# Patient Record
Sex: Male | Born: 1960 | Race: Black or African American | Hispanic: No | Marital: Married | Smoking: Current every day smoker
Health system: Southern US, Community
[De-identification: ages and names within clinical notes are randomized; demographics above are authoritative.]

## PROBLEM LIST (undated history)

## (undated) DIAGNOSIS — F32A Depression, unspecified: Secondary | ICD-10-CM

## (undated) DIAGNOSIS — H269 Unspecified cataract: Secondary | ICD-10-CM

## (undated) DIAGNOSIS — E11319 Type 2 diabetes mellitus with unspecified diabetic retinopathy without macular edema: Secondary | ICD-10-CM

## (undated) DIAGNOSIS — E119 Type 2 diabetes mellitus without complications: Secondary | ICD-10-CM

## (undated) DIAGNOSIS — F4310 Post-traumatic stress disorder, unspecified: Secondary | ICD-10-CM

## (undated) DIAGNOSIS — F419 Anxiety disorder, unspecified: Secondary | ICD-10-CM

## (undated) DIAGNOSIS — I509 Heart failure, unspecified: Secondary | ICD-10-CM

## (undated) DIAGNOSIS — M549 Dorsalgia, unspecified: Secondary | ICD-10-CM

## (undated) DIAGNOSIS — K859 Acute pancreatitis without necrosis or infection, unspecified: Secondary | ICD-10-CM

## (undated) DIAGNOSIS — B1920 Unspecified viral hepatitis C without hepatic coma: Secondary | ICD-10-CM

## (undated) DIAGNOSIS — I10 Essential (primary) hypertension: Secondary | ICD-10-CM

## (undated) DIAGNOSIS — I739 Peripheral vascular disease, unspecified: Secondary | ICD-10-CM

## (undated) DIAGNOSIS — E785 Hyperlipidemia, unspecified: Secondary | ICD-10-CM

## (undated) DIAGNOSIS — Z89511 Acquired absence of right leg below knee: Secondary | ICD-10-CM

## (undated) HISTORY — PX: CORONARY ANGIOPLASTY WITH STENT PLACEMENT: SHX49

---

## 2009-05-26 ENCOUNTER — Emergency Department (HOSPITAL_COMMUNITY): Admission: EM | Admit: 2009-05-26 | Discharge: 2009-05-26 | Payer: Self-pay | Admitting: Emergency Medicine

## 2013-08-04 ENCOUNTER — Emergency Department (HOSPITAL_BASED_OUTPATIENT_CLINIC_OR_DEPARTMENT_OTHER): Payer: Non-veteran care

## 2013-08-04 ENCOUNTER — Encounter (HOSPITAL_BASED_OUTPATIENT_CLINIC_OR_DEPARTMENT_OTHER): Payer: Self-pay | Admitting: Emergency Medicine

## 2013-08-04 ENCOUNTER — Emergency Department (HOSPITAL_BASED_OUTPATIENT_CLINIC_OR_DEPARTMENT_OTHER)
Admission: EM | Admit: 2013-08-04 | Discharge: 2013-08-04 | Disposition: A | Discharge status: Home / Self Care | Payer: Non-veteran care | Attending: Emergency Medicine | Admitting: Emergency Medicine

## 2013-08-04 DIAGNOSIS — R05 Cough: Secondary | ICD-10-CM | POA: Insufficient documentation

## 2013-08-04 DIAGNOSIS — Z794 Long term (current) use of insulin: Secondary | ICD-10-CM | POA: Insufficient documentation

## 2013-08-04 DIAGNOSIS — Z9861 Coronary angioplasty status: Secondary | ICD-10-CM | POA: Insufficient documentation

## 2013-08-04 DIAGNOSIS — R079 Chest pain, unspecified: Secondary | ICD-10-CM

## 2013-08-04 DIAGNOSIS — Z87891 Personal history of nicotine dependence: Secondary | ICD-10-CM | POA: Insufficient documentation

## 2013-08-04 DIAGNOSIS — Z8709 Personal history of other diseases of the respiratory system: Secondary | ICD-10-CM | POA: Insufficient documentation

## 2013-08-04 DIAGNOSIS — E663 Overweight: Secondary | ICD-10-CM | POA: Insufficient documentation

## 2013-08-04 DIAGNOSIS — R059 Cough, unspecified: Secondary | ICD-10-CM | POA: Insufficient documentation

## 2013-08-04 DIAGNOSIS — Z79899 Other long term (current) drug therapy: Secondary | ICD-10-CM | POA: Insufficient documentation

## 2013-08-04 DIAGNOSIS — Z7982 Long term (current) use of aspirin: Secondary | ICD-10-CM | POA: Insufficient documentation

## 2013-08-04 DIAGNOSIS — R071 Chest pain on breathing: Secondary | ICD-10-CM | POA: Insufficient documentation

## 2013-08-04 DIAGNOSIS — I2510 Atherosclerotic heart disease of native coronary artery without angina pectoris: Secondary | ICD-10-CM | POA: Insufficient documentation

## 2013-08-04 DIAGNOSIS — E119 Type 2 diabetes mellitus without complications: Secondary | ICD-10-CM | POA: Insufficient documentation

## 2013-08-04 HISTORY — DX: Type 2 diabetes mellitus without complications: E11.9

## 2013-08-04 LAB — BASIC METABOLIC PANEL
BUN: 10 mg/dL (ref 6–23)
CALCIUM: 9.6 mg/dL (ref 8.4–10.5)
CO2: 22 mEq/L (ref 19–32)
CREATININE: 0.7 mg/dL (ref 0.50–1.35)
Chloride: 105 mEq/L (ref 96–112)
GFR calc Af Amer: >90 mL/min (ref >90)
Glucose, Bld: 156 mg/dL — ABNORMAL HIGH (ref 70–99)
POTASSIUM: 4.1 meq/L (ref 3.7–5.3)
SODIUM: 142 meq/L (ref 137–147)

## 2013-08-04 LAB — CBC WITH DIFFERENTIAL/PLATELET
BASOS ABS: 0 10*3/uL (ref 0.0–0.1)
BASOS PCT: 1 % (ref 0–1)
EOS ABS: 0.2 10*3/uL (ref 0.0–0.7)
EOS PCT: 4 % (ref 0–5)
HCT: 47.7 % (ref 39.0–52.0)
Hemoglobin: 16.9 g/dL (ref 13.0–17.0)
Lymphocytes Relative: 47 % — ABNORMAL HIGH (ref 12–46)
Lymphs Abs: 3.1 10*3/uL (ref 0.7–4.0)
MCH: 33.5 pg (ref 26.0–34.0)
MCHC: 35.4 g/dL (ref 30.0–36.0)
MCV: 94.5 fL (ref 78.0–100.0)
MONO ABS: 0.5 10*3/uL (ref 0.1–1.0)
MONOS PCT: 8 % (ref 3–12)
Neutro Abs: 2.7 10*3/uL (ref 1.7–7.7)
Neutrophils Relative %: 42 % — ABNORMAL LOW (ref 43–77)
Platelets: 158 10*3/uL (ref 150–400)
RBC: 5.05 MIL/uL (ref 4.22–5.81)
RDW: 13.2 % (ref 11.5–15.5)
WBC: 6.6 10*3/uL (ref 4.0–10.5)

## 2013-08-04 LAB — TROPONIN I
Troponin I: <0.3 ng/mL (ref <0.30)
Troponin I: <0.3 ng/mL (ref <0.30)

## 2013-08-04 MED ORDER — IBUPROFEN 600 MG PO TABS: 600.0000 mg | ORAL_TABLET | Freq: Four times a day (QID) | ORAL | Status: DC | PRN

## 2013-08-04 MED ORDER — HYDROCODONE-ACETAMINOPHEN 5-325 MG PO TABS: 1.0000 | ORAL_TABLET | Freq: Four times a day (QID) | ORAL | Status: DC | PRN

## 2013-08-04 MED ORDER — ASPIRIN 81 MG PO CHEW
324.0000 mg | CHEWABLE_TABLET | Freq: Once | ORAL | Status: AC
  Administered 2013-08-04: 324 mg via ORAL
  Filled 2013-08-04: qty 4

## 2013-08-04 MED ORDER — KETOROLAC TROMETHAMINE 30 MG/ML IJ SOLN
30.0000 mg | Freq: Once | INTRAMUSCULAR | Status: AC
  Administered 2013-08-04: 30 mg via INTRAVENOUS
  Filled 2013-08-04: qty 1

## 2013-08-04 NOTE — ED Provider Notes (Signed)
CSN: [id number]     Arrival date & time 08/04/13  1540 History   First MD Initiated Contact with Patient 08/04/13 1609     Chief Complaint  Patient presents with  . Chest Pain     (Consider location/radiation/quality/duration/timing/severity/associated sxs/prior Treatment) HPI  This a [age] are old male with history of coronary artery disease status post stent placements x2, and diabetes who presents with chest pain. Patient reports 2-3 week history of constant anterior chest pain. Patient states that he was diagnosed with bronchitis and treated at the [facility]. He states that the chest pain is worse with movement, coughing, and laughing. There is no exertional component. He denies any shortness of breath.  Patient has not taken anything for the pain. He describes the pain as achy and nonradiating. Currently his pain is 4/10. He denies any history of blood clots or leg swelling.  Past Medical History  Diagnosis Date  . Diabetes mellitus without complication    Past Surgical History  Procedure Laterality Date  . Coronary angioplasty with stent placement     No family history on file. History  Substance Use Topics  . Smoking status: Former Games developermoker  . Smokeless tobacco: Not on file  . Alcohol Use: Yes    Review of Systems  Constitutional: Negative.  Negative for fever.  Respiratory: Positive for cough and chest tightness. Negative for shortness of breath.   Cardiovascular: Positive for chest pain. Negative for leg swelling.  Gastrointestinal: Negative.  Negative for nausea, vomiting and abdominal pain.  Genitourinary: Negative.  Negative for dysuria.  Musculoskeletal: Negative for back pain.  Neurological: Negative for headaches.  All other systems reviewed and are negative.     Allergies  Simvastatin  Home Medications   Prior to Admission medications   Medication Sig Start Date End Date Taking? Authorizing Provider  ALBUTEROL IN Inhale into the lungs.   Yes Historical Provider,  MD  aspirin 81 MG tablet Take 81 mg by mouth daily.   Yes Historical Provider, MD  BUPROPION HBR ER PO Take by mouth.   Yes Historical Provider, MD  CARVEDILOL PO Take by mouth.   Yes Historical Provider, MD  CHOLECALCIFEROL PO Take by mouth.   Yes Historical Provider, MD  HYDROcodone-acetaminophen (NORCO/VICODIN) 5-325 MG per tablet Take 1 tablet by mouth every 6 (six) hours as needed for moderate pain. 08/04/13   Shon Batonourtney F Horton, MD  ibuprofen (ADVIL,MOTRIN) 600 MG tablet Take 1 tablet (600 mg total) by mouth every 6 (six) hours as needed. 08/04/13   Shon Batonourtney F Horton, MD  insulin aspart (NOVOLOG) 100 UNIT/ML injection Inject into the skin 3 (three) times daily before meals.   Yes Historical Provider, MD  insulin glargine (LANTUS) 100 UNIT/ML injection Inject into the skin at bedtime.   Yes Historical Provider, MD  ketoconazole (NIZORAL) 2 % cream Apply 1 application topically daily.   Yes Historical Provider, MD  Ledipasvir-Sofosbuvir 90-400 MG TABS Take by mouth.   Yes Historical Provider, MD  lidocaine (XYLOCAINE) 5 % ointment Apply 1 application topically as needed.   Yes Historical Provider, MD  LOSARTAN POTASSIUM PO Take by mouth.   Yes Historical Provider, MD  MENTHOL-METHYL SALICYLATE EX Apply topically.   Yes Historical Provider, MD  METHOCARBAMOL PO Take by mouth.   Yes Historical Provider, MD  triamcinolone lotion (KENALOG) 0.1 % Apply 1 application topically 3 (three) times daily.   Yes Historical Provider, MD  ZOLPIDEM TARTRATE PO Take by mouth.   Yes Historical Provider, MD  BP 121/63  Pulse 85  Temp(Src) 98.4 F (36.9 C)  Resp 18  SpO2 96% Physical Exam  Nursing note and vitals reviewed. Constitutional: He is oriented to person, place, and time. He appears well-developed and well-nourished. No distress.  Overweight  HENT:  Head: Normocephalic and atraumatic.  Mouth/Throat: Oropharynx is clear and moist.  Eyes: Pupils are equal, round, and reactive to light.  Neck:  Neck supple. No JVD present.  Cardiovascular: Normal rate, regular rhythm and normal heart sounds.   No murmur heard. Pulmonary/Chest: Effort normal and breath sounds normal. No respiratory distress. He has no wheezes. He exhibits tenderness.  Tenderness to palpation over the anterior chest wall  Abdominal: Soft. Bowel sounds are normal. There is no tenderness. There is no rebound.  Musculoskeletal: He exhibits no edema.  Lymphadenopathy:    He has no cervical adenopathy.  Neurological: He is alert and oriented to person, place, and time.  Skin: Skin is warm and dry.  Psychiatric: He has a normal mood and affect.    ED Course  Procedures (including critical care time) Labs Review Labs Reviewed  CBC WITH DIFFERENTIAL - Abnormal; Notable for the following:    Neutrophils Relative % 42 (*)    Lymphocytes Relative 47 (*)    All other components within normal limits  BASIC METABOLIC PANEL - Abnormal; Notable for the following:    Glucose, Bld 156 (*)    All other components within normal limits  TROPONIN I  TROPONIN I    Imaging Review Dg Chest 2 View  08/04/2013   CLINICAL DATA:  Left chest pain. Upper extremity numbness and tingling.  EXAM: CHEST  2 VIEW  COMPARISON:  Multiple exams, including 06/29/2013  FINDINGS: Mildly enlarged cardiopericardial silhouette. Mild airway thickening. No airspace opacity observed. No pleural effusion.  IMPRESSION: 1. Airway thickening is present, suggesting bronchitis or reactive airways disease. 2. Mild cardiomegaly.   Electronically Signed   By: Herbie BaltimoreWalt  Liebkemann M.D.   On: 08/04/2013 17:04     EKG Interpretation   Date/Time:  Tuesday August 04 2013 15:48:28 EDT Ventricular Rate:  90 PR Interval:  130 QRS Duration: 89 QT Interval:  374 QTC Calculation: 458 R Axis:   77 Text Interpretation:  Sinus rhythm Minimal ST depression, inferior leads  no prior for comparison Confirmed by HORTON  MD, COURTNEY ([id number]) on  08/04/2013 4:27:55 PM       MDM   Final diagnoses:  Chest pain   Patient presents with chest pain. Duration 3 weeks and described as constant. Patient does have a history of coronary artery disease; however, story does not appear consistent with ACS. EKG shows no evidence of acute ischemia. Delta troponin is negative. Chest x-ray shows no evidence of pneumonia or pneumothorax. It does show airway thickening suggestive of bronchitis. Patient has no wheezing on exam. He reports recent recovery from bronchitis which could be consistent with this x-ray. Patient was given Toradol for pain. He has no lower extremity swelling and I have low suspicion for PE at this time given reproducible nature pain.  Vital signs remained reassuring. Discussed with patient use of anti-inflammatories for chest wall pain. Patient will be discharged with ibuprofen and Norco. Patient stated understanding. He is to followup closely with his primary care physician.  After history, exam, and medical workup I feel the patient has been appropriately medically screened and is safe for discharge home. Pertinent diagnoses were discussed with the patient. Patient was given return precautions.     Toni Amendourtney  Rexanne Mano, MD 08/04/13 [phone]

## 2013-08-04 NOTE — ED Notes (Signed)
C/o Cp x 2-3 weeks-[facility]-no tests and no meds

## 2013-08-04 NOTE — Discharge Instructions (Signed)
Chest Pain (Nonspecific) °It is often hard to give a specific diagnosis for the cause of chest pain. There is always a chance that your pain could be related to something serious, such as a heart attack or a blood clot in the lungs. You need to follow up with your caregiver for further evaluation. °CAUSES  °· Heartburn. °· Pneumonia or bronchitis. °· Anxiety or stress. °· Inflammation around your heart (pericarditis) or lung (pleuritis or pleurisy). °· A blood clot in the lung. °· A collapsed lung (pneumothorax). It can develop suddenly on its own (spontaneous pneumothorax) or from injury (trauma) to the chest. °· Shingles infection (herpes zoster virus). °The chest wall is composed of bones, muscles, and cartilage. Any of these can be the source of the pain. °· The bones can be bruised by injury. °· The muscles or cartilage can be strained by coughing or overwork. °· The cartilage can be affected by inflammation and become sore (costochondritis). °DIAGNOSIS  °Lab tests or other studies, such as X-rays, electrocardiography, stress testing, or cardiac imaging, may be needed to find the cause of your pain.  °TREATMENT  °· Treatment depends on what may be causing your chest pain. Treatment may include: °· Acid blockers for heartburn. °· Anti-inflammatory medicine. °· Pain medicine for inflammatory conditions. °· Antibiotics if an infection is present. °· You may be advised to change lifestyle habits. This includes stopping smoking and avoiding alcohol, caffeine, and chocolate. °· You may be advised to keep your head raised (elevated) when sleeping. This reduces the chance of acid going backward from your stomach into your esophagus. °· Most of the time, nonspecific chest pain will improve within 2 to 3 days with rest and mild pain medicine. °HOME CARE INSTRUCTIONS  °· If antibiotics were prescribed, take your antibiotics as directed. Finish them even if you start to feel better. °· For the next few days, avoid physical  activities that bring on chest pain. Continue physical activities as directed. °· Do not smoke. °· Avoid drinking alcohol. °· Only take over-the-counter or prescription medicine for pain, discomfort, or fever as directed by your caregiver. °· Follow your caregiver's suggestions for further testing if your chest pain does not go away. °· Keep any follow-up appointments you made. If you do not go to an appointment, you could develop lasting (chronic) problems with pain. If there is any problem keeping an appointment, you must call to reschedule. °SEEK MEDICAL CARE IF:  °· You think you are having problems from the medicine you are taking. Read your medicine instructions carefully. °· Your chest pain does not go away, even after treatment. °· You develop a rash with blisters on your chest. °SEEK IMMEDIATE MEDICAL CARE IF:  °· You have increased chest pain or pain that spreads to your arm, neck, jaw, back, or abdomen. °· You develop shortness of breath, an increasing cough, or you are coughing up blood. °· You have severe back or abdominal pain, feel nauseous, or vomit. °· You develop severe weakness, fainting, or chills. °· You have a fever. °THIS IS AN EMERGENCY. Do not wait to see if the pain will go away. Get medical help at once. Call your local emergency services (911 in [place] Do not drive yourself to the hospital. °MAKE SURE YOU:  °· Understand these instructions. °· Will watch your condition. °· Will get help right away if you are not doing well or get worse. °Document Released: 11/15/2004 Document Revised: 04/30/2011 Document Reviewed: 09/11/2007 °Exit[employer]® Patient Information ©2014 [employer]   [employer]. ° °

## 2019-04-20 ENCOUNTER — Ambulatory Visit: Payer: Non-veteran care | Attending: Internal Medicine

## 2019-04-20 DIAGNOSIS — Z23 Encounter for immunization: Secondary | ICD-10-CM | POA: Insufficient documentation

## 2019-04-20 NOTE — Progress Notes (Signed)
   Covid-19 Vaccination Clinic  Name:  Ricardo Howard    MRN: [id number] DOB: 03/23/1960  04/20/2019  [name] was observed post Covid-19 immunization for 15 minutes without incidence. He was provided with Vaccine Information Sheet and instruction to access the V-Safe system.   [name] was instructed to call 911 with any severe reactions post vaccine: Marland Kitchen Difficulty breathing  . Swelling of your face and throat  . A fast heartbeat  . A bad rash all over your body  . Dizziness and weakness    Immunizations Administered    Name Date Dose VIS Date Route   Pfizer COVID-19 Vaccine 04/20/2019  2:56 PM 0.3 mL 01/30/2019 Intramuscular   Manufacturer: [employer], [employer]   Lot: [id number]   NDC: [address]-4

## 2019-05-13 ENCOUNTER — Ambulatory Visit: Payer: Non-veteran care | Attending: Internal Medicine

## 2019-05-13 DIAGNOSIS — Z23 Encounter for immunization: Secondary | ICD-10-CM

## 2019-05-13 NOTE — Progress Notes (Signed)
   Covid-19 Vaccination Clinic  Name:  Ricardo Howard    MRN: [id number] DOB: Jan 23, 1961  05/13/2019  Ricardo Howard was observed post Covid-19 immunization for 15 minutes without incident. He was provided with Vaccine Information Sheet and instruction to access the V-Safe system.   Ricardo Howard was instructed to call 911 with any severe reactions post vaccine: Marland Kitchen Difficulty breathing  . Swelling of face and throat  . A fast heartbeat  . A bad rash all over body  . Dizziness and weakness   Immunizations Administered    Name Date Dose VIS Date Route   Pfizer COVID-19 Vaccine 05/13/2019  2:49 PM 0.3 mL 01/30/2019 Intramuscular   Manufacturer: [employer], [employer]   Lot: [id number]   NDC: [address]-8

## 2019-06-14 ENCOUNTER — Emergency Department (HOSPITAL_COMMUNITY): Payer: No Typology Code available for payment source

## 2019-06-14 ENCOUNTER — Encounter (HOSPITAL_COMMUNITY): Payer: Self-pay | Admitting: Emergency Medicine

## 2019-06-14 ENCOUNTER — Inpatient Hospital Stay (HOSPITAL_COMMUNITY)
Admission: EM | Admit: 2019-06-14 | Discharge: 2019-06-17 | DRG: 438 | Disposition: A | Discharge status: Home / Self Care | Payer: No Typology Code available for payment source | Attending: Internal Medicine | Admitting: Internal Medicine

## 2019-06-14 ENCOUNTER — Other Ambulatory Visit: Payer: Self-pay

## 2019-06-14 DIAGNOSIS — I10 Essential (primary) hypertension: Secondary | ICD-10-CM | POA: Diagnosis present

## 2019-06-14 DIAGNOSIS — Z79899 Other long term (current) drug therapy: Secondary | ICD-10-CM | POA: Diagnosis not present

## 2019-06-14 DIAGNOSIS — K8020 Calculus of gallbladder without cholecystitis without obstruction: Secondary | ICD-10-CM | POA: Diagnosis present

## 2019-06-14 DIAGNOSIS — T383X6A Underdosing of insulin and oral hypoglycemic [antidiabetic] drugs, initial encounter: Secondary | ICD-10-CM | POA: Diagnosis present

## 2019-06-14 DIAGNOSIS — E785 Hyperlipidemia, unspecified: Secondary | ICD-10-CM | POA: Diagnosis present

## 2019-06-14 DIAGNOSIS — E1142 Type 2 diabetes mellitus with diabetic polyneuropathy: Secondary | ICD-10-CM | POA: Diagnosis present

## 2019-06-14 DIAGNOSIS — Z7982 Long term (current) use of aspirin: Secondary | ICD-10-CM | POA: Diagnosis not present

## 2019-06-14 DIAGNOSIS — F1010 Alcohol abuse, uncomplicated: Secondary | ICD-10-CM

## 2019-06-14 DIAGNOSIS — Z955 Presence of coronary angioplasty implant and graft: Secondary | ICD-10-CM | POA: Diagnosis not present

## 2019-06-14 DIAGNOSIS — K852 Alcohol induced acute pancreatitis without necrosis or infection: Secondary | ICD-10-CM | POA: Diagnosis present

## 2019-06-14 DIAGNOSIS — I160 Hypertensive urgency: Secondary | ICD-10-CM | POA: Diagnosis present

## 2019-06-14 DIAGNOSIS — R112 Nausea with vomiting, unspecified: Secondary | ICD-10-CM | POA: Diagnosis present

## 2019-06-14 DIAGNOSIS — Z794 Long term (current) use of insulin: Secondary | ICD-10-CM

## 2019-06-14 DIAGNOSIS — R7989 Other specified abnormal findings of blood chemistry: Secondary | ICD-10-CM | POA: Diagnosis present

## 2019-06-14 DIAGNOSIS — Z20822 Contact with and (suspected) exposure to covid-19: Secondary | ICD-10-CM | POA: Diagnosis present

## 2019-06-14 DIAGNOSIS — E871 Hypo-osmolality and hyponatremia: Secondary | ICD-10-CM | POA: Diagnosis present

## 2019-06-14 DIAGNOSIS — D6959 Other secondary thrombocytopenia: Secondary | ICD-10-CM | POA: Diagnosis present

## 2019-06-14 DIAGNOSIS — K859 Acute pancreatitis without necrosis or infection, unspecified: Secondary | ICD-10-CM | POA: Diagnosis not present

## 2019-06-14 DIAGNOSIS — E876 Hypokalemia: Secondary | ICD-10-CM | POA: Diagnosis present

## 2019-06-14 DIAGNOSIS — K760 Fatty (change of) liver, not elsewhere classified: Secondary | ICD-10-CM | POA: Diagnosis present

## 2019-06-14 DIAGNOSIS — K7010 Alcoholic hepatitis without ascites: Secondary | ICD-10-CM | POA: Diagnosis present

## 2019-06-14 DIAGNOSIS — Z87891 Personal history of nicotine dependence: Secondary | ICD-10-CM | POA: Diagnosis not present

## 2019-06-14 DIAGNOSIS — E1110 Type 2 diabetes mellitus with ketoacidosis without coma: Secondary | ICD-10-CM | POA: Diagnosis present

## 2019-06-14 DIAGNOSIS — K8689 Other specified diseases of pancreas: Secondary | ICD-10-CM | POA: Diagnosis present

## 2019-06-14 HISTORY — DX: Alcohol abuse, uncomplicated: F10.10

## 2019-06-14 HISTORY — DX: Essential (primary) hypertension: I10

## 2019-06-14 LAB — CBC
HCT: 51.1 % (ref 39.0–52.0)
Hemoglobin: 17.2 g/dL — ABNORMAL HIGH (ref 13.0–17.0)
MCH: 32.8 pg (ref 26.0–34.0)
MCHC: 33.7 g/dL (ref 30.0–36.0)
MCV: 97.5 fL (ref 80.0–100.0)
Platelets: 159 10*3/uL (ref 150–400)
RBC: 5.24 MIL/uL (ref 4.22–5.81)
RDW: 13.6 % (ref 11.5–15.5)
WBC: 11.1 10*3/uL — ABNORMAL HIGH (ref 4.0–10.5)
nRBC: 0 % (ref 0.0–0.2)

## 2019-06-14 LAB — BASIC METABOLIC PANEL
Anion gap: 11 (ref 5–15)
Anion gap: 9 (ref 5–15)
BUN: 10 mg/dL (ref 6–20)
BUN: 8 mg/dL (ref 6–20)
CO2: 20 mmol/L — ABNORMAL LOW (ref 22–32)
CO2: 21 mmol/L — ABNORMAL LOW (ref 22–32)
Calcium: 8.3 mg/dL — ABNORMAL LOW (ref 8.9–10.3)
Calcium: 8.2 mg/dL — ABNORMAL LOW (ref 8.9–10.3)
Chloride: 108 mmol/L (ref 98–111)
Chloride: 105 mmol/L (ref 98–111)
Creatinine, Ser: 0.65 mg/dL (ref 0.61–1.24)
Creatinine, Ser: 0.63 mg/dL (ref 0.61–1.24)
GFR calc Af Amer: >60 mL/min (ref >60)
GFR calc Af Amer: >60 mL/min (ref >60)
GFR calc non Af Amer: >60 mL/min (ref >60)
GFR calc non Af Amer: >60 mL/min (ref >60)
Glucose, Bld: 98 mg/dL (ref 70–99)
Glucose, Bld: 147 mg/dL — ABNORMAL HIGH (ref 70–99)
Potassium: 4.1 mmol/L (ref 3.5–5.1)
Potassium: 3.5 mmol/L (ref 3.5–5.1)
Sodium: 139 mmol/L (ref 135–145)
Sodium: 135 mmol/L (ref 135–145)

## 2019-06-14 LAB — COMPREHENSIVE METABOLIC PANEL
ALT: 56 U/L — ABNORMAL HIGH (ref 0–44)
AST: 75 U/L — ABNORMAL HIGH (ref 15–41)
Albumin: 4 g/dL (ref 3.5–5.0)
Alkaline Phosphatase: 78 U/L (ref 38–126)
Anion gap: 20 — ABNORMAL HIGH (ref 5–15)
BUN: 13 mg/dL (ref 6–20)
CO2: 17 mmol/L — ABNORMAL LOW (ref 22–32)
Calcium: 9 mg/dL (ref 8.9–10.3)
Chloride: 98 mmol/L (ref 98–111)
Creatinine, Ser: 0.95 mg/dL (ref 0.61–1.24)
GFR calc Af Amer: >60 mL/min (ref >60)
GFR calc non Af Amer: >60 mL/min (ref >60)
Glucose, Bld: 355 mg/dL — ABNORMAL HIGH (ref 70–99)
Potassium: 4.5 mmol/L (ref 3.5–5.1)
Sodium: 135 mmol/L (ref 135–145)
Total Bilirubin: 2.3 mg/dL — ABNORMAL HIGH (ref 0.3–1.2)
Total Protein: 7.9 g/dL (ref 6.5–8.1)

## 2019-06-14 LAB — CBG MONITORING, ED
Glucose-Capillary: 112 mg/dL — ABNORMAL HIGH (ref 70–99)
Glucose-Capillary: 135 mg/dL — ABNORMAL HIGH (ref 70–99)
Glucose-Capillary: 151 mg/dL — ABNORMAL HIGH (ref 70–99)
Glucose-Capillary: 170 mg/dL — ABNORMAL HIGH (ref 70–99)
Glucose-Capillary: 175 mg/dL — ABNORMAL HIGH (ref 70–99)
Glucose-Capillary: 176 mg/dL — ABNORMAL HIGH (ref 70–99)
Glucose-Capillary: 180 mg/dL — ABNORMAL HIGH (ref 70–99)
Glucose-Capillary: 195 mg/dL — ABNORMAL HIGH (ref 70–99)
Glucose-Capillary: 276 mg/dL — ABNORMAL HIGH (ref 70–99)
Glucose-Capillary: 367 mg/dL — ABNORMAL HIGH (ref 70–99)
Glucose-Capillary: 90 mg/dL (ref 70–99)

## 2019-06-14 LAB — RESPIRATORY PANEL BY RT PCR (FLU A&B, COVID)
Influenza A by PCR: NEGATIVE
Influenza B by PCR: NEGATIVE
SARS Coronavirus 2 by RT PCR: NEGATIVE

## 2019-06-14 LAB — BETA-HYDROXYBUTYRIC ACID
Beta-Hydroxybutyric Acid: 1.55 mmol/L — ABNORMAL HIGH (ref 0.05–0.27)
Beta-Hydroxybutyric Acid: 2.6 mmol/L — ABNORMAL HIGH (ref 0.05–0.27)

## 2019-06-14 LAB — BLOOD GAS, VENOUS
Acid-base deficit: 7.6 mmol/L — ABNORMAL HIGH (ref 0.0–2.0)
Bicarbonate: 17.7 mmol/L — ABNORMAL LOW (ref 20.0–28.0)
O2 Saturation: 66.5 %
Patient temperature: 98.6
pCO2, Ven: 36.8 mmHg — ABNORMAL LOW (ref 44.0–60.0)
pH, Ven: 7.303 (ref 7.250–7.430)
pO2, Ven: 40.8 mmHg (ref 32.0–45.0)

## 2019-06-14 LAB — URINALYSIS, ROUTINE W REFLEX MICROSCOPIC
Bacteria, UA: NONE SEEN
Bilirubin Urine: NEGATIVE
Glucose, UA: >=500 mg/dL — AB
Ketones, ur: 80 mg/dL — AB
Leukocytes,Ua: NEGATIVE
Nitrite: NEGATIVE
Protein, ur: NEGATIVE mg/dL
Specific Gravity, Urine: >1.046 — ABNORMAL HIGH (ref 1.005–1.030)
pH: 5 (ref 5.0–8.0)

## 2019-06-14 LAB — LIPASE, BLOOD: Lipase: 453 U/L — ABNORMAL HIGH (ref 11–51)

## 2019-06-14 LAB — POC OCCULT BLOOD, ED: Fecal Occult Bld: NEGATIVE

## 2019-06-14 MED ORDER — INSULIN ASPART 100 UNIT/ML ~~LOC~~ SOLN
8.0000 [IU] | Freq: Once | SUBCUTANEOUS | Status: AC
  Administered 2019-06-14: 8 [IU] via SUBCUTANEOUS
  Filled 2019-06-14: qty 0.08

## 2019-06-14 MED ORDER — DEXTROSE-NACL 5-0.45 % IV SOLN: INTRAVENOUS | Status: DC

## 2019-06-14 MED ORDER — SODIUM CHLORIDE 0.9 % IV SOLN: INTRAVENOUS | Status: DC

## 2019-06-14 MED ORDER — ONDANSETRON HCL 4 MG/2ML IJ SOLN: 4.0000 mg | Freq: Four times a day (QID) | INTRAMUSCULAR | Status: DC | PRN

## 2019-06-14 MED ORDER — PANTOPRAZOLE SODIUM 40 MG IV SOLR
40.0000 mg | INTRAVENOUS | Status: DC
  Administered 2019-06-14 – 2019-06-17 (×3): 40 mg via INTRAVENOUS
  Filled 2019-06-14 (×3): qty 40

## 2019-06-14 MED ORDER — ONDANSETRON HCL 4 MG/2ML IJ SOLN
4.0000 mg | Freq: Once | INTRAMUSCULAR | Status: AC
  Administered 2019-06-14: 4 mg via INTRAVENOUS
  Filled 2019-06-14: qty 2

## 2019-06-14 MED ORDER — ONDANSETRON HCL 4 MG PO TABS: 4.0000 mg | ORAL_TABLET | Freq: Four times a day (QID) | ORAL | Status: DC | PRN

## 2019-06-14 MED ORDER — IOHEXOL 300 MG/ML  SOLN
100.0000 mL | Freq: Once | INTRAMUSCULAR | Status: AC | PRN
  Administered 2019-06-14: 100 mL via INTRAVENOUS

## 2019-06-14 MED ORDER — DIPHENHYDRAMINE HCL 50 MG/ML IJ SOLN
50.0000 mg | Freq: Once | INTRAMUSCULAR | Status: AC
  Administered 2019-06-14: 50 mg via INTRAVENOUS
  Filled 2019-06-14: qty 1

## 2019-06-14 MED ORDER — METOPROLOL TARTRATE 5 MG/5ML IV SOLN
5.0000 mg | INTRAVENOUS | Status: DC | PRN
  Administered 2019-06-14: 5 mg via INTRAVENOUS
  Filled 2019-06-14: qty 5

## 2019-06-14 MED ORDER — SODIUM CHLORIDE 0.9 % IV BOLUS
1000.0000 mL | Freq: Once | INTRAVENOUS | Status: AC
  Administered 2019-06-14: 1000 mL via INTRAVENOUS

## 2019-06-14 MED ORDER — INSULIN REGULAR(HUMAN) IN NACL 100-0.9 UT/100ML-% IV SOLN
INTRAVENOUS | Status: DC
  Administered 2019-06-14: 15 [IU]/h via INTRAVENOUS
  Filled 2019-06-14: qty 100

## 2019-06-14 MED ORDER — SODIUM CHLORIDE 0.9% FLUSH
3.0000 mL | Freq: Once | INTRAVENOUS | Status: AC
  Administered 2019-06-14: 3 mL via INTRAVENOUS

## 2019-06-14 MED ORDER — DEXTROSE 50 % IV SOLN: 0.0000 mL | INTRAVENOUS | Status: DC | PRN

## 2019-06-14 MED ORDER — MORPHINE SULFATE (PF) 4 MG/ML IV SOLN
4.0000 mg | Freq: Once | INTRAVENOUS | Status: AC
  Administered 2019-06-14: 4 mg via INTRAVENOUS
  Filled 2019-06-14: qty 1

## 2019-06-14 MED ORDER — HYDRALAZINE HCL 20 MG/ML IJ SOLN
5.0000 mg | INTRAMUSCULAR | Status: DC | PRN
  Administered 2019-06-14 – 2019-06-16 (×3): 5 mg via INTRAVENOUS
  Filled 2019-06-14 (×3): qty 1

## 2019-06-14 MED ORDER — MORPHINE SULFATE (PF) 2 MG/ML IV SOLN
2.0000 mg | INTRAVENOUS | Status: DC | PRN
  Administered 2019-06-14 – 2019-06-16 (×7): 2 mg via INTRAVENOUS
  Filled 2019-06-14 (×7): qty 1

## 2019-06-14 MED ORDER — INSULIN REGULAR(HUMAN) IN NACL 100-0.9 UT/100ML-% IV SOLN
INTRAVENOUS | Status: DC
  Administered 2019-06-14: 0.8 [IU]/h via INTRAVENOUS

## 2019-06-14 MED ORDER — SODIUM CHLORIDE (PF) 0.9 % IJ SOLN
INTRAMUSCULAR | Status: AC
  Filled 2019-06-14: qty 50

## 2019-06-14 MED ORDER — POTASSIUM CHLORIDE 10 MEQ/100ML IV SOLN: 10.0000 meq | INTRAVENOUS | Status: AC

## 2019-06-14 MED ORDER — POTASSIUM CHLORIDE 10 MEQ/100ML IV SOLN
10.0000 meq | INTRAVENOUS | Status: AC
  Administered 2019-06-14 (×2): 10 meq via INTRAVENOUS
  Filled 2019-06-14 (×2): qty 100

## 2019-06-14 MED ORDER — ENOXAPARIN SODIUM 40 MG/0.4ML ~~LOC~~ SOLN
40.0000 mg | SUBCUTANEOUS | Status: DC
Start: 2019-06-14 — End: 2019-06-14

## 2019-06-14 NOTE — ED Notes (Signed)
Endo tool recommends to continue the 3 units/hr rate.

## 2019-06-14 NOTE — ED Notes (Signed)
Patient was reminded that urine sample is needed

## 2019-06-14 NOTE — ED Notes (Signed)
Pt placed in hospital bed for additional comfort.

## 2019-06-14 NOTE — ED Notes (Signed)
Patient provided with ice water per request.  

## 2019-06-14 NOTE — H&P (Signed)
History and Physical    Ricardo Howard OQH:[id number] DOB: Jul 14, 1960 DOA: 06/14/2019  PCP: System, Pcp Not In   Patient coming from: Home  I have personally briefly reviewed patient's old medical records in [facility]  Chief Complaint: Vomiting and abdominal pain  HPI: Karlis Cregg is a 59 y.o. male with medical history significant of diabetes mellitus type II, hypertension, hyperlipidemia, alcohol abuse presented with abdominal pain for the last 3 to 4 days.  Patient states that he has been having gradual onset, progressively worsening, constant, diffuse upper abdominal pain for the last 3 to 4 days, 6-8 out of 10 in intensity, associated with nausea and intermittent vomiting with watery diarrhea.  He had some dark/black-colored stools earlier in the week however he had taken Pepto-Bismol.  No coffee-ground emesis and he is no longer having dark-colored stools.  He has not eaten anything for the last few days and has not taken any insulin.  No fevers, chills, chest pain, shortness of breath, dysuria, increased frequency of urination, loss of consciousness or seizures.  He states that he drinks 2 cans of 40 ounce beers every day but has not drank for the last 3 to 4 days.  ED Course: He was found to have hyperglycemia with bicarb of 17 and anion gap of 20.  He was started on IV fluids and insulin drip.  Lipase was 453.  CT of the abdomen and pelvis showed findings consistent with pancreatitis possible stone in pancreatic duct with pancreatic ductal dilatation however it was seen on previous imaging as well.  Hospitalist service was called to evaluate the patient  Review of Systems: As per HPI otherwise all other systems were reviewed and are negative.   Past Medical History:  Diagnosis Date  . Diabetes mellitus without complication [facility])     Past Surgical History:  Procedure Laterality Date  . CORONARY ANGIOPLASTY WITH STENT PLACEMENT     Social history  reports that he has quit  smoking. He does not have any smokeless tobacco history on file. He reports current alcohol use. He reports that he does not use drugs.  Allergies  Allergen Reactions  . Iohexol Hives    Patient broke out in hives after injection of Omni 300, will need 13 hour pre-med in future  . Lisinopril Cough  . Simvastatin Itching and Nausea Only    No history of cancer or TB  Prior to Admission medications   Medication Sig Start Date End Date Taking? Authorizing Provider  aspirin 81 MG tablet Take 81 mg by mouth daily.   Yes [provider]  atorvastatin (LIPITOR) 80 MG tablet Take 80 mg by mouth every evening.   Yes [provider]  buPROPion (WELLBUTRIN SR) 150 MG 12 hr tablet Take 150 mg by mouth in the morning and at bedtime.   Yes [provider]  carvedilol (COREG CR) 20 MG 24 hr capsule Take 20 mg by mouth daily.   Yes [provider]  gabapentin (NEURONTIN) 300 MG capsule Take 300 mg by mouth in the morning and at bedtime.   Yes [provider]  insulin aspart (NOVOLOG) 100 UNIT/ML injection Inject 64 Units into the skin in the morning.   Yes [provider]  losartan (COZAAR) 50 MG tablet Take 25 mg by mouth daily.   Yes [provider]  Multiple Vitamin (QUINTABS) TABS Take 1 tablet by mouth daily. 11/22/18  Yes [provider]  thiamine 100 MG tablet Take 100 mg by  mouth daily. 11/22/18  Yes [provider]  zolpidem (AMBIEN) 10 MG tablet Take 10 mg by mouth at bedtime as needed for sleep.   Yes [provider]  HYDROcodone-acetaminophen (NORCO/VICODIN) 5-325 MG per tablet Take 1 tablet by mouth every 6 (six) hours as needed for moderate pain. Patient not taking: Reported on 06/14/2019 08/04/13   Horton, Barbette Hair, MD  ibuprofen (ADVIL,MOTRIN) 600 MG tablet Take 1 tablet (600 mg total) by mouth every 6 (six) hours as needed. Patient not taking: Reported on 06/14/2019 08/04/13   Merryl Hacker, MD     Physical Exam: Vitals:   06/14/19 1345 06/14/19 1425 06/14/19 1430 06/14/19 1545  BP: (!) 184/154  (!) 167/105 (!) 161/104  Pulse: (!) 102  (!) 112 (!) 112  Resp: (!) 25  (!) 21 17  Temp:      TempSrc:      SpO2: 98%  97% 97%  Weight:  81.2 kg    Height:  5\' 8"  (1.727 m)      Constitutional: NAD, calm, comfortable Vitals:   06/14/19 1345 06/14/19 1425 06/14/19 1430 06/14/19 1545  BP: (!) 184/154  (!) 167/105 (!) 161/104  Pulse: (!) 102  (!) 112 (!) 112  Resp: (!) 25  (!) 21 17  Temp:      TempSrc:      SpO2: 98%  97% 97%  Weight:  81.2 kg    Height:  5\' 8"  (1.727 m)     Eyes: PERRL, lids and conjunctivae normal ENMT: Mucous membranes are dry. Posterior pharynx clear of any exudate or lesions. Neck: normal, supple, no masses, no thyromegaly Respiratory: bilateral decreased breath sounds at bases, no wheezing, no crackles.  Intermittently tachypneic. No accessory muscle use.  Cardiovascular: S1 S2 positive, tachycardic.  No extremity edema. 2+ pedal pulses.  Abdomen: Diffuse epigastric and periumbilical tenderness, no rebound tenderness; no masses palpated. No hepatosplenomegaly. Bowel sounds positive.  Musculoskeletal: no clubbing / cyanosis. No joint deformity upper and lower extremities.  Skin: no rashes, lesions, ulcers. No induration Neurologic: CN 2-12 grossly intact. Moving extremities. No focal neurologic deficits.  Psychiatric: Normal judgment and insight. Alert and oriented x 3. Normal mood.    Labs on Admission: I have personally reviewed following labs and imaging studies  CBC: Recent Labs  Lab 06/14/19 1252  WBC 11.1*  HGB 17.2*  HCT 51.1  MCV 97.5  PLT 301   Basic Metabolic Panel: Recent Labs  Lab 06/14/19 1252  NA 135  K 4.5  CL 98  CO2 17*  GLUCOSE 355*  BUN 13  CREATININE 0.95  CALCIUM 9.0   GFR: Estimated Creatinine Clearance: 80 mL/min (by C-G formula based on SCr of 0.95 mg/dL). Liver Function Tests: Recent Labs  Lab  06/14/19 1252  AST 75*  ALT 56*  ALKPHOS 78  BILITOT 2.3*  PROT 7.9  ALBUMIN 4.0   Recent Labs  Lab 06/14/19 1252  LIPASE 453*   No results for input(s): AMMONIA in the last 168 hours. Coagulation Profile: No results for input(s): INR, PROTIME in the last 168 hours. Cardiac Enzymes: No results for input(s): CKTOTAL, CKMB, CKMBINDEX, TROPONINI in the last 168 hours. BNP (last 3 results) No results for input(s): PROBNP in the last 8760 hours. HbA1C: No results for input(s): HGBA1C in the last 72 hours. CBG: Recent Labs  Lab 06/14/19 1240 06/14/19 1425 06/14/19 1524  GLUCAP 367* 276* 170*   Lipid Profile: No results for input(s): CHOL, HDL, LDLCALC, TRIG, CHOLHDL, LDLDIRECT in  the last 72 hours. Thyroid Function Tests: No results for input(s): TSH, T4TOTAL, FREET4, T3FREE, THYROIDAB in the last 72 hours. Anemia Panel: No results for input(s): VITAMINB12, FOLATE, FERRITIN, TIBC, IRON, RETICCTPCT in the last 72 hours. Urine analysis:    Component Value Date/Time   COLORURINE YELLOW 06/14/2019 0910   APPEARANCEUR CLEAR 06/14/2019 0910   LABSPEC >1.046 (H) 06/14/2019 0910   PHURINE 5.0 06/14/2019 0910   GLUCOSEU >=500 (A) 06/14/2019 0910   HGBUR SMALL (A) 06/14/2019 0910   BILIRUBINUR NEGATIVE 06/14/2019 0910   KETONESUR 80 (A) 06/14/2019 0910   PROTEINUR NEGATIVE 06/14/2019 0910   NITRITE NEGATIVE 06/14/2019 0910   LEUKOCYTESUR NEGATIVE 06/14/2019 0910    Radiological Exams on Admission: CT Abdomen Pelvis W Contrast  Result Date: 06/14/2019 CLINICAL DATA:  Nausea vomiting.  Pain. EXAM: CT ABDOMEN AND PELVIS WITH CONTRAST TECHNIQUE: Multidetector CT imaging of the abdomen and pelvis was performed using the standard protocol following bolus administration of intravenous contrast. CONTRAST:  OMNIPAQUE IOHEXOL 300 MG/ML  SOLN COMPARISON:  November 14, 2018 FINDINGS: Lower chest: No acute abnormality. Hepatobiliary: A low-attenuation lesion in the right hepatic  lobe on series 2, image 31 is stable and too small to characterize but probably a small cyst or hemangioma. No other liver lesions identified. The gallbladder is normal. The portal vein is patent. Pancreas: There is diffuse peripancreatic stranding. There is a calcification in the head of the pancreas which appears to be within the pancreatic duct. The pancreatic duct, just proximal to the calcification, is dilated but stable. No pancreatic abscess or necrosis is identified. No pancreatic fluid collections. Spleen: Normal in size without focal abnormality. Adrenals/Urinary Tract: Thickening of the adrenal glands is stable consistent with hyperplasia. Kidneys are normal in appearance. The ureters and bladder are normal. Stomach/Bowel: Stomach is within normal limits. Appendix appears normal. No evidence of bowel wall thickening, distention, or inflammatory changes. Vascular/Lymphatic: Significant atherosclerosis in the nonaneurysmal aorta. No adenopathy. Atherosclerosis extends into the iliac and femoral vessels. Reproductive: Prostate is unremarkable. Other: No abdominal wall hernia or abnormality. No abdominopelvic ascites. 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 IMPRESSION: 1. Pancreatitis. The pancreatitis may be secondary to the apparent stone within the pancreatic duct with adjacent pancreatic ductal dilatation. This stone and dilatation was present previously however. 2. Hepatic steatosis. 3. Adrenal hyperplasia. 4. Significant atherosclerosis in the nonaneurysmal aorta. 5. 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 Electronically Signed   By: Gerome Sam III M.D   On: 06/14/2019 14:59    EKG: Independently reviewed.  Sinus tachycardia with no ST elevations or depressions  Assessment/Plan  DKA in a patient with diabetes mellitus type 2 -Patient has not been taking his insulin for the last few days because of abdominal pain and vomiting -Presented with hyperglycemia and  anion gap of 20.  Currently on IV fluids and insulin drip. -Continue insulin drip.  Monitor BMP frequently.  Once blood sugars are below 250, change IV fluids to D5 half-normal saline. -Continue normal saline at 150 cc an hour currently.  Antiemetics as needed. -Diabetes coordinator consult.  Hemoglobin A1c in a.m.  Acute pancreatitis -CT of the abdomen and pelvis shows acute pancreatitis with possible pancreatic duct stone and pancreatic ductal dilatation.  However, the stone and dilatation was present previously -NPO.  IV fluids.  Pain management.  Lipid profile in a.m. -Spoke to Dr. Marjorie Smolder who will see the patient in consultation and recommended to continue current conservative management.  Acute transaminitis -Mostly  from alcohol abuse.  Monitor  Leukocytosis -Probably reactive from above.  Monitor  Alcohol abuse -Patient drinks 2 40 ounce cans of beer every day but has not drunk for the last 3 to 4 days.  Counseled regarding abstinence from alcohol.  Hypertension -Monitor blood pressure.  Use IV antihypertensives as needed.  Resume oral once taking orally   DVT prophylaxis: Lovenox Code Status: Full Family Communication: Spoke to patient at bedside Disposition Plan: Home in 2 to 3 days once clinically improved Consults called: GI/Dr. Levora Angel Admission status: Inpatient/stepdown  Severity of Illness: The appropriate patient status for this patient is INPATIENT. Inpatient status is judged to be reasonable and necessary in order to provide the required intensity of service to ensure the patient's safety. The patient's presenting symptoms, physical exam findings, and initial radiographic and laboratory data in the context of their chronic comorbidities is felt to place them at high risk for further clinical deterioration. Furthermore, it is not anticipated that the patient will be medically stable for discharge from the hospital within 2 midnights of admission. The following  factors support the patient status of inpatient.   " The patient's presenting symptoms include abdominal pain/vomiting. " The worrisome physical exam findings include abdominal tenderness. " The initial radiographic and laboratory data are worrisome because of DKA/acute pancreatitis on CT. " The chronic co-morbidities include alcohol abuse/hypertension.   * I certify that at the point of admission it is my clinical judgment that the patient will require inpatient hospital care spanning beyond 2 midnights from the point of admission due to high intensity of service, high risk for further deterioration and high frequency of surveillance required.Glade Lloyd MD [facility]  06/14/2019, 4:23 PM

## 2019-06-14 NOTE — ED Notes (Signed)
Lab called about missing BMP. Lab states the tube of blood is missing. Will recollect.

## 2019-06-14 NOTE — ED Triage Notes (Signed)
Pt c/o abd pains with n/v/d since Thursday.

## 2019-06-14 NOTE — ED Notes (Signed)
Pt given a urinal and made aware of need for urine sample.

## 2019-06-14 NOTE — ED Provider Notes (Signed)
[facility]-EMERGENCY DEPT Provider Note   CSN: [id number] Arrival date & time: 06/14/19  5615     History Chief Complaint  Patient presents with  . Abdominal Pain  . Emesis  . Diarrhea    Ricardo Howard is a 59 y.o. male with PMHx diabetes who presents to the ED with complaint of gradual onset, constant, worsening, diffuse upper abdominal pain x 3 days. Pt also complains of nausea, NBNB emesis, and watery diarrhea. He does mention to nursing staff that he had black colored stools earlier in the week however had taken peptobismal. No coffee ground emesis and he is no longer having dark colored stools. He does mention that he has not been taking his insulin or checking his sugars for the past 3 days with feeling bad. Pt denies any recent sick contacts or abx use. Denies fevers, chills, chest pain, SOB, urinary sx, or any other associated symptoms.   The history is provided by the patient and medical records.       Past Medical History:  Diagnosis Date  . Diabetes mellitus without complication (HCC)     There are no problems to display for this patient.   Past Surgical History:  Procedure Laterality Date  . CORONARY ANGIOPLASTY WITH STENT PLACEMENT         No family history on file.  Social History   Tobacco Use  . Smoking status: Former Smoker  Substance Use Topics  . Alcohol use: Yes  . Drug use: No    Home Medications Prior to Admission medications   Medication Sig Start Date End Date Taking? Authorizing Provider  aspirin 81 MG tablet Take 81 mg by mouth daily.   Yes [provider]  atorvastatin (LIPITOR) 80 MG tablet Take 80 mg by mouth every evening.   Yes [provider]  buPROPion (WELLBUTRIN SR) 150 MG 12 hr tablet Take 150 mg by mouth in the morning and at bedtime.   Yes [provider]  carvedilol (COREG CR) 20 MG 24 hr capsule Take 20 mg by mouth daily.   Yes [provider]  gabapentin (NEURONTIN)  300 MG capsule Take 300 mg by mouth in the morning and at bedtime.   Yes [provider]  insulin aspart (NOVOLOG) 100 UNIT/ML injection Inject 64 Units into the skin in the morning.   Yes [provider]  losartan (COZAAR) 50 MG tablet Take 25 mg by mouth daily.   Yes [provider]  Multiple Vitamin (QUINTABS) TABS Take 1 tablet by mouth daily. 11/22/18  Yes [provider]  thiamine 100 MG tablet Take 100 mg by mouth daily. 11/22/18  Yes [provider]  zolpidem (AMBIEN) 10 MG tablet Take 10 mg by mouth at bedtime as needed for sleep.   Yes [provider]  HYDROcodone-acetaminophen (NORCO/VICODIN) 5-325 MG per tablet Take 1 tablet by mouth every 6 (six) hours as needed for moderate pain. Patient not taking: Reported on 06/14/2019 08/04/13   Horton, Mayer Masker, MD  ibuprofen (ADVIL,MOTRIN) 600 MG tablet Take 1 tablet (600 mg total) by mouth every 6 (six) hours as needed. Patient not taking: Reported on 06/14/2019 08/04/13   Horton, Mayer Masker, MD    Allergies    Iohexol, Lisinopril, and Simvastatin  Review of Systems   Review of Systems  Constitutional: Negative for chills and fever.  Respiratory: Negative for shortness of breath.   Cardiovascular: Negative for chest pain.  Gastrointestinal: Positive for abdominal pain, diarrhea, nausea and  vomiting. Negative for constipation.  All other systems reviewed and are negative.   Physical Exam Updated Vital Signs BP (!) 166/115 (BP Location: Left Arm)   Pulse (!) 105   Temp 97.9 F (36.6 C) (Oral)   Resp (!) 22   SpO2 99%   Physical Exam Vitals and nursing note reviewed.  Constitutional:      Appearance: He is not ill-appearing or diaphoretic.  HENT:     Head: Normocephalic and atraumatic.  Eyes:     Conjunctiva/sclera: Conjunctivae normal.  Cardiovascular:     Rate and Rhythm: Regular rhythm. Tachycardia present.     Heart sounds: Normal heart sounds.  Pulmonary:     Breath  sounds: No wheezing, rhonchi or rales.     Comments: Mildly tachypneic Abdominal:     Palpations: Abdomen is soft.     Tenderness: There is abdominal tenderness.     Comments: Soft, diffuse abdominal TTP, +BS throughout, no r/g/r, neg murphy's, neg mcburney's, no CVA TTP  Genitourinary:    Rectum: Guaiac result negative.     Comments: Chaperone present for GU exam. No external hemorrhoids appreciated. Light brown/greenish stool on gloved finger. Guaiac negative.  Musculoskeletal:     Cervical back: Neck supple.  Skin:    General: Skin is warm and dry.  Neurological:     Mental Status: He is alert.     ED Results / Procedures / Treatments   Labs (all labs ordered are listed, but only abnormal results are displayed) Labs Reviewed  LIPASE, BLOOD - Abnormal; Notable for the following components:      Result Value   Lipase 453 (*)    All other components within normal limits  COMPREHENSIVE METABOLIC PANEL - Abnormal; Notable for the following components:   CO2 17 (*)    Glucose, Bld 355 (*)    AST 75 (*)    ALT 56 (*)    Total Bilirubin 2.3 (*)    Anion gap 20 (*)    All other components within normal limits  CBC - Abnormal; Notable for the following components:   WBC 11.1 (*)    Hemoglobin 17.2 (*)    All other components within normal limits  BLOOD GAS, VENOUS - Abnormal; Notable for the following components:   pCO2, Ven 36.8 (*)    Bicarbonate 17.7 (*)    Acid-base deficit 7.6 (*)    All other components within normal limits  CBG MONITORING, ED - Abnormal; Notable for the following components:   Glucose-Capillary 367 (*)    All other components within normal limits  CBG MONITORING, ED - Abnormal; Notable for the following components:   Glucose-Capillary 276 (*)    All other components within normal limits  RESPIRATORY PANEL BY RT PCR (FLU A&B, COVID)  URINALYSIS, ROUTINE W REFLEX MICROSCOPIC  BETA-HYDROXYBUTYRIC ACID  POC OCCULT BLOOD, ED     EKG None  Radiology CT Abdomen Pelvis W Contrast  Result Date: 06/14/2019 CLINICAL DATA:  Nausea vomiting.  Pain. EXAM: CT ABDOMEN AND PELVIS WITH CONTRAST TECHNIQUE: Multidetector CT imaging of the abdomen and pelvis was performed using the standard protocol following bolus administration of intravenous contrast. CONTRAST:  OMNIPAQUE IOHEXOL 300 MG/ML  SOLN COMPARISON:  November 14, 2018 FINDINGS: Lower chest: No acute abnormality. Hepatobiliary: A low-attenuation lesion in the right hepatic lobe on series 2, image 31 is stable and too small to characterize but probably a small cyst or hemangioma. No other liver lesions identified. The gallbladder is normal. The  portal vein is patent. Pancreas: There is diffuse peripancreatic stranding. There is a calcification in the head of the pancreas which appears to be within the pancreatic duct. The pancreatic duct, just proximal to the calcification, is dilated but stable. No pancreatic abscess or necrosis is identified. No pancreatic fluid collections. Spleen: Normal in size without focal abnormality. Adrenals/Urinary Tract: Thickening of the adrenal glands is stable consistent with hyperplasia. Kidneys are normal in appearance. The ureters and bladder are normal. Stomach/Bowel: Stomach is within normal limits. Appendix appears normal. No evidence of bowel wall thickening, distention, or inflammatory changes. Vascular/Lymphatic: Significant atherosclerosis in the nonaneurysmal aorta. No adenopathy. Atherosclerosis extends into the iliac and femoral vessels. Reproductive: Prostate is unremarkable. Other: No abdominal wall hernia or abnormality. No abdominopelvic ascites. 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 IMPRESSION: 1. Pancreatitis. The pancreatitis may be secondary to the apparent stone within the pancreatic duct with adjacent pancreatic ductal dilatation. This stone and dilatation was present previously however. 2. Hepatic  steatosis. 3. Adrenal hyperplasia. 4. Significant atherosclerosis in the nonaneurysmal aorta. 5. 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 Electronically Signed   By: Dorise Bullion III M.D   On: 06/14/2019 14:59    Procedures .Critical Care Performed by: Eustaquio Maize, PA-C Authorized by: Eustaquio Maize, PA-C   Critical care provider statement:    Critical care time (minutes):  45   Critical care was necessary to treat or prevent imminent or life-threatening deterioration of the following conditions:  Endocrine crisis   Critical care was time spent personally by me on the following activities:  Discussions with consultants, evaluation of patient's response to treatment, examination of patient, ordering and performing treatments and interventions, ordering and review of laboratory studies, ordering and review of radiographic studies, pulse oximetry, re-evaluation of patient's condition, obtaining history from patient or surrogate and review of old charts   (including critical care time)  Medications Ordered in ED Medications  sodium chloride (PF) 0.9 % injection (has no administration in time range)  insulin regular, human (MYXREDLIN) 100 units/ 100 mL infusion (15 Units/hr Intravenous New Bag/Given 06/14/19 1439)  0.9 %  sodium chloride infusion ( Intravenous New Bag/Given 06/14/19 1432)  dextrose 5 %-0.45 % sodium chloride infusion (has no administration in time range)  dextrose 50 % solution 0-50 mL (has no administration in time range)  potassium chloride 10 mEq in 100 mL IVPB (10 mEq Intravenous New Bag/Given 06/14/19 1434)  sodium chloride flush (NS) 0.9 % injection 3 mL (3 mLs Intravenous Given 06/14/19 1250)  sodium chloride 0.9 % bolus 1,000 mL (1,000 mLs Intravenous Bolus from Bag 06/14/19 1313)  ondansetron (ZOFRAN) injection 4 mg (4 mg Intravenous Given 06/14/19 1313)  insulin aspart (novoLOG) injection 8 Units (8 Units Subcutaneous Given 06/14/19 1313)  iohexol (OMNIPAQUE)  300 MG/ML solution 100 mL (100 mLs Intravenous Contrast Given 06/14/19 1350)  morphine 4 MG/ML injection 4 mg (4 mg Intravenous Given 06/14/19 1420)  diphenhydrAMINE (BENADRYL) injection 50 mg (50 mg Intravenous Given 06/14/19 1420)    ED Course  I have reviewed the triage vital signs and the nursing notes.  Pertinent labs & imaging results that were available during my care of the patient were reviewed by me and considered in my medical decision making (see chart for details).  Clinical Course as of Jun 14 1510  Sun Jun 14, 2019  1301 Glucose-Capillary(!): 367 [MV]  1307 WBC(!): 11.1 [MV]  1314 Fecal Occult Blood, POC: NEGATIVE [MV]  1356 Lipase(!): 453 [MV]  Clinical Course User Index [MV] Tanda Rockers, PA-C   MDM Rules/Calculators/A&P                      59 year old male presenting to the ED with upper abdominal pain, nausea, vomiting, diarrhea x 3 days. Has also not been taking his insulin at home; unsure what his sugars have been running. On arrival to the ED pt is afebrile and nontachypneic. He is noted to be tachycardic however. He has diffuse abdominal TTP however worse in the upper abdominal region. Suspect DKA given pt has not been taking his insulin however he reports the abdominal pain started first causing him not to take his insulin. Will work up for abd pain at this time with screening labs. Given diffuse abd TTP will obtain CT scan.   CBC with mild leukocytosis 11.1. Hgb stable however appears elevated likely s/2 dehydration.  CMP with glucose 355. Bicarb 17. Gap of 20.  VBG with normal pH 7.303, bicarb 17. Acid base deficit 7.6 consistent with DKA. Pt was given 1 L NS bolus and 8 units insulin prior to labs presenting. Will start on endotool. Will admit.   Lipase returned elevated at 453. Pt does endorse he typically drinks either a 6 pack of regular sized beer or #2 forty ounce beers per day.   When pt returned from CT scan he began having urticaria to his back; no  shortness of breath or angioedema. Posterior oropharynx without edema. Uvula midline. Benadryl given with relief.   CT scan with findings consistent with pancreatitis. Does show stone in pancreatic duct however it has been seen on previous imaging.   Discussed case with Dr. Hanley Ben who agrees to accept patient for admission.   This note was prepared using Dragon voice recognition software and may include unintentional dictation errors due to the inherent limitations of voice recognition software.  Final Clinical Impression(s) / ED Diagnoses Final diagnoses:  Alcohol-induced acute pancreatitis, unspecified complication status  Diabetic ketoacidosis without coma associated with type 2 diabetes mellitus [facility])    Rx / DC Orders ED Discharge Orders    None       Tanda Rockers, PA-C 06/14/19 1514    Benjiman Core, MD 06/15/19 1459

## 2019-06-14 NOTE — ED Notes (Signed)
Floor coverage messaged about pts status, and asked if glucose drip can be d/c.

## 2019-06-15 ENCOUNTER — Encounter (HOSPITAL_COMMUNITY): Payer: Self-pay | Admitting: Internal Medicine

## 2019-06-15 ENCOUNTER — Inpatient Hospital Stay (HOSPITAL_COMMUNITY): Payer: No Typology Code available for payment source

## 2019-06-15 DIAGNOSIS — E785 Hyperlipidemia, unspecified: Secondary | ICD-10-CM

## 2019-06-15 LAB — BASIC METABOLIC PANEL
Anion gap: 9 (ref 5–15)
Anion gap: 9 (ref 5–15)
BUN: 8 mg/dL (ref 6–20)
BUN: 8 mg/dL (ref 6–20)
CO2: 21 mmol/L — ABNORMAL LOW (ref 22–32)
CO2: 21 mmol/L — ABNORMAL LOW (ref 22–32)
Calcium: 8.3 mg/dL — ABNORMAL LOW (ref 8.9–10.3)
Calcium: 8.3 mg/dL — ABNORMAL LOW (ref 8.9–10.3)
Chloride: 105 mmol/L (ref 98–111)
Chloride: 103 mmol/L (ref 98–111)
Creatinine, Ser: 0.57 mg/dL — ABNORMAL LOW (ref 0.61–1.24)
Creatinine, Ser: 0.57 mg/dL — ABNORMAL LOW (ref 0.61–1.24)
GFR calc Af Amer: >60 mL/min (ref >60)
GFR calc Af Amer: >60 mL/min (ref >60)
GFR calc non Af Amer: >60 mL/min (ref >60)
GFR calc non Af Amer: >60 mL/min (ref >60)
Glucose, Bld: 155 mg/dL — ABNORMAL HIGH (ref 70–99)
Glucose, Bld: 170 mg/dL — ABNORMAL HIGH (ref 70–99)
Potassium: 3.5 mmol/L (ref 3.5–5.1)
Potassium: 3.3 mmol/L — ABNORMAL LOW (ref 3.5–5.1)
Sodium: 135 mmol/L (ref 135–145)
Sodium: 133 mmol/L — ABNORMAL LOW (ref 135–145)

## 2019-06-15 LAB — COMPREHENSIVE METABOLIC PANEL
ALT: 36 U/L (ref 0–44)
AST: 30 U/L (ref 15–41)
Albumin: 3.2 g/dL — ABNORMAL LOW (ref 3.5–5.0)
Alkaline Phosphatase: 56 U/L (ref 38–126)
Anion gap: 11 (ref 5–15)
BUN: 7 mg/dL (ref 6–20)
CO2: 18 mmol/L — ABNORMAL LOW (ref 22–32)
Calcium: 8.2 mg/dL — ABNORMAL LOW (ref 8.9–10.3)
Chloride: 104 mmol/L (ref 98–111)
Creatinine, Ser: 0.47 mg/dL — ABNORMAL LOW (ref 0.61–1.24)
GFR calc Af Amer: >60 mL/min (ref >60)
GFR calc non Af Amer: >60 mL/min (ref >60)
Glucose, Bld: 166 mg/dL — ABNORMAL HIGH (ref 70–99)
Potassium: 3.3 mmol/L — ABNORMAL LOW (ref 3.5–5.1)
Sodium: 133 mmol/L — ABNORMAL LOW (ref 135–145)
Total Bilirubin: 1.4 mg/dL — ABNORMAL HIGH (ref 0.3–1.2)
Total Protein: 6.5 g/dL (ref 6.5–8.1)

## 2019-06-15 LAB — LIPID PANEL
Cholesterol: 258 mg/dL — ABNORMAL HIGH (ref 0–200)
HDL: 44 mg/dL (ref >40)
LDL Cholesterol: 189 mg/dL — ABNORMAL HIGH (ref 0–99)
Total CHOL/HDL Ratio: 5.9 RATIO
Triglycerides: 127 mg/dL (ref <150)
VLDL: 25 mg/dL (ref 0–40)

## 2019-06-15 LAB — CBC
HCT: 44.3 % (ref 39.0–52.0)
Hemoglobin: 14.9 g/dL (ref 13.0–17.0)
MCH: 32.3 pg (ref 26.0–34.0)
MCHC: 33.6 g/dL (ref 30.0–36.0)
MCV: 96.1 fL (ref 80.0–100.0)
Platelets: 137 10*3/uL — ABNORMAL LOW (ref 150–400)
RBC: 4.61 MIL/uL (ref 4.22–5.81)
RDW: 13.5 % (ref 11.5–15.5)
WBC: 9.4 10*3/uL (ref 4.0–10.5)
nRBC: 0 % (ref 0.0–0.2)

## 2019-06-15 LAB — CBG MONITORING, ED
Glucose-Capillary: 124 mg/dL — ABNORMAL HIGH (ref 70–99)
Glucose-Capillary: 127 mg/dL — ABNORMAL HIGH (ref 70–99)
Glucose-Capillary: 134 mg/dL — ABNORMAL HIGH (ref 70–99)
Glucose-Capillary: 142 mg/dL — ABNORMAL HIGH (ref 70–99)
Glucose-Capillary: 144 mg/dL — ABNORMAL HIGH (ref 70–99)
Glucose-Capillary: 144 mg/dL — ABNORMAL HIGH (ref 70–99)
Glucose-Capillary: 156 mg/dL — ABNORMAL HIGH (ref 70–99)
Glucose-Capillary: 156 mg/dL — ABNORMAL HIGH (ref 70–99)
Glucose-Capillary: 161 mg/dL — ABNORMAL HIGH (ref 70–99)
Glucose-Capillary: 162 mg/dL — ABNORMAL HIGH (ref 70–99)
Glucose-Capillary: 168 mg/dL — ABNORMAL HIGH (ref 70–99)
Glucose-Capillary: 168 mg/dL — ABNORMAL HIGH (ref 70–99)
Glucose-Capillary: 172 mg/dL — ABNORMAL HIGH (ref 70–99)

## 2019-06-15 LAB — BETA-HYDROXYBUTYRIC ACID
Beta-Hydroxybutyric Acid: 1.32 mmol/L — ABNORMAL HIGH (ref 0.05–0.27)
Beta-Hydroxybutyric Acid: 0.81 mmol/L — ABNORMAL HIGH (ref 0.05–0.27)

## 2019-06-15 LAB — MAGNESIUM: Magnesium: 1.8 mg/dL (ref 1.7–2.4)

## 2019-06-15 LAB — GLUCOSE, CAPILLARY: Glucose-Capillary: 122 mg/dL — ABNORMAL HIGH (ref 70–99)

## 2019-06-15 LAB — HIV ANTIBODY (ROUTINE TESTING W REFLEX): HIV Screen 4th Generation wRfx: NONREACTIVE

## 2019-06-15 LAB — HEMOGLOBIN A1C
Hgb A1c MFr Bld: 11.1 % — ABNORMAL HIGH (ref 4.8–5.6)
Mean Plasma Glucose: 271.87 mg/dL

## 2019-06-15 MED ORDER — POTASSIUM CHLORIDE 10 MEQ/100ML IV SOLN
10.0000 meq | INTRAVENOUS | Status: AC
  Administered 2019-06-15 (×2): 10 meq via INTRAVENOUS
  Filled 2019-06-15 (×2): qty 100

## 2019-06-15 MED ORDER — OXYCODONE-ACETAMINOPHEN 5-325 MG PO TABS
1.0000 | ORAL_TABLET | Freq: Three times a day (TID) | ORAL | Status: DC | PRN
  Administered 2019-06-16 – 2019-06-17 (×3): 1 via ORAL
  Filled 2019-06-15 (×3): qty 1

## 2019-06-15 MED ORDER — INSULIN ASPART 100 UNIT/ML ~~LOC~~ SOLN
0.0000 [IU] | SUBCUTANEOUS | Status: DC
  Administered 2019-06-15: 1 [IU] via SUBCUTANEOUS
  Administered 2019-06-16: 2 [IU] via SUBCUTANEOUS
  Administered 2019-06-16: 1 [IU] via SUBCUTANEOUS
  Administered 2019-06-16: 3 [IU] via SUBCUTANEOUS
  Administered 2019-06-16: 2 [IU] via SUBCUTANEOUS
  Administered 2019-06-17: 1 [IU] via SUBCUTANEOUS
  Filled 2019-06-15: qty 0.09

## 2019-06-15 MED ORDER — GADOBUTROL 1 MMOL/ML IV SOLN
8.0000 mL | Freq: Once | INTRAVENOUS | Status: AC | PRN
  Administered 2019-06-15: 8 mL via INTRAVENOUS

## 2019-06-15 MED ORDER — INSULIN GLARGINE 100 UNIT/ML ~~LOC~~ SOLN
30.0000 [IU] | Freq: Two times a day (BID) | SUBCUTANEOUS | Status: DC
  Administered 2019-06-15 – 2019-06-16 (×4): 30 [IU] via SUBCUTANEOUS
  Filled 2019-06-15 (×5): qty 0.3

## 2019-06-15 MED ORDER — LABETALOL HCL 5 MG/ML IV SOLN
5.0000 mg | INTRAVENOUS | Status: DC | PRN
  Filled 2019-06-15: qty 4

## 2019-06-15 MED ORDER — ENOXAPARIN SODIUM 40 MG/0.4ML ~~LOC~~ SOLN
40.0000 mg | SUBCUTANEOUS | Status: DC
  Administered 2019-06-15 – 2019-06-16 (×2): 40 mg via SUBCUTANEOUS
  Filled 2019-06-15 (×2): qty 0.4

## 2019-06-15 MED ORDER — SODIUM CHLORIDE 0.9 % IV SOLN
2.0000 g | Freq: Three times a day (TID) | INTRAVENOUS | Status: DC
  Administered 2019-06-15 – 2019-06-16 (×3): 2 g via INTRAVENOUS
  Filled 2019-06-15 (×4): qty 2

## 2019-06-15 NOTE — ED Notes (Signed)
Just spoke with Dr Blake Divine who mentioned that she would discontinue the insulin drip at this time.

## 2019-06-15 NOTE — Consult Note (Addendum)
Referring Provider: Dr. Glade Lloyd Primary Care Physician:  System, Pcp Not In Primary Gastroenterologist:  Gentry Fitz  Reason for Consultation:  pancreatitis  HPI: Ricardo Howard is a 59 y.o. male with history of pancreatitis, DM type II, HTN, and daily alcohol use presenting with pancreatitis.  Patient notes epigastric abdominal pain starting Thursday 4/22.  The pain progressed over the last several days and he presented to the ED yesterday.  He notes some nausea/vomiting on Friday but this has resolved.  He notes a similar episode of pancreatitis approximately 1 year ago.  Patient notes poor appetite and states he typically drinks 2 large beers (24 oz) per day.  He endorses black, loose stools last week.  He was having up to 4 stools per day.  He took Pepto-Bismol but cannot recall if the black stools preceded the Pepto-Bismol use.  He has not had a stool since Saturday 4/25.  Patient denies hematemesis, dysphagia, heartburn, hematochezia.  He denies unexplained weight loss.  He denies family history of pancreatitis or gastrointestinal malignancies.  He had a colonoscopy many years ago.  He has never had an EGD.  He denies blood thinners or heavy NSAID use.  He takes Aleve as needed but not daily or weekly.  CT 06/14/19 showed pancreatitis and apparent stone within the pancreatic duct with adjacent pancreatic ductal dilatation (it was noted that stone and dilatation also present previously).  Hepatic steatosis also noted.  Past Medical History:  Diagnosis Date  . Diabetes mellitus without complication [facility])     Past Surgical History:  Procedure Laterality Date  . CORONARY ANGIOPLASTY WITH STENT PLACEMENT      Prior to Admission medications   Medication Sig Start Date End Date Taking? Authorizing Provider  aspirin 81 MG tablet Take 81 mg by mouth daily.   Yes [provider]  atorvastatin (LIPITOR) 80 MG tablet Take 80 mg by mouth every evening.   Yes [provider]   buPROPion (WELLBUTRIN SR) 150 MG 12 hr tablet Take 150 mg by mouth in the morning and at bedtime.   Yes [provider]  carvedilol (COREG CR) 20 MG 24 hr capsule Take 20 mg by mouth daily.   Yes [provider]  gabapentin (NEURONTIN) 300 MG capsule Take 300 mg by mouth in the morning and at bedtime.   Yes [provider]  insulin aspart (NOVOLOG) 100 UNIT/ML injection Inject 64 Units into the skin in the morning.   Yes [provider]  losartan (COZAAR) 50 MG tablet Take 25 mg by mouth daily.   Yes [provider]  Multiple Vitamin (QUINTABS) TABS Take 1 tablet by mouth daily. 11/22/18  Yes [provider]  thiamine 100 MG tablet Take 100 mg by mouth daily. 11/22/18  Yes [provider]  zolpidem (AMBIEN) 10 MG tablet Take 10 mg by mouth at bedtime as needed for sleep.   Yes [provider]  HYDROcodone-acetaminophen (NORCO/VICODIN) 5-325 MG per tablet Take 1 tablet by mouth every 6 (six) hours as needed for moderate pain. Patient not taking: Reported on 06/14/2019 08/04/13   Horton, Mayer Masker, MD  ibuprofen (ADVIL,MOTRIN) 600 MG tablet Take 1 tablet (600 mg total) by mouth every 6 (six) hours as needed. Patient not taking: Reported on 06/14/2019 08/04/13   Horton, Mayer Masker, MD    Scheduled Meds: . pantoprazole (PROTONIX) IV  40 mg Intravenous Q24H   Continuous Infusions: . sodium chloride 75 mL/hr at 06/14/19 1432  . sodium chloride    .  dextrose 5 % and 0.45% NaCl Stopped (06/14/19 1728)  . dextrose 5 % and 0.45% NaCl 75 mL/hr at 06/15/19 0411  . insulin 2 Units/hr (06/15/19 0723)   PRN Meds:.dextrose, hydrALAZINE, metoprolol tartrate, morphine injection, ondansetron **OR** ondansetron (ZOFRAN) IV  Allergies as of 06/14/2019 - Review Complete 06/14/2019  Allergen Reaction Noted  . Iohexol Hives 06/14/2019  . Lisinopril Cough 05/16/2011  . Simvastatin Itching and Nausea Only 08/04/2013    No family history on  file.  Social History   Socioeconomic History  . Marital status: Married    Spouse name: Not on file  . Number of children: Not on file  . Years of education: Not on file  . Highest education level: Not on file  Occupational History  . Not on file  Tobacco Use  . Smoking status: Former Smoker  Substance and Sexual Activity  . Alcohol use: Yes  . Drug use: No  . Sexual activity: Not on file  Other Topics Concern  . Not on file  Social History Narrative  . Not on file   Social Determinants of Health   Financial Resource Strain:   . Difficulty of Paying Living Expenses:   Food Insecurity:   . Worried About Programme researcher, broadcasting/film/video in the Last Year:   . Barista in the Last Year:   Transportation Needs:   . Freight forwarder (Medical):   Marland Kitchen Lack of Transportation (Non-Medical):   Physical Activity:   . Days of Exercise per Week:   . Minutes of Exercise per Session:   Stress:   . Feeling of Stress :   Social Connections:   . Frequency of Communication with Friends and Family:   . Frequency of Social Gatherings with Friends and Family:   . Attends Religious Services:   . Active Member of Clubs or Organizations:   . Attends Banker Meetings:   Marland Kitchen Marital Status:   Intimate Partner Violence:   . Fear of Current or Ex-Partner:   . Emotionally Abused:   Marland Kitchen Physically Abused:   . Sexually Abused:     Review of Systems: Review of Systems  Constitutional: Negative for chills, fever and weight loss.  HENT: Negative for hearing loss and tinnitus.   Eyes: Negative for blurred vision and pain.  Respiratory: Negative for cough and shortness of breath.   Cardiovascular: Negative for chest pain and palpitations.  Gastrointestinal: Positive for abdominal pain (epigastric), diarrhea and melena. Negative for blood in stool, constipation, heartburn, nausea and vomiting.  Genitourinary: Negative for dysuria and hematuria.  Musculoskeletal: Negative for back pain  and myalgias.  Neurological: Negative for seizures and loss of consciousness.  Endo/Heme/Allergies: Negative for polydipsia. Does not bruise/bleed easily.  Psychiatric/Behavioral: Positive for substance abuse. The patient is not nervous/anxious.     Physical Exam: Vital signs: Vitals:   06/15/19 0724 06/15/19 0800  BP: (!) 151/104 112/78  Pulse: 93 (!) 111  Resp: 17 (!) 21  Temp:    SpO2: 98% 95%     Physical Exam  Constitutional: He is oriented to person, place, and time. He appears well-developed and well-nourished. No distress.  HENT:  Head: Normocephalic and atraumatic.  Eyes: Conjunctivae and EOM are normal. No scleral icterus.  Cardiovascular: Normal rate, regular rhythm and normal heart sounds.  Pulmonary/Chest: Effort normal and breath sounds normal. No respiratory distress.  Abdominal: Soft. Bowel sounds are normal. He exhibits no mass. There is abdominal tenderness (moderate, epigastric). There is guarding. There is  no rebound.  Musculoskeletal:        General: No deformity or edema. Normal range of motion.     Cervical back: Normal range of motion and neck supple.  Neurological: He is alert and oriented to person, place, and time.  Skin: Skin is warm and dry.  Psychiatric: He has a normal mood and affect. His behavior is normal.    GI:  Lab Results: Recent Labs    06/14/19 1252 06/15/19 0412  WBC 11.1* 9.4  HGB 17.2* 14.9  HCT 51.1 44.3  PLT 159 137*   BMET Recent Labs    06/15/19 0020 06/15/19 0412 06/15/19 0820  NA 135 133* 133*  K 3.5 3.3* 3.3*  CL 105 104 103  CO2 21* 18* 21*  GLUCOSE 155* 166* 170*  BUN 8 7 8   CREATININE 0.57* 0.47* 0.57*  CALCIUM 8.3* 8.2* 8.3*   LFT Recent Labs    06/15/19 0412  PROT 6.5  ALBUMIN 3.2*  AST 30  ALT 36  ALKPHOS 56  BILITOT 1.4*   PT/INR No results for input(s): LABPROT, INR in the last 72 hours.   Studies/Results: CT Abdomen Pelvis W Contrast  Result Date: 06/14/2019 CLINICAL DATA:  Nausea  vomiting.  Pain. EXAM: CT ABDOMEN AND PELVIS WITH CONTRAST TECHNIQUE: Multidetector CT imaging of the abdomen and pelvis was performed using the standard protocol following bolus administration of intravenous contrast. CONTRAST:  140mL OMNIPAQUE IOHEXOL 300 MG/ML  SOLN COMPARISON:  November 14, 2018 FINDINGS: Lower chest: No acute abnormality. Hepatobiliary: A low-attenuation lesion in the right hepatic lobe on series 2, image 31 is stable and too small to characterize but probably a small cyst or hemangioma. No other liver lesions identified. The gallbladder is normal. The portal vein is patent. Pancreas: There is diffuse peripancreatic stranding. There is a calcification in the head of the pancreas which appears to be within the pancreatic duct. The pancreatic duct, just proximal to the calcification, is dilated but stable. No pancreatic abscess or necrosis is identified. No pancreatic fluid collections. Spleen: Normal in size without focal abnormality. Adrenals/Urinary Tract: Thickening of the adrenal glands is stable consistent with hyperplasia. Kidneys are normal in appearance. The ureters and bladder are normal. Stomach/Bowel: Stomach is within normal limits. Appendix appears normal. No evidence of bowel wall thickening, distention, or inflammatory changes. Vascular/Lymphatic: Significant atherosclerosis in the nonaneurysmal aorta. No adenopathy. Atherosclerosis extends into the iliac and femoral vessels. Reproductive: Prostate is unremarkable. Other: No abdominal wall hernia or abnormality. No abdominopelvic ascites. 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 IMPRESSION: 1. Pancreatitis. The pancreatitis may be secondary to the apparent stone within the pancreatic duct with adjacent pancreatic ductal dilatation. This stone and dilatation was present previously however. 2. Hepatic steatosis. 3. Adrenal hyperplasia. 4. Significant atherosclerosis in the nonaneurysmal aorta. 5. AVN in the  left femoral head without collapse. Electronically Signed   By: Dorise Bullion III M.D   On: 06/14/2019 14:59    Impression/Plan: Acute pancreatitis: pancreatitis on CT, epigastric abdominal pain, and lipase elevated to 453. -pancreatic duct stone with dilation on CT (CT at [facility] 10/2018 was unremarkable with no pancreatic ductal dilation) -daily ETOH use -Normal triglycerides -Pancreatic stone and ETOH use could be contributing to pancreatitis. -BUN 7/ Cr 0.47  Melena: unclear if this is related to pepto bismol use.  Patient has not had a bowel movement since Saturday.  Hgb stable at 14.9.  DKA: patient stopped taking insulin due to abdominal pain, N/V  Alcohol abuse: 2 large beers daily with decreased dietary intake  Plan: -MRCP to further evaluate pancreatic duct stone.   -Continue aggressive IVF -Pain control -Continue to monitor renal function and LFTs  Patient due for outpatient screening colonoscopy (last colonoscopy ~age 58)  [facility] will follow.   LOS: 1 day   Edrick Kins  PA-C 06/15/2019, 9:04 AM  Contact #  [phone]

## 2019-06-15 NOTE — Progress Notes (Signed)
PROGRESS NOTE    Ricardo Howard  WUJ:[id number] DOB: 03/16/1960 DOA: 06/14/2019 PCP: System, Pcp Not In   Chief Complaint  Patient presents with  . Abdominal Pain  . Emesis  . Diarrhea    Brief Narrative:  59 year old gentleman prior history of type 2 diabetes, hypertension, hyperlipidemia, alcohol abuse presents with abdominal pain associated with some nausea vomiting and watery diarrhea for 3 to 4 days.  On arrival to ED he was found to be in acute pancreatitis with elevated lipase levels of 453 and an acute DKA with elevated anion gap.  CT of the abdomen and pelvis shows findings consistent with acute pancreatitis, with a stone in pancreatic duct, pancreatic duct dilatation.  Patient referred to hospitalist service for admission and GI consulted for further evaluation.  Patient underwent MRCP showing Mild diffuse pancreatic inflammation consistent with known acute . Mild main pancreatic ductal dilatation in the midbody region and head down to a 7 mm calculus in the pancreatic head. Mild inflammation involving the descending colon and the third portion of the duodenum. Fatty infiltration of the liver but no focal hepatic lesions or intrahepatic biliary dilatation.   Assessment & Plan:   Principal Problem:   DKA (diabetic ketoacidoses) (HCC) Active Problems:   Acute pancreatitis   Alcohol abuse   Essential hypertension   Hyperlipidemia    DKA on arrival to ED patient's anion gap was 20.   Resolved now  Anion gap is closed, bicarb has improved to 21.  The   last  3 CBGs have been less than 200. Patient started on Lantus 30 units twice daily Hemoglobin A1c is 11.1. Continue with sliding scale insulin every 4 hours. Replace potassium, continue with gentle hydration with normal saline.    Acute pancreatitis probably secondary to combination of pancreatic stone/gallstone and alcohol abuse Improving but not resolved.  Check lipase levels. Liver enzymes have normalized.  Bilirubin  has decreased from 2.3-1.4.  MRCP shows acute pancreatitis and mild main pancreatic duct dilatation in the mid body and head down to a 7 mm calculus in the pancreatic head. GI consulted and will probably need ERCP. Currently patient is n.p.o.   Hypertensive urgency Blood pressure parameters not well controlled probably secondary to abdominal pain Labetalol as needed will be ordered.    Hyperlipidemia Lipid panel reviewed    Hypokalemia Replaced.   Mild hyponatremia probably secondary to hyperglycemia.    Mild thrombocytopenia Can monitor.  DVT prophylaxis: lovenox.  Code Status: Full code.  Family Communication: None at bedside.  Disposition:   Status is: Inpatient  Remains inpatient appropriate because:IV treatments appropriate due to intensity of illness or inability to take PO   Dispo: The patient is from: Home              Anticipated d/c is to: pending.               Anticipated d/c date is: 2 days              Patient currently is not medically stable to d/c.        Consultants:   Gastroenterology     Procedures: MRCP   Antimicrobials: IVE CEFEPIME.    Subjective: abd pain not well controlled.   Objective: Vitals:   06/15/19 1100 06/15/19 1229 06/15/19 1300 06/15/19 1400  BP: (!) 148/108 (!) 150/111 (!) 164/106 (!) 159/104  Pulse: 100 98 95 88  Resp: (!) 23 18 16 16   Temp:      TempSrc:  SpO2: 98% 100% 98% 99%  Weight:      Height:        Intake/Output Summary (Last 24 hours) at 06/15/2019 1441 Last data filed at 06/15/2019 6503 Gross per 24 hour  Intake 1350.95 ml  Output 600 ml  Net 750.95 ml   Filed Weights   06/14/19 1425  Weight: 81.2 kg    Examination:  General exam: Appears calm and comfortable  Respiratory system: Clear to auscultation. Respiratory effort normal. Cardiovascular system: S1 & S2 heard, RRR.  No pedal edema. Gastrointestinal system: Abdomen is nondistended, soft with tenderness in the  epigastric area and the umbilical area. .  Normal bowel sounds heard. Central nervous system: Alert and oriented. No focal neurological deficits. Extremities: Symmetric 5 x 5 power. Skin: No rashes, lesions or ulcers Psychiatry: . Mood & affect appropriate.     Data Reviewed: I have personally reviewed following labs and imaging studies  CBC: Recent Labs  Lab 06/14/19 1252 06/15/19 0412  WBC 11.1* 9.4  HGB 17.2* 14.9  HCT 51.1 44.3  MCV 97.5 96.1  PLT 159 137*    Basic Metabolic Panel: Recent Labs  Lab 06/14/19 1617 06/14/19 2020 06/15/19 0020 06/15/19 0412 06/15/19 0820  NA 139 135 135 133* 133*  K 4.1 3.5 3.5 3.3* 3.3*  CL 108 105 105 104 103  CO2 20* 21* 21* 18* 21*  GLUCOSE 98 147* 155* 166* 170*  BUN 10 8 8 7 8   CREATININE 0.65 0.63 0.57* 0.47* 0.57*  CALCIUM 8.3* 8.2* 8.3* 8.2* 8.3*  MG  --   --   --  1.8  --     GFR: Estimated Creatinine Clearance: 95 mL/min (A) (by C-G formula based on SCr of 0.57 mg/dL (L)).  Liver Function Tests: Recent Labs  Lab 06/14/19 1252 06/15/19 0412  AST 75* 30  ALT 56* 36  ALKPHOS 78 56  BILITOT 2.3* 1.4*  PROT 7.9 6.5  ALBUMIN 4.0 3.2*    CBG: Recent Labs  Lab 06/15/19 0503 06/15/19 0721 06/15/19 1001 06/15/19 1232 06/15/19 1344  GLUCAP 144* 161* 124* 172* 162*     Recent Results (from the past 240 hour(s))  Respiratory Panel by RT PCR (Flu A&B, Covid) - Nasopharyngeal Swab     Status: None   Collection Time: 06/14/19  1:43 PM   Specimen: Nasopharyngeal Swab  Result Value Ref Range Status   SARS Coronavirus 2 by RT PCR NEGATIVE NEGATIVE Final    Comment: (NOTE) SARS-CoV-2 target nucleic acids are NOT DETECTED. The SARS-CoV-2 RNA is generally detectable in upper respiratoy specimens during the acute phase of infection. The lowest concentration of SARS-CoV-2 viral copies this assay can detect is 131 copies/mL. A negative result does not preclude SARS-Cov-2 infection and should not be used as the sole  basis for treatment or other patient management decisions. A negative result may occur with  improper specimen collection/handling, submission of specimen other than nasopharyngeal swab, presence of viral mutation(s) within the areas targeted by this assay, and inadequate number of viral copies (<131 copies/mL). A negative result must be combined with clinical observations, patient history, and epidemiological information. The expected result is Negative. Fact Sheet for Patients:  06/16/19 Fact Sheet for Healthcare Providers:  https://www.moore.com/ This test is not yet ap proved or cleared by the https://www.young.biz/ FDA and  has been authorized for detection and/or diagnosis of SARS-CoV-2 by FDA under an Emergency Use Authorization (EUA). This EUA will remain  in effect (meaning this test can be  used) for the duration of the COVID-19 declaration under Section 564(b)(1) of the Act, 21 U.S.C. section 360bbb-3(b)(1), unless the authorization is terminated or revoked sooner.    Influenza A by PCR NEGATIVE NEGATIVE Final   Influenza B by PCR NEGATIVE NEGATIVE Final    Comment: (NOTE) The Xpert Xpress SARS-CoV-2/FLU/RSV assay is intended as an aid in  the diagnosis of influenza from Nasopharyngeal swab specimens and  should not be used as a sole basis for treatment. Nasal washings and  aspirates are unacceptable for Xpert Xpress SARS-CoV-2/FLU/RSV  testing. Fact Sheet for Patients: PinkCheek.be Fact Sheet for Healthcare Providers: GravelBags.it This test is not yet approved or cleared by the [place] FDA and  has been authorized for detection and/or diagnosis of SARS-CoV-2 by  FDA under an Emergency Use Authorization (EUA). This EUA will remain  in effect (meaning this test can be used) for the duration of the  Covid-19 declaration under Section 564(b)(1) of the Act, 21  U.S.C.  section 360bbb-3(b)(1), unless the authorization is  terminated or revoked. Performed at [facility], [place] [address], [place], [place] [address]          Radiology Studies: CT Abdomen Pelvis W Contrast  Result Date: 06/14/2019 CLINICAL DATA:  Nausea vomiting.  Pain. EXAM: CT ABDOMEN AND PELVIS WITH CONTRAST TECHNIQUE: Multidetector CT imaging of the abdomen and pelvis was performed using the standard protocol following bolus administration of intravenous contrast. CONTRAST:  122mL OMNIPAQUE IOHEXOL 300 MG/ML  SOLN COMPARISON:  November 14, 2018 FINDINGS: Lower chest: No acute abnormality. Hepatobiliary: A low-attenuation lesion in the right hepatic lobe on series 2, image 31 is stable and too small to characterize but probably a small cyst or hemangioma. No other liver lesions identified. The gallbladder is normal. The portal vein is patent. Pancreas: There is diffuse peripancreatic stranding. There is a calcification in the head of the pancreas which appears to be within the pancreatic duct. The pancreatic duct, just proximal to the calcification, is dilated but stable. No pancreatic abscess or necrosis is identified. No pancreatic fluid collections. Spleen: Normal in size without focal abnormality. Adrenals/Urinary Tract: Thickening of the adrenal glands is stable consistent with hyperplasia. Kidneys are normal in appearance. The ureters and bladder are normal. Stomach/Bowel: Stomach is within normal limits. Appendix appears normal. No evidence of bowel wall thickening, distention, or inflammatory changes. Vascular/Lymphatic: Significant atherosclerosis in the nonaneurysmal aorta. No adenopathy. Atherosclerosis extends into the iliac and femoral vessels. Reproductive: Prostate is unremarkable. Other: No abdominal wall hernia or abnormality. No abdominopelvic ascites. 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 IMPRESSION: 1. Pancreatitis. The pancreatitis may  be secondary to the apparent stone within the pancreatic duct with adjacent pancreatic ductal dilatation. This stone and dilatation was present previously however. 2. Hepatic steatosis. 3. Adrenal hyperplasia. 4. Significant atherosclerosis in the nonaneurysmal aorta. 5. 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 Electronically Signed   By: Dorise Bullion III M.D   On: 06/14/2019 14:59   MR 3D Recon At Scanner  Result Date: 06/15/2019 CLINICAL DATA:  Pancreatitis. EXAM: MRI ABDOMEN WITHOUT AND WITH CONTRAST (INCLUDING MRCP) TECHNIQUE: Multiplanar multisequence MR imaging of the abdomen was performed both before and after the administration of intravenous contrast. Heavily T2-weighted images of the biliary and pancreatic ducts were obtained, and three-dimensional MRCP images were rendered by post processing. CONTRAST:  30mL GADAVIST GADOBUTROL 1 MMOL/ML IV SOLN COMPARISON:  CT scan 06/14/2019 FINDINGS: Lower chest: The lung bases are clear of an  acute process. No pleural or pericardial effusion. Hepatobiliary: Geographic fatty infiltration of the liver but no focal hepatic lesions or intrahepatic biliary dilatation. The gallbladder is mildly contracted. No definite gallstones or findings for acute cholecystitis. Normal caliber and course of the common bile duct. Low insertion of the cystic duct. Pancreas: Mild diffuse pancreatic inflammation most notably in the body and tail region consistent with known acute pancreatitis. No complicating features such as pancreatic necrosis or pseudocyst. There is some fluid in the anterior pararenal space on the left side along with some fluid in the left pericolic gutter. Mild main pancreatic ductal dilatation in the midbody region and head down to a sizable calculus likely in the duct in the pancreatic head. This was also noticed on the recent CT scan and it measured approximately 7 mm. Maximum duct diameter is 7.5 mm. This calculus was also present on a prior CT scan  from 2020 but at that time measured only 3.5 mm and ductal dilatation was not as obvious. Spleen: Normal size. No focal lesions. Small amount of perisplenic fluid. Adrenals/Urinary Tract: The adrenal glands and kidneys are unremarkable. Stomach/Bowel: The stomach, duodenum, visualized small bowel and colon are grossly normal. There is some inflammatory change involving the descending colon near the splenic flexure related to the pancreatitis. Mild inflammation of the third portion of the duodenum is also noted. Vascular/Lymphatic: The aorta and branch vessels are patent. The major venous structures are patent. The splenic vein is patent. Other:  None Musculoskeletal: No significant bony findings. IMPRESSION: 1. Mild diffuse pancreatic inflammation consistent with known acute pancreatitis. No complicating features such as pancreatic necrosis or pseudocyst. 2. No gallstones or common bile duct stones. Normal caliber and course of the common bile duct. 3. Mild main pancreatic ductal dilatation in the midbody region and head down to a 7 mm calculus in the pancreatic head. This was also present on a prior CT scan from 2020 but at that time it measured 3.5 mm. 4. Mild inflammation involving the descending colon and the third portion of the duodenum. 5. Fatty infiltration of the liver but no focal hepatic lesions or intrahepatic biliary dilatation. Electronically Signed   By: Rudie Meyer M.D.   On: 06/15/2019 13:06   MR ABDOMEN MRCP W WO CONTAST  Result Date: 06/15/2019 CLINICAL DATA:  Pancreatitis. EXAM: MRI ABDOMEN WITHOUT AND WITH CONTRAST (INCLUDING MRCP) TECHNIQUE: Multiplanar multisequence MR imaging of the abdomen was performed both before and after the administration of intravenous contrast. Heavily T2-weighted images of the biliary and pancreatic ducts were obtained, and three-dimensional MRCP images were rendered by post processing. CONTRAST:  71mL GADAVIST GADOBUTROL 1 MMOL/ML IV SOLN COMPARISON:  CT scan  06/14/2019 FINDINGS: Lower chest: The lung bases are clear of an acute process. No pleural or pericardial effusion. Hepatobiliary: Geographic fatty infiltration of the liver but no focal hepatic lesions or intrahepatic biliary dilatation. The gallbladder is mildly contracted. No definite gallstones or findings for acute cholecystitis. Normal caliber and course of the common bile duct. Low insertion of the cystic duct. Pancreas: Mild diffuse pancreatic inflammation most notably in the body and tail region consistent with known acute pancreatitis. No complicating features such as pancreatic necrosis or pseudocyst. There is some fluid in the anterior pararenal space on the left side along with some fluid in the left pericolic gutter. Mild main pancreatic ductal dilatation in the midbody region and head down to a sizable calculus likely in the duct in the pancreatic head. This was also noticed on  the recent CT scan and it measured approximately 7 mm. Maximum duct diameter is 7.5 mm. This calculus was also present on a prior CT scan from 2020 but at that time measured only 3.5 mm and ductal dilatation was not as obvious. Spleen: Normal size. No focal lesions. Small amount of perisplenic fluid. Adrenals/Urinary Tract: The adrenal glands and kidneys are unremarkable. Stomach/Bowel: The stomach, duodenum, visualized small bowel and colon are grossly normal. There is some inflammatory change involving the descending colon near the splenic flexure related to the pancreatitis. Mild inflammation of the third portion of the duodenum is also noted. Vascular/Lymphatic: The aorta and branch vessels are patent. The major venous structures are patent. The splenic vein is patent. Other:  None Musculoskeletal: No significant bony findings. IMPRESSION: 1. Mild diffuse pancreatic inflammation consistent with known acute pancreatitis. No complicating features such as pancreatic necrosis or pseudocyst. 2. No gallstones or common bile duct  stones. Normal caliber and course of the common bile duct. 3. Mild main pancreatic ductal dilatation in the midbody region and head down to a 7 mm calculus in the pancreatic head. This was also present on a prior CT scan from 2020 but at that time it measured 3.5 mm. 4. Mild inflammation involving the descending colon and the third portion of the duodenum. 5. Fatty infiltration of the liver but no focal hepatic lesions or intrahepatic biliary dilatation. Electronically Signed   By: Rudie Meyer M.D.   On: 06/15/2019 13:06        Scheduled Meds: . insulin glargine  30 Units Subcutaneous BID  . pantoprazole (PROTONIX) IV  40 mg Intravenous Q24H   Continuous Infusions: . sodium chloride 75 mL/hr at 06/14/19 1432  . sodium chloride    . dextrose 5 % and 0.45% NaCl Stopped (06/14/19 1728)  . dextrose 5 % and 0.45% NaCl 75 mL/hr at 06/15/19 0411  . potassium chloride 10 mEq (06/15/19 1416)     LOS: 1 day        Kathlen Mody, MD [facility]   To contact the attending provider between 7A-7P or the covering provider during after hours 7P-7A, please log into the web site [email or web address] and access using universal [place] password for that web site. If you do not have the password, please call the hospital operator.  06/15/2019, 2:41 PM

## 2019-06-15 NOTE — ED Notes (Signed)
Endo tool recommends continuing  current insulin rate.

## 2019-06-15 NOTE — ED Notes (Signed)
Attempted to call report to [facility], nurse/floor unable to take patient at this time as patient remains on insulin drip. Charge nurse made aware.

## 2019-06-15 NOTE — ED Notes (Signed)
Patient transported to MRI 

## 2019-06-15 NOTE — ED Notes (Signed)
Floor coverage contacted about pts current status, and if potassium should be given. Recommend to continue current plan, but hold off on potassium until next BMP results.

## 2019-06-15 NOTE — ED Notes (Signed)
Report called to receiving nurse lkatherine for pt transfer to the floor

## 2019-06-15 NOTE — ED Notes (Signed)
Again asked floor coverage if IV potassium should be administered. Have yet to receive answer.

## 2019-06-15 NOTE — Progress Notes (Signed)
Pharmacy Antibiotic Note  Ricardo Howard is a 59 y.o. male admitted on 06/14/2019 with pancreatitis and apparent stone within the pancreatic duct with adjacent pancreatic ductal dilatation. Pharmacy has been consulted for Cefepime dosing.  Plan: Cefepime 2g IV q8h.  Monitor renal function, cultures, clinical course.   Height: 5\' 8"  (172.7 cm) Weight: 81.2 kg (179 lb) IBW/kg (Calculated) : 68.4  No data recorded.  Recent Labs  Lab 06/14/19 1252 06/14/19 1252 06/14/19 1617 06/14/19 2020 06/15/19 0020 06/15/19 0412 06/15/19 0820  WBC 11.1*  --   --   --   --  9.4  --   CREATININE 0.95   < > 0.65 0.63 0.57* 0.47* 0.57*   < > = values in this interval not displayed.    Estimated Creatinine Clearance: 95 mL/min (A) (by C-G formula based on SCr of 0.57 mg/dL (L)).    Allergies  Allergen Reactions  . Iohexol Hives    Patient broke out in hives after injection of Omni 300, will need 13 hour pre-med in future  . Lisinopril Cough  . Simvastatin Itching and Nausea Only    Antimicrobials this admission: 4/26 Cefepime >>  Dose adjustments this admission: --  Microbiology results: 4/25 COVID: negative 4/25 Influenza A/B: negative   Thank you for allowing pharmacy to be a part of this patient's care.  5/25 06/15/2019 2:58 PM

## 2019-06-16 LAB — CBC WITH DIFFERENTIAL/PLATELET
Abs Immature Granulocytes: 0.05 10*3/uL (ref 0.00–0.07)
Basophils Absolute: 0 10*3/uL (ref 0.0–0.1)
Basophils Relative: 0 %
Eosinophils Absolute: 0.2 10*3/uL (ref 0.0–0.5)
Eosinophils Relative: 2 %
HCT: 41.7 % (ref 39.0–52.0)
Hemoglobin: 14.4 g/dL (ref 13.0–17.0)
Immature Granulocytes: 1 %
Lymphocytes Relative: 17 %
Lymphs Abs: 1.4 10*3/uL (ref 0.7–4.0)
MCH: 32.6 pg (ref 26.0–34.0)
MCHC: 34.5 g/dL (ref 30.0–36.0)
MCV: 94.3 fL (ref 80.0–100.0)
Monocytes Absolute: 0.9 10*3/uL (ref 0.1–1.0)
Monocytes Relative: 12 %
Neutro Abs: 5.4 10*3/uL (ref 1.7–7.7)
Neutrophils Relative %: 68 %
Platelets: 126 10*3/uL — ABNORMAL LOW (ref 150–400)
RBC: 4.42 MIL/uL (ref 4.22–5.81)
RDW: 13.2 % (ref 11.5–15.5)
WBC: 8 10*3/uL (ref 4.0–10.5)
nRBC: 0 % (ref 0.0–0.2)

## 2019-06-16 LAB — LIPASE, BLOOD: Lipase: 72 U/L — ABNORMAL HIGH (ref 11–51)

## 2019-06-16 LAB — HEPATIC FUNCTION PANEL
ALT: 30 U/L (ref 0–44)
AST: 32 U/L (ref 15–41)
Albumin: 3 g/dL — ABNORMAL LOW (ref 3.5–5.0)
Alkaline Phosphatase: 54 U/L (ref 38–126)
Bilirubin, Direct: 0.2 mg/dL (ref 0.0–0.2)
Indirect Bilirubin: 0.8 mg/dL (ref 0.3–0.9)
Total Bilirubin: 1 mg/dL (ref 0.3–1.2)
Total Protein: 6.6 g/dL (ref 6.5–8.1)

## 2019-06-16 LAB — GLUCOSE, CAPILLARY
Glucose-Capillary: 102 mg/dL — ABNORMAL HIGH (ref 70–99)
Glucose-Capillary: 109 mg/dL — ABNORMAL HIGH (ref 70–99)
Glucose-Capillary: 137 mg/dL — ABNORMAL HIGH (ref 70–99)
Glucose-Capillary: 186 mg/dL — ABNORMAL HIGH (ref 70–99)
Glucose-Capillary: 189 mg/dL — ABNORMAL HIGH (ref 70–99)
Glucose-Capillary: 229 mg/dL — ABNORMAL HIGH (ref 70–99)

## 2019-06-16 LAB — BASIC METABOLIC PANEL
Anion gap: 9 (ref 5–15)
BUN: 5 mg/dL — ABNORMAL LOW (ref 6–20)
CO2: 21 mmol/L — ABNORMAL LOW (ref 22–32)
Calcium: 8.4 mg/dL — ABNORMAL LOW (ref 8.9–10.3)
Chloride: 105 mmol/L (ref 98–111)
Creatinine, Ser: 0.48 mg/dL — ABNORMAL LOW (ref 0.61–1.24)
GFR calc Af Amer: >60 mL/min (ref >60)
GFR calc non Af Amer: >60 mL/min (ref >60)
Glucose, Bld: 107 mg/dL — ABNORMAL HIGH (ref 70–99)
Potassium: 3.1 mmol/L — ABNORMAL LOW (ref 3.5–5.1)
Sodium: 135 mmol/L (ref 135–145)

## 2019-06-16 MED ORDER — LOSARTAN POTASSIUM 50 MG PO TABS
25.0000 mg | ORAL_TABLET | Freq: Every day | ORAL | Status: DC
  Administered 2019-06-16 – 2019-06-17 (×2): 25 mg via ORAL
  Filled 2019-06-16 (×2): qty 1

## 2019-06-16 MED ORDER — ASPIRIN 81 MG PO CHEW
81.0000 mg | CHEWABLE_TABLET | Freq: Every day | ORAL | Status: DC
  Administered 2019-06-16 – 2019-06-17 (×2): 81 mg via ORAL
  Filled 2019-06-16 (×2): qty 1

## 2019-06-16 MED ORDER — POTASSIUM CHLORIDE CRYS ER 20 MEQ PO TBCR
40.0000 meq | EXTENDED_RELEASE_TABLET | Freq: Once | ORAL | Status: DC
  Filled 2019-06-16: qty 2

## 2019-06-16 MED ORDER — POTASSIUM CHLORIDE CRYS ER 20 MEQ PO TBCR
40.0000 meq | EXTENDED_RELEASE_TABLET | Freq: Once | ORAL | Status: AC
  Administered 2019-06-16: 40 meq via ORAL
  Filled 2019-06-16: qty 2

## 2019-06-16 MED ORDER — GABAPENTIN 300 MG PO CAPS
300.0000 mg | ORAL_CAPSULE | Freq: Two times a day (BID) | ORAL | Status: DC
  Administered 2019-06-16 – 2019-06-17 (×2): 300 mg via ORAL
  Filled 2019-06-16 (×2): qty 1

## 2019-06-16 MED ORDER — ZOLPIDEM TARTRATE 10 MG PO TABS: 10.0000 mg | ORAL_TABLET | Freq: Every evening | ORAL | Status: DC | PRN

## 2019-06-16 MED ORDER — BUPROPION HCL ER (SR) 150 MG PO TB12
150.0000 mg | ORAL_TABLET | Freq: Every day | ORAL | Status: DC
  Administered 2019-06-16 – 2019-06-17 (×2): 150 mg via ORAL
  Filled 2019-06-16 (×2): qty 1

## 2019-06-16 MED ORDER — PANCRELIPASE (LIP-PROT-AMYL) 12000-38000 UNITS PO CPEP
36000.0000 [IU] | ORAL_CAPSULE | Freq: Three times a day (TID) | ORAL | Status: DC
  Administered 2019-06-16 – 2019-06-17 (×4): 36000 [IU] via ORAL
  Filled 2019-06-16 (×4): qty 3

## 2019-06-16 MED ORDER — CARVEDILOL PHOSPHATE ER 20 MG PO CP24
20.0000 mg | ORAL_CAPSULE | Freq: Every day | ORAL | Status: DC
  Administered 2019-06-16 – 2019-06-17 (×2): 20 mg via ORAL
  Filled 2019-06-16 (×2): qty 1

## 2019-06-16 MED ORDER — THIAMINE HCL 100 MG PO TABS
100.0000 mg | ORAL_TABLET | Freq: Every day | ORAL | Status: DC
  Administered 2019-06-16 – 2019-06-17 (×2): 100 mg via ORAL
  Filled 2019-06-16 (×2): qty 1

## 2019-06-16 MED ORDER — ATORVASTATIN CALCIUM 40 MG PO TABS
80.0000 mg | ORAL_TABLET | Freq: Every evening | ORAL | Status: DC
  Administered 2019-06-16: 80 mg via ORAL
  Filled 2019-06-16: qty 2

## 2019-06-16 NOTE — Progress Notes (Signed)
[facility] Progress Note  Ricardo Howard 59 y.o. [birth year]  CC:  Acute pancreatitis   Subjective: Patient reports continued epigastric abdominal pain.  He denies nausea or vomiting.  He has been tolerating a liquid diet.  ROS : Review of Systems  Constitutional: Negative for chills and fever.  Respiratory: Negative for shortness of breath.   Cardiovascular: Negative for chest pain.  Gastrointestinal: Positive for abdominal pain. Negative for blood in stool, constipation, diarrhea, heartburn, melena, nausea and vomiting.   Objective: Vital signs in last 24 hours: Vitals:   06/16/19 0848 06/16/19 1012  BP: (!) 141/102 (!) 148/97  Pulse: (!) 50 88  Resp: 17 16  Temp: 97.7 F (36.5 C) (!) 97.5 F (36.4 C)  SpO2: 97% 100%    Physical Exam:  General:  Alert, cooperative, no distress, appears stated age  Head:  Normocephalic, without obvious abnormality, atraumatic  Eyes:  Anicteric sclera, EOMs intact,   Lungs:   Clear to auscultation bilaterally, respirations unlabored  Heart:  Regular rate and rhythm, S1, S2 normal  Abdomen:   Soft, moderate epigastric tenderness with guarding, mildly sluggish bowel sounds   Extremities: Extremities normal, atraumatic, no  edema  Pulses: 2+ and symmetric    Lab Results: Recent Labs    06/15/19 0412 06/15/19 0412 06/15/19 0820 06/16/19 0532  NA 133*   < > 133* 135  K 3.3*   < > 3.3* 3.1*  CL 104   < > 103 105  CO2 18*   < > 21* 21*  GLUCOSE 166*   < > 170* 107*  BUN 7   < > 8 5*  CREATININE 0.47*   < > 0.57* 0.48*  CALCIUM 8.2*   < > 8.3* 8.4*  MG 1.8  --   --   --    < > = values in this interval not displayed.   Recent Labs    06/15/19 0412 06/16/19 0532  AST 30 32  ALT 36 30  ALKPHOS 56 54  BILITOT 1.4* 1.0  PROT 6.5 6.6  ALBUMIN 3.2* 3.0*   Recent Labs    06/15/19 0412 06/16/19 0532  WBC 9.4 8.0  NEUTROABS  --  5.4  HGB 14.9 14.4  HCT 44.3 41.7  MCV 96.1 94.3  PLT 137* 126*   No results for  input(s): LABPROT, INR in the last 72 hours.   Impression/Plan: Acute pancreatitis: pancreatitis on CT, epigastric abdominal pain, and lipase elevated to 453 on admission. -pancreatic duct stone with dilation per MRCP 4/27.  MRCP: 66mm calculus in pancreatic head (increased from 3.26mm in 2020).  Pancreatitis without necrosis or pseudocyst. -daily ETOH use -Normal triglycerides -Pancreatic stone and ETOH use could be contributing to pancreatitis. -LFTs remain within normal limits -Renal function remains normal: BUN 5/ Cr 0.48  Melena: unclear if this is related to pepto bismol use.  Patient has not had melenic bowel movements during admission.  Hgb stable at 14.4.  Alcohol abuse: 2 large beers daily with decreased dietary intake  Plan: -Continue aggressive IVF -Pain control -Discussed MRCP findings.  We will defer ERCP with pancreatic stone removal at this time.  If patient remains abstinent from alcohol and has another episode of pancreatitis, consider ERCP at that time. -Continued education on alcohol cessation was discussed with patient.  Patient due for outpatient screening colonoscopy (last colonoscopy ~age 53)  [facility] will follow.   Edrick Kins PA-C 06/16/2019, 11:18 AM  Contact #  [phone]

## 2019-06-16 NOTE — Progress Notes (Signed)
PROGRESS NOTE    Ricardo Howard  GEZ:[id number] DOB: [birth year] DOA: 06/14/2019 PCP: System, Pcp Not In   Chief Complaint  Patient presents with  . Abdominal Pain  . Emesis  . Diarrhea    Brief Narrative:  59 year old gentleman prior history of type 2 diabetes, hypertension, hyperlipidemia, alcohol abuse presents with abdominal pain associated with some nausea vomiting and watery diarrhea for 3 to 4 days.  On arrival to ED he was found to be in acute pancreatitis with elevated lipase levels of 453 and an acute DKA with elevated anion gap.  CT of the abdomen and pelvis shows findings consistent with acute pancreatitis, with a stone in pancreatic duct, pancreatic duct dilatation.  Patient referred to hospitalist service for admission and GI consulted for further evaluation.  Patient underwent MRCP showing Mild diffuse pancreatic inflammation consistent with known acute . Mild main pancreatic ductal dilatation in the midbody region and head down to a 7 mm calculus in the pancreatic head. Mild inflammation involving the descending colon and the third portion of the duodenum. Fatty infiltration of the liver but no focal hepatic lesions or intrahepatic biliary dilatation.  GI recommends supportive management with low-fat meals, pain control, alcohol rehabilitation and pancreatic enzymes. Patient seen and examined at bedside reports abdominal pain has improved but not resolved yet.   Assessment & Plan:   Principal Problem:   DKA (diabetic ketoacidoses) (HCC) Active Problems:   Acute pancreatitis   Alcohol abuse   Essential hypertension   Hyperlipidemia    DKA  on arrival to ED patient's anion gap was 20.   Resolved now.  Anion gap is closed, bicarb has improved to 21.  He was transitioned off the insulin gtt. and was started on Lantus 30 units twice daily. Hemoglobin A1c is 11.1. Continue with sliding scale insulin every 4 hours. CBG (last 3)  Recent Labs    06/16/19 0402 06/16/19 0730  06/16/19 1216  GLUCAP 102* 109* 189*       Acute pancreatitis probably secondary to combination of pancreatic stone/gallstone and alcohol abuse I abdominal pain has improved but not resolved yet.  Lipase is 72 improved from 453.  Liver enzymes are within normal limits.  Bilirubin has normalized. MRCP shows acute pancreatitis and mild main pancreatic duct dilatation in the mid body and head down to a 7 mm calculus in the pancreatic head. GI consulted , deferred ERCP with pancreatic stone removal at this time.  They suggest that patient should remain abstinent from alcohol. Patient was started on clear liquid diet and advanced as tolerated. Symptomatic management with IV antiemetics, IV pain control and IV fluids.   Hypertensive urgency Improving blood pressure parameters.   Restart home medications.     Hyperlipidemia Lipid panel shows cholesterol of 258, LDL of 189, HDL of 42.  Resume lipitor.    Peripheral neuropathy:  Resume gabapentin.    Hypokalemia Replaced.   Mild hyponatremia  Resolved with fluids.     Mild thrombocytopenia Platelets of 126000 probably secondary to alcohol abuse.  Continue to monitor   Alcohol abuse; No signs of withdrawal at this time.  SW consulted for resources for alcohol abstinence.   DVT prophylaxis: lovenox.  Code Status: Full code.  Family Communication: None at bedside.  Disposition:   Status is: Inpatient  Remains inpatient appropriate because:IV treatments appropriate due to intensity of illness or inability to take PO   Dispo: The patient is from: Home  Anticipated d/c is to: pending.               Anticipated d/c date is: 1 day              Patient currently is not medically stable to d/c.        Consultants:   Gastroenterology     Procedures: MRCP   Antimicrobials:  Anti-infectives (From admission, onward)   Start     Dose/Rate Route Frequency Ordered Stop   06/15/19 1500  ceFEPIme  (MAXIPIME) 2 g in sodium chloride 0.9 % 100 mL IVPB     2 g 200 mL/hr over 30 Minutes Intravenous Every 8 hours 06/15/19 1457            Subjective:  Nausea is better, no vomiting, abd pain has improved but not resolved.    Objective: Vitals:   06/16/19 0404 06/16/19 0848 06/16/19 1012 06/16/19 1430  BP: (!) 145/95 (!) 141/102 (!) 148/97 (!) 143/98  Pulse: 87 (!) 50 88 84  Resp: 16 17 16 17   Temp: 98.2 F (36.8 C) 97.7 F (36.5 C) (!) 97.5 F (36.4 C) 98 F (36.7 C)  TempSrc: Oral Oral Oral Oral  SpO2: 97% 97% 100% 100%  Weight:      Height:        Intake/Output Summary (Last 24 hours) at 06/16/2019 1531 Last data filed at 06/16/2019 1100 Gross per 24 hour  Intake 245.84 ml  Output 875 ml  Net -629.16 ml   Filed Weights   06/14/19 1425  Weight: 81.2 kg    Examination:  General exam: Alert and comfortable, not in any kind of distress. Respiratory system: Clear to auscultation bilaterally no wheezing or rhonchi Cardiovascular system: S1-S2 heard, regular rate rhythm, no JVD, no pedal edema  gastrointestinal system: Abdomen is soft, generalized tenderness present, no rebound tenderness, no distention, bowel sounds normal Central nervous system: Alert and oriented, grossly nonfocal. Extremities: No cyanosis or clubbing Skin: No rashes seen Psychiatry: .  Mood is appropriate    Data Reviewed: I have personally reviewed following labs and imaging studies  CBC: Recent Labs  Lab 06/14/19 1252 06/15/19 0412 06/16/19 0532  WBC 11.1* 9.4 8.0  NEUTROABS  --   --  5.4  HGB 17.2* 14.9 14.4  HCT 51.1 44.3 41.7  MCV 97.5 96.1 94.3  PLT 159 137* 126*    Basic Metabolic Panel: Recent Labs  Lab 06/14/19 2020 06/15/19 0020 06/15/19 0412 06/15/19 0820 06/16/19 0532  NA 135 135 133* 133* 135  K 3.5 3.5 3.3* 3.3* 3.1*  CL 105 105 104 103 105  CO2 21* 21* 18* 21* 21*  GLUCOSE 147* 155* 166* 170* 107*  BUN 8 8 7 8  5*  CREATININE 0.63 0.57* 0.47* 0.57* 0.48*   CALCIUM 8.2* 8.3* 8.2* 8.3* 8.4*  MG  --   --  1.8  --   --     GFR: Estimated Creatinine Clearance: 95 mL/min (A) (by C-G formula based on SCr of 0.48 mg/dL (L)).  Liver Function Tests: Recent Labs  Lab 06/14/19 1252 06/15/19 0412 06/16/19 0532  AST 75* 30 32  ALT 56* 36 30  ALKPHOS 78 56 54  BILITOT 2.3* 1.4* 1.0  PROT 7.9 6.5 6.6  ALBUMIN 4.0 3.2* 3.0*    CBG: Recent Labs  Lab 06/15/19 2043 06/16/19 0009 06/16/19 0402 06/16/19 0730 06/16/19 1216  GLUCAP 122* 137* 102* 109* 189*     Recent Results (from the past 240 hour(s))  Respiratory Panel by RT PCR (Flu A&B, Covid) - Nasopharyngeal Swab     Status: None   Collection Time: 06/14/19  1:43 PM   Specimen: Nasopharyngeal Swab  Result Value Ref Range Status   SARS Coronavirus 2 by RT PCR NEGATIVE NEGATIVE Final    Comment: (NOTE) SARS-CoV-2 target nucleic acids are NOT DETECTED. The SARS-CoV-2 RNA is generally detectable in upper respiratoy specimens during the acute phase of infection. The lowest concentration of SARS-CoV-2 viral copies this assay can detect is 131 copies/mL. A negative result does not preclude SARS-Cov-2 infection and should not be used as the sole basis for treatment or other patient management decisions. A negative result may occur with  improper specimen collection/handling, submission of specimen other than nasopharyngeal swab, presence of viral mutation(s) within the areas targeted by this assay, and inadequate number of viral copies (<131 copies/mL). A negative result must be combined with clinical observations, patient history, and epidemiological information. The expected result is Negative. Fact Sheet for Patients:  https://www.moore.com/ Fact Sheet for Healthcare Providers:  https://www.young.biz/ This test is not yet ap proved or cleared by the [place] FDA and  has been authorized for detection and/or diagnosis of SARS-CoV-2 by FDA  under an Emergency Use Authorization (EUA). This EUA will remain  in effect (meaning this test can be used) for the duration of the COVID-19 declaration under Section 564(b)(1) of the Act, 21 U.S.C. section 360bbb-3(b)(1), unless the authorization is terminated or revoked sooner.    Influenza A by PCR NEGATIVE NEGATIVE Final   Influenza B by PCR NEGATIVE NEGATIVE Final    Comment: (NOTE) The Xpert Xpress SARS-CoV-2/FLU/RSV assay is intended as an aid in  the diagnosis of influenza from Nasopharyngeal swab specimens and  should not be used as a sole basis for treatment. Nasal washings and  aspirates are unacceptable for Xpert Xpress SARS-CoV-2/FLU/RSV  testing. Fact Sheet for Patients: https://www.moore.com/ Fact Sheet for Healthcare Providers: https://www.young.biz/ This test is not yet approved or cleared by the [place] FDA and  has been authorized for detection and/or diagnosis of SARS-CoV-2 by  FDA under an Emergency Use Authorization (EUA). This EUA will remain  in effect (meaning this test can be used) for the duration of the  Covid-19 declaration under Section 564(b)(1) of the Act, 21  U.S.C. section 360bbb-3(b)(1), unless the authorization is  terminated or revoked. Performed at [facility], [address], [place], [place] [address]          Radiology Studies: MR 3D Recon At Scanner  Result Date: 06/15/2019 CLINICAL DATA:  Pancreatitis. EXAM: MRI ABDOMEN WITHOUT AND WITH CONTRAST (INCLUDING MRCP) TECHNIQUE: Multiplanar multisequence MR imaging of the abdomen was performed both before and after the administration of intravenous contrast. Heavily T2-weighted images of the biliary and pancreatic ducts were obtained, and three-dimensional MRCP images were rendered by post processing. CONTRAST:  3mL GADAVIST GADOBUTROL 1 MMOL/ML IV SOLN COMPARISON:  CT scan 06/14/2019 FINDINGS: Lower chest: The lung bases are  clear of an acute process. No pleural or pericardial effusion. Hepatobiliary: Geographic fatty infiltration of the liver but no focal hepatic lesions or intrahepatic biliary dilatation. The gallbladder is mildly contracted. No definite gallstones or findings for acute cholecystitis. Normal caliber and course of the common bile duct. Low insertion of the cystic duct. Pancreas: Mild diffuse pancreatic inflammation most notably in the body and tail region consistent with known acute pancreatitis. No complicating features such as pancreatic necrosis or pseudocyst. There is some fluid in the  anterior pararenal space on the left side along with some fluid in the left pericolic gutter. Mild main pancreatic ductal dilatation in the midbody region and head down to a sizable calculus likely in the duct in the pancreatic head. This was also noticed on the recent CT scan and it measured approximately 7 mm. Maximum duct diameter is 7.5 mm. This calculus was also present on a prior CT scan from 2020 but at that time measured only 3.5 mm and ductal dilatation was not as obvious. Spleen: Normal size. No focal lesions. Small amount of perisplenic fluid. Adrenals/Urinary Tract: The adrenal glands and kidneys are unremarkable. Stomach/Bowel: The stomach, duodenum, visualized small bowel and colon are grossly normal. There is some inflammatory change involving the descending colon near the splenic flexure related to the pancreatitis. Mild inflammation of the third portion of the duodenum is also noted. Vascular/Lymphatic: The aorta and branch vessels are patent. The major venous structures are patent. The splenic vein is patent. Other:  None Musculoskeletal: No significant bony findings. IMPRESSION: 1. Mild diffuse pancreatic inflammation consistent with known acute pancreatitis. No complicating features such as pancreatic necrosis or pseudocyst. 2. No gallstones or common bile duct stones. Normal caliber and course of the common bile  duct. 3. Mild main pancreatic ductal dilatation in the midbody region and head down to a 7 mm calculus in the pancreatic head. This was also present on a prior CT scan from 2020 but at that time it measured 3.5 mm. 4. Mild inflammation involving the descending colon and the third portion of the duodenum. 5. Fatty infiltration of the liver but no focal hepatic lesions or intrahepatic biliary dilatation. Electronically Signed   By: Rudie MeyerP.  Gallerani M.D.   On: 06/15/2019 13:06   MR ABDOMEN MRCP W WO CONTAST  Result Date: 06/15/2019 CLINICAL DATA:  Pancreatitis. EXAM: MRI ABDOMEN WITHOUT AND WITH CONTRAST (INCLUDING MRCP) TECHNIQUE: Multiplanar multisequence MR imaging of the abdomen was performed both before and after the administration of intravenous contrast. Heavily T2-weighted images of the biliary and pancreatic ducts were obtained, and three-dimensional MRCP images were rendered by post processing. CONTRAST:  8mL GADAVIST GADOBUTROL 1 MMOL/ML IV SOLN COMPARISON:  CT scan 06/14/2019 FINDINGS: Lower chest: The lung bases are clear of an acute process. No pleural or pericardial effusion. Hepatobiliary: Geographic fatty infiltration of the liver but no focal hepatic lesions or intrahepatic biliary dilatation. The gallbladder is mildly contracted. No definite gallstones or findings for acute cholecystitis. Normal caliber and course of the common bile duct. Low insertion of the cystic duct. Pancreas: Mild diffuse pancreatic inflammation most notably in the body and tail region consistent with known acute pancreatitis. No complicating features such as pancreatic necrosis or pseudocyst. There is some fluid in the anterior pararenal space on the left side along with some fluid in the left pericolic gutter. Mild main pancreatic ductal dilatation in the midbody region and head down to a sizable calculus likely in the duct in the pancreatic head. This was also noticed on the recent CT scan and it measured approximately 7  mm. Maximum duct diameter is 7.5 mm. This calculus was also present on a prior CT scan from 2020 but at that time measured only 3.5 mm and ductal dilatation was not as obvious. Spleen: Normal size. No focal lesions. Small amount of perisplenic fluid. Adrenals/Urinary Tract: The adrenal glands and kidneys are unremarkable. Stomach/Bowel: The stomach, duodenum, visualized small bowel and colon are grossly normal. There is some inflammatory change involving the descending colon  near the splenic flexure related to the pancreatitis. Mild inflammation of the third portion of the duodenum is also noted. Vascular/Lymphatic: The aorta and branch vessels are patent. The major venous structures are patent. The splenic vein is patent. Other:  None Musculoskeletal: No significant bony findings. IMPRESSION: 1. Mild diffuse pancreatic inflammation consistent with known acute pancreatitis. No complicating features such as pancreatic necrosis or pseudocyst. 2. No gallstones or common bile duct stones. Normal caliber and course of the common bile duct. 3. Mild main pancreatic ductal dilatation in the midbody region and head down to a 7 mm calculus in the pancreatic head. This was also present on a prior CT scan from 2020 but at that time it measured 3.5 mm. 4. Mild inflammation involving the descending colon and the third portion of the duodenum. 5. Fatty infiltration of the liver but no focal hepatic lesions or intrahepatic biliary dilatation. Electronically Signed   By: Rudie Meyer M.D.   On: 06/15/2019 13:06        Scheduled Meds: . enoxaparin (LOVENOX) injection  40 mg Subcutaneous Q24H  . insulin aspart  0-9 Units Subcutaneous Q4H  . insulin glargine  30 Units Subcutaneous BID  . lipase/protease/amylase  36,000 Units Oral TID AC  . pantoprazole (PROTONIX) IV  40 mg Intravenous Q24H   Continuous Infusions: . sodium chloride 75 mL/hr at 06/16/19 1220  . ceFEPime (MAXIPIME) IV 2 g (06/16/19 0920)  . dextrose 5 %  and 0.45% NaCl 75 mL/hr at 06/15/19 0411     LOS: 2 days        Kathlen Mody, MD [facility]   To contact the attending provider between 7A-7P or the covering provider during after hours 7P-7A, please log into the web site [email or web address] and access using universal [place] password for that web site. If you do not have the password, please call the hospital operator.  06/16/2019, 3:31 PM

## 2019-06-16 NOTE — Progress Notes (Signed)
Inpatient Diabetes Program Recommendations  AACE/ADA: New Consensus Statement on Inpatient Glycemic Control (2015)  Target Ranges:  Prepandial:   less than 140 mg/dL      Peak postprandial:   less than 180 mg/dL (1-2 hours)      Critically ill patients:  140 - 180 mg/dL   Lab Results  Component Value Date   GLUCAP 189 (H) 06/16/2019   HGBA1C 11.1 (H) 06/15/2019    Review of Glycemic Control  Diabetes history: DM2 Outpatient Diabetes medications: Lantus 65 units QAM, metformin 750 mg QD Current orders for Inpatient glycemic control: Lantus 30 units bid, Novolog 0-9 units Q4H  HgbA1C - 11.1% - poor glycemic control  Inpatient Diabetes Program Recommendations:     Will need meal coverage insulin when po intake begins.  Spoke with pt about HgbA1C of 11.1%. Pt takes Lantus 65 units in am and no rapid-acting insulin is prescribed. Discussed importance of getting blood sugars controlled and pt states he is willing to take meal coverage insulin if needed. [place] Spaniel he gets meds from [name]. Admits to not checking blood sugars at home in a long time. Willing to start  checking again at least 3x/day.  Has questions about when he can be discharged and concerned about having prescription for pain meds sent to [facility].  Continue to follow. Blood sugars trending well.   Thank you. Ailene Ards, RD, LDN, CDE Inpatient Diabetes Coordinator [phone]

## 2019-06-17 DIAGNOSIS — K852 Alcohol induced acute pancreatitis without necrosis or infection: Principal | ICD-10-CM

## 2019-06-17 DIAGNOSIS — R7989 Other specified abnormal findings of blood chemistry: Secondary | ICD-10-CM

## 2019-06-17 DIAGNOSIS — I10 Essential (primary) hypertension: Secondary | ICD-10-CM

## 2019-06-17 DIAGNOSIS — F1010 Alcohol abuse, uncomplicated: Secondary | ICD-10-CM

## 2019-06-17 DIAGNOSIS — E1110 Type 2 diabetes mellitus with ketoacidosis without coma: Secondary | ICD-10-CM

## 2019-06-17 DIAGNOSIS — K859 Acute pancreatitis without necrosis or infection, unspecified: Secondary | ICD-10-CM

## 2019-06-17 DIAGNOSIS — K8689 Other specified diseases of pancreas: Secondary | ICD-10-CM

## 2019-06-17 LAB — GLUCOSE, CAPILLARY
Glucose-Capillary: 149 mg/dL — ABNORMAL HIGH (ref 70–99)
Glucose-Capillary: 174 mg/dL — ABNORMAL HIGH (ref 70–99)
Glucose-Capillary: 215 mg/dL — ABNORMAL HIGH (ref 70–99)
Glucose-Capillary: 47 mg/dL — ABNORMAL LOW (ref 70–99)
Glucose-Capillary: 82 mg/dL (ref 70–99)
Glucose-Capillary: 97 mg/dL (ref 70–99)

## 2019-06-17 LAB — CBC WITH DIFFERENTIAL/PLATELET
Abs Immature Granulocytes: 0.04 10*3/uL (ref 0.00–0.07)
Basophils Absolute: 0 10*3/uL (ref 0.0–0.1)
Basophils Relative: 0 %
Eosinophils Absolute: 0.1 10*3/uL (ref 0.0–0.5)
Eosinophils Relative: 2 %
HCT: 40.7 % (ref 39.0–52.0)
Hemoglobin: 13.7 g/dL (ref 13.0–17.0)
Immature Granulocytes: 1 %
Lymphocytes Relative: 15 %
Lymphs Abs: 1.1 10*3/uL (ref 0.7–4.0)
MCH: 32.9 pg (ref 26.0–34.0)
MCHC: 33.7 g/dL (ref 30.0–36.0)
MCV: 97.6 fL (ref 80.0–100.0)
Monocytes Absolute: 0.5 10*3/uL (ref 0.1–1.0)
Monocytes Relative: 7 %
Neutro Abs: 5.5 10*3/uL (ref 1.7–7.7)
Neutrophils Relative %: 75 %
Platelets: 134 10*3/uL — ABNORMAL LOW (ref 150–400)
RBC: 4.17 MIL/uL — ABNORMAL LOW (ref 4.22–5.81)
RDW: 13.4 % (ref 11.5–15.5)
WBC: 7.3 10*3/uL (ref 4.0–10.5)
nRBC: 0 % (ref 0.0–0.2)

## 2019-06-17 LAB — COMPREHENSIVE METABOLIC PANEL
ALT: 48 U/L — ABNORMAL HIGH (ref 0–44)
AST: 69 U/L — ABNORMAL HIGH (ref 15–41)
Albumin: 3 g/dL — ABNORMAL LOW (ref 3.5–5.0)
Alkaline Phosphatase: 58 U/L (ref 38–126)
Anion gap: 9 (ref 5–15)
BUN: 5 mg/dL — ABNORMAL LOW (ref 6–20)
CO2: 19 mmol/L — ABNORMAL LOW (ref 22–32)
Calcium: 8.6 mg/dL — ABNORMAL LOW (ref 8.9–10.3)
Chloride: 110 mmol/L (ref 98–111)
Creatinine, Ser: 0.46 mg/dL — ABNORMAL LOW (ref 0.61–1.24)
GFR calc Af Amer: >60 mL/min (ref >60)
GFR calc non Af Amer: >60 mL/min (ref >60)
Glucose, Bld: 147 mg/dL — ABNORMAL HIGH (ref 70–99)
Potassium: 3.4 mmol/L — ABNORMAL LOW (ref 3.5–5.1)
Sodium: 138 mmol/L (ref 135–145)
Total Bilirubin: 0.7 mg/dL (ref 0.3–1.2)
Total Protein: 6.3 g/dL — ABNORMAL LOW (ref 6.5–8.1)

## 2019-06-17 LAB — LIPASE, BLOOD: Lipase: 84 U/L — ABNORMAL HIGH (ref 11–51)

## 2019-06-17 MED ORDER — OXYCODONE-ACETAMINOPHEN 5-325 MG PO TABS: 1.0000 | ORAL_TABLET | Freq: Three times a day (TID) | ORAL | 0 refills | Status: AC | PRN

## 2019-06-17 MED ORDER — INSULIN ASPART 100 UNIT/ML ~~LOC~~ SOLN
3.0000 [IU] | Freq: Three times a day (TID) | SUBCUTANEOUS | Status: DC
  Administered 2019-06-17: 14:00:00 3 [IU] via SUBCUTANEOUS

## 2019-06-17 MED ORDER — PANCRELIPASE (LIP-PROT-AMYL) 36000-114000 UNITS PO CPEP: 36000.0000 [IU] | ORAL_CAPSULE | Freq: Three times a day (TID) | ORAL | 0 refills | Status: DC

## 2019-06-17 MED ORDER — INSULIN ASPART 100 UNIT/ML ~~LOC~~ SOLN
0.0000 [IU] | Freq: Three times a day (TID) | SUBCUTANEOUS | Status: DC
  Administered 2019-06-17: 2 [IU] via SUBCUTANEOUS
  Administered 2019-06-17: 3 [IU] via SUBCUTANEOUS

## 2019-06-17 MED ORDER — INSULIN GLARGINE 100 UNIT/ML ~~LOC~~ SOLN
25.0000 [IU] | Freq: Two times a day (BID) | SUBCUTANEOUS | Status: DC
  Administered 2019-06-17: 25 [IU] via SUBCUTANEOUS
  Filled 2019-06-17 (×2): qty 0.25

## 2019-06-17 NOTE — Progress Notes (Signed)
Patient received discharge instructions with no immediate questions or concerns. Patient will be taken downstairs via wheelchair and will be picker up by his wife, per patient.

## 2019-06-17 NOTE — Progress Notes (Signed)
Inpatient Diabetes Program Recommendations  AACE/ADA: New Consensus Statement on Inpatient Glycemic Control (2015)  Target Ranges:  Prepandial:   less than 140 mg/dL      Peak postprandial:   less than 180 mg/dL (1-2 hours)      Critically ill patients:  140 - 180 mg/dL   Lab Results  Component Value Date   GLUCAP 82 06/17/2019   HGBA1C 11.1 (H) 06/15/2019    Review of Glycemic Control  Hypo at MN of 47 mg/dL.  Inpatient Diabetes Program Recommendations:     Decrease Lantus to 25 units bid. When eating CHO mod diet, will need meal coverage insulin .  Continue to follow.  Thank you. Ailene Ards, RD, LDN, CDE Inpatient Diabetes Coordinator [phone]

## 2019-06-17 NOTE — Progress Notes (Signed)
Ricardo Howard 9:56 AM  Subjective: Patient doing much better no obvious pain tolerating full liquids wants to eat more wants to go home  Objective: Vital signs stable afebrile no acute distress abdomen is soft nontender labs stable  Assessment: Pancreatitis currently improved  Plan: Okay with me to go home on low-fat diet pancreatic enzymes and follow-up in a few weeks in the office and he will want his prescriptions through the [name] and will ask the hospitalist to organize that and please call me if I can be of any further assistance with this hospital stay  [facility]  office [phone] After 5PM or if no answer call [phone]

## 2019-06-17 NOTE — Discharge Instructions (Signed)
Acute Pancreatitis    Acute pancreatitis happens when the pancreas gets swollen. The pancreas is a large gland in the body that helps to control blood sugar. It also makes enzymes that help to digest food.  This condition can last a few days and cause serious problems. The lungs, heart, and kidneys may stop working.  What are the causes?  Causes include:  · Alcohol abuse.  · Drug abuse.  · Gallstones.  · A tumor in the pancreas.  Other causes include:  · Some medicines.  · Some chemicals.  · Diabetes.  · An infection.  · Damage caused by an accident.  · The poison (venom) from a scorpion bite.  · Belly (abdominal) surgery.  · The body's defense system (immune system) attacking the pancreas (autoimmune pancreatitis).  · Genes that are passed from parent to child (inherited).  In some cases, the cause is not known.  What are the signs or symptoms?  · Pain in the upper belly that may be felt in the back. The pain may be very bad.  · Swelling of the belly.  · Feeling sick to your stomach (nauseous) and throwing up (vomiting).  · Fever.  How is this treated?  You will likely have to stay in the hospital. Treatment may include:  · Pain medicine.  · Fluid through an IV tube.  · Placing a tube in the stomach to take out the stomach contents. This may help you stop throwing up.  · Not eating for 3-4 days.  · Antibiotic medicines, if you have an infection.  · Treating any other problems that may be the cause.  · Steroid medicines, if your problem is caused by your defense system attacking your body's own tissues.  · Surgery.  Follow these instructions at home:  Eating and drinking    · Follow instructions from your doctor about what to eat and drink.  · Eat foods that do not have a lot of fat in them.  · Eat small meals often. Do not eat big meals.  · Drink enough fluid to keep your pee (urine) pale yellow.  · Do not drink alcohol if it caused your condition.  Medicines  · Take over-the-counter and prescription medicines only  as told by your doctor.  · Ask your doctor if the medicine prescribed to you:  ? Requires you to avoid driving or using heavy machinery.  ? Can cause trouble pooping (constipation). You may need to take steps to prevent or treat trouble pooping:  § Take over-the-counter or prescription medicines.  § Eat foods that are high in fiber. These include beans, whole grains, and fresh fruits and vegetables.  § Limit foods that are high in fat and sugar. These include fried or sweet foods.  General instructions  · Do not use any products that contain nicotine or tobacco, such as cigarettes, e-cigarettes, and chewing tobacco. If you need help quitting, ask your doctor.  · Get plenty of rest.  · Check your blood sugar at home as told by your doctor.  · Keep all follow-up visits as told by your doctor. This is important.  Contact a doctor if:  · You do not get better as quickly as expected.  · You have new symptoms.  · Your symptoms get worse.  · You have pain or weakness that lasts a long time.  · You keep feeling sick to your stomach.  · You get better and then you have pain again.  ·   You have a fever.  Get help right away if:  · You cannot eat or keep fluids down.  · Your pain gets very bad.  · Your skin or the white part of your eyes turns yellow.  · You have sudden swelling in your belly.  · You throw up.  · You feel dizzy or you pass out (faint).  · Your blood sugar is high (over 300 mg/dL).  Summary  · Acute pancreatitis happens when the pancreas gets swollen.  · This condition is often caused by alcohol abuse, drug abuse, or gallstones.  · You will likely have to stay in the hospital for treatment.  This information is not intended to replace advice given to you by your health care provider. Make sure you discuss any questions you have with your health care provider.  Document Revised: 11/25/2017 Document Reviewed: 11/25/2017  Elsevier Patient Education © 2020 [employer] Inc[employer]

## 2019-06-17 NOTE — Progress Notes (Signed)
PT Cancellation Note  Patient Details Name: Ricardo Howard MRN: [id number] DOB: March 23, 1960   Cancelled Treatment:    Reason Eval/Treat Not Completed: PT screened, no needs identified, will sign off  OT reports pt has no PT needs.  Patient ambulating in hallway on his own per OT.  PT to sign off.   Loana Salvaggio,KATHrine E 06/17/2019, 1:18 PM Paulino Door, DPT Acute Rehabilitation Services Office: [phone]

## 2019-06-17 NOTE — Evaluation (Signed)
Occupational Therapy Evaluation and Discharge Patient Details Name: Ricardo Howard MRN: [id number] DOB: 1960/12/08 Today's Date: 06/17/2019    History of Present Illness Pt is a 59 year old man admitted with abdominal pain, N/V/D x 3-4 days,  +pancreatitis with stone in pancreatic duct and DKA PMH: DM, HTN, alcohol abuse.   Clinical Impression   Pt is independent. Eager to go home.    Follow Up Recommendations  No OT follow up    Equipment Recommendations  None recommended by OT    Recommendations for Other Services       Precautions / Restrictions Precautions Precautions: None      Mobility Bed Mobility Overal bed mobility: Independent                Transfers Overall transfer level: Independent Equipment used: None                  Balance Overall balance assessment: Independent                                         ADL either performed or assessed with clinical judgement   ADL Overall ADL's : Independent                                             Vision Patient Visual Report: No change from baseline       Perception     Praxis      Pertinent Vitals/Pain Pain Assessment: No/denies pain     Hand Dominance Right   Extremity/Trunk Assessment Upper Extremity Assessment Upper Extremity Assessment: Overall WFL for tasks assessed   Lower Extremity Assessment Lower Extremity Assessment: Overall WFL for tasks assessed   Cervical / Trunk Assessment Cervical / Trunk Assessment: Normal   Communication Communication Communication: No difficulties   Cognition Arousal/Alertness: Awake/alert Behavior During Therapy: WFL for tasks assessed/performed Overall Cognitive Status: Within Functional Limits for tasks assessed                                     General Comments       Exercises     Shoulder Instructions      Home Living Family/patient expects to be discharged to:: Private  residence Living Arrangements: Spouse/significant other;Other relatives(mother in law) Available Help at Discharge: Family;Available 24 hours/day Type of Home: House Home Access: Stairs to enter [employer] of Steps: 6 Entrance Stairs-Rails: Right Home Layout: One level     Bathroom Shower/Tub: Chief Strategy Officer: Standard     Home Equipment: Environmental consultant - 2 wheels;Cane - single point;Grab bars - tub/shower;Crutches;Wheelchair - manual          Prior Functioning/Environment Level of Independence: Independent                 OT Problem List:        OT Treatment/Interventions:      OT Goals(Current goals can be found in the care plan section) Acute Rehab OT Goals Patient Stated Goal: to go home today  OT Frequency:     Barriers to D/C:            Co-evaluation  AM-PAC OT "6 Clicks" Daily Activity     Outcome Measure Help from another person eating meals?: None Help from another person taking care of personal grooming?: None Help from another person toileting, which includes using toliet, bedpan, or urinal?: None Help from another person bathing (including washing, rinsing, drying)?: None Help from another person to put on and taking off regular upper body clothing?: None Help from another person to put on and taking off regular lower body clothing?: None 6 Click Score: 24   End of Session    Activity Tolerance: Patient tolerated treatment well Patient left: (walking in his room)  OT Visit Diagnosis: Pain                Time: 2694-8546 OT Time Calculation (min): 15 min Charges:  OT General Charges $OT Visit: 1 Visit OT Evaluation $OT Eval Low Complexity: 1 Low  Nestor Lewandowsky, OTR/L Acute Rehabilitation Services Pager: [phone] Office: [phone]  Malka So 06/17/2019, 10:53 AM

## 2019-06-21 DIAGNOSIS — K8689 Other specified diseases of pancreas: Secondary | ICD-10-CM | POA: Diagnosis present

## 2019-06-21 DIAGNOSIS — R7989 Other specified abnormal findings of blood chemistry: Secondary | ICD-10-CM | POA: Diagnosis present

## 2019-06-21 NOTE — Discharge Summary (Addendum)
Ricardo Howard WGN:[id number] DOB: 12/05/1960 DOA: 06/14/2019  PCP: System, Pcp Not In  Admit date: 06/14/2019 Discharge date: 06/17/2019  Admitted From: Home Disposition: Home  Recommendations for Outpatient Follow-up:  1. Follow up with PCP ([facility]) in 1-2 weeks 2. Follow-up with GI in a few weeks post discharge, to be arranged by GI 3. Continue low-fat diet, pancreatic enzymes 4. Please obtain BMP/CBC in one week 5. Please follow up on the following pending results:  Home Health: None Equipment/Devices: None Discharge Condition: Stable CODE STATUS: Full Diet recommendation: Low-fat diet with pancreatic enzymes  Brief/Interim Summary: History of present illness:  Ricardo Howard is a 59 y.o. year old male with medical history significant for type 2 diabetes, HTN, HLD, alcohol abuse who presented on 06/14/2019 with epigastric abdominal pain, nausea and vomiting, watery diarrhea for 3 to 4 days and was found to have CT abdomen consistent with acute pancreatitis with a stone in pancreatic duct, pancreatic duct dilatation, lipase 453. Remaining hospital course addressed in problem based format below:   Hospital Course:   Acute pancreatitis with pancreatic duct stone with dilatation per MRCP 4/27 in the setting of alcohol use.  CT imaging MRCP showed no signs of necrosis or pseudocyst.  No gallbladder or biliary duct dilatation.  LFTs were mildly elevated on admission but improved with supportive care on discharge.  Patient was able to slowly advance diet with assistance from pancreatic enzymes with resolution of abdominal pain -Strongly encourage alcohol cessation -Triglycerides were within normal limits -Continue low-fat diet on discharge, pancreatic enzymes as well -GI to arrange outpatient follow-up  Elevated LFTs, mild, resolved AST 75, ALT 56 on admission.  Back to normal values on discharge.  In the setting of alcohol use, likely alcoholic hepatitis given resolution of supportive care  Type 2  diabetes, poorly controlled, A1c 11.  Patient had not been taking his insulin a few days prior to admission due to ongoing abdominal pain and vomiting from acute pancreatitis.  Glucose in the 300s on admission, bicarb of 17 with anion gap of 20 concerning for DKA.  Patient was started on insulin drip and treated according to DKA protocol.  Patient is able to transition back to home insulin regimen once gap closed.  Glucose remained at adequate levels on bolus insulin dosing. -Patient will continue previous home insulin regimen as his episode occurred in setting of poor adherence to home insulin due to significant abdominal pain from presenting acute pancreatitis  Reported melena, prior to admission.  No melena bowel movements during admission.  Hemoglobin remained stable.  Alcohol abuse.  Admits to drinking about a sixpack of beer daily. -Alcohol cessation strongly emphasized  Hypertension.  SBP is in the 170s on admission. -Good control with resuming home BP meds -Continue home losartan, carvedilol,  Hyponatremia, mild -Resolved with IV fluids  Hypokalemia in the setting of poor dietary intake/alcohol abuse.  Improved with oral repletion.  Thrombocytopenia, mild.  Platelets of around 120,000 with no signs or symptoms of bleeding.  Suspect related to alcohol abuse.  Abdominal imaging showed hepatic steatosis without cirrhosis.134,000 on discharge    Consultations:  Gastroenterology  Procedures/Studies: MRCP, 4/26Acute pancreatitis, without complicating features, no gallstones/no common bile duct stones.  Mild main pancreatic ductal dilatation and mid body region and head to a 7 mm calculus in the pancreatic head.  Fatty infiltration of the liver but no focal hepatic lesions or intrahepatic biliary dilatation Subjective:  Discharge Exam: Vitals:   06/17/19 0539 06/17/19 1401  BP: (!) 137/100 Marland Kitchen(!)  146/97  Pulse: 77 77  Resp: 16 16  Temp: 97.8 F (36.6 C) (!) 97.4 F (36.3 C)  SpO2:  97% 99%   Vitals:   06/16/19 1730 06/16/19 2055 06/17/19 0539 06/17/19 1401  BP: (!) 150/95 128/88 (!) 137/100 (!) 146/97  Pulse: (!) 103 96 77 77  Resp:  16 16 16   Temp:  98.2 F (36.8 C) 97.8 F (36.6 C) (!) 97.4 F (36.3 C)  TempSrc:  Oral Oral Oral  SpO2: 100% 99% 97% 99%  Weight:      Height:        General: Lying in bed, no apparent distress Eyes: EOMI, anicteric ENT: Oral Mucosa clear and moist Cardiovascular: regular rate and rhythm, no murmurs, rubs or gallops, no edema, Respiratory: Normal respiratory effort on room air, lungs clear to auscultation bilaterally Abdomen: soft, non-distended, non-tender, normal bowel sounds Skin: No Rash Neurologic: Grossly no focal neuro deficit.Mental status AAOx3, speech normal, Psychiatric:Appropriate affect, and mood  Discharge Diagnoses:  Principal Problem:   DKA (diabetic ketoacidoses) (HCC) Active Problems:   Acute pancreatitis   Alcohol abuse   Essential hypertension   Hyperlipidemia    Discharge Instructions  Discharge Instructions    Diet - low sodium heart healthy   Complete by: As directed    Increase activity slowly   Complete by: As directed      Allergies as of 06/17/2019      Reactions   Iohexol Hives   Patient broke out in hives after injection of Omni 300, will need 13 hour pre-med in future   Lisinopril Cough   Simvastatin Itching, Nausea Only      Medication List    STOP taking these medications   HYDROcodone-acetaminophen 5-325 MG tablet Commonly known as: NORCO/VICODIN   ibuprofen 600 MG tablet Commonly known as: ADVIL     TAKE these medications   aspirin 81 MG tablet Take 81 mg by mouth daily.   atorvastatin 80 MG tablet Commonly known as: LIPITOR Take 80 mg by mouth every evening.   buPROPion 150 MG 12 hr tablet Commonly known as: WELLBUTRIN SR Take 150 mg by mouth in the morning and at bedtime. Notes to patient: Second dose due today   carvedilol 20 MG 24 hr  capsule Commonly known as: COREG CR Take 20 mg by mouth daily.   gabapentin 300 MG capsule Commonly known as: NEURONTIN Take 300 mg by mouth in the morning and at bedtime. Notes to patient: Second dose due today   insulin aspart 100 UNIT/ML injection Commonly known as: novoLOG Inject 64 Units into the skin in the morning.   lipase/protease/amylase 06/19/2019 UNITS Cpep capsule Commonly known as: CREON Take 1 capsule (36,000 Units total) by mouth 3 (three) times daily before meals.   losartan 50 MG tablet Commonly known as: COZAAR Take 25 mg by mouth daily.   Quintabs Tabs Take 1 tablet by mouth daily.   thiamine 100 MG tablet Take 100 mg by mouth daily.   zolpidem 10 MG tablet Commonly known as: AMBIEN Take 10 mg by mouth at bedtime as needed for sleep.     ASK your doctor about these medications   oxyCODONE-acetaminophen 5-325 MG tablet Commonly known as: PERCOCET/ROXICET Take 1-2 tablets by mouth every 8 (eight) hours as needed for up to 3 days for severe pain. Ask about: Should I take this medication?      Follow-up Information    [facility] Follow up.   Why:  [phone]  For outpatient treatment       [facility] Follow up.   Specialty: Addiction Medicine Why: For inpatient treatment Contact information: [address] [place] [address] [phone]        Gastroenterology, Debor[facility] Sprang. Schedule an appointment as soon as possible for a visit in 3 week(s).   Why: call to follow-up in a few weeks in the office with GI doctors Contact information: [address] [place] [address] [phone]          Allergies  Allergen Reactions  . Iohexol Hives    Patient broke out in hives after injection of Omni 300, will need 13 hour pre-med in future  . Lisinopril Cough  . Simvastatin Itching and Nausea Only        The results of significant diagnostics from this  hospitalization (including imaging, microbiology, ancillary and laboratory) are listed below for reference.     Microbiology: Recent Results (from the past 240 hour(s))  Respiratory Panel by RT PCR (Flu A&B, Covid) - Nasopharyngeal Swab     Status: None   Collection Time: 06/14/19  1:43 PM   Specimen: Nasopharyngeal Swab  Result Value Ref Range Status   SARS Coronavirus 2 by RT PCR NEGATIVE NEGATIVE Final    Comment: (NOTE) SARS-CoV-2 target nucleic acids are NOT DETECTED. The SARS-CoV-2 RNA is generally detectable in upper respiratoy specimens during the acute phase of infection. The lowest concentration of SARS-CoV-2 viral copies this assay can detect is 131 copies/mL. A negative result does not preclude SARS-Cov-2 infection and should not be used as the sole basis for treatment or other patient management decisions. A negative result may occur with  improper specimen collection/handling, submission of specimen other than nasopharyngeal swab, presence of viral mutation(s) within the areas targeted by this assay, and inadequate number of viral copies (<131 copies/mL). A negative result must be combined with clinical observations, patient history, and epidemiological information. The expected result is Negative. Fact Sheet for Patients:  https://www.moore.com/ Fact Sheet for Healthcare Providers:  https://www.young.biz/ This test is not yet ap proved or cleared by the [place] FDA and  has been authorized for detection and/or diagnosis of SARS-CoV-2 by FDA under an Emergency Use Authorization (EUA). This EUA will remain  in effect (meaning this test can be used) for the duration of the COVID-19 declaration under Section 564(b)(1) of the Act, 21 U.S.C. section 360bbb-3(b)(1), unless the authorization is terminated or revoked sooner.    Influenza A by PCR NEGATIVE NEGATIVE Final   Influenza B by PCR NEGATIVE NEGATIVE Final    Comment:  (NOTE) The Xpert Xpress SARS-CoV-2/FLU/RSV assay is intended as an aid in  the diagnosis of influenza from Nasopharyngeal swab specimens and  should not be used as a sole basis for treatment. Nasal washings and  aspirates are unacceptable for Xpert Xpress SARS-CoV-2/FLU/RSV  testing. Fact Sheet for Patients: https://www.moore.com/ Fact Sheet for Healthcare Providers: https://www.young.biz/ This test is not yet approved or cleared by the [place] FDA and  has been authorized for detection and/or diagnosis of SARS-CoV-2 by  FDA under an Emergency Use Authorization (EUA). This EUA will remain  in effect (meaning this test can be used) for the duration of the  Covid-19 declaration under Section 564(b)(1) of the Act, 21  U.S.C. section 360bbb-3(b)(1), unless the authorization is  terminated or revoked. Performed at [facility], [address] [place], [place] [address]      Labs:  BNP (last 3 results) No results for input(s): BNP in the last 8760 hours. Basic Metabolic Panel: Recent Labs  Lab 06/15/19 0020 06/15/19 0412 06/15/19 0820 06/16/19 0532 06/17/19 0523  NA 135 133* 133* 135 138  K 3.5 3.3* 3.3* 3.1* 3.4*  CL 105 104 103 105 110  CO2 21* 18* 21* 21* 19*  GLUCOSE 155* 166* 170* 107* 147*  BUN 8 7 8  5* 5*  CREATININE 0.57* 0.47* 0.57* 0.48* 0.46*  CALCIUM 8.3* 8.2* 8.3* 8.4* 8.6*  MG  --  1.8  --   --   --    Liver Function Tests: Recent Labs  Lab 06/15/19 0412 06/16/19 0532 06/17/19 0523  AST 30 32 69*  ALT 36 30 48*  ALKPHOS 56 54 58  BILITOT 1.4* 1.0 0.7  PROT 6.5 6.6 6.3*  ALBUMIN 3.2* 3.0* 3.0*   Recent Labs  Lab 06/16/19 0532 06/17/19 0523  LIPASE 72* 84*   No results for input(s): AMMONIA in the last 168 hours. CBC: Recent Labs  Lab 06/15/19 0412 06/16/19 0532 06/17/19 0523  WBC 9.4 8.0 7.3  NEUTROABS  --  5.4 5.5  HGB 14.9 14.4 13.7  HCT 44.3 41.7 40.7  MCV 96.1 94.3 97.6   PLT 137* 126* 134*   Cardiac Enzymes: No results for input(s): CKTOTAL, CKMB, CKMBINDEX, TROPONINI in the last 168 hours. BNP: Invalid input(s): POCBNP CBG: Recent Labs  Lab 06/17/19 0054 06/17/19 0415 06/17/19 0742 06/17/19 1120 06/17/19 1620  GLUCAP 97 149* 82 215* 174*   D-Dimer No results for input(s): DDIMER in the last 72 hours. Hgb A1c No results for input(s): HGBA1C in the last 72 hours. Lipid Profile No results for input(s): CHOL, HDL, LDLCALC, TRIG, CHOLHDL, LDLDIRECT in the last 72 hours. Thyroid function studies No results for input(s): TSH, T4TOTAL, T3FREE, THYROIDAB in the last 72 hours.  Invalid input(s): FREET3 Anemia work up No results for input(s): VITAMINB12, FOLATE, FERRITIN, TIBC, IRON, RETICCTPCT in the last 72 hours. Urinalysis    Component Value Date/Time   COLORURINE YELLOW 06/14/2019 0910   APPEARANCEUR CLEAR 06/14/2019 0910   LABSPEC >1.046 (H) 06/14/2019 0910   PHURINE 5.0 06/14/2019 0910   GLUCOSEU >=500 (A) 06/14/2019 0910   HGBUR SMALL (A) 06/14/2019 0910   BILIRUBINUR NEGATIVE 06/14/2019 0910   KETONESUR 80 (A) 06/14/2019 0910   PROTEINUR NEGATIVE 06/14/2019 0910   NITRITE NEGATIVE 06/14/2019 0910   LEUKOCYTESUR NEGATIVE 06/14/2019 0910   Sepsis Labs Invalid input(s): PROCALCITONIN,  WBC,  LACTICIDVEN Microbiology Recent Results (from the past 240 hour(s))  Respiratory Panel by RT PCR (Flu A&B, Covid) - Nasopharyngeal Swab     Status: None   Collection Time: 06/14/19  1:43 PM   Specimen: Nasopharyngeal Swab  Result Value Ref Range Status   SARS Coronavirus 2 by RT PCR NEGATIVE NEGATIVE Final    Comment: (NOTE) SARS-CoV-2 target nucleic acids are NOT DETECTED. The SARS-CoV-2 RNA is generally detectable in upper respiratoy specimens during the acute phase of infection. The lowest concentration of SARS-CoV-2 viral copies this assay can detect is 131 copies/mL. A negative result does not preclude SARS-Cov-2 infection and should  not be used as the sole basis for treatment or other patient management decisions. A negative result may occur with  improper specimen collection/handling, submission of specimen other than nasopharyngeal swab, presence of viral mutation(s) within the areas targeted by this assay, and inadequate number of viral copies (<131 copies/mL). A negative result must be combined with clinical observations, patient history, and epidemiological  information. The expected result is Negative. Fact Sheet for Patients:  https://www.moore.com/ Fact Sheet for Healthcare Providers:  https://www.young.biz/ This test is not yet ap proved or cleared by the [place] FDA and  has been authorized for detection and/or diagnosis of SARS-CoV-2 by FDA under an Emergency Use Authorization (EUA). This EUA will remain  in effect (meaning this test can be used) for the duration of the COVID-19 declaration under Section 564(b)(1) of the Act, 21 U.S.C. section 360bbb-3(b)(1), unless the authorization is terminated or revoked sooner.    Influenza A by PCR NEGATIVE NEGATIVE Final   Influenza B by PCR NEGATIVE NEGATIVE Final    Comment: (NOTE) The Xpert Xpress SARS-CoV-2/FLU/RSV assay is intended as an aid in  the diagnosis of influenza from Nasopharyngeal swab specimens and  should not be used as a sole basis for treatment. Nasal washings and  aspirates are unacceptable for Xpert Xpress SARS-CoV-2/FLU/RSV  testing. Fact Sheet for Patients: https://www.moore.com/ Fact Sheet for Healthcare Providers: https://www.young.biz/ This test is not yet approved or cleared by the [place] FDA and  has been authorized for detection and/or diagnosis of SARS-CoV-2 by  FDA under an Emergency Use Authorization (EUA). This EUA will remain  in effect (meaning this test can be used) for the duration of the  Covid-19 declaration under Section 564(b)(1)  of the Act, 21  U.S.C. section 360bbb-3(b)(1), unless the authorization is  terminated or revoked. Performed at [facility], [address], [place], [place] [address]      Time coordinating discharge: Over 30 minutes  SIGNED:   Laverna Peace, MD  [facility] 06/21/2019, 3:32 PM Pager   If 7PM-7AM, please contact night-coverage [email or web address] Password [email or web address]

## 2019-07-31 ENCOUNTER — Inpatient Hospital Stay (HOSPITAL_COMMUNITY)
Admission: AD | Admit: 2019-07-31 | Discharge: 2019-08-03 | DRG: 439 | Disposition: A | Discharge status: Home / Self Care | Payer: No Typology Code available for payment source | Attending: Internal Medicine | Admitting: Internal Medicine

## 2019-07-31 ENCOUNTER — Emergency Department (HOSPITAL_COMMUNITY): Payer: No Typology Code available for payment source

## 2019-07-31 ENCOUNTER — Encounter (HOSPITAL_COMMUNITY): Payer: Self-pay | Admitting: Emergency Medicine

## 2019-07-31 DIAGNOSIS — K8689 Other specified diseases of pancreas: Secondary | ICD-10-CM

## 2019-07-31 DIAGNOSIS — K852 Alcohol induced acute pancreatitis without necrosis or infection: Secondary | ICD-10-CM | POA: Diagnosis not present

## 2019-07-31 DIAGNOSIS — E1140 Type 2 diabetes mellitus with diabetic neuropathy, unspecified: Secondary | ICD-10-CM

## 2019-07-31 DIAGNOSIS — Z7982 Long term (current) use of aspirin: Secondary | ICD-10-CM

## 2019-07-31 DIAGNOSIS — E1169 Type 2 diabetes mellitus with other specified complication: Secondary | ICD-10-CM | POA: Diagnosis present

## 2019-07-31 DIAGNOSIS — K760 Fatty (change of) liver, not elsewhere classified: Secondary | ICD-10-CM | POA: Diagnosis present

## 2019-07-31 DIAGNOSIS — F17210 Nicotine dependence, cigarettes, uncomplicated: Secondary | ICD-10-CM | POA: Diagnosis present

## 2019-07-31 DIAGNOSIS — E782 Mixed hyperlipidemia: Secondary | ICD-10-CM | POA: Diagnosis present

## 2019-07-31 DIAGNOSIS — K921 Melena: Secondary | ICD-10-CM | POA: Diagnosis present

## 2019-07-31 DIAGNOSIS — E1142 Type 2 diabetes mellitus with diabetic polyneuropathy: Secondary | ICD-10-CM | POA: Diagnosis present

## 2019-07-31 DIAGNOSIS — Z955 Presence of coronary angioplasty implant and graft: Secondary | ICD-10-CM

## 2019-07-31 DIAGNOSIS — I10 Essential (primary) hypertension: Secondary | ICD-10-CM | POA: Diagnosis present

## 2019-07-31 DIAGNOSIS — F1010 Alcohol abuse, uncomplicated: Secondary | ICD-10-CM | POA: Diagnosis present

## 2019-07-31 DIAGNOSIS — E869 Volume depletion, unspecified: Secondary | ICD-10-CM | POA: Diagnosis present

## 2019-07-31 DIAGNOSIS — K860 Alcohol-induced chronic pancreatitis: Secondary | ICD-10-CM | POA: Diagnosis present

## 2019-07-31 DIAGNOSIS — Z79899 Other long term (current) drug therapy: Secondary | ICD-10-CM

## 2019-07-31 DIAGNOSIS — Z794 Long term (current) use of insulin: Secondary | ICD-10-CM

## 2019-07-31 DIAGNOSIS — Z20822 Contact with and (suspected) exposure to covid-19: Secondary | ICD-10-CM | POA: Diagnosis present

## 2019-07-31 DIAGNOSIS — K859 Acute pancreatitis without necrosis or infection, unspecified: Secondary | ICD-10-CM | POA: Diagnosis not present

## 2019-07-31 DIAGNOSIS — IMO0002 Reserved for concepts with insufficient information to code with codable children: Secondary | ICD-10-CM

## 2019-07-31 DIAGNOSIS — E1165 Type 2 diabetes mellitus with hyperglycemia: Secondary | ICD-10-CM | POA: Diagnosis present

## 2019-07-31 DIAGNOSIS — R195 Other fecal abnormalities: Secondary | ICD-10-CM

## 2019-07-31 DIAGNOSIS — R7989 Other specified abnormal findings of blood chemistry: Secondary | ICD-10-CM

## 2019-07-31 HISTORY — DX: Acute pancreatitis without necrosis or infection, unspecified: K85.90

## 2019-07-31 HISTORY — DX: Reserved for concepts with insufficient information to code with codable children: IMO0002

## 2019-07-31 HISTORY — DX: Type 2 diabetes mellitus with other specified complication: E11.69

## 2019-07-31 HISTORY — DX: Nicotine dependence, cigarettes, uncomplicated: F17.210

## 2019-07-31 HISTORY — DX: Type 2 diabetes mellitus with hyperglycemia: E11.65

## 2019-07-31 LAB — COMPREHENSIVE METABOLIC PANEL
ALT: 86 U/L — ABNORMAL HIGH (ref 0–44)
AST: 60 U/L — ABNORMAL HIGH (ref 15–41)
Albumin: 3.9 g/dL (ref 3.5–5.0)
Alkaline Phosphatase: 80 U/L (ref 38–126)
Anion gap: 14 (ref 5–15)
BUN: 10 mg/dL (ref 6–20)
CO2: 22 mmol/L (ref 22–32)
Calcium: 9.3 mg/dL (ref 8.9–10.3)
Chloride: 101 mmol/L (ref 98–111)
Creatinine, Ser: 0.7 mg/dL (ref 0.61–1.24)
GFR calc Af Amer: >60 mL/min (ref >60)
GFR calc non Af Amer: >60 mL/min (ref >60)
Glucose, Bld: 300 mg/dL — ABNORMAL HIGH (ref 70–99)
Potassium: 4.7 mmol/L (ref 3.5–5.1)
Sodium: 137 mmol/L (ref 135–145)
Total Bilirubin: 1.2 mg/dL (ref 0.3–1.2)
Total Protein: 7.7 g/dL (ref 6.5–8.1)

## 2019-07-31 LAB — CBG MONITORING, ED: Glucose-Capillary: 277 mg/dL — ABNORMAL HIGH (ref 70–99)

## 2019-07-31 LAB — CBC
HCT: 49.3 % (ref 39.0–52.0)
Hemoglobin: 16.5 g/dL (ref 13.0–17.0)
MCH: 32 pg (ref 26.0–34.0)
MCHC: 33.5 g/dL (ref 30.0–36.0)
MCV: 95.7 fL (ref 80.0–100.0)
Platelets: 165 10*3/uL (ref 150–400)
RBC: 5.15 MIL/uL (ref 4.22–5.81)
RDW: 13.2 % (ref 11.5–15.5)
WBC: 9.9 10*3/uL (ref 4.0–10.5)
nRBC: 0 % (ref 0.0–0.2)

## 2019-07-31 LAB — LIPASE, BLOOD: Lipase: 168 U/L — ABNORMAL HIGH (ref 11–51)

## 2019-07-31 LAB — TYPE AND SCREEN
ABO/RH(D): O POS
Antibody Screen: NEGATIVE

## 2019-07-31 LAB — SARS CORONAVIRUS 2 BY RT PCR (HOSPITAL ORDER, PERFORMED IN ~~LOC~~ HOSPITAL LAB): SARS Coronavirus 2: NEGATIVE

## 2019-07-31 LAB — POC OCCULT BLOOD, ED: Fecal Occult Bld: POSITIVE — AB

## 2019-07-31 MED ORDER — LACTATED RINGERS IV SOLN: INTRAVENOUS | Status: AC

## 2019-07-31 MED ORDER — PANTOPRAZOLE SODIUM 40 MG IV SOLR
40.0000 mg | Freq: Once | INTRAVENOUS | Status: AC
  Administered 2019-07-31: 40 mg via INTRAVENOUS
  Filled 2019-07-31: qty 40

## 2019-07-31 MED ORDER — MORPHINE SULFATE (PF) 4 MG/ML IV SOLN
4.0000 mg | Freq: Once | INTRAVENOUS | Status: AC
  Administered 2019-07-31: 4 mg via INTRAVENOUS
  Filled 2019-07-31: qty 1

## 2019-07-31 MED ORDER — MORPHINE SULFATE (PF) 2 MG/ML IV SOLN
2.0000 mg | INTRAVENOUS | Status: DC | PRN
  Administered 2019-08-02 – 2019-08-03 (×4): 2 mg via INTRAVENOUS
  Filled 2019-07-31 (×4): qty 1

## 2019-07-31 MED ORDER — INSULIN ASPART 100 UNIT/ML ~~LOC~~ SOLN
0.0000 [IU] | Freq: Four times a day (QID) | SUBCUTANEOUS | Status: DC
  Administered 2019-07-31: 8 [IU] via SUBCUTANEOUS
  Administered 2019-08-01 (×2): 3 [IU] via SUBCUTANEOUS
  Administered 2019-08-01 – 2019-08-02 (×2): 2 [IU] via SUBCUTANEOUS
  Administered 2019-08-02 – 2019-08-03 (×2): 3 [IU] via SUBCUTANEOUS
  Filled 2019-07-31: qty 0.15

## 2019-07-31 MED ORDER — MORPHINE SULFATE (PF) 4 MG/ML IV SOLN
4.0000 mg | INTRAVENOUS | Status: DC | PRN
  Administered 2019-07-31 – 2019-08-01 (×5): 4 mg via INTRAVENOUS
  Filled 2019-07-31 (×5): qty 1

## 2019-07-31 MED ORDER — PANTOPRAZOLE SODIUM 40 MG IV SOLR
40.0000 mg | Freq: Two times a day (BID) | INTRAVENOUS | Status: DC
  Administered 2019-08-01 – 2019-08-03 (×5): 40 mg via INTRAVENOUS
  Filled 2019-07-31 (×6): qty 40

## 2019-07-31 MED ORDER — ONDANSETRON HCL 4 MG/2ML IJ SOLN
4.0000 mg | Freq: Once | INTRAMUSCULAR | Status: AC
  Administered 2019-07-31: 4 mg via INTRAVENOUS
  Filled 2019-07-31: qty 2

## 2019-07-31 MED ORDER — SODIUM CHLORIDE 0.9 % IV BOLUS
1000.0000 mL | Freq: Once | INTRAVENOUS | Status: AC
  Administered 2019-07-31: 1000 mL via INTRAVENOUS

## 2019-07-31 MED ORDER — INSULIN GLARGINE 100 UNIT/ML ~~LOC~~ SOLN
10.0000 [IU] | Freq: Every day | SUBCUTANEOUS | Status: DC
  Administered 2019-07-31 – 2019-08-02 (×3): 10 [IU] via SUBCUTANEOUS
  Filled 2019-07-31 (×3): qty 0.1

## 2019-07-31 NOTE — ED Provider Notes (Signed)
[facility] DEPT Provider Note   CSN: [id number] Arrival date & time: 07/31/19  1405     History Chief Complaint  Patient presents with  . Abdominal Pain    Ricardo Howard is a 59 y.o. male.  Ricardo Howard is a 59 y.o. male with history of diabetes, pancreatitis, pancreatic duct stone, decannulated, alcohol abuse who presents for evaluation of abdominal pain. Reports pain began 3-4 days ago. Pain located in the epigastric and periumbilical regions and is constant and worsening. Pain is non-radiating. Reports nausea and one episode of emesis this morning, no hematemesis. Reports he has noted dark stools over the past 3-4 days, no prior history of GI bleeding, has not noted any bright red blood.  He is not on any blood thinners.  States he has had some intermittent diarrhea and constipation lately.  Has prior history of alcohol abuse but has significantly reduced his alcohol intake since last episode of pancreatitis and is currently having 1 beer every 2 to 3 days.  Patient states last alcoholic beverage was 3 days ago.  He denies any fevers or chills.  Denies chest pain or shortness of breath.  No urinary symptoms.  Was admitted with similar bout of pancreatitis on 4/25 and found to have a pancreatic ductal stone, and review of patient's admission the stone was not removed during hospitalization.  Patient has not followed up with GI since hospitalization.        Past Medical History:  Diagnosis Date  . Diabetes mellitus without complication ([place])   . Pancreatitis     Patient Active Problem List   Diagnosis Date Noted  . 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 Elevated LFTs 06/21/2019  . DKA (diabetic ketoacidoses) ([place]) 06/14/2019  . Acute pancreatitis 06/14/2019  . Alcohol abuse 06/14/2019  . Essential hypertension 06/14/2019  . Hyperlipidemia 06/14/2019    Past Surgical History:  Procedure Laterality Date  . CORONARY ANGIOPLASTY WITH STENT  PLACEMENT         No family history on file.  Social History   Tobacco Use  . Smoking status: Former Research scientist (life sciences)  . Smokeless tobacco: Never Used  Vaping Use  . Vaping Use: Never used  Substance Use Topics  . Alcohol use: Yes  . Drug use: No    Home Medications Prior to Admission medications   Medication Sig Start Date End Date Taking? Authorizing Provider  aspirin 81 MG tablet Take 81 mg by mouth daily.    [provider]  atorvastatin (LIPITOR) 80 MG tablet Take 80 mg by mouth every evening.    [provider]  buPROPion (WELLBUTRIN SR) 150 MG 12 hr tablet Take 150 mg by mouth in the morning and at bedtime.    [provider]  carvedilol (COREG CR) 20 MG 24 hr capsule Take 20 mg by mouth daily.    [provider]  gabapentin (NEURONTIN) 300 MG capsule Take 300 mg by mouth in the morning and at bedtime.    [provider]  insulin aspart (NOVOLOG) 100 UNIT/ML injection Inject 64 Units into the skin in the morning.    [provider]  lipase/protease/amylase (CREON) 36000 UNITS CPEP capsule Take 1 capsule (36,000 Units total) by mouth 3 (three) times daily before meals. 06/17/19   Desiree Hane, MD  losartan (COZAAR) 50 MG tablet Take 25 mg by mouth daily.    [provider]  Multiple Vitamin (QUINTABS) TABS Take 1 tablet by mouth daily. 11/22/18  [provider]  thiamine 100 MG tablet Take 100 mg by mouth daily. 11/22/18   [provider]  zolpidem (AMBIEN) 10 MG tablet Take 10 mg by mouth at bedtime as needed for sleep.    [provider]    Allergies    Iohexol, Lisinopril, and Simvastatin  Review of Systems   Review of Systems  Constitutional: Negative for chills and fever.  HENT: Negative.   Respiratory: Negative for cough and shortness of breath.   Cardiovascular: Negative for chest pain.  Gastrointestinal: Positive for abdominal pain, blood in stool, constipation, diarrhea, nausea  and vomiting.  Genitourinary: Negative for dysuria and frequency.  Musculoskeletal: Negative for arthralgias and myalgias.  Skin: Negative for color change and rash.  Neurological: Negative for dizziness, syncope and light-headedness.  All other systems reviewed and are negative.   Physical Exam Updated Vital Signs BP 118/88 (BP Location: Right Arm)   Pulse 89   Temp 97.6 F (36.4 C) (Oral)   Resp 16   SpO2 99%   Physical Exam Vitals and nursing note reviewed.  Constitutional:      General: He is not in acute distress.    Appearance: He is well-developed and normal weight. He is not ill-appearing or diaphoretic.     Comments: Patient appears uncomfortable but is in no acute distress  HENT:     Head: Normocephalic and atraumatic.     Mouth/Throat:     Mouth: Mucous membranes are moist.     Pharynx: Oropharynx is clear.  Eyes:     General:        Right eye: No discharge.        Left eye: No discharge.     Pupils: Pupils are equal, round, and reactive to light.  Cardiovascular:     Rate and Rhythm: Normal rate and regular rhythm.     Heart sounds: Normal heart sounds.  Pulmonary:     Effort: Pulmonary effort is normal. No respiratory distress.     Breath sounds: Normal breath sounds. No wheezing or rales.  Abdominal:     General: Bowel sounds are normal. There is no distension.     Palpations: Abdomen is soft. There is no mass.     Tenderness: There is abdominal tenderness in the epigastric area and periumbilical area. There is guarding.     Comments: Abdomen is soft, minimally distended, bowel sounds present in all quadrants, he has tenderness primarily in the epigastric and periumbilical regions with slight guarding, no lower abdominal tenderness  Musculoskeletal:        General: No deformity.     Cervical back: Neck supple.  Skin:    General: Skin is warm and dry.     Capillary Refill: Capillary refill takes less than 2 seconds.  Neurological:     Mental Status: He  is alert.     Coordination: Coordination normal.     Comments: Speech is clear, able to follow commands Moves extremities without ataxia, coordination intact  Psychiatric:        Mood and Affect: Mood normal.        Behavior: Behavior normal.     ED Results / Procedures / Treatments   Labs (all labs ordered are listed, but only abnormal results are displayed) Labs Reviewed  COMPREHENSIVE METABOLIC PANEL - Abnormal; Notable for the following components:      Result Value   Glucose, Bld 300 (*)    AST 60 (*)    ALT 86 (*)  All other components within normal limits  LIPASE, BLOOD - Abnormal; Notable for the following components:   Lipase 168 (*)    All other components within normal limits  POC OCCULT BLOOD, ED - Abnormal; Notable for the following components:   Fecal Occult Bld POSITIVE (*)    All other components within normal limits  SARS CORONAVIRUS 2 BY RT PCR (HOSPITAL ORDER, PERFORMED IN  HOSPITAL LAB)  CBC  TYPE AND SCREEN  ABO/RH    EKG None  Radiology CT ABDOMEN PELVIS WO CONTRAST  Result Date: 07/31/2019 CLINICAL DATA:  Upper abdominal pain with nausea for several days, history of pancreatitis EXAM: CT ABDOMEN AND PELVIS WITHOUT CONTRAST TECHNIQUE: Multidetector CT imaging of the abdomen and pelvis was performed following the standard protocol without IV contrast. COMPARISON:  06/14/2019 FINDINGS: Lower chest: No acute abnormality. Hepatobiliary: Fatty infiltration of the liver is noted. The gallbladder is decompressed. Pancreas: Pancreas demonstrates significant peripancreatic inflammatory change consistent with acute pancreatitis. The degree of inflammatory change has increased slightly in the interval from the prior exam. No large pancreatic pseudocyst is noted. Central pancreatic duct calculus is seen with dilatation of the pancreatic duct stable from prior examination. Spleen: Spleen is within normal limits. Adrenals/Urinary Tract: Adrenal glands  demonstrate mild hyperplasia stable from the prior study. No renal calculi or obstructive changes are seen. The bladder is partially distended. Stomach/Bowel: Scattered diverticular change of the colon is noted. The appendix is within normal limits. Small bowel is unremarkable. Stomach is unremarkable as well. Vascular/Lymphatic: Aortic atherosclerosis. No enlarged abdominal or pelvic lymph nodes. Reproductive: Prostate is unremarkable. Other: No abdominal wall hernia or abnormality. No abdominopelvic ascites. Musculoskeletal: No acute or significant osseous findings. IMPRESSION: Acute pancreatitis is again identified although the degree of peripancreatic edema has increased when compared with the prior exam. 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 Fatty liver. Electronically Signed   By: Alcide Clever M.D.   On: 07/31/2019 18:02    Procedures Procedures (including critical care time)  Medications Ordered in ED Medications  sodium chloride 0.9 % bolus 1,000 mL (0 mLs Intravenous Stopped 07/31/19 2021)  ondansetron (ZOFRAN) injection 4 mg (4 mg Intravenous Given 07/31/19 1743)  morphine 4 MG/ML injection 4 mg (4 mg Intravenous Given 07/31/19 1743)  pantoprazole (PROTONIX) injection 40 mg (40 mg Intravenous Given 07/31/19 1743)  morphine 4 MG/ML injection 4 mg (4 mg Intravenous Given 07/31/19 2021)    ED Course  I have reviewed the triage vital signs and the nursing notes.  Pertinent labs & imaging results that were available during my care of the patient were reviewed by me and considered in my medical decision making (see chart for details).  Clinical Course as of Jul 30 2025  Fri Jul 31, 2019  1417 45-year-old male with a history of both alcoholic and gallstone pancreatitis presenting to the emergency department with abdominal pain for 3 to 4 days.  He was in a hospital a few months ago with similar presentation and was noted to have a pancreatic  ductal stone, seen on MRCP, which not appear to have been removed.  Here today continues to have epigastric tenderness on exam.  CT scan shows again peripancreatic inflammation as well as pancreatic retained duct stone.  His lipase is 168.  He was otherwise clinically well-appearing on exam.  We spoke to the GI team and they recommended medical admission, they will determine best course of action once in the hospital.  Patient made aware  of this plan and in agreement to hospitalization   [MT]    Clinical Course User Index [MT] Trifan, Kermit Balo, MD   MDM Rules/Calculators/A&P                         59 year old male with history of acute pancreatitis related to pancreatic ductal stone 2 months ago presents to the ED for evaluation of return of his epigastric and periumbilical abdominal pain for the past 3 to 4 days that feels very similar to his previous episode of pancreatitis.  He is also noted that his stools have been dark he has had nausea and one episode of vomiting.  On arrival he appears uncomfortable but in no acute distress.  Patient is hemodynamically stable.  Will get abdominal labs, as well as type and screen and Hemoccult given reported dark stools.  No prior history of GI bleeding, patient is not on anticoagulation.  Does have history of heavy alcohol use but has significantly reduced this since most recent episode of pancreatitis.  Will also get CT abdomen pelvis.  I have independently ordered, reviewed and interpreted all labs and imaging: CBC: No leukocytosis, hemoglobin of 16.5 CMP: Glucose of 300, no other significant electrolyte derangements, normal renal function, AST 60, ALT 86, all other LFTs normal. Lipase: 168 Hemoccult: Positive  CT abdomen pelvis: Acute pancreatitis is again identified, although degree of peripancreatic edema is worsened persistent pancreatic duct stone with ductal dilatation noted.  Case discussed with Dr. Russella Dar with [facility] who agrees with plan for  hospitalist admission, patient seems hemodynamically stable with GI bleeding at this point, he will plan to see the patient in the morning in consult, but is available overnight if patient begins to have signs of increased blood loss or becomes unstable.  After seeing the patient tomorrow they will determine plan for pancreatic ductal stone.  Case discussed with Dr. Leafy Half with [facility] hospitalist who will see and admit the patient.  Final Clinical Impression(s) / ED Diagnoses Final diagnoses:  Acute recurrent pancreatitis  Pancreatic duct calculus  Occult blood in stools    Rx / DC Orders ED Discharge Orders    None       Legrand Rams 07/31/19 2047    Terald Sleeper, MD 08/01/19 1026

## 2019-07-31 NOTE — H&P (Signed)
History and Physical    Elgie Maziarz VPX:[id number] DOB: 10/28/1960 DOA: 07/31/2019  PCP: System, Pcp Not In  Patient coming from: Home   Chief Complaint:  Chief Complaint  Patient presents with  . Abdominal Pain     HPI:    59 year old male with past medical history of diabetes mellitus type 2, alcohol abuse, hypertension, hyperlipidemia, nicotine dependence who presents to [facility] emergency department with complaints of abdominal pain.  Patient explains approximately 5 days ago he began to experience epigastric pain.  This epigastric pain was sharp in quality, severe in intensity and radiating around the abdomen in a bandlike distribution.  Pain is associated with nausea and occasional nonbilious nonbloody vomiting.  Pain is also been associated with inability to tolerate oral intake.  Over the neck several days this patient's symptoms persisted he began to develop progressively worsening generalized weakness.  Approximately 3 days prior to his presentation, the patient also complains of episodes of black stools, occurring at least 3 times daily.  Upon further questioning however the patient also reports taking Pepto-Bismol in the past several days due to his epigastric pains.  Patient denies any hematemesis or frank blood in his stool.  Patient also admits to continued alcohol use although he states that he has cut back significantly to only 1 beer every other day.  Patient symptoms continue to worsen until he eventually presented to [facility] emergency department for evaluation.  Upon evaluation in the emergency department CT imaging of the abdomen revealed persisting stone in the pancreatic duct as well as significant peripancreatic edema and inflammation consistent with pancreatitis that may be worse when compared to previous hospitalization in April.  Patient was administered 40 mg of IV Protonix due to reports of possible melena.  Stool Hemoccult was  positive in the emergency department.  Case was discussed with Dr. Fuller Plan with [facility] who recommended usual care of intravenous fluids, n.p.o. and agreed with IV PPI and stated that he will see patient in the morning.  Hospitalist group was then called to assess the patient for admission to the hospital.   Review of Systems: A 10-system review of systems has been performed and all systems are negative with the exception of what is listed in the HPI.    Past Medical History:  Diagnosis Date  . Alcohol abuse 06/14/2019  . Diabetes mellitus without complication ([place])   . Essential hypertension 06/14/2019  . Mixed hyperlipidemia due to type 2 diabetes mellitus ([place]) 07/31/2019  . Nicotine dependence, cigarettes, uncomplicated 4/62/7035  . Pancreatitis   . Uncontrolled type 2 diabetes mellitus with diabetic polyneuropathy, with long-term current use of insulin ([place]) 07/31/2019    Past Surgical History:  Procedure Laterality Date  . CORONARY ANGIOPLASTY WITH STENT PLACEMENT       reports that he has been smoking cigarettes. He has been smoking about 0.25 packs per day. He has never used smokeless tobacco. He reports current alcohol use. He reports that he does not use drugs.  Allergies  Allergen Reactions  . Iohexol Hives    Patient broke out in hives after injection of Omni 300, will need 13 hour pre-med in future  . Lisinopril Cough  . Simvastatin Itching and Nausea Only    Family History  Problem Relation Age of Onset  . Other Neg Hx      Prior to Admission medications   Medication Sig Start Date End Date Taking? Authorizing Provider  aspirin 81 MG tablet Take 81  mg by mouth daily.   Yes [provider]  atorvastatin (LIPITOR) 80 MG tablet Take 80 mg by mouth every evening.   Yes [provider]  buPROPion (WELLBUTRIN SR) 150 MG 12 hr tablet Take 150 mg by mouth in the morning and at bedtime.   Yes [provider]  carvedilol (COREG CR) 20 MG 24 hr  capsule Take 20 mg by mouth daily.   Yes [provider]  gabapentin (NEURONTIN) 300 MG capsule Take 300 mg by mouth in the morning and at bedtime.   Yes [provider]  insulin aspart (NOVOLOG) 100 UNIT/ML injection Inject 64 Units into the skin in the morning.   Yes [provider]  lipase/protease/amylase (CREON) 36000 UNITS CPEP capsule Take 1 capsule (36,000 Units total) by mouth 3 (three) times daily before meals. 06/17/19  Yes Roberto Scales D, MD  losartan (COZAAR) 50 MG tablet Take 25 mg by mouth daily.   Yes [provider]  Multiple Vitamin (QUINTABS) TABS Take 1 tablet by mouth daily. 11/22/18  Yes [provider]  thiamine 100 MG tablet Take 100 mg by mouth daily. 11/22/18  Yes [provider]  zolpidem (AMBIEN) 10 MG tablet Take 10 mg by mouth at bedtime as needed for sleep.   Yes [provider]    Physical Exam: Vitals:   07/31/19 1745 07/31/19 1821 07/31/19 1900 07/31/19 2000  BP: 129/75 (!) 159/102 (!) 154/102 (!) 165/106  Pulse: 80 77 84 86  Resp: 18 18 (!) 24 (!) 24  Temp:      TempSrc:      SpO2: 99% 99% 98% 98%    Constitutional: Acute alert and oriented x3, patient is in mild distress due to abdominal pain Skin: no rashes, no lesions, somewhat poor skin turgor noted. Eyes: Pupils are equally reactive to light.  No evidence of scleral icterus or conjunctival pallor.  ENMT: Dry mucous membranes noted.  Posterior pharynx clear of any exudate or lesions.   Neck: normal, supple, no masses, no thyromegaly.  No evidence of jugular venous distension.   Respiratory: clear to auscultation bilaterally, no wheezing, no crackles. Normal respiratory effort. No accessory muscle use.  Cardiovascular: Regular rate and rhythm, no murmurs / rubs / gallops. No extremity edema. 2+ pedal pulses. No carotid bruits.  Chest:   Nontender without crepitus or deformity.   Back:   Nontender without crepitus or deformity. Abdomen:  Diffuse abdominal tenderness noted, worst in the epigastric region.  Abdomen is soft however.  No evidence of intra-abdominal masses.  Positive bowel sounds noted in all quadrants.   Musculoskeletal: No joint deformity upper and lower extremities. Good ROM, no contractures. Normal muscle tone.  Neurologic: CN 2-12 grossly intact. Sensation intact, strength noted to be 5 out of 5 in all 4 extremities.  Patient is following all commands.  Patient is responsive to verbal stimuli.   Psychiatric: Patient presents as a normal mood with appropriate affect.  Patient seems to possess insight as to theircurrent situation.     Labs on Admission: I have personally reviewed following labs and imaging studies -   CBC: Recent Labs  Lab 07/31/19 1651  WBC 9.9  HGB 16.5  HCT 49.3  MCV 95.7  PLT 165   Basic Metabolic Panel: Recent Labs  Lab 07/31/19 1651  NA 137  K 4.7  CL 101  CO2 22  GLUCOSE 300*  BUN 10  CREATININE 0.70  CALCIUM 9.3   GFR: CrCl cannot be calculated (  Unknown ideal weight.). Liver Function Tests: Recent Labs  Lab 07/31/19 1651  AST 60*  ALT 86*  ALKPHOS 80  BILITOT 1.2  PROT 7.7  ALBUMIN 3.9   Recent Labs  Lab 07/31/19 1651  LIPASE 168*   No results for input(s): AMMONIA in the last 168 hours. Coagulation Profile: No results for input(s): INR, PROTIME in the last 168 hours. Cardiac Enzymes: No results for input(s): CKTOTAL, CKMB, CKMBINDEX, TROPONINI in the last 168 hours. BNP (last 3 results) No results for input(s): PROBNP in the last 8760 hours. HbA1C: No results for input(s): HGBA1C in the last 72 hours. CBG: No results for input(s): GLUCAP in the last 168 hours. Lipid Profile: No results for input(s): CHOL, HDL, LDLCALC, TRIG, CHOLHDL, LDLDIRECT in the last 72 hours. Thyroid Function Tests: No results for input(s): TSH, T4TOTAL, FREET4, T3FREE, THYROIDAB in the last 72 hours. Anemia Panel: No results for input(s): VITAMINB12, FOLATE, FERRITIN,  TIBC, IRON, RETICCTPCT in the last 72 hours. Urine analysis:    Component Value Date/Time   COLORURINE YELLOW 06/14/2019 0910   APPEARANCEUR CLEAR 06/14/2019 0910   LABSPEC >1.046 (H) 06/14/2019 0910   PHURINE 5.0 06/14/2019 0910   GLUCOSEU >=500 (A) 06/14/2019 0910   HGBUR SMALL (A) 06/14/2019 0910   BILIRUBINUR NEGATIVE 06/14/2019 0910   KETONESUR 80 (A) 06/14/2019 0910   PROTEINUR NEGATIVE 06/14/2019 0910   NITRITE NEGATIVE 06/14/2019 0910   LEUKOCYTESUR NEGATIVE 06/14/2019 0910    Radiological Exams on Admission - Personally Reviewed: CT ABDOMEN PELVIS WO CONTRAST  Result Date: 07/31/2019 CLINICAL DATA:  Upper abdominal pain with nausea for several days, history of pancreatitis EXAM: CT ABDOMEN AND PELVIS WITHOUT CONTRAST TECHNIQUE: Multidetector CT imaging of the abdomen and pelvis was performed following the standard protocol without IV contrast. COMPARISON:  06/14/2019 FINDINGS: Lower chest: No acute abnormality. Hepatobiliary: Fatty infiltration of the liver is noted. The gallbladder is decompressed. Pancreas: Pancreas demonstrates significant peripancreatic inflammatory change consistent with acute pancreatitis. The degree of inflammatory change has increased slightly in the interval from the prior exam. No large pancreatic pseudocyst is noted. Central pancreatic duct calculus is seen with dilatation of the pancreatic duct stable from prior examination. Spleen: Spleen is within normal limits. Adrenals/Urinary Tract: Adrenal glands demonstrate mild hyperplasia stable from the prior study. No renal calculi or obstructive changes are seen. The bladder is partially distended. Stomach/Bowel: Scattered diverticular change of the colon is noted. The appendix is within normal limits. Small bowel is unremarkable. Stomach is unremarkable as well. Vascular/Lymphatic: Aortic atherosclerosis. No enlarged abdominal or pelvic lymph nodes. Reproductive: Prostate is unremarkable. Other: No abdominal wall  hernia or abnormality. No abdominopelvic ascites. Musculoskeletal: No acute or significant osseous findings. IMPRESSION: Acute pancreatitis is again identified although the degree of peripancreatic edema has increased when compared with the prior exam. 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 Fatty liver. Electronically Signed   By: Alcide Clever M.D.   On: 07/31/2019 18:02    Telemetry: Personally reviewed.  Rhythm is normal sinus rhythm with heart rate of 85.  No dynamic ST segment changes appreciated.  Assessment/Plan Principal Problem:   Acute pancreatitis   Patient is presenting with CT evidence of recurrent pancreatitis with persisting pancreatic stone  Lipase additionally slightly elevated  Etiology is likely due to continued alcohol use, possibly with some contribution of the patient's retained pancreatic duct stone.  I have explained to the patient that despite him cutting back he must completely cease his use of alcohol.  Patient has been made n.p.o. with exception of ice chips and medications.  Hydrating patient with intravenous lactated Ringer solution  As needed opiate-based analgesics for associated substantial pain  Case is already been discussed with Dr. Russella Dar with [facility] by the ER Staff, GI will formally see the patient in the morning.  Active Problems:   Pancreatic duct stones   Pancreatic stone identified during recent hospitalization for pancreatitis in April -at the time GI recommended conservative management.  Repeat CT imaging of the abdomen during this hospitalization reveals persisting stone  Appreciate gastroenterology's recommendations concerning further management of this.    Complaint of melena   While patient complains of black stool over the past several days he also endorses use of Pepto-Bismol  Despite his positive Hemoccult here in the emergency department, hemoglobin is normal, although  it is understood that patient is hemoconcentrated due to volume depletion.  Monitor hemoglobin and hematocrit with serial CBCs  Protonix 40 mg IV every 12 for now  If patient does not exhibit evidence of active bleeding during this hospitalization I anticipate this can be worked up further via endoscopy in the outpatient setting.,  Appreciate GIs recommendations on this.    Alcohol abuse   While patient reports cutting back on alcohol use this is still the most likely etiology of the patient's recurrent pancreatitis exacerbated by the patient's retained stone.  Counseling patient on cessation  CIWA protocol initiated to monitor for any evidence of withdrawal  Thiamine, folate, MVI ordered    Essential hypertension   Continue home regimen of antihypertensive therapy    Uncontrolled type 2 diabetes mellitus with diabetic polyneuropathy, with long-term current use of insulin (HCC)   Most recent hemoglobin A1c of 11% suggestive of extremely poor control.  Odd regimen of monotherapy with short acting insulin in the outpatient setting  While patient is n.p.o. we will place patient on Lantus 10 units nightly with Accu-Cheks every 6 hours with sliding scale insulin  Once patient is able to tolerate oral intake patient would most likely benefit from transitioning to a true basal bolus insulin therapy such as a combination of Lantus and Humalog    Mixed hyperlipidemia due to type 2 diabetes mellitus (HCC)   Continue home regimen of statin therapy    Nicotine dependence, cigarettes, uncomplicated   Counseling on smoking cessation  Nicotine replacement therapy initiated    Code Status:  Full code Family Communication: Deferred  Status is: Observation  The patient remains OBS appropriate and will d/c before 2 midnights.  Dispo: The patient is from: Home              Anticipated d/c is to: Home              Anticipated d/c date is: 2 days              Patient currently is  not medically stable to d/c.        Marinda Elk MD [facility] Pager [phone]  If 7PM-7AM, please contact night-coverage [email or web address] Use universal [place] password for that web site. If you do not have the password, please call the hospital operator.  07/31/2019, 9:03 PM

## 2019-07-31 NOTE — ED Triage Notes (Addendum)
Per EMS, history of pancreatis, complaining of abdominal pain, nausea, and dark stools for 4 days-out of pain meds for 1 week-CBG 300-4 mg of Zofran and 500 cc bolus of NS given in route

## 2019-07-31 NOTE — ED Notes (Signed)
Pt to CT

## 2019-08-01 ENCOUNTER — Other Ambulatory Visit: Payer: Self-pay

## 2019-08-01 DIAGNOSIS — Z79899 Other long term (current) drug therapy: Secondary | ICD-10-CM | POA: Diagnosis not present

## 2019-08-01 DIAGNOSIS — Z20822 Contact with and (suspected) exposure to covid-19: Secondary | ICD-10-CM | POA: Diagnosis present

## 2019-08-01 DIAGNOSIS — K852 Alcohol induced acute pancreatitis without necrosis or infection: Secondary | ICD-10-CM | POA: Diagnosis present

## 2019-08-01 DIAGNOSIS — K921 Melena: Secondary | ICD-10-CM | POA: Diagnosis present

## 2019-08-01 DIAGNOSIS — Z7982 Long term (current) use of aspirin: Secondary | ICD-10-CM | POA: Diagnosis not present

## 2019-08-01 DIAGNOSIS — K860 Alcohol-induced chronic pancreatitis: Secondary | ICD-10-CM

## 2019-08-01 DIAGNOSIS — E869 Volume depletion, unspecified: Secondary | ICD-10-CM | POA: Diagnosis present

## 2019-08-01 DIAGNOSIS — R195 Other fecal abnormalities: Secondary | ICD-10-CM | POA: Diagnosis not present

## 2019-08-01 DIAGNOSIS — E1169 Type 2 diabetes mellitus with other specified complication: Secondary | ICD-10-CM

## 2019-08-01 DIAGNOSIS — E1165 Type 2 diabetes mellitus with hyperglycemia: Secondary | ICD-10-CM | POA: Diagnosis present

## 2019-08-01 DIAGNOSIS — K8689 Other specified diseases of pancreas: Secondary | ICD-10-CM | POA: Diagnosis present

## 2019-08-01 DIAGNOSIS — E1142 Type 2 diabetes mellitus with diabetic polyneuropathy: Secondary | ICD-10-CM

## 2019-08-01 DIAGNOSIS — Z955 Presence of coronary angioplasty implant and graft: Secondary | ICD-10-CM | POA: Diagnosis not present

## 2019-08-01 DIAGNOSIS — I10 Essential (primary) hypertension: Secondary | ICD-10-CM | POA: Diagnosis present

## 2019-08-01 DIAGNOSIS — K859 Acute pancreatitis without necrosis or infection, unspecified: Secondary | ICD-10-CM | POA: Diagnosis not present

## 2019-08-01 DIAGNOSIS — K760 Fatty (change of) liver, not elsewhere classified: Secondary | ICD-10-CM | POA: Diagnosis present

## 2019-08-01 DIAGNOSIS — Z794 Long term (current) use of insulin: Secondary | ICD-10-CM | POA: Diagnosis not present

## 2019-08-01 DIAGNOSIS — E782 Mixed hyperlipidemia: Secondary | ICD-10-CM

## 2019-08-01 DIAGNOSIS — F17210 Nicotine dependence, cigarettes, uncomplicated: Secondary | ICD-10-CM | POA: Diagnosis present

## 2019-08-01 DIAGNOSIS — F1010 Alcohol abuse, uncomplicated: Secondary | ICD-10-CM | POA: Diagnosis present

## 2019-08-01 LAB — COMPREHENSIVE METABOLIC PANEL
ALT: 64 U/L — ABNORMAL HIGH (ref 0–44)
AST: 41 U/L (ref 15–41)
Albumin: 3.6 g/dL (ref 3.5–5.0)
Alkaline Phosphatase: 68 U/L (ref 38–126)
Anion gap: 13 (ref 5–15)
BUN: 9 mg/dL (ref 6–20)
CO2: 21 mmol/L — ABNORMAL LOW (ref 22–32)
Calcium: 9 mg/dL (ref 8.9–10.3)
Chloride: 103 mmol/L (ref 98–111)
Creatinine, Ser: 0.58 mg/dL — ABNORMAL LOW (ref 0.61–1.24)
GFR calc Af Amer: >60 mL/min (ref >60)
GFR calc non Af Amer: >60 mL/min (ref >60)
Glucose, Bld: 171 mg/dL — ABNORMAL HIGH (ref 70–99)
Potassium: 4.3 mmol/L (ref 3.5–5.1)
Sodium: 137 mmol/L (ref 135–145)
Total Bilirubin: 1 mg/dL (ref 0.3–1.2)
Total Protein: 6.8 g/dL (ref 6.5–8.1)

## 2019-08-01 LAB — CBC WITH DIFFERENTIAL/PLATELET
Abs Immature Granulocytes: 0.05 10*3/uL (ref 0.00–0.07)
Basophils Absolute: 0 10*3/uL (ref 0.0–0.1)
Basophils Relative: 0 %
Eosinophils Absolute: 0.1 10*3/uL (ref 0.0–0.5)
Eosinophils Relative: 1 %
HCT: 44.9 % (ref 39.0–52.0)
Hemoglobin: 14.9 g/dL (ref 13.0–17.0)
Immature Granulocytes: 1 %
Lymphocytes Relative: 17 %
Lymphs Abs: 1.5 10*3/uL (ref 0.7–4.0)
MCH: 31.6 pg (ref 26.0–34.0)
MCHC: 33.2 g/dL (ref 30.0–36.0)
MCV: 95.1 fL (ref 80.0–100.0)
Monocytes Absolute: 0.8 10*3/uL (ref 0.1–1.0)
Monocytes Relative: 9 %
Neutro Abs: 6.4 10*3/uL (ref 1.7–7.7)
Neutrophils Relative %: 72 %
Platelets: 157 10*3/uL (ref 150–400)
RBC: 4.72 MIL/uL (ref 4.22–5.81)
RDW: 13.3 % (ref 11.5–15.5)
WBC: 8.8 10*3/uL (ref 4.0–10.5)
nRBC: 0 % (ref 0.0–0.2)

## 2019-08-01 LAB — LIPID PANEL
Cholesterol: 190 mg/dL (ref 0–200)
HDL: 43 mg/dL (ref >40)
LDL Cholesterol: 124 mg/dL — ABNORMAL HIGH (ref 0–99)
Total CHOL/HDL Ratio: 4.4 RATIO
Triglycerides: 113 mg/dL (ref <150)
VLDL: 23 mg/dL (ref 0–40)

## 2019-08-01 LAB — CBG MONITORING, ED
Glucose-Capillary: 201 mg/dL — ABNORMAL HIGH (ref 70–99)
Glucose-Capillary: 179 mg/dL — ABNORMAL HIGH (ref 70–99)
Glucose-Capillary: 183 mg/dL — ABNORMAL HIGH (ref 70–99)
Glucose-Capillary: 152 mg/dL — ABNORMAL HIGH (ref 70–99)

## 2019-08-01 LAB — GLUCOSE, CAPILLARY
Glucose-Capillary: 130 mg/dL — ABNORMAL HIGH (ref 70–99)
Glucose-Capillary: 96 mg/dL (ref 70–99)

## 2019-08-01 LAB — MAGNESIUM: Magnesium: 1.9 mg/dL (ref 1.7–2.4)

## 2019-08-01 LAB — LIPASE, BLOOD: Lipase: 683 U/L — ABNORMAL HIGH (ref 11–51)

## 2019-08-01 LAB — ABO/RH: ABO/RH(D): O POS

## 2019-08-01 MED ORDER — LORAZEPAM 1 MG PO TABS: 1.0000 mg | ORAL_TABLET | ORAL | Status: DC | PRN

## 2019-08-01 MED ORDER — ONDANSETRON HCL 4 MG/2ML IJ SOLN: 4.0000 mg | Freq: Four times a day (QID) | INTRAMUSCULAR | Status: DC | PRN

## 2019-08-01 MED ORDER — LOSARTAN POTASSIUM 25 MG PO TABS
25.0000 mg | ORAL_TABLET | Freq: Every day | ORAL | Status: DC
  Administered 2019-08-01 – 2019-08-02 (×2): 25 mg via ORAL
  Filled 2019-08-01 (×2): qty 1

## 2019-08-01 MED ORDER — ONDANSETRON HCL 4 MG PO TABS: 4.0000 mg | ORAL_TABLET | Freq: Four times a day (QID) | ORAL | Status: DC | PRN

## 2019-08-01 MED ORDER — ACETAMINOPHEN 325 MG PO TABS: 650.0000 mg | ORAL_TABLET | Freq: Four times a day (QID) | ORAL | Status: DC | PRN

## 2019-08-01 MED ORDER — CARVEDILOL PHOSPHATE ER 20 MG PO CP24
20.0000 mg | ORAL_CAPSULE | Freq: Every day | ORAL | Status: DC
  Administered 2019-08-01 – 2019-08-03 (×3): 20 mg via ORAL
  Filled 2019-08-01 (×3): qty 1

## 2019-08-01 MED ORDER — ZOLPIDEM TARTRATE 10 MG PO TABS: 10.0000 mg | ORAL_TABLET | Freq: Every evening | ORAL | Status: DC | PRN

## 2019-08-01 MED ORDER — LORAZEPAM 2 MG/ML IJ SOLN: 1.0000 mg | INTRAMUSCULAR | Status: DC | PRN

## 2019-08-01 MED ORDER — LACTATED RINGERS IV SOLN: INTRAVENOUS | Status: AC

## 2019-08-01 MED ORDER — ACETAMINOPHEN 650 MG RE SUPP: 650.0000 mg | Freq: Four times a day (QID) | RECTAL | Status: DC | PRN

## 2019-08-01 MED ORDER — GABAPENTIN 300 MG PO CAPS
300.0000 mg | ORAL_CAPSULE | Freq: Two times a day (BID) | ORAL | Status: DC
  Administered 2019-08-01 – 2019-08-03 (×5): 300 mg via ORAL
  Filled 2019-08-01 (×5): qty 1

## 2019-08-01 MED ORDER — ATORVASTATIN CALCIUM 40 MG PO TABS
80.0000 mg | ORAL_TABLET | Freq: Every evening | ORAL | Status: DC
  Administered 2019-08-02: 80 mg via ORAL
  Filled 2019-08-01: qty 2

## 2019-08-01 MED ORDER — ASPIRIN EC 81 MG PO TBEC
81.0000 mg | DELAYED_RELEASE_TABLET | Freq: Every day | ORAL | Status: DC
  Administered 2019-08-01 – 2019-08-03 (×3): 81 mg via ORAL
  Filled 2019-08-01 (×3): qty 1

## 2019-08-01 MED ORDER — ADULT MULTIVITAMIN W/MINERALS CH
1.0000 | ORAL_TABLET | Freq: Every day | ORAL | Status: DC
  Administered 2019-08-01 – 2019-08-03 (×3): 1 via ORAL
  Filled 2019-08-01 (×3): qty 1

## 2019-08-01 MED ORDER — BUPROPION HCL ER (SR) 150 MG PO TB12
150.0000 mg | ORAL_TABLET | Freq: Two times a day (BID) | ORAL | Status: DC
  Administered 2019-08-01 – 2019-08-03 (×5): 150 mg via ORAL
  Filled 2019-08-01 (×5): qty 1

## 2019-08-01 MED ORDER — NICOTINE 7 MG/24HR TD PT24
7.0000 mg | MEDICATED_PATCH | Freq: Every day | TRANSDERMAL | Status: DC
  Administered 2019-08-01 – 2019-08-03 (×3): 7 mg via TRANSDERMAL
  Filled 2019-08-01 (×3): qty 1

## 2019-08-01 MED ORDER — LABETALOL HCL 5 MG/ML IV SOLN
10.0000 mg | INTRAVENOUS | Status: DC | PRN
  Administered 2019-08-02: 10 mg via INTRAVENOUS
  Filled 2019-08-01: qty 4

## 2019-08-01 MED ORDER — PANCRELIPASE (LIP-PROT-AMYL) 12000-38000 UNITS PO CPEP
36000.0000 [IU] | ORAL_CAPSULE | Freq: Three times a day (TID) | ORAL | Status: DC
  Administered 2019-08-01 – 2019-08-03 (×8): 36000 [IU] via ORAL
  Filled 2019-08-01 (×2): qty 3
  Filled 2019-08-01: qty 1
  Filled 2019-08-01 (×4): qty 3
  Filled 2019-08-01 (×2): qty 1

## 2019-08-01 MED ORDER — FOLIC ACID 1 MG PO TABS
1.0000 mg | ORAL_TABLET | Freq: Every day | ORAL | Status: DC
  Administered 2019-08-01 – 2019-08-03 (×3): 1 mg via ORAL
  Filled 2019-08-01 (×3): qty 1

## 2019-08-01 MED ORDER — THIAMINE HCL 100 MG/ML IJ SOLN: 100.0000 mg | Freq: Every day | INTRAMUSCULAR | Status: DC

## 2019-08-01 MED ORDER — POLYETHYLENE GLYCOL 3350 17 G PO PACK: 17.0000 g | PACK | Freq: Every day | ORAL | Status: DC | PRN

## 2019-08-01 MED ORDER — THIAMINE HCL 100 MG PO TABS
100.0000 mg | ORAL_TABLET | Freq: Every day | ORAL | Status: DC
  Administered 2019-08-01 – 2019-08-03 (×3): 100 mg via ORAL
  Filled 2019-08-01 (×3): qty 1

## 2019-08-01 NOTE — ED Notes (Signed)
Pt called out for pain medicine. 

## 2019-08-01 NOTE — ED Notes (Signed)
ED TO INPATIENT HANDOFF REPORT  Name/Age/Gender Ricardo Howard 59 y.o. male  Code Status    Code Status Orders  (From admission, onward)         Start     Ordered   08/01/19 0447  Full code  Continuous        08/01/19 0447        Code Status History    Date Active Date Inactive Code Status Order ID Comments User Context   06/14/2019 1621 06/17/2019 2205 Full Code [id number]  Aline August, MD ED   Advance Care Planning Activity    Advance Directive Documentation     Most Recent Value  Type of Advance Directive Healthcare Power of [place], Living will  Pre-existing out of facility DNR order (yellow form or pink MOST form) --  "MOST" Form in Place? --      Home/SNF/Other Home  Chief Complaint Acute pancreatitis [K85.90]  Level of Care/Admitting Diagnosis ED Disposition    ED Disposition Condition [place]: [place] [[id number]]  Level of Care: Telemetry [5]  Admit to tele based on following criteria: Monitor QTC interval  Covid Evaluation: Confirmed COVID Negative  Admission Type: Urgent [2]  Diagnosis: Acute pancreatitis [577.0.ICD-9-CM]  Admitting Physician: Jonnie Finner [[id number]]  Attending Physician: Jonnie Finner [[id number]]  Estimated length of stay: past midnight tomorrow  Certification:: I certify this patient will need inpatient services for at least 2 midnights       Medical History Past Medical History:  Diagnosis Date  . Alcohol abuse 06/14/2019  . Diabetes mellitus without complication ([place])   . Essential hypertension 06/14/2019  . Mixed hyperlipidemia due to type 2 diabetes mellitus ([place]) 07/31/2019  . Nicotine dependence, cigarettes, uncomplicated 6/73/4193  . Pancreatitis   . Uncontrolled type 2 diabetes mellitus with diabetic polyneuropathy, with long-term current use of insulin ([place]) 07/31/2019    Allergies Allergies  Allergen Reactions  . Iohexol Hives    Patient broke out in hives after  injection of Omni 300, will need 13 hour pre-med in future  . Lisinopril Cough  . Simvastatin Itching and Nausea Only    IV Location/Drains/Wounds Patient Lines/Drains/Airways Status    Active Line/Drains/Airways    Name Placement date Placement time Site Days   Peripheral IV 07/31/19 Right Antecubital 07/31/19  --  Antecubital  1          Labs/Imaging Results for orders placed or performed during the hospital encounter of 07/31/19 (from the past 48 hour(s))  Type and screen [place]     Status: None   Collection Time: 07/31/19  4:45 PM  Result Value Ref Range   ABO/RH(D) O POS    Antibody Screen NEG    Sample Expiration      08/03/2019,2359 Performed at [facility], [place] [address], [place], [place] [address]   Comprehensive metabolic panel     Status: Abnormal   Collection Time: 07/31/19  4:51 PM  Result Value Ref Range   Sodium 137 135 - 145 mmol/L   Potassium 4.7 3.5 - 5.1 mmol/L   Chloride 101 98 - 111 mmol/L   CO2 22 22 - 32 mmol/L   Glucose, Bld 300 (H) 70 - 99 mg/dL    Comment: Glucose reference range applies only to samples taken after fasting for at least 8 hours.   BUN 10 6 - 20 mg/dL   Creatinine, Ser 0.70 0.61 - 1.24 mg/dL  Calcium 9.3 8.9 - 10.3 mg/dL   Total Protein 7.7 6.5 - 8.1 g/dL   Albumin 3.9 3.5 - 5.0 g/dL   AST 60 (H) 15 - 41 U/L   ALT 86 (H) 0 - 44 U/L   Alkaline Phosphatase 80 38 - 126 U/L   Total Bilirubin 1.2 0.3 - 1.2 mg/dL   GFR calc non Af Amer >60 >60 mL/min   GFR calc Af Amer >60 >60 mL/min   Anion gap 14 5 - 15    Comment: Performed at [facility], [address], [place], [place] [address]  CBC     Status: None   Collection Time: 07/31/19  4:51 PM  Result Value Ref Range   WBC 9.9 4.0 - 10.5 K/uL   RBC 5.15 4.22 - 5.81 MIL/uL   Hemoglobin 16.5 13.0 - 17.0 g/dL   HCT 03.0 39 - 52 %   MCV 95.7 80.0 - 100.0 fL   MCH 32.0 26.0 - 34.0 pg   MCHC 33.5 30.0 - 36.0 g/dL    RDW 09.2 33.0 - 07.6 %   Platelets 165 150 - 400 K/uL   nRBC 0.0 0.0 - 0.2 %    Comment: Performed at [facility], [address] [place], [place] [address]  Lipase, blood     Status: Abnormal   Collection Time: 07/31/19  4:51 PM  Result Value Ref Range   Lipase 168 (H) 11 - 51 U/L    Comment: RESULTS CONFIRMED BY MANUAL DILUTION Performed at [facility], [address] [place], [place] [address]   ABO/Rh     Status: None   Collection Time: 07/31/19  4:51 PM  Result Value Ref Range   ABO/RH(D)      O POS Performed at [facility], [address], [place], [place] [address]   POC occult blood, ED     Status: Abnormal   Collection Time: 07/31/19  5:27 PM  Result Value Ref Range   Fecal Occult Bld POSITIVE (A) NEGATIVE  SARS Coronavirus 2 by RT PCR (hospital order, performed in [facility] hospital lab) Nasopharyngeal Nasopharyngeal Swab     Status: None   Collection Time: 07/31/19  6:34 PM   Specimen: Nasopharyngeal Swab  Result Value Ref Range   SARS Coronavirus 2 NEGATIVE NEGATIVE    Comment: (NOTE) SARS-CoV-2 target nucleic acids are NOT DETECTED.  The SARS-CoV-2 RNA is generally detectable in upper and lower respiratory specimens during the acute phase of infection. The lowest concentration of SARS-CoV-2 viral copies this assay can detect is 250 copies / mL. A negative result does not preclude SARS-CoV-2 infection and should not be used as the sole basis for treatment or other patient management decisions.  A negative result may occur with improper specimen collection / handling, submission of specimen other than nasopharyngeal swab, presence of viral mutation(s) within the areas targeted by this assay, and inadequate number of viral copies (<250 copies / mL). A negative result must be combined with clinical observations, patient history, and epidemiological information.  Fact Sheet for Patients:    BoilerBrush.com.cy  Fact Sheet for Healthcare Providers: https://pope.com/  This test is not yet approved or  cleared by the [place] FDA and has been authorized for detection and/or diagnosis of SARS-CoV-2 by FDA under an Emergency Use Authorization (EUA).  This EUA will remain in effect (meaning this test can be used) for the duration of the COVID-19 declaration under Section 564(b)(1) of  the Act, 21 U.S.C. section 360bbb-3(b)(1), unless the authorization is terminated or revoked sooner.  Performed at [facility], [address] [place], [place] [address]   CBG monitoring, ED     Status: Abnormal   Collection Time: 07/31/19  9:11 PM  Result Value Ref Range   Glucose-Capillary 277 (H) 70 - 99 mg/dL    Comment: Glucose reference range applies only to samples taken after fasting for at least 8 hours.  CBG monitoring, ED     Status: Abnormal   Collection Time: 08/01/19  1:51 AM  Result Value Ref Range   Glucose-Capillary 201 (H) 70 - 99 mg/dL    Comment: Glucose reference range applies only to samples taken after fasting for at least 8 hours.  CBG monitoring, ED     Status: Abnormal   Collection Time: 08/01/19  4:19 AM  Result Value Ref Range   Glucose-Capillary 179 (H) 70 - 99 mg/dL    Comment: Glucose reference range applies only to samples taken after fasting for at least 8 hours.  Lipid panel     Status: Abnormal   Collection Time: 08/01/19  4:51 AM  Result Value Ref Range   Cholesterol 190 0 - 200 mg/dL   Triglycerides 110 <211 mg/dL   HDL 43 >17 mg/dL   Total CHOL/HDL Ratio 4.4 RATIO   VLDL 23 0 - 40 mg/dL   LDL Cholesterol 356 (H) 0 - 99 mg/dL    Comment:        Total Cholesterol/HDL:CHD Risk Coronary Heart Disease Risk Table                     Men   Women  1/2 Average Risk   3.4   3.3  Average Risk       5.0   4.4  2 X Average Risk   9.6   7.1  3 X Average Risk  23.4   11.0        Use  the calculated Patient Ratio above and the CHD Risk Table to determine the patient's CHD Risk.        ATP III CLASSIFICATION (LDL):  <100     mg/dL   Optimal  701-410  mg/dL   Near or Above                    Optimal  130-159  mg/dL   Borderline  301-314  mg/dL   High  >388     mg/dL   Very High Performed at [facility], [address] [place], [place] [address]   Magnesium     Status: None   Collection Time: 08/01/19  4:51 AM  Result Value Ref Range   Magnesium 1.9 1.7 - 2.4 mg/dL    Comment: Performed at [facility], [address], [place], [place] [address]  CBC WITH DIFFERENTIAL     Status: None   Collection Time: 08/01/19  4:51 AM  Result Value Ref Range   WBC 8.8 4.0 - 10.5 K/uL   RBC 4.72 4.22 - 5.81 MIL/uL   Hemoglobin 14.9 13.0 - 17.0 g/dL   HCT 60.1 39 - 52 %   MCV 95.1 80.0 - 100.0 fL   MCH 31.6 26.0 - 34.0 pg   MCHC 33.2 30.0 - 36.0 g/dL   RDW 56.1 53.7 - 94.3 %   Platelets 157 150 - 400 K/uL   nRBC 0.0 0.0 - 0.2 %  Neutrophils Relative % 72 %   Neutro Abs 6.4 1.7 - 7.7 K/uL   Lymphocytes Relative 17 %   Lymphs Abs 1.5 0.7 - 4.0 K/uL   Monocytes Relative 9 %   Monocytes Absolute 0.8 0 - 1 K/uL   Eosinophils Relative 1 %   Eosinophils Absolute 0.1 0 - 0 K/uL   Basophils Relative 0 %   Basophils Absolute 0.0 0 - 0 K/uL   Immature Granulocytes 1 %   Abs Immature Granulocytes 0.05 0.00 - 0.07 K/uL    Comment: Performed at [facility], [address] [place], [place] [address]  Comprehensive metabolic panel     Status: Abnormal   Collection Time: 08/01/19  4:51 AM  Result Value Ref Range   Sodium 137 135 - 145 mmol/L   Potassium 4.3 3.5 - 5.1 mmol/L   Chloride 103 98 - 111 mmol/L   CO2 21 (L) 22 - 32 mmol/L   Glucose, Bld 171 (H) 70 - 99 mg/dL    Comment: Glucose reference range applies only to samples taken after fasting for at least 8 hours.   BUN 9 6 - 20 mg/dL   Creatinine, Ser 1.91 (L)  0.61 - 1.24 mg/dL   Calcium 9.0 8.9 - 47.8 mg/dL   Total Protein 6.8 6.5 - 8.1 g/dL   Albumin 3.6 3.5 - 5.0 g/dL   AST 41 15 - 41 U/L   ALT 64 (H) 0 - 44 U/L   Alkaline Phosphatase 68 38 - 126 U/L   Total Bilirubin 1.0 0.3 - 1.2 mg/dL   GFR calc non Af Amer >60 >60 mL/min   GFR calc Af Amer >60 >60 mL/min   Anion gap 13 5 - 15    Comment: Performed at [facility], [address] [place], [place] [address]  Lipase, blood     Status: Abnormal   Collection Time: 08/01/19  4:51 AM  Result Value Ref Range   Lipase 683 (H) 11 - 51 U/L    Comment: RESULTS CONFIRMED BY MANUAL DILUTION Performed at [facility], [address] [place], [place] [address]   CBG monitoring, ED     Status: Abnormal   Collection Time: 08/01/19  6:27 AM  Result Value Ref Range   Glucose-Capillary 183 (H) 70 - 99 mg/dL    Comment: Glucose reference range applies only to samples taken after fasting for at least 8 hours.  CBG monitoring, ED     Status: Abnormal   Collection Time: 08/01/19  9:01 AM  Result Value Ref Range   Glucose-Capillary 152 (H) 70 - 99 mg/dL    Comment: Glucose reference range applies only to samples taken after fasting for at least 8 hours.   CT ABDOMEN PELVIS WO CONTRAST  Result Date: 07/31/2019 CLINICAL DATA:  Upper abdominal pain with nausea for several days, history of pancreatitis EXAM: CT ABDOMEN AND PELVIS WITHOUT CONTRAST TECHNIQUE: Multidetector CT imaging of the abdomen and pelvis was performed following the standard protocol without IV contrast. COMPARISON:  06/14/2019 FINDINGS: Lower chest: No acute abnormality. Hepatobiliary: Fatty infiltration of the liver is noted. The gallbladder is decompressed. Pancreas: Pancreas demonstrates significant peripancreatic inflammatory change consistent with acute pancreatitis. The degree of inflammatory change has increased slightly in the interval from the prior exam. No large pancreatic pseudocyst is noted.  Central pancreatic duct calculus is seen with dilatation of the pancreatic duct stable from prior examination. Spleen: Spleen is within normal limits. Adrenals/Urinary Tract: Adrenal  glands demonstrate mild hyperplasia stable from the prior study. No renal calculi or obstructive changes are seen. The bladder is partially distended. Stomach/Bowel: Scattered diverticular change of the colon is noted. The appendix is within normal limits. Small bowel is unremarkable. Stomach is unremarkable as well. Vascular/Lymphatic: Aortic atherosclerosis. No enlarged abdominal or pelvic lymph nodes. Reproductive: Prostate is unremarkable. Other: No abdominal wall hernia or abnormality. No abdominopelvic ascites. Musculoskeletal: No acute or significant osseous findings. IMPRESSION: Acute pancreatitis is again identified although the degree of peripancreatic edema has increased when compared with the prior exam. 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 Fatty liver. Electronically Signed   By: Alcide CleverMark  Lukens M.D.   On: 07/31/2019 18:02    Pending Labs Unresulted Labs (From admission, onward) Comment         None      Vitals/Pain Today's Vitals   08/01/19 1330 08/01/19 1400 08/01/19 1430 08/01/19 1500  BP:  (!) 156/94  (!) 155/103  Pulse: 72 74 72 75  Resp: 15 (!) 9 11 20   Temp:      TempSrc:      SpO2: 96% 94% 97% 94%  PainSc:        Isolation Precautions No active isolations  Medications Medications  insulin glargine (LANTUS) injection 10 Units (10 Units Subcutaneous Given 07/31/19 2259)  insulin aspart (novoLOG) injection 0-15 Units (3 Units Subcutaneous Given 08/01/19 0941)  pantoprazole (PROTONIX) injection 40 mg (40 mg Intravenous Given 08/01/19 0651)  aspirin EC tablet 81 mg (81 mg Oral Given 08/01/19 0939)  atorvastatin (LIPITOR) tablet 80 mg (has no administration in time range)  carvedilol (COREG CR) 24 hr capsule 20 mg (20 mg Oral Given 08/01/19  0939)  losartan (COZAAR) tablet 25 mg (25 mg Oral Given 08/01/19 0939)  buPROPion (WELLBUTRIN SR) 12 hr tablet 150 mg (150 mg Oral Given 08/01/19 0939)  zolpidem (AMBIEN) tablet 10 mg (has no administration in time range)  lipase/protease/amylase (CREON) capsule 36,000 Units (36,000 Units Oral Given 08/01/19 1314)  gabapentin (NEURONTIN) capsule 300 mg (300 mg Oral Given 08/01/19 0939)  LORazepam (ATIVAN) tablet 1-4 mg (has no administration in time range)    Or  LORazepam (ATIVAN) injection 1-4 mg (has no administration in time range)  thiamine tablet 100 mg (100 mg Oral Given 08/01/19 0939)    Or  thiamine (B-1) injection 100 mg ( Intravenous See Alternative 08/01/19 0939)  folic acid (FOLVITE) tablet 1 mg (1 mg Oral Given 08/01/19 0939)  multivitamin with minerals tablet 1 tablet (1 tablet Oral Given 08/01/19 0939)  polyethylene glycol (MIRALAX / GLYCOLAX) packet 17 g (has no administration in time range)  ondansetron (ZOFRAN) tablet 4 mg (has no administration in time range)    Or  ondansetron (ZOFRAN) injection 4 mg (has no administration in time range)  acetaminophen (TYLENOL) tablet 650 mg (has no administration in time range)    Or  acetaminophen (TYLENOL) suppository 650 mg (has no administration in time range)  nicotine (NICODERM CQ - dosed in mg/24 hr) patch 7 mg (7 mg Transdermal Patch Applied 08/01/19 0941)  lactated ringers infusion ( Intravenous New Bag/Given 07/31/19 2143)  morphine 2 MG/ML injection 2 mg ( Intravenous See Alternative 08/01/19 0857)    Or  morphine 4 MG/ML injection 4 mg (4 mg Intravenous Given 08/01/19 0857)  sodium chloride 0.9 % bolus 1,000 mL (0 mLs Intravenous Stopped 07/31/19 2021)  ondansetron (ZOFRAN) injection 4 mg (4 mg Intravenous Given 07/31/19 1743)  morphine 4 MG/ML injection  4 mg (4 mg Intravenous Given 07/31/19 1743)  pantoprazole (PROTONIX) injection 40 mg (40 mg Intravenous Given 07/31/19 1743)  morphine 4 MG/ML injection 4 mg (4 mg Intravenous Given  07/31/19 2021)    Mobility walks

## 2019-08-01 NOTE — Consult Note (Addendum)
Referring Provider:  EDP Primary Care Physician:  [place] PCP Primary Gastroenterologist:  Althia Forts  Reason for Consultation:  Pancreatitis  HPI: Ricardo Howard is a 59 y.o. 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  CT of the abdomen and pelvis without contrast showed the following:  IMPRESSION: Acute pancreatitis is again identified although the degree of peripancreatic edema has increased when compared with the prior exam.  Persistent pancreatic duct stone with ductal dilatation  Diverticulosis without diverticulitis.  Fatty liver.  Was admitted for pancreatitis in April of this year and seen by [facility] at which time  CT scan of the abdomen and pelvis with contrast 06/14/2019 showed the following:  IMPRESSION: 1. Pancreatitis. The pancreatitis may be secondary to the apparent stone within the pancreatic duct with adjacent pancreatic ductal dilatation. This stone and dilatation was present previously however. 2. Hepatic steatosis. 3. Adrenal hyperplasia. 4. Significant atherosclerosis in the nonaneurysmal aorta. 5. 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  MRI abdomen/MRCP on 06/15/2019 showed the following:  IMPRESSION: 1. Mild diffuse pancreatic inflammation consistent with known acute pancreatitis. No complicating features such as pancreatic necrosis or pseudocyst. 2. No gallstones or common bile duct stones. Normal caliber and course of the common bile duct. 3. Mild main pancreatic ductal dilatation in the midbody region and head down to a 7 mm calculus in the pancreatic head. This was also present on a prior CT scan from 2020 but at that time it measured 3.5 mm. 4. Mild inflammation involving the descending colon and the third portion of the duodenum. 5. Fatty infiltration of the liver but no focal hepatic lesions or intrahepatic biliary dilatation.  He was treated with supportive  care and improved quickly.  He says that since his hospital discharge he has continued to drink ETOH, but much less than what he was drinking previously.  Began having same abdominal pain as previous that began about 5 or 6 days ago and progressively worsened.  One episode of vomiting in the ambulance on the way here.  Found to have a lipase of 683 early this morning.  Lipid panel was normal.  LFTs are normal except for a slightly elevated ALT at 64.  He also reports black stools.  Was Hemoccult positive.  Does use Pepto-Bismol intermittently.  He had the same report when he was here in April, but was Hemoccult negative so was thought to be due to the Pepto-Bismol.  Hemoglobin continues to remain normal at 14.9 g.   Past Medical History:  Diagnosis Date  . Alcohol abuse 06/14/2019  . Diabetes mellitus without complication ([place])   . Essential hypertension 06/14/2019  . Mixed hyperlipidemia due to type 2 diabetes mellitus ([place]) 07/31/2019  . Nicotine dependence, cigarettes, uncomplicated 4/48/1856  . Pancreatitis   . Uncontrolled type 2 diabetes mellitus with diabetic polyneuropathy, with long-term current use of insulin ([place]) 07/31/2019    Past Surgical History:  Procedure Laterality Date  . CORONARY ANGIOPLASTY WITH STENT PLACEMENT      Prior to Admission medications   Medication Sig Start Date End Date Taking? Authorizing Provider  aspirin 81 MG tablet Take 81 mg by mouth daily.   Yes [provider]  atorvastatin (LIPITOR) 80 MG tablet Take 80 mg by mouth every evening.   Yes [provider]  buPROPion (WELLBUTRIN SR) 150 MG 12 hr tablet Take 150 mg by mouth in the morning and at bedtime.  Yes [provider]  carvedilol (COREG CR) 20 MG 24 hr capsule Take 20 mg by mouth daily.   Yes [provider]  gabapentin (NEURONTIN) 300 MG capsule Take 300 mg by mouth in the morning and at bedtime.   Yes [provider]  insulin aspart (NOVOLOG) 100  UNIT/ML injection Inject 64 Units into the skin in the morning.   Yes [provider]  lipase/protease/amylase (CREON) 36000 UNITS CPEP capsule Take 1 capsule (36,000 Units total) by mouth 3 (three) times daily before meals. 06/17/19  Yes Roberto Scales D, MD  losartan (COZAAR) 50 MG tablet Take 25 mg by mouth daily.   Yes [provider]  Multiple Vitamin (QUINTABS) TABS Take 1 tablet by mouth daily. 11/22/18  Yes [provider]  thiamine 100 MG tablet Take 100 mg by mouth daily. 11/22/18  Yes [provider]  zolpidem (AMBIEN) 10 MG tablet Take 10 mg by mouth at bedtime as needed for sleep.   Yes [provider]    Current Facility-Administered Medications  Medication Dose Route Frequency Provider Last Rate Last Admin  . acetaminophen (TYLENOL) tablet 650 mg  650 mg Oral Q6H PRN Shalhoub, Deno Lunger, MD       Or  . acetaminophen (TYLENOL) suppository 650 mg  650 mg Rectal Q6H PRN Shalhoub, Deno Lunger, MD      . aspirin EC tablet 81 mg  81 mg Oral Daily Shalhoub, Deno Lunger, MD      . atorvastatin (LIPITOR) tablet 80 mg  80 mg Oral QPM Shalhoub, Deno Lunger, MD      . buPROPion [facility] SR) 12 hr tablet 150 mg  150 mg Oral BID Marinda Elk, MD      . carvedilol (COREG CR) 24 hr capsule 20 mg  20 mg Oral Daily Shalhoub, Deno Lunger, MD      . folic acid (FOLVITE) tablet 1 mg  1 mg Oral Daily Shalhoub, Deno Lunger, MD      . gabapentin (NEURONTIN) capsule 300 mg  300 mg Oral BID Shalhoub, Deno Lunger, MD      . insulin aspart (novoLOG) injection 0-15 Units  0-15 Units Subcutaneous Q6H Shalhoub, Deno Lunger, MD   3 Units at 08/01/19 0437  . insulin glargine (LANTUS) injection 10 Units  10 Units Subcutaneous QHS Marinda Elk, MD   10 Units at 07/31/19 2259  . lactated ringers infusion   Intravenous Continuous Marinda Elk, MD 125 mL/hr at 07/31/19 2143 New Bag at 07/31/19 2143  . lipase/protease/amylase (CREON) capsule 36,000 Units  36,000 Units Oral TID  AC Shalhoub, Deno Lunger, MD   36,000 Units at 08/01/19 0859  . LORazepam (ATIVAN) tablet 1-4 mg  1-4 mg Oral Q1H PRN Shalhoub, Deno Lunger, MD       Or  . LORazepam (ATIVAN) injection 1-4 mg  1-4 mg Intravenous Q1H PRN Shalhoub, Deno Lunger, MD      . losartan (COZAAR) tablet 25 mg  25 mg Oral Daily Shalhoub, Deno Lunger, MD      . morphine 2 MG/ML injection 2 mg  2 mg Intravenous Q4H PRN Shalhoub, Deno Lunger, MD       Or  . morphine 4 MG/ML injection 4 mg  4 mg Intravenous Q4H PRN Shalhoub, Deno Lunger, MD   4 mg at 08/01/19 0857  . multivitamin with minerals tablet 1 tablet  1 tablet Oral Daily Shalhoub, Deno Lunger, MD      . nicotine (NICODERM CQ -  dosed in mg/24 hr) patch 7 mg  7 mg Transdermal Daily Shalhoub, Deno LungerGeorge J, MD      . ondansetron [facility](ZOFRAN) tablet 4 mg  4 mg Oral Q6H PRN Shalhoub, Deno LungerGeorge J, MD       Or  . ondansetron [facility](ZOFRAN) injection 4 mg  4 mg Intravenous Q6H PRN Shalhoub, Deno LungerGeorge J, MD      . pantoprazole (PROTONIX) injection 40 mg  40 mg Intravenous Q12H Shalhoub, Deno LungerGeorge J, MD   40 mg at 08/01/19 0651  . polyethylene glycol (MIRALAX / GLYCOLAX) packet 17 g  17 g Oral Daily PRN Shalhoub, Deno LungerGeorge J, MD      . thiamine tablet 100 mg  100 mg Oral Daily Shalhoub, Deno LungerGeorge J, MD       Or  . thiamine (B-1) injection 100 mg  100 mg Intravenous Daily Shalhoub, Deno LungerGeorge J, MD      . zolpidem (AMBIEN) tablet 10 mg  10 mg Oral QHS PRN Shalhoub, Deno LungerGeorge J, MD       Current Outpatient Medications  Medication Sig Dispense Refill  . aspirin 81 MG tablet Take 81 mg by mouth daily.    Marland Kitchen. atorvastatin (LIPITOR) 80 MG tablet Take 80 mg by mouth every evening.    Marland Kitchen. buPROPion (WELLBUTRIN SR) 150 MG 12 hr tablet Take 150 mg by mouth in the morning and at bedtime.    . carvedilol (COREG CR) 20 MG 24 hr capsule Take 20 mg by mouth daily.    Marland Kitchen. gabapentin (NEURONTIN) 300 MG capsule Take 300 mg by mouth in the morning and at bedtime.    . insulin aspart (NOVOLOG) 100 UNIT/ML injection Inject 64 Units into the skin in the  morning.    . lipase/protease/amylase (CREON) 36000 UNITS CPEP capsule Take 1 capsule (36,000 Units total) by mouth 3 (three) times daily before meals. 270 capsule 0  . losartan (COZAAR) 50 MG tablet Take 25 mg by mouth daily.    . Multiple Vitamin (QUINTABS) TABS Take 1 tablet by mouth daily.    Marland Kitchen. thiamine 100 MG tablet Take 100 mg by mouth daily.    Marland Kitchen. zolpidem (AMBIEN) 10 MG tablet Take 10 mg by mouth at bedtime as needed for sleep.      Allergies as of 07/31/2019 - Review Complete 07/31/2019  Allergen Reaction Noted  . Iohexol Hives 06/14/2019  . Lisinopril Cough 05/16/2011  . Simvastatin Itching and Nausea Only 08/04/2013    Family History  Problem Relation Age of Onset  . Other Neg Hx     Social History   Socioeconomic History  . Marital status: Married    Spouse name: Not on file  . Number of children: Not on file  . Years of education: Not on file  . Highest education level: Not on file  Occupational History  . Not on file  Tobacco Use  . Smoking status: Current Every Day Smoker    Packs/day: 0.25    Types: Cigarettes  . Smokeless tobacco: Never Used  Vaping Use  . Vaping Use: Never used  Substance and Sexual Activity  . Alcohol use: Yes  . Drug use: No  . Sexual activity: Not on file  Other Topics Concern  . Not on file  Social History Narrative  . Not on file   Social Determinants of Health   Financial Resource Strain:   . Difficulty of Paying Living Expenses:   Food Insecurity:   . Worried About Programme researcher, broadcasting/film/videounning Out of Food in the Last Year:   .  Ran Out of Food in the Last Year:   Transportation Needs:   . Freight forwarder (Medical):   Marland Kitchen Lack of Transportation (Non-Medical):   Physical Activity:   . Days of Exercise per Week:   . Minutes of Exercise per Session:   Stress:   . Feeling of Stress :   Social Connections:   . Frequency of Communication with Friends and Family:   . Frequency of Social Gatherings with Friends and Family:   . Attends  Religious Services:   . Active Member of Clubs or Organizations:   . Attends Banker Meetings:   Marland Kitchen Marital Status:   Intimate Partner Violence:   . Fear of Current or Ex-Partner:   . Emotionally Abused:   Marland Kitchen Physically Abused:   . Sexually Abused:     Review of Systems: ROS is O/W negative except as mentioned in HPI.  Physical Exam: Vital signs in last 24 hours: Temp:  [97.6 F (36.4 C)] 97.6 F (36.4 C) (06/11 1412) Pulse Rate:  [75-92] 79 (06/12 0842) Resp:  [14-24] 18 (06/12 0842) BP: (118-168)/(75-113) 154/102 (06/12 0842) SpO2:  [94 %-99 %] 98 % (06/12 0842)   General:  Alert, Well-developed, well-nourished, pleasant and cooperative in NAD Head:  Normocephalic and atraumatic. Eyes:  Sclera clear, no icterus.  Conjunctiva pink. Ears:  Normal auditory acuity. Mouth:  No deformity or lesions.   Lungs:  Clear throughout to auscultation.  No wheezes, crackles, or rhonchi.  Heart:  Regular rate and rhythm; no murmurs, clicks, rubs, or gallops. Abdomen:  Soft, non-distended.  BS present.  Upper abdominal TTP.   Rectal:  Deferred.  Hemoccult positive by EDP.  Msk:  Symmetrical without gross deformities. Pulses:  Normal pulses noted. Extremities:  Without clubbing or edema. Neurologic:  Alert and  oriented x4;  grossly normal neurologically. Skin:  Intact without significant lesions or rashes. Psych:  Alert and cooperative. Normal mood and affect.  Lab Results: Recent Labs    07/31/19 1651 08/01/19 0451  WBC 9.9 8.8  HGB 16.5 14.9  HCT 49.3 44.9  PLT 165 157   BMET Recent Labs    07/31/19 1651 08/01/19 0451  NA 137 137  K 4.7 4.3  CL 101 103  CO2 22 21*  GLUCOSE 300* 171*  BUN 10 9  CREATININE 0.70 0.58*  CALCIUM 9.3 9.0   LFT Recent Labs    08/01/19 0451  PROT 6.8  ALBUMIN 3.6  AST 41  ALT 64*  ALKPHOS 68  BILITOT 1.0   Studies/Results: CT ABDOMEN PELVIS WO CONTRAST  Result Date: 07/31/2019 CLINICAL DATA:  Upper abdominal pain  with nausea for several days, history of pancreatitis EXAM: CT ABDOMEN AND PELVIS WITHOUT CONTRAST TECHNIQUE: Multidetector CT imaging of the abdomen and pelvis was performed following the standard protocol without IV contrast. COMPARISON:  06/14/2019 FINDINGS: Lower chest: No acute abnormality. Hepatobiliary: Fatty infiltration of the liver is noted. The gallbladder is decompressed. Pancreas: Pancreas demonstrates significant peripancreatic inflammatory change consistent with acute pancreatitis. The degree of inflammatory change has increased slightly in the interval from the prior exam. No large pancreatic pseudocyst is noted. Central pancreatic duct calculus is seen with dilatation of the pancreatic duct stable from prior examination. Spleen: Spleen is within normal limits. Adrenals/Urinary Tract: Adrenal glands demonstrate mild hyperplasia stable from the prior study. No renal calculi or obstructive changes are seen. The bladder is partially distended. Stomach/Bowel: Scattered diverticular change of the colon is noted. The appendix is within normal limits.  Small bowel is unremarkable. Stomach is unremarkable as well. Vascular/Lymphatic: Aortic atherosclerosis. No enlarged abdominal or pelvic lymph nodes. Reproductive: Prostate is unremarkable. Other: No abdominal wall hernia or abnormality. No abdominopelvic ascites. Musculoskeletal: No acute or significant osseous findings. IMPRESSION: Acute pancreatitis is again identified although the degree of peripancreatic edema has increased when compared with the prior exam. 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 Fatty liver. Electronically Signed   By: Alcide Clever M.D.   On: 07/31/2019 18:02    IMPRESSION:  *Acute pancreatitis:pancreatitis on CT, epigastric abdominal pain, and lipase elevated.  Likely due to ongoing ETOH use. -Pancreatic duct stone with dilationper MRCP 4/27.  MRCP: 40mm calculus in  pancreatic head (increased from 3.76mm in 2020).  Pancreatitis without necrosis or pseudocyst. -daily ETOH use -Normal triglycerides -? Pancreatic stone and ETOH use could be contributing to pancreatitis. -LFTs remain within normal limits except a mildly elevated ALT. -Renal function remains normal *ETOH abuse *Hemoccult positive and reports of black stools:  CBC normal.  Reported black stool at last hospital admission, but was using Pepto-Bismol and was hemoccult negative.  Had colonoscopy years ago.  RECOMMENDATIONS: -Supportive care with IVFs, antiemetics, pain control.  Would consider increasing IV fluid rate again as he is currently only receiving 75 cc an hour.  N.p.o. for now, but if improves then can try clear liquids. -Further consideration of PD stone removal can be reviewed with our advanced endoscopists in the near future as this has been present since 2020 although PD dilation worsening. -We will likely need EGD and colonoscopy for evaluation of reported black stools/Hemoccult positive stools, timing to be determined.  Princella Pellegrini. Zehr  08/01/2019, 9:29 AM     Attending Physician Note   I have taken a history, examined the patient and reviewed the chart. I agree with the Advanced Practitioner's note, impression and recommendations.   Chronic pancreatitis, alcohol related, with acute flare, PD dilation and a PD stone  Alcohol abuse Fatty liver, DM2 and alcohol Mildly elevated transaminases likely due to fatty liver Heme + stool, black stool, recent PeptoBismol use. R/O ulcer, gastritis, colorectal neoplasm DM2 poorly controlled  Bowel rest, appropriate IV hydration, pain/nausea control, supportive care IV PPI  Resume pancreatic enzymes when resumes food Complete alcohol avoidance long term Consider PD stone removal after resolution of acute flare Discontinue use of PeptoBismol Colonoscopy and EGD, timing to be determined based on clinical course  Claudette Head, MD  [facility]

## 2019-08-01 NOTE — Progress Notes (Signed)
PROGRESS NOTE    Ricardo Howard  CWC:[id number] DOB: 04-08-1960 DOA: 07/31/2019 PCP: System, Pcp Not In   Brief Narrative:   59 year old male with past medical history of diabetes mellitus type 2, alcohol abuse, hypertension, hyperlipidemia, nicotine dependence who presents to [facility] emergency department with complaints of abdominal pain.  6/12: Lipase worsening this AM. Ab pain worse on left. GI has eval'd. Continue supportive care and bowel rest. Possible EGD/colonoscopy as he improves. Continue CIWAA.  Assessment & Plan:   Principal Problem:   Acute pancreatitis Active Problems:   Alcohol abuse   Essential hypertension   Pancreatic duct stones   Mixed hyperlipidemia due to type 2 diabetes mellitus (HCC)   Uncontrolled type 2 diabetes mellitus with diabetic polyneuropathy, with long-term current use of insulin (HCC)   Nicotine dependence, cigarettes, uncomplicated   Complaint of melena  Acute pancreatitis Pancreatic duct stones Complaint of melena     - Patient is presenting with CT evidence of recurrent pancreatitis with persisting pancreatic stone     - Etiology is likely due to continued alcohol use, possibly with some contribution of the patient's retained pancreatic duct stone.  I have explained to the patient that despite him cutting back he must completely cease his use of alcohol.     - Patient has been made n.p.o. with exception of ice chips and medications.     - Hydrating patient with intravenous lactated Ringer solution     - As needed opiate-based analgesics for associated substantial pain     - 6/12: GI has evaled. Continue supportive care, protonix, and bowel rest; possible EGD/C-scope as he improves   Alcohol abuse     - While patient reports cutting back on alcohol use this is still the most likely etiology of the patient's recurrent pancreatitis exacerbated by the patient's retained stone.     - 6/12: continue CIWA, thiamine, folate, MVI      - reports 3 12-oz bottle beer daily, denies any history of withdrawal problems/DTs; counseled against further use of EtOH  Essential hypertension     - continue cozaar, coreg     - 6/12 increase cozaar to 50mg   Uncontrolled type 2 diabetes mellitus with diabetic polyneuropathy, with long-term current use of insulin (HCC)     - Most recent hemoglobin A1c of 11% suggestive of extremely poor control.     - Odd regimen of monotherapy with short acting insulin in the outpatient setting     - While patient is n.p.o. we will place patient on Lantus 10 units nightly with Accu-Cheks every 6 hours with sliding scale insulin     - Once patient is able to tolerate oral intake patient would most likely benefit from transitioning to a true basal bolus insulin therapy such as a combination of Lantus and Humalog     - 6/12: glucose is acceptable, follow  Mixed hyperlipidemia due to type 2 diabetes mellitus (HCC)     - continue atrovastatin  Tobacco abuse     - continue nicotine patch     - counseled against further tobacco usage  DVT prophylaxis: SCD Code Status: FULL Family Communication: None at bedside   Status is: Inpatient  Remains inpatient appropriate because:IV treatments appropriate due to intensity of illness or inability to take PO   Dispo: The patient is from: Home              Anticipated d/c is to: Home  Anticipated d/c date is: > 3 days              Patient currently is not medically stable to d/c.  Consultants:   GI  Antimicrobials:  . None   ROS:  Reports ab pain. Denies CP, dyspnea, N, V . Remainder 10-pt ROS is negative for all not previously mentioned.  Subjective: "I've been doing that all my life."  Objective: Vitals:   08/01/19 1400 08/01/19 1430 08/01/19 1500 08/01/19 1608  BP: (!) 156/94  (!) 155/103 (!) 150/102  Pulse: 74 72 75 77  Resp: (!) 9 11 20 18   Temp:    97.8 F (36.6 C)  TempSrc:    Oral  SpO2: 94% 97% 94% 98%   No intake or  output data in the 24 hours ending 08/01/19 1615 There were no vitals filed for this visit.  Examination:  General: 59 y.o. male resting in bed in NAD Cardiovascular: RRR, +S1, S2, no m/g/r, equal pulses throughout Respiratory: CTABL, no w/r/r, normal WOB GI: BS+, ND, TTP RUQ/LUQ, soft MSK: No e/c/c Neuro: A&O x 3, no focal deficits Psyc: Appropriate interaction and affect, calm/cooperative   Data Reviewed: I have personally reviewed following labs and imaging studies.  CBC: Recent Labs  Lab 07/31/19 1651 08/01/19 0451  WBC 9.9 8.8  NEUTROABS  --  6.4  HGB 16.5 14.9  HCT 49.3 44.9  MCV 95.7 95.1  PLT 165 025   Basic Metabolic Panel: Recent Labs  Lab 07/31/19 1651 08/01/19 0451  NA 137 137  K 4.7 4.3  CL 101 103  CO2 22 21*  GLUCOSE 300* 171*  BUN 10 9  CREATININE 0.70 0.58*  CALCIUM 9.3 9.0  MG  --  1.9   GFR: CrCl cannot be calculated (Unknown ideal weight.). Liver Function Tests: Recent Labs  Lab 07/31/19 1651 08/01/19 0451  AST 60* 41  ALT 86* 64*  ALKPHOS 80 68  BILITOT 1.2 1.0  PROT 7.7 6.8  ALBUMIN 3.9 3.6   Recent Labs  Lab 07/31/19 1651 08/01/19 0451  LIPASE 168* 683*   No results for input(s): AMMONIA in the last 168 hours. Coagulation Profile: No results for input(s): INR, PROTIME in the last 168 hours. Cardiac Enzymes: No results for input(s): CKTOTAL, CKMB, CKMBINDEX, TROPONINI in the last 168 hours. BNP (last 3 results) No results for input(s): PROBNP in the last 8760 hours. HbA1C: No results for input(s): HGBA1C in the last 72 hours. CBG: Recent Labs  Lab 07/31/19 2111 08/01/19 0151 08/01/19 0419 08/01/19 0627 08/01/19 0901  GLUCAP 277* 201* 179* 183* 152*   Lipid Profile: Recent Labs    08/01/19 0451  CHOL 190  HDL 43  LDLCALC 124*  TRIG 113  CHOLHDL 4.4   Thyroid Function Tests: No results for input(s): TSH, T4TOTAL, FREET4, T3FREE, THYROIDAB in the last 72 hours. Anemia Panel: No results for input(s):  VITAMINB12, FOLATE, FERRITIN, TIBC, IRON, RETICCTPCT in the last 72 hours. Sepsis Labs: No results for input(s): PROCALCITON, LATICACIDVEN in the last 168 hours.  Recent Results (from the past 240 hour(s))  SARS Coronavirus 2 by RT PCR (hospital order, performed in [facility] hospital lab) Nasopharyngeal Nasopharyngeal Swab     Status: None   Collection Time: 07/31/19  6:34 PM   Specimen: Nasopharyngeal Swab  Result Value Ref Range Status   SARS Coronavirus 2 NEGATIVE NEGATIVE Final    Comment: (NOTE) SARS-CoV-2 target nucleic acids are NOT DETECTED.  The SARS-CoV-2 RNA is generally detectable in upper  and lower respiratory specimens during the acute phase of infection. The lowest concentration of SARS-CoV-2 viral copies this assay can detect is 250 copies / mL. A negative result does not preclude SARS-CoV-2 infection and should not be used as the sole basis for treatment or other patient management decisions.  A negative result may occur with improper specimen collection / handling, submission of specimen other than nasopharyngeal swab, presence of viral mutation(s) within the areas targeted by this assay, and inadequate number of viral copies (<250 copies / mL). A negative result must be combined with clinical observations, patient history, and epidemiological information.  Fact Sheet for Patients:   BoilerBrush.com.cy  Fact Sheet for Healthcare Providers: https://pope.com/  This test is not yet approved or  cleared by the [place] FDA and has been authorized for detection and/or diagnosis of SARS-CoV-2 by FDA under an Emergency Use Authorization (EUA).  This EUA will remain in effect (meaning this test can be used) for the duration of the COVID-19 declaration under Section 564(b)(1) of the Act, 21 U.S.C. section 360bbb-3(b)(1), unless the authorization is terminated or revoked sooner.  Performed at [facility], [address], [place], [place] [address]       Radiology Studies: CT ABDOMEN PELVIS WO CONTRAST  Result Date: 07/31/2019 CLINICAL DATA:  Upper abdominal pain with nausea for several days, history of pancreatitis EXAM: CT ABDOMEN AND PELVIS WITHOUT CONTRAST TECHNIQUE: Multidetector CT imaging of the abdomen and pelvis was performed following the standard protocol without IV contrast. COMPARISON:  06/14/2019 FINDINGS: Lower chest: No acute abnormality. Hepatobiliary: Fatty infiltration of the liver is noted. The gallbladder is decompressed. Pancreas: Pancreas demonstrates significant peripancreatic inflammatory change consistent with acute pancreatitis. The degree of inflammatory change has increased slightly in the interval from the prior exam. No large pancreatic pseudocyst is noted. Central pancreatic duct calculus is seen with dilatation of the pancreatic duct stable from prior examination. Spleen: Spleen is within normal limits. Adrenals/Urinary Tract: Adrenal glands demonstrate mild hyperplasia stable from the prior study. No renal calculi or obstructive changes are seen. The bladder is partially distended. Stomach/Bowel: Scattered diverticular change of the colon is noted. The appendix is within normal limits. Small bowel is unremarkable. Stomach is unremarkable as well. Vascular/Lymphatic: Aortic atherosclerosis. No enlarged abdominal or pelvic lymph nodes. Reproductive: Prostate is unremarkable. Other: No abdominal wall hernia or abnormality. No abdominopelvic ascites. Musculoskeletal: No acute or significant osseous findings. IMPRESSION: Acute pancreatitis is again identified although the degree of peripancreatic edema has increased when compared with the prior exam. 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 Fatty liver. Electronically Signed   By: Alcide Clever M.D.   On: 07/31/2019 18:02     Scheduled Meds: . aspirin EC  81 mg  Oral Daily  . atorvastatin  80 mg Oral QPM  . buPROPion  150 mg Oral BID  . carvedilol  20 mg Oral Daily  . folic acid  1 mg Oral Daily  . gabapentin  300 mg Oral BID  . insulin aspart  0-15 Units Subcutaneous Q6H  . insulin glargine  10 Units Subcutaneous QHS  . lipase/protease/amylase  36,000 Units Oral TID AC  . losartan  25 mg Oral Daily  . multivitamin with minerals  1 tablet Oral Daily  . nicotine  7 mg Transdermal Daily  . pantoprazole (PROTONIX) IV  40 mg Intravenous Q12H  . thiamine  100 mg Oral Daily   Or  . thiamine  100 mg Intravenous Daily  Continuous Infusions: . lactated ringers 125 mL/hr at 07/31/19 2143     LOS: 0 days    Time spent: 25 minutes spent in the coordination of care today.    Teddy Spike, DO [facility]  If 7PM-7AM, please contact night-coverage [email or web address] 08/01/2019, 4:15 PM

## 2019-08-01 NOTE — ED Notes (Signed)
MD bedside

## 2019-08-01 NOTE — ED Notes (Signed)
Sunquest printer did not work.

## 2019-08-02 ENCOUNTER — Inpatient Hospital Stay (HOSPITAL_COMMUNITY): Payer: No Typology Code available for payment source

## 2019-08-02 LAB — COMPREHENSIVE METABOLIC PANEL
ALT: 43 U/L (ref 0–44)
AST: 27 U/L (ref 15–41)
Albumin: 2.9 g/dL — ABNORMAL LOW (ref 3.5–5.0)
Alkaline Phosphatase: 65 U/L (ref 38–126)
Anion gap: 12 (ref 5–15)
BUN: 9 mg/dL (ref 6–20)
CO2: 22 mmol/L (ref 22–32)
Calcium: 8.7 mg/dL — ABNORMAL LOW (ref 8.9–10.3)
Chloride: 101 mmol/L (ref 98–111)
Creatinine, Ser: 0.51 mg/dL — ABNORMAL LOW (ref 0.61–1.24)
GFR calc Af Amer: >60 mL/min (ref >60)
GFR calc non Af Amer: >60 mL/min (ref >60)
Glucose, Bld: 85 mg/dL (ref 70–99)
Potassium: 3.8 mmol/L (ref 3.5–5.1)
Sodium: 135 mmol/L (ref 135–145)
Total Bilirubin: 0.9 mg/dL (ref 0.3–1.2)
Total Protein: 6.4 g/dL — ABNORMAL LOW (ref 6.5–8.1)

## 2019-08-02 LAB — CBC WITH DIFFERENTIAL/PLATELET
Abs Immature Granulocytes: 0 10*3/uL (ref 0.00–0.07)
Band Neutrophils: 12 %
Basophils Absolute: 0.1 10*3/uL (ref 0.0–0.1)
Basophils Relative: 1 %
Blasts: 0 %
Eosinophils Absolute: 0.3 10*3/uL (ref 0.0–0.5)
Eosinophils Relative: 4 %
HCT: 42.8 % (ref 39.0–52.0)
Hemoglobin: 14.7 g/dL (ref 13.0–17.0)
Lymphocytes Relative: 13 %
Lymphs Abs: 1 10*3/uL (ref 0.7–4.0)
MCH: 32.2 pg (ref 26.0–34.0)
MCHC: 34.3 g/dL (ref 30.0–36.0)
MCV: 93.7 fL (ref 80.0–100.0)
Metamyelocytes Relative: 0 %
Monocytes Absolute: 0.2 10*3/uL (ref 0.1–1.0)
Monocytes Relative: 3 %
Myelocytes: 0 %
Neutro Abs: 5.9 10*3/uL (ref 1.7–7.7)
Neutrophils Relative %: 67 %
Other: 0 %
Platelets: 118 10*3/uL — ABNORMAL LOW (ref 150–400)
Promyelocytes Relative: 0 %
RBC: 4.57 MIL/uL (ref 4.22–5.81)
RDW: 13.3 % (ref 11.5–15.5)
WBC: 7.5 10*3/uL (ref 4.0–10.5)
nRBC: 0 % (ref 0.0–0.2)
nRBC: 0 /100 WBC

## 2019-08-02 LAB — LIPASE, BLOOD: Lipase: 101 U/L — ABNORMAL HIGH (ref 11–51)

## 2019-08-02 LAB — GLUCOSE, CAPILLARY
Glucose-Capillary: 95 mg/dL (ref 70–99)
Glucose-Capillary: 91 mg/dL (ref 70–99)
Glucose-Capillary: 124 mg/dL — ABNORMAL HIGH (ref 70–99)
Glucose-Capillary: 158 mg/dL — ABNORMAL HIGH (ref 70–99)

## 2019-08-02 MED ORDER — LOSARTAN POTASSIUM 50 MG PO TABS
50.0000 mg | ORAL_TABLET | Freq: Every day | ORAL | Status: DC
  Administered 2019-08-03: 50 mg via ORAL
  Filled 2019-08-02: qty 1

## 2019-08-02 NOTE — Progress Notes (Signed)
[facility] Progress Note  CC:  ETOH pancreatitis, heme positive stool  Subjective: Abdominal pain controlled with pain medications. He wants to try clears  Objective:  Vital signs in last 24 hours: Temp:  [97.7 F (36.5 C)-98.1 F (36.7 C)] 98.1 F (36.7 C) (06/13 0549) Pulse Rate:  [70-90] 75 (06/13 0549) Resp:  [9-28] 18 (06/13 0549) BP: (135-164)/(92-105) 135/92 (06/13 0549) SpO2:  [93 %-100 %] 99 % (06/13 0549) Weight:  [82.5 kg] 82.5 kg (06/12 1635) Last BM Date: 07/31/19   General:   Alert,  Well-developed,    in NAD Heart:  Regular rate and rhythm; no murmurs Pulm: CTA Abdomen:  Soft, nontender and nondistended. Normal bowel sounds, without guarding, and without rebound.   Extremities:  Without edema. Neurologic:  Alert and  oriented x4;  grossly normal neurologically. Psych:  Alert and cooperative. Normal mood and affect.  Intake/Output from previous day: 06/12 0701 - 06/13 0700 In: 1341.7 [I.V.:1341.7] Out: -   Lab Results: Recent Labs    07/31/19 1651 08/01/19 0451  WBC 9.9 8.8  HGB 16.5 14.9  HCT 49.3 44.9  PLT 165 157   BMET Recent Labs    07/31/19 1651 08/01/19 0451 08/02/19 0815  NA 137 137 135  K 4.7 4.3 3.8  CL 101 103 101  CO2 22 21* 22  GLUCOSE 300* 171* 85  BUN 10 9 9   CREATININE 0.70 0.58* 0.51*  CALCIUM 9.3 9.0 8.7*   LFT Recent Labs    08/02/19 0815  PROT 6.4*  ALBUMIN 2.9*  AST 27  ALT 43  ALKPHOS 65  BILITOT 0.9   CT ABDOMEN PELVIS WO CONTRAST  Result Date: 07/31/2019 CLINICAL DATA:  Upper abdominal pain with nausea for several days, history of pancreatitis EXAM: CT ABDOMEN AND PELVIS WITHOUT CONTRAST TECHNIQUE: Multidetector CT imaging of the abdomen and pelvis was performed following the standard protocol without IV contrast. COMPARISON:  06/14/2019 FINDINGS: Lower chest: No acute abnormality. Hepatobiliary: Fatty infiltration of the liver is noted. The gallbladder is decompressed. Pancreas:  Pancreas demonstrates significant peripancreatic inflammatory change consistent with acute pancreatitis. The degree of inflammatory change has increased slightly in the interval from the prior exam. No large pancreatic pseudocyst is noted. Central pancreatic duct calculus is seen with dilatation of the pancreatic duct stable from prior examination. Spleen: Spleen is within normal limits. Adrenals/Urinary Tract: Adrenal glands demonstrate mild hyperplasia stable from the prior study. No renal calculi or obstructive changes are seen. The bladder is partially distended. Stomach/Bowel: Scattered diverticular change of the colon is noted. The appendix is within normal limits. Small bowel is unremarkable. Stomach is unremarkable as well. Vascular/Lymphatic: Aortic atherosclerosis. No enlarged abdominal or pelvic lymph nodes. Reproductive: Prostate is unremarkable. Other: No abdominal wall hernia or abnormality. No abdominopelvic ascites. Musculoskeletal: No acute or significant osseous findings. IMPRESSION: Acute pancreatitis is again identified although the degree of peripancreatic edema has increased when compared with the prior exam. 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 Fatty liver. Electronically Signed   By: 06/16/2019 M.D.   On: 07/31/2019 18:02    Assessment / Plan: *Acute pancreatitis:pancreatitis on CT, epigastric abdominal pain, and lipase elevated.  Likely due to ongoing ETOH use. -Pancreatic duct stone with dilationper MRCP 4/27. MRCP: 14mm calculus in pancreatic head (increased from 3.10mm in 2020). Pancreatitis without necrosis or pseudocyst. -daily ETOH use -Normal triglycerides -LFTs now within normal limits (also has fatty liver by imaging). -Renal function remains normal *ETOH abuse *  Hemoccult positive and reports of black stools:  CBC normal.  Reported black stool at last hospital admission, but was using Pepto-Bismol and was  hemoccult negative.  Had colonoscopy years ago.  -Supportive care with IVFs, antiemetics, pain control. Try clear liquids. -Further consideration of PD stone removal can be reviewed with our advanced endoscopists in the near future as this has been present since 2020 although PD dilation worsening. -Will need EGD and colonoscopy for evaluation of reported black stools/Hemoccult positive stools, timing to be determined.   LOS: 1 day   Laban Emperor. Zehr  08/02/2019, 10:08 AM      Attending Physician Note   I have taken an interval history, reviewed the chart and examined the patient. I agree with the Advanced Practitioner's note, impression and recommendations.   Pancreatitis improved, pain improved and lipase down to 101. Trial of clears. Discussed potential for PD stone removal in future. Discussed strictly avoiding all alcohol use.   Heme + stool, black stool, PeptoBismol use. Eventually will need colonoscopy and EGD. Timing TBD.   Fatty liver.   LFTs have normalized today. Slightly elevated transaminases the past 2 days. MRCP did not show GB pathology. Schedule RUQ [place] for tomorrow.   Lucio Edward, MD [facility]

## 2019-08-02 NOTE — Progress Notes (Signed)
PROGRESS NOTE    Ricardo Howard  XFG:[id number] DOB: Jul 15, 1960 DOA: 07/31/2019 PCP: System, Pcp Not In   Brief Narrative:   59 year old male with past medical history of diabetes mellitus type 2, alcohol abuse, hypertension, hyperlipidemia, nicotine dependence who presents to [facility] emergency department with complaints of abdominal pain.  6/12: Lipase worsening this AM. Ab pain worse on left. GI has eval'd. Continue supportive care and bowel rest. Possible EGD/colonoscopy as he improves. Continue CIWAA.  6/13: Labs looking better today and he is feeling better. Will start CLD today. Appreciate GI assistance. EGD/C-scope per their timing.   Assessment & Plan:   Principal Problem:   Acute pancreatitis Active Problems:   Alcohol abuse   Essential hypertension   Pancreatic duct stones   Mixed hyperlipidemia due to type 2 diabetes mellitus (HCC)   Uncontrolled type 2 diabetes mellitus with diabetic polyneuropathy, with long-term current use of insulin (HCC)   Nicotine dependence, cigarettes, uncomplicated   Complaint of melena  Acute pancreatitis Pancreatic duct stones Complaint of melena     - Patient is presenting with CT evidence of recurrent pancreatitis with persisting pancreatic stone     - Etiology is likely due to continued alcohol use, possibly with some contribution of the patient's retained pancreatic duct stone.  I have explained to the patient that despite him cutting back he must completely cease his use of alcohol.     - Patient has been made n.p.o. with exception of ice chips and medications.     - Hydrating patient with intravenous lactated Ringer solution     - As needed opiate-based analgesics for associated substantial pain     - 6/13: He is improving. Start CLD. Labs improved. EGD/c-scope at GI timing. Appreciate assistnace.   Alcohol abuse     - While patient reports cutting back on alcohol use this is still the most likely etiology of the  patient's recurrent pancreatitis exacerbated by the patient's retained stone.     - 6/12: continue CIWA, thiamine, folate, MVI     - reports 3 12-oz bottle beer daily, denies any history of withdrawal problems/DTs; counseled against further use of EtOH  Essential hypertension     - continue cozaar, coreg     - increase cozaar to 50mg   Uncontrolled type 2 diabetes mellitus with diabetic polyneuropathy, with long-term current use of insulin (HCC)     - Most recent hemoglobin A1c of 11% suggestive of extremely poor control.     - Odd regimen of monotherapy with short acting insulin in the outpatient setting     - While patient is n.p.o. we will place patient on Lantus 10 units nightly with Accu-Cheks every 6 hours with sliding scale insulin     - Once patient is able to tolerate oral intake patient would most likely benefit from transitioning to a true basal bolus insulin therapy such as a combination of Lantus and Humalog     - 6/12: glucose is acceptable, follow     - 6/12: glucose ok, starting CLD, follow  Mixed hyperlipidemia due to type 2 diabetes mellitus (HCC)     - continue atrovastatin  Tobacco abuse     - continue nicotine patch     - counseled against further tobacco usage  DVT prophylaxis: SCDs Code Status: FULL   Status is: Inpatient  Remains inpatient appropriate because:IV treatments appropriate due to intensity of illness or inability to take PO   Dispo: The patient is from: Home  Anticipated d/c is to: Home              Anticipated d/c date is: 3 days              Patient currently is not medically stable to d/c.  Consultants:   GI  ROS:  Denies N, V, CP, dyspnea . Remainder 10-pt ROS is negative for all not previously mentioned.  Subjective: "Do you think I can eat today?"  Objective: Vitals:   08/01/19 2145 08/02/19 0159 08/02/19 0549 08/02/19 1310  BP: (!) 164/105 (!) 137/102 (!) 135/92 (!) 163/99  Pulse: 76 79 75 76  Resp: 20  18 18    Temp: 97.8 F (36.6 C) 98.1 F (36.7 C) 98.1 F (36.7 C) 98.1 F (36.7 C)  TempSrc: Oral Oral Oral Oral  SpO2: 97% 95% 99% 100%  Weight:      Height:        Intake/Output Summary (Last 24 hours) at 08/02/2019 1414 Last data filed at 08/02/2019 1000 Gross per 24 hour  Intake 1341.67 ml  Output 225 ml  Net 1116.67 ml   Filed Weights   08/01/19 1635  Weight: 82.5 kg   Examination:  General: 59 y.o. male resting in bed in NAD Cardiovascular: RRR, +S1, S2, no m/g/r, equal pulses throughout Respiratory: CTABL, no w/r/r, normal WOB GI: BS+, ND, LUQ TTP, no masses noted MSK: No e/c/c Neuro: A&O x 3, no focal deficits Psyc: Appropriate interaction and affect, calm/cooperative  Data Reviewed: I have personally reviewed following labs and imaging studies.  CBC: Recent Labs  Lab 07/31/19 1651 08/01/19 0451 08/02/19 0815  WBC 9.9 8.8 7.5  NEUTROABS  --  6.4 5.9  HGB 16.5 14.9 14.7  HCT 49.3 44.9 42.8  MCV 95.7 95.1 93.7  PLT 165 157 008*   Basic Metabolic Panel: Recent Labs  Lab 07/31/19 1651 08/01/19 0451 08/02/19 0815  NA 137 137 135  K 4.7 4.3 3.8  CL 101 103 101  CO2 22 21* 22  GLUCOSE 300* 171* 85  BUN 10 9 9   CREATININE 0.70 0.58* 0.51*  CALCIUM 9.3 9.0 8.7*  MG  --  1.9  --    GFR: Estimated Creatinine Clearance: 102.8 mL/min (A) (by C-G formula based on SCr of 0.51 mg/dL (L)). Liver Function Tests: Recent Labs  Lab 07/31/19 1651 08/01/19 0451 08/02/19 0815  AST 60* 41 27  ALT 86* 64* 43  ALKPHOS 80 68 65  BILITOT 1.2 1.0 0.9  PROT 7.7 6.8 6.4*  ALBUMIN 3.9 3.6 2.9*   Recent Labs  Lab 07/31/19 1651 08/01/19 0451 08/02/19 0815  LIPASE 168* 683* 101*   No results for input(s): AMMONIA in the last 168 hours. Coagulation Profile: No results for input(s): INR, PROTIME in the last 168 hours. Cardiac Enzymes: No results for input(s): CKTOTAL, CKMB, CKMBINDEX, TROPONINI in the last 168 hours. BNP (last 3 results) No results for input(s):  PROBNP in the last 8760 hours. HbA1C: No results for input(s): HGBA1C in the last 72 hours. CBG: Recent Labs  Lab 08/01/19 0901 08/01/19 1651 08/01/19 2142 08/02/19 0305 08/02/19 0718  GLUCAP 152* 130* 96 95 91   Lipid Profile: Recent Labs    08/01/19 0451  CHOL 190  HDL 43  LDLCALC 124*  TRIG 113  CHOLHDL 4.4   Thyroid Function Tests: No results for input(s): TSH, T4TOTAL, FREET4, T3FREE, THYROIDAB in the last 72 hours. Anemia Panel: No results for input(s): VITAMINB12, FOLATE, FERRITIN, TIBC, IRON, RETICCTPCT in the last  72 hours. Sepsis Labs: No results for input(s): PROCALCITON, LATICACIDVEN in the last 168 hours.  Recent Results (from the past 240 hour(s))  SARS Coronavirus 2 by RT PCR (hospital order, performed in [facility] hospital lab) Nasopharyngeal Nasopharyngeal Swab     Status: None   Collection Time: 07/31/19  6:34 PM   Specimen: Nasopharyngeal Swab  Result Value Ref Range Status   SARS Coronavirus 2 NEGATIVE NEGATIVE Final    Comment: (NOTE) SARS-CoV-2 target nucleic acids are NOT DETECTED.  The SARS-CoV-2 RNA is generally detectable in upper and lower respiratory specimens during the acute phase of infection. The lowest concentration of SARS-CoV-2 viral copies this assay can detect is 250 copies / mL. A negative result does not preclude SARS-CoV-2 infection and should not be used as the sole basis for treatment or other patient management decisions.  A negative result may occur with improper specimen collection / handling, submission of specimen other than nasopharyngeal swab, presence of viral mutation(s) within the areas targeted by this assay, and inadequate number of viral copies (<250 copies / mL). A negative result must be combined with clinical observations, patient history, and epidemiological information.  Fact Sheet for Patients:   BoilerBrush.com.cy  Fact Sheet for Healthcare  Providers: https://pope.com/  This test is not yet approved or  cleared by the [place] FDA and has been authorized for detection and/or diagnosis of SARS-CoV-2 by FDA under an Emergency Use Authorization (EUA).  This EUA will remain in effect (meaning this test can be used) for the duration of the COVID-19 declaration under Section 564(b)(1) of the Act, 21 U.S.C. section 360bbb-3(b)(1), unless the authorization is terminated or revoked sooner.  Performed at [facility], [address] [place], [place] [address]       Radiology Studies: CT ABDOMEN PELVIS WO CONTRAST  Result Date: 07/31/2019 CLINICAL DATA:  Upper abdominal pain with nausea for several days, history of pancreatitis EXAM: CT ABDOMEN AND PELVIS WITHOUT CONTRAST TECHNIQUE: Multidetector CT imaging of the abdomen and pelvis was performed following the standard protocol without IV contrast. COMPARISON:  06/14/2019 FINDINGS: Lower chest: No acute abnormality. Hepatobiliary: Fatty infiltration of the liver is noted. The gallbladder is decompressed. Pancreas: Pancreas demonstrates significant peripancreatic inflammatory change consistent with acute pancreatitis. The degree of inflammatory change has increased slightly in the interval from the prior exam. No large pancreatic pseudocyst is noted. Central pancreatic duct calculus is seen with dilatation of the pancreatic duct stable from prior examination. Spleen: Spleen is within normal limits. Adrenals/Urinary Tract: Adrenal glands demonstrate mild hyperplasia stable from the prior study. No renal calculi or obstructive changes are seen. The bladder is partially distended. Stomach/Bowel: Scattered diverticular change of the colon is noted. The appendix is within normal limits. Small bowel is unremarkable. Stomach is unremarkable as well. Vascular/Lymphatic: Aortic atherosclerosis. No enlarged abdominal or pelvic lymph nodes. Reproductive:  Prostate is unremarkable. Other: No abdominal wall hernia or abnormality. No abdominopelvic ascites. Musculoskeletal: No acute or significant osseous findings. IMPRESSION: Acute pancreatitis is again identified although the degree of peripancreatic edema has increased when compared with the prior exam. 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 Fatty liver. Electronically Signed   By: Alcide Clever M.D.   On: 07/31/2019 18:02   US Abdomen Limited RUQ  Result Date: 08/02/2019 CLINICAL DATA:  Alcoholic pancreatitis EXAM: ULTRASOUND ABDOMEN LIMITED RIGHT UPPER QUADRANT COMPARISON:  CT from 2 days ago FINDINGS: Gallbladder: No over distension, stone, or wall thickening. No pericholecystic edema. Negative  sonographic Murphy sign. Common bile duct: Diameter: 5 mm.  Where visualized, no filling defect. Liver: Dense liver with poor acoustic penetration. No gross mass lesion. Portal vein is patent on color Doppler imaging with normal direction of blood flow towards the liver. IMPRESSION: 1. Negative gallbladder. 2. Hepatic steatosis. Electronically Signed   By: Marnee Spring M.D.   On: 08/02/2019 13:12     Scheduled Meds:  aspirin EC  81 mg Oral Daily   atorvastatin  80 mg Oral QPM   buPROPion  150 mg Oral BID   carvedilol  20 mg Oral Daily   folic acid  1 mg Oral Daily   gabapentin  300 mg Oral BID   insulin aspart  0-15 Units Subcutaneous Q6H   insulin glargine  10 Units Subcutaneous QHS   lipase/protease/amylase  36,000 Units Oral TID AC   losartan  25 mg Oral Daily   multivitamin with minerals  1 tablet Oral Daily   nicotine  7 mg Transdermal Daily   pantoprazole (PROTONIX) IV  40 mg Intravenous Q12H   thiamine  100 mg Oral Daily   Or   thiamine  100 mg Intravenous Daily   Continuous Infusions:   LOS: 1 day    Time spent: 25 minutes spent in the coordination of care today.    Teddy Spike, DO [facility]  If  7PM-7AM, please contact night-coverage [email or web address] 08/02/2019, 2:14 PM

## 2019-08-03 ENCOUNTER — Telehealth: Payer: Self-pay

## 2019-08-03 DIAGNOSIS — K852 Alcohol induced acute pancreatitis without necrosis or infection: Principal | ICD-10-CM

## 2019-08-03 DIAGNOSIS — K8689 Other specified diseases of pancreas: Secondary | ICD-10-CM

## 2019-08-03 DIAGNOSIS — R195 Other fecal abnormalities: Secondary | ICD-10-CM

## 2019-08-03 LAB — COMPREHENSIVE METABOLIC PANEL
ALT: 35 U/L (ref 0–44)
AST: 24 U/L (ref 15–41)
Albumin: 3 g/dL — ABNORMAL LOW (ref 3.5–5.0)
Alkaline Phosphatase: 65 U/L (ref 38–126)
Anion gap: 10 (ref 5–15)
BUN: 5 mg/dL — ABNORMAL LOW (ref 6–20)
CO2: 28 mmol/L (ref 22–32)
Calcium: 8.9 mg/dL (ref 8.9–10.3)
Chloride: 99 mmol/L (ref 98–111)
Creatinine, Ser: 0.47 mg/dL — ABNORMAL LOW (ref 0.61–1.24)
GFR calc Af Amer: >60 mL/min (ref >60)
GFR calc non Af Amer: >60 mL/min (ref >60)
Glucose, Bld: 92 mg/dL (ref 70–99)
Potassium: 3.4 mmol/L — ABNORMAL LOW (ref 3.5–5.1)
Sodium: 137 mmol/L (ref 135–145)
Total Bilirubin: 0.8 mg/dL (ref 0.3–1.2)
Total Protein: 6.3 g/dL — ABNORMAL LOW (ref 6.5–8.1)

## 2019-08-03 LAB — CBC WITH DIFFERENTIAL/PLATELET
Abs Immature Granulocytes: 0.02 10*3/uL (ref 0.00–0.07)
Basophils Absolute: 0 10*3/uL (ref 0.0–0.1)
Basophils Relative: 1 %
Eosinophils Absolute: 0.2 10*3/uL (ref 0.0–0.5)
Eosinophils Relative: 3 %
HCT: 45.5 % (ref 39.0–52.0)
Hemoglobin: 15.1 g/dL (ref 13.0–17.0)
Immature Granulocytes: 0 %
Lymphocytes Relative: 33 %
Lymphs Abs: 2.2 10*3/uL (ref 0.7–4.0)
MCH: 31.9 pg (ref 26.0–34.0)
MCHC: 33.2 g/dL (ref 30.0–36.0)
MCV: 96.2 fL (ref 80.0–100.0)
Monocytes Absolute: 0.7 10*3/uL (ref 0.1–1.0)
Monocytes Relative: 11 %
Neutro Abs: 3.4 10*3/uL (ref 1.7–7.7)
Neutrophils Relative %: 52 %
Platelets: 141 10*3/uL — ABNORMAL LOW (ref 150–400)
RBC: 4.73 MIL/uL (ref 4.22–5.81)
RDW: 13.2 % (ref 11.5–15.5)
WBC: 6.6 10*3/uL (ref 4.0–10.5)
nRBC: 0 % (ref 0.0–0.2)

## 2019-08-03 LAB — GLUCOSE, CAPILLARY
Glucose-Capillary: 87 mg/dL (ref 70–99)
Glucose-Capillary: 176 mg/dL — ABNORMAL HIGH (ref 70–99)
Glucose-Capillary: 311 mg/dL — ABNORMAL HIGH (ref 70–99)

## 2019-08-03 LAB — MAGNESIUM: Magnesium: 2 mg/dL (ref 1.7–2.4)

## 2019-08-03 MED ORDER — LOSARTAN POTASSIUM 50 MG PO TABS: 50.0000 mg | ORAL_TABLET | Freq: Every day | ORAL | 0 refills | Status: DC

## 2019-08-03 MED ORDER — INSULIN ASPART 100 UNIT/ML ~~LOC~~ SOLN
0.0000 [IU] | Freq: Three times a day (TID) | SUBCUTANEOUS | Status: DC
  Administered 2019-08-03: 7 [IU] via SUBCUTANEOUS

## 2019-08-03 MED ORDER — INSULIN ASPART 100 UNIT/ML ~~LOC~~ SOLN: 0.0000 [IU] | Freq: Every day | SUBCUTANEOUS | Status: DC

## 2019-08-03 MED ORDER — OXYCODONE-ACETAMINOPHEN 7.5-325 MG PO TABS
1.0000 | ORAL_TABLET | Freq: Four times a day (QID) | ORAL | Status: DC | PRN
  Administered 2019-08-03: 1 via ORAL
  Filled 2019-08-03: qty 1

## 2019-08-03 MED ORDER — POTASSIUM CHLORIDE CRYS ER 20 MEQ PO TBCR
20.0000 meq | EXTENDED_RELEASE_TABLET | Freq: Two times a day (BID) | ORAL | Status: DC
  Administered 2019-08-03: 20 meq via ORAL
  Filled 2019-08-03: qty 1

## 2019-08-03 MED ORDER — PANTOPRAZOLE SODIUM 40 MG PO TBEC: 40.0000 mg | DELAYED_RELEASE_TABLET | Freq: Every day | ORAL | 0 refills | Status: DC

## 2019-08-03 MED ORDER — PANTOPRAZOLE SODIUM 40 MG PO TBEC: 40.0000 mg | DELAYED_RELEASE_TABLET | Freq: Two times a day (BID) | ORAL | Status: DC

## 2019-08-03 MED ORDER — OXYCODONE-ACETAMINOPHEN 7.5-325 MG PO TABS: 1.0000 | ORAL_TABLET | Freq: Four times a day (QID) | ORAL | 0 refills | Status: AC | PRN

## 2019-08-03 MED ORDER — POTASSIUM CHLORIDE CRYS ER 20 MEQ PO TBCR: 20.0000 meq | EXTENDED_RELEASE_TABLET | Freq: Two times a day (BID) | ORAL | 0 refills | Status: DC

## 2019-08-03 NOTE — Progress Notes (Signed)
Progress Note   Subjective  Chief Complaint: Alcoholic pancreatitis, heme positive stool  Today, patient is found standing up and walking around his room and appears comfortable.  Tells me he had no problem with the clear liquids yesterday no nausea or abdominal pain, he passed a bowel movement this morning with no obvious blood.  Does ask when he can go home.   Objective   Vital signs in last 24 hours: Temp:  [97.7 F (36.5 C)-98.1 F (36.7 C)] 97.7 F (36.5 C) (06/14 0417) Pulse Rate:  [70-77] 72 (06/14 0800) Resp:  [18] 18 (06/14 0417) BP: (155-165)/(92-104) 155/92 (06/14 0800) SpO2:  [97 %-100 %] 97 % (06/14 0417) Last BM Date: 08/01/19 General:   AA male in NAD Heart:  Regular rate and rhythm; no murmurs Lungs: Respirations even and unlabored, lungs CTA bilaterally Abdomen:  Soft, nontender and nondistended. Normal bowel sounds. Extremities:  Without edema. Psych:  Cooperative. Normal mood and affect.  Intake/Output from previous day: 06/13 0701 - 06/14 0700 In: 360 [P.O.:360] Out: 225 [Urine:225] Intake/Output this shift: Total I/O In: 360 [P.O.:360] Out: -   Lab Results: Recent Labs    08/01/19 0451 08/02/19 0815 08/03/19 0402  WBC 8.8 7.5 6.6  HGB 14.9 14.7 15.1  HCT 44.9 42.8 45.5  PLT 157 118* 141*   BMET Recent Labs    08/01/19 0451 08/02/19 0815 08/03/19 0402  NA 137 135 137  K 4.3 3.8 3.4*  CL 103 101 99  CO2 21* 22 28  GLUCOSE 171* 85 92  BUN 9 9 5*  CREATININE 0.58* 0.51* 0.47*  CALCIUM 9.0 8.7* 8.9   LFT Recent Labs    08/03/19 0402  PROT 6.3*  ALBUMIN 3.0*  AST 24  ALT 35  ALKPHOS 65  BILITOT 0.8    Studies/Results: US Abdomen Limited RUQ  Result Date: 08/02/2019 CLINICAL DATA:  Alcoholic pancreatitis EXAM: ULTRASOUND ABDOMEN LIMITED RIGHT UPPER QUADRANT COMPARISON:  CT from 2 days ago FINDINGS: Gallbladder: No over distension, stone, or wall thickening. No pericholecystic edema. Negative sonographic Murphy sign. Common  bile duct: Diameter: 5 mm.  Where visualized, no filling defect. Liver: Dense liver with poor acoustic penetration. No gross mass lesion. Portal vein is patent on color Doppler imaging with normal direction of blood flow towards the liver. IMPRESSION: 1. Negative gallbladder. 2. Hepatic steatosis. Electronically Signed   By: Marnee Spring M.D.   On: 08/02/2019 13:12    Assessment / Plan:   Assessment: 1.  Acute alcoholic pancreatitis: Pancreatitis on CT, epigastric pain initially which has since abated, lipase leveling, ultrasound negative for gallbladder 2.  Pancreatic duct stone with dilation: Per MRCP 4/27, dilation minimally increased from 2020 3.  Alcohol abuse 4.  Hemoccult positive stool: CBC normal, reported black stool at time of last admission, but was using Pepto-Bismol, Hemoccult negative at that time  Plan: 1.  PD stone and removal was discussed with Dr. Meridee Score or advanced endoscopist, he recommends that the patient has to completely abstain from alcohol before we can consider removal. 2.  Patient would benefit from an EGD and colonoscopy given heme positive stools.  This will likely take place as an outpatient as he has had no further bleeding and his hemoglobin has been stable here. 3.  We will advance diet to full liquids today. 4.  Continue other supportive measures 5.  Please await further recommendations from Dr. Barron Alvine later today  Thank you for kind consultation, we will continue to follow.  LOS: 2 days   Levin Erp  08/03/2019, 9:53 AM

## 2019-08-03 NOTE — Progress Notes (Signed)
Pt to be discharged to home this afternoon. Discharge instructions along with Medications and schedules for these Medication reviewed with the Pt and Pt's Wife. Understanding verbalized Discharge packet with pt at time of discharge

## 2019-08-03 NOTE — Telephone Encounter (Signed)
09/01/19 at 2 pm appt with Doug Sou.

## 2019-08-03 NOTE — Discharge Summary (Signed)
Physician Discharge Summary  Ricardo Howard HYQ:[id number] DOB: 1960-06-18 DOA: 07/31/2019  PCP: System, Pcp Not In  Admit date: 07/31/2019 Discharge date: 08/03/2019  Admitted From: Home Disposition:  Discharged to home.   Recommendations for Outpatient Follow-up:  1. Follow up with PCP in 1 week.  2. Follow up with GI outpt for possible ERCP/EGD/C-scope  Discharge Condition: Stable  CODE STATUS: FULL   Brief/Interim Summary: 59 year old male with past medical history of diabetes mellitus type 2, alcohol abuse, hypertension, hyperlipidemia, nicotine dependence who presents to [facility] emergency department with complaints of abdominal pain.  6/13: Labs looking better today and he is feeling better. Will start CLD today. Appreciate GI assistance. EGD/C-scope per their timing.  6/14: Tolerating FLD. Hgb is stable. No further episodes of dark stool. He needs to follow up with GI outpt. Follow up with PCP in 1 week. Stable for discharge.    Discharge Diagnoses:  Principal Problem:   Acute pancreatitis Active Problems:   Alcohol abuse   Essential hypertension   Pancreatic duct calculus   Mixed hyperlipidemia due to type 2 diabetes mellitus (HCC)   Uncontrolled type 2 diabetes mellitus with diabetic polyneuropathy, with long-term current use of insulin (HCC)   Nicotine dependence, cigarettes, uncomplicated   Complaint of melena   Occult blood in stools  Acute pancreatitis Pancreatic duct stones Complaint of melena - Patient is presenting with CT evidence of recurrent pancreatitis with persisting pancreatic stone - Etiology is likely due to continued alcohol use, possibly with some contribution of the patient's retained pancreatic duct stone. I have explained to the patient that despite him cutting back he must completely cease his use of alcohol. - Patient has been made n.p.o. with exception of ice chips and medications. - Hydrating patient with  intravenous lactated Ringer solution - As needed opiate-based analgesics for associated substantial pain - 6/14. Tolerating FLD. Denies ab pain, N, V. Needs to follow up with GI outpt for EGD/C-scope. No further complains of melana. Ok for discharge to home.   Alcohol abuse - While patient reports cutting back on alcohol use this is still the most likely etiology of the patient's recurrent pancreatitis exacerbated by the patient's retained stone. - 6/12: continue CIWA, thiamine, folate, MVI - reports 3 12-oz bottle beer daily, denies any history of withdrawal problems/DTs; counseled against further use of EtOH     - 6/14: Needs to abstain from alcohol to increase chances of ERCP w/ GI  Essential hypertension - continue cozaar, coreg - increase cozaar to 50mg   Uncontrolled type 2 diabetes mellitus with diabetic polyneuropathy, with long-term current use of insulin (HCC) - Most recent hemoglobin A1c of 11% suggestive of extremely poor control. - Odd regimen of monotherapy with short acting insulin in the outpatient setting - While patient is n.p.o. we will place patient on Lantus 10 units nightly with Accu-Cheks every 6 hours with sliding scale insulin - Once patient is able to tolerate oral intake patient would most likely benefit from transitioning to a true basal bolus insulin therapy such as a combination of Lantus and Humalog - 6/14: resume home regimen at discharge.  Mixed hyperlipidemia due to type 2 diabetes mellitus (HCC) - continue atrovastatin  Tobacco abuse - continue nicotine patch - counseled against further tobacco usage  Discharge Instructions   Allergies as of 08/03/2019      Reactions   Iohexol Hives   Patient broke out in hives after injection of Omni 300, will need 13 hour pre-med in future  Lisinopril Cough   Simvastatin Itching, Nausea Only      Medication List    TAKE these medications    aspirin 81 MG tablet Take 81 mg by mouth daily.   atorvastatin 80 MG tablet Commonly known as: LIPITOR Take 80 mg by mouth every evening.   buPROPion 150 MG 12 hr tablet Commonly known as: WELLBUTRIN SR Take 150 mg by mouth in the morning and at bedtime.   carvedilol 20 MG 24 hr capsule Commonly known as: COREG CR Take 20 mg by mouth daily.   gabapentin 300 MG capsule Commonly known as: NEURONTIN Take 300 mg by mouth in the morning and at bedtime.   insulin aspart 100 UNIT/ML injection Commonly known as: novoLOG Inject 64 Units into the skin in the morning.   lipase/protease/amylase 91505 UNITS Cpep capsule Commonly known as: CREON Take 1 capsule (36,000 Units total) by mouth 3 (three) times daily before meals.   losartan 50 MG tablet Commonly known as: COZAAR Take 1 tablet (50 mg total) by mouth daily. Start taking on: August 04, 2019 What changed: how much to take   oxyCODONE-acetaminophen 7.5-325 MG tablet Commonly known as: PERCOCET Take 1 tablet by mouth every 6 (six) hours as needed for up to 3 days for moderate pain.   pantoprazole 40 MG tablet Commonly known as: PROTONIX Take 1 tablet (40 mg total) by mouth daily.   potassium chloride SA 20 MEQ tablet Commonly known as: KLOR-CON Take 1 tablet (20 mEq total) by mouth 2 (two) times daily for 3 days.   Quintabs Tabs Take 1 tablet by mouth daily.   thiamine 100 MG tablet Take 100 mg by mouth daily.   zolpidem 10 MG tablet Commonly known as: AMBIEN Take 10 mg by mouth at bedtime as needed for sleep.       Allergies  Allergen Reactions   Iohexol Hives    Patient broke out in hives after injection of Omni 300, will need 13 hour pre-med in future   Lisinopril Cough   Simvastatin Itching and Nausea Only    Consultations:  GI  Procedures/Studies: CT ABDOMEN PELVIS WO CONTRAST  Result Date: 07/31/2019 CLINICAL DATA:  Upper abdominal pain with nausea for several days, history of pancreatitis  EXAM: CT ABDOMEN AND PELVIS WITHOUT CONTRAST TECHNIQUE: Multidetector CT imaging of the abdomen and pelvis was performed following the standard protocol without IV contrast. COMPARISON:  06/14/2019 FINDINGS: Lower chest: No acute abnormality. Hepatobiliary: Fatty infiltration of the liver is noted. The gallbladder is decompressed. Pancreas: Pancreas demonstrates significant peripancreatic inflammatory change consistent with acute pancreatitis. The degree of inflammatory change has increased slightly in the interval from the prior exam. No large pancreatic pseudocyst is noted. Central pancreatic duct calculus is seen with dilatation of the pancreatic duct stable from prior examination. Spleen: Spleen is within normal limits. Adrenals/Urinary Tract: Adrenal glands demonstrate mild hyperplasia stable from the prior study. No renal calculi or obstructive changes are seen. The bladder is partially distended. Stomach/Bowel: Scattered diverticular change of the colon is noted. The appendix is within normal limits. Small bowel is unremarkable. Stomach is unremarkable as well. Vascular/Lymphatic: Aortic atherosclerosis. No enlarged abdominal or pelvic lymph nodes. Reproductive: Prostate is unremarkable. Other: No abdominal wall hernia or abnormality. No abdominopelvic ascites. Musculoskeletal: No acute or significant osseous findings. IMPRESSION: Acute pancreatitis is again identified although the degree of peripancreatic edema has increased when compared with the prior exam. 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 Fatty liver. Electronically Signed  By: Alcide Clever M.D.   On: 07/31/2019 18:02   US Abdomen Limited RUQ  Result Date: 08/02/2019 CLINICAL DATA:  Alcoholic pancreatitis EXAM: ULTRASOUND ABDOMEN LIMITED RIGHT UPPER QUADRANT COMPARISON:  CT from 2 days ago FINDINGS: Gallbladder: No over distension, stone, or wall thickening. No pericholecystic edema.  Negative sonographic Murphy sign. Common bile duct: Diameter: 5 mm.  Where visualized, no filling defect. Liver: Dense liver with poor acoustic penetration. No gross mass lesion. Portal vein is patent on color Doppler imaging with normal direction of blood flow towards the liver. IMPRESSION: 1. Negative gallbladder. 2. Hepatic steatosis. Electronically Signed   By: Marnee Spring M.D.   On: 08/02/2019 13:12      Subjective: "I feel good."  Discharge Exam: Vitals:   08/03/19 0800 08/03/19 1210  BP: (!) 155/92 (!) 158/102  Pulse: 72 72  Resp:  (!) 23  Temp:  97.7 F (36.5 C)  SpO2:  98%   Vitals:   08/02/19 2214 08/03/19 0417 08/03/19 0800 08/03/19 1210  BP: (!) 157/98 (!) 165/104 (!) 155/92 (!) 158/102  Pulse: 77 70 72 72  Resp:  18  (!) 23  Temp: 97.7 F (36.5 C) 97.7 F (36.5 C)  97.7 F (36.5 C)  TempSrc: Oral Oral    SpO2: 98% 97%  98%  Weight:      Height:        General: 59 y.o. male resting in bed in NAD Cardiovascular: RRR, +S1, S2, no m/g/r Respiratory: CTABL, no w/r/r, normal WOB GI: BS+, NDNT, soft MSK: No e/c/c Neuro: A&O x 3, no focal deficits Psyc: Appropriate interaction and affect, calm/cooperative  The results of significant diagnostics from this hospitalization (including imaging, microbiology, ancillary and laboratory) are listed below for reference.     Microbiology: Recent Results (from the past 240 hour(s))  SARS Coronavirus 2 by RT PCR (hospital order, performed in [facility] hospital lab) Nasopharyngeal Nasopharyngeal Swab     Status: None   Collection Time: 07/31/19  6:34 PM   Specimen: Nasopharyngeal Swab  Result Value Ref Range Status   SARS Coronavirus 2 NEGATIVE NEGATIVE Final    Comment: (NOTE) SARS-CoV-2 target nucleic acids are NOT DETECTED.  The SARS-CoV-2 RNA is generally detectable in upper and lower respiratory specimens during the acute phase of infection. The lowest concentration of SARS-CoV-2 viral copies this assay can  detect is 250 copies / mL. A negative result does not preclude SARS-CoV-2 infection and should not be used as the sole basis for treatment or other patient management decisions.  A negative result may occur with improper specimen collection / handling, submission of specimen other than nasopharyngeal swab, presence of viral mutation(s) within the areas targeted by this assay, and inadequate number of viral copies (<250 copies / mL). A negative result must be combined with clinical observations, patient history, and epidemiological information.  Fact Sheet for Patients:   BoilerBrush.com.cy  Fact Sheet for Healthcare Providers: https://pope.com/  This test is not yet approved or  cleared by the [place] FDA and has been authorized for detection and/or diagnosis of SARS-CoV-2 by FDA under an Emergency Use Authorization (EUA).  This EUA will remain in effect (meaning this test can be used) for the duration of the COVID-19 declaration under Section 564(b)(1) of the Act, 21 U.S.C. section 360bbb-3(b)(1), unless the authorization is terminated or revoked sooner.  Performed at [facility], [address], [place], [place] [address]      Labs: BNP (last 3 results)  No results for input(s): BNP in the last 8760 hours. Basic Metabolic Panel: Recent Labs  Lab 07/31/19 1651 08/01/19 0451 08/02/19 0815 08/03/19 0402  NA 137 137 135 137  K 4.7 4.3 3.8 3.4*  CL 101 103 101 99  CO2 22 21* 22 28  GLUCOSE 300* 171* 85 92  BUN 10 9 9  5*  CREATININE 0.70 0.58* 0.51* 0.47*  CALCIUM 9.3 9.0 8.7* 8.9  MG  --  1.9  --  2.0   Liver Function Tests: Recent Labs  Lab 07/31/19 1651 08/01/19 0451 08/02/19 0815 08/03/19 0402  AST 60* 41 27 24  ALT 86* 64* 43 35  ALKPHOS 80 68 65 65  BILITOT 1.2 1.0 0.9 0.8  PROT 7.7 6.8 6.4* 6.3*  ALBUMIN 3.9 3.6 2.9* 3.0*   Recent Labs  Lab 07/31/19 1651 08/01/19 0451  08/02/19 0815  LIPASE 168* 683* 101*   No results for input(s): AMMONIA in the last 168 hours. CBC: Recent Labs  Lab 07/31/19 1651 08/01/19 0451 08/02/19 0815 08/03/19 0402  WBC 9.9 8.8 7.5 6.6  NEUTROABS  --  6.4 5.9 3.4  HGB 16.5 14.9 14.7 15.1  HCT 49.3 44.9 42.8 45.5  MCV 95.7 95.1 93.7 96.2  PLT 165 157 118* 141*   Cardiac Enzymes: No results for input(s): CKTOTAL, CKMB, CKMBINDEX, TROPONINI in the last 168 hours. BNP: Invalid input(s): POCBNP CBG: Recent Labs  Lab 08/02/19 1716 08/02/19 2211 08/03/19 0415 08/03/19 0843 08/03/19 1130  GLUCAP 124* 158* 87 176* 311*   D-Dimer No results for input(s): DDIMER in the last 72 hours. Hgb A1c No results for input(s): HGBA1C in the last 72 hours. Lipid Profile Recent Labs    08/01/19 0451  CHOL 190  HDL 43  LDLCALC 124*  TRIG 113  CHOLHDL 4.4   Thyroid function studies No results for input(s): TSH, T4TOTAL, T3FREE, THYROIDAB in the last 72 hours.  Invalid input(s): FREET3 Anemia work up No results for input(s): VITAMINB12, FOLATE, FERRITIN, TIBC, IRON, RETICCTPCT in the last 72 hours. Urinalysis    Component Value Date/Time   COLORURINE YELLOW 06/14/2019 0910   APPEARANCEUR CLEAR 06/14/2019 0910   LABSPEC >1.046 (H) 06/14/2019 0910   PHURINE 5.0 06/14/2019 0910   GLUCOSEU >=500 (A) 06/14/2019 0910   HGBUR SMALL (A) 06/14/2019 0910   BILIRUBINUR NEGATIVE 06/14/2019 0910   KETONESUR 80 (A) 06/14/2019 0910   PROTEINUR NEGATIVE 06/14/2019 0910   NITRITE NEGATIVE 06/14/2019 0910   LEUKOCYTESUR NEGATIVE 06/14/2019 0910   Sepsis Labs Invalid input(s): PROCALCITONIN,  WBC,  LACTICIDVEN Microbiology Recent Results (from the past 240 hour(s))  SARS Coronavirus 2 by RT PCR (hospital order, [facility]) Nasopharyngeal Nasopharyngeal Swab     Status: None   Collection Time: 07/31/19  6:34 PM   Specimen: Nasopharyngeal Swab  Result Value Ref Range Status   SARS Coronavirus 2 NEGATIVE  NEGATIVE Final    Comment: (NOTE) SARS-CoV-2 target nucleic acids are NOT DETECTED.  The SARS-CoV-2 RNA is generally detectable in upper and lower respiratory specimens during the acute phase of infection. The lowest concentration of SARS-CoV-2 viral copies this assay can detect is 250 copies / mL. A negative result does not preclude SARS-CoV-2 infection and should not be used as the sole basis for treatment or other patient management decisions.  A negative result may occur with improper specimen collection / handling, submission of specimen other than nasopharyngeal swab, presence of viral mutation(s) within the areas targeted by this assay, and inadequate  number of viral copies (<250 copies / mL). A negative result must be combined with clinical observations, patient history, and epidemiological information.  Fact Sheet for Patients:   StrictlyIdeas.no  Fact Sheet for Healthcare Providers: BankingDealers.co.za  This test is not yet approved or  cleared by the [place] FDA and has been authorized for detection and/or diagnosis of SARS-CoV-2 by FDA under an Emergency Use Authorization (EUA).  This EUA will remain in effect (meaning this test can be used) for the duration of the COVID-19 declaration under Section 564(b)(1) of the Act, 21 U.S.C. section 360bbb-3(b)(1), unless the authorization is terminated or revoked sooner.  Performed at [facility] [place] [address], [place], [place] [address]      Time coordinating discharge: 35 minutes  SIGNED:   Jonnie Finner, DO  [facility] 08/03/2019, 2:48 PM   If 7PM-7AM, please contact night-coverage [email or web address]

## 2019-08-03 NOTE — Progress Notes (Signed)

## 2019-08-03 NOTE — Telephone Encounter (Signed)
-----   Message from [facility] [place], [place] sent at 08/03/2019  2:08 PM EDT ----- Regarding: Needs OV jess Patient needs follow up with Jess in 3-4 weeks for pancreatitis and heme + stool- will need set up for outpatient ECL.  Thanks-JLL

## 2019-09-01 ENCOUNTER — Ambulatory Visit: Payer: Self-pay | Admitting: Gastroenterology

## 2019-10-06 ENCOUNTER — Encounter (HOSPITAL_COMMUNITY): Payer: Self-pay | Admitting: Emergency Medicine

## 2019-10-06 ENCOUNTER — Other Ambulatory Visit: Payer: Self-pay

## 2019-10-06 DIAGNOSIS — F1010 Alcohol abuse, uncomplicated: Secondary | ICD-10-CM | POA: Diagnosis present

## 2019-10-06 DIAGNOSIS — Z79899 Other long term (current) drug therapy: Secondary | ICD-10-CM

## 2019-10-06 DIAGNOSIS — I10 Essential (primary) hypertension: Secondary | ICD-10-CM | POA: Diagnosis present

## 2019-10-06 DIAGNOSIS — E1169 Type 2 diabetes mellitus with other specified complication: Secondary | ICD-10-CM | POA: Diagnosis present

## 2019-10-06 DIAGNOSIS — E860 Dehydration: Secondary | ICD-10-CM | POA: Diagnosis present

## 2019-10-06 DIAGNOSIS — E1165 Type 2 diabetes mellitus with hyperglycemia: Secondary | ICD-10-CM | POA: Diagnosis present

## 2019-10-06 DIAGNOSIS — Z955 Presence of coronary angioplasty implant and graft: Secondary | ICD-10-CM

## 2019-10-06 DIAGNOSIS — R109 Unspecified abdominal pain: Secondary | ICD-10-CM | POA: Diagnosis not present

## 2019-10-06 DIAGNOSIS — Z7982 Long term (current) use of aspirin: Secondary | ICD-10-CM

## 2019-10-06 DIAGNOSIS — F17210 Nicotine dependence, cigarettes, uncomplicated: Secondary | ICD-10-CM | POA: Diagnosis present

## 2019-10-06 DIAGNOSIS — Z20822 Contact with and (suspected) exposure to covid-19: Secondary | ICD-10-CM | POA: Diagnosis present

## 2019-10-06 DIAGNOSIS — Z794 Long term (current) use of insulin: Secondary | ICD-10-CM

## 2019-10-06 DIAGNOSIS — K852 Alcohol induced acute pancreatitis without necrosis or infection: Principal | ICD-10-CM | POA: Diagnosis present

## 2019-10-06 DIAGNOSIS — K860 Alcohol-induced chronic pancreatitis: Secondary | ICD-10-CM | POA: Diagnosis present

## 2019-10-06 DIAGNOSIS — Z888 Allergy status to other drugs, medicaments and biological substances status: Secondary | ICD-10-CM

## 2019-10-06 DIAGNOSIS — Z9114 Patient's other noncompliance with medication regimen: Secondary | ICD-10-CM

## 2019-10-06 DIAGNOSIS — Z8719 Personal history of other diseases of the digestive system: Secondary | ICD-10-CM

## 2019-10-06 DIAGNOSIS — K5900 Constipation, unspecified: Secondary | ICD-10-CM | POA: Diagnosis present

## 2019-10-06 DIAGNOSIS — E782 Mixed hyperlipidemia: Secondary | ICD-10-CM | POA: Diagnosis present

## 2019-10-06 LAB — CBC
HCT: 46.8 % (ref 39.0–52.0)
Hemoglobin: 15.6 g/dL (ref 13.0–17.0)
MCH: 31.8 pg (ref 26.0–34.0)
MCHC: 33.3 g/dL (ref 30.0–36.0)
MCV: 95.5 fL (ref 80.0–100.0)
Platelets: 205 10*3/uL (ref 150–400)
RBC: 4.9 MIL/uL (ref 4.22–5.81)
RDW: 14.5 % (ref 11.5–15.5)
WBC: 10.1 10*3/uL (ref 4.0–10.5)
nRBC: 0 % (ref 0.0–0.2)

## 2019-10-06 LAB — COMPREHENSIVE METABOLIC PANEL
ALT: 43 U/L (ref 0–44)
AST: 49 U/L — ABNORMAL HIGH (ref 15–41)
Albumin: 4.3 g/dL (ref 3.5–5.0)
Alkaline Phosphatase: 80 U/L (ref 38–126)
Anion gap: 15 (ref 5–15)
BUN: 11 mg/dL (ref 6–20)
CO2: 24 mmol/L (ref 22–32)
Calcium: 9.3 mg/dL (ref 8.9–10.3)
Chloride: 94 mmol/L — ABNORMAL LOW (ref 98–111)
Creatinine, Ser: 0.7 mg/dL (ref 0.61–1.24)
GFR calc Af Amer: >60 mL/min (ref >60)
GFR calc non Af Amer: >60 mL/min (ref >60)
Glucose, Bld: 342 mg/dL — ABNORMAL HIGH (ref 70–99)
Potassium: 5 mmol/L (ref 3.5–5.1)
Sodium: 133 mmol/L — ABNORMAL LOW (ref 135–145)
Total Bilirubin: 1.1 mg/dL (ref 0.3–1.2)
Total Protein: 8 g/dL (ref 6.5–8.1)

## 2019-10-06 LAB — LIPASE, BLOOD: Lipase: 640 U/L — ABNORMAL HIGH (ref 11–51)

## 2019-10-06 NOTE — ED Triage Notes (Signed)
abd pains for couple days with no BM in 3-4 days. Denies urinary problems. Drinks ETOH everyday  But had none since yesterday.

## 2019-10-07 ENCOUNTER — Inpatient Hospital Stay (HOSPITAL_COMMUNITY)
Admission: EM | Admit: 2019-10-07 | Discharge: 2019-10-10 | DRG: 440 | Disposition: A | Discharge status: Home / Self Care | Payer: No Typology Code available for payment source | Attending: Internal Medicine | Admitting: Internal Medicine

## 2019-10-07 ENCOUNTER — Emergency Department (HOSPITAL_COMMUNITY): Payer: No Typology Code available for payment source

## 2019-10-07 DIAGNOSIS — R7989 Other specified abnormal findings of blood chemistry: Secondary | ICD-10-CM | POA: Diagnosis not present

## 2019-10-07 DIAGNOSIS — K5900 Constipation, unspecified: Secondary | ICD-10-CM | POA: Diagnosis present

## 2019-10-07 DIAGNOSIS — E1169 Type 2 diabetes mellitus with other specified complication: Secondary | ICD-10-CM | POA: Diagnosis present

## 2019-10-07 DIAGNOSIS — I10 Essential (primary) hypertension: Secondary | ICD-10-CM

## 2019-10-07 DIAGNOSIS — Z888 Allergy status to other drugs, medicaments and biological substances status: Secondary | ICD-10-CM | POA: Diagnosis not present

## 2019-10-07 DIAGNOSIS — F1010 Alcohol abuse, uncomplicated: Secondary | ICD-10-CM | POA: Diagnosis present

## 2019-10-07 DIAGNOSIS — E782 Mixed hyperlipidemia: Secondary | ICD-10-CM | POA: Diagnosis present

## 2019-10-07 DIAGNOSIS — F17210 Nicotine dependence, cigarettes, uncomplicated: Secondary | ICD-10-CM | POA: Diagnosis present

## 2019-10-07 DIAGNOSIS — Z9114 Patient's other noncompliance with medication regimen: Secondary | ICD-10-CM | POA: Diagnosis not present

## 2019-10-07 DIAGNOSIS — E1142 Type 2 diabetes mellitus with diabetic polyneuropathy: Secondary | ICD-10-CM

## 2019-10-07 DIAGNOSIS — Z955 Presence of coronary angioplasty implant and graft: Secondary | ICD-10-CM | POA: Diagnosis not present

## 2019-10-07 DIAGNOSIS — E860 Dehydration: Secondary | ICD-10-CM | POA: Diagnosis present

## 2019-10-07 DIAGNOSIS — E1165 Type 2 diabetes mellitus with hyperglycemia: Secondary | ICD-10-CM | POA: Diagnosis present

## 2019-10-07 DIAGNOSIS — Z794 Long term (current) use of insulin: Secondary | ICD-10-CM | POA: Diagnosis not present

## 2019-10-07 DIAGNOSIS — K852 Alcohol induced acute pancreatitis without necrosis or infection: Secondary | ICD-10-CM

## 2019-10-07 DIAGNOSIS — E1140 Type 2 diabetes mellitus with diabetic neuropathy, unspecified: Secondary | ICD-10-CM

## 2019-10-07 DIAGNOSIS — Z20822 Contact with and (suspected) exposure to covid-19: Secondary | ICD-10-CM | POA: Diagnosis present

## 2019-10-07 DIAGNOSIS — K860 Alcohol-induced chronic pancreatitis: Secondary | ICD-10-CM | POA: Diagnosis present

## 2019-10-07 DIAGNOSIS — K859 Acute pancreatitis without necrosis or infection, unspecified: Secondary | ICD-10-CM | POA: Diagnosis present

## 2019-10-07 DIAGNOSIS — R739 Hyperglycemia, unspecified: Secondary | ICD-10-CM

## 2019-10-07 DIAGNOSIS — IMO0002 Reserved for concepts with insufficient information to code with codable children: Secondary | ICD-10-CM

## 2019-10-07 DIAGNOSIS — Z79899 Other long term (current) drug therapy: Secondary | ICD-10-CM | POA: Diagnosis not present

## 2019-10-07 DIAGNOSIS — Z7982 Long term (current) use of aspirin: Secondary | ICD-10-CM | POA: Diagnosis not present

## 2019-10-07 DIAGNOSIS — Z8719 Personal history of other diseases of the digestive system: Secondary | ICD-10-CM | POA: Diagnosis not present

## 2019-10-07 DIAGNOSIS — R109 Unspecified abdominal pain: Secondary | ICD-10-CM | POA: Diagnosis present

## 2019-10-07 LAB — URINALYSIS, ROUTINE W REFLEX MICROSCOPIC
Bacteria, UA: NONE SEEN
Bilirubin Urine: NEGATIVE
Glucose, UA: >=500 mg/dL — AB
Ketones, ur: 80 mg/dL — AB
Leukocytes,Ua: NEGATIVE
Nitrite: NEGATIVE
Protein, ur: 30 mg/dL — AB
Specific Gravity, Urine: 1.029 (ref 1.005–1.030)
pH: 5 (ref 5.0–8.0)

## 2019-10-07 LAB — CBC
HCT: 44.5 % (ref 39.0–52.0)
Hemoglobin: 14.9 g/dL (ref 13.0–17.0)
MCH: 32.4 pg (ref 26.0–34.0)
MCHC: 33.5 g/dL (ref 30.0–36.0)
MCV: 96.7 fL (ref 80.0–100.0)
Platelets: 173 10*3/uL (ref 150–400)
RBC: 4.6 MIL/uL (ref 4.22–5.81)
RDW: 14.4 % (ref 11.5–15.5)
WBC: 10.3 10*3/uL (ref 4.0–10.5)
nRBC: 0 % (ref 0.0–0.2)

## 2019-10-07 LAB — COMPREHENSIVE METABOLIC PANEL
ALT: 33 U/L (ref 0–44)
AST: 34 U/L (ref 15–41)
Albumin: 3.7 g/dL (ref 3.5–5.0)
Alkaline Phosphatase: 68 U/L (ref 38–126)
Anion gap: 14 (ref 5–15)
BUN: 16 mg/dL (ref 6–20)
CO2: 19 mmol/L — ABNORMAL LOW (ref 22–32)
Calcium: 8.3 mg/dL — ABNORMAL LOW (ref 8.9–10.3)
Chloride: 100 mmol/L (ref 98–111)
Creatinine, Ser: 0.66 mg/dL (ref 0.61–1.24)
GFR calc Af Amer: >60 mL/min (ref >60)
GFR calc non Af Amer: >60 mL/min (ref >60)
Glucose, Bld: 204 mg/dL — ABNORMAL HIGH (ref 70–99)
Potassium: 4.1 mmol/L (ref 3.5–5.1)
Sodium: 133 mmol/L — ABNORMAL LOW (ref 135–145)
Total Bilirubin: 1.2 mg/dL (ref 0.3–1.2)
Total Protein: 7.3 g/dL (ref 6.5–8.1)

## 2019-10-07 LAB — RAPID URINE DRUG SCREEN, HOSP PERFORMED
Amphetamines: NOT DETECTED
Barbiturates: NOT DETECTED
Benzodiazepines: NOT DETECTED
Cocaine: POSITIVE — AB
Opiates: POSITIVE — AB
Tetrahydrocannabinol: NOT DETECTED

## 2019-10-07 LAB — MAGNESIUM: Magnesium: 2.2 mg/dL (ref 1.7–2.4)

## 2019-10-07 LAB — PROTIME-INR
INR: 1.1 (ref 0.8–1.2)
Prothrombin Time: 13.9 seconds (ref 11.4–15.2)

## 2019-10-07 LAB — CBG MONITORING, ED
Glucose-Capillary: 280 mg/dL — ABNORMAL HIGH (ref 70–99)
Glucose-Capillary: 223 mg/dL — ABNORMAL HIGH (ref 70–99)

## 2019-10-07 LAB — PHOSPHORUS: Phosphorus: 3.1 mg/dL (ref 2.5–4.6)

## 2019-10-07 LAB — LACTIC ACID, PLASMA: Lactic Acid, Venous: 1.7 mmol/L (ref 0.5–1.9)

## 2019-10-07 LAB — SARS CORONAVIRUS 2 BY RT PCR (HOSPITAL ORDER, PERFORMED IN ~~LOC~~ HOSPITAL LAB): SARS Coronavirus 2: NEGATIVE

## 2019-10-07 LAB — GLUCOSE, CAPILLARY
Glucose-Capillary: 200 mg/dL — ABNORMAL HIGH (ref 70–99)
Glucose-Capillary: 177 mg/dL — ABNORMAL HIGH (ref 70–99)

## 2019-10-07 LAB — ETHANOL: Alcohol, Ethyl (B): <10 mg/dL (ref <10)

## 2019-10-07 LAB — CK: Total CK: 628 U/L — ABNORMAL HIGH (ref 49–397)

## 2019-10-07 LAB — BETA-HYDROXYBUTYRIC ACID: Beta-Hydroxybutyric Acid: 3.12 mmol/L — ABNORMAL HIGH (ref 0.05–0.27)

## 2019-10-07 MED ORDER — PROMETHAZINE HCL 25 MG/ML IJ SOLN
12.5000 mg | Freq: Once | INTRAMUSCULAR | Status: AC
  Administered 2019-10-07: 12.5 mg via INTRAVENOUS
  Filled 2019-10-07: qty 1

## 2019-10-07 MED ORDER — SODIUM CHLORIDE 0.9% FLUSH
3.0000 mL | Freq: Two times a day (BID) | INTRAVENOUS | Status: DC
  Administered 2019-10-09 – 2019-10-10 (×4): 3 mL via INTRAVENOUS

## 2019-10-07 MED ORDER — LORAZEPAM 1 MG PO TABS: 0.0000 mg | ORAL_TABLET | Freq: Four times a day (QID) | ORAL | Status: AC

## 2019-10-07 MED ORDER — LORAZEPAM 2 MG/ML IJ SOLN: 1.0000 mg | INTRAMUSCULAR | Status: DC | PRN

## 2019-10-07 MED ORDER — THIAMINE HCL 100 MG/ML IJ SOLN
100.0000 mg | Freq: Every day | INTRAMUSCULAR | Status: DC
  Administered 2019-10-07: 100 mg via INTRAVENOUS
  Filled 2019-10-07: qty 2

## 2019-10-07 MED ORDER — FOLIC ACID 1 MG PO TABS
1.0000 mg | ORAL_TABLET | Freq: Every day | ORAL | Status: DC
  Administered 2019-10-08 – 2019-10-10 (×3): 1 mg via ORAL
  Filled 2019-10-07 (×3): qty 1

## 2019-10-07 MED ORDER — HYDROCODONE-ACETAMINOPHEN 5-325 MG PO TABS
1.0000 | ORAL_TABLET | ORAL | Status: DC | PRN
  Administered 2019-10-08 – 2019-10-09 (×4): 1 via ORAL
  Filled 2019-10-07 (×4): qty 1

## 2019-10-07 MED ORDER — THIAMINE HCL 100 MG/ML IJ SOLN: 100.0000 mg | Freq: Every day | INTRAMUSCULAR | Status: DC

## 2019-10-07 MED ORDER — INSULIN ASPART 100 UNIT/ML ~~LOC~~ SOLN
10.0000 [IU] | Freq: Once | SUBCUTANEOUS | Status: AC
  Administered 2019-10-07: 5 [IU] via SUBCUTANEOUS
  Filled 2019-10-07: qty 0.1

## 2019-10-07 MED ORDER — INSULIN ASPART 100 UNIT/ML ~~LOC~~ SOLN
0.0000 [IU] | SUBCUTANEOUS | Status: DC
  Administered 2019-10-07 – 2019-10-08 (×3): 2 [IU] via SUBCUTANEOUS
  Administered 2019-10-08 (×2): 1 [IU] via SUBCUTANEOUS
  Administered 2019-10-09: 2 [IU] via SUBCUTANEOUS
  Administered 2019-10-09: 3 [IU] via SUBCUTANEOUS
  Administered 2019-10-10 (×2): 5 [IU] via SUBCUTANEOUS
  Filled 2019-10-07: qty 0.09

## 2019-10-07 MED ORDER — ACETAMINOPHEN 650 MG RE SUPP: 650.0000 mg | Freq: Four times a day (QID) | RECTAL | Status: DC | PRN

## 2019-10-07 MED ORDER — HYDROMORPHONE HCL 1 MG/ML IJ SOLN
1.0000 mg | Freq: Once | INTRAMUSCULAR | Status: AC
  Administered 2019-10-07: 1 mg via INTRAVENOUS
  Filled 2019-10-07: qty 1

## 2019-10-07 MED ORDER — PANCRELIPASE (LIP-PROT-AMYL) 12000-38000 UNITS PO CPEP: 36000.0000 [IU] | ORAL_CAPSULE | Freq: Three times a day (TID) | ORAL | Status: DC

## 2019-10-07 MED ORDER — DOCUSATE SODIUM 100 MG PO CAPS
100.0000 mg | ORAL_CAPSULE | Freq: Two times a day (BID) | ORAL | Status: DC
  Administered 2019-10-07 – 2019-10-10 (×5): 100 mg via ORAL
  Filled 2019-10-07 (×5): qty 1

## 2019-10-07 MED ORDER — LORAZEPAM 2 MG/ML IJ SOLN: 0.0000 mg | Freq: Two times a day (BID) | INTRAMUSCULAR | Status: DC

## 2019-10-07 MED ORDER — LORAZEPAM 2 MG/ML IJ SOLN: 0.0000 mg | Freq: Four times a day (QID) | INTRAMUSCULAR | Status: AC

## 2019-10-07 MED ORDER — SODIUM CHLORIDE 0.9 % IV BOLUS
1000.0000 mL | Freq: Once | INTRAVENOUS | Status: AC
  Administered 2019-10-07: 1000 mL via INTRAVENOUS

## 2019-10-07 MED ORDER — LABETALOL HCL 5 MG/ML IV SOLN
5.0000 mg | Freq: Once | INTRAVENOUS | Status: AC
  Administered 2019-10-07: 5 mg via INTRAVENOUS
  Filled 2019-10-07: qty 4

## 2019-10-07 MED ORDER — THIAMINE HCL 100 MG PO TABS: 100.0000 mg | ORAL_TABLET | Freq: Every day | ORAL | Status: DC

## 2019-10-07 MED ORDER — HYDROMORPHONE HCL 1 MG/ML IJ SOLN
0.5000 mg | INTRAMUSCULAR | Status: DC | PRN
  Administered 2019-10-07 – 2019-10-09 (×5): 1 mg via INTRAVENOUS
  Filled 2019-10-07 (×5): qty 1

## 2019-10-07 MED ORDER — ONDANSETRON HCL 4 MG/2ML IJ SOLN
4.0000 mg | Freq: Once | INTRAMUSCULAR | Status: AC
  Administered 2019-10-07: 4 mg via INTRAVENOUS
  Filled 2019-10-07: qty 2

## 2019-10-07 MED ORDER — INSULIN GLARGINE 100 UNIT/ML ~~LOC~~ SOLN
10.0000 [IU] | Freq: Every day | SUBCUTANEOUS | Status: DC
  Administered 2019-10-07 – 2019-10-09 (×2): 10 [IU] via SUBCUTANEOUS
  Filled 2019-10-07 (×5): qty 0.1

## 2019-10-07 MED ORDER — MORPHINE SULFATE (PF) 4 MG/ML IV SOLN
4.0000 mg | Freq: Once | INTRAVENOUS | Status: AC
  Administered 2019-10-07: 4 mg via INTRAVENOUS
  Filled 2019-10-07: qty 1

## 2019-10-07 MED ORDER — ATORVASTATIN CALCIUM 40 MG PO TABS
80.0000 mg | ORAL_TABLET | Freq: Every evening | ORAL | Status: DC
  Administered 2019-10-08 – 2019-10-10 (×3): 80 mg via ORAL
  Filled 2019-10-07 (×3): qty 2

## 2019-10-07 MED ORDER — SODIUM CHLORIDE 0.9 % IV SOLN: INTRAVENOUS | Status: DC

## 2019-10-07 MED ORDER — LORAZEPAM 1 MG PO TABS: 0.0000 mg | ORAL_TABLET | Freq: Two times a day (BID) | ORAL | Status: DC

## 2019-10-07 MED ORDER — THIAMINE HCL 100 MG PO TABS
100.0000 mg | ORAL_TABLET | Freq: Every day | ORAL | Status: DC
  Administered 2019-10-08 – 2019-10-10 (×3): 100 mg via ORAL
  Filled 2019-10-07 (×3): qty 1

## 2019-10-07 MED ORDER — PANTOPRAZOLE SODIUM 40 MG IV SOLR
40.0000 mg | Freq: Once | INTRAVENOUS | Status: AC
  Administered 2019-10-07: 40 mg via INTRAVENOUS
  Filled 2019-10-07: qty 40

## 2019-10-07 MED ORDER — NICOTINE 21 MG/24HR TD PT24
21.0000 mg | MEDICATED_PATCH | Freq: Every day | TRANSDERMAL | Status: DC
  Administered 2019-10-08 – 2019-10-10 (×3): 21 mg via TRANSDERMAL
  Filled 2019-10-07 (×3): qty 1

## 2019-10-07 MED ORDER — ONDANSETRON HCL 4 MG/2ML IJ SOLN: 4.0000 mg | Freq: Four times a day (QID) | INTRAMUSCULAR | Status: DC | PRN

## 2019-10-07 MED ORDER — ONDANSETRON HCL 4 MG PO TABS
4.0000 mg | ORAL_TABLET | Freq: Four times a day (QID) | ORAL | Status: DC | PRN
  Administered 2019-10-09: 4 mg via ORAL
  Filled 2019-10-07: qty 1

## 2019-10-07 MED ORDER — GABAPENTIN 300 MG PO CAPS
300.0000 mg | ORAL_CAPSULE | Freq: Two times a day (BID) | ORAL | Status: DC
  Administered 2019-10-07 – 2019-10-10 (×6): 300 mg via ORAL
  Filled 2019-10-07 (×6): qty 1

## 2019-10-07 MED ORDER — ADULT MULTIVITAMIN W/MINERALS CH
1.0000 | ORAL_TABLET | Freq: Every day | ORAL | Status: DC
  Administered 2019-10-08 – 2019-10-10 (×3): 1 via ORAL
  Filled 2019-10-07 (×3): qty 1

## 2019-10-07 MED ORDER — ACETAMINOPHEN 325 MG PO TABS
650.0000 mg | ORAL_TABLET | Freq: Four times a day (QID) | ORAL | Status: DC | PRN
  Administered 2019-10-09: 650 mg via ORAL
  Filled 2019-10-07: qty 2

## 2019-10-07 MED ORDER — LORAZEPAM 1 MG PO TABS: 1.0000 mg | ORAL_TABLET | ORAL | Status: DC | PRN

## 2019-10-07 MED ORDER — CARVEDILOL PHOSPHATE ER 20 MG PO CP24
20.0000 mg | ORAL_CAPSULE | Freq: Every day | ORAL | Status: DC
  Administered 2019-10-08 – 2019-10-10 (×3): 20 mg via ORAL
  Filled 2019-10-07 (×3): qty 1

## 2019-10-07 NOTE — H&P (Signed)
Ricardo Howard [id number]:[id number] DOB: 03/25/1960 DOA: 10/07/2019     PCP: [facility]   Outpatient Specialists:   NONE    Patient arrived to ER on 10/06/19 at 1722 Referred by Attending Alvira Monday, MD   Patient coming from: home Lives  With family    Chief Complaint:  Chief Complaint  Patient presents with  . Abdominal Pain  . Constipation    HPI: Ricardo Howard is a 59 y.o. male with medical history significant of Dm2, EtOh abuse, HTN, HLD, tobacco abuse, chronic pnacreatitis     Presented with   Abdominal pain nausea vomiting similar to prior episodes of pancreatitis Last admission for pancreatitis was in June 2021.  At that time CT scan showed pancreatic stone.  Patient has episodes of recurrent pancreatitis most likely combination of EtOH use as well as retained pancreatic duct stone. Patient was discharged on 14 June and was instructed to stop drinking he continues to drink at this time.  Drinks about 6 beers a day.  Denies any history of withdrawals DTs or seizures.  Infectious risk factors:  Reports  N/V/Diarrhea/abdominal pain,     Has  been vaccinated against COVID    Initial COVID TEST  NEGATIVE   Lab Results  Component Value Date   SARSCOV2NAA NEGATIVE 10/07/2019   SARSCOV2NAA NEGATIVE 07/31/2019   SARSCOV2NAA NEGATIVE 06/14/2019     Regarding pertinent Chronic problems:     Hyperlipidemia -  on statins Lipitor Lipid Panel     Component Value Date/Time   CHOL 190 08/01/2019 0451   TRIG 113 08/01/2019 0451   HDL 43 08/01/2019 0451   CHOLHDL 4.4 08/01/2019 0451   VLDL 23 08/01/2019 0451   LDLCALC 124 (H) 08/01/2019 0451     HTN on Cozaar during last admission was increased to 50 mg, Coreg     DM 2 -  Lab Results  Component Value Date   HGBA1C 11.1 (H) 06/15/2019   on insulin poorly controlled    While in ER: CT showed evidence of recurrent pancreatitis lipase elevated 640 Initially hyperglycemic blood sugars in  the 300s patient has not been compliant his home medications.  Treatment initiated in the emergency department with improvement. Initial CIWA protocol  0 alcohol level 0 Hyperglycemia was treated with subcu insulin no evidence of DKA Patient was given Dilaudid for pain but still continues to be in significant pain at which point   Hospitalist was called for admission for recurrent pancreatitis secondary to alcohol abuse  The following Work up has been ordered so far:  Orders Placed This Encounter  Procedures  . SARS Coronavirus 2 by RT PCR (hospital order, performed in [facility] hospital lab) Nasopharyngeal Nasopharyngeal Swab  . CT ABDOMEN PELVIS WO CONTRAST  . Lipase, blood  . Comprehensive metabolic panel  . CBC  . Urinalysis, Routine w reflex microscopic  . Ethanol  . Rapid urine drug screen (hospital performed)  . Protime-INR  . Diet NPO time specified  . Clinical institute withdrawal assessment every 6 hours X 48 hours, then every 12 hours x 48 hours  . Notify MD if CIWA-AR is greater than 10 for 2 consecutive assessments.  . May administer Ativan PO versus IV if patient is tolerating PO intake well  . Vital signs every 6 hours X 48 hours, then per unit protocol  . Consult to hospitalist  ALL PATIENTS BEING ADMITTED/HAVING PROCEDURES NEED COVID-19 SCREENING  . POC CBG, ED  . POC CBG, ED  .  ED EKG     Following Medications were ordered in ER: Medications  LORazepam (ATIVAN) injection 0-4 mg (0 mg Intravenous Not Given 10/07/19 1554)    Or  LORazepam (ATIVAN) tablet 0-4 mg ( Oral See Alternative 10/07/19 1554)  LORazepam (ATIVAN) injection 0-4 mg (has no administration in time range)    Or  LORazepam (ATIVAN) tablet 0-4 mg (has no administration in time range)  thiamine tablet 100 mg ( Oral See Alternative 10/07/19 1553)    Or  thiamine (B-1) injection 100 mg (100 mg Intravenous Given 10/07/19 1553)  sodium chloride 0.9 % bolus 1,000 mL (0 mLs Intravenous Stopped 10/07/19  1720)  morphine 4 MG/ML injection 4 mg (4 mg Intravenous Given 10/07/19 1553)  ondansetron (ZOFRAN) injection 4 mg (4 mg Intravenous Given 10/07/19 1553)  insulin aspart (novoLOG) injection 10 Units (5 Units Subcutaneous Given 10/07/19 1654)  pantoprazole (PROTONIX) injection 40 mg (40 mg Intravenous Given 10/07/19 1700)  promethazine (PHENERGAN) injection 12.5 mg (12.5 mg Intravenous Given 10/07/19 1701)  HYDROmorphone (DILAUDID) injection 1 mg (1 mg Intravenous Given 10/07/19 1701)  labetalol (NORMODYNE) injection 5 mg (5 mg Intravenous Given 10/07/19 1832)        Consult Orders  (From admission, onward)         Start     Ordered   10/07/19 1810  Consult to hospitalist  ALL PATIENTS BEING ADMITTED/HAVING PROCEDURES NEED COVID-19 SCREENING  Once       Comments: ALL PATIENTS BEING ADMITTED/HAVING PROCEDURES NEED COVID-19 SCREENING  Provider:  (Not yet assigned)  Question Answer Comment  Place call to: [facility]   Reason for Consult Admit      10/07/19 1809          Significant initial  Findings: Abnormal Labs Reviewed  LIPASE, BLOOD - Abnormal; Notable for the following components:      Result Value   Lipase 640 (*)    All other components within normal limits  COMPREHENSIVE METABOLIC PANEL - Abnormal; Notable for the following components:   Sodium 133 (*)    Chloride 94 (*)    Glucose, Bld 342 (*)    AST 49 (*)    All other components within normal limits  URINALYSIS, ROUTINE W REFLEX MICROSCOPIC - Abnormal; Notable for the following components:   Glucose, UA >=500 (*)    Hgb urine dipstick SMALL (*)    Ketones, ur 80 (*)    Protein, ur 30 (*)    All other components within normal limits  CBG MONITORING, ED - Abnormal; Notable for the following components:   Glucose-Capillary 280 (*)    All other components within normal limits   Otherwise labs showing:    Recent Labs  Lab 10/06/19 1924  NA 133*  K 5.0  CO2 24  GLUCOSE 342*  BUN 11  CREATININE 0.70   CALCIUM 9.3    Cr    Up from baseline see below Lab Results  Component Value Date   CREATININE 0.70 10/06/2019   CREATININE 0.47 (L) 08/03/2019   CREATININE 0.51 (L) 08/02/2019    Recent Labs  Lab 10/06/19 1924  AST 49*  ALT 43  ALKPHOS 80  BILITOT 1.1  PROT 8.0  ALBUMIN 4.3   Lab Results  Component Value Date   CALCIUM 9.3 10/06/2019      WBC      Component Value Date/Time   WBC 10.1 10/06/2019 1924   ANC    Component Value Date/Time   NEUTROABS 3.4 08/03/2019 0402  ALC No components found for: LYMPHAB    Plt: Lab Results  Component Value Date   PLT 205 10/06/2019   Lactic Acid, Venous No results found for: LATICACIDVEN @ (RESUTFAST[lacticacid:4)@     COVID-19 Labs  No results for input(s): DDIMER, FERRITIN, LDH, CRP in the last 72 hours.  Lab Results  Component Value Date   SARSCOV2NAA NEGATIVE 10/07/2019   SARSCOV2NAA NEGATIVE 07/31/2019   SARSCOV2NAA NEGATIVE 06/14/2019    HG/HCT  Stable,     Component Value Date/Time   HGB 15.6 10/06/2019 1924   HCT 46.8 10/06/2019 1924    Recent Labs  Lab 10/06/19 1924  LIPASE 640*   No results for input(s): AMMONIA in the last 168 hours.    ECG: Ordered Personally reviewed by me showing: HR : 96 Rhythm:  NSR,    no evidence of ischemic changes [id number]     DM  labs:  HbA1C: Recent Labs    06/15/19 0412  HGBA1C 11.1*       CBG (last 3)  Recent Labs    10/07/19 1641  GLUCAP 280*       UA    no evidence of UTI      Urine analysis:    Component Value Date/Time   COLORURINE YELLOW 10/07/2019 1735   APPEARANCEUR CLEAR 10/07/2019 1735   LABSPEC 1.029 10/07/2019 1735   PHURINE 5.0 10/07/2019 1735   GLUCOSEU >=500 (A) 10/07/2019 1735   HGBUR SMALL (A) 10/07/2019 1735   BILIRUBINUR NEGATIVE 10/07/2019 1735   KETONESUR 80 (A) 10/07/2019 1735   PROTEINUR 30 (A) 10/07/2019 1735   NITRITE NEGATIVE 10/07/2019 1735   LEUKOCYTESUR NEGATIVE 10/07/2019 1735       Ordered    CTabd/pelvis - acute on chronic pancratitis   ED Triage Vitals  Enc Vitals Group     BP 10/06/19 1753 (!) 169/99     Pulse Rate 10/06/19 1753 (!) 102     Resp 10/06/19 1753 16     Temp 10/06/19 1753 99.3 F (37.4 C)     Temp Source 10/06/19 1753 Oral     SpO2 10/06/19 1753 98 %     Weight --      Height --      Head Circumference --      Peak Flow --      Pain Score 10/06/19 1801 8     Pain Loc --      Pain Edu? --      Excl. in GC? --   TMAX(24)@       Latest  Blood pressure (!) 172/110, pulse 99, temperature 98.1 F (36.7 C), temperature source Oral, resp. rate 16, SpO2 97 %.      Review of Systems:    Pertinent positives include:    Fatigue  abdominal pain, nausea, vomiting,  Constitutional:  No weight loss, night sweats, Fevers, chills,, weight loss  HEENT:  No headaches, Difficulty swallowing,Tooth/dental problems,Sore throat,  No sneezing, itching, ear ache, nasal congestion, post nasal drip,  Cardio-vascular:  No chest pain, Orthopnea, PND, anasarca, dizziness, palpitations.no Bilateral lower extremity swelling  GI:  No heartburn, indigestion, diarrhea, change in bowel habits, loss of appetite, melena, blood in stool, hematemesis Resp:  no shortness of breath at rest. No dyspnea on exertion, No excess mucus, no productive cough, No non-productive cough, No coughing up of blood.No change in color of mucus.No wheezing. Skin:  no rash or lesions. No jaundice GU:  no dysuria, change in color of urine, no urgency  or frequency. No straining to urinate.  No flank pain.  Musculoskeletal:  No joint pain or no joint swelling. No decreased range of motion. No back pain.  Psych:  No change in mood or affect. No depression or anxiety. No memory loss.  Neuro: no localizing neurological complaints, no tingling, no weakness, no double vision, no gait abnormality, no slurred speech, no confusion  All systems reviewed and apart from HOPI all are negative  Past Medical  History:   Past Medical History:  Diagnosis Date  . Alcohol abuse 06/14/2019  . Diabetes mellitus without complication (HCC)   . Essential hypertension 06/14/2019  . Mixed hyperlipidemia due to type 2 diabetes mellitus (HCC) 07/31/2019  . Nicotine dependence, cigarettes, uncomplicated 07/31/2019  . Pancreatitis   . Uncontrolled type 2 diabetes mellitus with diabetic polyneuropathy, with long-term current use of insulin (HCC) 07/31/2019      Past Surgical History:  Procedure Laterality Date  . CORONARY ANGIOPLASTY WITH STENT PLACEMENT      Social History:  Ambulatory   independently      reports that he has been smoking cigarettes. He has been smoking about 0.25 packs per day. He has never used smokeless tobacco. He reports current alcohol use. He reports that he does not use drugs.     Family History:   Family History  Problem Relation Age of Onset  . Other Neg Hx     Allergies: Allergies  Allergen Reactions  . Iohexol Hives    Patient broke out in hives after injection of Omni 300, will need 13 hour pre-med in future  . Lisinopril Cough  . Simvastatin Itching and Nausea Only     Prior to Admission medications   Medication Sig Start Date End Date Taking? Authorizing Provider  aspirin 81 MG tablet Take 81 mg by mouth daily.   Yes [provider]  atorvastatin (LIPITOR) 80 MG tablet Take 80 mg by mouth every evening.   Yes [provider]  buPROPion (WELLBUTRIN SR) 150 MG 12 hr tablet Take 150 mg by mouth in the morning and at bedtime.   Yes [provider]  carvedilol (COREG CR) 20 MG 24 hr capsule Take 20 mg by mouth daily.   Yes [provider]  gabapentin (NEURONTIN) 300 MG capsule Take 300 mg by mouth in the morning and at bedtime.   Yes [provider]  insulin aspart (NOVOLOG) 100 UNIT/ML injection Inject 64 Units into the skin in the morning.   Yes [provider]  lipase/protease/amylase (CREON) 36000 UNITS  CPEP capsule Take 1 capsule (36,000 Units total) by mouth 3 (three) times daily before meals. 06/17/19  Yes Roberto Scales D, MD  losartan (COZAAR) 50 MG tablet Take 1 tablet (50 mg total) by mouth daily. 08/04/19 10/07/19 Yes Kyle, Tyrone A, DO  Multiple Vitamin (QUINTABS) TABS Take 1 tablet by mouth daily. 11/22/18  Yes [provider]  zolpidem (AMBIEN) 10 MG tablet Take 10 mg by mouth at bedtime.    Yes [provider]  pantoprazole (PROTONIX) 40 MG tablet Take 1 tablet (40 mg total) by mouth daily. 08/03/19 09/02/19  Margie Ege A, DO  potassium chloride SA (KLOR-CON) 20 MEQ tablet Take 1 tablet (20 mEq total) by mouth 2 (two) times daily for 3 days. 08/03/19 08/06/19  Teddy Spike, DO   Physical Exam: Vitals with BMI 10/07/2019 10/07/2019 10/07/2019  Height - - -  Weight - - -  BMI - - -  Systolic 172 167  177  Diastolic 110 112 098  Pulse 99 100 103     1. General:  in No Acute distress    Chronically ill -appearing 2. Psychological: Alert and  Oriented 3. Head/ENT:    Dry Mucous Membranes                          Head Non traumatic, neck supple                           Poor Dentition 4. SKIN:  decreased Skin turgor,  Skin clean Dry and intact no rash 5. Heart: Regular rate and rhythm no  Murmur, no Rub or gallop 6. Lungs:   no wheezes or crackles   7. Abdomen: Soft,   epigastric tenderness, Non distended   obese  bowel sounds present 8. Lower extremities: no clubbing, cyanosis, no edema 9. Neurologically Grossly intact, moving all 4 extremities equally   10. MSK: Normal range of motion  All other LABS:     Recent Labs  Lab 10/06/19 1924  WBC 10.1  HGB 15.6  HCT 46.8  MCV 95.5  PLT 205     Recent Labs  Lab 10/06/19 1924  NA 133*  K 5.0  CL 94*  CO2 24  GLUCOSE 342*  BUN 11  CREATININE 0.70  CALCIUM 9.3     Recent Labs  Lab 10/06/19 1924  AST 49*  ALT 43  ALKPHOS 80  BILITOT 1.1  PROT 8.0  ALBUMIN 4.3       Cultures: No results  found for: SDES, SPECREQUEST, CULT, REPTSTATUS   Radiological Exams on Admission: CT ABDOMEN PELVIS WO CONTRAST  Result Date: 10/07/2019 CLINICAL DATA:  Diffuse upper abdominal pain for 5 days. History of pancreatitis. EXAM: CT ABDOMEN AND PELVIS WITHOUT CONTRAST TECHNIQUE: Multidetector CT imaging of the abdomen and pelvis was performed following the standard protocol without IV contrast. COMPARISON:  07/31/2019 CT abdomen/pelvis. FINDINGS: Lower chest: No significant pulmonary nodules or acute consolidative airspace disease. Coronary atherosclerosis. Hepatobiliary: Diffuse hepatic steatosis. Normal gallbladder with no radiopaque cholelithiasis. No biliary ductal dilatation. CBD diameter 4 mm. Pancreas: There is mild diffuse haziness of the peripancreatic fat compatible with acute pancreatitis. Chronic coarse calcifications in the pancreatic head are unchanged and indicate underlying chronic pancreatitis. No discrete pancreatic mass. No pancreatic duct dilation. Spleen: Normal size. No mass. Adrenals/Urinary Tract: Stable adrenal glands with mild lobular thickening of both adrenal glands, without discrete adrenal nodules. No hydronephrosis. No renal stones. No contour deforming renal masses. Normal bladder. Stomach/Bowel: Normal non-distended stomach. Normal caliber small bowel with no small bowel wall thickening. Normal appendix. Normal large bowel with no diverticulosis, large bowel wall thickening or pericolonic fat stranding. Vascular/Lymphatic: Atherosclerotic nonaneurysmal abdominal aorta. No pathologically enlarged lymph nodes in the abdomen or pelvis. Reproductive: Normal size prostate. Other: No pneumoperitoneum, ascites or focal fluid collection. Musculoskeletal: No aggressive appearing focal osseous lesions. Mild thoracolumbar spondylosis. IMPRESSION: 1. Noncontrast CT findings are compatible with acute on chronic pancreatitis. Cannot assess for necrotizing pancreatitis given the absence of IV  contrast. No biliary ductal dilatation. No evidence of pancreatic pseudocysts. 2. Diffuse hepatic steatosis. 3. Coronary atherosclerosis. 4. Aortic Atherosclerosis (ICD10-I70.0). Electronically Signed   By: Delbert Phenix M.D.   On: 10/07/2019 14:45    Chart has been reviewed   Assessment/Plan  59 y.o. male with medical history significant of Dm2, EtOh abuse, HTN, HLD, tobacco abuse, chronic pnacreatitis  Admitted for pancreatitis  Present on Admission:  . Acute pancreatitis - most likely cause being alcohol induced   CT of abdomen showing acute pancreatitis  no evidence of pseudocyst Will rehydrate with aggressive IV fluids Keep n.p.o.except meds Follow clinically  -  strongly advised patient to stop drinking alcohol -Check lipid panel - Discontinue offending medications ( hydrochlorothiazide, furosemide, valproic acid, mesalamine codeine, conjugated estrogens, enalapril,   metronidazole,) Control pain with IV pain medications If no significant improvement in the next 24 hours may benefit from GI consult    . Alcohol abuse - CIWA ordered  . Elevated LFTs - in the setting of ETOh abuse continue to follow, check hepatitis panel  . Essential hypertension - resume Coreg given bump in Cr hold of on cozaar for tonight  . Mixed hyperlipidemia due to type 2 diabetes mellitus (HCC) stable continue home meds  . Dehydration - will rehydrate  . Hyperglycemia in the setting of DM 2 - non compliance pt not on Long lasting insulin takes 64 units of aspart in AM  For now order SSI and Lantus 10 units while NPO Will likely need to titrate up Will need long-term follow-up and possibly referral to endocrinology Diabetes coordinator consult    Other plan as per orders.  DVT prophylaxis:  SCD     Code Status:    Code Status: Prior FULL CODE  as per patient   I had personally discussed CODE STATUS with patient     Family Communication:   Family not at  Bedside   Disposition Plan:       To home once workup is complete and patient is stable   Following barriers for discharge:                            Electrolytes corrected                                Pain controlled with PO medications                             Will need to be able to tolerate PO                                                               Diabetes care coordinator                   Transition of care consulted                                        Consults called: none    Admission status:  ED Disposition    ED Disposition Condition Comment   Admit  [facility] [[id number]]  Level of Care: Telemetry [5]  Admit to tele based on following criteria: Other see comments  Comments: pancreatitis  May admit patient to [facility] or [facility] if equivalent level of care is available:: No  Covid Evaluation: Confirmed COVID Negative  Diagnosis: Pancreatitis [[id number]]  Admitting Physician: Therisa Doyne [[id number]]  Attending Physician: Therisa Doyne [[id number]]  Estimated length of stay: past midnight tomorrow  Certification:: I certify this patient will need inpatient services for at least 2 midnights          inpatient     I Expect 2 midnight stay secondary to severity of patient's current illness need for inpatient interventions justified by the following:   Severe lab/radiological/exam abnormalities including: Pancreatitis   and extensive comorbidities including:  substance abuse   DM2    liver disease   That are currently affecting medical management.   I expect  patient to be hospitalized for 2 midnights requiring inpatient medical care.  Patient is at high risk for adverse outcome (such as loss of life or disability) if not treated.  Indication for inpatient stay as follows:    severe pain requiring acute inpatient management,  inability to maintain oral hydration   Need for IV fluids,  , IV pain medications,      Level of care      tele  For  12H      Lab Results  Component Value Date   SARSCOV2NAA NEGATIVE 10/07/2019     Precautions: admitted as  Covid Negative     PPE: Used by the provider:   N95  eye Goggles,  Gloves      Armon Orvis 10/07/2019, 8:21 PM    [facility]     after 2 AM please page floor coverage PA If 7AM-7PM, please contact the day team taking care of the patient using [email or web address]   Patient was evaluated in the context of the global COVID-19 pandemic, which necessitated consideration that the patient might be at risk for infection with the SARS-CoV-2 virus that causes COVID-19. Institutional protocols and algorithms that pertain to the evaluation of patients at risk for COVID-19 are in a state of rapid change based on information released by regulatory bodies including the CDC and federal and state organizations. These policies and algorithms were followed during the patient's care.

## 2019-10-07 NOTE — ED Provider Notes (Signed)
Patient placed in Quick Look pathway, seen and evaluated   Chief Complaint: abd pain  HPI:  59 y/o M with a h/o pancreatitis presenting with 4-5 day h/o abd pain. C/o diffuse pain worse to the upper abd. Associated with NVD. Denies melena or bloody stools. States that he usually requires admission when he has pancreatitis.   ROS: abd pain, NVD (one)  Physical Exam:   Gen: No distress  Neuro: Awake and Alert  Skin: Warm    Focused Exam: TTP to the epigastrium, RUQ, and LUQ with guarding.    Initiation of care has begun. The patient has been counseled on the process, plan, and necessity for staying for the completion/evaluation, and the remainder of the medical screening examination    Rayne Du 10/07/19 1325    Gerhard Munch, MD 10/07/19 1546

## 2019-10-07 NOTE — ED Provider Notes (Signed)
[facility]-EMERGENCY DEPT Provider Note   CSN: [id number] Arrival date & time: 10/06/19  1722    History Chief Complaint  Patient presents with  . Abdominal Pain  . Constipation    Ricardo Howard is a 60 y.o. male with past medical history significant for alcohol abuse, diabetes, hypertension, hyperlipidemia who presents for evaluation abdominal pain.  Patient with epigastric abdominal pain x3 to 4 days.  Initially thought he was constipated however had bowel movement this morning without melena or bright red blood per rectum.  He is a chronic alcohol drinker, drinks approximately 6-8 beers daily.  His last drink was approximately 3 days ago.  He has had multiple episodes of NB emesis.  No prior history of esophageal varices.  Denies fever, chills, headache, lightness, dizziness, chest pain, shortness of breath, hemoptysis, hematochezia, diarrhea, dysuria, weakness.  Denies additional aggravating or alleviating factors. Rates his pain a 15/10. Described as sharp, cramping.  Patient has had multiple previous admissions for pancreatitis.  Was seen by GI and was told he needed to stop drinking alcohol however patient has not done this.  He did receive his Covid vaccines.  Has not taken anything for his pain.  Denies additional aggravating or alleviating factors.  History obtained from patient and past medical records.  No interpreter used.  HPI     Past Medical History:  Diagnosis Date  . Alcohol abuse 06/14/2019  . Diabetes mellitus without complication (HCC)   . Essential hypertension 06/14/2019  . Mixed hyperlipidemia due to type 2 diabetes mellitus (HCC) 07/31/2019  . Nicotine dependence, cigarettes, uncomplicated 07/31/2019  . Pancreatitis   . Uncontrolled type 2 diabetes mellitus with diabetic polyneuropathy, with long-term current use of insulin (HCC) 07/31/2019    Patient Active Problem List   Diagnosis Date Noted  . Occult blood in stools   . Mixed  hyperlipidemia due to type 2 diabetes mellitus (HCC) 07/31/2019  . Uncontrolled type 2 diabetes mellitus with diabetic polyneuropathy, with long-term current use of insulin (HCC) 07/31/2019  . Nicotine dependence, cigarettes, uncomplicated 07/31/2019  . Complaint of melena 07/31/2019  . Pancreatic duct calculus 06/21/2019  . Elevated LFTs 06/21/2019  . DKA (diabetic ketoacidoses) (HCC) 06/14/2019  . Acute pancreatitis 06/14/2019  . Alcohol abuse 06/14/2019  . Essential hypertension 06/14/2019    Past Surgical History:  Procedure Laterality Date  . CORONARY ANGIOPLASTY WITH STENT PLACEMENT         Family History  Problem Relation Age of Onset  . Other Neg Hx     Social History   Tobacco Use  . Smoking status: Current Every Day Smoker    Packs/day: 0.25    Types: Cigarettes  . Smokeless tobacco: Never Used  Vaping Use  . Vaping Use: Never used  Substance Use Topics  . Alcohol use: Yes  . Drug use: No    Home Medications Prior to Admission medications   Medication Sig Start Date End Date Taking? Authorizing Provider  aspirin 81 MG tablet Take 81 mg by mouth daily.   Yes [provider]  atorvastatin (LIPITOR) 80 MG tablet Take 80 mg by mouth every evening.   Yes [provider]  buPROPion (WELLBUTRIN SR) 150 MG 12 hr tablet Take 150 mg by mouth in the morning and at bedtime.   Yes [provider]  carvedilol (COREG CR) 20 MG 24 hr capsule Take 20 mg by mouth daily.   Yes [provider]  gabapentin (NEURONTIN) 300 MG capsule Take 300  mg by mouth in the morning and at bedtime.   Yes [provider]  insulin aspart (NOVOLOG) 100 UNIT/ML injection Inject 64 Units into the skin in the morning.   Yes [provider]  lipase/protease/amylase (CREON) 36000 UNITS CPEP capsule Take 1 capsule (36,000 Units total) by mouth 3 (three) times daily before meals. 06/17/19  Yes Roberto Scales D, MD  losartan (COZAAR) 50 MG tablet Take 1  tablet (50 mg total) by mouth daily. 08/04/19 10/07/19 Yes Kyle, Tyrone A, DO  Multiple Vitamin (QUINTABS) TABS Take 1 tablet by mouth daily. 11/22/18  Yes [provider]  zolpidem (AMBIEN) 10 MG tablet Take 10 mg by mouth at bedtime.    Yes [provider]  pantoprazole (PROTONIX) 40 MG tablet Take 1 tablet (40 mg total) by mouth daily. 08/03/19 09/02/19  Margie Ege A, DO  potassium chloride SA (KLOR-CON) 20 MEQ tablet Take 1 tablet (20 mEq total) by mouth 2 (two) times daily for 3 days. 08/03/19 08/06/19  Margie Ege A, DO    Allergies    Iohexol, Lisinopril, and Simvastatin  Review of Systems   Review of Systems  Constitutional: Negative.   HENT: Negative.   Respiratory: Negative.   Cardiovascular: Negative.   Gastrointestinal: Positive for abdominal pain, nausea and vomiting. Negative for abdominal distention, anal bleeding, blood in stool, constipation, diarrhea and rectal pain.  Genitourinary: Negative.   Musculoskeletal: Negative.   Skin: Negative.   Neurological: Negative.   All other systems reviewed and are negative.   Physical Exam Updated Vital Signs BP (!) 172/110   Pulse 99   Temp 98.1 F (36.7 C) (Oral)   Resp 16   SpO2 97%   Physical Exam Vitals and nursing note reviewed.  Constitutional:      General: He is not in acute distress.    Appearance: He is well-developed. He is not ill-appearing, toxic-appearing or diaphoretic.  HENT:     Head: Normocephalic and atraumatic.     Mouth/Throat:     Mouth: Mucous membranes are moist.  Eyes:     Pupils: Pupils are equal, round, and reactive to light.  Cardiovascular:     Rate and Rhythm: Regular rhythm. Tachycardia present.     Heart sounds: Normal heart sounds.     Comments: HR 105 in room Pulmonary:     Effort: Pulmonary effort is normal. No respiratory distress.     Breath sounds: Normal breath sounds.     Comments: Speaks in full sentences without difficulty. Lungs clear to auscultation  bilaterally  Abdominal:     General: Bowel sounds are normal. There is no distension.     Palpations: Abdomen is soft.     Tenderness: There is abdominal tenderness in the epigastric area and left upper quadrant. There is guarding. There is no right CVA tenderness, left CVA tenderness or rebound. Negative signs include Murphy's sign and McBurney's sign.     Hernia: No hernia is present.  Musculoskeletal:        General: Normal range of motion.     Cervical back: Normal range of motion and neck supple.  Skin:    General: Skin is warm and dry.     Capillary Refill: Capillary refill takes less than 2 seconds.  Neurological:     General: No focal deficit present.     Mental Status: He is alert and oriented to person, place, and time.     Comments: No tremors. Gait without ataxia.     ED  Results / Procedures / Treatments   Labs (all labs ordered are listed, but only abnormal results are displayed) Labs Reviewed  LIPASE, BLOOD - Abnormal; Notable for the following components:      Result Value   Lipase 640 (*)    All other components within normal limits  COMPREHENSIVE METABOLIC PANEL - Abnormal; Notable for the following components:   Sodium 133 (*)    Chloride 94 (*)    Glucose, Bld 342 (*)    AST 49 (*)    All other components within normal limits  URINALYSIS, ROUTINE W REFLEX MICROSCOPIC - Abnormal; Notable for the following components:   Glucose, UA >=500 (*)    Hgb urine dipstick SMALL (*)    Ketones, ur 80 (*)    Protein, ur 30 (*)    All other components within normal limits  CBG MONITORING, ED - Abnormal; Notable for the following components:   Glucose-Capillary 280 (*)    All other components within normal limits  SARS CORONAVIRUS 2 BY RT PCR (HOSPITAL ORDER, PERFORMED IN [facility])  CBC  ETHANOL  RAPID URINE DRUG SCREEN, HOSP PERFORMED  PROTIME-INR  CBG MONITORING, ED    EKG None  Radiology CT ABDOMEN PELVIS WO CONTRAST  Result Date:  10/07/2019 CLINICAL DATA:  Diffuse upper abdominal pain for 5 days. History of pancreatitis. EXAM: CT ABDOMEN AND PELVIS WITHOUT CONTRAST TECHNIQUE: Multidetector CT imaging of the abdomen and pelvis was performed following the standard protocol without IV contrast. COMPARISON:  07/31/2019 CT abdomen/pelvis. FINDINGS: Lower chest: No significant pulmonary nodules or acute consolidative airspace disease. Coronary atherosclerosis. Hepatobiliary: Diffuse hepatic steatosis. Normal gallbladder with no radiopaque cholelithiasis. No biliary ductal dilatation. CBD diameter 4 mm. Pancreas: There is mild diffuse haziness of the peripancreatic fat compatible with acute pancreatitis. Chronic coarse calcifications in the pancreatic head are unchanged and indicate underlying chronic pancreatitis. No discrete pancreatic mass. No pancreatic duct dilation. Spleen: Normal size. No mass. Adrenals/Urinary Tract: Stable adrenal glands with mild lobular thickening of both adrenal glands, without discrete adrenal nodules. No hydronephrosis. No renal stones. No contour deforming renal masses. Normal bladder. Stomach/Bowel: Normal non-distended stomach. Normal caliber small bowel with no small bowel wall thickening. Normal appendix. Normal large bowel with no diverticulosis, large bowel wall thickening or pericolonic fat stranding. Vascular/Lymphatic: Atherosclerotic nonaneurysmal abdominal aorta. No pathologically enlarged lymph nodes in the abdomen or pelvis. Reproductive: Normal size prostate. Other: No pneumoperitoneum, ascites or focal fluid collection. Musculoskeletal: No aggressive appearing focal osseous lesions. Mild thoracolumbar spondylosis. IMPRESSION: 1. Noncontrast CT findings are compatible with acute on chronic pancreatitis. Cannot assess for necrotizing pancreatitis given the absence of IV contrast. No biliary ductal dilatation. No evidence of pancreatic pseudocysts. 2. Diffuse hepatic steatosis. 3. Coronary  atherosclerosis. 4. Aortic Atherosclerosis (ICD10-I70.0). Electronically Signed   By: Delbert Phenix M.D.   On: 10/07/2019 14:45   Procedures Procedures (including critical care time)  Medications Ordered in ED Medications  LORazepam (ATIVAN) injection 0-4 mg (0 mg Intravenous Not Given 10/07/19 1554)    Or  LORazepam (ATIVAN) tablet 0-4 mg ( Oral See Alternative 10/07/19 1554)  LORazepam (ATIVAN) injection 0-4 mg (has no administration in time range)    Or  LORazepam (ATIVAN) tablet 0-4 mg (has no administration in time range)  thiamine tablet 100 mg ( Oral See Alternative 10/07/19 1553)    Or  thiamine (B-1) injection 100 mg (100 mg Intravenous Given 10/07/19 1553)  sodium chloride 0.9 % bolus 1,000 mL (0 mLs Intravenous Stopped  10/07/19 1720)  morphine 4 MG/ML injection 4 mg (4 mg Intravenous Given 10/07/19 1553)  ondansetron (ZOFRAN) injection 4 mg (4 mg Intravenous Given 10/07/19 1553)  insulin aspart (novoLOG) injection 10 Units (5 Units Subcutaneous Given 10/07/19 1654)  pantoprazole (PROTONIX) injection 40 mg (40 mg Intravenous Given 10/07/19 1700)  promethazine (PHENERGAN) injection 12.5 mg (12.5 mg Intravenous Given 10/07/19 1701)  HYDROmorphone (DILAUDID) injection 1 mg (1 mg Intravenous Given 10/07/19 1701)  labetalol (NORMODYNE) injection 5 mg (5 mg Intravenous Given 10/07/19 1832)   ED Course  I have reviewed the triage vital signs and the nursing notes.  Pertinent labs & imaging results that were available during my care of the patient were reviewed by me and considered in my medical decision making (see chart for details).  59 year old male presents for evaluation of abdominal pain.  He is afebrile, nonseptic, not ill-appearing.  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  Continues to drink 6-8 beers daily.  Last drink 3 days ago at onset of abdominal pain.  ABD pain located diffusely however worse to epigastric, left upper quadrant.  He has negative Murphy sign.  He is  guarding.  He is mildly tachycardic however does not appear tremulous.  Has mildly elevated blood pressure however has not been able to keep down his home medications.  Will add on CIWA given alcohol use.  NBNB emesis, last bowel movement this morning without melena or bright red blood per rectum.  Labs and imaging obtained from triage person reviewed and interpreted: CBC without leukocytosis CMP without pseudohyponatremia, hyperglycemia at 342, AST 49 likely due to EtOh abuse, Anion gap 15 COVID negative Lipase 640 UA ketonuria and glucosuria  Ethanol <10 CT AP with acute on chronic pancreatitis.  Unfortunately patient has allergy to contrast dye. EKG without STEMI  Patients CIWA 0, Repeat CBG at 280  Patient reassessed.Pain rated a 9/10 with persistent nausea. Will add additional antiemetics and pain meds and reassess.   Patient reassessed. Pain still rated a 7-8/10. Will admit for intractable pain from acute pancreatitis.  Patient has had some hypertension here in the emergency department.  He has not been able to take his blood pressure medications.  Was able to take small sips of liquids however has had some increase in abdominal pain with liquid intake.  No postprandial emesis.  Will give 1 dose of labetalol for hypertension.   CONSULT with Dr. Adela Glimpse with [facility] who will evaluate patient for admission.  Patient does not meet the SIRS or Sepsis criteria.  On repeat exam patient does not have a surgical abdomin and there are no peritoneal signs.  No indication of appendicitis, bowel obstruction, bowel perforation, cholecystitis, diverticulitis. Will admit for pancreatitis likely due to EtOh use.  The patient appears reasonably stabilized for admission considering the current resources, flow, and capabilities available in the ED at this time, and I doubt any other [facility] requiring further screening and/or treatment in the ED prior to admission.  Patient discussed with attending, Dr.  Dalene Seltzer who agrees above treatment, plan and disposition.     MDM Rules/Calculators/A&P                           Final Clinical Impression(s) / ED Diagnoses Final diagnoses:  Alcohol-induced acute pancreatitis, unspecified complication status  Hyperglycemia  Hypertension, unspecified type  Alcohol abuse    Rx / DC Orders ED Discharge Orders    None  Linwood DibblesHenderly, Jaison Petraglia A, PA-C 10/07/19 2012    Alvira MondaySchlossman, Erin, MD 10/08/19 [phone]

## 2019-10-07 NOTE — ED Notes (Signed)
Pt. CBG 233, RN, Holly made aware.

## 2019-10-08 DIAGNOSIS — E860 Dehydration: Secondary | ICD-10-CM

## 2019-10-08 DIAGNOSIS — K852 Alcohol induced acute pancreatitis without necrosis or infection: Principal | ICD-10-CM

## 2019-10-08 DIAGNOSIS — E782 Mixed hyperlipidemia: Secondary | ICD-10-CM

## 2019-10-08 DIAGNOSIS — R7989 Other specified abnormal findings of blood chemistry: Secondary | ICD-10-CM

## 2019-10-08 DIAGNOSIS — Z794 Long term (current) use of insulin: Secondary | ICD-10-CM

## 2019-10-08 DIAGNOSIS — E1165 Type 2 diabetes mellitus with hyperglycemia: Secondary | ICD-10-CM

## 2019-10-08 DIAGNOSIS — F1010 Alcohol abuse, uncomplicated: Secondary | ICD-10-CM

## 2019-10-08 DIAGNOSIS — E1169 Type 2 diabetes mellitus with other specified complication: Secondary | ICD-10-CM

## 2019-10-08 DIAGNOSIS — E1142 Type 2 diabetes mellitus with diabetic polyneuropathy: Secondary | ICD-10-CM

## 2019-10-08 DIAGNOSIS — I10 Essential (primary) hypertension: Secondary | ICD-10-CM

## 2019-10-08 LAB — LIPID PANEL
Cholesterol: 275 mg/dL — ABNORMAL HIGH (ref 0–200)
HDL: 42 mg/dL (ref >40)
LDL Cholesterol: 206 mg/dL — ABNORMAL HIGH (ref 0–99)
Total CHOL/HDL Ratio: 6.5 RATIO
Triglycerides: 137 mg/dL (ref <150)
VLDL: 27 mg/dL (ref 0–40)

## 2019-10-08 LAB — COMPREHENSIVE METABOLIC PANEL
ALT: 29 U/L (ref 0–44)
AST: 26 U/L (ref 15–41)
Albumin: 3.4 g/dL — ABNORMAL LOW (ref 3.5–5.0)
Alkaline Phosphatase: 64 U/L (ref 38–126)
Anion gap: 10 (ref 5–15)
BUN: 12 mg/dL (ref 6–20)
CO2: 23 mmol/L (ref 22–32)
Calcium: 8.5 mg/dL — ABNORMAL LOW (ref 8.9–10.3)
Chloride: 103 mmol/L (ref 98–111)
Creatinine, Ser: 0.56 mg/dL — ABNORMAL LOW (ref 0.61–1.24)
GFR calc Af Amer: >60 mL/min (ref >60)
GFR calc non Af Amer: >60 mL/min (ref >60)
Glucose, Bld: 167 mg/dL — ABNORMAL HIGH (ref 70–99)
Potassium: 3.8 mmol/L (ref 3.5–5.1)
Sodium: 136 mmol/L (ref 135–145)
Total Bilirubin: 0.8 mg/dL (ref 0.3–1.2)
Total Protein: 7 g/dL (ref 6.5–8.1)

## 2019-10-08 LAB — CBC WITH DIFFERENTIAL/PLATELET
Abs Immature Granulocytes: 0.05 10*3/uL (ref 0.00–0.07)
Basophils Absolute: 0 10*3/uL (ref 0.0–0.1)
Basophils Relative: 0 %
Eosinophils Absolute: 0.1 10*3/uL (ref 0.0–0.5)
Eosinophils Relative: 1 %
HCT: 45.9 % (ref 39.0–52.0)
Hemoglobin: 15.5 g/dL (ref 13.0–17.0)
Immature Granulocytes: 1 %
Lymphocytes Relative: 18 %
Lymphs Abs: 1.5 10*3/uL (ref 0.7–4.0)
MCH: 32.2 pg (ref 26.0–34.0)
MCHC: 33.8 g/dL (ref 30.0–36.0)
MCV: 95.4 fL (ref 80.0–100.0)
Monocytes Absolute: 0.7 10*3/uL (ref 0.1–1.0)
Monocytes Relative: 8 %
Neutro Abs: 5.9 10*3/uL (ref 1.7–7.7)
Neutrophils Relative %: 72 %
Platelets: 159 10*3/uL (ref 150–400)
RBC: 4.81 MIL/uL (ref 4.22–5.81)
RDW: 14.2 % (ref 11.5–15.5)
WBC: 8.1 10*3/uL (ref 4.0–10.5)
nRBC: 0 % (ref 0.0–0.2)

## 2019-10-08 LAB — GLUCOSE, CAPILLARY
Glucose-Capillary: 121 mg/dL — ABNORMAL HIGH (ref 70–99)
Glucose-Capillary: 127 mg/dL — ABNORMAL HIGH (ref 70–99)
Glucose-Capillary: 161 mg/dL — ABNORMAL HIGH (ref 70–99)
Glucose-Capillary: 176 mg/dL — ABNORMAL HIGH (ref 70–99)
Glucose-Capillary: 76 mg/dL (ref 70–99)
Glucose-Capillary: 77 mg/dL (ref 70–99)

## 2019-10-08 LAB — PHOSPHORUS
Phosphorus: 3 mg/dL (ref 2.5–4.6)
Phosphorus: 2.5 mg/dL (ref 2.5–4.6)

## 2019-10-08 LAB — MAGNESIUM
Magnesium: 2.3 mg/dL (ref 1.7–2.4)
Magnesium: 2.1 mg/dL (ref 1.7–2.4)

## 2019-10-08 LAB — HEMOGLOBIN A1C
Hgb A1c MFr Bld: 11.9 % — ABNORMAL HIGH (ref 4.8–5.6)
Mean Plasma Glucose: 294.83 mg/dL

## 2019-10-08 LAB — LIPASE, BLOOD: Lipase: 123 U/L — ABNORMAL HIGH (ref 11–51)

## 2019-10-08 LAB — HEPATITIS PANEL, ACUTE
HCV Ab: REACTIVE — AB
Hep A IgM: NONREACTIVE
Hep B C IgM: NONREACTIVE
Hepatitis B Surface Ag: NONREACTIVE

## 2019-10-08 LAB — TSH: TSH: 2.72 u[IU]/mL (ref 0.350–4.500)

## 2019-10-08 MED ORDER — BUPROPION HCL ER (SR) 150 MG PO TB12
150.0000 mg | ORAL_TABLET | Freq: Two times a day (BID) | ORAL | Status: DC
  Administered 2019-10-08 – 2019-10-10 (×5): 150 mg via ORAL
  Filled 2019-10-08 (×5): qty 1

## 2019-10-08 MED ORDER — PANTOPRAZOLE SODIUM 40 MG IV SOLR
40.0000 mg | INTRAVENOUS | Status: DC
  Administered 2019-10-08 – 2019-10-10 (×3): 40 mg via INTRAVENOUS
  Filled 2019-10-08 (×3): qty 40

## 2019-10-08 MED ORDER — ENOXAPARIN SODIUM 40 MG/0.4ML ~~LOC~~ SOLN
40.0000 mg | SUBCUTANEOUS | Status: DC
  Administered 2019-10-08 – 2019-10-10 (×3): 40 mg via SUBCUTANEOUS
  Filled 2019-10-08 (×3): qty 0.4

## 2019-10-08 MED ORDER — SODIUM CHLORIDE 0.9 % IV BOLUS
1000.0000 mL | Freq: Once | INTRAVENOUS | Status: AC
  Administered 2019-10-08: 1000 mL via INTRAVENOUS

## 2019-10-08 MED ORDER — LOSARTAN POTASSIUM 50 MG PO TABS
50.0000 mg | ORAL_TABLET | Freq: Every day | ORAL | Status: DC
  Administered 2019-10-08 – 2019-10-10 (×3): 50 mg via ORAL
  Filled 2019-10-08 (×3): qty 1

## 2019-10-08 NOTE — Progress Notes (Signed)
Initial Nutrition Assessment  DOCUMENTATION CODES:   Not applicable  INTERVENTION:  - diet advancement as medically feasible.  NUTRITION DIAGNOSIS:   Increased nutrient needs related to acute illness, chronic illness as evidenced by estimated needs.  GOAL:   Patient will meet greater than or equal to 90% of their needs  MONITOR:   Diet advancement, Labs, Weight trends  REASON FOR ASSESSMENT:   Consult Malnutrition Eval  ASSESSMENT:   59 y.o. male with medical history of type 2 DM, alcohol abuse, HTN, HLD, tobacco abuse, and chronic pancreatitis. He presented to the ED with abdominal pain and N/V which he reported is similar to prior episodes of pancreatitis. His last admission for the same was in 07/2019. Patient continues to drink alcohol, ~6 beers/day.  Patient has been NPO since admission. He denies any changes in appetite or intakes prior to sudden onset of symptoms. He denies any recent weight changes.   Weight today is 178 lb and weight on 6/12 was 181 lb. This indicates 3 lb weight loss in 2 months.   Per notes: - acute on chronic pancreatitis without pseudocyst - ongoing alcohol abuse  - dehydration on admission   Labs reviewed; CBGs: 176, 161, 127, 121 mg/dl, creatinine: 1.95 mg/dl, Ca: 8.5 mg/dl, lipase: 093 u/l. Medications reviewed; 100 mg colace BID, 1 mg folvite/day, sliding scale novolog, 10 units lantus/day, 1 tablet multivitamin with minerals/day, 40 mg IV protonix/day, 100 mg thiamine/day.  IVF; NS @ 150 ml/hr.     NUTRITION - FOCUSED PHYSICAL EXAM:  completed; no muscle or fat depletions; no edema noted at this time.   Diet Order:   Diet Order            Diet NPO time specified Except for: Sips with Meds  Diet effective now                 EDUCATION NEEDS:   Not appropriate for education at this time  Skin:  Skin Assessment: Reviewed RN Assessment  Last BM:  8/18  Height:   Ht Readings from Last 1 Encounters:  10/08/19 5\' 8"   (1.727 m)    Weight:   Wt Readings from Last 1 Encounters:  10/08/19 80.9 kg     Estimated Nutritional Needs:  Kcal:  1875-2050 kcal Protein:  85-100 grams Fluid:  >/= 2.2 L/day     10/10/19, MS, RD, LDN, CNSC Inpatient Clinical Dietitian RD pager # available in [facility]  After hours/weekend pager # available in [facility]

## 2019-10-08 NOTE — Progress Notes (Signed)
PROGRESS NOTE    Ricardo Howard  VOZ:[id number] DOB: [birth year] DOA: 10/07/2019 [facility]CP: [facility]    Chief Complaint  Patient presents with  . Abdominal Pain  . Constipation    Brief Narrative:  HPI per Dr. Rennis Chris Zacharias Ridling is a 59 y.o. male with medical history significant of Dm2, EtOh abuse, HTN, HLD, tobacco abuse, chronic pnacreatitis     Presented with   Abdominal pain nausea vomiting similar to prior episodes of pancreatitis Last admission for pancreatitis was in June 2021.  At that time CT scan showed pancreatic stone.  Patient has episodes of recurrent pancreatitis most likely combination of EtOH use as well as retained pancreatic duct stone. Patient was discharged on 14 June and was instructed to stop drinking he continues to drink at this time.  Drinks about 6 beers a day.  Denies any history of withdrawals DTs or seizures.  Infectious risk factors:  Reports  N/V/Diarrhea/abdominal pain,     Has  been vaccinated against COVID    Initial COVID TEST  NEGATIVE    Assessment & Plan:   Active Problems:   Acute pancreatitis   Alcohol abuse   Essential hypertension   Elevated LFTs   Mixed hyperlipidemia due to type 2 diabetes mellitus (HCC)   Uncontrolled type 2 diabetes mellitus with diabetic polyneuropathy, with long-term current use of insulin (HCC)   Pancreatitis   Dehydration   Hyperglycemia   1 acute on chronic pancreatitis Alcohol alcohol induced.  CT abdomen consistent with acute pancreatitis with no evidence of pseudocyst or pancreatic ductal dilatation.  Patient strongly advised to stop drinking alcohol.  Continue n.p.o. except for meds.  Fasting lipid panel with a LDL of 206.  Triglycerides of 137.  Increase IV fluids to 150 cc/h.  Given 1 L normal saline bolus.  DC Creon.  Antiemetics.  Supportive care.  2.  Hypertension Continue home regimen Coreg.  Resume Cozaar.  Follow.  3.  Uncontrolled diabetes mellitus type  2 Hemoglobin A1c 11.9.  Patient with a history of medication noncompliance. CBG of 127 this morning.  Continue Lantus 10 units daily.  Sliding scale insulin.  4.  History of alcohol abuse Alcohol cessation stressed to patient.  Patient with no signs of DTs.  Continue to CIWA Ativan withdrawal protocol, folic acid, thiamine, multivitamin.  Supportive care.  5.  Dehydration IV fluids.  6.  Hyperlipidemia Continue home regimen Lipitor.  7.  Transaminitis Likely secondary to alcohol abuse.  LFTs have trended down.  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  Check a HCV RNA quantitative level.  Follow.   DVT prophylaxis: Lovenox Code Status: Full Family Communication: Updated patient.  No family at bedside. Disposition:   Status is: Inpatient    Dispo: The patient is from: Home              Anticipated d/c is to: Home              Anticipated d/c date is: 3 to 4 days              Patient currently with acute on chronic pancreatitis, still with significant epigastric pain, on IV fluids, pain medication.  Not stable for discharge.       Consultants:   None  Procedures:  CT abdomen and pelvis 10/07/2019    Antimicrobials:  None   Subjective: Patient laying in bed.  Still with complaints of epigastric abdominal pain with no significant change.  Denies any further  emesis.  Some nausea.  Objective: Vitals:   10/08/19 0005 10/08/19 0448 10/08/19 1030 10/08/19 1154  BP:  (!) 155/104 (!) 158/100 (!) 151/98  Pulse:  87 89 85  Resp:  17  18  Temp:  98 F (36.7 C) 98 F (36.7 C) 97.8 F (36.6 C)  TempSrc:  Oral Oral Oral  SpO2:  96% 98% 97%  Weight: 80.9 kg     Height: 5\' 8"  (1.727 m)       Intake/Output Summary (Last 24 hours) at 10/08/2019 1648 Last data filed at 10/08/2019 0850 Gross per 24 hour  Intake 1599.37 ml  Output 625 ml  Net 974.37 ml   Filed Weights   10/08/19 0005  Weight: 80.9 kg    Examination:  General exam: Appears calm and  comfortable  Respiratory system: Clear to auscultation. Respiratory effort normal. Cardiovascular system: S1 & S2 heard, RRR. No JVD, murmurs, rubs, gallops or clicks. No pedal edema. Gastrointestinal system: Abdomen is soft, tender to palpation in the epigastric, positive bowel sounds.  No rebound.  No guarding.  Central nervous system: Alert and oriented. No focal neurological deficits. Extremities: Symmetric 5 x 5 power. Skin: No rashes, lesions or ulcers Psychiatry: Judgement and insight appear normal. Mood & affect appropriate.     Data Reviewed: I have personally reviewed following labs and imaging studies  CBC: Recent Labs  Lab 10/06/19 1924 10/07/19 1921 10/08/19 0544  WBC 10.1 10.3 8.1  NEUTROABS  --   --  5.9  HGB 15.6 14.9 15.5  HCT 46.8 44.5 45.9  MCV 95.5 96.7 95.4  PLT 205 173 159    Basic Metabolic Panel: Recent Labs  Lab 10/06/19 1924 10/07/19 1921 10/08/19 0544  NA 133* 133* 136  K 5.0 4.1 3.8  CL 94* 100 103  CO2 24 19* 23  GLUCOSE 342* 204* 167*  BUN 11 16 12   CREATININE 0.70 0.66 0.56*  CALCIUM 9.3 8.3* 8.5*  MG  --  2.3  2.2 2.1  PHOS  --  3.0  3.1 2.5    GFR: Estimated Creatinine Clearance: 95 mL/min (A) (by C-G formula based on SCr of 0.56 mg/dL (L)).  Liver Function Tests: Recent Labs  Lab 10/06/19 1924 10/07/19 1921 10/08/19 0544  AST 49* 34 26  ALT 43 33 29  ALKPHOS 80 68 64  BILITOT 1.1 1.2 0.8  PROT 8.0 7.3 7.0  ALBUMIN 4.3 3.7 3.4*    CBG: Recent Labs  Lab 10/08/19 0048 10/08/19 0446 10/08/19 0731 10/08/19 1148 10/08/19 1554  GLUCAP 176* 161* 127* 121* 77     Recent Results (from the past 240 hour(s))  SARS Coronavirus 2 by RT PCR (hospital order, performed in [facility] hospital lab) Nasopharyngeal Nasopharyngeal Swab     Status: None   Collection Time: 10/07/19  1:24 PM   Specimen: Nasopharyngeal Swab  Result Value Ref Range Status   SARS Coronavirus 2 NEGATIVE NEGATIVE Final    Comment:  (NOTE) SARS-CoV-2 target nucleic acids are NOT DETECTED.  The SARS-CoV-2 RNA is generally detectable in upper and lower respiratory specimens during the acute phase of infection. The lowest concentration of SARS-CoV-2 viral copies this assay can detect is 250 copies / mL. A negative result does not preclude SARS-CoV-2 infection and should not be used as the sole basis for treatment or other patient management decisions.  A negative result may occur with improper specimen collection / handling, submission of specimen other than nasopharyngeal swab, presence of viral mutation(s) within the  areas targeted by this assay, and inadequate number of viral copies (<250 copies / mL). A negative result must be combined with clinical observations, patient history, and epidemiological information.  Fact Sheet for Patients:   BoilerBrush.com.cyhttps://www.fda.gov/media/136312/download  Fact Sheet for Healthcare Providers: https://pope.com/https://www.fda.gov/media/136313/download  This test is not yet approved or  cleared by the [place] FDA and has been authorized for detection and/or diagnosis of SARS-CoV-2 by FDA under an Emergency Use Authorization (EUA).  This EUA will remain in effect (meaning this test can be used) for the duration of the COVID-19 declaration under Section 564(b)(1) of the Act, 21 U.S.C. section 360bbb-3(b)(1), unless the authorization is terminated or revoked sooner.  Performed at [facility], [address] [place], [place] [address]          Radiology Studies: CT ABDOMEN PELVIS WO CONTRAST  Result Date: 10/07/2019 CLINICAL DATA:  Diffuse upper abdominal pain for 5 days. History of pancreatitis. EXAM: CT ABDOMEN AND PELVIS WITHOUT CONTRAST TECHNIQUE: Multidetector CT imaging of the abdomen and pelvis was performed following the standard protocol without IV contrast. COMPARISON:  07/31/2019 CT abdomen/pelvis. FINDINGS: Lower chest: No significant pulmonary nodules or acute  consolidative airspace disease. Coronary atherosclerosis. Hepatobiliary: Diffuse hepatic steatosis. Normal gallbladder with no radiopaque cholelithiasis. No biliary ductal dilatation. CBD diameter 4 mm. Pancreas: There is mild diffuse haziness of the peripancreatic fat compatible with acute pancreatitis. Chronic coarse calcifications in the pancreatic head are unchanged and indicate underlying chronic pancreatitis. No discrete pancreatic mass. No pancreatic duct dilation. Spleen: Normal size. No mass. Adrenals/Urinary Tract: Stable adrenal glands with mild lobular thickening of both adrenal glands, without discrete adrenal nodules. No hydronephrosis. No renal stones. No contour deforming renal masses. Normal bladder. Stomach/Bowel: Normal non-distended stomach. Normal caliber small bowel with no small bowel wall thickening. Normal appendix. Normal large bowel with no diverticulosis, large bowel wall thickening or pericolonic fat stranding. Vascular/Lymphatic: Atherosclerotic nonaneurysmal abdominal aorta. No pathologically enlarged lymph nodes in the abdomen or pelvis. Reproductive: Normal size prostate. Other: No pneumoperitoneum, ascites or focal fluid collection. Musculoskeletal: No aggressive appearing focal osseous lesions. Mild thoracolumbar spondylosis. IMPRESSION: 1. Noncontrast CT findings are compatible with acute on chronic pancreatitis. Cannot assess for necrotizing pancreatitis given the absence of IV contrast. No biliary ductal dilatation. No evidence of pancreatic pseudocysts. 2. Diffuse hepatic steatosis. 3. Coronary atherosclerosis. 4. Aortic Atherosclerosis (ICD10-I70.0). Electronically Signed   By: Delbert PhenixJason A Poff M.D.   On: 10/07/2019 14:45        Scheduled Meds: . atorvastatin  80 mg Oral QPM  . buPROPion  150 mg Oral BID  . carvedilol  20 mg Oral Daily  . docusate sodium  100 mg Oral BID  . folic acid  1 mg Oral Daily  . gabapentin  300 mg Oral BID  . insulin aspart  0-9 Units  Subcutaneous Q4H  . insulin glargine  10 Units Subcutaneous QHS  . LORazepam  0-4 mg Intravenous Q6H   Or  . LORazepam  0-4 mg Oral Q6H  . [START ON 10/09/2019] LORazepam  0-4 mg Intravenous Q12H   Or  . [START ON 10/09/2019] LORazepam  0-4 mg Oral Q12H  . losartan  50 mg Oral Daily  . multivitamin with minerals  1 tablet Oral Daily  . nicotine  21 mg Transdermal Daily  . pantoprazole (PROTONIX) IV  40 mg Intravenous Q24H  . sodium chloride flush  3 mL Intravenous Q12H  . thiamine  100 mg Oral Daily   Or  . thiamine  100 mg Intravenous Daily   Continuous Infusions: . sodium chloride 150 mL/hr at 10/08/19 0846     LOS: 1 day    Time spent: 35 minutes    Ramiro Harvest, MD [facility]   To contact the attending provider between 7A-7P or the covering provider during after hours 7P-7A, please log into the web site [email or web address] and access using universal [place] password for that web site. If you do not have the password, please call the hospital operator.  10/08/2019, 4:48 PM

## 2019-10-08 NOTE — Progress Notes (Signed)
Inpatient Diabetes Program Recommendations  AACE/ADA: New Consensus Statement on Inpatient Glycemic Control (2015)  Target Ranges:  Prepandial:   less than 140 mg/dL      Peak postprandial:   less than 180 mg/dL (1-2 hours)      Critically ill patients:  140 - 180 mg/dL   Lab Results  Component Value Date   GLUCAP 127 (H) 10/08/2019   HGBA1C 11.9 (H) 10/08/2019    Review of Glycemic Control  Diabetes history: DM2 Outpatient Diabetes medications: Novolog 64 units in am Current orders for Inpatient glycemic control:  Novolog 0-9 units Q4H  HgbA1C - 11.9% - poor glycemic control  CBGs 161, 127. (Received 1x dose of Lantus 10 units last night at 2300.)  Inpatient Diabetes Program Recommendations:     Add Lantus 10 units QHS When eating, will likely need meal coverage insulin.  Spoke with pt on 06/17/19 regarding his HgbA1C of 11.1%. Discussed importance of getting blood sugars controlled and pt states he is willing to take meal coverage insulin if needed. [place] Spaniel he gets meds from [name]. Admits to not checking blood sugars at home in a long time. Willing to start  checking again at least 3x/day.  Will follow glucose trends and make recs for insulin adjustments while inpatient.  Continue to follow.  Thank you. Ailene Ards, RD, LDN, CDE Inpatient Diabetes Coordinator [phone]

## 2019-10-08 NOTE — TOC Initial Note (Signed)
Transition of Care [facility]) - Initial/Assessment Note    Patient Details  Name: Ricardo Howard MRN: [id number] Date of Birth: 01-Oct-1960  [facility] CM/SW Contact:    Lanier Clam, RN Phone Number: 10/08/2019, 12:35 PM  Clinical Narrative: Spoke to patient about d/c plans-going home. Has pcp,pharmacy,declines counseling for etoh,he will f/u with [facility] clinic on transport to pcp appts. Has own transport home. No further CM needs.                  Expected Discharge Plan: Home/Self Care Barriers to Discharge: Continued Medical Work up   Patient Goals and CMS Choice Patient states their goals for this hospitalization and ongoing recovery are:: go home   Choice offered to / list presented to : Patient  Expected Discharge Plan and Services Expected Discharge Plan: Home/Self Care   Discharge Planning Services: CM Consult Post Acute Care Choice: NA Living arrangements for the past 2 months: Single Family Home                                      Prior Living Arrangements/Services Living arrangements for the past 2 months: Single Family Home Lives with:: Spouse Patient language and need for interpreter reviewed:: Yes Do you feel safe going back to the place where you live?: Yes      Need for Family Participation in Patient Care: No (Comment) Care giver support system in place?: Yes (comment)   Criminal Activity/Legal Involvement Pertinent to Current Situation/Hospitalization: No - Comment as needed  Activities of Daily Living Home Assistive Devices/Equipment: None ADL Screening (condition at time of admission) Patient's cognitive ability adequate to safely complete daily activities?: Yes Is the patient deaf or have difficulty hearing?: No Does the patient have difficulty seeing, even when wearing glasses/contacts?: No Does the patient have difficulty concentrating, remembering, or making decisions?: No Patient able to express need for assistance  with ADLs?: Yes Does the patient have difficulty dressing or bathing?: No Independently performs ADLs?: Yes (appropriate for developmental age) Does the patient have difficulty walking or climbing stairs?: No Weakness of Legs: None Weakness of Arms/Hands: None  Permission Sought/Granted Permission sought to share information with : Case Manager Permission granted to share information with : Yes, Verbal Permission Granted  Share Information with NAME: Case Manager           Emotional Assessment Appearance:: Appears stated age Attitude/Demeanor/Rapport: Gracious Affect (typically observed): Accepting Orientation: : Oriented to Self, Oriented to Place, Oriented to  Time, Oriented to Situation Alcohol / Substance Use: Alcohol Use Psych Involvement: No (comment)  Admission diagnosis:  Alcohol abuse [F10.10] Pancreatitis [K85.90] Hyperglycemia [R73.9] Alcohol-induced acute pancreatitis, unspecified complication status [K85.20] Hypertension, unspecified type [I10] Patient Active Problem List   Diagnosis Date Noted  . Pancreatitis 10/07/2019  . Dehydration 10/07/2019  . Hyperglycemia 10/07/2019  . Occult blood in stools   . Mixed hyperlipidemia due to type 2 diabetes mellitus (HCC) 07/31/2019  . Uncontrolled type 2 diabetes mellitus with diabetic polyneuropathy, with long-term current use of insulin (HCC) 07/31/2019  . Nicotine dependence, cigarettes, uncomplicated 07/31/2019  . Complaint of melena 07/31/2019  . Pancreatic duct calculus 06/21/2019  . Elevated LFTs 06/21/2019  . DKA (diabetic ketoacidoses) (HCC) 06/14/2019  . Acute pancreatitis 06/14/2019  . Alcohol abuse 06/14/2019  . Essential hypertension 06/14/2019   PCP:  [facility] Pharmacy:   [facility] [phone] Ginette Otto, [place] -  [address] AT [facility] [address] [facility] [address] [place] [address] Phone: [phone] Fax: [phone]     Social Determinants of Health (SDOH)  Interventions    Readmission Risk Interventions No flowsheet data found.

## 2019-10-09 LAB — COMPREHENSIVE METABOLIC PANEL
ALT: 24 U/L (ref 0–44)
AST: 28 U/L (ref 15–41)
Albumin: 2.9 g/dL — ABNORMAL LOW (ref 3.5–5.0)
Alkaline Phosphatase: 56 U/L (ref 38–126)
Anion gap: 11 (ref 5–15)
BUN: 9 mg/dL (ref 6–20)
CO2: 23 mmol/L (ref 22–32)
Calcium: 8 mg/dL — ABNORMAL LOW (ref 8.9–10.3)
Chloride: 104 mmol/L (ref 98–111)
Creatinine, Ser: 0.52 mg/dL — ABNORMAL LOW (ref 0.61–1.24)
GFR calc Af Amer: >60 mL/min (ref >60)
GFR calc non Af Amer: >60 mL/min (ref >60)
Glucose, Bld: 92 mg/dL (ref 70–99)
Potassium: 3.9 mmol/L (ref 3.5–5.1)
Sodium: 138 mmol/L (ref 135–145)
Total Bilirubin: 0.9 mg/dL (ref 0.3–1.2)
Total Protein: 6 g/dL — ABNORMAL LOW (ref 6.5–8.1)

## 2019-10-09 LAB — GLUCOSE, CAPILLARY
Glucose-Capillary: 111 mg/dL — ABNORMAL HIGH (ref 70–99)
Glucose-Capillary: 186 mg/dL — ABNORMAL HIGH (ref 70–99)
Glucose-Capillary: 208 mg/dL — ABNORMAL HIGH (ref 70–99)
Glucose-Capillary: 72 mg/dL (ref 70–99)
Glucose-Capillary: 89 mg/dL (ref 70–99)
Glucose-Capillary: 90 mg/dL (ref 70–99)
Glucose-Capillary: 98 mg/dL (ref 70–99)

## 2019-10-09 LAB — CBC WITH DIFFERENTIAL/PLATELET
Abs Immature Granulocytes: 0.05 10*3/uL (ref 0.00–0.07)
Basophils Absolute: 0.1 10*3/uL (ref 0.0–0.1)
Basophils Relative: 1 %
Eosinophils Absolute: 0.1 10*3/uL (ref 0.0–0.5)
Eosinophils Relative: 2 %
HCT: 44.5 % (ref 39.0–52.0)
Hemoglobin: 14.4 g/dL (ref 13.0–17.0)
Immature Granulocytes: 1 %
Lymphocytes Relative: 25 %
Lymphs Abs: 1.9 10*3/uL (ref 0.7–4.0)
MCH: 31.6 pg (ref 26.0–34.0)
MCHC: 32.4 g/dL (ref 30.0–36.0)
MCV: 97.8 fL (ref 80.0–100.0)
Monocytes Absolute: 0.7 10*3/uL (ref 0.1–1.0)
Monocytes Relative: 9 %
Neutro Abs: 4.8 10*3/uL (ref 1.7–7.7)
Neutrophils Relative %: 62 %
Platelets: 157 10*3/uL (ref 150–400)
RBC: 4.55 MIL/uL (ref 4.22–5.81)
RDW: 14.2 % (ref 11.5–15.5)
WBC: 7.6 10*3/uL (ref 4.0–10.5)
nRBC: 0 % (ref 0.0–0.2)

## 2019-10-09 LAB — LIPASE, BLOOD: Lipase: 62 U/L — ABNORMAL HIGH (ref 11–51)

## 2019-10-09 MED ORDER — PANCRELIPASE (LIP-PROT-AMYL) 12000-38000 UNITS PO CPEP
36000.0000 [IU] | ORAL_CAPSULE | Freq: Three times a day (TID) | ORAL | Status: DC
  Administered 2019-10-09 – 2019-10-10 (×4): 36000 [IU] via ORAL
  Filled 2019-10-09 (×4): qty 3

## 2019-10-09 NOTE — Plan of Care (Signed)

## 2019-10-09 NOTE — Progress Notes (Addendum)
Patient states headache of around 4 in pain on 0-10 pain scale and a sort of gnawing stomach pain. Given tylenol and zofran.   Patient states stomach and headaches resolved with tylenol and zofran.   Patient bp high this AM, patient states he takes bp medicine 2x a day but PTA meds reflect coreg and losartan are once a day.

## 2019-10-09 NOTE — Progress Notes (Signed)
Inpatient Diabetes Program Recommendations  AACE/ADA: New Consensus Statement on Inpatient Glycemic Control (2015)  Target Ranges:  Prepandial:   less than 140 mg/dL      Peak postprandial:   less than 180 mg/dL (1-2 hours)      Critically ill patients:  140 - 180 mg/dL   Lab Results  Component Value Date   GLUCAP 98 10/09/2019   HGBA1C 11.9 (H) 10/08/2019    Review of Glycemic Control  Diabetes history: DM2 Outpatient Diabetes medications: Lantus 64 units QD Current orders for Inpatient glycemic control: Lantus 10 units QD, Novolog 0-9 units Q4H  HgbA1C - 11.9% CBGs 89, 98, 208, 186 mg/dL Continues on CL diet   Inpatient Diabetes Program Recommendations:     Add Novolog 3 units tidwc for meal coverage insulin when diet is advanced.  For discharge:  Lantus Solostar pen 16 units QD Novolog flexpen 4 units tidwc  Insulin pen needles  Spoke with pt about HgbA1C of 11.9%. Pt states he's only taking Lantus at home. States he has hypoglycemia when he wakes up, then blood sugars consistently high throughout the day. Discussed importance of good glucose control for healing and to reduce long and short-term complications. [facility] Gets all [facility]s at [name]. Discussed hypoglycemia s/s and treatment. Pt appreciative of visit. Has meter at home.  Thank you. Ailene Ards, RD, LDN, CDE Inpatient Diabetes Coordinator [phone]

## 2019-10-09 NOTE — Progress Notes (Signed)
PROGRESS NOTE    Ricardo Howard  QJJ:[id number] DOB: 03-26-1960 DOA: 10/07/2019 [facility]: [facility]    Chief Complaint  Patient presents with   Abdominal Pain   Constipation    Brief Narrative:  HPI per Dr. Rennis Chris Kiel Cockerell is a 59 y.o. male with medical history significant of Dm2, EtOh abuse, HTN, HLD, tobacco abuse, chronic pnacreatitis     Presented with   Abdominal pain nausea vomiting similar to prior episodes of pancreatitis Last admission for pancreatitis was in June 2021.  At that time CT scan showed pancreatic stone.  Patient has episodes of recurrent pancreatitis most likely combination of EtOH use as well as retained pancreatic duct stone. Patient was discharged on 14 June and was instructed to stop drinking he continues to drink at this time.  Drinks about 6 beers a day.  Denies any history of withdrawals DTs or seizures.  Infectious risk factors:  Reports  N/V/Diarrhea/abdominal pain,     Has  been vaccinated against COVID    Initial COVID TEST  NEGATIVE    Assessment & Plan:   Active Problems:   Acute pancreatitis   Alcohol abuse   Essential hypertension   Elevated LFTs   Mixed hyperlipidemia due to type 2 diabetes mellitus (HCC)   Uncontrolled type 2 diabetes mellitus with diabetic polyneuropathy, with long-term current use of insulin (HCC)   Pancreatitis   Dehydration   Hyperglycemia   1 acute on chronic pancreatitis Alcohol alcohol induced.  CT abdomen consistent with acute pancreatitis with no evidence of pseudocyst or pancreatic ductal dilatation.  Patient strongly advised to stop drinking alcohol.  LFTs trending down.  Lipase levels trending down.  Fasting lipid panel with a LDL of 206.  Triglycerides of 137.  Decrease IV fluids to 125 cc an hour.  Will start patient on clear liquids.  Resume Creon.  Antiemetics.  Pain management.  Supportive care.  2.  Hypertension Stable.  Continue Coreg, Cozaar.  Follow.   3.   Uncontrolled diabetes mellitus type 2 Hemoglobin A1c 11.9.  Patient with a history of medication noncompliance. CBG of 72 this morning.  Continue current regimen of Lantus 10 units daily.  Sliding scale insulin.  Monitor as patient to be started on clears.  Follow.    4.  History of alcohol abuse Alcohol cessation stressed to patient.  Patient with no signs of DTs.  Continue to CIWA Ativan withdrawal protocol, folic acid, thiamine, multivitamin.  Supportive care.  5.  Dehydration IV fluids.  6.  Hyperlipidemia Continue statin.   7.  Transaminitis Likely secondary to alcohol abuse.  LFTs have trended down.  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  HCV RNA quantitative levels pending.  Follow.   DVT prophylaxis: Lovenox Code Status: Full Family Communication: Updated patient.  No family at bedside. Disposition:   Status is: Inpatient    Dispo: The patient is from: Home              Anticipated d/c is to: Home              Anticipated d/c date is: 2-3 days              Patient currently with acute on chronic pancreatitis, which is slowly improving.  On IV fluids, pain medication.  To be started on clears today.  Not stable for discharge.         Consultants:   None  Procedures:  CT abdomen and pelvis 10/07/2019  Antimicrobials:  None   Subjective: Patient laying in bed.  Denies any nausea or emesis.  States epigastric abdominal pain improving.  No chest pain.  No shortness of breath.   Objective: Vitals:   10/08/19 1721 10/08/19 1927 10/09/19 0041 10/09/19 0422  BP: 123/84 (!) 133/91 (!) 141/96 (!) 146/96  Pulse: 83 80 79 80  Resp: 20 16 18 18   Temp: 98.1 F (36.7 C) 98.6 F (37 C) 97.8 F (36.6 C) 97.7 F (36.5 C)  TempSrc: Oral Oral Oral Oral  SpO2: 100% 97% 96% 98%  Weight:      Height:        Intake/Output Summary (Last 24 hours) at 10/09/2019 1134 Last data filed at 10/09/2019 1000 Gross per 24 hour  Intake 4775.1 ml  Output 930 ml   Net 3845.1 ml   Filed Weights   10/08/19 0005  Weight: 80.9 kg    Examination:  General exam: NAD Respiratory system: CTAB.  No wheezes, no crackles, no rhonchi.  Normal respiratory effort.   Cardiovascular system: Regular rate rhythm no murmurs rubs or gallops.  No JVD.  No lower extremity edema.   Gastrointestinal system: Abdomen is soft, decreased tenderness to palpation in the epigastrium, positive bowel sounds.  No rebound.  No guarding.   Central nervous system: Alert and oriented. No focal neurological deficits. Extremities: Symmetric 5 x 5 power. Skin: No rashes, lesions or ulcers Psychiatry: Judgement and insight appear normal. Mood & affect appropriate.     Data Reviewed: I have personally reviewed following labs and imaging studies  CBC: Recent Labs  Lab 10/06/19 1924 10/07/19 1921 10/08/19 0544 10/09/19 0455  WBC 10.1 10.3 8.1 7.6  NEUTROABS  --   --  5.9 4.8  HGB 15.6 14.9 15.5 14.4  HCT 46.8 44.5 45.9 44.5  MCV 95.5 96.7 95.4 97.8  PLT 205 173 159 157    Basic Metabolic Panel: Recent Labs  Lab 10/06/19 1924 10/07/19 1921 10/08/19 0544 10/09/19 0455  NA 133* 133* 136 138  K 5.0 4.1 3.8 3.9  CL 94* 100 103 104  CO2 24 19* 23 23  GLUCOSE 342* 204* 167* 92  BUN 11 16 12 9   CREATININE 0.70 0.66 0.56* 0.52*  CALCIUM 9.3 8.3* 8.5* 8.0*  MG  --  2.3   2.2 2.1  --   PHOS  --  3.0   3.1 2.5  --     GFR: Estimated Creatinine Clearance: 95 mL/min (A) (by C-G formula based on SCr of 0.52 mg/dL (L)).  Liver Function Tests: Recent Labs  Lab 10/06/19 1924 10/07/19 1921 10/08/19 0544 10/09/19 0455  AST 49* 34 26 28  ALT 43 33 29 24  ALKPHOS 80 68 64 56  BILITOT 1.1 1.2 0.8 0.9  PROT 8.0 7.3 7.0 6.0*  ALBUMIN 4.3 3.7 3.4* 2.9*    CBG: Recent Labs  Lab 10/08/19 2021 10/09/19 0055 10/09/19 0416 10/09/19 0725 10/09/19 1132  GLUCAP 76 90 72 89 98     Recent Results (from the past 240 hour(s))  SARS Coronavirus 2 by RT PCR (hospital  order, performed in [facility]) Nasopharyngeal Nasopharyngeal Swab     Status: None   Collection Time: 10/07/19  1:24 PM   Specimen: Nasopharyngeal Swab  Result Value Ref Range Status   SARS Coronavirus 2 NEGATIVE NEGATIVE Final    Comment: (NOTE) SARS-CoV-2 target nucleic acids are NOT DETECTED.  The SARS-CoV-2 RNA is generally detectable in upper and lower respiratory specimens during  the acute phase of infection. The lowest concentration of SARS-CoV-2 viral copies this assay can detect is 250 copies / mL. A negative result does not preclude SARS-CoV-2 infection and should not be used as the sole basis for treatment or other patient management decisions.  A negative result may occur with improper specimen collection / handling, submission of specimen other than nasopharyngeal swab, presence of viral mutation(s) within the areas targeted by this assay, and inadequate number of viral copies (<250 copies / mL). A negative result must be combined with clinical observations, patient history, and epidemiological information.  Fact Sheet for Patients:   BoilerBrush.com.cy  Fact Sheet for Healthcare Providers: https://pope.com/  This test is not yet approved or  cleared by the [place] FDA and has been authorized for detection and/or diagnosis of SARS-CoV-2 by FDA under an Emergency Use Authorization (EUA).  This EUA will remain in effect (meaning this test can be used) for the duration of the COVID-19 declaration under Section 564(b)(1) of the Act, 21 U.S.C. section 360bbb-3(b)(1), unless the authorization is terminated or revoked sooner.  Performed at [facility], [address], [place], [place] [address]          Radiology Studies: CT ABDOMEN PELVIS WO CONTRAST  Result Date: 10/07/2019 CLINICAL DATA:  Diffuse upper abdominal pain for 5 days. History of pancreatitis. EXAM: CT ABDOMEN AND  PELVIS WITHOUT CONTRAST TECHNIQUE: Multidetector CT imaging of the abdomen and pelvis was performed following the standard protocol without IV contrast. COMPARISON:  07/31/2019 CT abdomen/pelvis. FINDINGS: Lower chest: No significant pulmonary nodules or acute consolidative airspace disease. Coronary atherosclerosis. Hepatobiliary: Diffuse hepatic steatosis. Normal gallbladder with no radiopaque cholelithiasis. No biliary ductal dilatation. CBD diameter 4 mm. Pancreas: There is mild diffuse haziness of the peripancreatic fat compatible with acute pancreatitis. Chronic coarse calcifications in the pancreatic head are unchanged and indicate underlying chronic pancreatitis. No discrete pancreatic mass. No pancreatic duct dilation. Spleen: Normal size. No mass. Adrenals/Urinary Tract: Stable adrenal glands with mild lobular thickening of both adrenal glands, without discrete adrenal nodules. No hydronephrosis. No renal stones. No contour deforming renal masses. Normal bladder. Stomach/Bowel: Normal non-distended stomach. Normal caliber small bowel with no small bowel wall thickening. Normal appendix. Normal large bowel with no diverticulosis, large bowel wall thickening or pericolonic fat stranding. Vascular/Lymphatic: Atherosclerotic nonaneurysmal abdominal aorta. No pathologically enlarged lymph nodes in the abdomen or pelvis. Reproductive: Normal size prostate. Other: No pneumoperitoneum, ascites or focal fluid collection. Musculoskeletal: No aggressive appearing focal osseous lesions. Mild thoracolumbar spondylosis. IMPRESSION: 1. Noncontrast CT findings are compatible with acute on chronic pancreatitis. Cannot assess for necrotizing pancreatitis given the absence of IV contrast. No biliary ductal dilatation. No evidence of pancreatic pseudocysts. 2. Diffuse hepatic steatosis. 3. Coronary atherosclerosis. 4. Aortic Atherosclerosis (ICD10-I70.0). Electronically Signed   By: Delbert Phenix M.D.   On: 10/07/2019 14:45         Scheduled Meds:  atorvastatin  80 mg Oral QPM   buPROPion  150 mg Oral BID   carvedilol  20 mg Oral Daily   docusate sodium  100 mg Oral BID   enoxaparin (LOVENOX) injection  40 mg Subcutaneous Q24H   folic acid  1 mg Oral Daily   gabapentin  300 mg Oral BID   insulin aspart  0-9 Units Subcutaneous Q4H   insulin glargine  10 Units Subcutaneous QHS   LORazepam  0-4 mg Intravenous Q6H   Or   LORazepam  0-4 mg Oral Q6H   LORazepam  0-4  mg Intravenous Q12H   Or   LORazepam  0-4 mg Oral Q12H   losartan  50 mg Oral Daily   multivitamin with minerals  1 tablet Oral Daily   nicotine  21 mg Transdermal Daily   pantoprazole (PROTONIX) IV  40 mg Intravenous Q24H   sodium chloride flush  3 mL Intravenous Q12H   thiamine  100 mg Oral Daily   Or   thiamine  100 mg Intravenous Daily   Continuous Infusions:  sodium chloride 125 mL/hr at 10/09/19 0900     LOS: 2 days    Time spent: 35 minutes    Ramiro Harvest, MD [facility]   To contact the attending provider between 7A-7P or the covering provider during after hours 7P-7A, please log into the web site [email or web address] and access using universal [place] password for that web site. If you do not have the password, please call the hospital operator.  10/09/2019, 11:34 AM

## 2019-10-10 LAB — HCV RNA QUANT: HCV Quantitative: NOT DETECTED IU/mL (ref >50)

## 2019-10-10 LAB — BASIC METABOLIC PANEL
Anion gap: 8 (ref 5–15)
BUN: <5 mg/dL — ABNORMAL LOW (ref 6–20)
CO2: 21 mmol/L — ABNORMAL LOW (ref 22–32)
Calcium: 7.9 mg/dL — ABNORMAL LOW (ref 8.9–10.3)
Chloride: 111 mmol/L (ref 98–111)
Creatinine, Ser: 0.4 mg/dL — ABNORMAL LOW (ref 0.61–1.24)
GFR calc Af Amer: >60 mL/min (ref >60)
GFR calc non Af Amer: >60 mL/min (ref >60)
Glucose, Bld: 103 mg/dL — ABNORMAL HIGH (ref 70–99)
Potassium: 3.2 mmol/L — ABNORMAL LOW (ref 3.5–5.1)
Sodium: 140 mmol/L (ref 135–145)

## 2019-10-10 LAB — GLUCOSE, CAPILLARY
Glucose-Capillary: 91 mg/dL (ref 70–99)
Glucose-Capillary: 107 mg/dL — ABNORMAL HIGH (ref 70–99)
Glucose-Capillary: 255 mg/dL — ABNORMAL HIGH (ref 70–99)
Glucose-Capillary: 272 mg/dL — ABNORMAL HIGH (ref 70–99)

## 2019-10-10 MED ORDER — THIAMINE HCL 100 MG PO TABS
100.0000 mg | ORAL_TABLET | Freq: Every day | ORAL | Status: DC
Start: 2019-10-11 — End: 2023-02-13

## 2019-10-10 MED ORDER — POTASSIUM CHLORIDE CRYS ER 20 MEQ PO TBCR
40.0000 meq | EXTENDED_RELEASE_TABLET | ORAL | Status: AC
  Administered 2019-10-10 (×2): 40 meq via ORAL
  Filled 2019-10-10 (×2): qty 2

## 2019-10-10 MED ORDER — FOLIC ACID 1 MG PO TABS: 1.0000 mg | ORAL_TABLET | Freq: Every day | ORAL | Status: AC

## 2019-10-10 MED ORDER — INSULIN ASPART 100 UNIT/ML FLEXPEN: 4.0000 [IU] | PEN_INJECTOR | Freq: Three times a day (TID) | SUBCUTANEOUS | 0 refills | Status: DC

## 2019-10-10 MED ORDER — INSULIN GLARGINE 100 UNIT/ML SOLOSTAR PEN: 16.0000 [IU] | PEN_INJECTOR | Freq: Every day | SUBCUTANEOUS | 0 refills | Status: DC

## 2019-10-10 MED ORDER — INSULIN PEN NEEDLE 30G X 8 MM MISC: 1.0000 | 0 refills | Status: AC | PRN

## 2019-10-10 NOTE — Discharge Summary (Signed)
Physician Discharge Summary  Ricardo Howard JKK:[id number] DOB: 06-Feb-1961 DOA: 10/07/2019  [facility]P: [facility]  Admit date: 10/07/2019 Discharge date: 10/10/2019  Time spent: 50 minutes  Recommendations for Outpatient Follow-up:  1. Follow-up with [facility] in 2 weeks.  On follow-up patient's diabetes will need to be reassessed as patient's regimen was changed to Lantus Solostar pen 16 units daily, NovoLog FlexPen 4 units 3 times daily.  Patient will need a basic metabolic profile done to follow-up on electrolytes and renal function.   Discharge Diagnoses:  Active Problems:   Acute pancreatitis   Alcohol abuse   Essential hypertension   Elevated LFTs   Mixed hyperlipidemia due to type 2 diabetes mellitus (HCC)   Uncontrolled type 2 diabetes mellitus with diabetic polyneuropathy, with long-term current use of insulin (HCC)   Pancreatitis   Dehydration   Hyperglycemia   Discharge Condition: Stable and improved  Diet recommendation: Heart healthy/carb modified diet  Filed Weights   10/08/19 0005  Weight: 80.9 kg    History of present illness:  HPI per Dr. Ramond Marrow a 59 y.o.malewith medical history significant of Dm2, EtOh abuse, HTN, HLD, tobacco abuse, chronic pnacreatitis   Presented withAbdominal painnausea vomiting similar to prior episodes of pancreatitis Last admission for pancreatitis was in June 2021. At that time CT scan showed pancreatic stone. Patient has episodes of recurrent pancreatitis most likely combination of EtOH use as well as retained pancreatic duct stone. Patient was discharged on 14 June and was instructed to stop drinking he continues to drink at this time. Drinks about 6 beers a day. Denies any history of withdrawals DTs or seizures.  Infectious risk factors: Reports N/V/Diarrhea/abdominal pain,    Has been vaccinated against COVID   Initial COVID TEST  NEGATIVE  Hospital  Course:  1 acute on chronic pancreatitis Alcohol alcohol induced.  CT abdomen consistent with acute pancreatitis with no evidence of pseudocyst or pancreatic ductal dilatation.  Patient strongly advised to stop drinking alcohol.  Patient was aggressively hydrated with IV fluids, placed on bowel rest.  Fasting lipid panel done with LDL of 206, triglycerides of 137.  Patient improved clinically.  LFTs trended down.  Lipase levels trended down.  Patient subsequently started on a clear liquid diet which he tolerated.  Diet advanced to a full liquid diet and subsequently a soft diet which he tolerated.  Patient's Creon was resumed once patient was placed on a diet.  Patient improved clinically will be discharged home in stable and improved condition to follow-up with PCP in the outpatient setting.   2.  Hypertension Patient was maintained on home regimen Coreg and Cozaar.    3.  Uncontrolled diabetes mellitus type 2 Hemoglobin A1c 11.9.  Patient with a history of medication noncompliance. Patient maintained on Lantus 10 units daily during the hospitalization as well as sliding scale insulin.  Patient subsequently started on clears and diet advanced to full liquid diet and subsequently soft diet which he tolerated.  Patient will be discharged on Lantus Solostar pen 16 units daily as well as NovoLog FlexPen 4 units 3 times daily with meals.  Outpatient follow-up with PCP.    4.  History of alcohol abuse Alcohol cessation stressed to patient.  Patient with no signs of DTs.    Patient maintained on the CIWA Ativan withdrawal protocol, folic acid, thiamine, multivitamin.  Outpatient follow-up.    5.  Dehydration Hydrated with IV fluids and was euvolemic by day of discharge.  6.  Hyperlipidemia Patient maintained on home regimen statin.   7.  Transaminitis Likely secondary to alcohol abuse.  LFTs have trended down.  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  HCV RNA quantitative levels  pending and will need to be followed up on discharge with PCP.  Follow.   Procedures:  CT abdomen and pelvis 10/07/2019    Consultations:  None  Discharge Exam: Vitals:   10/10/19 0822 10/10/19 1412  BP: (!) 173/110 (!) 161/98  Pulse: 81 87  Resp:  18  Temp:  98.1 F (36.7 C)  SpO2:  98%    General: NAD Cardiovascular: RRR Respiratory: CTAB  Discharge Instructions   Discharge Instructions    Diet - low sodium heart healthy   Complete by: As directed    Diet Carb Modified   Complete by: As directed    Increase activity slowly   Complete by: As directed      Allergies as of 10/10/2019      Reactions   Iohexol Hives   Patient broke out in hives after injection of Omni 300, will need 13 hour pre-med in future   Lisinopril Cough   Simvastatin Itching, Nausea Only      Medication List    STOP taking these medications   insulin aspart 100 UNIT/ML injection Commonly known as: novoLOG Replaced by: insulin aspart 100 UNIT/ML FlexPen     TAKE these medications   aspirin 81 MG tablet Take 81 mg by mouth daily.   atorvastatin 80 MG tablet Commonly known as: LIPITOR Take 80 mg by mouth every evening.   buPROPion 150 MG 12 hr tablet Commonly known as: WELLBUTRIN SR Take 150 mg by mouth in the morning and at bedtime.   carvedilol 20 MG 24 hr capsule Commonly known as: COREG CR Take 20 mg by mouth daily.   folic acid 1 MG tablet Commonly known as: FOLVITE Take 1 tablet (1 mg total) by mouth daily. Start taking on: October 11, 2019   gabapentin 300 MG capsule Commonly known as: NEURONTIN Take 300 mg by mouth in the morning and at bedtime.   insulin aspart 100 UNIT/ML FlexPen Commonly known as: NOVOLOG Inject 4 Units into the skin 3 (three) times daily with meals. Replaces: insulin aspart 100 UNIT/ML injection   insulin glargine 100 UNIT/ML Solostar Pen Commonly known as: LANTUS Inject 16 Units into the skin daily.   Insulin Pen Needle 30G X 8 MM  Misc Commonly known as: NOVOFINE Inject 10 each into the skin as needed.   lipase/protease/amylase 50354 UNITS Cpep capsule Commonly known as: CREON Take 1 capsule (36,000 Units total) by mouth 3 (three) times daily before meals.   losartan 50 MG tablet Commonly known as: COZAAR Take 1 tablet (50 mg total) by mouth daily.   pantoprazole 40 MG tablet Commonly known as: PROTONIX Take 1 tablet (40 mg total) by mouth daily.   potassium chloride SA 20 MEQ tablet Commonly known as: KLOR-CON Take 1 tablet (20 mEq total) by mouth 2 (two) times daily for 3 days.   Quintabs Tabs Take 1 tablet by mouth daily.   thiamine 100 MG tablet Take 1 tablet (100 mg total) by mouth daily. Start taking on: October 11, 2019   zolpidem 10 MG tablet Commonly known as: AMBIEN Take 10 mg by mouth at bedtime.      Allergies  Allergen Reactions  . Iohexol Hives    Patient broke out in hives after injection of Omni 300, will need  13 hour pre-med in future  . Lisinopril Cough  . Simvastatin Itching and Nausea Only    Follow-up Information    [facility] Schedule an appointment as soon as possible for a visit in 2 week(s).   Contact information: [address] [facility] [address] [place] [id number] [phone]                The results of significant diagnostics from this hospitalization (including imaging, microbiology, ancillary and laboratory) are listed below for reference.    Significant Diagnostic Studies: CT ABDOMEN PELVIS WO CONTRAST  Result Date: 10/07/2019 CLINICAL DATA:  Diffuse upper abdominal pain for 5 days. History of pancreatitis. EXAM: CT ABDOMEN AND PELVIS WITHOUT CONTRAST TECHNIQUE: Multidetector CT imaging of the abdomen and pelvis was performed following the standard protocol without IV contrast. COMPARISON:  07/31/2019 CT abdomen/pelvis. FINDINGS: Lower chest: No significant pulmonary nodules or acute consolidative airspace disease. Coronary  atherosclerosis. Hepatobiliary: Diffuse hepatic steatosis. Normal gallbladder with no radiopaque cholelithiasis. No biliary ductal dilatation. CBD diameter 4 mm. Pancreas: There is mild diffuse haziness of the peripancreatic fat compatible with acute pancreatitis. Chronic coarse calcifications in the pancreatic head are unchanged and indicate underlying chronic pancreatitis. No discrete pancreatic mass. No pancreatic duct dilation. Spleen: Normal size. No mass. Adrenals/Urinary Tract: Stable adrenal glands with mild lobular thickening of both adrenal glands, without discrete adrenal nodules. No hydronephrosis. No renal stones. No contour deforming renal masses. Normal bladder. Stomach/Bowel: Normal non-distended stomach. Normal caliber small bowel with no small bowel wall thickening. Normal appendix. Normal large bowel with no diverticulosis, large bowel wall thickening or pericolonic fat stranding. Vascular/Lymphatic: Atherosclerotic nonaneurysmal abdominal aorta. No pathologically enlarged lymph nodes in the abdomen or pelvis. Reproductive: Normal size prostate. Other: No pneumoperitoneum, ascites or focal fluid collection. Musculoskeletal: No aggressive appearing focal osseous lesions. Mild thoracolumbar spondylosis. IMPRESSION: 1. Noncontrast CT findings are compatible with acute on chronic pancreatitis. Cannot assess for necrotizing pancreatitis given the absence of IV contrast. No biliary ductal dilatation. No evidence of pancreatic pseudocysts. 2. Diffuse hepatic steatosis. 3. Coronary atherosclerosis. 4. Aortic Atherosclerosis (ICD10-I70.0). Electronically Signed   By: Delbert PhenixJason A Poff M.D.   On: 10/07/2019 14:45    Microbiology: Recent Results (from the past 240 hour(s))  SARS Coronavirus 2 by RT PCR (hospital order, performed in [facility] lab) Nasopharyngeal Nasopharyngeal Swab     Status: None   Collection Time: 10/07/19  1:24 PM   Specimen: Nasopharyngeal Swab  Result Value Ref Range  Status   SARS Coronavirus 2 NEGATIVE NEGATIVE Final    Comment: (NOTE) SARS-CoV-2 target nucleic acids are NOT DETECTED.  The SARS-CoV-2 RNA is generally detectable in upper and lower respiratory specimens during the acute phase of infection. The lowest concentration of SARS-CoV-2 viral copies this assay can detect is 250 copies / mL. A negative result does not preclude SARS-CoV-2 infection and should not be used as the sole basis for treatment or other patient management decisions.  A negative result may occur with improper specimen collection / handling, submission of specimen other than nasopharyngeal swab, presence of viral mutation(s) within the areas targeted by this assay, and inadequate number of viral copies (<250 copies / mL). A negative result must be combined with clinical observations, patient history, and epidemiological information.  Fact Sheet for Patients:   BoilerBrush.com.cyhttps://www.fda.gov/media/136312/download  Fact Sheet for Healthcare Providers: https://pope.com/https://www.fda.gov/media/136313/download  This test is not yet approved or  cleared by the [place] FDA and has been authorized for detection and/or diagnosis of SARS-CoV-2 by  FDA under an Emergency Use Authorization (EUA).  This EUA will remain in effect (meaning this test can be used) for the duration of the COVID-19 declaration under Section 564(b)(1) of the Act, 21 U.S.C. section 360bbb-3(b)(1), unless the authorization is terminated or revoked sooner.  Performed at [facility], [address] [place], [place] [address]      Labs: Basic Metabolic Panel: Recent Labs  Lab 10/06/19 1924 10/07/19 1921 10/08/19 0544 10/09/19 0455 10/10/19 0620  NA 133* 133* 136 138 140  K 5.0 4.1 3.8 3.9 3.2*  CL 94* 100 103 104 111  CO2 24 19* 23 23 21*  GLUCOSE 342* 204* 167* 92 103*  BUN 11 16 12 9  <5*  CREATININE 0.70 0.66 0.56* 0.52* 0.40*  CALCIUM 9.3 8.3* 8.5* 8.0* 7.9*  MG  --  2.3  2.2 2.1  --    --   PHOS  --  3.0  3.1 2.5  --   --    Liver Function Tests: Recent Labs  Lab 10/06/19 1924 10/07/19 1921 10/08/19 0544 10/09/19 0455  AST 49* 34 26 28  ALT 43 33 29 24  ALKPHOS 80 68 64 56  BILITOT 1.1 1.2 0.8 0.9  PROT 8.0 7.3 7.0 6.0*  ALBUMIN 4.3 3.7 3.4* 2.9*   Recent Labs  Lab 10/06/19 1924 10/08/19 0544 10/09/19 0455  LIPASE 640* 123* 62*   No results for input(s): AMMONIA in the last 168 hours. CBC: Recent Labs  Lab 10/06/19 1924 10/07/19 1921 10/08/19 0544 10/09/19 0455  WBC 10.1 10.3 8.1 7.6  NEUTROABS  --   --  5.9 4.8  HGB 15.6 14.9 15.5 14.4  HCT 46.8 44.5 45.9 44.5  MCV 95.5 96.7 95.4 97.8  PLT 205 173 159 157   Cardiac Enzymes: Recent Labs  Lab 10/07/19 1921  CKTOTAL 628*   BNP: BNP (last 3 results) No results for input(s): BNP in the last 8760 hours.  ProBNP (last 3 results) No results for input(s): PROBNP in the last 8760 hours.  CBG: Recent Labs  Lab 10/09/19 2350 10/10/19 0407 10/10/19 0748 10/10/19 1157 10/10/19 1601  GLUCAP 111* 91 107* 255* 272*       Signed:  10/12/19 MD.  [facility] 10/10/2019, 7:14 PM

## 2020-03-12 ENCOUNTER — Inpatient Hospital Stay (HOSPITAL_COMMUNITY)
Admission: EM | Admit: 2020-03-12 | Discharge: 2020-03-19 | DRG: 240 | Disposition: A | Discharge status: Rehabilitation Facility | Payer: No Typology Code available for payment source | Attending: Internal Medicine | Admitting: Internal Medicine

## 2020-03-12 ENCOUNTER — Emergency Department (HOSPITAL_COMMUNITY): Payer: No Typology Code available for payment source

## 2020-03-12 ENCOUNTER — Other Ambulatory Visit: Payer: Self-pay

## 2020-03-12 ENCOUNTER — Encounter (HOSPITAL_COMMUNITY): Payer: Self-pay | Admitting: *Deleted

## 2020-03-12 DIAGNOSIS — B952 Enterococcus as the cause of diseases classified elsewhere: Secondary | ICD-10-CM | POA: Diagnosis present

## 2020-03-12 DIAGNOSIS — F1010 Alcohol abuse, uncomplicated: Secondary | ICD-10-CM | POA: Diagnosis present

## 2020-03-12 DIAGNOSIS — Z794 Long term (current) use of insulin: Secondary | ICD-10-CM | POA: Diagnosis not present

## 2020-03-12 DIAGNOSIS — Z87820 Personal history of traumatic brain injury: Secondary | ICD-10-CM | POA: Diagnosis not present

## 2020-03-12 DIAGNOSIS — Z888 Allergy status to other drugs, medicaments and biological substances status: Secondary | ICD-10-CM | POA: Diagnosis not present

## 2020-03-12 DIAGNOSIS — E1169 Type 2 diabetes mellitus with other specified complication: Secondary | ICD-10-CM | POA: Diagnosis present

## 2020-03-12 DIAGNOSIS — E782 Mixed hyperlipidemia: Secondary | ICD-10-CM | POA: Diagnosis present

## 2020-03-12 DIAGNOSIS — IMO0002 Reserved for concepts with insufficient information to code with codable children: Secondary | ICD-10-CM

## 2020-03-12 DIAGNOSIS — I96 Gangrene, not elsewhere classified: Secondary | ICD-10-CM

## 2020-03-12 DIAGNOSIS — F419 Anxiety disorder, unspecified: Secondary | ICD-10-CM | POA: Diagnosis present

## 2020-03-12 DIAGNOSIS — Z955 Presence of coronary angioplasty implant and graft: Secondary | ICD-10-CM

## 2020-03-12 DIAGNOSIS — Z79899 Other long term (current) drug therapy: Secondary | ICD-10-CM

## 2020-03-12 DIAGNOSIS — E1140 Type 2 diabetes mellitus with diabetic neuropathy, unspecified: Secondary | ICD-10-CM

## 2020-03-12 DIAGNOSIS — E1165 Type 2 diabetes mellitus with hyperglycemia: Secondary | ICD-10-CM | POA: Diagnosis present

## 2020-03-12 DIAGNOSIS — K861 Other chronic pancreatitis: Secondary | ICD-10-CM | POA: Diagnosis not present

## 2020-03-12 DIAGNOSIS — I2510 Atherosclerotic heart disease of native coronary artery without angina pectoris: Secondary | ICD-10-CM | POA: Diagnosis present

## 2020-03-12 DIAGNOSIS — F32A Depression, unspecified: Secondary | ICD-10-CM | POA: Diagnosis present

## 2020-03-12 DIAGNOSIS — Z9119 Patient's noncompliance with other medical treatment and regimen: Secondary | ICD-10-CM

## 2020-03-12 DIAGNOSIS — I739 Peripheral vascular disease, unspecified: Secondary | ICD-10-CM | POA: Diagnosis not present

## 2020-03-12 DIAGNOSIS — L089 Local infection of the skin and subcutaneous tissue, unspecified: Secondary | ICD-10-CM | POA: Diagnosis not present

## 2020-03-12 DIAGNOSIS — I10 Essential (primary) hypertension: Secondary | ICD-10-CM | POA: Diagnosis not present

## 2020-03-12 DIAGNOSIS — E1152 Type 2 diabetes mellitus with diabetic peripheral angiopathy with gangrene: Principal | ICD-10-CM | POA: Diagnosis present

## 2020-03-12 DIAGNOSIS — Z7982 Long term (current) use of aspirin: Secondary | ICD-10-CM

## 2020-03-12 DIAGNOSIS — E1142 Type 2 diabetes mellitus with diabetic polyneuropathy: Secondary | ICD-10-CM | POA: Diagnosis present

## 2020-03-12 DIAGNOSIS — F17210 Nicotine dependence, cigarettes, uncomplicated: Secondary | ICD-10-CM | POA: Diagnosis present

## 2020-03-12 DIAGNOSIS — M868X7 Other osteomyelitis, ankle and foot: Secondary | ICD-10-CM | POA: Diagnosis not present

## 2020-03-12 DIAGNOSIS — Z20822 Contact with and (suspected) exposure to covid-19: Secondary | ICD-10-CM | POA: Diagnosis present

## 2020-03-12 DIAGNOSIS — I70261 Atherosclerosis of native arteries of extremities with gangrene, right leg: Secondary | ICD-10-CM | POA: Diagnosis not present

## 2020-03-12 LAB — BASIC METABOLIC PANEL
Anion gap: 13 (ref 5–15)
BUN: 11 mg/dL (ref 6–20)
CO2: 22 mmol/L (ref 22–32)
Calcium: 9.2 mg/dL (ref 8.9–10.3)
Chloride: 100 mmol/L (ref 98–111)
Creatinine, Ser: 0.74 mg/dL (ref 0.61–1.24)
GFR, Estimated: >60 mL/min (ref >60)
Glucose, Bld: 433 mg/dL — ABNORMAL HIGH (ref 70–99)
Potassium: 4.7 mmol/L (ref 3.5–5.1)
Sodium: 135 mmol/L (ref 135–145)

## 2020-03-12 LAB — CBC WITH DIFFERENTIAL/PLATELET
Abs Immature Granulocytes: 0.3 10*3/uL — ABNORMAL HIGH (ref 0.00–0.07)
Basophils Absolute: 0 10*3/uL (ref 0.0–0.1)
Basophils Relative: 0 %
Eosinophils Absolute: 0.1 10*3/uL (ref 0.0–0.5)
Eosinophils Relative: 0 %
HCT: 38.8 % — ABNORMAL LOW (ref 39.0–52.0)
Hemoglobin: 12.6 g/dL — ABNORMAL LOW (ref 13.0–17.0)
Immature Granulocytes: 2 %
Lymphocytes Relative: 5 %
Lymphs Abs: 1.1 10*3/uL (ref 0.7–4.0)
MCH: 30.5 pg (ref 26.0–34.0)
MCHC: 32.5 g/dL (ref 30.0–36.0)
MCV: 93.9 fL (ref 80.0–100.0)
Monocytes Absolute: 1.2 10*3/uL — ABNORMAL HIGH (ref 0.1–1.0)
Monocytes Relative: 6 %
Neutro Abs: 17.4 10*3/uL — ABNORMAL HIGH (ref 1.7–7.7)
Neutrophils Relative %: 87 %
Platelets: 287 10*3/uL (ref 150–400)
RBC: 4.13 MIL/uL — ABNORMAL LOW (ref 4.22–5.81)
RDW: 13.5 % (ref 11.5–15.5)
WBC: 20.1 10*3/uL — ABNORMAL HIGH (ref 4.0–10.5)
nRBC: 0 % (ref 0.0–0.2)

## 2020-03-12 LAB — LACTIC ACID, PLASMA
Lactic Acid, Venous: 2.8 mmol/L (ref 0.5–1.9)
Lactic Acid, Venous: 1.5 mmol/L (ref 0.5–1.9)

## 2020-03-12 LAB — GLUCOSE, CAPILLARY: Glucose-Capillary: 301 mg/dL — ABNORMAL HIGH (ref 70–99)

## 2020-03-12 LAB — SARS CORONAVIRUS 2 (TAT 6-24 HRS): SARS Coronavirus 2: NEGATIVE

## 2020-03-12 LAB — HEMOGLOBIN A1C
Hgb A1c MFr Bld: 11.4 % — ABNORMAL HIGH (ref 4.8–5.6)
Mean Plasma Glucose: 280.48 mg/dL

## 2020-03-12 LAB — CBG MONITORING, ED
Glucose-Capillary: 374 mg/dL — ABNORMAL HIGH (ref 70–99)
Glucose-Capillary: 268 mg/dL — ABNORMAL HIGH (ref 70–99)
Glucose-Capillary: 248 mg/dL — ABNORMAL HIGH (ref 70–99)

## 2020-03-12 LAB — SURGICAL PCR SCREEN
MRSA, PCR: NEGATIVE
Staphylococcus aureus: NEGATIVE

## 2020-03-12 MED ORDER — CARVEDILOL PHOSPHATE ER 20 MG PO CP24
20.0000 mg | ORAL_CAPSULE | Freq: Every day | ORAL | Status: DC
Start: 2020-03-13 — End: 2020-03-19
  Administered 2020-03-13 – 2020-03-19 (×7): 20 mg via ORAL
  Filled 2020-03-12 (×7): qty 1

## 2020-03-12 MED ORDER — GABAPENTIN 300 MG PO CAPS
300.0000 mg | ORAL_CAPSULE | Freq: Two times a day (BID) | ORAL | Status: DC
  Administered 2020-03-12 – 2020-03-19 (×14): 300 mg via ORAL
  Filled 2020-03-12 (×15): qty 1

## 2020-03-12 MED ORDER — LACTATED RINGERS IV BOLUS
1000.0000 mL | Freq: Once | INTRAVENOUS | Status: AC
  Administered 2020-03-12: 1000 mL via INTRAVENOUS

## 2020-03-12 MED ORDER — PIPERACILLIN-TAZOBACTAM 3.375 G IVPB
3.3750 g | Freq: Three times a day (TID) | INTRAVENOUS | Status: AC
  Administered 2020-03-12 – 2020-03-18 (×19): 3.375 g via INTRAVENOUS
  Filled 2020-03-12 (×18): qty 50

## 2020-03-12 MED ORDER — SODIUM CHLORIDE 0.9 % IV BOLUS
500.0000 mL | Freq: Once | INTRAVENOUS | Status: AC
  Administered 2020-03-12: 500 mL via INTRAVENOUS

## 2020-03-12 MED ORDER — ZOLPIDEM TARTRATE 5 MG PO TABS: 10.0000 mg | ORAL_TABLET | Freq: Every evening | ORAL | Status: DC | PRN

## 2020-03-12 MED ORDER — INSULIN GLARGINE 100 UNIT/ML SOLOSTAR PEN: 8.0000 [IU] | PEN_INJECTOR | Freq: Every day | SUBCUTANEOUS | Status: DC

## 2020-03-12 MED ORDER — LACTATED RINGERS IV BOLUS
1000.0000 mL | Freq: Once | INTRAVENOUS | Status: AC
  Administered 2020-03-15: 1000 mL via INTRAVENOUS

## 2020-03-12 MED ORDER — INSULIN GLARGINE 100 UNIT/ML ~~LOC~~ SOLN
8.0000 [IU] | Freq: Every day | SUBCUTANEOUS | Status: DC
  Administered 2020-03-12 – 2020-03-16 (×5): 8 [IU] via SUBCUTANEOUS
  Filled 2020-03-12 (×5): qty 0.08

## 2020-03-12 MED ORDER — PANTOPRAZOLE SODIUM 40 MG PO TBEC
40.0000 mg | DELAYED_RELEASE_TABLET | Freq: Every day | ORAL | Status: DC
  Administered 2020-03-12 – 2020-03-19 (×8): 40 mg via ORAL
  Filled 2020-03-12 (×8): qty 1

## 2020-03-12 MED ORDER — SODIUM CHLORIDE 0.9 % IV SOLN: INTRAVENOUS | Status: DC

## 2020-03-12 MED ORDER — ATORVASTATIN CALCIUM 80 MG PO TABS
80.0000 mg | ORAL_TABLET | Freq: Every day | ORAL | Status: DC
  Administered 2020-03-12 – 2020-03-19 (×8): 80 mg via ORAL
  Filled 2020-03-12 (×8): qty 1

## 2020-03-12 MED ORDER — INSULIN ASPART 100 UNIT/ML ~~LOC~~ SOLN
14.0000 [IU] | Freq: Once | SUBCUTANEOUS | Status: AC
  Administered 2020-03-12: 14 [IU] via INTRAVENOUS

## 2020-03-12 MED ORDER — POLYETHYLENE GLYCOL 3350 17 G PO PACK: 17.0000 g | PACK | Freq: Every day | ORAL | Status: DC | PRN

## 2020-03-12 MED ORDER — BISACODYL 10 MG RE SUPP: 10.0000 mg | Freq: Every day | RECTAL | Status: DC | PRN

## 2020-03-12 MED ORDER — PANCRELIPASE (LIP-PROT-AMYL) 36000-114000 UNITS PO CPEP
36000.0000 [IU] | ORAL_CAPSULE | Freq: Three times a day (TID) | ORAL | Status: DC
  Administered 2020-03-13 – 2020-03-19 (×16): 36000 [IU] via ORAL
  Filled 2020-03-12 (×20): qty 1

## 2020-03-12 MED ORDER — VANCOMYCIN HCL 1750 MG/350ML IV SOLN
1750.0000 mg | Freq: Once | INTRAVENOUS | Status: AC
  Administered 2020-03-12: 1750 mg via INTRAVENOUS
  Filled 2020-03-12 (×2): qty 350

## 2020-03-12 MED ORDER — ACETAMINOPHEN 650 MG RE SUPP: 650.0000 mg | Freq: Four times a day (QID) | RECTAL | Status: DC | PRN

## 2020-03-12 MED ORDER — VANCOMYCIN HCL 1250 MG/250ML IV SOLN
1250.0000 mg | Freq: Two times a day (BID) | INTRAVENOUS | Status: DC
  Administered 2020-03-13 – 2020-03-16 (×7): 1250 mg via INTRAVENOUS
  Filled 2020-03-12 (×9): qty 250

## 2020-03-12 MED ORDER — LOSARTAN POTASSIUM 50 MG PO TABS
50.0000 mg | ORAL_TABLET | Freq: Every day | ORAL | Status: DC
  Administered 2020-03-13 – 2020-03-17 (×5): 50 mg via ORAL
  Filled 2020-03-12 (×6): qty 1

## 2020-03-12 MED ORDER — ACETAMINOPHEN 325 MG PO TABS
650.0000 mg | ORAL_TABLET | Freq: Four times a day (QID) | ORAL | Status: DC | PRN
  Administered 2020-03-12 – 2020-03-16 (×3): 650 mg via ORAL
  Filled 2020-03-12 (×4): qty 2

## 2020-03-12 MED ORDER — SODIUM CHLORIDE 0.9 % IV SOLN
2.0000 g | Freq: Once | INTRAVENOUS | Status: AC
  Administered 2020-03-12: 2 g via INTRAVENOUS
  Filled 2020-03-12: qty 20

## 2020-03-12 MED ORDER — BUPROPION HCL ER (SR) 150 MG PO TB12
150.0000 mg | ORAL_TABLET | Freq: Two times a day (BID) | ORAL | Status: DC
  Administered 2020-03-12 – 2020-03-19 (×14): 150 mg via ORAL
  Filled 2020-03-12 (×16): qty 1

## 2020-03-12 MED ORDER — INSULIN ASPART 100 UNIT/ML ~~LOC~~ SOLN
0.0000 [IU] | Freq: Three times a day (TID) | SUBCUTANEOUS | Status: DC
  Administered 2020-03-12: 18:00:00 5 [IU] via SUBCUTANEOUS
  Administered 2020-03-13: 11 [IU] via SUBCUTANEOUS
  Administered 2020-03-13: 5 [IU] via SUBCUTANEOUS
  Administered 2020-03-13 – 2020-03-14 (×2): 3 [IU] via SUBCUTANEOUS
  Administered 2020-03-14: 8 [IU] via SUBCUTANEOUS
  Administered 2020-03-14: 5 [IU] via SUBCUTANEOUS
  Administered 2020-03-15 (×2): 3 [IU] via SUBCUTANEOUS
  Administered 2020-03-15: 8 [IU] via SUBCUTANEOUS
  Administered 2020-03-16: 5 [IU] via SUBCUTANEOUS
  Administered 2020-03-16 (×2): 3 [IU] via SUBCUTANEOUS
  Administered 2020-03-17: 2 [IU] via SUBCUTANEOUS
  Administered 2020-03-18: 8 [IU] via SUBCUTANEOUS
  Administered 2020-03-19: 5 [IU] via SUBCUTANEOUS

## 2020-03-12 MED ORDER — OXYCODONE HCL 5 MG PO TABS
5.0000 mg | ORAL_TABLET | ORAL | Status: DC | PRN
  Administered 2020-03-12 – 2020-03-13 (×2): 5 mg via ORAL
  Filled 2020-03-12 (×2): qty 1

## 2020-03-12 MED ORDER — MORPHINE SULFATE (PF) 4 MG/ML IV SOLN
4.0000 mg | Freq: Once | INTRAVENOUS | Status: AC
  Administered 2020-03-12: 4 mg via INTRAVENOUS
  Filled 2020-03-12: qty 1

## 2020-03-12 NOTE — H&P (View-Only) (Signed)
VASCULAR SURGERY ADDENDUM:  I did find his operative report from his angioplasty that was done in [place] on 02/25/2020.  I am unable to review the images but according to the note the patient had nonflow limiting disease in the common iliac artery, external iliac artery, and superficial femoral artery.  There was then an occlusion of the distal superficial femoral artery which was successfully addressed with angioplasty.  Below that there was disease in the popliteal artery at the level of the knee.  The tibial vessels were open with dominant runoff via the posterior tibial artery.  The fact that this wound did not improve despite revascularization certainly is discouraging and he is at high risk for below the knee amputation.  I reviewed his Doppler studies that were done today.  On the right side he had a dampened and monophasic dorsalis pedis and posterior tibial signal.  ABI was 62%.  Given the extensive wound on the right foot with a white count of 20,000 and extensive air throughout the soft tissue involving the right foot I have recommended open transmetatarsal amputation tomorrow to help control the infection.  He is currently receiving intravenous vancomycin and Zosyn.  I have discussed this with the patient and he is agreeable to proceed tomorrow.  Christopher Dickson, MD Office: [phone]    

## 2020-03-12 NOTE — H&P (Signed)
History and Physical:    Ricardo Howard   PIR:[id number] DOB: [birth year] DOA: 03/12/2020  Referring MD/provider: PA Harlene Salts PCP: [facility]   Patient coming from: Home  Chief Complaint: Gangrene and right foot  History of Present Illness:   Ricardo Howard is an 60 y.o. male with HTN, uncontrolled DM2 severe PVD with ongoing tobacco use, ongoing alcohol use and chronic pancreatitis  who was seen at [facility] last week for gangrene in his right foot.  Patient apparently underwent a stent and was discharged home to follow-up as an outpatient.  Patient now presents today complaining of increasing pain in his right foot.  Patient denies fevers and chills.  Notes that his leg has been looking this bad for a while.  Denies any recent falls.  No shortness of breath.  No cough.  Patient states that he drinks 6 beers a day and smokes 5 cigarettes a day.  Notes he has never had any problems when he stopped drinking, has no history of complicated withdrawal.  ED Course:  The patient was noted to have wet gangrene in his second right toe.  Vascular surgery was called and Dr. Durwin Nora evaluated the patient and plan is for likely a TMA in the morning.  Patient was placed on ceftriaxone and vancomycin.  Patient was also noted to be hyperglycemic and he was given 10 units of aspart.  ROS:   ROS   Review of Systems: General: Denies fever, chills, malaise,  Respiratory: Denies cough, SOB at rest or hemoptysis Cardiovascular: Denies chest pain or palpitations GI: Denies nausea, vomiting, diarrhea or constipation GU: Denies dysuria, frequency or hematuria   Past Medical History:   Past Medical History:  Diagnosis Date  . Alcohol abuse 06/14/2019  . Diabetes mellitus without complication (HCC)   . Essential hypertension 06/14/2019  . Mixed hyperlipidemia due to type 2 diabetes mellitus (HCC) 07/31/2019  . Nicotine dependence, cigarettes, uncomplicated 07/31/2019  .  Pancreatitis   . Uncontrolled type 2 diabetes mellitus with diabetic polyneuropathy, with long-term current use of insulin (HCC) 07/31/2019    Past Surgical History:   Past Surgical History:  Procedure Laterality Date  . CORONARY ANGIOPLASTY WITH STENT PLACEMENT      Social History:   Social History   Socioeconomic History  . Marital status: Married    Spouse name: Not on file  . Number of children: Not on file  . Years of education: Not on file  . Highest education level: Not on file  Occupational History  . Not on file  Tobacco Use  . Smoking status: Current Every Day Smoker    Packs/day: 0.25    Types: Cigarettes  . Smokeless tobacco: Never Used  Vaping Use  . Vaping Use: Never used  Substance and Sexual Activity  . Alcohol use: Yes  . Drug use: Not Currently  . Sexual activity: Not on file  Other Topics Concern  . Not on file  Social History Narrative  . Not on file   Social Determinants of Health   Financial Resource Strain: Not on file  Food Insecurity: Not on file  Transportation Needs: Not on file  Physical Activity: Not on file  Stress: Not on file  Social Connections: Not on file  Intimate Partner Violence: Not on file    Allergies   Iohexol, Lisinopril, and Simvastatin  Family history:   Family History  Problem Relation Age of Onset  . Other Neg Hx  Current Medications:   Prior to Admission medications   Medication Sig Start Date End Date Taking? Authorizing Provider  aspirin 81 MG tablet Take 81 mg by mouth daily.    [provider]  atorvastatin (LIPITOR) 80 MG tablet Take 80 mg by mouth every evening.    [provider]  buPROPion (WELLBUTRIN SR) 150 MG 12 hr tablet Take 150 mg by mouth in the morning and at bedtime.    [provider]  carvedilol (COREG CR) 20 MG 24 hr capsule Take 20 mg by mouth daily.    [provider]  folic acid (FOLVITE) 1 MG tablet Take 1 tablet (1 mg total) by mouth  daily. 10/11/19   Rodolph Bonghompson, Daniel V, MD  gabapentin (NEURONTIN) 300 MG capsule Take 300 mg by mouth in the morning and at bedtime.    [provider]  insulin aspart (NOVOLOG) 100 UNIT/ML FlexPen Inject 4 Units into the skin 3 (three) times daily with meals. 10/10/19   Rodolph Bonghompson, Daniel V, MD  insulin glargine (LANTUS) 100 UNIT/ML Solostar Pen Inject 16 Units into the skin daily. 10/10/19   Rodolph Bonghompson, Daniel V, MD  Insulin Pen Needle (NOVOFINE) 30G X 8 MM MISC Inject 10 each into the skin as needed. 10/10/19   Rodolph Bonghompson, Daniel V, MD  lipase/protease/amylase (CREON) 36000 UNITS CPEP capsule Take 1 capsule (36,000 Units total) by mouth 3 (three) times daily before meals. 06/17/19   Laverna PeaceNettey, Shayla D, MD  losartan (COZAAR) 50 MG tablet Take 1 tablet (50 mg total) by mouth daily. 08/04/19 10/07/19  Margie EgeKyle, Tyrone A, DO  Multiple Vitamin (QUINTABS) TABS Take 1 tablet by mouth daily. 11/22/18   [provider]  pantoprazole (PROTONIX) 40 MG tablet Take 1 tablet (40 mg total) by mouth daily. 08/03/19 09/02/19  Margie EgeKyle, Tyrone A, DO  potassium chloride SA (KLOR-CON) 20 MEQ tablet Take 1 tablet (20 mEq total) by mouth 2 (two) times daily for 3 days. 08/03/19 08/06/19  Margie EgeKyle, Tyrone A, DO  thiamine 100 MG tablet Take 1 tablet (100 mg total) by mouth daily. 10/11/19   Rodolph Bonghompson, Daniel V, MD  zolpidem (AMBIEN) 10 MG tablet Take 10 mg by mouth at bedtime.     [provider]    Physical Exam:   Vitals:   03/12/20 1030 03/12/20 1045 03/12/20 1100 03/12/20 1115  BP: 99/66 107/63 104/71 108/72  Pulse: 89 89 88   Resp: 20 16 18 14   Temp:      TempSrc:      SpO2: 93% 94% 96%   Weight:      Height:         Physical Exam: Blood pressure 108/72, pulse 88, temperature 98.1 F (36.7 C), temperature source Oral, resp. rate 14, height 5\' 8"  (1.727 m), weight 84.8 kg, SpO2 96 %. Gen: Chronically ill-appearing man looking much older than stated age lying in bed states he is hungry Eyes: sclera anicteric,  conjuctiva mildly injected bilaterally CVS: S1-S2, regular, no gallops Respiratory:  decreased air entry likely secondary to decreased inspiratory effort GI: NABS, soft, NT  LE: See photographs from note by PA Marelli earlier today.  He is got wet and dry gangrene on 1-3 toes and and marked discoloration of his right forefoot. Neuro:  grossly nonfocal.  Psych: Patient seems somewhat unconcerned about his foot.  Insight is extremely poor.   Data Review:    Labs: Basic Metabolic Panel: Recent Labs  Lab 03/12/20 0956  NA 135  K 4.7  CL 100  CO2 22  GLUCOSE 433*  BUN 11  CREATININE 0.74  CALCIUM 9.2   Liver Function Tests: No results for input(s): AST, ALT, ALKPHOS, BILITOT, PROT, ALBUMIN in the last 168 hours. No results for input(s): LIPASE, AMYLASE in the last 168 hours. No results for input(s): AMMONIA in the last 168 hours. CBC: Recent Labs  Lab 03/12/20 0956  WBC 20.1*  NEUTROABS 17.4*  HGB 12.6*  HCT 38.8*  MCV 93.9  PLT 287   Cardiac Enzymes: No results for input(s): CKTOTAL, CKMB, CKMBINDEX, TROPONINI in the last 168 hours.  BNP (last 3 results) No results for input(s): PROBNP in the last 8760 hours. CBG: Recent Labs  Lab 03/12/20 0952  GLUCAP 374*    Urinalysis    Component Value Date/Time   COLORURINE YELLOW 10/07/2019 1735   APPEARANCEUR CLEAR 10/07/2019 1735   LABSPEC 1.029 10/07/2019 1735   PHURINE 5.0 10/07/2019 1735   GLUCOSEU >=500 (A) 10/07/2019 1735   HGBUR SMALL (A) 10/07/2019 1735   BILIRUBINUR NEGATIVE 10/07/2019 1735   KETONESUR 80 (A) 10/07/2019 1735   PROTEINUR 30 (A) 10/07/2019 1735   NITRITE NEGATIVE 10/07/2019 1735   LEUKOCYTESUR NEGATIVE 10/07/2019 1735      Radiographic Studies: DG Foot Complete Right  Result Date: 03/12/2020 CLINICAL DATA:  Diabetes.  Second toe necrosis. EXAM: RIGHT FOOT COMPLETE - 3+ VIEW COMPARISON:  None. FINDINGS: Extensive air is identified throughout the soft tissues of the entire right foot.  Extensive air limits evaluation of underlying bony structures. There is deformity of the distal second toe. IMPRESSION: Extensive air is identified throughout the soft tissues of the entire right foot, suspicious for infection. Extensive air limits evaluation of underlying bony structures. There is deformity of the distal second toe. Electronically Signed   By: Sherian Rein M.D.   On: 03/12/2020 10:26   VAS [place] ABI WITH/WO TBI  Result Date: 03/12/2020 LOWER EXTREMITY DOPPLER STUDY Indications: Rest pain, gangrene, and peripheral artery disease. High Risk Factors: Hypertension, hyperlipidemia, Diabetes, current smoker.  Vascular Interventions: Right leg angio and stent placed 02/25/19. Comparison Study: No previous exams Performing Technologist: Clint Guy RVT  Examination Guidelines: A complete evaluation includes at minimum, Doppler waveform signals and systolic blood pressure reading at the level of bilateral brachial, anterior tibial, and posterior tibial arteries, when vessel segments are accessible. Bilateral testing is considered an integral part of a complete examination. Photoelectric Plethysmograph (PPG) waveforms and toe systolic pressure readings are included as required and additional duplex testing as needed. Limited examinations for reoccurring indications may be performed as noted.  ABI Findings: +--------+------------------+-----+-------------------+--------+ Right   Rt Pressure (mmHg)IndexWaveform           Comment  +--------+------------------+-----+-------------------+--------+ [id number]                    triphasic                   +--------+------------------+-----+-------------------+--------+ PTA     74                0.59 dampened monophasic         +--------+------------------+-----+-------------------+--------+ DP      78                0.62 dampened monophasic         +--------+------------------+-----+-------------------+--------+  +--------+------------------+-----+----------+-------+ Left    Lt Pressure (mmHg)IndexWaveform  Comment +--------+------------------+-----+----------+-------+ [id number]  triphasic         +--------+------------------+-----+----------+-------+ PTA     74                0.59 monophasic        +--------+------------------+-----+----------+-------+ DP      79                0.63 monophasic        +--------+------------------+-----+----------+-------+ +-------+-----------+-----------+------------+------------+ ABI/TBIToday's ABIToday's TBIPrevious ABIPrevious TBI +-------+-----------+-----------+------------+------------+ Right  0.62                                           +-------+-----------+-----------+------------+------------+ Left   0.63                                           +-------+-----------+-----------+------------+------------+  Summary: Right: Resting right ankle-brachial index indicates moderate right lower extremity arterial disease. Left: Resting left ankle-brachial index indicates moderate left lower extremity arterial disease.  *See table(s) above for measurements and observations.     Preliminary     EKG: Ordered and pending   Assessment/Plan:   Principal Problem:   Gangrene of toe of right foot (HCC) Active Problems:   Alcohol abuse   Essential hypertension   Uncontrolled type 2 diabetes mellitus with diabetic polyneuropathy, with long-term current use of insulin (HCC)  60 year old man with uncontrolled diabetes, ongoing tobacco and alcohol use  presents with gangrene of his right second toe..  Gangrene/PVD We will change ceftriaxone to Zosyn to add anaerobic coverage, continue vancomycin Patient is for OR in the morning per Dr. Durwin Nora Hold ASA, Continue atorvastatin Oxycodone 5 mg every 4 hours written for the first day. DVT prophylaxis and pain management per vascular surgery  Uncontrolled DM2 with  hyperglycemia Uncontrolled, hemoglobin A1c was 12 at last check, patient noncompliant with medication and diet Will place patient on glargine 8 units, down from home dose of 16 and SSI AC at bedtime, moderate dose  HTN Continue carvedilol and Cozaar to start tomorrow  Chronic pancreatitis Thought to be secondary to pancreatic ductal stone and ongoing EtOH use Continue pancrelipase  EtOH use Patient denies any history of complicated withdrawal No evidence of hyperadrenergic state at present I have not placed patient on CIWA protocol  Tobacco Use Patient declines need for nicotine patch  Anxiety and depression Continue bupropion and Ambien     Other information:   DVT prophylaxis: Vascular surgery to order DVT prophylaxis none ordered at present as patient is for TMA in the morning and has severe vascular disease so did not want to add SCDs Code Status: Full Family Communication: None Disposition Plan: TBD Consults called: Vascular surgery Admission status: Inpatient  Ki Corbo Tublu [place] [facility]  If 7PM-7AM, please contact night-coverage [email or web address] Password [facility] 03/12/2020, 12:57 PM

## 2020-03-12 NOTE — Progress Notes (Addendum)
Pharmacy Antibiotic Note  Ricardo Howard is a 60 y.o. male admitted on 03/12/2020 with foot infection, with concern for osteomyelitis.  Pharmacy has been consulted for vancomycin dosing.  Addendum: Add Zosyn 3.375g IV every 8 hours (extended infusion)  Plan: Vancomycin 1750 mg IV x 1, then 1250 mg IV every 12 hours (Est AUC 491, Goal AUC 400-550, SCr used 0.8) Monitor renal function, foot imaging, Cx and clinical progression to narrow Vancomycin levels as needed  Height: 5\' 8"  (172.7 cm) Weight: 84.8 kg (187 lb) IBW/kg (Calculated) : 68.4  Temp (24hrs), Avg:98.1 F (36.7 C), Min:98.1 F (36.7 C), Max:98.1 F (36.7 C)  No results for input(s): WBC, CREATININE, LATICACIDVEN, VANCOTROUGH, VANCOPEAK, VANCORANDOM, GENTTROUGH, GENTPEAK, GENTRANDOM, TOBRATROUGH, TOBRAPEAK, TOBRARND, AMIKACINPEAK, AMIKACINTROU, AMIKACIN in the last 168 hours.  CrCl cannot be calculated (Patient's most recent lab result is older than the maximum 21 days allowed.).    Allergies  Allergen Reactions  . Iohexol Hives    Patient broke out in hives after injection of Omni 300, will need 13 hour pre-med in future  . Lisinopril Cough  . Simvastatin Itching and Nausea Only    , PharmD Clinical Pharmacist ED Pharmacist Phone # [phone] 03/12/2020 9:41 AM

## 2020-03-12 NOTE — Progress Notes (Signed)
ABI completed.   Please see CV Proc for preliminary results.   Ayah Cozzolino, RVT  

## 2020-03-12 NOTE — ED Provider Notes (Signed)
[facility]   CSN: [id number] Arrival date & time: 03/12/20  [id number]     History Chief Complaint  Patient presents with  . Foot Pain    Ricardo Howard is a 60 y.o. male history of PVD, diabetes, alcohol abuse, hypertension, hyperlipidemia.  Patient arrives today for right foot pain onset 4 months ago reports pain has been constant aching nonradiating worsened with movement palpation no alleviating factors. He reports he had a small blister on the great toe a few months ago and has since grown in size and now involves his first and second toe, over the past 1-2 weeks pain has worsened and is now severe and radiates up his foot and lower leg.  Denies fever/chills, fall/injury, chest pain/shortness of breath, abdominal pain, nausea/vomiting or any additional concerns.  HPI     Past Medical History:  Diagnosis Date  . Alcohol abuse 06/14/2019  . Diabetes mellitus without complication (HCC)   . Essential hypertension 06/14/2019  . Mixed hyperlipidemia due to type 2 diabetes mellitus (HCC) 07/31/2019  . Nicotine dependence, cigarettes, uncomplicated 07/31/2019  . Pancreatitis   . Uncontrolled type 2 diabetes mellitus with diabetic polyneuropathy, with long-term current use of insulin (HCC) 07/31/2019    Patient Active Problem List   Diagnosis Date Noted  . 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  . Pancreatitis 10/07/2019  . Dehydration 10/07/2019  . Hyperglycemia 10/07/2019  . Occult blood in stools   . Mixed hyperlipidemia due to type 2 diabetes mellitus (HCC) 07/31/2019  . Uncontrolled type 2 diabetes mellitus with diabetic polyneuropathy, with long-term current use of insulin (HCC) 07/31/2019  . Nicotine dependence, cigarettes, uncomplicated 07/31/2019  . Complaint of melena 07/31/2019  . Pancreatic duct calculus 06/21/2019  . Elevated LFTs 06/21/2019  . DKA (diabetic ketoacidoses) 06/14/2019  . Acute pancreatitis  06/14/2019  . Alcohol abuse 06/14/2019  . Essential hypertension 06/14/2019    Past Surgical History:  Procedure Laterality Date  . CORONARY ANGIOPLASTY WITH STENT PLACEMENT         Family History  Problem Relation Age of Onset  . Other Neg Hx     Social History   Tobacco Use  . Smoking status: Current Every Day Smoker    Packs/day: 0.25    Types: Cigarettes  . Smokeless tobacco: Never Used  Vaping Use  . Vaping Use: Never used  Substance Use Topics  . Alcohol use: Yes  . Drug use: Not Currently    Home Medications Prior to Admission medications   Medication Sig Start Date End Date Taking? Authorizing Provider  aspirin 81 MG tablet Take 81 mg by mouth daily.    [provider]  atorvastatin (LIPITOR) 80 MG tablet Take 80 mg by mouth every evening.    [provider]  buPROPion (WELLBUTRIN SR) 150 MG 12 hr tablet Take 150 mg by mouth in the morning and at bedtime.    [provider]  carvedilol (COREG CR) 20 MG 24 hr capsule Take 20 mg by mouth daily.    [provider]  folic acid (FOLVITE) 1 MG tablet Take 1 tablet (1 mg total) by mouth daily. 10/11/19   Rodolph Bonghompson, Daniel V, MD  gabapentin (NEURONTIN) 300 MG capsule Take 300 mg by mouth in the morning and at bedtime.    [provider]  insulin aspart (NOVOLOG) 100 UNIT/ML FlexPen Inject 4 Units into the skin 3 (three) times daily with meals. 10/10/19   Ramiro Harvesthompson, Daniel  V, MD  insulin glargine (LANTUS) 100 UNIT/ML Solostar Pen Inject 16 Units into the skin daily. 10/10/19   Rodolph Bonghompson, Daniel V, MD  Insulin Pen Needle (NOVOFINE) 30G X 8 MM MISC Inject 10 each into the skin as needed. 10/10/19   Rodolph Bonghompson, Daniel V, MD  lipase/protease/amylase (CREON) 36000 UNITS CPEP capsule Take 1 capsule (36,000 Units total) by mouth 3 (three) times daily before meals. 06/17/19   Laverna PeaceNettey, Shayla D, MD  losartan (COZAAR) 50 MG tablet Take 1 tablet (50 mg total) by mouth daily. 08/04/19 10/07/19  Margie EgeKyle,  Tyrone A, DO  Multiple Vitamin (QUINTABS) TABS Take 1 tablet by mouth daily. 11/22/18   [provider]  pantoprazole (PROTONIX) 40 MG tablet Take 1 tablet (40 mg total) by mouth daily. 08/03/19 09/02/19  Margie EgeKyle, Tyrone A, DO  potassium chloride SA (KLOR-CON) 20 MEQ tablet Take 1 tablet (20 mEq total) by mouth 2 (two) times daily for 3 days. 08/03/19 08/06/19  Margie EgeKyle, Tyrone A, DO  thiamine 100 MG tablet Take 1 tablet (100 mg total) by mouth daily. 10/11/19   Rodolph Bonghompson, Daniel V, MD  zolpidem (AMBIEN) 10 MG tablet Take 10 mg by mouth at bedtime.     [provider]    Allergies    Iohexol, Lisinopril, and Simvastatin  Review of Systems   Review of Systems Ten systems are reviewed and are negative for acute change except as noted in the HPI  Physical Exam Updated Vital Signs BP 108/72   Pulse 88   Temp 98.1 F (36.7 C) (Oral)   Resp 14   Ht 5\' 8"  (1.727 m)   Wt 84.8 kg   SpO2 96%   BMI 28.43 kg/m   Physical Exam Constitutional:      General: He is not in acute distress.    Appearance: Normal appearance. He is well-developed. He is not ill-appearing or diaphoretic.  HENT:     Head: Normocephalic and atraumatic.  Eyes:     General: Vision grossly intact. Gaze aligned appropriately.     Pupils: Pupils are equal, round, and reactive to light.  Neck:     Trachea: Trachea and phonation normal.  Cardiovascular:     Rate and Rhythm: Normal rate and regular rhythm.     Pulses:          Dorsalis pedis pulses are 0 on the right side and detected w/ Doppler on the left side.       Posterior tibial pulses are 0 on the right side and detected w/ Doppler on the left side.  Pulmonary:     Effort: Pulmonary effort is normal. No respiratory distress.     Breath sounds: Normal breath sounds.  Abdominal:     General: There is no distension.     Palpations: Abdomen is soft.     Tenderness: There is no abdominal tenderness. There is no guarding or rebound.  Musculoskeletal:         General: Normal range of motion.     Cervical back: Normal range of motion.  Feet:     Comments: Necrotic appearing right second toe, some necrosis also present on the first and third toe. See pictures below Skin:    General: Skin is warm and dry.  Neurological:     Mental Status: He is alert.     GCS: GCS eye subscore is 4. GCS verbal subscore is 5. GCS motor subscore is 6.     Comments: Speech is clear and goal oriented, follows commands  Major Cranial nerves without deficit, no facial droop Moves extremities without ataxia, coordination intact  Psychiatric:        Behavior: Behavior normal.         ED Results / Procedures / Treatments   Labs (all labs ordered are listed, but only abnormal results are displayed) Labs Reviewed  CBC WITH DIFFERENTIAL/PLATELET - Abnormal; Notable for the following components:      Result Value   WBC 20.1 (*)    RBC 4.13 (*)    Hemoglobin 12.6 (*)    HCT 38.8 (*)    Neutro Abs 17.4 (*)    Monocytes Absolute 1.2 (*)    Abs Immature Granulocytes 0.30 (*)    All other components within normal limits  BASIC METABOLIC PANEL - Abnormal; Notable for the following components:   Glucose, Bld 433 (*)    All other components within normal limits  LACTIC ACID, PLASMA - Abnormal; Notable for the following components:   Lactic Acid, Venous 2.8 (*)    All other components within normal limits  CBG MONITORING, ED - Abnormal; Notable for the following components:   Glucose-Capillary 374 (*)    All other components within normal limits  SARS CORONAVIRUS 2 (TAT 6-24 HRS)  CULTURE, BLOOD (ROUTINE X 2)  CULTURE, BLOOD (ROUTINE X 2)  LACTIC ACID, PLASMA  CBG MONITORING, ED    EKG None  Radiology DG Foot Complete Right  Result Date: 03/12/2020 CLINICAL DATA:  Diabetes.  Second toe necrosis. EXAM: RIGHT FOOT COMPLETE - 3+ VIEW COMPARISON:  None. FINDINGS: Extensive air is identified throughout the soft tissues of the entire right foot. Extensive air  limits evaluation of underlying bony structures. There is deformity of the distal second toe. IMPRESSION: Extensive air is identified throughout the soft tissues of the entire right foot, suspicious for infection. Extensive air limits evaluation of underlying bony structures. There is deformity of the distal second toe. Electronically Signed   By: Sherian Rein M.D.   On: 03/12/2020 10:26   VAS [place] ABI WITH/WO TBI  Result Date: 03/12/2020 LOWER EXTREMITY DOPPLER STUDY Indications: Rest pain, gangrene, and peripheral artery disease. High Risk Factors: Hypertension, hyperlipidemia, Diabetes, current smoker.  Vascular Interventions: Right leg angio and stent placed 02/25/19. Comparison Study: No previous exams Performing Technologist: Clint Guy RVT  Examination Guidelines: A complete evaluation includes at minimum, Doppler waveform signals and systolic blood pressure reading at the level of bilateral brachial, anterior tibial, and posterior tibial arteries, when vessel segments are accessible. Bilateral testing is considered an integral part of a complete examination. Photoelectric Plethysmograph (PPG) waveforms and toe systolic pressure readings are included as required and additional duplex testing as needed. Limited examinations for reoccurring indications may be performed as noted.  ABI Findings: +--------+------------------+-----+-------------------+--------+ Right   Rt Pressure (mmHg)IndexWaveform           Comment  +--------+------------------+-----+-------------------+--------+ [id number]                    triphasic                   +--------+------------------+-----+-------------------+--------+ PTA     74                0.59 dampened monophasic         +--------+------------------+-----+-------------------+--------+ DP      78                0.62 dampened monophasic         +--------+------------------+-----+-------------------+--------+  +--------+------------------+-----+----------+-------+  Left    Lt Pressure (mmHg)IndexWaveform  Comment +--------+------------------+-----+----------+-------+ [id number]                    triphasic         +--------+------------------+-----+----------+-------+ PTA     74                0.59 monophasic        +--------+------------------+-----+----------+-------+ DP      79                0.63 monophasic        +--------+------------------+-----+----------+-------+ +-------+-----------+-----------+------------+------------+ ABI/TBIToday's ABIToday's TBIPrevious ABIPrevious TBI +-------+-----------+-----------+------------+------------+ Right  0.62                                           +-------+-----------+-----------+------------+------------+ Left   0.63                                           +-------+-----------+-----------+------------+------------+  Summary: Right: Resting right ankle-brachial index indicates moderate right lower extremity arterial disease. Left: Resting left ankle-brachial index indicates moderate left lower extremity arterial disease.  *See table(s) above for measurements and observations.     Preliminary     Procedures .Critical Care Performed by: Bill Salinas, PA-C Authorized by: Bill Salinas, PA-C   Critical care provider statement:    Critical care time (minutes):  40   Critical care was time spent personally by me on the following activities:  Discussions with consultants, evaluation of patient's response to treatment, examination of patient, ordering and performing treatments and interventions, ordering and review of laboratory studies, ordering and review of radiographic studies, pulse oximetry, re-evaluation of patient's condition, obtaining history from patient or surrogate, review of old charts and development of treatment plan with patient or surrogate   Care discussed with: admitting provider     (including  critical care time)  Medications Ordered in ED Medications  vancomycin (VANCOREADY) IVPB 1750 mg/350 mL (1,750 mg Intravenous New Bag/Given 03/12/20 1129)  vancomycin (VANCOREADY) IVPB 1250 mg/250 mL (has no administration in time range)  lactated ringers bolus 1,000 mL (has no administration in time range)  lactated ringers bolus 1,000 mL (has no administration in time range)  insulin aspart (novoLOG) injection 14 Units (has no administration in time range)  sodium chloride 0.9 % bolus 500 mL (500 mLs Intravenous New Bag/Given 03/12/20 0953)  morphine 4 MG/ML injection 4 mg (4 mg Intravenous Given 03/12/20 0955)  cefTRIAXone (ROCEPHIN) 2 g in sodium chloride 0.9 % 100 mL IVPB (0 g Intravenous Stopped 03/12/20 1125)    ED Course  I have reviewed the triage vital signs and the nursing notes.  Pertinent labs & imaging results that were available during my care of the patient were reviewed by me and considered in my medical decision making (see chart for details).  Clinical Course as of 03/12/20 1310  Sat Mar 12, 2020  1025 Dr Edilia Bo [BM]  1227 Dr. Marguerite Olea [BM]    Clinical Course User Index [BM] Elizabeth Palau   MDM Rules/Calculators/A&P                         Additional history obtained from: 1. Nursing notes from this visit. 2. Review  of electronic medical records. ---------------- 60 year old male presents with necrotic right second toe this has been a problem for him for several months but worsening over the past few weeks. He has no systemic symptoms reports that he is otherwise feeling well. The pedal pulses of the left foot are dopplerable but I am unable to Doppler any pulses on the right foot. On exam there is concern for osteomyelitis. Will obtain basic labs and order empiric antibiotics with vancomycin/Rocephin. Consult placed to vascular surgery will await further input before heparin. X-ray of the foot ordered. --- 10:25 AM: Consult with vascular surgeon Dr.  Edilia Bo, advises he is coming down to see patient but would like medicine admission. No further recommendations at this time - I ordered, reviewed and interpreted labs which include: CBC shows leukocytosis of 20.1, anemia 12.6. Patient's mild tachycardia on arrival is resolved without intervention. He has no fever or hypotension, lactic pending. No tachypnea. Low suspicion for sepsis at this time but will obtain blood cultures. Antibiotics initiated  BMP shows hyperglycemia, no emergent electrolyte derangement, AKI or gap. Does not appear DKA at this time  CBG 347  DG Right Foot:  IMPRESSION:  Extensive air is identified throughout the soft tissues of the  entire right foot, suspicious for infection. Extensive air limits  evaluation of underlying bony structures. There is deformity of the  distal second toe.  - Patient seen and evaluated by vascular surgery, per their note they plan for amputation. Agree with hospitalist admission for antibiotics. They found monophasic signals at the right anterior tibial and right posterior tibial positions. - Patient reevaluated resting comfortably no acute distress he is agreeable for admission.  Vascular surgery, Dr. Edilia Bo, advises no heparin needed at this time.  Lactic is elevated 2.8, additional IV fluids ordered.  Patient has received IV antibiotics, blood cultures were obtained.  Blood pressure stable, does not appear as septic shock.  Consult placed to hospitalist service for admission. - 12:27 PM: Consult with Dr. Luberta Robertson, asked that patient received 14 units insulin aspart and recheck CBG in 1 hour.  Patient excepted for admission.  Patient seen and evaluated by Dr. Denton Lank during this visit.  Note: Portions of this report may have been transcribed using voice recognition software. Every effort was made to ensure accuracy; however, inadvertent computerized transcription errors may still be present. Final Clinical Impression(s) / ED  Diagnoses Final diagnoses:  Gangrene of toe of right foot [facility])    Rx / DC Orders ED Discharge Orders    None       Elizabeth Palau 03/12/20 1311    Cathren Laine, MD 03/12/20 1424

## 2020-03-12 NOTE — Consult Note (Signed)
REASON FOR CONSULT:    Gangrene of the right foot.  The consult is requested by Dr. Denton Lank.   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  He underwent a vascular intervention in [place] on 02/25/20 by Dr. Willette Pa his vascular surgeon.  I am unable to obtain the records except for an abbreviated op note.  It looks like the patient had angioplasty and stenting of the right femoral artery and popliteal artery.  I do not have the images.  The patient is being admitted by the hospitalist for intravenous antibiotics.  I explained to the patient that he will likely need at the very least a transmetatarsal amputation given the extent of the infection.  He is at very high risk for the knee for a more proximal amputation.  He has monophasic signals in the anterior tibial and posterior tibial position on the right and I am not sure he will heal a TMA.  I am unable to retrieve the notes or images from the arteriogram earlier this month and can try to contact their office on Monday.  I have ordered ABIs.  We have also discussed the importance of tobacco cessation.   Waverly Ferrari, MD Office: [phone]   HPI:   Ricardo Howard is a pleasant 60 y.o. male, who presents to the emergency department with worsening pain of the right foot.  He developed a wound on his right second toe 4 months ago that was not healing.  He developed dry gangrene of the second toe and adjacent first and third toes.  Reportedly he underwent an arteriogram last week in [facility] and a stent was placed in the right leg.  However in reviewing the records it looks like that procedure was performed on 02/25/2020.  Prior to this the patient does admit to bilateral lower extremity claudication which was worse on the right side.  He is also developed some rest pain in the right foot over the last 2  weeks although this may simply be related to the wound.  His risk factors for peripheral vascular disease include diabetes, hypertension, hypercholesterolemia, and tobacco use.  He smokes 5 cigarettes a day currently.  He has been smoking for 30 to 40 years.  He denies any family history of premature cardiovascular disease.  He is on aspirin and is on a statin but is not on Plavix  Past Medical History:  Diagnosis Date  . Alcohol abuse 06/14/2019  . Diabetes mellitus without complication (HCC)   . Essential hypertension 06/14/2019  . Mixed hyperlipidemia due to type 2 diabetes mellitus (HCC) 07/31/2019  . Nicotine dependence, cigarettes, uncomplicated 07/31/2019  . Pancreatitis   . Uncontrolled type 2 diabetes mellitus with diabetic polyneuropathy, with long-term current use of insulin (HCC) 07/31/2019    Family History  Problem Relation Age of Onset  . Other Neg Hx     SOCIAL HISTORY: Social History   Socioeconomic History  . Marital status: Married    Spouse name: Not on file  . Number of children: Not on file  . Years of education: Not on file  . Highest education level: Not on file  Occupational History  . Not on file  Tobacco Use  . Smoking status: Current Every Day Smoker    Packs/day: 0.25    Types: Cigarettes  . Smokeless tobacco: Never Used  Vaping  Use  . Vaping Use: Never used  Substance and Sexual Activity  . Alcohol use: Yes  . Drug use: Not Currently  . Sexual activity: Not on file  Other Topics Concern  . Not on file  Social History Narrative  . Not on file   Social Determinants of Health   Financial Resource Strain: Not on file  Food Insecurity: Not on file  Transportation Needs: Not on file  Physical Activity: Not on file  Stress: Not on file  Social Connections: Not on file  Intimate Partner Violence: Not on file    Allergies  Allergen Reactions  . Iohexol Hives    Patient broke out in hives after injection of Omni 300, will need 13 hour  pre-med in future  . Lisinopril Cough  . Simvastatin Itching and Nausea Only    Current Facility-Administered Medications  Medication Dose Route Frequency Provider Last Rate Last Admin  . cefTRIAXone (ROCEPHIN) 2 g in sodium chloride 0.9 % 100 mL IVPB  2 g Intravenous Once Harlene Salts A, PA-C 200 mL/hr at 03/12/20 1049 2 g at 03/12/20 1049  . vancomycin (VANCOREADY) IVPB 1750 mg/350 mL  1,750 mg Intravenous Once Daylene Posey, [facility]       Current Outpatient Medications  Medication Sig Dispense Refill  . aspirin 81 MG tablet Take 81 mg by mouth daily.    Marland Kitchen atorvastatin (LIPITOR) 80 MG tablet Take 80 mg by mouth every evening.    Marland Kitchen buPROPion (WELLBUTRIN SR) 150 MG 12 hr tablet Take 150 mg by mouth in the morning and at bedtime.    . carvedilol (COREG CR) 20 MG 24 hr capsule Take 20 mg by mouth daily.    . folic acid (FOLVITE) 1 MG tablet Take 1 tablet (1 mg total) by mouth daily.    Marland Kitchen gabapentin (NEURONTIN) 300 MG capsule Take 300 mg by mouth in the morning and at bedtime.    . insulin aspart (NOVOLOG) 100 UNIT/ML FlexPen Inject 4 Units into the skin 3 (three) times daily with meals. 15 mL 0  . insulin glargine (LANTUS) 100 UNIT/ML Solostar Pen Inject 16 Units into the skin daily. 15 mL 0  . Insulin Pen Needle (NOVOFINE) 30G X 8 MM MISC Inject 10 each into the skin as needed. 100 each 0  . lipase/protease/amylase (CREON) 36000 UNITS CPEP capsule Take 1 capsule (36,000 Units total) by mouth 3 (three) times daily before meals. 270 capsule 0  . losartan (COZAAR) 50 MG tablet Take 1 tablet (50 mg total) by mouth daily. 30 tablet 0  . Multiple Vitamin (QUINTABS) TABS Take 1 tablet by mouth daily.    . pantoprazole (PROTONIX) 40 MG tablet Take 1 tablet (40 mg total) by mouth daily. 30 tablet 0  . potassium chloride SA (KLOR-CON) 20 MEQ tablet Take 1 tablet (20 mEq total) by mouth 2 (two) times daily for 3 days. 6 tablet 0  . thiamine 100 MG tablet Take 1 tablet (100 mg total) by mouth daily.     Marland Kitchen zolpidem (AMBIEN) 10 MG tablet Take 10 mg by mouth at bedtime.       REVIEW OF SYSTEMS:  [X]  denotes positive finding, [ ]  denotes negative finding Cardiac  Comments:  Chest pain or chest pressure:    Shortness of breath upon exertion:    Short of breath when lying flat:    Irregular heart rhythm:        Vascular    Pain in calf, thigh, or hip brought on  by ambulation: x   Pain in feet at night that wakes you up from your sleep:  x   Blood clot in your veins:    Leg swelling:         Pulmonary    Oxygen at home:    Productive cough:     Wheezing:         Neurologic    Sudden weakness in arms or legs:     Sudden numbness in arms or legs:     Sudden onset of difficulty speaking or slurred speech:    Temporary loss of vision in one eye:     Problems with dizziness:         Gastrointestinal    Blood in stool:     Vomited blood:         Genitourinary    Burning when urinating:     Blood in urine:        Psychiatric    Major depression:         Hematologic    Bleeding problems:    Problems with blood clotting too easily:        Skin    Rashes or ulcers: x       Constitutional    Fever or chills:     PHYSICAL EXAM:   Vitals:   03/12/20 0945 03/12/20 1000 03/12/20 1015 03/12/20 1030  BP: 108/70 104/73 104/62 99/66  Pulse:   88 89  Resp: 16 13 14 20   Temp:      TempSrc:      SpO2:   97% 93%  Weight:      Height:        GENERAL: The patient is a well-nourished male, in no acute distress. The vital signs are documented above. CARDIAC: There is a regular rate and rhythm.  VASCULAR: I do not detect carotid bruits. He has slightly diminished femoral pulses bilaterally. On the right side, which is the site of concern, I cannot palpate pedal pulses.  He has a monophasic posterior tibial and anterior tibial signal with a Doppler.  I cannot obtain a dorsalis pedis signal. On the left side, I cannot palpate pedal pulses.  He has a monophasic anterior tibial and  posterior tibial signal.  I cannot obtain a dorsalis pedis signal. PULMONARY: There is good air exchange bilaterally without wheezing or rales. ABDOMEN: Soft and non-tender with normal pitched bowel sounds.  MUSCULOSKELETAL: There are no major deformities or cyanosis. NEUROLOGIC: No focal weakness or paresthesias are detected. SKIN: He has extensive wounds involving the forefoot on the right foot.  The second toe has dry gangrene.  There is an adjacent wound on the first and third toes.  There is blistering on the dorsum of the foot extending all the way back towards the ankle.        PSYCHIATRIC: The patient has a normal affect.  DATA:    LABS:   His COVID test is pending.  His white blood cell count is 20,000.  Hemoglobin 12.6.  Platelets 287,000.  BMET is pending.  X-ray right foot.  There is extensive air throughout the soft tissues of the entire right foot.

## 2020-03-12 NOTE — Progress Notes (Signed)
VASCULAR SURGERY ADDENDUM:  I did find his operative report from his angioplasty that was done in [facility] on 02/25/2020.  I am unable to review the images but according to the note the patient had nonflow limiting disease in the common iliac artery, external iliac artery, and superficial femoral artery.  There was then an occlusion of the distal superficial femoral artery which was successfully addressed with angioplasty.  Below that there was disease in the popliteal artery at the level of the knee.  The tibial vessels were open with dominant runoff via the posterior tibial artery.  The fact that this wound did not improve despite revascularization certainly is discouraging and he is at high risk for below the knee amputation.  I reviewed his Doppler studies that were done today.  On the right side he had a dampened and monophasic dorsalis pedis and posterior tibial signal.  ABI was 62%.  Given the extensive wound on the right foot with a white count of 20,000 and extensive air throughout the soft tissue involving the right foot I have recommended open transmetatarsal amputation tomorrow to help control the infection.  He is currently receiving intravenous vancomycin and Zosyn.  I have discussed this with the patient and he is agreeable to proceed tomorrow.  Waverly Ferrari, MD Office: [phone]

## 2020-03-12 NOTE — ED Notes (Signed)
Vascular at bedside

## 2020-03-12 NOTE — ED Notes (Signed)
Date and time results received: 03/12/20  Test: Lactic Acid Critical Value: 2.8  Name of Provider Notified: Garrochales PA

## 2020-03-12 NOTE — ED Triage Notes (Signed)
C/o pain right foot , states it started with a blister 4 months ago now the pain radiates up to his thigh.

## 2020-03-12 NOTE — ED Notes (Signed)
RN attempted to call report to floor x 1-Monique,RN

## 2020-03-12 NOTE — ED Notes (Signed)
Pt transported to vasscular

## 2020-03-12 NOTE — ED Notes (Signed)
Pt transported to vascular.  °

## 2020-03-13 ENCOUNTER — Encounter (HOSPITAL_COMMUNITY)
Admission: EM | Disposition: A | Discharge status: Rehabilitation Facility | Payer: Self-pay | Source: Home / Self Care | Attending: Internal Medicine

## 2020-03-13 ENCOUNTER — Other Ambulatory Visit: Payer: Self-pay

## 2020-03-13 ENCOUNTER — Encounter (HOSPITAL_COMMUNITY): Payer: Self-pay | Admitting: Internal Medicine

## 2020-03-13 ENCOUNTER — Inpatient Hospital Stay (HOSPITAL_COMMUNITY): Payer: No Typology Code available for payment source | Admitting: Certified Registered"

## 2020-03-13 DIAGNOSIS — I96 Gangrene, not elsewhere classified: Secondary | ICD-10-CM | POA: Diagnosis not present

## 2020-03-13 DIAGNOSIS — I70261 Atherosclerosis of native arteries of extremities with gangrene, right leg: Secondary | ICD-10-CM | POA: Diagnosis not present

## 2020-03-13 HISTORY — PX: AMPUTATION: SHX166

## 2020-03-13 LAB — BASIC METABOLIC PANEL
Anion gap: 11 (ref 5–15)
BUN: 10 mg/dL (ref 6–20)
CO2: 21 mmol/L — ABNORMAL LOW (ref 22–32)
Calcium: 8.2 mg/dL — ABNORMAL LOW (ref 8.9–10.3)
Chloride: 104 mmol/L (ref 98–111)
Creatinine, Ser: 0.67 mg/dL (ref 0.61–1.24)
GFR, Estimated: >60 mL/min (ref >60)
Glucose, Bld: 267 mg/dL — ABNORMAL HIGH (ref 70–99)
Potassium: 4.1 mmol/L (ref 3.5–5.1)
Sodium: 136 mmol/L (ref 135–145)

## 2020-03-13 LAB — GLUCOSE, CAPILLARY
Glucose-Capillary: 317 mg/dL — ABNORMAL HIGH (ref 70–99)
Glucose-Capillary: 242 mg/dL — ABNORMAL HIGH (ref 70–99)
Glucose-Capillary: 201 mg/dL — ABNORMAL HIGH (ref 70–99)
Glucose-Capillary: 237 mg/dL — ABNORMAL HIGH (ref 70–99)
Glucose-Capillary: 189 mg/dL — ABNORMAL HIGH (ref 70–99)
Glucose-Capillary: 197 mg/dL — ABNORMAL HIGH (ref 70–99)
Glucose-Capillary: 182 mg/dL — ABNORMAL HIGH (ref 70–99)

## 2020-03-13 LAB — CBC
HCT: 34.9 % — ABNORMAL LOW (ref 39.0–52.0)
Hemoglobin: 11.9 g/dL — ABNORMAL LOW (ref 13.0–17.0)
MCH: 30.9 pg (ref 26.0–34.0)
MCHC: 34.1 g/dL (ref 30.0–36.0)
MCV: 90.6 fL (ref 80.0–100.0)
Platelets: 325 10*3/uL (ref 150–400)
RBC: 3.85 MIL/uL — ABNORMAL LOW (ref 4.22–5.81)
RDW: 13.8 % (ref 11.5–15.5)
WBC: 19.6 10*3/uL — ABNORMAL HIGH (ref 4.0–10.5)
nRBC: 0 % (ref 0.0–0.2)

## 2020-03-13 SURGERY — AMPUTATION, FOOT, RAY
Anesthesia: General | Site: Foot | Laterality: Right

## 2020-03-13 MED ORDER — BUPIVACAINE HCL (PF) 0.5 % IJ SOLN
INTRAMUSCULAR | Status: DC | PRN
Start: 2020-03-13 — End: 2020-03-13
  Administered 2020-03-13: 30 mL via PERINEURAL

## 2020-03-13 MED ORDER — OXYCODONE-ACETAMINOPHEN 5-325 MG PO TABS
1.0000 | ORAL_TABLET | ORAL | Status: DC | PRN
  Administered 2020-03-13 (×2): 1 via ORAL
  Administered 2020-03-13 – 2020-03-14 (×2): 2 via ORAL
  Administered 2020-03-14: 1 via ORAL
  Administered 2020-03-14 – 2020-03-15 (×3): 2 via ORAL
  Administered 2020-03-15: 1 via ORAL
  Administered 2020-03-16 – 2020-03-19 (×12): 2 via ORAL
  Filled 2020-03-13 (×5): qty 2
  Filled 2020-03-13: qty 1
  Filled 2020-03-13 (×5): qty 2
  Filled 2020-03-13: qty 1
  Filled 2020-03-13 (×6): qty 2
  Filled 2020-03-13: qty 1
  Filled 2020-03-13: qty 2
  Filled 2020-03-13: qty 1

## 2020-03-13 MED ORDER — 0.9 % SODIUM CHLORIDE (POUR BTL) OPTIME
TOPICAL | Status: DC | PRN
  Administered 2020-03-13: 1000 mL

## 2020-03-13 MED ORDER — PROPOFOL 10 MG/ML IV BOLUS
INTRAVENOUS | Status: AC
  Filled 2020-03-13: qty 20

## 2020-03-13 MED ORDER — ENOXAPARIN SODIUM 40 MG/0.4ML ~~LOC~~ SOLN
40.0000 mg | SUBCUTANEOUS | Status: DC
  Administered 2020-03-14 – 2020-03-19 (×5): 40 mg via SUBCUTANEOUS
  Filled 2020-03-13 (×5): qty 0.4

## 2020-03-13 MED ORDER — PHENYLEPHRINE HCL (PRESSORS) 10 MG/ML IV SOLN
INTRAVENOUS | Status: DC | PRN
  Administered 2020-03-13 (×2): 80 ug via INTRAVENOUS

## 2020-03-13 MED ORDER — INSULIN ASPART 100 UNIT/ML ~~LOC~~ SOLN
4.0000 [IU] | Freq: Three times a day (TID) | SUBCUTANEOUS | Status: DC
  Administered 2020-03-14 – 2020-03-19 (×11): 4 [IU] via SUBCUTANEOUS

## 2020-03-13 MED ORDER — ONDANSETRON HCL 4 MG/2ML IJ SOLN
INTRAMUSCULAR | Status: DC | PRN
  Administered 2020-03-13: 4 mg via INTRAVENOUS

## 2020-03-13 MED ORDER — INSULIN ASPART 100 UNIT/ML FLEXPEN: 4.0000 [IU] | PEN_INJECTOR | Freq: Three times a day (TID) | SUBCUTANEOUS | Status: DC

## 2020-03-13 MED ORDER — LIDOCAINE 2% (20 MG/ML) 5 ML SYRINGE
INTRAMUSCULAR | Status: DC | PRN
  Administered 2020-03-13: 20 mg via INTRAVENOUS

## 2020-03-13 MED ORDER — PROPOFOL 10 MG/ML IV BOLUS
INTRAVENOUS | Status: DC | PRN
  Administered 2020-03-13: 200 mg via INTRAVENOUS

## 2020-03-13 MED ORDER — BACITRACIN ZINC 500 UNIT/GM EX OINT
TOPICAL_OINTMENT | CUTANEOUS | Status: DC | PRN
  Administered 2020-03-13: 1 via TOPICAL

## 2020-03-13 MED ORDER — MIDAZOLAM HCL 5 MG/5ML IJ SOLN
INTRAMUSCULAR | Status: DC | PRN
  Administered 2020-03-13: 2 mg via INTRAVENOUS

## 2020-03-13 MED ORDER — PHENYLEPHRINE 40 MCG/ML (10ML) SYRINGE FOR IV PUSH (FOR BLOOD PRESSURE SUPPORT)
PREFILLED_SYRINGE | INTRAVENOUS | Status: AC
  Filled 2020-03-13: qty 10

## 2020-03-13 MED ORDER — FENTANYL CITRATE (PF) 100 MCG/2ML IJ SOLN
INTRAMUSCULAR | Status: DC | PRN
  Administered 2020-03-13 (×2): 50 ug via INTRAVENOUS

## 2020-03-13 MED ORDER — FENTANYL CITRATE (PF) 250 MCG/5ML IJ SOLN
INTRAMUSCULAR | Status: AC
  Filled 2020-03-13: qty 5

## 2020-03-13 MED ORDER — MIDAZOLAM HCL 2 MG/2ML IJ SOLN
INTRAMUSCULAR | Status: AC
  Filled 2020-03-13: qty 2

## 2020-03-13 MED ORDER — BACITRACIN ZINC 500 UNIT/GM EX OINT
TOPICAL_OINTMENT | CUTANEOUS | Status: AC
  Filled 2020-03-13: qty 28.35

## 2020-03-13 MED ORDER — PHENYLEPHRINE HCL-NACL 10-0.9 MG/250ML-% IV SOLN
INTRAVENOUS | Status: DC | PRN
  Administered 2020-03-13: 25 ug/min via INTRAVENOUS

## 2020-03-13 SURGICAL SUPPLY — 33 items
BLADE LONG MED 31X9 (MISCELLANEOUS) ×2 IMPLANT
BNDG CONFORM 3 STRL LF (GAUZE/BANDAGES/DRESSINGS) ×2 IMPLANT
BNDG ELASTIC 4X5.8 VLCR STR LF (GAUZE/BANDAGES/DRESSINGS) ×2 IMPLANT
BNDG GAUZE ELAST 4 BULKY (GAUZE/BANDAGES/DRESSINGS) ×2 IMPLANT
CANISTER SUCT 3000ML PPV (MISCELLANEOUS) ×2 IMPLANT
COVER SURGICAL LIGHT HANDLE (MISCELLANEOUS) ×2 IMPLANT
COVER WAND RF STERILE (DRAPES) ×2 IMPLANT
DRAPE HALF SHEET 40X57 (DRAPES) ×2 IMPLANT
ELECT REM PT RETURN 9FT ADLT (ELECTROSURGICAL) ×2
ELECTRODE REM PT RTRN 9FT ADLT (ELECTROSURGICAL) ×1 IMPLANT
GAUZE SPONGE 4X4 12PLY STRL (GAUZE/BANDAGES/DRESSINGS) ×2 IMPLANT
GLOVE BIO SURGEON STRL SZ7.5 (GLOVE) ×5 IMPLANT
GLOVE SRG 8 PF TXTR STRL LF DI (GLOVE) ×1 IMPLANT
GLOVE SURG UNDER POLY LF SZ8 (GLOVE) ×1
GOWN STRL REUS W/ TWL LRG LVL3 (GOWN DISPOSABLE) ×3 IMPLANT
GOWN STRL REUS W/TWL LRG LVL3 (GOWN DISPOSABLE) ×3
KIT BASIN OR (CUSTOM PROCEDURE TRAY) ×2 IMPLANT
KIT TURNOVER KIT B (KITS) ×2 IMPLANT
NS IRRIG 1000ML POUR BTL (IV SOLUTION) ×2 IMPLANT
PACK GENERAL/GYN (CUSTOM PROCEDURE TRAY) ×2 IMPLANT
PAD ARMBOARD 7.5X6 YLW CONV (MISCELLANEOUS) ×4 IMPLANT
SPECIMEN JAR SMALL (MISCELLANEOUS) ×2 IMPLANT
SUT ETHILON 3 0 PS 1 (SUTURE) ×2 IMPLANT
SUT SILK 3 0 (SUTURE) ×1
SUT SILK 3-0 18XBRD TIE 12 (SUTURE) IMPLANT
SUT SILK 4 0 (SUTURE) ×1
SUT SILK 4-0 18XBRD TIE 12 (SUTURE) IMPLANT
SWAB CULTURE ESWAB REG 1ML (MISCELLANEOUS) ×1 IMPLANT
SWAB CULTURE LIQUID MINI MALE (MISCELLANEOUS) ×1 IMPLANT
TOWEL GREEN STERILE (TOWEL DISPOSABLE) ×4 IMPLANT
TOWEL GREEN STERILE FF (TOWEL DISPOSABLE) ×2 IMPLANT
UNDERPAD 30X36 HEAVY ABSORB (UNDERPADS AND DIAPERS) ×2 IMPLANT
WATER STERILE IRR 1000ML POUR (IV SOLUTION) ×2 IMPLANT

## 2020-03-13 NOTE — Anesthesia Preprocedure Evaluation (Addendum)
Anesthesia Evaluation  Patient identified by MRN, date of birth, ID band Patient awake    Reviewed: Allergy & Precautions, NPO status , Patient's Chart, lab work & pertinent test results  Airway Mallampati: II  TM Distance: >3 FB Neck ROM: Full    Dental  (+) Dental Advisory Given   Pulmonary Current Smoker,    breath sounds clear to auscultation       Cardiovascular hypertension, Pt. on medications  Rhythm:Regular Rate:Normal     Neuro/Psych  Neuromuscular disease    GI/Hepatic negative GI ROS, Neg liver ROS,   Endo/Other  diabetes, Type 2  Renal/GU negative Renal ROS     Musculoskeletal   Abdominal   Peds  Hematology negative hematology ROS (+)   Anesthesia Other Findings   Reproductive/Obstetrics                             Anesthesia Physical Anesthesia Plan  ASA: III  Anesthesia Plan: General   Post-op Pain Management:  Regional for Post-op pain   Induction: Intravenous  PONV Risk Score and Plan: 1 and Ondansetron, Dexamethasone and Treatment may vary due to age or medical condition  Airway Management Planned: LMA  Additional Equipment:   Intra-op Plan:   Post-operative Plan: Extubation in OR  Informed Consent: I have reviewed the patients History and Physical, chart, labs and discussed the procedure including the risks, benefits and alternatives for the proposed anesthesia with the patient or authorized representative who has indicated his/her understanding and acceptance.     Dental advisory given  Plan Discussed with: CRNA  Anesthesia Plan Comments:         Anesthesia Quick Evaluation

## 2020-03-13 NOTE — Progress Notes (Signed)
Patient transported to OR, all assessments remained unchanged.

## 2020-03-13 NOTE — Anesthesia Postprocedure Evaluation (Signed)
Anesthesia Post Note  Patient: Ricardo Howard  Procedure(s) Performed: TRANSMETATARSAL AMPUTATION (Right Foot)     Patient location during evaluation: PACU Anesthesia Type: General Level of consciousness: awake and alert Pain management: pain level controlled Vital Signs Assessment: post-procedure vital signs reviewed and stable Respiratory status: spontaneous breathing, nonlabored ventilation, respiratory function stable and patient connected to nasal cannula oxygen Cardiovascular status: blood pressure returned to baseline and stable Postop Assessment: no apparent nausea or vomiting Anesthetic complications: no   No complications documented.  Last Vitals:  Vitals:   03/13/20 0900 03/13/20 0914  BP: (!) 148/86 (!) 143/90  Pulse: 91 91  Resp: (!) 24 20  Temp: 36.9 C 37.1 C  SpO2: 95% 94%    Last Pain:  Vitals:   03/13/20 0914  TempSrc: Oral  PainSc:                  Kennieth Rad

## 2020-03-13 NOTE — Progress Notes (Signed)
PROGRESS NOTE    Brylee Mcgreal  ASN:[id number] DOB: 1960-04-09 DOA: 03/12/2020 PCP: [facility]    Brief Narrative:  60 year old male with history of hypertension, uncontrolled type 2 diabetes, severe peripheral artery disease, ongoing tobacco use, alcohol use and chronic pancreatitis with recent admission to [facility] for gangrene of the right foot status post stenting presents to the hospital with increasing pain of the right foot.  In the emergency room, he was noted to have wet gangrene of the second right toe.  Surgery was consulted.  Was started on IV antibiotics.  He was taken to operating room for TMA today, unhealthy tissue and plan for right BKA on 1/25.   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  Blood cultures pending. Underwent TMA today, found to have extensive necrotic tissue. Continue antibiotics and resuscitation. Planned for below-knee amputation on 1/25.  # Uncontrolled type 2 diabetes with hyperglycemia: Noncompliant on insulin.  A1c more than 11.  Resumed home dose of insulin with long-acting and prandial insulin.  We will continue to uptitrate.  # Hypertension: Blood pressure stable.  Resume carvedilol and Cozaar.  # Smoker: Counseled to quit.  Nicotine patch.  # Alcohol use disorder: Patient drinks about 6 packs of beer a day.  Can go without drinking.  Will be closely monitored for withdrawal.  DVT prophylaxis: enoxaparin (LOVENOX) injection 40 mg Start: 03/13/20 1400   Code Status: Full code Family Communication: Wife at the bedside Disposition Plan:  Status is: Inpatient  Remains inpatient appropriate because:Inpatient level of care appropriate due to severity of illness   Dispo: The patient is from: Home              Anticipated d/c is to: CIR              Anticipated d/c date is: > 3 days              Patient currently is not medically stable to d/c.   Difficult to place patient No         Consultants:   Vascular surgery  Procedures:   Right transmetatarsal amputation  Antimicrobials:  Antibiotics Given (last 72 hours)    Date/Time Action Medication Dose Rate   03/12/20 1049 New Bag/Given   cefTRIAXone (ROCEPHIN) 2 g in sodium chloride 0.9 % 100 mL IVPB 2 g 200 mL/hr   03/12/20 1129 New Bag/Given   vancomycin (VANCOREADY) IVPB 1750 mg/350 mL 1,750 mg 175 mL/hr   03/12/20 2156 New Bag/Given   piperacillin-tazobactam (ZOSYN) IVPB 3.375 g 3.375 g 12.5 mL/hr   03/13/20 0147 New Bag/Given   vancomycin (VANCOREADY) IVPB 1250 mg/250 mL 1,250 mg 166.7 mL/hr   03/13/20 0544 New Bag/Given   piperacillin-tazobactam (ZOSYN) IVPB 3.375 g 3.375 g 12.5 mL/hr   03/13/20 0742 Given   piperacillin-tazobactam (ZOSYN) IVPB 3.375 g 3.375 g          Subjective: Patient seen and examined.  Came back from procedure.  Wife was at the bedside.  Denies any complaints.  Vascular surgery have already discussed to him about below-knee amputation and they are agreeable. Objective: Vitals:   03/13/20 0845  03/13/20 0848 03/13/20 0900 03/13/20 0914  BP: (!) 140/91 140/85 (!) 148/86 (!) 143/90  Pulse: 94 94 91 91  Resp: 20 (!) 22 (!) 24 20  Temp:   98.4 F (36.9 C) 98.7 F (37.1 C)  TempSrc:    Oral  SpO2: 95% 94% 95% 94%  Weight:      Height:        Intake/Output Summary (Last 24 hours) at 03/13/2020 1314 Last data filed at 03/13/2020 0815 Gross per 24 hour  Intake 3035.59 ml  Output 620 ml  Net 2415.59 ml   Filed Weights   03/12/20 0906 03/12/20 2048  Weight: 84.8 kg 87.1 kg    Examination:  General exam: Appears calm  and comfortable  Respiratory system: Clear to auscultation. Respiratory effort normal. Cardiovascular system: S1 & S2 heard, RRR. No JVD, murmurs, rubs, gallops or clicks.  Gastrointestinal system: Abdomen is nondistended, soft and nontender. No organomegaly or masses felt. Normal bowel sounds heard. Central nervous system: Alert and oriented. No focal neurological deficits. Extremities: Symmetric 5 x 5 power. Skin: No rashes, lesions or ulcers Psychiatry: Judgement and insight appear normal. Mood & affect appropriate.  Right foot with immediate postop dressing.  Not removed by me.   Data Reviewed: I have personally reviewed following labs and imaging studies  CBC: Recent Labs  Lab 03/12/20 0956 03/13/20 0310  WBC 20.1* 19.6*  NEUTROABS 17.4*  --   HGB 12.6* 11.9*  HCT 38.8* 34.9*  MCV 93.9 90.6  PLT 287 325   Basic Metabolic Panel: Recent Labs  Lab 03/12/20 0956 03/13/20 0310  NA 135 136  K 4.7 4.1  CL 100 104  CO2 22 21*  GLUCOSE 433* 267*  BUN 11 10  CREATININE 0.74 0.67  CALCIUM 9.2 8.2*   GFR: Estimated Creatinine Clearance: 105.4 mL/min (by C-G formula based on SCr of 0.67 mg/dL). Liver Function Tests: No results for input(s): AST, ALT, ALKPHOS, BILITOT, PROT, ALBUMIN in the last 168 hours. No results for input(s): LIPASE, AMYLASE in the last 168 hours. No results for input(s): AMMONIA in the last 168 hours. Coagulation Profile: No results for input(s): INR, PROTIME in the last 168 hours. Cardiac Enzymes: No results for input(s): CKTOTAL, CKMB, CKMBINDEX, TROPONINI in the last 168 hours. BNP (last 3 results) No results for input(s): PROBNP in the last 8760 hours. HbA1C: Recent Labs    03/12/20 2055  HGBA1C 11.4*   CBG: Recent Labs  Lab 03/12/20 2132 03/13/20 0601 03/13/20 0650 03/13/20 0833 03/13/20 1110  GLUCAP 301* 317* 242* 201* 237*   Lipid Profile: No results for input(s): CHOL, HDL, LDLCALC, TRIG, CHOLHDL, LDLDIRECT in the last 72  hours. Thyroid Function Tests: No results for input(s): TSH, T4TOTAL, FREET4, T3FREE, THYROIDAB in the last 72 hours. Anemia Panel: No results for input(s): VITAMINB12, FOLATE, FERRITIN, TIBC, IRON, RETICCTPCT in the last 72 hours. Sepsis Labs: Recent Labs  Lab 03/12/20 0956 03/12/20 1342  LATICACIDVEN 2.8* 1.5    Recent Results (from the past 240 hour(s))  SARS CORONAVIRUS 2 (TAT 6-24 HRS) Nasopharyngeal Nasopharyngeal Swab     Status: None   Collection Time: 03/12/20  9:35 AM   Specimen: Nasopharyngeal Swab  Result Value Ref Range Status   SARS Coronavirus 2 NEGATIVE NEGATIVE Final    Comment: (NOTE) SARS-CoV-2 target nucleic acids are NOT DETECTED.  The SARS-CoV-2 RNA is generally detectable in upper and lower respiratory specimens during the acute phase of infection. Negative results do not preclude SARS-CoV-2 infection, do not rule  out co-infections with other pathogens, and should not be used as the sole basis for treatment or other patient management decisions. Negative results must be combined with clinical observations, patient history, and epidemiological information. The expected result is Negative.  Fact Sheet for Patients: HairSlick.nohttps://www.fda.gov/media/138098/download  Fact Sheet for Healthcare Providers: quierodirigir.comhttps://www.fda.gov/media/138095/download  This test is not yet approved or cleared by the [place] FDA and  has been authorized for detection and/or diagnosis of SARS-CoV-2 by FDA under an Emergency Use Authorization (EUA). This EUA will remain  in effect (meaning this test can be used) for the duration of the COVID-19 declaration under Se ction 564(b)(1) of the Act, 21 U.S.C. section 360bbb-3(b)(1), unless the authorization is terminated or revoked sooner.  Performed at [facility], [address] [place], [place] [address]   Blood culture (routine x 2)     Status: None (Preliminary result)   Collection Time: 03/12/20  9:37 AM   Specimen:  BLOOD  Result Value Ref Range Status   Specimen Description BLOOD LEFT ANTECUBITAL  Final   Special Requests   Final    BOTTLES DRAWN AEROBIC AND ANAEROBIC Blood Culture adequate volume   Culture   Final    NO GROWTH < 24 HOURS Performed at [facility], [address] [place], [place] [address]    Report Status PENDING  Incomplete  Blood culture (routine x 2)     Status: None (Preliminary result)   Collection Time: 03/12/20  8:55 PM   Specimen: BLOOD  Result Value Ref Range Status   Specimen Description BLOOD LEFT ANTECUBITAL  Final   Special Requests   Final    BOTTLES DRAWN AEROBIC ONLY Blood Culture adequate volume   Culture   Final    NO GROWTH < 12 HOURS Performed at [facility], [address] [place], [place] [address]    Report Status PENDING  Incomplete  Surgical pcr screen     Status: None   Collection Time: 03/12/20  9:04 PM   Specimen: Nasal Mucosa; Nasal Swab  Result Value Ref Range Status   MRSA, PCR NEGATIVE NEGATIVE Final   Staphylococcus aureus NEGATIVE NEGATIVE Final    Comment: (NOTE) The Xpert SA Assay (FDA approved for NASAL specimens in patients 61 years of age and older), is one component of a comprehensive surveillance program. It is not intended to diagnose infection nor to guide or monitor treatment. Performed at [facility], [address] [place], [place] [address]   Aerobic/Anaerobic Culture (surgical/deep wound)     Status: None (Preliminary result)   Collection Time: 03/13/20  8:07 AM   Specimen: Wound  Result Value Ref Range Status   Specimen Description WOUND  Final   Special Requests NONE  Final   Gram Stain   Final    NO WBC SEEN ABUNDANT GRAM NEGATIVE RODS MODERATE GRAM POSITIVE COCCI Performed at [facility], [address] [place], [place] [address]    Culture PENDING  Incomplete   Report Status PENDING  Incomplete         Radiology Studies: DG Foot Complete Right  Result Date:  03/12/2020 CLINICAL DATA:  Diabetes.  Second toe necrosis. EXAM: RIGHT FOOT COMPLETE - 3+ VIEW COMPARISON:  None. FINDINGS: Extensive air is identified throughout the soft tissues of the entire right foot. Extensive air limits evaluation of underlying bony structures. There is deformity of the distal second toe. IMPRESSION: Extensive air is identified throughout the soft tissues  of the entire right foot, suspicious for infection. Extensive air limits evaluation of underlying bony structures. There is deformity of the distal second toe. Electronically Signed   By: Sherian ReinWei-Chen  Lin M.D.   On: 03/12/2020 10:26   VAS US ABI WITH/WO TBI  Result Date: 03/12/2020 LOWER EXTREMITY DOPPLER STUDY Indications: Rest pain, gangrene, and peripheral artery disease. High Risk Factors: Hypertension, hyperlipidemia, Diabetes, current smoker.  Vascular Interventions: Right leg angio and stent placed 02/25/19. Comparison Study: No previous exams Performing Technologist: Clint GuyGibson, Lisa RVT  Examination Guidelines: A complete evaluation includes at minimum, Doppler waveform signals and systolic blood pressure reading at the level of bilateral brachial, anterior tibial, and posterior tibial arteries, when vessel segments are accessible. Bilateral testing is considered an integral part of a complete examination. Photoelectric Plethysmograph (PPG) waveforms and toe systolic pressure readings are included as required and additional duplex testing as needed. Limited examinations for reoccurring indications may be performed as noted.  ABI Findings: +--------+------------------+-----+-------------------+--------+ Right   Rt Pressure (mmHg)IndexWaveform           Comment  +--------+------------------+-----+-------------------+--------+ [id number]                    triphasic                   +--------+------------------+-----+-------------------+--------+ PTA     74                0.59 dampened monophasic          +--------+------------------+-----+-------------------+--------+ DP      78                0.62 dampened monophasic         +--------+------------------+-----+-------------------+--------+ +--------+------------------+-----+----------+-------+ Left    Lt Pressure (mmHg)IndexWaveform  Comment +--------+------------------+-----+----------+-------+ [id number]                    triphasic         +--------+------------------+-----+----------+-------+ PTA     74                0.59 monophasic        +--------+------------------+-----+----------+-------+ DP      79                0.63 monophasic        +--------+------------------+-----+----------+-------+ +-------+-----------+-----------+------------+------------+ ABI/TBIToday's ABIToday's TBIPrevious ABIPrevious TBI +-------+-----------+-----------+------------+------------+ Right  0.62                                           +-------+-----------+-----------+------------+------------+ Left   0.63                                           +-------+-----------+-----------+------------+------------+  Summary: Right: Resting right ankle-brachial index indicates moderate right lower extremity arterial disease. Left: Resting left ankle-brachial index indicates moderate left lower extremity arterial disease.  *See table(s) above for measurements and observations.  Electronically signed by Waverly Ferrarihristopher Dickson MD on 03/12/2020 at 3:31:47 PM.    Final         Scheduled Meds: . atorvastatin  80 mg Oral Daily  . buPROPion  150 mg Oral BID  . carvedilol  20 mg Oral Daily  . enoxaparin (LOVENOX) injection  40 mg Subcutaneous Q24H  . gabapentin  300 mg Oral BID  .  insulin aspart  0-15 Units Subcutaneous TID WC  . insulin glargine  8 Units Subcutaneous Daily  . lipase/protease/amylase  36,000 Units Oral TID AC  . losartan  50 mg Oral Daily  . pantoprazole  40 mg Oral Daily   Continuous Infusions: . sodium chloride  Stopped (03/13/20 0815)  . lactated ringers    . piperacillin-tazobactam (ZOSYN)  IV 3.375 g (03/13/20 0544)  . vancomycin 1,250 mg (03/13/20 0147)     LOS: 1 day    Time spent: 35 minutes    Dorcas Carrow, MD [facility] Pager [phone]

## 2020-03-13 NOTE — Anesthesia Procedure Notes (Addendum)
Procedure Name: LMA Insertion Date/Time: 03/13/2020 7:58 AM Performed by: Dorie Rank, CRNA Pre-anesthesia Checklist: Patient identified, Emergency Drugs available, Suction available, Patient being monitored and Timeout performed Patient Re-evaluated:Patient Re-evaluated prior to induction Oxygen Delivery Method: Circle system utilized Preoxygenation: Pre-oxygenation with 100% oxygen Induction Type: IV induction Ventilation: Mask ventilation without difficulty LMA: LMA inserted LMA Size: 4.0 Number of attempts: 1 Placement Confirmation: ETT inserted through vocal cords under direct vision,  positive ETCO2 and breath sounds checked- equal and bilateral Tube secured with: Tape Dental Injury: Teeth and Oropharynx as per pre-operative assessment

## 2020-03-13 NOTE — Transfer of Care (Signed)
Immediate Anesthesia Transfer of Care Note  Patient: Ricardo Howard  Procedure(s) Performed: TRANSMETATARSAL AMPUTATION (Right Foot)  Patient Location: PACU  Anesthesia Type:General  Level of Consciousness: awake, alert  and sedated  Airway & Oxygen Therapy: Patient connected to face mask oxygen  Post-op Assessment: Post -op Vital signs reviewed and stable  Post vital signs: stable  Last Vitals:  Vitals Value Taken Time  BP    Temp    Pulse 92 03/13/20 0829  Resp    SpO2 97 % 03/13/20 0829  Vitals shown include unvalidated device data.  Last Pain:  Vitals:   03/13/20 0435  TempSrc: Oral  PainSc:          Complications: No complications documented.

## 2020-03-13 NOTE — Op Note (Signed)
    NAME: Ricardo Howard    MRN: [id number] DOB: 1960-07-02    DATE OF OPERATION: 03/13/2020  PREOP DIAGNOSIS:    Gangrene right foot  POSTOP DIAGNOSIS:    Same  PROCEDURE:    Open transmetatarsal amputation right foot  SURGEON: Di Kindle. Edilia Bo, MD  ASSIST: None  ANESTHESIA: General  EBL: Minimal  INDICATIONS:    [name] is a 60 y.o. male who did undergone a SFA angioplasty in [place] 2 weeks ago.  This was for dry gangrene of the second toe.  He presented with a foul-smelling forefoot.  He presents for open transmetatarsal amputation.  FINDINGS:   There was extensive necrotic tissue throughout the entire plantar aspect of the foot.  I did an open transmetatarsal amputation.  Cultures were sent.  He will need a below the knee amputation.  This is clearly not salvageable.  TECHNIQUE:   The patient was taken to the operating room and received a general anesthetic.  The right lower extremity was prepped and draped in usual sterile fashion.  A fishmouth incision was made encompassing the entire forefoot and the dissection carried down to the metatarsal bones.  Each of the metatarsals was individually divided with the CD4 saw.  There was no significant bleeding.  There was extensive devitalized tissue.  Intraoperative cultures were sent.  I debrided the devitalized tissue but this extended way back on the forefoot.  The patient will clearly need an below the knee amputation once the infection is under better control.  Once the wound was debrided this was packed with moist 4 x 4's and wrapped with a Kerlix and a 4 inch Ace.  He was transferred to the recovery room in stable condition.  All needle and sponge counts were correct.   Waverly Ferrari, MD, FACS [facility]  DATE OF DICTATION:   03/13/2020

## 2020-03-13 NOTE — Anesthesia Procedure Notes (Addendum)
Anesthesia Regional Block: Popliteal block   Pre-Anesthetic Checklist: ,, timeout performed, Correct Patient, Correct Site, Correct Laterality, Correct Procedure, Correct Position, site marked, Risks and benefits discussed,  Surgical consent,  Pre-op evaluation,  At surgeon's request and post-op pain management  Laterality: Right  Prep: chloraprep       Needles:  Injection technique: Single-shot  Needle Type: Echogenic Needle     Needle Length: 9cm  Needle Gauge: 21     Additional Needles:   Procedures:,,,, ultrasound used (permanent image in chart),,,,  Narrative:  Start time: 03/13/2020 7:20 AM End time: 03/13/2020 7:25 AM Injection made incrementally with aspirations every 5 mL.  Performed by: Personally  Anesthesiologist: Marcene Duos, MD

## 2020-03-13 NOTE — Interval H&P Note (Signed)
History and Physical Interval Note:  03/13/2020 7:12 AM  [name]  has presented today for surgery, with the diagnosis of gangrene right foot.  The various methods of treatment have been discussed with the patient and family. After consideration of risks, benefits and other options for treatment, the patient has consented to  Procedure(s): TRANSMETATARSAL AMPUTATION (Right) as a surgical intervention.  The patient's history has been reviewed, patient examined, no change in status, stable for surgery.  I have reviewed the patient's chart and labs.  Questions were answered to the patient's satisfaction.     Ricardo Howard

## 2020-03-14 ENCOUNTER — Encounter (HOSPITAL_COMMUNITY): Payer: Self-pay | Admitting: Vascular Surgery

## 2020-03-14 DIAGNOSIS — I96 Gangrene, not elsewhere classified: Secondary | ICD-10-CM | POA: Diagnosis not present

## 2020-03-14 LAB — CBC WITH DIFFERENTIAL/PLATELET
Abs Immature Granulocytes: 0.31 10*3/uL — ABNORMAL HIGH (ref 0.00–0.07)
Basophils Absolute: 0 10*3/uL (ref 0.0–0.1)
Basophils Relative: 0 %
Eosinophils Absolute: 0.1 10*3/uL (ref 0.0–0.5)
Eosinophils Relative: 0 %
HCT: 35.2 % — ABNORMAL LOW (ref 39.0–52.0)
Hemoglobin: 11.6 g/dL — ABNORMAL LOW (ref 13.0–17.0)
Immature Granulocytes: 2 %
Lymphocytes Relative: 9 %
Lymphs Abs: 1.7 10*3/uL (ref 0.7–4.0)
MCH: 30.6 pg (ref 26.0–34.0)
MCHC: 33 g/dL (ref 30.0–36.0)
MCV: 92.9 fL (ref 80.0–100.0)
Monocytes Absolute: 1.4 10*3/uL — ABNORMAL HIGH (ref 0.1–1.0)
Monocytes Relative: 8 %
Neutro Abs: 14.7 10*3/uL — ABNORMAL HIGH (ref 1.7–7.7)
Neutrophils Relative %: 81 %
Platelets: 342 10*3/uL (ref 150–400)
RBC: 3.79 MIL/uL — ABNORMAL LOW (ref 4.22–5.81)
RDW: 14.2 % (ref 11.5–15.5)
WBC: 18.2 10*3/uL — ABNORMAL HIGH (ref 4.0–10.5)
nRBC: 0 % (ref 0.0–0.2)

## 2020-03-14 LAB — GLUCOSE, CAPILLARY
Glucose-Capillary: 197 mg/dL — ABNORMAL HIGH (ref 70–99)
Glucose-Capillary: 240 mg/dL — ABNORMAL HIGH (ref 70–99)
Glucose-Capillary: 288 mg/dL — ABNORMAL HIGH (ref 70–99)
Glucose-Capillary: 188 mg/dL — ABNORMAL HIGH (ref 70–99)

## 2020-03-14 LAB — MAGNESIUM: Magnesium: 1.6 mg/dL — ABNORMAL LOW (ref 1.7–2.4)

## 2020-03-14 LAB — BASIC METABOLIC PANEL
Anion gap: 11 (ref 5–15)
BUN: 6 mg/dL (ref 6–20)
CO2: 20 mmol/L — ABNORMAL LOW (ref 22–32)
Calcium: 7.6 mg/dL — ABNORMAL LOW (ref 8.9–10.3)
Chloride: 104 mmol/L (ref 98–111)
Creatinine, Ser: 0.68 mg/dL (ref 0.61–1.24)
GFR, Estimated: >60 mL/min (ref >60)
Glucose, Bld: 211 mg/dL — ABNORMAL HIGH (ref 70–99)
Potassium: 3.3 mmol/L — ABNORMAL LOW (ref 3.5–5.1)
Sodium: 135 mmol/L (ref 135–145)

## 2020-03-14 MED ORDER — MAGNESIUM OXIDE 400 (241.3 MG) MG PO TABS
400.0000 mg | ORAL_TABLET | Freq: Two times a day (BID) | ORAL | Status: DC
  Administered 2020-03-14 – 2020-03-19 (×11): 400 mg via ORAL
  Filled 2020-03-14 (×12): qty 1

## 2020-03-14 MED ORDER — POTASSIUM CHLORIDE CRYS ER 20 MEQ PO TBCR
40.0000 meq | EXTENDED_RELEASE_TABLET | Freq: Two times a day (BID) | ORAL | Status: AC
  Administered 2020-03-14 – 2020-03-15 (×4): 40 meq via ORAL
  Filled 2020-03-14 (×4): qty 2

## 2020-03-14 NOTE — Progress Notes (Signed)
PROGRESS NOTE    Ricardo Howard  GGE:[id number] DOB: 1960-08-22 DOA: 03/12/2020 PCP: [facility]    Brief Narrative:  60 year old male with history of hypertension, uncontrolled type 2 diabetes, severe peripheral artery disease, ongoing tobacco use, alcohol use and chronic pancreatitis with recent admission to [facility] for gangrene of the right foot status post stenting presents to the hospital with increasing pain of the right foot.  In the emergency room, he was noted to have wet gangrene of the second right toe.  Surgery was consulted.  Was started on IV antibiotics.  He was taken to operating room for TMA 1/23, unhealthy tissue and plan for right BKA on 1/25.   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  Blood cultures negative so far. Surgical culture with mixed growth.  Hemodynamically stabilizing. Status post metatarsal amputation, extensive necrotic tissue.  Scheduled for right below-knee amputation 1/25. Continue antibiotics until surgical amputation to the healthy tissue.  # Uncontrolled type 2 diabetes with hyperglycemia: Noncompliant on insulin.  A1c more than 11.  Resumed home dose of insulin with long-acting and prandial insulin.  We will continue to uptitrate.  He is going to be n.p.o.  Will keep on current doses today.  # Hypertension: Blood pressure stable.  Resume carvedilol and Cozaar.  # Smoker: Counseled to quit.  Nicotine patch  # Alcohol use disorder: Patient drinks about 6 packs of beer a day.  Can go without drinking.  Will be closely monitored for withdrawal..   Stable so far.   DVT prophylaxis: enoxaparin (LOVENOX) injection 40 mg Start: 03/14/20 1000   Code Status: Full code Family Communication: Wife at the bedside Disposition Plan: Status is: Inpatient  Remains inpatient appropriate because:Inpatient level of care appropriate due to severity of illness   Dispo: The patient is from: Home              Anticipated d/c is to: CIR versus home with home health PT.              Anticipated d/c date is: > 3 days              Patient currently is not medically stable to d/c.   Difficult to place patient No         Consultants:   Vascular surgery  Procedures:   Right transmetatarsal amputation  Antimicrobials:  Antibiotics Given (last 72 hours)    Date/Time Action Medication Dose Rate   03/12/20 1049 New Bag/Given   cefTRIAXone (ROCEPHIN) 2 g in sodium chloride 0.9 % 100 mL IVPB 2 g 200 mL/hr   03/12/20 1129 New Bag/Given   vancomycin (VANCOREADY) IVPB 1750 mg/350 mL 1,750 mg 175 mL/hr   03/12/20 2156 New Bag/Given   piperacillin-tazobactam (ZOSYN) IVPB 3.375 g 3.375 g 12.5 mL/hr   03/13/20 0147 New Bag/Given   vancomycin (VANCOREADY) IVPB 1250 mg/250 mL 1,250 mg 166.7 mL/hr   03/13/20 0544 New Bag/Given   piperacillin-tazobactam (ZOSYN) IVPB 3.375 g 3.375 g 12.5 mL/hr   03/13/20 0742 Given   piperacillin-tazobactam (ZOSYN) IVPB 3.375 g 3.375 g    03/13/20 1418 New Bag/Given   vancomycin (VANCOREADY)  IVPB 1250 mg/250 mL 1,250 mg 166.7 mL/hr   03/13/20 1719 New Bag/Given   piperacillin-tazobactam (ZOSYN) IVPB 3.375 g 3.375 g 12.5 mL/hr   03/13/20 2159 New Bag/Given   piperacillin-tazobactam (ZOSYN) IVPB 3.375 g 3.375 g 12.5 mL/hr   03/14/20 0050 New Bag/Given   vancomycin (VANCOREADY) IVPB 1250 mg/250 mL 1,250 mg 166.7 mL/hr   03/14/20 [id number] New Bag/Given   piperacillin-tazobactam (ZOSYN) IVPB 3.375 g 3.375 g 12.5 mL/hr   03/14/20 1302 New Bag/Given   vancomycin (VANCOREADY) IVPB 1250 mg/250 mL 1,250 mg 166.7 mL/hr    03/14/20 1303 New Bag/Given   piperacillin-tazobactam (ZOSYN) IVPB 3.375 g 3.375 g 12.5 mL/hr         Subjective: Patient seen and examined.  Wife at the bedside.  Denies any complaints.  He has tingling of the left leg also.  He promised to be compliant to insulin and wants to quit smoking. Scheduled for surgery tomorrow. Objective: Vitals:   03/13/20 1902 03/13/20 2327 03/14/20 0512 03/14/20 1247  BP:  (!) 141/85 (!) 148/87 122/82  Pulse:  80 90 89  Resp:  18 20 16   Temp: 99.7 F (37.6 C) 98.6 F (37 C) 98.6 F (37 C) 99.3 F (37.4 C)  TempSrc: Oral Oral Oral Oral  SpO2:  91% 94% 95%  Weight:      Height:        Intake/Output Summary (Last 24 hours) at 03/14/2020 1340 Last data filed at 03/14/2020 0513 Gross per 24 hour  Intake 240 ml  Output 750 ml  Net -510 ml   Filed Weights   03/12/20 0906 03/12/20 2048  Weight: 84.8 kg 87.1 kg    Examination:  General exam: Appears calm and comfortable  Respiratory system: Clear to auscultation. Respiratory effort normal. Cardiovascular system: S1 & S2 heard, RRR. No JVD, murmurs, rubs, gallops or clicks.  Gastrointestinal system: Abdomen is nondistended, soft and nontender. No organomegaly or masses felt. Normal bowel sounds heard. Central nervous system: Alert and oriented. No focal neurological deficits. Extremities: Symmetric 5 x 5 power. Skin: No rashes, lesions or ulcers Psychiatry: Judgement and insight appear normal. Mood & affect appropriate.  Right foot with postop dressing.  Not removed by me. Pictures in the chart.   Data Reviewed: I have personally reviewed following labs and imaging studies  CBC: Recent Labs  Lab 03/12/20 0956 03/13/20 0310 03/14/20 0846  WBC 20.1* 19.6* 18.2*  NEUTROABS 17.4*  --  14.7*  HGB 12.6* 11.9* 11.6*  HCT 38.8* 34.9* 35.2*  MCV 93.9 90.6 92.9  PLT 287 325 342   Basic Metabolic Panel: Recent Labs  Lab 03/12/20 0956 03/13/20 0310 03/14/20 0846  NA 135 136 135  K 4.7  4.1 3.3*  CL 100 104 104  CO2 22 21* 20*  GLUCOSE 433* 267* 211*  BUN 11 10 6   CREATININE 0.74 0.67 0.68  CALCIUM 9.2 8.2* 7.6*  MG  --   --  1.6*   GFR: Estimated Creatinine Clearance: 105.4 mL/min (by C-G formula based on SCr of 0.68 mg/dL). Liver Function Tests: No results for input(s): AST, ALT, ALKPHOS, BILITOT, PROT, ALBUMIN in the last 168 hours. No results for input(s): LIPASE, AMYLASE in the last 168 hours. No results for input(s): AMMONIA in the last 168 hours. Coagulation Profile: No results for input(s): INR, PROTIME in the last 168 hours. Cardiac Enzymes: No results for input(s): CKTOTAL, CKMB, CKMBINDEX, TROPONINI in the last 168 hours. BNP (last 3 results) No results for input(s): PROBNP in the  last 8760 hours. HbA1C: Recent Labs    03/12/20 2055  HGBA1C 11.4*   CBG: Recent Labs  Lab 03/13/20 1601 03/13/20 1653 03/13/20 2058 03/14/20 0616 03/14/20 1056  GLUCAP 189* 197* 182* 197* 240*   Lipid Profile: No results for input(s): CHOL, HDL, LDLCALC, TRIG, CHOLHDL, LDLDIRECT in the last 72 hours. Thyroid Function Tests: No results for input(s): TSH, T4TOTAL, FREET4, T3FREE, THYROIDAB in the last 72 hours. Anemia Panel: No results for input(s): VITAMINB12, FOLATE, FERRITIN, TIBC, IRON, RETICCTPCT in the last 72 hours. Sepsis Labs: Recent Labs  Lab 03/12/20 0956 03/12/20 1342  LATICACIDVEN 2.8* 1.5    Recent Results (from the past 240 hour(s))  SARS CORONAVIRUS 2 (TAT 6-24 HRS) Nasopharyngeal Nasopharyngeal Swab     Status: None   Collection Time: 03/12/20  9:35 AM   Specimen: Nasopharyngeal Swab  Result Value Ref Range Status   SARS Coronavirus 2 NEGATIVE NEGATIVE Final    Comment: (NOTE) SARS-CoV-2 target nucleic acids are NOT DETECTED.  The SARS-CoV-2 RNA is generally detectable in upper and lower respiratory specimens during the acute phase of infection. Negative results do not preclude SARS-CoV-2 infection, do not rule out co-infections with  other pathogens, and should not be used as the sole basis for treatment or other patient management decisions. Negative results must be combined with clinical observations, patient history, and epidemiological information. The expected result is Negative.  Fact Sheet for Patients: HairSlick.no  Fact Sheet for Healthcare Providers: quierodirigir.com  This test is not yet approved or cleared by the [place] FDA and  has been authorized for detection and/or diagnosis of SARS-CoV-2 by FDA under an Emergency Use Authorization (EUA). This EUA will remain  in effect (meaning this test can be used) for the duration of the COVID-19 declaration under Se ction 564(b)(1) of the Act, 21 U.S.C. section 360bbb-3(b)(1), unless the authorization is terminated or revoked sooner.  Performed at [facility], [address], [place], [place] [address]   Blood culture (routine x 2)     Status: None (Preliminary result)   Collection Time: 03/12/20  9:37 AM   Specimen: BLOOD  Result Value Ref Range Status   Specimen Description BLOOD LEFT ANTECUBITAL  Final   Special Requests   Final    BOTTLES DRAWN AEROBIC AND ANAEROBIC Blood Culture adequate volume   Culture   Final    NO GROWTH < 24 HOURS Performed at [facility], [address] [place], [place] [address]    Report Status PENDING  Incomplete  Blood culture (routine x 2)     Status: None (Preliminary result)   Collection Time: 03/12/20  8:55 PM   Specimen: BLOOD  Result Value Ref Range Status   Specimen Description BLOOD LEFT ANTECUBITAL  Final   Special Requests   Final    BOTTLES DRAWN AEROBIC ONLY Blood Culture adequate volume   Culture   Final    NO GROWTH < 12 HOURS Performed at [facility], [address], [place], [place] [address]    Report Status PENDING  Incomplete  Surgical pcr screen     Status: None   Collection Time: 03/12/20  9:04 PM   Specimen:  Nasal Mucosa; Nasal Swab  Result Value Ref Range Status   MRSA, PCR NEGATIVE NEGATIVE Final   Staphylococcus aureus NEGATIVE NEGATIVE Final    Comment: (NOTE) The Xpert SA Assay (FDA approved for NASAL specimens in patients 82 years of age and older), is one component of  a comprehensive surveillance program. It is not intended to diagnose infection nor to guide or monitor treatment. Performed at [facility], [address], [place], [place] [address]   Aerobic/Anaerobic Culture (surgical/deep wound)     Status: None (Preliminary result)   Collection Time: 03/13/20  8:07 AM   Specimen: Wound  Result Value Ref Range Status   Specimen Description WOUND  Final   Special Requests NONE  Final   Gram Stain   Final    NO WBC SEEN ABUNDANT GRAM NEGATIVE RODS MODERATE GRAM POSITIVE COCCI    Culture   Final    CULTURE REINCUBATED FOR BETTER GROWTH Performed at [facility], [address] [place], [place] [address]    Report Status PENDING  Incomplete         Radiology Studies: No results found.      Scheduled Meds: . atorvastatin  80 mg Oral Daily  . buPROPion  150 mg Oral BID  . carvedilol  20 mg Oral Daily  . enoxaparin (LOVENOX) injection  40 mg Subcutaneous Q24H  . gabapentin  300 mg Oral BID  . insulin aspart  0-15 Units Subcutaneous TID WC  . insulin aspart  4 Units Subcutaneous TID WC  . insulin glargine  8 Units Subcutaneous Daily  . lipase/protease/amylase  36,000 Units Oral TID AC  . losartan  50 mg Oral Daily  . magnesium oxide  400 mg Oral BID  . pantoprazole  40 mg Oral Daily  . potassium chloride  40 mEq Oral BID   Continuous Infusions: . sodium chloride 125 mL/hr at 03/14/20 0634  . lactated ringers    . piperacillin-tazobactam (ZOSYN)  IV 3.375 g (03/14/20 1303)  . vancomycin 1,250 mg (03/14/20 1302)     LOS: 2 days    Time spent: 25 minutes    Dorcas Carrow, MD [facility] Pager [phone]

## 2020-03-14 NOTE — Progress Notes (Signed)
   VASCULAR SURGERY ASSESSMENT & PLAN:   POD 1: OPEN TRANSMETATARSAL AMPUTATION RIGHT FOOT: This patient has a very extensive infection of the left foot and the foot is clearly not salvageable.  He is scheduled for a left below the knee amputation tomorrow.  I have discussed the indications for the procedure and the potential complications with the patient today and he is agreeable to proceed.  I have written preop orders.  SUBJECTIVE:   Pain under good control.  PHYSICAL EXAM:   Vitals:   03/13/20 1745 03/13/20 1902 03/13/20 2327 03/14/20 0512  BP: 133/88  (!) 141/85 (!) 148/87  Pulse: 92  80 90  Resp: 20  18 20  Temp: 100 F (37.8 C) 99.7 F (37.6 C) 98.6 F (37 C) 98.6 F (37 C)  TempSrc: Oral Oral Oral Oral  SpO2: 95%  91% 94%  Weight:      Height:       I changed his dressing this morning has extensive necrotic tissue in the left foot as documented below.       LABS:   Lab Results  Component Value Date   WBC 19.6 (H) 03/13/2020   HGB 11.9 (L) 03/13/2020   HCT 34.9 (L) 03/13/2020   MCV 90.6 03/13/2020   PLT 325 03/13/2020   Lab Results  Component Value Date   CREATININE 0.67 03/13/2020   Lab Results  Component Value Date   INR 1.1 10/07/2019   CBG (last 3)  Recent Labs    03/13/20 1653 03/13/20 2058 03/14/20 0616  GLUCAP 197* 182* 197*    PROBLEM LIST:    Principal Problem:   Gangrene of toe of right foot (HCC) Active Problems:   Alcohol abuse   Essential hypertension   Uncontrolled type 2 diabetes mellitus with diabetic polyneuropathy, with long-term current use of insulin (HCC)   CURRENT MEDS:   . atorvastatin  80 mg Oral Daily  . buPROPion  150 mg Oral BID  . carvedilol  20 mg Oral Daily  . enoxaparin (LOVENOX) injection  40 mg Subcutaneous Q24H  . gabapentin  300 mg Oral BID  . insulin aspart  0-15 Units Subcutaneous TID WC  . insulin aspart  4 Units Subcutaneous TID WC  . insulin glargine  8 Units Subcutaneous Daily  .  lipase/protease/amylase  36,000 Units Oral TID AC  . losartan  50 mg Oral Daily  . pantoprazole  40 mg Oral Daily    Ricardo Howard Office: [phone] 03/14/2020  

## 2020-03-14 NOTE — H&P (View-Only) (Signed)
   VASCULAR SURGERY ASSESSMENT & PLAN:   POD 1: OPEN TRANSMETATARSAL AMPUTATION RIGHT FOOT: This patient has a very extensive infection of the left foot and the foot is clearly not salvageable.  He is scheduled for a left below the knee amputation tomorrow.  I have discussed the indications for the procedure and the potential complications with the patient today and he is agreeable to proceed.  I have written preop orders.  SUBJECTIVE:   Pain under good control.  PHYSICAL EXAM:   Vitals:   03/13/20 1745 03/13/20 1902 03/13/20 2327 03/14/20 0512  BP: 133/88  (!) 141/85 (!) 148/87  Pulse: 92  80 90  Resp: 20  18 20   Temp: 100 F (37.8 C) 99.7 F (37.6 C) 98.6 F (37 C) 98.6 F (37 C)  TempSrc: Oral Oral Oral Oral  SpO2: 95%  91% 94%  Weight:      Height:       I changed his dressing this morning has extensive necrotic tissue in the left foot as documented below.       LABS:   Lab Results  Component Value Date   WBC 19.6 (H) 03/13/2020   HGB 11.9 (L) 03/13/2020   HCT 34.9 (L) 03/13/2020   MCV 90.6 03/13/2020   PLT 325 03/13/2020   Lab Results  Component Value Date   CREATININE 0.67 03/13/2020   Lab Results  Component Value Date   INR 1.1 10/07/2019   CBG (last 3)  Recent Labs    03/13/20 1653 03/13/20 2058 03/14/20 0616  GLUCAP 197* 182* 197*    PROBLEM LIST:    Principal Problem:   Gangrene of toe of right foot (HCC) Active Problems:   Alcohol abuse   Essential hypertension   Uncontrolled type 2 diabetes mellitus with diabetic polyneuropathy, with long-term current use of insulin (HCC)   CURRENT MEDS:   . atorvastatin  80 mg Oral Daily  . buPROPion  150 mg Oral BID  . carvedilol  20 mg Oral Daily  . enoxaparin (LOVENOX) injection  40 mg Subcutaneous Q24H  . gabapentin  300 mg Oral BID  . insulin aspart  0-15 Units Subcutaneous TID WC  . insulin aspart  4 Units Subcutaneous TID WC  . insulin glargine  8 Units Subcutaneous Daily  .  lipase/protease/amylase  36,000 Units Oral TID AC  . losartan  50 mg Oral Daily  . pantoprazole  40 mg Oral Daily    03/16/20 Office: [phone] 03/14/2020

## 2020-03-15 ENCOUNTER — Encounter (HOSPITAL_COMMUNITY)
Admission: EM | Disposition: A | Discharge status: Rehabilitation Facility | Payer: Self-pay | Source: Home / Self Care | Attending: Internal Medicine

## 2020-03-15 ENCOUNTER — Inpatient Hospital Stay (HOSPITAL_COMMUNITY): Payer: No Typology Code available for payment source | Admitting: Anesthesiology

## 2020-03-15 ENCOUNTER — Encounter (HOSPITAL_COMMUNITY): Payer: Self-pay | Admitting: Internal Medicine

## 2020-03-15 DIAGNOSIS — I96 Gangrene, not elsewhere classified: Secondary | ICD-10-CM

## 2020-03-15 HISTORY — PX: AMPUTATION: SHX166

## 2020-03-15 LAB — BASIC METABOLIC PANEL
Anion gap: 12 (ref 5–15)
BUN: 6 mg/dL (ref 6–20)
CO2: 18 mmol/L — ABNORMAL LOW (ref 22–32)
Calcium: 7.6 mg/dL — ABNORMAL LOW (ref 8.9–10.3)
Chloride: 106 mmol/L (ref 98–111)
Creatinine, Ser: 0.56 mg/dL — ABNORMAL LOW (ref 0.61–1.24)
GFR, Estimated: >60 mL/min (ref >60)
Glucose, Bld: 187 mg/dL — ABNORMAL HIGH (ref 70–99)
Potassium: 3.9 mmol/L (ref 3.5–5.1)
Sodium: 136 mmol/L (ref 135–145)

## 2020-03-15 LAB — CBC WITH DIFFERENTIAL/PLATELET
Abs Immature Granulocytes: 0.45 10*3/uL — ABNORMAL HIGH (ref 0.00–0.07)
Basophils Absolute: 0.1 10*3/uL (ref 0.0–0.1)
Basophils Relative: 0 %
Eosinophils Absolute: 0.2 10*3/uL (ref 0.0–0.5)
Eosinophils Relative: 1 %
HCT: 38.1 % — ABNORMAL LOW (ref 39.0–52.0)
Hemoglobin: 12.4 g/dL — ABNORMAL LOW (ref 13.0–17.0)
Immature Granulocytes: 3 %
Lymphocytes Relative: 12 %
Lymphs Abs: 2 10*3/uL (ref 0.7–4.0)
MCH: 30.2 pg (ref 26.0–34.0)
MCHC: 32.5 g/dL (ref 30.0–36.0)
MCV: 92.7 fL (ref 80.0–100.0)
Monocytes Absolute: 1.5 10*3/uL — ABNORMAL HIGH (ref 0.1–1.0)
Monocytes Relative: 9 %
Neutro Abs: 12.6 10*3/uL — ABNORMAL HIGH (ref 1.7–7.7)
Neutrophils Relative %: 75 %
Platelets: 351 10*3/uL (ref 150–400)
RBC: 4.11 MIL/uL — ABNORMAL LOW (ref 4.22–5.81)
RDW: 14.2 % (ref 11.5–15.5)
WBC: 16.8 10*3/uL — ABNORMAL HIGH (ref 4.0–10.5)
nRBC: 0 % (ref 0.0–0.2)

## 2020-03-15 LAB — GLUCOSE, CAPILLARY
Glucose-Capillary: 178 mg/dL — ABNORMAL HIGH (ref 70–99)
Glucose-Capillary: 162 mg/dL — ABNORMAL HIGH (ref 70–99)
Glucose-Capillary: 162 mg/dL — ABNORMAL HIGH (ref 70–99)
Glucose-Capillary: 268 mg/dL — ABNORMAL HIGH (ref 70–99)
Glucose-Capillary: 270 mg/dL — ABNORMAL HIGH (ref 70–99)

## 2020-03-15 SURGERY — AMPUTATION BELOW KNEE
Anesthesia: General | Site: Knee | Laterality: Right

## 2020-03-15 MED ORDER — FENTANYL CITRATE (PF) 100 MCG/2ML IJ SOLN: 100.0000 ug | Freq: Once | INTRAMUSCULAR | Status: AC

## 2020-03-15 MED ORDER — SUFENTANIL CITRATE 50 MCG/ML IV SOLN
INTRAVENOUS | Status: AC
  Filled 2020-03-15: qty 1

## 2020-03-15 MED ORDER — GLYCOPYRROLATE PF 0.2 MG/ML IJ SOSY
PREFILLED_SYRINGE | INTRAMUSCULAR | Status: AC
  Filled 2020-03-15: qty 1

## 2020-03-15 MED ORDER — MIDAZOLAM HCL 2 MG/2ML IJ SOLN: 2.0000 mg | Freq: Once | INTRAMUSCULAR | Status: AC

## 2020-03-15 MED ORDER — BUPIVACAINE HCL (PF) 0.25 % IJ SOLN
INTRAMUSCULAR | Status: DC | PRN
  Administered 2020-03-15 (×2): 15 mL

## 2020-03-15 MED ORDER — ONDANSETRON HCL 4 MG/2ML IJ SOLN
INTRAMUSCULAR | Status: DC | PRN
  Administered 2020-03-15: 4 mg via INTRAVENOUS

## 2020-03-15 MED ORDER — AMISULPRIDE (ANTIEMETIC) 5 MG/2ML IV SOLN: 10.0000 mg | Freq: Once | INTRAVENOUS | Status: DC | PRN

## 2020-03-15 MED ORDER — CHLORHEXIDINE GLUCONATE 0.12 % MT SOLN
OROMUCOSAL | Status: AC
  Administered 2020-03-15: 15 mL
  Filled 2020-03-15: qty 15

## 2020-03-15 MED ORDER — FENTANYL CITRATE (PF) 100 MCG/2ML IJ SOLN: 25.0000 ug | INTRAMUSCULAR | Status: DC | PRN

## 2020-03-15 MED ORDER — BUPIVACAINE LIPOSOME 1.3 % IJ SUSP
INTRAMUSCULAR | Status: DC | PRN
  Administered 2020-03-15: 10 mL via PERINEURAL

## 2020-03-15 MED ORDER — CELECOXIB 200 MG PO CAPS
200.0000 mg | ORAL_CAPSULE | Freq: Once | ORAL | Status: AC
  Administered 2020-03-15: 200 mg via ORAL
  Filled 2020-03-15 (×2): qty 1

## 2020-03-15 MED ORDER — 0.9 % SODIUM CHLORIDE (POUR BTL) OPTIME
TOPICAL | Status: DC | PRN
  Administered 2020-03-15: 1000 mL

## 2020-03-15 MED ORDER — SODIUM CHLORIDE (PF) 0.9 % IJ SOLN
INTRAMUSCULAR | Status: AC
  Filled 2020-03-15: qty 10

## 2020-03-15 MED ORDER — PHENYLEPHRINE 40 MCG/ML (10ML) SYRINGE FOR IV PUSH (FOR BLOOD PRESSURE SUPPORT)
PREFILLED_SYRINGE | INTRAVENOUS | Status: AC
  Filled 2020-03-15: qty 10

## 2020-03-15 MED ORDER — STERILE WATER FOR IRRIGATION IR SOLN
Status: DC | PRN
  Administered 2020-03-15: 1000 mL

## 2020-03-15 MED ORDER — ONDANSETRON HCL 4 MG/2ML IJ SOLN
INTRAMUSCULAR | Status: AC
  Filled 2020-03-15: qty 2

## 2020-03-15 MED ORDER — ONDANSETRON HCL 4 MG/2ML IJ SOLN
4.0000 mg | Freq: Four times a day (QID) | INTRAMUSCULAR | Status: DC | PRN
  Administered 2020-03-15: 4 mg via INTRAVENOUS
  Filled 2020-03-15: qty 2

## 2020-03-15 MED ORDER — BACITRACIN ZINC 500 UNIT/GM EX OINT
TOPICAL_OINTMENT | CUTANEOUS | Status: AC
  Filled 2020-03-15: qty 28.35

## 2020-03-15 MED ORDER — PHENYLEPHRINE 40 MCG/ML (10ML) SYRINGE FOR IV PUSH (FOR BLOOD PRESSURE SUPPORT)
PREFILLED_SYRINGE | INTRAVENOUS | Status: DC | PRN
  Administered 2020-03-15: 40 ug via INTRAVENOUS
  Administered 2020-03-15: 80 ug via INTRAVENOUS

## 2020-03-15 MED ORDER — ACETAMINOPHEN 500 MG PO TABS
1000.0000 mg | ORAL_TABLET | Freq: Once | ORAL | Status: AC
  Administered 2020-03-15: 1000 mg via ORAL

## 2020-03-15 MED ORDER — PROPOFOL 10 MG/ML IV BOLUS
INTRAVENOUS | Status: DC | PRN
  Administered 2020-03-15: 100 mg via INTRAVENOUS

## 2020-03-15 MED ORDER — MIDAZOLAM HCL 2 MG/2ML IJ SOLN
INTRAMUSCULAR | Status: AC
  Filled 2020-03-15: qty 2

## 2020-03-15 MED ORDER — BACITRACIN ZINC 500 UNIT/GM EX OINT
TOPICAL_OINTMENT | CUTANEOUS | Status: DC | PRN
  Administered 2020-03-15: 1 via TOPICAL

## 2020-03-15 MED ORDER — PROPOFOL 10 MG/ML IV BOLUS
INTRAVENOUS | Status: AC
  Filled 2020-03-15: qty 20

## 2020-03-15 MED ORDER — GLYCOPYRROLATE 0.2 MG/ML IJ SOLN
INTRAMUSCULAR | Status: DC | PRN
  Administered 2020-03-15: .2 mg via INTRAVENOUS

## 2020-03-15 MED ORDER — MIDAZOLAM HCL 2 MG/2ML IJ SOLN
INTRAMUSCULAR | Status: AC
  Administered 2020-03-15: 2 mg via INTRAVENOUS
  Filled 2020-03-15: qty 2

## 2020-03-15 MED ORDER — FENTANYL CITRATE (PF) 100 MCG/2ML IJ SOLN
INTRAMUSCULAR | Status: AC
  Administered 2020-03-15: 100 ug via INTRAVENOUS
  Filled 2020-03-15: qty 2

## 2020-03-15 MED ORDER — LACTATED RINGERS IV SOLN: INTRAVENOUS | Status: DC | PRN

## 2020-03-15 SURGICAL SUPPLY — 51 items
BANDAGE ESMARK 6X9 LF (GAUZE/BANDAGES/DRESSINGS) ×1 IMPLANT
BLADE SAW RECIP 87.9 MT (BLADE) ×2 IMPLANT
BNDG COHESIVE 6X5 TAN STRL LF (GAUZE/BANDAGES/DRESSINGS) ×2 IMPLANT
BNDG ELASTIC 4X5.8 VLCR NS LF (GAUZE/BANDAGES/DRESSINGS) ×1 IMPLANT
BNDG ELASTIC 4X5.8 VLCR STR LF (GAUZE/BANDAGES/DRESSINGS) ×4 IMPLANT
BNDG ESMARK 6X9 LF (GAUZE/BANDAGES/DRESSINGS) ×2
BNDG GAUZE ELAST 4 BULKY (GAUZE/BANDAGES/DRESSINGS) ×3 IMPLANT
CANISTER SUCT 3000ML PPV (MISCELLANEOUS) ×4 IMPLANT
CLIP VESOCCLUDE MED 6/CT (CLIP) IMPLANT
COVER SURGICAL LIGHT HANDLE (MISCELLANEOUS) ×2 IMPLANT
COVER WAND RF STERILE (DRAPES) ×2 IMPLANT
CUFF TOURN SGL QUICK 24 (TOURNIQUET CUFF)
CUFF TOURN SGL QUICK 34 (TOURNIQUET CUFF)
CUFF TOURN SGL QUICK 42 (TOURNIQUET CUFF) IMPLANT
CUFF TRNQT CYL 24X4X16.5-23 (TOURNIQUET CUFF) IMPLANT
CUFF TRNQT CYL 34X4.125X (TOURNIQUET CUFF) IMPLANT
DRAIN CHANNEL 19F RND (DRAIN) IMPLANT
DRAPE HALF SHEET 40X57 (DRAPES) ×2 IMPLANT
DRAPE ORTHO SPLIT 77X108 STRL (DRAPES) ×2
DRAPE SURG ORHT 6 SPLT 77X108 (DRAPES) ×2 IMPLANT
DRAPE U-SHAPE 47X51 STRL (DRAPES) ×2 IMPLANT
DRSG ADAPTIC 3X8 NADH LF (GAUZE/BANDAGES/DRESSINGS) ×2 IMPLANT
ELECT REM PT RETURN 9FT ADLT (ELECTROSURGICAL) ×2
ELECTRODE REM PT RTRN 9FT ADLT (ELECTROSURGICAL) ×1 IMPLANT
EVACUATOR SILICONE 100CC (DRAIN) IMPLANT
GAUZE SPONGE 4X4 12PLY STRL (GAUZE/BANDAGES/DRESSINGS) ×3 IMPLANT
GLOVE BIO SURGEON STRL SZ7.5 (GLOVE) ×2 IMPLANT
GLOVE SRG 8 PF TXTR STRL LF DI (GLOVE) ×1 IMPLANT
GLOVE SURG UNDER POLY LF SZ8 (GLOVE) ×1
GOWN STRL REUS W/ TWL LRG LVL3 (GOWN DISPOSABLE) ×3 IMPLANT
GOWN STRL REUS W/TWL LRG LVL3 (GOWN DISPOSABLE) ×3
KIT BASIN OR (CUSTOM PROCEDURE TRAY) ×2 IMPLANT
KIT TURNOVER KIT B (KITS) ×2 IMPLANT
NS IRRIG 1000ML POUR BTL (IV SOLUTION) ×2 IMPLANT
PACK GENERAL/GYN (CUSTOM PROCEDURE TRAY) ×2 IMPLANT
PAD ARMBOARD 7.5X6 YLW CONV (MISCELLANEOUS) ×4 IMPLANT
RASP HELIOCORDIAL MED (MISCELLANEOUS) IMPLANT
STAPLER VISISTAT (STAPLE) ×2 IMPLANT
STOCKINETTE IMPERVIOUS LG (DRAPES) ×2 IMPLANT
SUT ETHILON 3 0 PS 1 (SUTURE) IMPLANT
SUT SILK 0 TIES 10X30 (SUTURE) ×3 IMPLANT
SUT SILK 2 0 (SUTURE) ×2
SUT SILK 2 0 SH CR/8 (SUTURE) ×3 IMPLANT
SUT SILK 2-0 18XBRD TIE 12 (SUTURE) ×1 IMPLANT
SUT SILK 3 0 (SUTURE) ×1
SUT SILK 3-0 18XBRD TIE 12 (SUTURE) ×1 IMPLANT
SUT VIC AB 2-0 CT1 18 (SUTURE) ×3 IMPLANT
SYR BULB IRRIG 60ML STRL (SYRINGE) ×1 IMPLANT
TOWEL GREEN STERILE (TOWEL DISPOSABLE) ×4 IMPLANT
UNDERPAD 30X36 HEAVY ABSORB (UNDERPADS AND DIAPERS) ×2 IMPLANT
WATER STERILE IRR 1000ML POUR (IV SOLUTION) ×2 IMPLANT

## 2020-03-15 NOTE — Anesthesia Preprocedure Evaluation (Addendum)
Anesthesia Evaluation  Patient identified by MRN, date of birth, ID band Patient awake    Reviewed: Allergy & Precautions, NPO status , Patient's Chart, lab work & pertinent test results  History of Anesthesia Complications Negative for: history of anesthetic complications  Airway Mallampati: II  TM Distance: >3 FB Neck ROM: Full    Dental no notable dental hx. (+) Dental Advisory Given   Pulmonary Current Smoker,    Pulmonary exam normal        Cardiovascular hypertension, Pt. on medications  Rhythm:Regular Rate:Tachycardia     Neuro/Psych  Neuromuscular disease    GI/Hepatic negative GI ROS, Neg liver ROS,   Endo/Other  diabetes, Type 2  Renal/GU negative Renal ROS     Musculoskeletal   Abdominal   Peds  Hematology negative hematology ROS (+)   Anesthesia Other Findings   Reproductive/Obstetrics                            Anesthesia Physical  Anesthesia Plan  ASA: III  Anesthesia Plan: General   Post-op Pain Management:  Regional for Post-op pain   Induction: Intravenous  PONV Risk Score and Plan: 1 and Ondansetron, Dexamethasone and Treatment may vary due to age or medical condition  Airway Management Planned: LMA  Additional Equipment:   Intra-op Plan:   Post-operative Plan: Extubation in OR  Informed Consent: I have reviewed the patients History and Physical, chart, labs and discussed the procedure including the risks, benefits and alternatives for the proposed anesthesia with the patient or authorized representative who has indicated his/her understanding and acceptance.     Dental advisory given  Plan Discussed with: Anesthesiologist  Anesthesia Plan Comments:        Anesthesia Quick Evaluation

## 2020-03-15 NOTE — Anesthesia Postprocedure Evaluation (Signed)
Anesthesia Post Note  Patient: Ricardo Howard  Procedure(s) Performed: RIGHT BELOW KNEE AMPUTATION (Right Knee)     Patient location during evaluation: PACU Anesthesia Type: General Level of consciousness: sedated Pain management: pain level controlled Vital Signs Assessment: post-procedure vital signs reviewed and stable Respiratory status: spontaneous breathing and respiratory function stable Cardiovascular status: stable Postop Assessment: no apparent nausea or vomiting Anesthetic complications: no   No complications documented.  Last Vitals:  Vitals:   03/15/20 1329 03/15/20 1347  BP: 133/88 (!) 136/95  Pulse: 80 79  Resp: 13 14  Temp: 36.9 C   SpO2: 95% 96%    Last Pain:  Vitals:   03/15/20 1347  TempSrc:   PainSc: 5                  Azura Tufaro DANIEL

## 2020-03-15 NOTE — Interval H&P Note (Signed)
History and Physical Interval Note:  03/15/2020 10:48 AM  [name]  has presented today for surgery, with the diagnosis of Gangrene Right Foot.  The various methods of treatment have been discussed with the patient and family. After consideration of risks, benefits and other options for treatment, the patient has consented to  Procedure(s): RIGHT BELOW KNEE AMPUTATION (Right) as a surgical intervention.  The patient's history has been reviewed, patient examined, no change in status, stable for surgery.  I have reviewed the patient's chart and labs.  Questions were answered to the patient's satisfaction.     Ricardo Howard

## 2020-03-15 NOTE — Op Note (Signed)
    NAME: Ricardo Howard    MRN: [id number] DOB: 1960/06/07    DATE OF OPERATION: 03/15/2020  PREOP DIAGNOSIS:    Gangrene of the right foot  POSTOP DIAGNOSIS:    Same  PROCEDURE:    Right below the knee amputation  SURGEON: Di Kindle. Edilia Bo, MD  ASSIST: Clinton Gallant, PA  ANESTHESIA: General  EBL: Minimal  INDICATIONS:    [name] is a 60 y.o. male who presented with a gangrenous right foot.  He underwent open right transmetatarsal amputation and the limb was clearly not salvageable.  He presents for below the knee amputation  FINDINGS:   Calf the muscle is well perfused with no signs of infection at this level  TECHNIQUE:   The patient was taken to the operating room and received a general anesthetic.  Of note he had a block placed but was somewhat fidgety and therefore had a general anesthetic.  The right leg was prepped and draped in usual sterile fashion after tourniquet was placed on the upper thigh.  The circumference of the limb was measured 10 cm distal to the tibial tuberosity and two thirds of this distance was used to mark the anterior skin flap.  A long posterior flap of equal length was marked.  The leg was exsanguinated with an Esmarch bandage and the tourniquet inflated to 300 mmHg.  Under tourniquet control the incision was carried down to the skin subcutaneous tissue fascia and muscle to the tibia and fibula which were dissected free circumferentially.  The tibia was divided proximal to the level of skin division and the anterior aspect the tibia was beveled.  The fibula was divided.  The leg was removed.  The tibial vessels were all individually suture ligated with 2-0 silk ties.  The tourniquet was then released.  Additional hemostasis was obtained using electrocautery.  The edges of the bone were rasped.  The wound was irrigated with copious amounts of saline.  The fascial layer was closed with interrupted 2-0 Vicryl's.  The skin was closed with  staples.  A sterile dressing was applied the patient tolerated the procedure well was transferred to the recovery room in stable condition.  All needle and sponge counts were correct.  Given the complexity of the case a first assistant was necessary in order to expedient the procedure and safely perform the technical aspects of the operation.  Waverly Ferrari, MD, FACS [facility]  DATE OF DICTATION:   03/15/2020

## 2020-03-15 NOTE — Transfer of Care (Signed)
Immediate Anesthesia Transfer of Care Note  Patient: Jah Alarid  Procedure(s) Performed: RIGHT BELOW KNEE AMPUTATION (Right Knee)  Patient Location: PACU  Anesthesia Type:GA combined with regional for post-op pain  Level of Consciousness: awake, alert  and patient cooperative  Airway & Oxygen Therapy: Patient Spontanous Breathing and Patient connected to nasal cannula oxygen  Post-op Assessment: Report given to RN, Post -op Vital signs reviewed and stable and Patient moving all extremities X 4  Post vital signs: Reviewed and stable  Last Vitals:  Vitals Value Taken Time  BP 130/87 03/15/20 1314  Temp    Pulse 83 03/15/20 1316  Resp 15 03/15/20 1316  SpO2 97 % 03/15/20 1316  Vitals shown include unvalidated device data.  Last Pain:  Vitals:   03/15/20 1109  TempSrc:   PainSc: 9       Patients Stated Pain Goal: 5 (03/15/20 1109)  Complications: No complications documented.

## 2020-03-15 NOTE — Evaluation (Signed)
Physical Therapy Evaluation Patient Details Name: Ricardo Howard MRN: [id number] DOB: 03-10-1960 Today's Date: 03/15/2020   History of Present Illness  Pt is 60 yo male with PMH: DM, EtOH abuse, HTN, who presents with gangrene of the R foot. 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  Clinical Impression  Pt admitted with above diagnosis. Pt very willing to attempt standing today on LLE. Mod A to come to sitting EOB as well as mod A for sit to stand. Pt noted to have uncontrolled diarrhea once up. Maintained standing for clean up. Educated pt on proper positioning of RLE in prep for prosthesis as well as mgmt of phantom limb pain and RLE there ex. Pt's wife present for session.  Pt currently with functional limitations due to the deficits listed below (see PT Problem List). Pt will benefit from skilled PT to increase their independence and safety with mobility to allow discharge to the venue listed below.       Follow Up Recommendations Home health PT;Supervision/Assistance - 24 hour    Equipment Recommendations  None recommended by PT    Recommendations for Other Services OT consult     Precautions / Restrictions Precautions Precautions: Fall;Knee Precaution Booklet Issued: No Precaution Comments: reviewed proper positioning of RLE Restrictions Weight Bearing Restrictions: Yes RLE Weight Bearing: Non weight bearing      Mobility  Bed Mobility Overal bed mobility: Needs Assistance Bed Mobility: Supine to Sit;Sit to Supine     Supine to sit: Mod assist Sit to supine: Min assist   General bed mobility comments: mod A at hips, heavy use of rail by pt and then mod HHA to elevate trunk into sitting.    Transfers Overall transfer level: Needs assistance Equipment used: Rolling walker (2 wheeled) Transfers: Sit to/from Stand Sit to Stand: Mod assist         General transfer comment: mod A for power up and wt shift fwd  Ambulation/Gait             General Gait Details: limited  by pt having uncontrolled diarrhea  Stairs            Wheelchair Mobility    Modified Rankin (Stroke Patients Only)       Balance Overall balance assessment: Needs assistance Sitting-balance support: No upper extremity supported Sitting balance-Leahy Scale: Fair     Standing balance support: Bilateral upper extremity supported Standing balance-Leahy Scale: Poor Standing balance comment: needs UE support and mod A to maintain standing. Maintained 3 minutes for bowel clean up                             Pertinent Vitals/Pain Pain Assessment: Faces Faces Pain Scale: Hurts little more Pain Location: RLE Pain Descriptors / Indicators: Aching Pain Intervention(s): Limited activity within patient's tolerance;Monitored during session;Premedicated before session    Home Living Family/patient expects to be discharged to:: Private residence Living Arrangements: Spouse/significant other Available Help at Discharge: Family;Available 24 hours/day Type of Home: House Home Access: Stairs to enter Entrance Stairs-Rails: Doctor, general practice of Steps: 4 Home Layout: One level Home Equipment: Walker - 2 wheels;Cane - single point;Grab bars - tub/shower;Crutches;Wheelchair - manual      Prior Function Level of Independence: Independent               Hand Dominance   Dominant Hand: Right    Extremity/Trunk Assessment   Upper Extremity Assessment Upper Extremity Assessment: Defer to OT evaluation  Lower Extremity Assessment Lower Extremity Assessment: RLE deficits/detail RLE Deficits / Details: hip flex 3/5, knee ext 3/5, knee flex 3/5 RLE Sensation: WNL RLE Coordination: WNL    Cervical / Trunk Assessment Cervical / Trunk Assessment: Normal  Communication   Communication: No difficulties  Cognition Arousal/Alertness: Lethargic;Suspect due to medications Behavior During Therapy: [facility] for tasks assessed/performed Overall Cognitive  Status: Within Functional Limits for tasks assessed                                        General Comments General comments (skin integrity, edema, etc.): Discussed mgmt of phantom limb pain. once seated pt again with diarrhea, rolled to clean again and change bedding underneath. RN aware.    Exercises Amputee Exercises Quad Sets: AROM;Right;10 reps;Supine Knee Flexion: AROM;Right;10 reps;Seated Knee Extension: AROM;Right;10 reps;Seated Straight Leg Raises: AROM;Right;10 reps;Supine   Assessment/Plan    PT Assessment Patient needs continued PT services  PT Problem List Decreased strength;Decreased range of motion;Decreased activity tolerance;Decreased balance;Decreased mobility;Decreased knowledge of use of DME;Decreased safety awareness;Decreased knowledge of precautions;Pain       PT Treatment Interventions DME instruction;Gait training;Stair training;Functional mobility training;Therapeutic activities;Therapeutic exercise;Balance training;Patient/family education    PT Goals (Current goals can be found in the Care Plan section)  Acute Rehab PT Goals Patient Stated Goal: return home PT Goal Formulation: With patient/family Time For Goal Achievement: 03/29/20 Potential to Achieve Goals: Good    Frequency Min 4X/week   Barriers to discharge        Co-evaluation               AM-PAC PT "6 Clicks" Mobility  Outcome Measure Help needed turning from your back to your side while in a flat bed without using bedrails?: A Little Help needed moving from lying on your back to sitting on the side of a flat bed without using bedrails?: A Lot Help needed moving to and from a bed to a chair (including a wheelchair)?: A Lot Help needed standing up from a chair using your arms (e.g., wheelchair or bedside chair)?: A Lot Help needed to walk in hospital room?: Total Help needed climbing 3-5 steps with a railing? : Total 6 Click Score: 11    End of Session  Equipment Utilized During Treatment: Gait belt Activity Tolerance: Patient tolerated treatment well Patient left: in bed;with call bell/phone within reach;with nursing/sitter in room;with family/visitor present Nurse Communication: Mobility status PT Visit Diagnosis: Difficulty in walking, not elsewhere classified (R26.2);Pain Pain - Right/Left: Right Pain - part of body: Leg    Time: 3154-0086 PT Time Calculation (min) (ACUTE ONLY): 35 min   Charges:   PT Evaluation $PT Eval Moderate Complexity: 1 Mod PT Treatments $Therapeutic Activity: 8-22 mins        Lyanne Co, PT  Acute Rehab Services  Pager [phone] Office [phone]   Lawana Chambers Huzaifa Viney 03/15/2020, 5:33 PM

## 2020-03-15 NOTE — Progress Notes (Signed)
PROGRESS NOTE    Ricardo Howard  UEA:[id number] DOB: 26-Feb-1960 DOA: 03/12/2020 PCP: [facility]    Brief Narrative:  60 year old male with history of hypertension, uncontrolled type 2 diabetes, severe peripheral artery disease, ongoing tobacco use, alcohol use and chronic pancreatitis with recent admission to [facility] for gangrene of the right foot status post stenting presents to the hospital with increasing pain of the right foot.  In the emergency room, he was noted to have wet gangrene of the second right toe.  Surgery was consulted.  Was started on IV antibiotics.  He was taken to operating room for TMA 1/23, unhealthy tissue and plan for right BKA on 1/25.   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  Blood cultures negative so far. Surgical culture with mixed growth and Enterococcus. Blood cultures negative.  Continue broad-spectrum antibiotics pharmacist until surgical amputation to the healthy margin. May de-escalate in next 24 hours.  # Uncontrolled type 2 diabetes with hyperglycemia: Noncompliant on insulin.  A1c more than 11.  Resumed home dose of insulin with long-acting and prandial insulin.  We will continue to uptitrate.  He is going to be n.p.o.  Will keep on current doses today.  # Hypertension: Blood pressure stable.  Resume carvedilol and Cozaar.  # Smoker: Counseled to quit.  Nicotine patch  # Alcohol use disorder: Patient drinks about 6 packs of beer a day.  Can go without drinking.  Will be closely monitored for withdrawal..  Stable so far. No evidence of  withdrawal.   DVT prophylaxis: enoxaparin (LOVENOX) injection 40 mg Start: 03/14/20 1000   Code Status: Full code Family Communication: Wife at the bedside Disposition Plan: Status is: Inpatient  Remains inpatient appropriate because:Inpatient level of care appropriate due to severity of illness   Dispo: The patient is from: Home              Anticipated d/c is to: CIR versus home with home health PT.              Anticipated d/c date is: 2 days.              Patient currently is not medically stable to d/c.   Difficult to place patient No         Consultants:   Vascular surgery  Procedures:   Right transmetatarsal amputation  Antimicrobials:  Antibiotics Given (last 72 hours)    Date/Time Action Medication Dose Rate   03/12/20 2156 New Bag/Given   [MAR Hold] piperacillin-tazobactam (ZOSYN) IVPB 3.375 g (MAR Hold since Tue 03/15/2020 at 1050.Hold Reason: Transfer to a Procedural area.) 3.375 g 12.5 mL/hr   03/13/20 0147 New Bag/Given   [MAR Hold] vancomycin (VANCOREADY) IVPB 1250 mg/250 mL (MAR Hold since Tue 03/15/2020 at 1050.Hold Reason: Transfer to a Procedural area.) 1,250 mg 166.7 mL/hr   03/13/20 0544 New Bag/Given   [MAR Hold] piperacillin-tazobactam (ZOSYN) IVPB 3.375 g (MAR Hold since Tue 03/15/2020 at 1050.Hold Reason: Transfer to a Procedural area.) 3.375 g 12.5 mL/hr   03/13/20 0742 Given   [MAR Hold] piperacillin-tazobactam (ZOSYN) IVPB 3.375 g (MAR Hold since Tue 03/15/2020 at 1050.Hold Reason:  Transfer to a Procedural area.) 3.375 g    03/13/20 1418 New Bag/Given   [MAR Hold] vancomycin (VANCOREADY) IVPB 1250 mg/250 mL (MAR Hold since Tue 03/15/2020 at 1050.Hold Reason: Transfer to a Procedural area.) 1,250 mg 166.7 mL/hr   03/13/20 1719 New Bag/Given   [MAR Hold] piperacillin-tazobactam (ZOSYN) IVPB 3.375 g (MAR Hold since Tue 03/15/2020 at 1050.Hold Reason: Transfer to a Procedural area.) 3.375 g 12.5 mL/hr   03/13/20 2159 New Bag/Given   [MAR Hold]  piperacillin-tazobactam (ZOSYN) IVPB 3.375 g (MAR Hold since Tue 03/15/2020 at 1050.Hold Reason: Transfer to a Procedural area.) 3.375 g 12.5 mL/hr   03/14/20 0050 New Bag/Given   [MAR Hold] vancomycin (VANCOREADY) IVPB 1250 mg/250 mL (MAR Hold since Tue 03/15/2020 at 1050.Hold Reason: Transfer to a Procedural area.) 1,250 mg 166.7 mL/hr   03/14/20 0633 New Bag/Given   [MAR Hold] piperacillin-tazobactam (ZOSYN) IVPB 3.375 g (MAR Hold since Tue 03/15/2020 at 1050.Hold Reason: Transfer to a Procedural area.) 3.375 g 12.5 mL/hr   03/14/20 1302 New Bag/Given   [MAR Hold] vancomycin (VANCOREADY) IVPB 1250 mg/250 mL (MAR Hold since Tue 03/15/2020 at 1050.Hold Reason: Transfer to a Procedural area.) 1,250 mg 166.7 mL/hr   03/14/20 1303 New Bag/Given   [MAR Hold] piperacillin-tazobactam (ZOSYN) IVPB 3.375 g (MAR Hold since Tue 03/15/2020 at 1050.Hold Reason: Transfer to a Procedural area.) 3.375 g 12.5 mL/hr   03/14/20 2357 New Bag/Given   [MAR Hold] piperacillin-tazobactam (ZOSYN) IVPB 3.375 g (MAR Hold since Tue 03/15/2020 at 1050.Hold Reason: Transfer to a Procedural area.) 3.375 g 12.5 mL/hr   03/15/20 0428 New Bag/Given   [MAR Hold] vancomycin (VANCOREADY) IVPB 1250 mg/250 mL (MAR Hold since Tue 03/15/2020 at 1050.Hold Reason: Transfer to a Procedural area.) 1,250 mg 166.7 mL/hr   03/15/20 0614 New Bag/Given   [MAR Hold] piperacillin-tazobactam (ZOSYN) IVPB 3.375 g (MAR Hold since Tue 03/15/2020 at 1050.Hold Reason: Transfer to a Procedural area.) 3.375 g 12.5 mL/hr   03/15/20 1157 Given   [MAR Hold] piperacillin-tazobactam (ZOSYN) IVPB 3.375 g (MAR Hold since Tue 03/15/2020 at 1050.Hold Reason: Transfer to a Procedural area.) 3.375 g          Subjective: Patient seen and examined. I examined him in the morning rounds before going to surgery. He had some nausea overnight but improved. Denies any problems. Denies any pain. No evidence of withdrawal, tremors. Wife at the bedside. They  have Objective: Vitals:   03/15/20 1115 03/15/20 1120 03/15/20 1125 03/15/20 1130  BP: (!) 170/99 (!) 162/96 (!) 156/117 (!) 157/92  Pulse: 93 94 93 91  Resp: (!) 23 19 (!) 28   Temp:      TempSrc:      SpO2: 94% 97% 95% 98%  Weight:      Height:        Intake/Output Summary (Last 24 hours) at 03/15/2020 1305 Last data filed at 03/15/2020 1235 Gross per 24 hour  Intake -  Output 310 ml  Net -310 ml   Filed Weights   03/12/20 0906 03/12/20 2048 03/15/20 1109  Weight: 84.8 kg 87.1 kg 87.1 kg    Examination:  General exam: Appears calm and comfortable  Respiratory system: Clear to auscultation. Respiratory effort normal. Cardiovascular system: S1 & S2 heard, RRR. No JVD, murmurs, rubs, gallops or clicks.  Gastrointestinal system: Abdomen is nondistended, soft and nontender. No organomegaly or masses felt. Normal bowel sounds heard. Central nervous system: Alert and oriented. No focal neurological deficits. Extremities: Symmetric 5 x 5 power. Skin: No  rashes, lesions or ulcers Psychiatry: Judgement and insight appear normal. Mood & affect appropriate.  Right foot with postop dressing.  Not removed by me. Pictures in the chart.   Data Reviewed: I have personally reviewed following labs and imaging studies  CBC: Recent Labs  Lab 03/12/20 0956 03/13/20 0310 03/14/20 0846 03/15/20 0248  WBC 20.1* 19.6* 18.2* 16.8*  NEUTROABS 17.4*  --  14.7* 12.6*  HGB 12.6* 11.9* 11.6* 12.4*  HCT 38.8* 34.9* 35.2* 38.1*  MCV 93.9 90.6 92.9 92.7  PLT 287 325 342 351   Basic Metabolic Panel: Recent Labs  Lab 03/12/20 0956 03/13/20 0310 03/14/20 0846 03/15/20 0248  NA 135 136 135 136  K 4.7 4.1 3.3* 3.9  CL 100 104 104 106  CO2 22 21* 20* 18*  GLUCOSE 433* 267* 211* 187*  BUN 11 10 6 6   CREATININE 0.74 0.67 0.68 0.56*  CALCIUM 9.2 8.2* 7.6* 7.6*  MG  --   --  1.6*  --    GFR: Estimated Creatinine Clearance: 105.4 mL/min (A) (by C-G formula based on SCr of 0.56 mg/dL  (L)). Liver Function Tests: No results for input(s): AST, ALT, ALKPHOS, BILITOT, PROT, ALBUMIN in the last 168 hours. No results for input(s): LIPASE, AMYLASE in the last 168 hours. No results for input(s): AMMONIA in the last 168 hours. Coagulation Profile: No results for input(s): INR, PROTIME in the last 168 hours. Cardiac Enzymes: No results for input(s): CKTOTAL, CKMB, CKMBINDEX, TROPONINI in the last 168 hours. BNP (last 3 results) No results for input(s): PROBNP in the last 8760 hours. HbA1C: Recent Labs    03/12/20 2055  HGBA1C 11.4*   CBG: Recent Labs  Lab 03/14/20 1056 03/14/20 1602 03/14/20 2113 03/15/20 0604 03/15/20 1009  GLUCAP 240* 288* 188* 178* 162*   Lipid Profile: No results for input(s): CHOL, HDL, LDLCALC, TRIG, CHOLHDL, LDLDIRECT in the last 72 hours. Thyroid Function Tests: No results for input(s): TSH, T4TOTAL, FREET4, T3FREE, THYROIDAB in the last 72 hours. Anemia Panel: No results for input(s): VITAMINB12, FOLATE, FERRITIN, TIBC, IRON, RETICCTPCT in the last 72 hours. Sepsis Labs: Recent Labs  Lab 03/12/20 0956 03/12/20 1342  LATICACIDVEN 2.8* 1.5    Recent Results (from the past 240 hour(s))  SARS CORONAVIRUS 2 (TAT 6-24 HRS) Nasopharyngeal Nasopharyngeal Swab     Status: None   Collection Time: 03/12/20  9:35 AM   Specimen: Nasopharyngeal Swab  Result Value Ref Range Status   SARS Coronavirus 2 NEGATIVE NEGATIVE Final    Comment: (NOTE) SARS-CoV-2 target nucleic acids are NOT DETECTED.  The SARS-CoV-2 RNA is generally detectable in upper and lower respiratory specimens during the acute phase of infection. Negative results do not preclude SARS-CoV-2 infection, do not rule out co-infections with other pathogens, and should not be used as the sole basis for treatment or other patient management decisions. Negative results must be combined with clinical observations, patient history, and epidemiological information. The expected result  is Negative.  Fact Sheet for Patients: HairSlick.nohttps://www.fda.gov/media/138098/download  Fact Sheet for Healthcare Providers: quierodirigir.comhttps://www.fda.gov/media/138095/download  This test is not yet approved or cleared by the [place] FDA and  has been authorized for detection and/or diagnosis of SARS-CoV-2 by FDA under an Emergency Use Authorization (EUA). This EUA will remain  in effect (meaning this test can be used) for the duration of the COVID-19 declaration under Se ction 564(b)(1) of the Act, 21 U.S.C. section 360bbb-3(b)(1), unless the authorization is terminated or revoked sooner.  Performed at [facility],  [address] [place], [place] [address]   Blood culture (routine x 2)     Status: None (Preliminary result)   Collection Time: 03/12/20  9:37 AM   Specimen: BLOOD  Result Value Ref Range Status   Specimen Description BLOOD LEFT ANTECUBITAL  Final   Special Requests   Final    BOTTLES DRAWN AEROBIC AND ANAEROBIC Blood Culture adequate volume   Culture   Final    NO GROWTH 2 DAYS Performed at [facility], [address] [place], [place] [address]    Report Status PENDING  Incomplete  Blood culture (routine x 2)     Status: None (Preliminary result)   Collection Time: 03/12/20  8:55 PM   Specimen: BLOOD  Result Value Ref Range Status   Specimen Description BLOOD LEFT ANTECUBITAL  Final   Special Requests   Final    BOTTLES DRAWN AEROBIC ONLY Blood Culture adequate volume   Culture   Final    NO GROWTH 2 DAYS Performed at [facility], [address] [place], [place] [address]    Report Status PENDING  Incomplete  Surgical pcr screen     Status: None   Collection Time: 03/12/20  9:04 PM   Specimen: Nasal Mucosa; Nasal Swab  Result Value Ref Range Status   MRSA, PCR NEGATIVE NEGATIVE Final   Staphylococcus aureus NEGATIVE NEGATIVE Final    Comment: (NOTE) The Xpert SA Assay (FDA approved for NASAL specimens in patients 61 years of age and  older), is one component of a comprehensive surveillance program. It is not intended to diagnose infection nor to guide or monitor treatment. Performed at [facility], [address] [place], [place] [address]   Aerobic/Anaerobic Culture (surgical/deep wound)     Status: None (Preliminary result)   Collection Time: 03/13/20  8:07 AM   Specimen: Wound  Result Value Ref Range Status   Specimen Description WOUND  Final   Special Requests NONE  Final   Gram Stain   Final    NO WBC SEEN ABUNDANT GRAM NEGATIVE RODS MODERATE GRAM POSITIVE COCCI Performed at [facility], [address] [place], [place] [address]    Culture RARE ENTEROCOCCUS AVIUM  Final   Report Status PENDING  Incomplete   Organism ID, Bacteria ENTEROCOCCUS AVIUM  Final      Susceptibility   Enterococcus avium - MIC*    AMPICILLIN <=2 SENSITIVE Sensitive     VANCOMYCIN <=0.5 SENSITIVE Sensitive     GENTAMICIN SYNERGY SENSITIVE Sensitive     * RARE ENTEROCOCCUS AVIUM         Radiology Studies: No results found.      Scheduled Meds: . [MAR Hold] atorvastatin  80 mg Oral Daily  . [MAR Hold] buPROPion  150 mg Oral BID  . [MAR Hold] carvedilol  20 mg Oral Daily  . [MAR Hold] enoxaparin (LOVENOX) injection  40 mg Subcutaneous Q24H  . [MAR Hold] gabapentin  300 mg Oral BID  . [MAR Hold] insulin aspart  0-15 Units Subcutaneous TID WC  . [MAR Hold] insulin aspart  4 Units Subcutaneous TID WC  . [MAR Hold] insulin glargine  8 Units Subcutaneous Daily  . [MAR Hold] lipase/protease/amylase  36,000 Units Oral TID AC  . [MAR Hold] losartan  50 mg Oral Daily  . [MAR Hold] magnesium oxide  400 mg Oral BID  . [MAR Hold] pantoprazole  40 mg Oral Daily  . [MAR Hold] potassium chloride  40 mEq Oral BID   Continuous Infusions: . sodium chloride 125 mL/hr at 03/15/20 0820  . [MAR Hold] piperacillin-tazobactam (ZOSYN)  IV 3.375 g (03/15/20 4196)  . [MAR Hold] vancomycin 1,250 mg (03/15/20 0428)     LOS:  3 days    Time spent: 25 minutes    Dorcas Carrow, MD [facility] Pager [phone]

## 2020-03-15 NOTE — Anesthesia Procedure Notes (Signed)
Anesthesia Regional Block: Popliteal block   Pre-Anesthetic Checklist: ,, timeout performed, Correct Patient, Correct Site, Correct Laterality, Correct Procedure, Correct Position, site marked, Risks and benefits discussed,  Surgical consent,  Pre-op evaluation,  At surgeon's request and post-op pain management  Laterality: Right  Prep: chloraprep       Needles:  Injection technique: Single-shot  Needle Type: Echogenic Stimulator Needle          Additional Needles:   Narrative:  Start time: 03/15/2020 11:16 AM End time: 03/15/2020 11:26 AM Injection made incrementally with aspirations every 5 mL.  Performed by: Personally  Anesthesiologist: Heather Roberts, MD  Additional Notes: A functioning IV was confirmed and monitors were applied.  Sterile prep and drape, hand hygiene and sterile gloves were used.  Negative aspiration and test dose prior to incremental administration of local anesthetic. The patient tolerated the procedure well.Ultrasound  guidance: relevant anatomy identified, needle position confirmed, local anesthetic spread visualized around nerve(s), vascular puncture avoided.  Image printed for medical record. ACB supplementation.

## 2020-03-15 NOTE — Anesthesia Procedure Notes (Signed)
Procedure Name: LMA Insertion Date/Time: 03/15/2020 12:01 PM Performed by: Melina Schools, CRNA Pre-anesthesia Checklist: Patient identified, Emergency Drugs available, Suction available and Patient being monitored Patient Re-evaluated:Patient Re-evaluated prior to induction Oxygen Delivery Method: Circle System Utilized Preoxygenation: Pre-oxygenation with 100% oxygen Induction Type: IV induction Ventilation: Mask ventilation without difficulty LMA: LMA inserted LMA Size: 5.0 Number of attempts: 1 Airway Equipment and Method: Bite block Placement Confirmation: positive ETCO2 Tube secured with: Tape Dental Injury: Teeth and Oropharynx as per pre-operative assessment

## 2020-03-16 ENCOUNTER — Encounter (HOSPITAL_COMMUNITY): Payer: Self-pay | Admitting: Vascular Surgery

## 2020-03-16 DIAGNOSIS — I96 Gangrene, not elsewhere classified: Secondary | ICD-10-CM | POA: Diagnosis not present

## 2020-03-16 LAB — CBC WITH DIFFERENTIAL/PLATELET
Abs Immature Granulocytes: 0.26 10*3/uL — ABNORMAL HIGH (ref 0.00–0.07)
Basophils Absolute: 0.1 10*3/uL (ref 0.0–0.1)
Basophils Relative: 1 %
Eosinophils Absolute: 0.2 10*3/uL (ref 0.0–0.5)
Eosinophils Relative: 2 %
HCT: 36.9 % — ABNORMAL LOW (ref 39.0–52.0)
Hemoglobin: 12.4 g/dL — ABNORMAL LOW (ref 13.0–17.0)
Immature Granulocytes: 2 %
Lymphocytes Relative: 12 %
Lymphs Abs: 1.4 10*3/uL (ref 0.7–4.0)
MCH: 30.9 pg (ref 26.0–34.0)
MCHC: 33.6 g/dL (ref 30.0–36.0)
MCV: 92 fL (ref 80.0–100.0)
Monocytes Absolute: 0.8 10*3/uL (ref 0.1–1.0)
Monocytes Relative: 7 %
Neutro Abs: 8.9 10*3/uL — ABNORMAL HIGH (ref 1.7–7.7)
Neutrophils Relative %: 76 %
Platelets: 366 10*3/uL (ref 150–400)
RBC: 4.01 MIL/uL — ABNORMAL LOW (ref 4.22–5.81)
RDW: 14.5 % (ref 11.5–15.5)
WBC: 11.7 10*3/uL — ABNORMAL HIGH (ref 4.0–10.5)
nRBC: 0 % (ref 0.0–0.2)

## 2020-03-16 LAB — GLUCOSE, CAPILLARY
Glucose-Capillary: 225 mg/dL — ABNORMAL HIGH (ref 70–99)
Glucose-Capillary: 194 mg/dL — ABNORMAL HIGH (ref 70–99)
Glucose-Capillary: 154 mg/dL — ABNORMAL HIGH (ref 70–99)
Glucose-Capillary: 124 mg/dL — ABNORMAL HIGH (ref 70–99)

## 2020-03-16 LAB — BASIC METABOLIC PANEL
Anion gap: 9 (ref 5–15)
BUN: 7 mg/dL (ref 6–20)
CO2: 21 mmol/L — ABNORMAL LOW (ref 22–32)
Calcium: 7.5 mg/dL — ABNORMAL LOW (ref 8.9–10.3)
Chloride: 109 mmol/L (ref 98–111)
Creatinine, Ser: 0.69 mg/dL (ref 0.61–1.24)
GFR, Estimated: >60 mL/min (ref >60)
Glucose, Bld: 279 mg/dL — ABNORMAL HIGH (ref 70–99)
Potassium: 4.2 mmol/L (ref 3.5–5.1)
Sodium: 139 mmol/L (ref 135–145)

## 2020-03-16 MED ORDER — MORPHINE SULFATE (PF) 2 MG/ML IV SOLN
2.0000 mg | INTRAVENOUS | Status: DC | PRN
  Administered 2020-03-16 (×2): 2 mg via INTRAVENOUS
  Filled 2020-03-16 (×3): qty 1

## 2020-03-16 MED ORDER — HYDROMORPHONE HCL 1 MG/ML IJ SOLN
0.5000 mg | INTRAMUSCULAR | Status: DC | PRN
  Administered 2020-03-16 – 2020-03-18 (×4): 0.5 mg via INTRAVENOUS
  Filled 2020-03-16 (×5): qty 1

## 2020-03-16 MED ORDER — INSULIN GLARGINE 100 UNIT/ML ~~LOC~~ SOLN
8.0000 [IU] | Freq: Once | SUBCUTANEOUS | Status: AC
  Administered 2020-03-16: 8 [IU] via SUBCUTANEOUS
  Filled 2020-03-16: qty 0.08

## 2020-03-16 MED ORDER — INSULIN GLARGINE 100 UNIT/ML ~~LOC~~ SOLN
16.0000 [IU] | Freq: Every day | SUBCUTANEOUS | Status: DC
  Administered 2020-03-17 – 2020-03-19 (×3): 16 [IU] via SUBCUTANEOUS
  Filled 2020-03-16 (×3): qty 0.16

## 2020-03-16 NOTE — Progress Notes (Signed)
Inpatient Diabetes Program Recommendations  AACE/ADA: New Consensus Statement on Inpatient Glycemic Control (2015)  Target Ranges:  Prepandial:   less than 140 mg/dL      Peak postprandial:   less than 180 mg/dL (1-2 hours)      Critically ill patients:  140 - 180 mg/dL   Lab Results  Component Value Date   GLUCAP 194 (H) 03/16/2020   HGBA1C 11.4 (H) 03/12/2020    Review of Glycemic Control Results for ALPHUS, ZECK (MRN [id number]) as of 03/16/2020 13:08  Ref. Range 03/15/2020 21:38 03/16/2020 05:54 03/16/2020 11:18  Glucose-Capillary Latest Ref Range: 70 - 99 mg/dL 818 (H) 299 (H) 371 (H)   Diabetes history: DM 2 Outpatient Diabetes medications:  Novolog 4 units tid with meals Lantus 16 units daily Current orders for Inpatient glycemic control:  Novolog moderate tid with meals Lantus 8 units daily Novolog 4 units tid with meals  Inpatient Diabetes Program Recommendations:   Please increase Lantus to home dose of 16 units daily.   Thanks  Beryl Meager, RN, BC-ADM Inpatient Diabetes Coordinator Pager [phone] (8a-5p)

## 2020-03-16 NOTE — Progress Notes (Signed)
PROGRESS NOTE    Ricardo Howard  TCY:[id number] DOB: June 12, 1960 DOA: 03/12/2020 PCP: [facility]   Chief Complain: Right foot pain  Brief Narrative: Patient is a 60 year old male with history of hypertension, uncontrolled diabetes type 2, severe peripheral artery disease, ongoing tobacco use, alcohol use, current pancreatitis who presented to the emergency department with complaints of increasing right foot.  He was noted to have wet gangrene of the second right toe.  Vascular surgery was consulted, started on antibiotics.  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  Continue antibiotics for now as per vascular surgery.  PT/OT recommended home health.  Assessment & Plan:   Principal Problem:   Gangrene of toe of right foot (HCC) Active Problems:   Alcohol abuse   Essential hypertension   Uncontrolled type 2 diabetes mellitus with diabetic polyneuropathy, with long-term current use of insulin (HCC)   Gangrene of right foot/soft tissue infection/suspected osteomyelitis/severe peripheral vascular disease: He was a started on broad-spectrum antibiotics on admission.  Blood cultures have been negative so far.  Wound culture showing gram-negative bacteria and Enterococcus avium.  Continue Zosyn for now.  We will follow final culture report.  Vascular surgery recommended to continue IV antibiotics for 48 hours post surgery. Leukocytosis is improving. Patient seen by PT and recommended home health on discharge.  Continue supportive care, pain management. Continue statin  Uncontrolled type diabetes mellitus with hyperglycemia: Noncompliant on insulin.  Hemoglobin A1c more than 11.  Continue current insulin regimen.  Continue to reinforce strict glycemic control, compliance.  Hypertension: Mildly hypertensive today.  Monitor blood pressure.  Continue carvedilol, losartan  Ongoing tobacco use: Counseled to quit.  Continue nicotine patch  Chronic alcohol abuse: Drinks about 6 packs of beer a day.   Monitor for withdrawal.    History of chronic pancreatitis:Denies any abdominal pain.  On Creon.            DVT prophylaxis:Lovenox Code Status: Full Family Communication: None at bedside Status is: Inpatient  Remains inpatient appropriate because:Inpatient level of care appropriate due to severity of illness   Dispo:  Patient From: Home  Planned Disposition: Home with Home health  Expected discharge date: 03/18/20  Medically stable for discharge: No       Consultants:Vascular surgery   Procedures:BKA  Antimicrobials:  Anti-infectives (From admission, onward)   Start     Dose/Rate Route Frequency Ordered Stop   03/13/20 0100  vancomycin (VANCOREADY) IVPB 1250 mg/250 mL        1,250 mg 166.7 mL/hr over 90 Minutes Intravenous Every 12 hours 03/12/20 1121 03/18/20 0800   03/12/20 1600  piperacillin-tazobactam (ZOSYN) IVPB 3.375 g        3.375 g 12.5 mL/hr over 240 Minutes Intravenous Every 8 hours 03/12/20 1545 03/18/20 0800   03/12/20 1045  cefTRIAXone (ROCEPHIN) 2 g in sodium chloride 0.9 % 100 mL IVPB        2 g 200 mL/hr over 30 Minutes Intravenous  Once 03/12/20 1032 03/12/20 1125   03/12/20 0945  vancomycin (VANCOREADY) IVPB 1750 mg/350 mL        1,750 mg 175 mL/hr over 120 Minutes Intravenous  Once 03/12/20 1216 03/12/20 1405      Subjective: Patient seen and examined at the bedside this morning.  Hemodynamically stable during my evaluation.  He was complaining of severe pain on the operated limb.  Denies other complaints  Objective: Vitals:   03/15/20 1347 03/15/20 1701 03/16/20 0056 03/16/20 0626  BP: (!) 136/95 106/71 (!) 147/90 Marland Kitchen)  164/97  Pulse: 79 80 86 98  Resp: 14 14 16 16   Temp:  98.4 F (36.9 C) 98.4 F (36.9 C) 98 F (36.7 C)  TempSrc:  Oral Oral Oral  SpO2: 96% 100% 97% 94%  Weight:      Height:        Intake/Output Summary (Last 24 hours) at 03/16/2020 0927 Last data filed at 03/16/2020 03/18/2020 Gross per 24 hour  Intake 1400 ml   Output 410 ml  Net 990 ml   Filed Weights   03/12/20 0906 03/12/20 2048 03/15/20 1109  Weight: 84.8 kg 87.1 kg 87.1 kg    Examination:  General exam: In moderate distress due to pain HEENT:PERRL,Oral mucosa moist, Ear/Nose normal on gross exam Respiratory system: Bilateral equal air entry, normal vesicular breath sounds, no wheezes or crackles  Cardiovascular system: S1 & S2 heard, RRR. No JVD, murmurs, rubs, gallops or clicks. No pedal edema. Gastrointestinal system: Abdomen is nondistended, soft and nontender. No organomegaly or masses felt. Normal bowel sounds heard. Central nervous system: Alert and oriented. No focal neurological deficits. Extremities: No edema, no clubbing ,no cyanosis, right BKA, right leg covered with dressing  skin: No rashes, lesions or ulcers,no icterus ,no pallor   Data Reviewed: I have personally reviewed following labs and imaging studies  CBC: Recent Labs  Lab 03/12/20 0956 03/13/20 0310 03/14/20 0846 03/15/20 0248 03/16/20 0220  WBC 20.1* 19.6* 18.2* 16.8* 11.7*  NEUTROABS 17.4*  --  14.7* 12.6* 8.9*  HGB 12.6* 11.9* 11.6* 12.4* 12.4*  HCT 38.8* 34.9* 35.2* 38.1* 36.9*  MCV 93.9 90.6 92.9 92.7 92.0  PLT 287 325 342 351 366   Basic Metabolic Panel: Recent Labs  Lab 03/12/20 0956 03/13/20 0310 03/14/20 0846 03/15/20 0248 03/16/20 0220  NA 135 136 135 136 139  K 4.7 4.1 3.3* 3.9 4.2  CL 100 104 104 106 109  CO2 22 21* 20* 18* 21*  GLUCOSE 433* 267* 211* 187* 279*  BUN 11 10 6 6 7   CREATININE 0.74 0.67 0.68 0.56* 0.69  CALCIUM 9.2 8.2* 7.6* 7.6* 7.5*  MG  --   --  1.6*  --   --    GFR: Estimated Creatinine Clearance: 105.4 mL/min (by C-G formula based on SCr of 0.69 mg/dL). Liver Function Tests: No results for input(s): AST, ALT, ALKPHOS, BILITOT, PROT, ALBUMIN in the last 168 hours. No results for input(s): LIPASE, AMYLASE in the last 168 hours. No results for input(s): AMMONIA in the last 168 hours. Coagulation Profile: No  results for input(s): INR, PROTIME in the last 168 hours. Cardiac Enzymes: No results for input(s): CKTOTAL, CKMB, CKMBINDEX, TROPONINI in the last 168 hours. BNP (last 3 results) No results for input(s): PROBNP in the last 8760 hours. HbA1C: No results for input(s): HGBA1C in the last 72 hours. CBG: Recent Labs  Lab 03/15/20 1009 03/15/20 1314 03/15/20 1636 03/15/20 2138 03/16/20 0554  GLUCAP 162* 162* 268* 270* 225*   Lipid Profile: No results for input(s): CHOL, HDL, LDLCALC, TRIG, CHOLHDL, LDLDIRECT in the last 72 hours. Thyroid Function Tests: No results for input(s): TSH, T4TOTAL, FREET4, T3FREE, THYROIDAB in the last 72 hours. Anemia Panel: No results for input(s): VITAMINB12, FOLATE, FERRITIN, TIBC, IRON, RETICCTPCT in the last 72 hours. Sepsis Labs: Recent Labs  Lab 03/12/20 0956 03/12/20 1342  LATICACIDVEN 2.8* 1.5    Recent Results (from the past 240 hour(s))  SARS CORONAVIRUS 2 (TAT 6-24 HRS) Nasopharyngeal Nasopharyngeal Swab     Status: None  Collection Time: 03/12/20  9:35 AM   Specimen: Nasopharyngeal Swab  Result Value Ref Range Status   SARS Coronavirus 2 NEGATIVE NEGATIVE Final    Comment: (NOTE) SARS-CoV-2 target nucleic acids are NOT DETECTED.  The SARS-CoV-2 RNA is generally detectable in upper and lower respiratory specimens during the acute phase of infection. Negative results do not preclude SARS-CoV-2 infection, do not rule out co-infections with other pathogens, and should not be used as the sole basis for treatment or other patient management decisions. Negative results must be combined with clinical observations, patient history, and epidemiological information. The expected result is Negative.  Fact Sheet for Patients: HairSlick.no  Fact Sheet for Healthcare Providers: quierodirigir.com  This test is not yet approved or cleared by the [place] FDA and  has been authorized  for detection and/or diagnosis of SARS-CoV-2 by FDA under an Emergency Use Authorization (EUA). This EUA will remain  in effect (meaning this test can be used) for the duration of the COVID-19 declaration under Se ction 564(b)(1) of the Act, 21 U.S.C. section 360bbb-3(b)(1), unless the authorization is terminated or revoked sooner.  Performed at [facility], [address] [place], [place] [address]   Blood culture (routine x 2)     Status: None (Preliminary result)   Collection Time: 03/12/20  9:37 AM   Specimen: BLOOD  Result Value Ref Range Status   Specimen Description BLOOD LEFT ANTECUBITAL  Final   Special Requests   Final    BOTTLES DRAWN AEROBIC AND ANAEROBIC Blood Culture adequate volume   Culture   Final    NO GROWTH 3 DAYS Performed at [facility], [address] [place], [place] [address]    Report Status PENDING  Incomplete  Blood culture (routine x 2)     Status: None (Preliminary result)   Collection Time: 03/12/20  8:55 PM   Specimen: BLOOD  Result Value Ref Range Status   Specimen Description BLOOD LEFT ANTECUBITAL  Final   Special Requests   Final    BOTTLES DRAWN AEROBIC ONLY Blood Culture adequate volume   Culture   Final    NO GROWTH 3 DAYS Performed at [facility], [address] [place], [place] [address]    Report Status PENDING  Incomplete  Surgical pcr screen     Status: None   Collection Time: 03/12/20  9:04 PM   Specimen: Nasal Mucosa; Nasal Swab  Result Value Ref Range Status   MRSA, PCR NEGATIVE NEGATIVE Final   Staphylococcus aureus NEGATIVE NEGATIVE Final    Comment: (NOTE) The Xpert SA Assay (FDA approved for NASAL specimens in patients 49 years of age and older), is one component of a comprehensive surveillance program. It is not intended to diagnose infection nor to guide or monitor treatment. Performed at [facility], [address] [place], [place] [address]   Aerobic/Anaerobic Culture (surgical/deep  wound)     Status: None (Preliminary result)   Collection Time: 03/13/20  8:07 AM   Specimen: Wound  Result Value Ref Range Status   Specimen Description WOUND  Final   Special Requests NONE  Final   Gram Stain   Final    NO WBC SEEN ABUNDANT GRAM NEGATIVE RODS MODERATE GRAM POSITIVE COCCI    Culture   Final    RARE ENTEROCOCCUS AVIUM HOLDING FOR POSSIBLE ANAEROBE Performed at [facility], [address] [place], [place] [address]    Report Status PENDING  Incomplete  Organism ID, Bacteria ENTEROCOCCUS AVIUM  Final      Susceptibility   Enterococcus avium - MIC*    AMPICILLIN <=2 SENSITIVE Sensitive     VANCOMYCIN <=0.5 SENSITIVE Sensitive     GENTAMICIN SYNERGY SENSITIVE Sensitive     * RARE ENTEROCOCCUS AVIUM         Radiology Studies: No results found.      Scheduled Meds: . atorvastatin  80 mg Oral Daily  . buPROPion  150 mg Oral BID  . carvedilol  20 mg Oral Daily  . enoxaparin (LOVENOX) injection  40 mg Subcutaneous Q24H  . gabapentin  300 mg Oral BID  . insulin aspart  0-15 Units Subcutaneous TID WC  . insulin aspart  4 Units Subcutaneous TID WC  . insulin glargine  8 Units Subcutaneous Daily  . lipase/protease/amylase  36,000 Units Oral TID AC  . losartan  50 mg Oral Daily  . magnesium oxide  400 mg Oral BID  . pantoprazole  40 mg Oral Daily   Continuous Infusions: . sodium chloride 125 mL/hr at 03/15/20 1402  . piperacillin-tazobactam (ZOSYN)  IV 3.375 g (03/16/20 0349)  . vancomycin 1,250 mg (03/16/20 0216)     LOS: 4 days    Time spent: 25 mins.More than 50% of that time was spent in counseling and/or coordination of care.      Burnadette Pop, MD [facility] P1/26/2022, 9:27 AM

## 2020-03-16 NOTE — Progress Notes (Addendum)
Pharmacy Antibiotic Note  Fox Salminen is a 60 y.o. male admitted on 03/12/2020 with foot infection, with concern for osteomyelitis.  Pharmacy has been consulted for vancomycin dosing. Patient now s/p BKA, but continuing antibiotics for another 48 hours per [facility] note due to extent of infection.   Cultures have grown ampicillin sensitive enterococcus avium and GNRs. D/w primary MD. Will narrow therapy to zosyn based on cultures and d/c vancomycin as no resistant GPC organisms present on culture.    Plan: Discontinue vancomycin Continue Zosyn 3.375g IV every 8 hours (extended infusion) Monitor renal function, foot imaging, Cx and clinical progression   Height: 5\' 8"  (172.7 cm) Weight: 87.1 kg (192 lb 0.3 oz) IBW/kg (Calculated) : 68.4  Temp (24hrs), Avg:98.2 F (36.8 C), Min:97.9 F (36.6 C), Max:98.4 F (36.9 C)  Recent Labs  Lab 03/12/20 0956 03/12/20 1342 03/13/20 0310 03/14/20 0846 03/15/20 0248 03/16/20 0220  WBC 20.1*  --  19.6* 18.2* 16.8* 11.7*  CREATININE 0.74  --  0.67 0.68 0.56* 0.69  LATICACIDVEN 2.8* 1.5  --   --   --   --     Estimated Creatinine Clearance: 105.4 mL/min (by C-G formula based on SCr of 0.69 mg/dL).    Allergies  Allergen Reactions  . Iohexol Hives    Patient broke out in hives after injection of Omni 300, will need 13 hour pre-med in future  . Lisinopril Cough  . Simvastatin Itching and Nausea Only    Infant Doane A. 03/18/20, PharmD, BCPS, FNKF Clinical Pharmacist  Please utilize Amion for appropriate phone number to reach the unit pharmacist [facility])   03/16/2020 9:07 AM

## 2020-03-16 NOTE — Progress Notes (Signed)
Occupational Therapy Evaluation Patient Details Name: Ricardo Howard MRN: [id number] DOB: [birth year] Today's Date: 03/16/2020    History of Present Illness Pt is 60 yo male with PMH: DM, EtOH abuse, HTN, who presents with gangrene of the R foot. 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   Clinical Impression   Pt with excellent participation with OT evaluation this date, motivated but limited by pain. Nursing aware and provided pain meds prior to session. Pt presents with ?insight and safety awareness, but requiring generally min-mod A+2 for safety with transfers and brief trial of mobility. Ranging on requiring min-max A for ADL's, with noted deficits in activity tolerance, balance and strength leading to A required for transfers and ADL's at this time. Pt is an excellent candidate for high intensity post acute OT at time of d/c to maximize Indep with ADL's and mobility prior to return to home with wife.    Follow Up Recommendations  CIR    Equipment Recommendations   (Will continue to assess needs)    Recommendations for Other Services Rehab consult     Precautions / Restrictions Precautions Precautions: Fall;Knee Precaution Booklet Issued: No Precaution Comments: pain management Restrictions Weight Bearing Restrictions: Yes RLE Weight Bearing: Non weight bearing      Mobility Bed Mobility Overal bed mobility: Needs Assistance Bed Mobility: Supine to Sit     Supine to sit: Min assist;+2 for physical assistance;HOB elevated Sit to supine: Min assist   General bed mobility comments: HOB elevated, requiring A for hand to pull to sitting EOB and A for transition of hips/LE's off of bed.    Transfers Overall transfer level: Needs assistance Equipment used: Rolling walker (2 wheeled) Transfers: Sit to/from Stand Sit to Stand: +2 physical assistance;Mod assist         General transfer comment: Assist for power-up to full standing position. VC's for hand placement on seated surface  for safety. Pt required VC's and assist from PT to extend R knee prior to initiating stand>sit.    Balance Overall balance assessment: Needs assistance Sitting-balance support: No upper extremity supported Sitting balance-Leahy Scale: Fair     Standing balance support: Bilateral upper extremity supported Standing balance-Leahy Scale: Poor Standing balance comment: Reliant on UE support.                           ADL either performed or assessed with clinical judgement   ADL Overall ADL's : Needs assistance/impaired Eating/Feeding: Set up               Upper Body Dressing : Minimal assistance;Sitting   Lower Body Dressing: Sit to/from stand;Maximal assistance   Toilet Transfer: Moderate assistance;+2 for physical assistance;Cueing for safety;RW   Toileting- Clothing Manipulation and Hygiene: Maximal assistance;+2 for physical assistance;Sit to/from stand;Cueing for safety         General ADL Comments: Fluctuating at this time from requiring min-max A for ADL's limited by balance, discomfort/pain, increased level of A for standing ADL's d/t heavy reliance on UE's for support.     Vision Baseline Vision/History: No visual deficits       Perception     Praxis      Pertinent Vitals/Pain Pain Assessment: Faces Pain Score: 5  Faces Pain Scale: Hurts little more Pain Location: RLE Pain Descriptors / Indicators: Aching;Constant Pain Intervention(s): Limited activity within patient's tolerance;Premedicated before session;Monitored during session     Hand Dominance Right   Extremity/Trunk Assessment Upper Extremity Assessment Upper Extremity  Assessment: Overall WFL for tasks assessed       Cervical / Trunk Assessment Cervical / Trunk Assessment: Normal   Communication Communication Communication: No difficulties   Cognition Arousal/Alertness: Awake/alert Behavior During Therapy: WFL for tasks assessed/performed;Flat affect Overall Cognitive  Status: Impaired/Different from baseline Area of Impairment: Problem solving;Memory                     Memory: Decreased recall of precautions       Problem Solving: Slow processing;Requires verbal cues General Comments: noted slowed tendency to reposnd to questions; difficulty with responses at times throughout session but pleasant.   General Comments       Exercises Exercises: Amputee Amputee Exercises Quad Sets: AROM;Right;10 reps;Supine Knee Extension: AROM;Right;10 reps;Seated Straight Leg Raises: AROM;Right;10 reps;Supine   Shoulder Instructions      Home Living Family/patient expects to be discharged to:: Private residence Living Arrangements: Spouse/significant other Available Help at Discharge: Family;Available 24 hours/day Type of Home: House Home Access: Stairs to enter [employer] of Steps: 4 Entrance Stairs-Rails: Right;Left Home Layout: One level     Bathroom Shower/Tub: Chief Strategy Officer: Handicapped height     Home Equipment: Walker - standard;Cane - single point;Wheelchair - manual;Crutches;Shower seat - built in          Prior Functioning/Environment Level of Independence: Independent                 OT Problem List: Decreased safety awareness;Decreased knowledge of use of DME or AE;Decreased knowledge of precautions;Pain;Decreased activity tolerance;Impaired balance (sitting and/or standing)      OT Treatment/Interventions: Self-care/ADL training;Therapeutic exercise;DME and/or AE instruction;Therapeutic activities;Cognitive remediation/compensation;Balance training;Patient/family education;Neuromuscular education;Energy conservation    OT Goals(Current goals can be found in the care plan section) Acute Rehab OT Goals Patient Stated Goal: to be able to go home OT Goal Formulation: With patient Time For Goal Achievement: 03/30/20 ADL Goals Pt Will Perform Upper Body Dressing: with set-up Pt Will  Perform Lower Body Dressing: with set-up;sitting/lateral leans;sit to/from stand;with adaptive equipment Pt Will Transfer to Toilet: with modified independence;bedside commode;regular height toilet;grab bars Pt Will Perform Tub/Shower Transfer: with supervision;tub bench;shower seat;rolling walker Pt/caregiver will Perform Home Exercise Program: Both right and left upper extremity;Increased strength;With written HEP provided Additional ADL Goal #1: pt will complete R LE residual limb wrapping at indep with use of reference sheet prn  OT Frequency: Min 2X/week   Barriers to D/C:            Co-evaluation   Reason for Co-Treatment: Complexity of the patient's impairments (multi-system involvement) PT goals addressed during session: Mobility/safety with mobility;Balance;Proper use of DME;Strengthening/ROM        AM-PAC OT "6 Clicks" Daily Activity     Outcome Measure Help from another person eating meals?: None Help from another person taking care of personal grooming?: A Little Help from another person toileting, which includes using toliet, bedpan, or urinal?: A Lot Help from another person bathing (including washing, rinsing, drying)?: A Lot Help from another person to put on and taking off regular upper body clothing?: A Little Help from another person to put on and taking off regular lower body clothing?: A Lot 6 Click Score: 16   End of Session Equipment Utilized During Treatment: Gait belt;Rolling walker Nurse Communication: Mobility status;Patient requests pain meds  Activity Tolerance: Patient tolerated treatment well Patient left: in chair;with call bell/phone within reach;with chair alarm set  OT Visit Diagnosis: Unsteadiness on feet (R26.81)  Time: 6761-9509 OT Time Calculation (min): 25 min Charges:  OT General Charges $OT Visit: 1 Visit OT Evaluation $OT Eval Low Complexity: 1 Low  Kenniyah Sasaki OTR/L acute rehab services Office: [phone]  Wilhemena Durie 03/16/2020, 1:11 PM

## 2020-03-16 NOTE — Progress Notes (Addendum)
   VASCULAR SURGERY ASSESSMENT & PLAN:   POD 1 RIGHT BKA: He had some bloody drainage on his dressing this morning so I changed this.  Begin daily dressing changes tomorrow.  His pain does not appear to be under adequate control so I have written for a full dose Dilaudid PCA.  Hopefully he will only need this for a day or 2.  I would continue his antibiotics for another 48 hours given the extent of the infection in his foot.  He is on intravenous vancomycin and Zosyn.  DVT PROPHYLAXIS: He is on subcu Lovenox.  SUBJECTIVE:   Still having significant pain at his amputation site.  PHYSICAL EXAM:   Vitals:   03/15/20 1347 03/15/20 1701 03/16/20 0056 03/16/20 0626  BP: (!) 136/95 106/71 (!) 147/90 (!) 164/97  Pulse: 79 80 86 98  Resp: 14 14 16 16   Temp:  98.4 F (36.9 C) 98.4 F (36.9 C) 98 F (36.7 C)  TempSrc:  Oral Oral Oral  SpO2: 96% 100% 97% 94%  Weight:      Height:       I changed his dressing and the incision looks fine without significant bleeding or ecchymosis.  LABS:   Lab Results  Component Value Date   WBC 11.7 (H) 03/16/2020   HGB 12.4 (L) 03/16/2020   HCT 36.9 (L) 03/16/2020   MCV 92.0 03/16/2020   PLT 366 03/16/2020   Lab Results  Component Value Date   CREATININE 0.69 03/16/2020   Lab Results  Component Value Date   INR 1.1 10/07/2019   CBG (last 3)  Recent Labs    03/15/20 1636 03/15/20 2138 03/16/20 0554  GLUCAP 268* 270* 225*    PROBLEM LIST:    Principal Problem:   Gangrene of toe of right foot (HCC) Active Problems:   Alcohol abuse   Essential hypertension   Uncontrolled type 2 diabetes mellitus with diabetic polyneuropathy, with long-term current use of insulin (HCC)   CURRENT MEDS:   . atorvastatin  80 mg Oral Daily  . buPROPion  150 mg Oral BID  . carvedilol  20 mg Oral Daily  . enoxaparin (LOVENOX) injection  40 mg Subcutaneous Q24H  . gabapentin  300 mg Oral BID  . insulin aspart  0-15 Units Subcutaneous TID WC  .  insulin aspart  4 Units Subcutaneous TID WC  . insulin glargine  8 Units Subcutaneous Daily  . lipase/protease/amylase  36,000 Units Oral TID AC  . losartan  50 mg Oral Daily  . magnesium oxide  400 mg Oral BID  . pantoprazole  40 mg Oral Daily    03/18/20 Office: [phone] 03/16/2020

## 2020-03-16 NOTE — Progress Notes (Signed)
Inpatient Rehab Admissions Coordinator Note:   Per therapy recommendations, pt was screened for CIR candidacy by Estill Dooms, PT, DPT. [facility].  Will need to refer out to other AIRs in the area.  Will let TOC know.   Please contact me with questions.   Estill Dooms, PT, DPT [phone] 03/16/20 3:36 PM

## 2020-03-16 NOTE — Progress Notes (Signed)
Physical Therapy Treatment Patient Details Name: Ricardo Howard MRN: [id number] DOB: 09-28-1960 Today's Date: 03/16/2020    History of Present Illness Pt is 60 yo male with PMH: DM, EtOH abuse, HTN, who presents with gangrene of the R foot. 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    PT Comments    Pt seen in conjunction with OT to assess/ maximize safe progression of mobility due to limitations during initial PT evaluation. Pt overall progressing towards physical therapy goals, and is motivated to work with therapy. Feel this patient would thrive in a higher intensity rehab environment, and PT updated recommendations to CIR. He was educated on positioning recommendations, and HEP. Will continue to follow and progress as able per POC.    Follow Up Recommendations  CIR;Supervision/Assistance - 24 hour     Equipment Recommendations  None recommended by PT    Recommendations for Other Services OT consult     Precautions / Restrictions Precautions Precautions: Fall;Knee Precaution Booklet Issued: No Precaution Comments: reviewed proper positioning of RLE. Restrictions Weight Bearing Restrictions: Yes RLE Weight Bearing: Non weight bearing    Mobility  Bed Mobility Overal bed mobility: Needs Assistance Bed Mobility: Supine to Sit     Supine to sit: Min assist;+2 for physical assistance;HOB elevated     General bed mobility comments: HOB elevated. OT gave hand-over-hand assist to reach for railing, and PT assisted posteriorly with trunk elevation to full sitting position.  Transfers Overall transfer level: Needs assistance Equipment used: Rolling walker (2 wheeled) Transfers: Sit to/from Stand Sit to Stand: +2 physical assistance;Mod assist         General transfer comment: Assist for power-up to full standing position. VC's for hand placement on seated surface for safety. Pt required VC's and assist from PT to extend R knee prior to initiating  stand>sit.  Ambulation/Gait Ambulation/Gait assistance: Min assist;+2 physical assistance;+2 safety/equipment Gait Distance (Feet): 5 Feet Assistive device: Rolling walker (2 wheeled) Gait Pattern/deviations: Step-to pattern;Decreased stride length Gait velocity: Decreased Gait velocity interpretation: <1.31 ft/sec, indicative of household ambulator General Gait Details: VC's for sequencing and general safety with the RW. Pt was able to ambulate ~5 feet forward and chair was pulled up behind him.   Stairs             Wheelchair Mobility    Modified Rankin (Stroke Patients Only)       Balance Overall balance assessment: Needs assistance Sitting-balance support: No upper extremity supported Sitting balance-Leahy Scale: Fair     Standing balance support: Bilateral upper extremity supported Standing balance-Leahy Scale: Poor Standing balance comment: Reliant on UE support.                            Cognition Arousal/Alertness: Awake/alert Behavior During Therapy: WFL for tasks assessed/performed;Flat affect (Mildly) Overall Cognitive Status: Impaired/Different from baseline Area of Impairment: Memory;Problem solving                     Memory: Decreased recall of precautions       Problem Solving: Slow processing;Requires verbal cues General Comments: Pt appears to require increased time and effort to verbalize responses at times. Decreased recall of positioning from yesterday's session, and confusion on who provided information (pt stating inpatient rehab but was in fact [facility]).      Exercises Amputee Exercises Quad Sets: AROM;Right;10 reps;Supine Knee Extension: AROM;Right;10 reps;Seated Straight Leg Raises: AROM;Right;10 reps;Supine    General Comments  Pertinent Vitals/Pain Pain Assessment: Faces Faces Pain Scale: Hurts little more Pain Location: RLE Pain Descriptors / Indicators: Aching Pain Intervention(s): Limited  activity within patient's tolerance;Monitored during session;Repositioned    Home Living                      Prior Function            PT Goals (current goals can now be found in the care plan section) Acute Rehab PT Goals Patient Stated Goal: return home PT Goal Formulation: With patient/family Time For Goal Achievement: 03/29/20 Potential to Achieve Goals: Good Progress towards PT goals: Progressing toward goals    Frequency    Min 4X/week      PT Plan Discharge plan needs to be updated    Co-evaluation PT/OT/SLP Co-Evaluation/Treatment: Yes Reason for Co-Treatment: Complexity of the patient's impairments (multi-system involvement);For patient/therapist safety;To address functional/ADL transfers PT goals addressed during session: Mobility/safety with mobility;Balance;Proper use of DME;Strengthening/ROM        AM-PAC PT "6 Clicks" Mobility   Outcome Measure  Help needed turning from your back to your side while in a flat bed without using bedrails?: A Little Help needed moving from lying on your back to sitting on the side of a flat bed without using bedrails?: A Lot Help needed moving to and from a bed to a chair (including a wheelchair)?: A Lot Help needed standing up from a chair using your arms (e.g., wheelchair or bedside chair)?: A Lot Help needed to walk in hospital room?: Total Help needed climbing 3-5 steps with a railing? : Total 6 Click Score: 11    End of Session Equipment Utilized During Treatment: Gait belt Activity Tolerance: Patient tolerated treatment well Patient left: in chair;with call bell/phone within reach Nurse Communication: Mobility status PT Visit Diagnosis: Difficulty in walking, not elsewhere classified (R26.2);Pain Pain - Right/Left: Right Pain - part of body: Leg     Time: 1010-1046 PT Time Calculation (min) (ACUTE ONLY): 36 min  Charges:  $Gait Training: 8-22 mins                     Conni Slipper, PT, DPT Acute  Rehabilitation Services Pager: [phone] Office: [phone]    Marylynn Pearson 03/16/2020, 11:14 AM

## 2020-03-17 DIAGNOSIS — I96 Gangrene, not elsewhere classified: Secondary | ICD-10-CM | POA: Diagnosis not present

## 2020-03-17 LAB — CBC WITH DIFFERENTIAL/PLATELET
Abs Immature Granulocytes: 0.2 10*3/uL — ABNORMAL HIGH (ref 0.00–0.07)
Basophils Absolute: 0 10*3/uL (ref 0.0–0.1)
Basophils Relative: 0 %
Eosinophils Absolute: 0.1 10*3/uL (ref 0.0–0.5)
Eosinophils Relative: 1 %
HCT: 40.4 % (ref 39.0–52.0)
Hemoglobin: 12.9 g/dL — ABNORMAL LOW (ref 13.0–17.0)
Immature Granulocytes: 2 %
Lymphocytes Relative: 12 %
Lymphs Abs: 1.5 10*3/uL (ref 0.7–4.0)
MCH: 29.9 pg (ref 26.0–34.0)
MCHC: 31.9 g/dL (ref 30.0–36.0)
MCV: 93.5 fL (ref 80.0–100.0)
Monocytes Absolute: 0.9 10*3/uL (ref 0.1–1.0)
Monocytes Relative: 7 %
Neutro Abs: 10.2 10*3/uL — ABNORMAL HIGH (ref 1.7–7.7)
Neutrophils Relative %: 78 %
Platelets: 416 10*3/uL — ABNORMAL HIGH (ref 150–400)
RBC: 4.32 MIL/uL (ref 4.22–5.81)
RDW: 14.3 % (ref 11.5–15.5)
WBC: 12.9 10*3/uL — ABNORMAL HIGH (ref 4.0–10.5)
nRBC: 0 % (ref 0.0–0.2)

## 2020-03-17 LAB — SURGICAL PATHOLOGY

## 2020-03-17 LAB — GLUCOSE, CAPILLARY
Glucose-Capillary: 121 mg/dL — ABNORMAL HIGH (ref 70–99)
Glucose-Capillary: 103 mg/dL — ABNORMAL HIGH (ref 70–99)
Glucose-Capillary: 86 mg/dL (ref 70–99)
Glucose-Capillary: 77 mg/dL (ref 70–99)

## 2020-03-17 LAB — CULTURE, BLOOD (ROUTINE X 2)
Culture: NO GROWTH
Culture: NO GROWTH
Special Requests: ADEQUATE
Special Requests: ADEQUATE

## 2020-03-17 MED ORDER — LOSARTAN POTASSIUM 50 MG PO TABS
50.0000 mg | ORAL_TABLET | Freq: Once | ORAL | Status: AC
  Administered 2020-03-17: 50 mg via ORAL
  Filled 2020-03-17: qty 1

## 2020-03-17 MED ORDER — LOSARTAN POTASSIUM 50 MG PO TABS
100.0000 mg | ORAL_TABLET | Freq: Every day | ORAL | Status: DC
  Administered 2020-03-18 – 2020-03-19 (×2): 100 mg via ORAL
  Filled 2020-03-17 (×2): qty 2

## 2020-03-17 MED ORDER — CHLORPROMAZINE HCL 25 MG PO TABS
25.0000 mg | ORAL_TABLET | Freq: Three times a day (TID) | ORAL | Status: DC | PRN
  Administered 2020-03-17 – 2020-03-18 (×2): 25 mg via ORAL
  Filled 2020-03-17 (×3): qty 1

## 2020-03-17 NOTE — Progress Notes (Signed)
Inpatient Diabetes Program Recommendations  AACE/ADA: New Consensus Statement on Inpatient Glycemic Control (2015)  Target Ranges:  Prepandial:   less than 140 mg/dL      Peak postprandial:   less than 180 mg/dL (1-2 hours)      Critically ill patients:  140 - 180 mg/dL   Lab Results  Component Value Date   GLUCAP 103 (H) 03/17/2020   HGBA1C 11.4 (H) 03/12/2020    Spoke with pt and wife at bedside regarding glucose control at home with A1c 11.4%. Pt goes to the [name] for diabetes management and last went to see his doctor 3 weeks ago. Pt was placed on a sliding scale at that time. Pt reports checking glucose 3 times a day and taking hi lantus 65 units in the morning and Novolog with meals.  Pt reports when his glucose is in the low 100's in the morning, he does not take his Novolog meal coverage. He then experiences hyperglycemia the reading after.   Encouraged pt to take meal coverage consistently and see what his trends look like. Pt and wife had questions about the insulin regimen at home. Answered all questions. Pt has all supplies needed at home. Pt just received a new meter.  Thanks, Christena Deem RN, MSN, BC-ADM Inpatient Diabetes Coordinator Team Pager [phone] (8a-5p)

## 2020-03-17 NOTE — Progress Notes (Signed)
PROGRESS NOTE    Ricardo Howard  ZOX:[id number] DOB: [birth year] DOA: 03/12/2020 PCP: [facility]   Chief Complain: Right foot pain  Brief Narrative: Patient is a 60 year old male with history of hypertension, uncontrolled diabetes type 2, severe peripheral artery disease, ongoing tobacco use, alcohol use, current pancreatitis who presented to the emergency department with complaints of increasing right foot.  He was noted to have wet gangrene of the second right toe.  Vascular surgery was consulted, started on antibiotics.  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  Continue antibiotics till tomorrow  as per vascular surgery.  PT/OT recommended CIR.TOC following  Assessment & Plan:   Principal Problem:   Gangrene of toe of right foot (HCC) Active Problems:   Alcohol abuse   Essential hypertension   Uncontrolled type 2 diabetes mellitus with diabetic polyneuropathy, with long-term current use of insulin (HCC)   Gangrene of right foot/soft tissue infection/suspected osteomyelitis/severe peripheral vascular disease: He was a started on broad-spectrum antibiotics on admission.  Blood cultures have been negative so far.  Wound culture showing  Enterococcus avium and bacteroids.  Continue Zosyn till tomorrow.  We will follow final culture report.  Vascular surgery recommended to continue IV antibiotics for 48 hours post surgery. Leukocytosis is improving. Patient seen by PT and recommended CIR on discharge.  Continue supportive care, pain management. Continue statin  Uncontrolled type diabetes mellitus with hyperglycemia: Noncompliant on insulin.  Hemoglobin A1c more than 11.  Continue current insulin regimen.  Continue to reinforce strict glycemic control, compliance.  Hypertension: Mildly hypertensive today.  Monitor blood pressure.  Continue carvedilol, losartan  Ongoing tobacco use: Counseled to quit.  Continue nicotine patch  Chronic alcohol abuse: Drinks about 6 packs of beer a day.   Monitor for withdrawal.    History of chronic pancreatitis:Denies any abdominal pain.  On Creon.            DVT prophylaxis:Lovenox Code Status: Full Family Communication:Wife at bedside Status is: Inpatient  Remains inpatient appropriate because:Inpatient level of care appropriate due to severity of illness   Dispo:  Patient From: Home  Planned Disposition: CIR  Expected discharge date: As soon as bed is available at [facility]  Medically stable for discharge: Yes       Consultants:Vascular surgery   Procedures:BKA  Antimicrobials:  Anti-infectives (From admission, onward)   Start     Dose/Rate Route Frequency Ordered Stop   03/13/20 0100  vancomycin (VANCOREADY) IVPB 1250 mg/250 mL  Status:  Discontinued        1,250 mg 166.7 mL/hr over 90 Minutes Intravenous Every 12 hours 03/12/20 1121 03/16/20 0929   03/12/20 1600  piperacillin-tazobactam (ZOSYN) IVPB 3.375 g        3.375 g 12.5 mL/hr over 240 Minutes Intravenous Every 8 hours 03/12/20 1545 03/18/20 0800   03/12/20 1045  cefTRIAXone (ROCEPHIN) 2 g in sodium chloride 0.9 % 100 mL IVPB        2 g 200 mL/hr over 30 Minutes Intravenous  Once 03/12/20 1032 03/12/20 1125   03/12/20 0945  vancomycin (VANCOREADY) IVPB 1750 mg/350 mL        1,750 mg 175 mL/hr over 120 Minutes Intravenous  Once 03/12/20 8119 03/12/20 1405      Subjective:  Patient seen and examined at the bedside this morning.  Hemodynamically stable.  Comfortable today.  Pain is well controlled.  Wife at bedside. Objective: Vitals:   03/16/20 1251 03/16/20 1640 03/17/20 0025 03/17/20 0556  BP: (!) 168/95 (!) 157/97 Marland Kitchen)  164/98 (!) 161/98  Pulse: 92 89 92 93  Resp: 16 16 16 18   Temp: 97.6 F (36.4 C) 98.4 F (36.9 C) 98.2 F (36.8 C) 98.9 F (37.2 C)  TempSrc: Oral Oral Oral Oral  SpO2: 100% 95% 95% 96%  Weight:      Height:       No intake or output data in the 24 hours ending 03/17/20 0832 Filed Weights   03/12/20 0906 03/12/20 2048  03/15/20 1109  Weight: 84.8 kg 87.1 kg 87.1 kg    Examination:  General exam: Appears calm and comfortable ,Not in distress,average built HEENT:PERRL,Oral mucosa moist, Ear/Nose normal on gross exam Respiratory system: Bilateral equal air entry, normal vesicular breath sounds, no wheezes or crackles  Cardiovascular system: S1 & S2 heard, RRR. No JVD, murmurs, rubs, gallops or clicks. Gastrointestinal system: Abdomen is nondistended, soft and nontender. No organomegaly or masses felt. Normal bowel sounds heard. Central nervous system: Alert and oriented. No focal neurological deficits. Extremities: No edema, no clubbing ,no cyanosis, right BKA Skin: No rashes, lesions or ulcers,no icterus ,no pallor    Data Reviewed: I have personally reviewed following labs and imaging studies  CBC: Recent Labs  Lab 03/12/20 0956 03/13/20 0310 03/14/20 0846 03/15/20 0248 03/16/20 0220 03/17/20 0142  WBC 20.1* 19.6* 18.2* 16.8* 11.7* 12.9*  NEUTROABS 17.4*  --  14.7* 12.6* 8.9* 10.2*  HGB 12.6* 11.9* 11.6* 12.4* 12.4* 12.9*  HCT 38.8* 34.9* 35.2* 38.1* 36.9* 40.4  MCV 93.9 90.6 92.9 92.7 92.0 93.5  PLT 287 325 342 351 366 416*   Basic Metabolic Panel: Recent Labs  Lab 03/12/20 0956 03/13/20 0310 03/14/20 0846 03/15/20 0248 03/16/20 0220  NA 135 136 135 136 139  K 4.7 4.1 3.3* 3.9 4.2  CL 100 104 104 106 109  CO2 22 21* 20* 18* 21*  GLUCOSE 433* 267* 211* 187* 279*  BUN 11 10 6 6 7   CREATININE 0.74 0.67 0.68 0.56* 0.69  CALCIUM 9.2 8.2* 7.6* 7.6* 7.5*  MG  --   --  1.6*  --   --    GFR: Estimated Creatinine Clearance: 105.4 mL/min (by C-G formula based on SCr of 0.69 mg/dL). Liver Function Tests: No results for input(s): AST, ALT, ALKPHOS, BILITOT, PROT, ALBUMIN in the last 168 hours. No results for input(s): LIPASE, AMYLASE in the last 168 hours. No results for input(s): AMMONIA in the last 168 hours. Coagulation Profile: No results for input(s): INR, PROTIME in the last 168  hours. Cardiac Enzymes: No results for input(s): CKTOTAL, CKMB, CKMBINDEX, TROPONINI in the last 168 hours. BNP (last 3 results) No results for input(s): PROBNP in the last 8760 hours. HbA1C: No results for input(s): HGBA1C in the last 72 hours. CBG: Recent Labs  Lab 03/16/20 0554 03/16/20 1118 03/16/20 1617 03/16/20 2154 03/17/20 0601  GLUCAP 225* 194* 154* 124* 121*   Lipid Profile: No results for input(s): CHOL, HDL, LDLCALC, TRIG, CHOLHDL, LDLDIRECT in the last 72 hours. Thyroid Function Tests: No results for input(s): TSH, T4TOTAL, FREET4, T3FREE, THYROIDAB in the last 72 hours. Anemia Panel: No results for input(s): VITAMINB12, FOLATE, FERRITIN, TIBC, IRON, RETICCTPCT in the last 72 hours. Sepsis Labs: Recent Labs  Lab 03/12/20 0956 03/12/20 1342  LATICACIDVEN 2.8* 1.5    Recent Results (from the past 240 hour(s))  SARS CORONAVIRUS 2 (TAT 6-24 HRS) Nasopharyngeal Nasopharyngeal Swab     Status: None   Collection Time: 03/12/20  9:35 AM   Specimen: Nasopharyngeal Swab  Result Value  Ref Range Status   SARS Coronavirus 2 NEGATIVE NEGATIVE Final    Comment: (NOTE) SARS-CoV-2 target nucleic acids are NOT DETECTED.  The SARS-CoV-2 RNA is generally detectable in upper and lower respiratory specimens during the acute phase of infection. Negative results do not preclude SARS-CoV-2 infection, do not rule out co-infections with other pathogens, and should not be used as the sole basis for treatment or other patient management decisions. Negative results must be combined with clinical observations, patient history, and epidemiological information. The expected result is Negative.  Fact Sheet for Patients: HairSlick.no  Fact Sheet for Healthcare Providers: quierodirigir.com  This test is not yet approved or cleared by the [place] FDA and  has been authorized for detection and/or diagnosis of SARS-CoV-2 by FDA  under an Emergency Use Authorization (EUA). This EUA will remain  in effect (meaning this test can be used) for the duration of the COVID-19 declaration under Se ction 564(b)(1) of the Act, 21 U.S.C. section 360bbb-3(b)(1), unless the authorization is terminated or revoked sooner.  Performed at [facility], [address] [place], [place] [address]   Blood culture (routine x 2)     Status: None (Preliminary result)   Collection Time: 03/12/20  9:37 AM   Specimen: BLOOD  Result Value Ref Range Status   Specimen Description BLOOD LEFT ANTECUBITAL  Final   Special Requests   Final    BOTTLES DRAWN AEROBIC AND ANAEROBIC Blood Culture adequate volume   Culture   Final    NO GROWTH 4 DAYS Performed at [facility], [address], [place], [place] [address]    Report Status PENDING  Incomplete  Blood culture (routine x 2)     Status: None (Preliminary result)   Collection Time: 03/12/20  8:55 PM   Specimen: BLOOD  Result Value Ref Range Status   Specimen Description BLOOD LEFT ANTECUBITAL  Final   Special Requests   Final    BOTTLES DRAWN AEROBIC ONLY Blood Culture adequate volume   Culture   Final    NO GROWTH 4 DAYS Performed at [facility], [address] [place], [place] [address]    Report Status PENDING  Incomplete  Surgical pcr screen     Status: None   Collection Time: 03/12/20  9:04 PM   Specimen: Nasal Mucosa; Nasal Swab  Result Value Ref Range Status   MRSA, PCR NEGATIVE NEGATIVE Final   Staphylococcus aureus NEGATIVE NEGATIVE Final    Comment: (NOTE) The Xpert SA Assay (FDA approved for NASAL specimens in patients 71 years of age and older), is one component of a comprehensive surveillance program. It is not intended to diagnose infection nor to guide or monitor treatment. Performed at [facility], [address], [place], [place] [address]   Aerobic/Anaerobic Culture (surgical/deep wound)     Status: None (Preliminary result)    Collection Time: 03/13/20  8:07 AM   Specimen: Wound  Result Value Ref Range Status   Specimen Description WOUND  Final   Special Requests NONE  Final   Gram Stain   Final    NO WBC SEEN ABUNDANT GRAM NEGATIVE RODS MODERATE GRAM POSITIVE COCCI    Culture   Final    RARE ENTEROCOCCUS AVIUM FEW BACTEROIDES THETAIOTAOMICRON CULTURE REINCUBATED FOR BETTER GROWTH Performed at [facility], [address], [place], [place] [address]    Report Status PENDING  Incomplete   Organism ID, Bacteria ENTEROCOCCUS AVIUM  Final  Susceptibility   Enterococcus avium - MIC*    AMPICILLIN <=2 SENSITIVE Sensitive     VANCOMYCIN <=0.5 SENSITIVE Sensitive     GENTAMICIN SYNERGY SENSITIVE Sensitive     * RARE ENTEROCOCCUS AVIUM         Radiology Studies: No results found.      Scheduled Meds: . atorvastatin  80 mg Oral Daily  . buPROPion  150 mg Oral BID  . carvedilol  20 mg Oral Daily  . enoxaparin (LOVENOX) injection  40 mg Subcutaneous Q24H  . gabapentin  300 mg Oral BID  . insulin aspart  0-15 Units Subcutaneous TID WC  . insulin aspart  4 Units Subcutaneous TID WC  . insulin glargine  16 Units Subcutaneous Daily  . lipase/protease/amylase  36,000 Units Oral TID AC  . losartan  50 mg Oral Daily  . magnesium oxide  400 mg Oral BID  . pantoprazole  40 mg Oral Daily   Continuous Infusions: . piperacillin-tazobactam (ZOSYN)  IV 3.375 g (03/17/20 0520)     LOS: 5 days    Time spent: 25 mins.More than 50% of that time was spent in counseling and/or coordination of care.      Burnadette Pop, MD [facility] P1/27/2022, 8:32 AM

## 2020-03-17 NOTE — Progress Notes (Signed)
Physical Therapy Treatment Patient Details Name: Ricardo Howard MRN: [id number] DOB: 11-30-1960 Today's Date: 03/17/2020    History of Present Illness Pt is 60 yo male with PMH: DM, EtOH abuse, HTN, who presents with gangrene of the R foot. 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    PT Comments    Pt progressing towards physical therapy goals. Was able to perform transfers and ambulation with less assist today - grossly +2 min assist and RW for support. Chair follow utilized to progress ambulation this session. Pt tolerated treatment well without complaints of pain after RN provided IV pain meds during session. Continue to feel this patient will benefit from continued intensive skilled therapy intervention to maximize functional independence and safety prior to return home with wife. Will continue to follow.   Follow Up Recommendations  CIR;Supervision/Assistance - 24 hour     Equipment Recommendations  None recommended by PT    Recommendations for Other Services OT consult     Precautions / Restrictions Precautions Precautions: Fall;Knee Precaution Booklet Issued: No Precaution Comments: pain management Restrictions Weight Bearing Restrictions: Yes RLE Weight Bearing: Non weight bearing    Mobility  Bed Mobility Overal bed mobility: Needs Assistance Bed Mobility: Supine to Sit     Supine to sit: Min assist;HOB elevated     General bed mobility comments: VC's to reach for bed rail. HOB elevated, and pt was able to transition to EOB with min assist with bed pad to scoot hips completely around.  Transfers Overall transfer level: Needs assistance Equipment used: Rolling walker (2 wheeled) Transfers: Sit to/from Stand Sit to Stand: Min assist;+2 physical assistance         General transfer comment: From EOB, pt required +2 assist to power-up to full stand and transition hands from bed to walker. From recliner, pt was able to power-up from arm rests with +1 assist and increased  time to transition hands from chair to walker. VC's for hand placement on seated surface for safety. Pt required VC's and assist from PT to extend R knee prior to initiating stand>sit.  Ambulation/Gait Ambulation/Gait assistance: Min assist;+2 safety/equipment Gait Distance (Feet): 30 Feet (10', seated rest, 20') Assistive device: Rolling walker (2 wheeled) Gait Pattern/deviations: Step-to pattern;Decreased stride length;Trunk flexed Gait velocity: Decreased Gait velocity interpretation: <1.31 ft/sec, indicative of household ambulator General Gait Details: VC's for sequencing and general safety with the RW. Pt was able to ambulate ~10 feet before requiring a seated rest break due to pain. RN provided IV pain meds and he was able to ambulate ~20 more feet. Chair follow utilized.   Stairs             Wheelchair Mobility    Modified Rankin (Stroke Patients Only)       Balance Overall balance assessment: Needs assistance Sitting-balance support: No upper extremity supported Sitting balance-Leahy Scale: Fair     Standing balance support: Bilateral upper extremity supported Standing balance-Leahy Scale: Poor Standing balance comment: Reliant on UE support.                            Cognition Arousal/Alertness: Awake/alert Behavior During Therapy: WFL for tasks assessed/performed;Flat affect Overall Cognitive Status: Impaired/Different from baseline Area of Impairment: Problem solving;Memory                     Memory: Decreased recall of precautions       Problem Solving: Slow processing;Requires verbal cues General Comments: noted  slow processing and increased time to verbalize responses      Exercises Amputee Exercises Quad Sets: AROM;Right;10 reps;Supine Knee Extension: AROM;Right;10 reps;Seated    General Comments        Pertinent Vitals/Pain Pain Assessment: 0-10 Pain Score: 6  (at rest) Pain Location: RLE Pain Descriptors /  Indicators: Aching;Constant Pain Intervention(s): Limited activity within patient's tolerance;Monitored during session;Premedicated before session;Repositioned;Patient requesting pain meds-RN notified;RN gave pain meds during session    Home Living                      Prior Function            PT Goals (current goals can now be found in the care plan section) Acute Rehab PT Goals Patient Stated Goal: to be able to go home to family (Granddaughter), and eventually get a prosthetic PT Goal Formulation: With patient/family Time For Goal Achievement: 03/29/20 Potential to Achieve Goals: Good Progress towards PT goals: Progressing toward goals    Frequency    Min 4X/week      PT Plan Current plan remains appropriate    Co-evaluation              AM-PAC PT "6 Clicks" Mobility   Outcome Measure  Help needed turning from your back to your side while in a flat bed without using bedrails?: A Little Help needed moving from lying on your back to sitting on the side of a flat bed without using bedrails?: A Lot Help needed moving to and from a bed to a chair (including a wheelchair)?: A Lot Help needed standing up from a chair using your arms (e.g., wheelchair or bedside chair)?: A Lot Help needed to walk in hospital room?: Total Help needed climbing 3-5 steps with a railing? : Total 6 Click Score: 11    End of Session Equipment Utilized During Treatment: Gait belt Activity Tolerance: Patient tolerated treatment well Patient left: in chair;with call bell/phone within reach Nurse Communication: Mobility status PT Visit Diagnosis: Difficulty in walking, not elsewhere classified (R26.2);Pain Pain - Right/Left: Right Pain - part of body: Leg     Time: 8250-5397 PT Time Calculation (min) (ACUTE ONLY): 41 min  Charges:  $Gait Training: 38-52 mins                     Conni Slipper, PT, DPT Acute Rehabilitation Services Pager: [phone] Office: [phone]     Marylynn Pearson 03/17/2020, 1:39 PM

## 2020-03-17 NOTE — Progress Notes (Addendum)
  Progress Note    03/17/2020 7:32 AM 2 Days Post-Op  Subjective:  Says the pain is better.  Nurse changed dressing this morning.  He says there was no seepage on the dressing.   Afebrile  Vitals:   03/17/20 0025 03/17/20 0556  BP: (!) 164/98 (!) 161/98  Pulse: 92 93  Resp: 16 18  Temp: 98.2 F (36.8 C) 98.9 F (37.2 C)  SpO2: 95% 96%    Physical Exam: Incisions:  Dressing not removed as it was changed earlier this morning.  It is dry and in tact.    CBC    Component Value Date/Time   WBC 12.9 (H) 03/17/2020 0142   RBC 4.32 03/17/2020 0142   HGB 12.9 (L) 03/17/2020 0142   HCT 40.4 03/17/2020 0142   PLT 416 (H) 03/17/2020 0142   MCV 93.5 03/17/2020 0142   MCH 29.9 03/17/2020 0142   MCHC 31.9 03/17/2020 0142   RDW 14.3 03/17/2020 0142   LYMPHSABS 1.5 03/17/2020 0142   MONOABS 0.9 03/17/2020 0142   EOSABS 0.1 03/17/2020 0142   BASOSABS 0.0 03/17/2020 0142    BMET    Component Value Date/Time   NA 139 03/16/2020 0220   K 4.2 03/16/2020 0220   CL 109 03/16/2020 0220   CO2 21 (L) 03/16/2020 0220   GLUCOSE 279 (H) 03/16/2020 0220   BUN 7 03/16/2020 0220   CREATININE 0.69 03/16/2020 0220   CALCIUM 7.5 (L) 03/16/2020 0220   GFRNONAA >60 03/16/2020 0220   GFRAA >60 10/10/2019 0620    INR    Component Value Date/Time   INR 1.1 10/07/2019 1841    No intake or output data in the 24 hours ending 03/17/20 0732   Assessment/Plan:  60 y.o. male is s/p right below knee amputation  2 Days Post-Op  -PT/OT recommending CIR, however, per IP rehab, [facility] does not contract with his insurance and will need to refer out to other AIRs in the area.  TOC was contacted.  -continue PT/OT and OOB -pt will return to our office in 4 weeks for staple removal.   -continue daily dressing changes.    Doreatha Massed, PA-C [facility] [phone] 03/17/2020 7:32 AM  I have interviewed the patient and examined the patient. I agree with the findings by the  PA.  His pain is under better control today.  Continue daily dressing changes.  Cari Caraway, MD [phone]

## 2020-03-17 NOTE — Social Work (Signed)
CSW sent referral to [facility] rehab for review. CSW will follow.   Jimmy Picket, Theresia Majors, [place] Clinical Social Worker [phone]

## 2020-03-18 DIAGNOSIS — I96 Gangrene, not elsewhere classified: Secondary | ICD-10-CM | POA: Diagnosis not present

## 2020-03-18 LAB — AEROBIC/ANAEROBIC CULTURE W GRAM STAIN (SURGICAL/DEEP WOUND): Gram Stain: NONE SEEN

## 2020-03-18 LAB — GLUCOSE, CAPILLARY
Glucose-Capillary: 84 mg/dL (ref 70–99)
Glucose-Capillary: 83 mg/dL (ref 70–99)
Glucose-Capillary: 300 mg/dL — ABNORMAL HIGH (ref 70–99)
Glucose-Capillary: 131 mg/dL — ABNORMAL HIGH (ref 70–99)

## 2020-03-18 LAB — SARS CORONAVIRUS 2 BY RT PCR (HOSPITAL ORDER, PERFORMED IN ~~LOC~~ HOSPITAL LAB): SARS Coronavirus 2: NEGATIVE

## 2020-03-18 MED ORDER — CHLORPROMAZINE HCL 25 MG PO TABS: 25.0000 mg | ORAL_TABLET | Freq: Three times a day (TID) | ORAL | Status: DC | PRN

## 2020-03-18 MED ORDER — LOSARTAN POTASSIUM 100 MG PO TABS: 100.0000 mg | ORAL_TABLET | Freq: Every day | ORAL | Status: DC

## 2020-03-18 MED ORDER — POLYETHYLENE GLYCOL 3350 17 G PO PACK: 17.0000 g | PACK | Freq: Every day | ORAL | 0 refills | Status: DC | PRN

## 2020-03-18 MED ORDER — OXYCODONE-ACETAMINOPHEN 5-325 MG PO TABS: 1.0000 | ORAL_TABLET | ORAL | 0 refills | Status: DC | PRN

## 2020-03-18 MED ORDER — MAGNESIUM OXIDE 400 (241.3 MG) MG PO TABS: 400.0000 mg | ORAL_TABLET | Freq: Two times a day (BID) | ORAL | Status: DC

## 2020-03-18 NOTE — Progress Notes (Signed)
   VASCULAR SURGERY ASSESSMENT & PLAN:   POD 3 RIGHT BKA: I changed his dressing this morning.  This looks good.  Continue daily dressing changes.  Awaiting rehab.   SUBJECTIVE:   No specific complaints.  PHYSICAL EXAM:   Vitals:   03/17/20 1542 03/17/20 2356 03/18/20 0512 03/18/20 0543  BP: (!) 151/104 124/79 (!) 156/101 (!) 146/90  Pulse: 86 92 89   Resp: 19 18 18    Temp: 97.8 F (36.6 C) 98.5 F (36.9 C) 98 F (36.7 C)   TempSrc: Oral Oral Oral   SpO2: 94% 95% 95%   Weight:      Height:       His right below the knee amputation site looks good.  LABS:   Lab Results  Component Value Date   WBC 12.9 (H) 03/17/2020   HGB 12.9 (L) 03/17/2020   HCT 40.4 03/17/2020   MCV 93.5 03/17/2020   PLT 416 (H) 03/17/2020   Lab Results  Component Value Date   CREATININE 0.69 03/16/2020   Lab Results  Component Value Date   INR 1.1 10/07/2019   CBG (last 3)  Recent Labs    03/17/20 1541 03/17/20 2123 03/18/20 0557  GLUCAP 86 77 84    PROBLEM LIST:    Principal Problem:   Gangrene of toe of right foot (HCC) Active Problems:   Alcohol abuse   Essential hypertension   Uncontrolled type 2 diabetes mellitus with diabetic polyneuropathy, with long-term current use of insulin (HCC)   CURRENT MEDS:   . atorvastatin  80 mg Oral Daily  . buPROPion  150 mg Oral BID  . carvedilol  20 mg Oral Daily  . enoxaparin (LOVENOX) injection  40 mg Subcutaneous Q24H  . gabapentin  300 mg Oral BID  . insulin aspart  0-15 Units Subcutaneous TID WC  . insulin aspart  4 Units Subcutaneous TID WC  . insulin glargine  16 Units Subcutaneous Daily  . lipase/protease/amylase  36,000 Units Oral TID AC  . losartan  100 mg Oral Daily  . magnesium oxide  400 mg Oral BID  . pantoprazole  40 mg Oral Daily    03/20/20 Office: [phone] 03/18/2020

## 2020-03-18 NOTE — Progress Notes (Signed)
Occupational Therapy Treatment Patient Details Name: Ricardo Howard MRN: [id number] DOB: 1960-09-18 Today's Date: 03/18/2020    History of present illness Pt is 60 yo male with PMH: DM, EtOH abuse, HTN, who presents with gangrene of the R foot. 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   OT comments  Pt making good progress with functional goals. OT will continue to follow acutely to maximize level of function and safety  Follow Up Recommendations   (Inpt rehab)    Equipment Recommendations  Other (comment) (TBD at inpt rehab, reacher, LH bath sponge)    Recommendations for Other Services      Precautions / Restrictions Precautions Precautions: Fall;Knee Precaution Booklet Issued: No Precaution Comments: pain management Restrictions Weight Bearing Restrictions: Yes RLE Weight Bearing: Non weight bearing       Mobility Bed Mobility Overal bed mobility: Needs Assistance Bed Mobility: Supine to Sit     Supine to sit: Min assist;HOB elevated Sit to supine: Min assist   General bed mobility comments: pt in recliner upon arrival  Transfers Overall transfer level: Needs assistance Equipment used: Rolling walker (2 wheeled) Transfers: Sit to/from Stand Sit to Stand: Min assist         General transfer comment: From EOB, pt required +2 assist to power-up to full stand and transition hands from bed to walker. From recliner, pt was able to power-up from arm rests with +1 assist and increased time to transition hands from chair to walker. VC's for hand placement on seated surface for safety. Pt required VC's and assist from PT to extend R knee prior to initiating stand>sit.    Balance Overall balance assessment: Needs assistance Sitting-balance support: No upper extremity supported Sitting balance-Leahy Scale: Fair     Standing balance support: Bilateral upper extremity supported;During functional activity Standing balance-Leahy Scale: Poor Standing balance comment: Reliant on UE  support.                           ADL either performed or assessed with clinical judgement   ADL Overall ADL's : Needs assistance/impaired     Grooming: Wash/dry hands;Wash/dry face;Set up;Sitting   Upper Body Bathing: Supervision/ safety;Set up;Sitting   Lower Body Bathing: Moderate assistance;Sitting/lateral leans Lower Body Bathing Details (indicate cue type and reason): simulated seated in recliner Upper Body Dressing : Set up;Supervision/safety;Sitting   Lower Body Dressing: Sit to/from stand;Moderate assistance   Toilet Transfer: Moderate assistance;Minimal assistance;Cueing for safety;RW;Stand-pivot;Cueing for sequencing   Toileting- Clothing Manipulation and Hygiene: Maximal assistance;Sit to/from stand       Functional mobility during ADLs: Moderate assistance;Minimal assistance;Rolling walker;Cueing for safety;Cueing for sequencing       Vision Baseline Vision/History: No visual deficits Patient Visual Report: No change from baseline     Perception     Praxis      Cognition Arousal/Alertness: Awake/alert Behavior During Therapy: WFL for tasks assessed/performed;Flat affect Overall Cognitive Status: Impaired/Different from baseline Area of Impairment: Problem solving;Memory                     Memory: Decreased recall of precautions       Problem Solving: Slow processing;Requires verbal cues General Comments: noted slow processing and increased time to verbalize responses        Exercises     Shoulder Instructions       General Comments      Pertinent Vitals/ Pain       Pain Assessment: Faces Faces Pain  Scale: Hurts little more Pain Location: RLE Pain Descriptors / Indicators: Aching;Constant;Operative site guarding Pain Intervention(s): Monitored during session;Repositioned  Home Living                                          Prior Functioning/Environment              Frequency  Min 2X/week         Progress Toward Goals  OT Goals(current goals can now be found in the care plan section)  Progress towards OT goals: Progressing toward goals  Acute Rehab OT Goals Patient Stated Goal: to be able to go home to family (Granddaughter), and eventually get a prosthetic  Plan Discharge plan remains appropriate    Co-evaluation                 AM-PAC OT "6 Clicks" Daily Activity     Outcome Measure   Help from another person eating meals?: None Help from another person taking care of personal grooming?: A Little Help from another person toileting, which includes using toliet, bedpan, or urinal?: A Little Help from another person bathing (including washing, rinsing, drying)?: A Lot Help from another person to put on and taking off regular upper body clothing?: A Little Help from another person to put on and taking off regular lower body clothing?: A Lot 6 Click Score: 17    End of Session Equipment Utilized During Treatment: Gait belt;Rolling walker  OT Visit Diagnosis: Unsteadiness on feet (R26.81);Other abnormalities of gait and mobility (R26.89)   Activity Tolerance Patient tolerated treatment well   Patient Left in chair;with call bell/phone within reach;with family/visitor present   Nurse Communication          Time: 9562-1308 OT Time Calculation (min): 26 min  Charges: OT General Charges $OT Visit: 1 Visit OT Treatments $Self Care/Home Management : 8-22 mins $Therapeutic Activity: 8-22 mins     Galen Manila 03/18/2020, 4:06 PM

## 2020-03-18 NOTE — Progress Notes (Signed)
Physical Therapy Treatment Patient Details Name: Ricardo Howard MRN: [id number] DOB: [birth year] Today's Date: 03/18/2020    History of Present Illness Pt is 60 yo male with PMH: DM, EtOH abuse, HTN, who presents with gangrene of the R foot. 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    PT Comments    Pt progressing towards physical therapy goals. Was able to perform transfers and ambulation with up to +2 min assist for power-up, balance support and safety. Chair follow utilized for all  OOB mobility. Noted in the chart that pt complaining of SOB at rest since surgery. Confirmed with pt today and he reports he feels short of breath several times throughout the day even when not performing any activity. Pt on RA throughout session and O2 sats initially ~96%, decreasing to 90% by end of gait training. Pt was educated on positioning recommendations, and therapeutic exercise was deferred due to increased pain at end of session. He anticipates d/c to CIR today. Will continue to follow.    Follow Up Recommendations  CIR;Supervision/Assistance - 24 hour     Equipment Recommendations  None recommended by PT    Recommendations for Other Services OT consult     Precautions / Restrictions Precautions Precautions: Fall;Knee Precaution Booklet Issued: No Precaution Comments: pain management Restrictions Weight Bearing Restrictions: Yes RLE Weight Bearing: Non weight bearing    Mobility  Bed Mobility Overal bed mobility: Needs Assistance Bed Mobility: Supine to Sit     Supine to sit: Min assist;HOB elevated Sit to supine: Min assist   General bed mobility comments: VC's to reach for bed rail. HOB elevated, and pt was able to transition to EOB with min assist with bed pad to scoot hips completely around.  Transfers Overall transfer level: Needs assistance Equipment used: Rolling walker (2 wheeled) Transfers: Sit to/from Stand Sit to Stand: Min assist;+2 physical assistance         General  transfer comment: From EOB, pt required +2 assist to power-up to full stand and transition hands from bed to walker. From recliner, pt was able to power-up from arm rests with +1 assist and increased time to transition hands from chair to walker. VC's for hand placement on seated surface for safety. Pt required VC's and assist from PT to extend R knee prior to initiating stand>sit.  Ambulation/Gait Ambulation/Gait assistance: Min assist;+2 safety/equipment Gait Distance (Feet): 60 Feet Assistive device: Rolling walker (2 wheeled) Gait Pattern/deviations: Step-to pattern;Decreased stride length;Trunk flexed Gait velocity: Decreased Gait velocity interpretation: <1.31 ft/sec, indicative of household ambulator General Gait Details: VC's for sequencing with the RW, improved posture, and general safety. Chair follow utilized. 1 seated rest break required.   Stairs             Wheelchair Mobility    Modified Rankin (Stroke Patients Only)       Balance Overall balance assessment: Needs assistance Sitting-balance support: No upper extremity supported Sitting balance-Leahy Scale: Fair     Standing balance support: Bilateral upper extremity supported Standing balance-Leahy Scale: Poor Standing balance comment: Reliant on UE support.                            Cognition Arousal/Alertness: Awake/alert Behavior During Therapy: WFL for tasks assessed/performed;Flat affect Overall Cognitive Status: Impaired/Different from baseline Area of Impairment: Problem solving;Memory                     Memory: Decreased recall of precautions  Problem Solving: Slow processing;Requires verbal cues General Comments: noted slow processing and increased time to verbalize responses      Exercises      General Comments        Pertinent Vitals/Pain Pain Assessment: Faces Faces Pain Scale: Hurts even more Pain Location: RLE Pain Descriptors / Indicators:  Aching;Constant;Operative site guarding Pain Intervention(s): Limited activity within patient's tolerance;Monitored during session;Repositioned    Home Living                      Prior Function            PT Goals (current goals can now be found in the care plan section) Acute Rehab PT Goals Patient Stated Goal: to be able to go home to family (Granddaughter), and eventually get a prosthetic PT Goal Formulation: With patient/family Time For Goal Achievement: 03/29/20 Potential to Achieve Goals: Good Progress towards PT goals: Progressing toward goals    Frequency    Min 4X/week      PT Plan Current plan remains appropriate    Co-evaluation              AM-PAC PT "6 Clicks" Mobility   Outcome Measure  Help needed turning from your back to your side while in a flat bed without using bedrails?: A Little Help needed moving from lying on your back to sitting on the side of a flat bed without using bedrails?: A Little Help needed moving to and from a bed to a chair (including a wheelchair)?: A Little Help needed standing up from a chair using your arms (e.g., wheelchair or bedside chair)?: A Lot Help needed to walk in hospital room?: A Lot Help needed climbing 3-5 steps with a railing? : Total 6 Click Score: 14    End of Session Equipment Utilized During Treatment: Gait belt Activity Tolerance: Patient tolerated treatment well Patient left: in chair;with call bell/phone within reach Nurse Communication: Mobility status PT Visit Diagnosis: Difficulty in walking, not elsewhere classified (R26.2);Pain Pain - Right/Left: Right Pain - part of body: Leg     Time: 8182-9937 PT Time Calculation (min) (ACUTE ONLY): 32 min  Charges:  $Gait Training: 23-37 mins                     Ricardo Howard, PT, DPT Acute Rehabilitation Services Pager: [phone] Office: [phone]    Ricardo Howard 03/18/2020, 1:02 PM

## 2020-03-18 NOTE — Progress Notes (Signed)
  Progress Note    03/18/2020 7:39 AM 3 Days Post-Op  Subjective:  No complaints regarding leg. Says he is doing well. He does report several days of more labored breathing. Says at times he feels like he cant catch his breath. Has not mentioned this to anyone   Vitals:   03/18/20 0512 03/18/20 0543  BP: (!) 156/101 (!) 146/90  Pulse: 89   Resp: 18   Temp: 98 F (36.7 C)   SpO2: 95%    Physical Exam: Cardiac:  regular Lungs: non labored, Sp02 95% on room air Incisions: right BKA dressings just changed. Clean dry and intact Extremities:  Well perfused and warm Neurologic: alert and oriented  CBC    Component Value Date/Time   WBC 12.9 (H) 03/17/2020 0142   RBC 4.32 03/17/2020 0142   HGB 12.9 (L) 03/17/2020 0142   HCT 40.4 03/17/2020 0142   PLT 416 (H) 03/17/2020 0142   MCV 93.5 03/17/2020 0142   MCH 29.9 03/17/2020 0142   MCHC 31.9 03/17/2020 0142   RDW 14.3 03/17/2020 0142   LYMPHSABS 1.5 03/17/2020 0142   MONOABS 0.9 03/17/2020 0142   EOSABS 0.1 03/17/2020 0142   BASOSABS 0.0 03/17/2020 0142    BMET    Component Value Date/Time   NA 139 03/16/2020 0220   K 4.2 03/16/2020 0220   CL 109 03/16/2020 0220   CO2 21 (L) 03/16/2020 0220   GLUCOSE 279 (H) 03/16/2020 0220   BUN 7 03/16/2020 0220   CREATININE 0.69 03/16/2020 0220   CALCIUM 7.5 (L) 03/16/2020 0220   GFRNONAA >60 03/16/2020 0220   GFRAA >60 10/10/2019 0620    INR    Component Value Date/Time   INR 1.1 10/07/2019 1841     Intake/Output Summary (Last 24 hours) at 03/18/2020 0739 Last data filed at 03/18/2020 0544 Gross per 24 hour  Intake 1080 ml  Output 920 ml  Net 160 ml     Assessment/Plan:  60 y.o. male is s/p right BKA 3 Days Post-Op   Doing well post op. Pain well controlled. Continue PT/ OT and oob. Working on rehab admission at [facility] Has follow up appointment scheduled for staple removal in 4 weeks. Continue daily dressing changes  DVT prophylaxis:  lovenox   Graceann Congress,  PA-C [facility] [phone] 03/18/2020 7:39 AM

## 2020-03-18 NOTE — TOC Progression Note (Signed)
Transition of Care [facility]) - Progression Note    Patient Details  Name: Ricardo Howard MRN: [id number] Date of Birth: [birth year]  [facility] CM/SW Contact  Jimmy Picket, [place] Phone Number: 03/18/2020, 3:11 PM  Clinical Narrative:     Pt was approved for [facility] inpatient rehab. Pt will DC tomorrow morning. Pts covid test is negative. [facility] has received insurance auth. Transportation will need to be arranged in the AM.   The accepting physician is Dr. Alison Murray and the call to report number is [phone]       Expected Discharge Plan and Services           Expected Discharge Date: 03/18/20                                     Social Determinants of Health (SDOH) Interventions    Readmission Risk Interventions No flowsheet data found.  Jimmy Picket, Theresia Majors, [place] Clinical Social Worker [phone]

## 2020-03-18 NOTE — Discharge Summary (Signed)
Physician Discharge Summary  Ricardo Howard ZOX:[id number] DOB: Jul 15, 1960 DOA: 03/12/2020  PCP: [facility]  Admit date: 03/12/2020 Discharge date: 03/18/2020  Admitted From: Home Disposition:  CIR  Discharge Condition:Stable CODE STATUS:FULL Diet recommendation: Heart Healthy   Brief/Interim Summary:  Patient is a 60 year old male with history of hypertension, uncontrolled diabetes type 2, severe peripheral artery disease, ongoing tobacco use, alcohol use, current pancreatitis who presented to the emergency department with complaints of increasing right foot.  He was noted to have wet gangrene of the second right toe.  Vascular surgery was consulted, started on antibiotics.  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    He finished antibiotics course. PT/OT recommended CIR. He is medically stable for discharge to CIR today.  He will follow-up with vascular surgery in 4 weeks  Following problems were addressed during his hospitalization:    Gangrene of right foot/soft tissue infection/suspected osteomyelitis/severe peripheral vascular disease: He was a started on broad-spectrum antibiotics on admission.  Blood cultures have been negative so far.  Wound culture showed  Enterococcus avium and bacteroids. He was treated with Zosyn for 48 hours post surgery. Abx stopped now Leukocytosis improved . Patient seen by PT and recommended CIR on discharge.  Continue supportive care, pain management. Continue statin  Uncontrolled type diabetes mellitus with hyperglycemia: Noncompliant on insulin.  Hemoglobin A1c more than 11.  Continue home  insulin regimen.  Continue to reinforce strict glycemic control, compliance.  Hypertension: Mildly hypertensive today.    Continue carvedilol, losartan.  Continue to titrate the medication if needed  Ongoing tobacco use: Counseled to quit. He was on  nicotine patch  Chronic alcohol abuse: Drinks about 6 packs of beer a day. Continue thiamine and folic  acid  History of chronic pancreatitis:Denies any abdominal pain.  On Creon.    Discharge Diagnoses:  Principal Problem:   Gangrene of toe of right foot (HCC) Active Problems:   Alcohol abuse   Essential hypertension   Uncontrolled type 2 diabetes mellitus with diabetic polyneuropathy, with long-term current use of insulin [facility])    Discharge Instructions  Discharge Instructions    Diet - low sodium heart healthy   Complete by: As directed    Discharge instructions   Complete by: As directed    1)Please take prescribed medications as instructed 2)Follow up with vascular surgery in 4 weeks.  Name and number of the provider  has been attached.   Discharge wound care:   Complete by: As directed    Continue daily dressing changes   Increase activity slowly   Complete by: As directed      Allergies as of 03/18/2020      Reactions   Iohexol Hives   Patient broke out in hives after injection of Omni 300, will need 13 hour pre-med in future   Lisinopril Cough   Simvastatin Itching, Nausea Only      Medication List    STOP taking these medications   potassium chloride SA 20 MEQ tablet Commonly known as: KLOR-CON     TAKE these medications   aspirin 81 MG tablet Take 81 mg by mouth daily.   atorvastatin 80 MG tablet Commonly known as: LIPITOR Take 80 mg by mouth daily.   buPROPion 150 MG 12 hr tablet Commonly known as: WELLBUTRIN SR Take 150 mg by mouth in the morning and at bedtime.   carvedilol 20 MG 24 hr capsule Commonly known as: COREG CR Take 20 mg by mouth daily.   chlorproMAZINE 25 MG  tablet Commonly known as: THORAZINE Take 1 tablet (25 mg total) by mouth 3 (three) times daily as needed for hiccoughs.   folic acid 1 MG tablet Commonly known as: FOLVITE Take 1 tablet (1 mg total) by mouth daily.   gabapentin 300 MG capsule Commonly known as: NEURONTIN Take 300 mg by mouth in the morning and at bedtime.   insulin aspart 100 UNIT/ML  FlexPen Commonly known as: NOVOLOG Inject 4 Units into the skin 3 (three) times daily with meals.   insulin glargine 100 UNIT/ML Solostar Pen Commonly known as: LANTUS Inject 16 Units into the skin daily.   Insulin Pen Needle 30G X 8 MM Misc Commonly known as: NOVOFINE Inject 10 each into the skin as needed.   lipase/protease/amylase 85462 UNITS Cpep capsule Commonly known as: CREON Take 1 capsule (36,000 Units total) by mouth 3 (three) times daily before meals.   losartan 100 MG tablet Commonly known as: COZAAR Take 1 tablet (100 mg total) by mouth daily. What changed:   medication strength  how much to take  Another medication with the same name was removed. Continue taking this medication, and follow the directions you see here.   magnesium oxide 400 (241.3 Mg) MG tablet Commonly known as: MAG-OX Take 1 tablet (400 mg total) by mouth 2 (two) times daily.   oxyCODONE-acetaminophen 5-325 MG tablet Commonly known as: PERCOCET/ROXICET Take 1-2 tablets by mouth every 4 (four) hours as needed for severe pain.   pantoprazole 40 MG tablet Commonly known as: PROTONIX Take 40 mg by mouth daily. What changed: Another medication with the same name was removed. Continue taking this medication, and follow the directions you see here.   polyethylene glycol 17 g packet Commonly known as: MIRALAX / GLYCOLAX Take 17 g by mouth daily as needed for mild constipation.   Quintabs Tabs Take 1 tablet by mouth daily.   thiamine 100 MG tablet Take 1 tablet (100 mg total) by mouth daily.   zolpidem 10 MG tablet Commonly known as: AMBIEN Take 10 mg by mouth at bedtime as needed for sleep.            Discharge Care Instructions  (From admission, onward)         Start     Ordered   03/18/20 0000  Discharge wound care:       Comments: Continue daily dressing changes   03/18/20 1103          Follow-up Information    Chuck Hint, MD Follow up in 4 week(s).    Specialties: Vascular Surgery, Cardiology Why: The office will contact patient with follow up appointment Contact information: [address] [place] [address] [phone]              Allergies  Allergen Reactions  . Iohexol Hives    Patient broke out in hives after injection of Omni 300, will need 13 hour pre-med in future  . Lisinopril Cough  . Simvastatin Itching and Nausea Only    Consultations:  vascular surgery   Procedures/Studies: DG Foot Complete Right  Result Date: 03/12/2020 CLINICAL DATA:  Diabetes.  Second toe necrosis. EXAM: RIGHT FOOT COMPLETE - 3+ VIEW COMPARISON:  None. FINDINGS: Extensive air is identified throughout the soft tissues of the entire right foot. Extensive air limits evaluation of underlying bony structures. There is deformity of the distal second toe. IMPRESSION: Extensive air is identified throughout the soft tissues of the entire right foot, suspicious for infection. Extensive air limits  evaluation of underlying bony structures. There is deformity of the distal second toe. Electronically Signed   By: Sherian Rein M.D.   On: 03/12/2020 10:26   VAS [place] ABI WITH/WO TBI  Result Date: 03/12/2020 LOWER EXTREMITY DOPPLER STUDY Indications: Rest pain, gangrene, and peripheral artery disease. High Risk Factors: Hypertension, hyperlipidemia, Diabetes, current smoker.  Vascular Interventions: Right leg angio and stent placed 02/25/19. Comparison Study: No previous exams Performing Technologist: Clint Guy RVT  Examination Guidelines: A complete evaluation includes at minimum, Doppler waveform signals and systolic blood pressure reading at the level of bilateral brachial, anterior tibial, and posterior tibial arteries, when vessel segments are accessible. Bilateral testing is considered an integral part of a complete examination. Photoelectric Plethysmograph (PPG) waveforms and toe systolic pressure readings are included as required and additional duplex  testing as needed. Limited examinations for reoccurring indications may be performed as noted.  ABI Findings: +--------+------------------+-----+-------------------+--------+ Right   Rt Pressure (mmHg)IndexWaveform           Comment  +--------+------------------+-----+-------------------+--------+ [id number]                    triphasic                   +--------+------------------+-----+-------------------+--------+ PTA     74                0.59 dampened monophasic         +--------+------------------+-----+-------------------+--------+ DP      78                0.62 dampened monophasic         +--------+------------------+-----+-------------------+--------+ +--------+------------------+-----+----------+-------+ Left    Lt Pressure (mmHg)IndexWaveform  Comment +--------+------------------+-----+----------+-------+ [id number]                    triphasic         +--------+------------------+-----+----------+-------+ PTA     74                0.59 monophasic        +--------+------------------+-----+----------+-------+ DP      79                0.63 monophasic        +--------+------------------+-----+----------+-------+ +-------+-----------+-----------+------------+------------+ ABI/TBIToday's ABIToday's TBIPrevious ABIPrevious TBI +-------+-----------+-----------+------------+------------+ Right  0.62                                           +-------+-----------+-----------+------------+------------+ Left   0.63                                           +-------+-----------+-----------+------------+------------+  Summary: Right: Resting right ankle-brachial index indicates moderate right lower extremity arterial disease. Left: Resting left ankle-brachial index indicates moderate left lower extremity arterial disease.  *See table(s) above for measurements and observations.  Electronically signed by Waverly Ferrari MD on 03/12/2020 at 3:31:47  PM.    Final        Subjective:  Patient seen and examined at the bedside this morning.  Hemodynamically  stable for discharge. Discharge Exam: Vitals:   03/18/20 0543 03/18/20 1034  BP: (!) 146/90 (!) 154/91  Pulse:  88  Resp:    Temp:    SpO2:     Vitals:   03/17/20 2356  03/18/20 0512 03/18/20 0543 03/18/20 1034  BP: 124/79 (!) 156/101 (!) 146/90 (!) 154/91  Pulse: 92 89  88  Resp: 18 18    Temp: 98.5 F (36.9 C) 98 F (36.7 C)    TempSrc: Oral Oral    SpO2: 95% 95%    Weight:      Height:        General: Pt is alert, awake, not in acute distress Cardiovascular: RRR, S1/S2 +, no rubs, no gallops Respiratory: CTA bilaterally, no wheezing, no rhonchi Abdominal: Soft, NT, ND, bowel sounds + Extremities: no edema, no cyanosis,right BKA    The results of significant diagnostics from this hospitalization (including imaging, microbiology, ancillary and laboratory) are listed below for reference.     Microbiology: Recent Results (from the past 240 hour(s))  SARS CORONAVIRUS 2 (TAT 6-24 HRS) Nasopharyngeal Nasopharyngeal Swab     Status: None   Collection Time: 03/12/20  9:35 AM   Specimen: Nasopharyngeal Swab  Result Value Ref Range Status   SARS Coronavirus 2 NEGATIVE NEGATIVE Final    Comment: (NOTE) SARS-CoV-2 target nucleic acids are NOT DETECTED.  The SARS-CoV-2 RNA is generally detectable in upper and lower respiratory specimens during the acute phase of infection. Negative results do not preclude SARS-CoV-2 infection, do not rule out co-infections with other pathogens, and should not be used as the sole basis for treatment or other patient management decisions. Negative results must be combined with clinical observations, patient history, and epidemiological information. The expected result is Negative.  Fact Sheet for Patients: HairSlick.nohttps://www.fda.gov/media/138098/download  Fact Sheet for Healthcare  Providers: quierodirigir.comhttps://www.fda.gov/media/138095/download  This test is not yet approved or cleared by the [place] FDA and  has been authorized for detection and/or diagnosis of SARS-CoV-2 by FDA under an Emergency Use Authorization (EUA). This EUA will remain  in effect (meaning this test can be used) for the duration of the COVID-19 declaration under Se ction 564(b)(1) of the Act, 21 U.S.C. section 360bbb-3(b)(1), unless the authorization is terminated or revoked sooner.  Performed at [facility], [address] [place], [place] [address]   Blood culture (routine x 2)     Status: None   Collection Time: 03/12/20  9:37 AM   Specimen: BLOOD  Result Value Ref Range Status   Specimen Description BLOOD LEFT ANTECUBITAL  Final   Special Requests   Final    BOTTLES DRAWN AEROBIC AND ANAEROBIC Blood Culture adequate volume   Culture   Final    NO GROWTH 5 DAYS Performed at [facility], [address] [place], [place] [address]    Report Status 03/17/2020 FINAL  Final  Blood culture (routine x 2)     Status: None   Collection Time: 03/12/20  8:55 PM   Specimen: BLOOD  Result Value Ref Range Status   Specimen Description BLOOD LEFT ANTECUBITAL  Final   Special Requests   Final    BOTTLES DRAWN AEROBIC ONLY Blood Culture adequate volume   Culture   Final    NO GROWTH 5 DAYS Performed at [facility], [address] [place], [place] [address]    Report Status 03/17/2020 FINAL  Final  Surgical pcr screen     Status: None   Collection Time: 03/12/20  9:04 PM   Specimen: Nasal Mucosa; Nasal Swab  Result Value Ref Range Status   MRSA, PCR NEGATIVE NEGATIVE Final   Staphylococcus aureus NEGATIVE NEGATIVE Final    Comment: (NOTE) The Xpert SA Assay (  FDA approved for NASAL specimens in patients 64 years of age and older), is one component of a comprehensive surveillance program. It is not intended to diagnose infection nor to guide or monitor  treatment. Performed at [facility], [address], [place], [place] [address]   Aerobic/Anaerobic Culture (surgical/deep wound)     Status: None (Preliminary result)   Collection Time: 03/13/20  8:07 AM   Specimen: Wound  Result Value Ref Range Status   Specimen Description WOUND  Final   Special Requests NONE  Final   Gram Stain   Final    NO WBC SEEN ABUNDANT GRAM NEGATIVE RODS MODERATE GRAM POSITIVE COCCI    Culture   Final    RARE ENTEROCOCCUS AVIUM FEW BACTEROIDES THETAIOTAOMICRON FEW BACTEROIDES FRAGILIS BETA LACTAMASE NEGATIVE Performed at [facility], [address] [place] [address] [place], [place] [address]    Report Status PENDING  Incomplete   Organism ID, Bacteria ENTEROCOCCUS AVIUM  Final      Susceptibility   Enterococcus avium - MIC*    AMPICILLIN <=2 SENSITIVE Sensitive     VANCOMYCIN <=0.5 SENSITIVE Sensitive     GENTAMICIN SYNERGY SENSITIVE Sensitive     * RARE ENTEROCOCCUS AVIUM     Labs: BNP (last 3 results) No results for input(s): BNP in the last 8760 hours. Basic Metabolic Panel: Recent Labs  Lab 03/12/20 0956 03/13/20 0310 03/14/20 0846 03/15/20 0248 03/16/20 0220  NA 135 136 135 136 139  K 4.7 4.1 3.3* 3.9 4.2  CL 100 104 104 106 109  CO2 22 21* 20* 18* 21*  GLUCOSE 433* 267* 211* 187* 279*  BUN 11 10 6 6 7   CREATININE 0.74 0.67 0.68 0.56* 0.69  CALCIUM 9.2 8.2* 7.6* 7.6* 7.5*  MG  --   --  1.6*  --   --    Liver Function Tests: No results for input(s): AST, ALT, ALKPHOS, BILITOT, PROT, ALBUMIN in the last 168 hours. No results for input(s): LIPASE, AMYLASE in the last 168 hours. No results for input(s): AMMONIA in the last 168 hours. CBC: Recent Labs  Lab 03/12/20 0956 03/13/20 0310 03/14/20 0846 03/15/20 0248 03/16/20 0220 03/17/20 0142  WBC 20.1* 19.6* 18.2* 16.8* 11.7* 12.9*  NEUTROABS 17.4*  --  14.7* 12.6* 8.9* 10.2*  HGB 12.6* 11.9* 11.6* 12.4* 12.4* 12.9*  HCT 38.8* 34.9* 35.2* 38.1* 36.9* 40.4  MCV 93.9 90.6 92.9  92.7 92.0 93.5  PLT 287 325 342 351 366 416*   Cardiac Enzymes: No results for input(s): CKTOTAL, CKMB, CKMBINDEX, TROPONINI in the last 168 hours. BNP: Invalid input(s): POCBNP CBG: Recent Labs  Lab 03/17/20 0601 03/17/20 1005 03/17/20 1541 03/17/20 2123 03/18/20 0557  GLUCAP 121* 103* 86 77 84   D-Dimer No results for input(s): DDIMER in the last 72 hours. Hgb A1c No results for input(s): HGBA1C in the last 72 hours. Lipid Profile No results for input(s): CHOL, HDL, LDLCALC, TRIG, CHOLHDL, LDLDIRECT in the last 72 hours. Thyroid function studies No results for input(s): TSH, T4TOTAL, T3FREE, THYROIDAB in the last 72 hours.  Invalid input(s): FREET3 Anemia work up No results for input(s): VITAMINB12, FOLATE, FERRITIN, TIBC, IRON, RETICCTPCT in the last 72 hours. Urinalysis    Component Value Date/Time   COLORURINE YELLOW 10/07/2019 1735   APPEARANCEUR CLEAR 10/07/2019 1735   LABSPEC 1.029 10/07/2019 1735   PHURINE 5.0 10/07/2019 1735   GLUCOSEU >=500 (A) 10/07/2019 1735   HGBUR SMALL (A) 10/07/2019 1735   BILIRUBINUR NEGATIVE 10/07/2019 1735  KETONESUR 80 (A) 10/07/2019 1735   PROTEINUR 30 (A) 10/07/2019 1735   NITRITE NEGATIVE 10/07/2019 1735   LEUKOCYTESUR NEGATIVE 10/07/2019 1735   Sepsis Labs Invalid input(s): PROCALCITONIN,  WBC,  LACTICIDVEN Microbiology Recent Results (from the past 240 hour(s))  SARS CORONAVIRUS 2 (TAT 6-24 HRS) Nasopharyngeal Nasopharyngeal Swab     Status: None   Collection Time: 03/12/20  9:35 AM   Specimen: Nasopharyngeal Swab  Result Value Ref Range Status   SARS Coronavirus 2 NEGATIVE NEGATIVE Final    Comment: (NOTE) SARS-CoV-2 target nucleic acids are NOT DETECTED.  The SARS-CoV-2 RNA is generally detectable in upper and lower respiratory specimens during the acute phase of infection. Negative results do not preclude SARS-CoV-2 infection, do not rule out co-infections with other pathogens, and should not be used as  the sole basis for treatment or other patient management decisions. Negative results must be combined with clinical observations, patient history, and epidemiological information. The expected result is Negative.  Fact Sheet for Patients: HairSlick.no  Fact Sheet for Healthcare Providers: quierodirigir.com  This test is not yet approved or cleared by the [place] FDA and  has been authorized for detection and/or diagnosis of SARS-CoV-2 by FDA under an Emergency Use Authorization (EUA). This EUA will remain  in effect (meaning this test can be used) for the duration of the COVID-19 declaration under Se ction 564(b)(1) of the Act, 21 U.S.C. section 360bbb-3(b)(1), unless the authorization is terminated or revoked sooner.  Performed at [facility], [address] [place], [place] [address]   Blood culture (routine x 2)     Status: None   Collection Time: 03/12/20  9:37 AM   Specimen: BLOOD  Result Value Ref Range Status   Specimen Description BLOOD LEFT ANTECUBITAL  Final   Special Requests   Final    BOTTLES DRAWN AEROBIC AND ANAEROBIC Blood Culture adequate volume   Culture   Final    NO GROWTH 5 DAYS Performed at [facility], [address] [place], [place] [address]    Report Status 03/17/2020 FINAL  Final  Blood culture (routine x 2)     Status: None   Collection Time: 03/12/20  8:55 PM   Specimen: BLOOD  Result Value Ref Range Status   Specimen Description BLOOD LEFT ANTECUBITAL  Final   Special Requests   Final    BOTTLES DRAWN AEROBIC ONLY Blood Culture adequate volume   Culture   Final    NO GROWTH 5 DAYS Performed at [facility], [address] [place], [place] [address]    Report Status 03/17/2020 FINAL  Final  Surgical pcr screen     Status: None   Collection Time: 03/12/20  9:04 PM   Specimen: Nasal Mucosa; Nasal Swab  Result Value Ref Range Status   MRSA, PCR NEGATIVE  NEGATIVE Final   Staphylococcus aureus NEGATIVE NEGATIVE Final    Comment: (NOTE) The Xpert SA Assay (FDA approved for NASAL specimens in patients 66 years of age and older), is one component of a comprehensive surveillance program. It is not intended to diagnose infection nor to guide or monitor treatment. Performed at [facility], [address], [place], [place] [address]   Aerobic/Anaerobic Culture (surgical/deep wound)     Status: None (Preliminary result)   Collection Time: 03/13/20  8:07 AM   Specimen: Wound  Result Value Ref Range Status   Specimen Description WOUND  Final   Special Requests NONE  Final  Gram Stain   Final    NO WBC SEEN ABUNDANT GRAM NEGATIVE RODS MODERATE GRAM POSITIVE COCCI    Culture   Final    RARE ENTEROCOCCUS AVIUM FEW BACTEROIDES THETAIOTAOMICRON FEW BACTEROIDES FRAGILIS BETA LACTAMASE NEGATIVE Performed at [facility], [address], [place], [place] [address]    Report Status PENDING  Incomplete   Organism ID, Bacteria ENTEROCOCCUS AVIUM  Final      Susceptibility   Enterococcus avium - MIC*    AMPICILLIN <=2 SENSITIVE Sensitive     VANCOMYCIN <=0.5 SENSITIVE Sensitive     GENTAMICIN SYNERGY SENSITIVE Sensitive     * RARE ENTEROCOCCUS AVIUM    Please note: You were cared for by a hospitalist during your hospital stay. Once you are discharged, your primary care physician will handle any further medical issues. Please note that NO REFILLS for any discharge medications will be authorized once you are discharged, as it is imperative that you return to your primary care physician (or establish a relationship with a primary care physician if you do not have one) for your post hospital discharge needs so that they can reassess your need for medications and monitor your lab values.    Time coordinating discharge: 40 minutes  SIGNED:   Burnadette Pop, MD  [facility] 03/18/2020, 11:03 AM Pager [phone]  If 7PM-7AM,  please contact night-coverage [email or web address] Password [email or web address]

## 2020-03-19 LAB — GLUCOSE, CAPILLARY: Glucose-Capillary: 217 mg/dL — ABNORMAL HIGH (ref 70–99)

## 2020-03-19 NOTE — Progress Notes (Signed)
Report called to [facility] [phone] to [place]. Transport here at this time for patient discharge. Patient belongings collected per patient and transported with him to rehab.

## 2020-03-19 NOTE — Progress Notes (Signed)
   ASSESSMENT & PLAN:  Ricardo Howard is a 60 y.o. 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 PRN pain control PT / OT / OOB Leave amputation site open to air.  Can replace dressing as needed for comfort or drainage.  Cover with Kerlix, wrapped with gentle Ace wrap. Disposition to SNF or inpatient rehab.  SUBJECTIVE:  No complaints.  Pain well controlled  OBJECTIVE:  BP (!) 148/88 (BP Location: Left Arm)   Pulse 90   Temp 98.1 F (36.7 C) (Oral)   Resp 18   Ht 5\' 8"  (1.727 m)   Wt 87.1 kg   SpO2 100%   BMI 29.20 kg/m   Intake/Output Summary (Last 24 hours) at 03/19/2020 1002 Last data filed at 03/19/2020 0636 Gross per 24 hour  Intake 360 ml  Output 500 ml  Net -140 ml    Right below-knee amputation healing well  CBC Latest Ref Rng & Units 03/17/2020 03/16/2020 03/15/2020  WBC 4.0 - 10.5 K/uL 12.9(H) 11.7(H) 16.8(H)  Hemoglobin 13.0 - 17.0 g/dL 12.9(L) 12.4(L) 12.4(L)  Hematocrit 39.0 - 52.0 % 40.4 36.9(L) 38.1(L)  Platelets 150 - 400 K/uL 416(H) 366 351     CMP Latest Ref Rng & Units 03/16/2020 03/15/2020 03/14/2020  Glucose 70 - 99 mg/dL 03/16/2020) 409(W) 119(J)  BUN 6 - 20 mg/dL 7 6 6   Creatinine 0.61 - 1.24 mg/dL 478(G ) 9.56  Sodium 135 - 145 mmol/L 139 136 135  Potassium 3.5 - 5.1 mmol/L 4.2 3.9 3.3(L)  Chloride 98 - 111 mmol/L 109 106 104  CO2 22 - 32 mmol/L 21(L) 18(L) 20(L)  Calcium 8.9 - 10.3 mg/dL 7.5(L) 7.6(L) 7.6(L)  Total Protein 6.5 - 8.1 g/dL - - -  Total Bilirubin 0.3 - 1.2 mg/dL - - -  Alkaline Phos 38 - 126 U/L - - -  AST 15 - 41 U/L - - -  ALT 0 - 44 U/L - - -    Estimated Creatinine Clearance: 105.4 mL/min (by C-G formula based on SCr of 0.69 mg/dL).  2.13(Y. 8.65, MD [facility] Phone Number: [phone] 03/19/2020 10:02 AM

## 2020-03-19 NOTE — Progress Notes (Signed)
Patient seen and examined at the bedside this morning.  Hemodynamically stable.  No new changes from yesterday. Discharge orders and summary are already on place.  No change in the medical management

## 2020-03-19 NOTE — TOC Transition Note (Addendum)
Transition of Care [facility]) - CM/SW Discharge Note   Patient Details  Name: Ricardo Howard MRN: [id number] Date of Birth: [birth year]  Transition of Care [facility]) CM/SW Contact:  Lawerance Sabal, RN Phone Number: 03/19/2020, 8:54 AM   Clinical Narrative:   Pedro Earls MD and bedside RN to confirm patient ready for DC this AM. Spoke w Jace, [facility] liaison, who confirms auth and bed availability. Spoke w Thayer Ohm, unit clerk at [employer] who confirms they are ready for patient, verified address.  PTAR called for 10am pickup, patient and those mentioned above aware. Patient states he updated wife.  [facility]AR forms handed to bedside RN.  RN to call and give report, number in yesterday's TOC note.     Final next level of care: IP Rehab Facility Barriers to Discharge: No Barriers Identified   Patient Goals and CMS Choice        Discharge Placement                       Discharge Plan and Services                                     Social Determinants of Health (SDOH) Interventions     Readmission Risk Interventions No flowsheet data found.

## 2020-03-19 NOTE — Discharge Instructions (Signed)
Gangrene °Gangrene is a medical condition that is caused by a lack of blood supply to an area of your body. Oxygen travels through blood, so less blood means less oxygen. Without oxygen, body tissue will start to die. °Gangrene can affect many parts of the body. It is most common on the skin (external gangrene) and on your arms and legs, but it can also affect internal body parts (internal gangrene). Gangrene requires emergency medical treatment. °What are the causes? °Any condition that cuts off blood supply can cause gangrene. Common causes include: °· Injuries. °· Blood vessel diseases. °· Diabetes. °· Infections. °What increases the risk? °You may be at increased risk for gangrene if you have recently had surgery, or if you have: °· A serious injury. °· Diabetes. °· A weak disease-fighting system (immune system). °· Narrowing of your arteries due to plaque buildup (arteriosclerosis). °· An immune system disease that causes your arteries to tighten (Raynaud's disease). °· A history of IV drug use. °What are the signs or symptoms? °Symptoms depend on which part of your body is affected. If you have external gangrene, you may have: °· Severe pain, followed by loss of feeling. °· Redness and swelling. °· Crackling sounds when you press on a swollen area of skin. °· A wound with bad-smelling drainage. °· Changes in the color of your skin. It may turn red, blue, black, or white. °· Fever and chills. °If you have internal gangrene, you may have: °· Fever and chills. °· Confusion. °· Dizziness. °· Severe pain. °· Rapid heartbeat and breathing. °· Loss of appetite. °· Nausea or vomiting.   °How is this diagnosed? °This condition may be diagnosed based on: °· Your symptoms and medical history. °· A physical exam. °· X-rays of your blood vessels after injecting dye into your blood vessels (arteriogram). °· Blood tests to check for infection. °· Imaging tests such as a CT scan or MRI. °· Testing a sample of wound drainage  for bacteria (culture). °· Testing a tissue sample for cell death (biopsy). °· Surgery to look for gangrene inside your body. °How is this treated? °Treatment for gangrene depends on the cause and the area of your body that is affected. Gangrene is usually treated in the hospital. It is very important to start treatment early, before gangrene spreads. Treatment may include: °· High doses of IV antibiotic medicine, for gangrene caused by infection. °· Surgery. Surgical options may include: °? Debridement. This is surgery to remove dead tissue. You may need to have debridement several times. °? Bypass or angioplasty. These surgeries improve blood flow to an affected area. °? Amputation. This is surgery to remove a body part. This may be necessary in very severe cases. °· Oxygen therapy. This involves treatment in a chamber designed to provide high levels of oxygen (hyperbaric oxygen therapy). It can improve the oxygen supply to an affected area. °· Supportive care to keep the rest of your body healthy. This may include: °? IV fluids and nutrients. °? Pain medicines. °? Blood thinners to prevent blood clots. °Follow these instructions at home: °Skin and wound care °· Clean any cuts or scratches with a germ-killing (antiseptic) solution. Your health care provider may recommend certain over-the-counter antiseptics. °· Cover any open wounds with a clean bandage. Change the bandage as often as needed to keep the wound clean. Make sure to wash your hands before touching your bandage. °· Check wounds every day for signs of infection, such as: °? Redness, swelling, or pain. °?   Fluid or blood. °? Warmth. °? Pus or a bad smell. °· If you have diabetes or another blood vessel disease, check your feet every day for signs of injury or infection. You may be at higher risk for gangrene. °Lifestyle °· Do not use any products that contain nicotine or tobacco, such as cigarettes and e-cigarettes. These can delay wound healing. If you  need help quitting, ask your health care provider. °· Limit alcohol intake to no more than 1 drink a day for women (no drinks if you are pregnant) and 2 drinks a day for men. One drink equals 12 oz of beer, 5 oz of wine, or 1½ oz of hard liquor. °· Eat a healthy diet and maintain a healthy weight. °· Get some exercise on most days of the week. Ask your health care provider what forms of exercise may be best for you. °General instructions °· Keep all follow-up visits as told by your health care provider. This is important. °Medicines °· Take over-the-counter and prescription medicines only as told by your health care provider. °· If you were prescribed an antibiotic medicine, take it as told by your health care provider. Do not stop taking the antibiotic even if you start to feel better. °· If you are taking blood thinners: °? Talk with your health care provider before you take any medicines that contain aspirin or NSAIDs. These medicines increase your risk for dangerous bleeding. °? Take your medicine exactly as told, at the same time every day. °? Avoid activities that could cause injury or bruising, and follow instructions about how to prevent falls. °? Wear a medical alert bracelet or carry a card that lists what medicines you take. °Contact a health care provider if: °· You have a wound or sore that is not healing. °Get help right away if: °· You have a wound or sore that shows signs of infection. °· An area of your skin turns white, red, blue, or black. °· You have rapidly worsening pain at the site of a skin infection or wound. °· You experience numbness at the site of a skin infection or wound. °· You have unexplained: °? Fever. °? Chills. °? Confusion. °? Fainting. °Summary °· Gangrene is a medical condition that is caused by a lack of blood supply to an area of your body. °· It is most common on the skin (external gangrene), but it can also affect internal body parts (internal gangrene). °· Injuries and  blood vessel diseases are common causes of gangrene. °· Gangrene requires emergency medical treatment. Treatment may include hospitalization, antibiotics, or surgery. °This information is not intended to replace advice given to you by your health care provider. Make sure you discuss any questions you have with your health care provider. °Document Revised: 12/03/2019 Document Reviewed: 12/03/2019 °Elsevier Patient Education © 2021 [employer] ° °

## 2020-03-29 ENCOUNTER — Telehealth: Payer: Self-pay

## 2020-03-29 NOTE — Telephone Encounter (Signed)
Received call from [facility]. 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 Reported he is "bleeding a lot, it soaked through his pants and is all over the floor." Advised them that was an ED visit. Verbalized understanding.

## 2020-03-30 ENCOUNTER — Telehealth: Payer: Self-pay

## 2020-03-30 NOTE — Telephone Encounter (Signed)
Patient's wife called to follow up after patient's fall yesterday. He is having pain, and a small spot on the incision is still bleeding a little. No s/s of infection. Advised her to hold direct pressure on the stump for 10 minutes and she could do this up to 2 times. She knows to call us back if bleeding continues or patient develops s/s of infection.

## 2020-04-14 ENCOUNTER — Telehealth: Payer: Self-pay

## 2020-04-14 ENCOUNTER — Other Ambulatory Visit: Payer: Self-pay | Admitting: Vascular Surgery

## 2020-04-14 MED ORDER — OXYCODONE-ACETAMINOPHEN 5-325 MG PO TABS: 1.0000 | ORAL_TABLET | ORAL | 0 refills | Status: DC | PRN

## 2020-04-14 NOTE — Telephone Encounter (Signed)
Pt's HH nurse call Alcario Drought called to let us know pt is c/o 10/10 pain with moderate bloody drainage coming from R BKA. Per MD, pt has been scheduled to see APP in office tomorrow morning and MD printed a rx for pain medication. Pt and HH nurse are aware of the above and verbalized understanding.

## 2020-04-14 NOTE — Telephone Encounter (Signed)
[facility] PT assistant called to let us know pt had a fall last night on R AKA. PT assistant has put in request for [facility] nurse to return to see pt. I called pt and it sounds like bleeding has tapered off and he did not feel the need to go to the ED last night. He is having pain and is going to try XS Tylenol and will call us back if this does not help pain. Pt does have a f/u appt next week that I have made him aware of. He is aware to keep area clean and dry and will call us back if he needs further assistance.

## 2020-04-15 ENCOUNTER — Other Ambulatory Visit: Payer: Self-pay

## 2020-04-15 ENCOUNTER — Encounter: Payer: Self-pay | Admitting: Physician Assistant

## 2020-04-15 ENCOUNTER — Other Ambulatory Visit: Payer: Self-pay | Admitting: Physician Assistant

## 2020-04-15 ENCOUNTER — Ambulatory Visit (INDEPENDENT_AMBULATORY_CARE_PROVIDER_SITE_OTHER): Payer: No Typology Code available for payment source | Admitting: Physician Assistant

## 2020-04-15 VITALS — BP 138/92 | HR 103 | Temp 98.8°F | Resp 20 | Ht 68.0 in | Wt 192.0 lb

## 2020-04-15 DIAGNOSIS — I739 Peripheral vascular disease, unspecified: Secondary | ICD-10-CM

## 2020-04-15 DIAGNOSIS — Z89511 Acquired absence of right leg below knee: Secondary | ICD-10-CM

## 2020-04-15 MED ORDER — OXYCODONE-ACETAMINOPHEN 5-325 MG PO TABS: 1.0000 | ORAL_TABLET | ORAL | 0 refills | Status: DC | PRN

## 2020-04-15 NOTE — Progress Notes (Signed)
POST OPERATIVE OFFICE NOTE    CC:  F/u for surgery  HPI:  This is a 60 y.o. male who is s/p right below knee amputation on 03/15/20 by Dr. Edilia Bo. This was performed due to progressing gangrene of right foot which initially was treated by TMA on 03/13/20 but subsequently the TMA was not healing and required a BKA  He reports he has fallen 5 x now onto right BKA. Most recent fall was on 04/13/20. He had another prior to this on 03/29/20. He reports bleeding and increased pain since most recent falls. Pain is 10/10 with significant tenderness to lateral side of BKA. Bleeding is sort of a minimal oozing. He otherwise aside from falls says he had been doing very well with PT. He has appointment next week to get shrinker as well with Prosthetics  Allergies  Allergen Reactions  . Iohexol Hives    Patient broke out in hives after injection of Omni 300, will need 13 hour pre-med in future  . Lac Bovis Diarrhea    Other reaction(s): Finding of gastrointestinal tract gas, Diarrhea  . Lisinopril Cough  . Simvastatin Itching and Nausea Only    Current Outpatient Medications  Medication Sig Dispense Refill  . amLODipine (NORVASC) 10 MG tablet TAKE ONE TABLET BY MOUTH DAILY FOR BLOOD PRESSURE.    Marland Kitchen aspirin 81 MG tablet Take 81 mg by mouth daily.    Marland Kitchen atorvastatin (LIPITOR) 80 MG tablet Take 80 mg by mouth daily.    Marland Kitchen buPROPion (WELLBUTRIN SR) 150 MG 12 hr tablet Take 150 mg by mouth in the morning and at bedtime.    . carvedilol (COREG CR) 20 MG 24 hr capsule Take 20 mg by mouth daily.    . carvedilol (COREG) 25 MG tablet TAKE ONE TABLET BY MOUTH TWICE A DAY FOR BLOOD PRESSURE/HEART    . chlorproMAZINE (THORAZINE) 25 MG tablet Take 1 tablet (25 mg total) by mouth 3 (three) times daily as needed for hiccoughs.    . cyclobenzaprine (FLEXERIL) 10 MG tablet  10 mg = 1 tab, Tab, Oral, TID, 30 tab, 0 Refill(s), Route to Pharmacy Electronically, [facility] [phone], 173, 03/27/20 16:52:00 EST,  Height/Length Dosing, cm, 83, 03/27/20 16:52:00 EST, Weight Dosing, kg    . Docusate Sodium (DSS) 100 MG CAPS  100 mg = 1 cap, Cap, Oral, BID, 0 Refill(s)    . folic acid (FOLVITE) 1 MG tablet Take 1 tablet (1 mg total) by mouth daily.    . furosemide (LASIX) 20 MG tablet Take 1 tablet by mouth daily.    Marland Kitchen gabapentin (NEURONTIN) 300 MG capsule Take 300 mg by mouth in the morning and at bedtime.    Marland Kitchen HYDROcodone-acetaminophen (NORCO/VICODIN) 5-325 MG tablet Take 1 tablet by mouth every 4 (four) hours as needed.    . hydrOXYzine (VISTARIL) 25 MG capsule TAKE ONE CAPSULE BY MOUTH AT BEDTIME AS NEEDED FOR INSOMNIA    . insulin aspart (NOVOLOG) 100 UNIT/ML FlexPen Inject 4 Units into the skin 3 (three) times daily with meals. 15 mL 0  . insulin glargine (LANTUS) 100 UNIT/ML Solostar Pen Inject 16 Units into the skin daily. 15 mL 0  . Insulin Pen Needle (NOVOFINE) 30G X 8 MM MISC Inject 10 each into the skin as needed. 100 each 0  . isosorbide mononitrate (IMDUR) 30 MG 24 hr tablet Take 1 tablet by mouth daily.    . lipase/protease/amylase (CREON) 36000 UNITS CPEP capsule Take 1 capsule (36,000 Units total) by mouth  3 (three) times daily before meals. 270 capsule 0  . losartan (COZAAR) 100 MG tablet Take 1 tablet (100 mg total) by mouth daily.    . magnesium oxide (MAG-OX) 400 (241.3 Mg) MG tablet Take 1 tablet (400 mg total) by mouth 2 (two) times daily.    . magnesium oxide (MAG-OX) 400 MG tablet  400 mg = 1 tab, Tab, Oral, BID, 20 tab, 0 Refill(s), Route to Pharmacy Electronically, [facility] [phone], 173, 03/27/20 16:52:00 EST, Height/Length Dosing, cm, 83, 03/27/20 16:52:00 EST, Weight Dosing, kg    . metFORMIN (GLUCOPHAGE) 500 MG tablet Take by mouth.    . Multiple Vitamin (QUINTABS) TABS Take 1 tablet by mouth daily.    . nicotine (NICODERM CQ - DOSED IN MG/24 HOURS) 14 mg/24hr patch APPLY 1 PATCH TO SKIN ONCE A DAY *APPLY TO NON-HAIRY, CLEAN, DRY AREA (NO TOBACCO PRODUCTS)*    .  nicotine (NICODERM CQ - DOSED IN MG/24 HR) 7 mg/24hr patch APPLY 1 PATCH TO SKIN ONCE A DAY *APPLY TO NON-HAIRY, CLEAN, DRY AREA (NO TOBACCO PRODUCTS)*    . oxyCODONE-acetaminophen (PERCOCET) 5-325 MG tablet Take 1-2 tablets by mouth every 4 (four) hours as needed for severe pain. 20 tablet 0  . oxyCODONE-acetaminophen (PERCOCET) 5-325 MG tablet Take 1-2 tablets by mouth every 4 (four) hours as needed for severe pain. 20 tablet 0  . oxyCODONE-acetaminophen (PERCOCET/ROXICET) 5-325 MG tablet Take 1-2 tablets by mouth every 4 (four) hours as needed for severe pain. 15 tablet 0  . oxyCODONE-acetaminophen (PERCOCET/ROXICET) 5-325 MG tablet Take 1-2 tablets by mouth every 4 (four) hours as needed. 20 tablet 0  . pantoprazole (PROTONIX) 40 MG tablet Take 40 mg by mouth daily.    . polyethylene glycol (MIRALAX / GLYCOLAX) 17 g packet Take 17 g by mouth daily as needed for mild constipation. 14 each 0  . sertraline (ZOLOFT) 50 MG tablet TAKE TWO TABLETS BY MOUTH IN THE MORNING FOR DEPRESSION/ANXIETY    . tamsulosin (FLOMAX) 0.4 MG CAPS capsule TAKE ONE CAPSULE BY MOUTH DAILY FOR PROSTATE    . thiamine 100 MG tablet Take 1 tablet (100 mg total) by mouth daily.    . vitamin B-12 (CYANOCOBALAMIN) 500 MCG tablet TAKE ONE TABLET BY MOUTH DAILY TO SUPPLEMENT B12    . zolpidem (AMBIEN) 10 MG tablet Take 10 mg by mouth at bedtime as needed for sleep.     No current facility-administered medications for this visit.     ROS:  See HPI  Physical Exam:  Vitals:   04/15/20 0834  BP: (!) 138/92  Pulse: (!) 103  Resp: 20  Temp: 98.8 F (37.1 C)  TempSrc: Temporal  SpO2: 96%  Weight: 192 lb (87.1 kg)  Height: 5\' 8"  (1.727 m)   General: not in any acute distress Incision:  Right BKA stump staples in tact. Mildly oozing blood from staple line centrally and laterally in one area. No increased bleeding on compression. Appears there is small lateral hematoma which is tender to palpation. No signs of infection.  Flaps appear viable Extremities: well perfused and warm Neuro: alert and oriented  Assessment/Plan:  This is a 60 y.o. male who is s/p right below knee amputation on 03/15/20 by Dr. 03/17/20. He has had 5 falls onto right BKA stump most recently on 04/13/20. Has had oozing bleeding from staple line and increased pain. He has small hematoma present but staples are intact and flaps appear viable. I discussed options for close monitoring and pain control  vs. Taking him back to OR to wash it out. They would like to wait and see how he does which I feel is reasonable. He has appointment scheduled for 04/20/20 which was his initial post op appointment. I will have him keep this incase he is still having significant pain or it becomes unbearable. If he is feeling okay he can cancel this appointment next week. I have scheduled him for a separate appointment in 2 weeks and hopefully if he does not have any more trauma to the right BKA he can get his staples out.  - Recommend wrapping with kerlix and ace wrap  - Hold off on getting shrinker until follow up  - pain medication was sent to patients pharmacy. Advised him to alternate with tylenol as needed - Try to keep right BKA stump as protected as possible - Will call if any further bleeding issues or falls   Graceann Congress, PA-C [facility] [phone]  Clinic MD:  Dr. Randie Heinz

## 2020-04-20 ENCOUNTER — Encounter: Payer: Self-pay | Admitting: Physician Assistant

## 2020-04-20 ENCOUNTER — Ambulatory Visit (INDEPENDENT_AMBULATORY_CARE_PROVIDER_SITE_OTHER): Payer: No Typology Code available for payment source | Admitting: Physician Assistant

## 2020-04-20 ENCOUNTER — Other Ambulatory Visit: Payer: Self-pay

## 2020-04-20 VITALS — BP 77/58 | HR 104 | Temp 98.4°F | Resp 20 | Ht 68.0 in | Wt 192.0 lb

## 2020-04-20 DIAGNOSIS — I739 Peripheral vascular disease, unspecified: Secondary | ICD-10-CM

## 2020-04-20 DIAGNOSIS — Z89511 Acquired absence of right leg below knee: Secondary | ICD-10-CM

## 2020-04-20 NOTE — Progress Notes (Signed)
    Postoperative Visit    History of Present Illness   Ricardo Howard is a 60 y.o. male who presents for postoperative follow-up for: right below-the-knee ampuation by Dr. Edilia Bo (Date: 03/15/20).  Patient was seen in the office last week after falling on his BKA.  Patient states the incision remains intact however has noticed some thin blood tinged drainage over the past week.  He denies any fevers, chills, nausea/vomiting.  He has had some with borderline hypotension.  He has a follow-up appointment with his PCP tomorrow.   For VQI Use Only   PRE-ADM LIVING: Home  AMB STATUS: Wheelchair   Physical Examination  There were no vitals filed for this visit.  RLE: Stump incision is intact with scant serosanguineous drainage with manipulation however no palpable areas of fluctuance or any purulence noted.  There is mild edema of right BKA.  Staples are intact.   Medical Decision Making   Ricardo Howard is a 60 y.o. male who presents s/p right below-the-knee amputation.   Every other staple was removed in office today  Recommended daily cleansing of BKA incision with soap and water, then pat to dry  Continue wrapping with ACE due to ongoing edema  Also recommended holding BP medication until patient is evaluated by his PCP, given systolic BP has been in the low 80s  Remove remainder of staples next week  Emilie Rutter PA-C [facility] [place] Office: [phone]  Clinic MD: Edilia Bo

## 2020-04-29 ENCOUNTER — Encounter: Payer: Self-pay | Admitting: Physician Assistant

## 2020-04-29 ENCOUNTER — Ambulatory Visit (INDEPENDENT_AMBULATORY_CARE_PROVIDER_SITE_OTHER): Payer: No Typology Code available for payment source | Admitting: Physician Assistant

## 2020-04-29 ENCOUNTER — Other Ambulatory Visit: Payer: Self-pay

## 2020-04-29 VITALS — BP 92/65 | HR 97 | Temp 98.1°F | Resp 20 | Ht 68.0 in

## 2020-04-29 DIAGNOSIS — I739 Peripheral vascular disease, unspecified: Secondary | ICD-10-CM

## 2020-04-29 DIAGNOSIS — Z89511 Acquired absence of right leg below knee: Secondary | ICD-10-CM

## 2020-04-29 NOTE — Progress Notes (Signed)
POST OPERATIVE OFFICE NOTE    CC:  F/u for surgery  HPI:  This is a 60 y.o. male who is s/p right BKA on 03/15/20 by Dr. Edilia Bo.    Pt returns today for follow up.  Pt states he is doing well over all, still minimal drainage from the medial incision.  No fever or chills. He has an appt. With [employer] for prothesis.  Allergies  Allergen Reactions  . Iohexol Hives    Patient broke out in hives after injection of Omni 300, will need 13 hour pre-med in future  . Lac Bovis Diarrhea    Other reaction(s): Finding of gastrointestinal tract gas, Diarrhea  . Lisinopril Cough  . Simvastatin Itching and Nausea Only    Current Outpatient Medications  Medication Sig Dispense Refill  . amLODipine (NORVASC) 5 MG tablet TAKE ONE TABLET BY MOUTH DAILY FOR BLOOD PRESSURE.    Marland Kitchen aspirin 81 MG tablet Take 81 mg by mouth daily.    Marland Kitchen atorvastatin (LIPITOR) 80 MG tablet Take 80 mg by mouth daily.    Marland Kitchen buPROPion (WELLBUTRIN SR) 150 MG 12 hr tablet Take 150 mg by mouth in the morning and at bedtime.    . carvedilol (COREG CR) 20 MG 24 hr capsule Take 20 mg by mouth daily.    . carvedilol (COREG) 25 MG tablet TAKE ONE TABLET BY MOUTH TWICE A DAY FOR BLOOD PRESSURE/HEART    . chlorproMAZINE (THORAZINE) 25 MG tablet Take 1 tablet (25 mg total) by mouth 3 (three) times daily as needed for hiccoughs.    . cyclobenzaprine (FLEXERIL) 10 MG tablet  10 mg = 1 tab, Tab, Oral, TID, 30 tab, 0 Refill(s), Route to Pharmacy Electronically, [facility] [phone], 173, 03/27/20 16:52:00 EST, Height/Length Dosing, cm, 83, 03/27/20 16:52:00 EST, Weight Dosing, kg    . Docusate Sodium (DSS) 100 MG CAPS  100 mg = 1 cap, Cap, Oral, BID, 0 Refill(s)    . folic acid (FOLVITE) 1 MG tablet Take 1 tablet (1 mg total) by mouth daily.    . furosemide (LASIX) 20 MG tablet Take 0.5 tablets by mouth daily.    Marland Kitchen gabapentin (NEURONTIN) 300 MG capsule Take 300 mg by mouth in the morning and at bedtime.    Marland Kitchen HYDROcodone-acetaminophen  (NORCO/VICODIN) 5-325 MG tablet Take 1 tablet by mouth every 4 (four) hours as needed.    . hydrOXYzine (VISTARIL) 25 MG capsule TAKE ONE CAPSULE BY MOUTH AT BEDTIME AS NEEDED FOR INSOMNIA    . insulin aspart (NOVOLOG) 100 UNIT/ML FlexPen Inject 4 Units into the skin 3 (three) times daily with meals. 15 mL 0  . insulin glargine (LANTUS) 100 UNIT/ML Solostar Pen Inject 16 Units into the skin daily. 15 mL 0  . Insulin Pen Needle (NOVOFINE) 30G X 8 MM MISC Inject 10 each into the skin as needed. 100 each 0  . isosorbide mononitrate (IMDUR) 30 MG 24 hr tablet Take 1 tablet by mouth daily.    . lipase/protease/amylase (CREON) 36000 UNITS CPEP capsule Take 1 capsule (36,000 Units total) by mouth 3 (three) times daily before meals. 270 capsule 0  . losartan (COZAAR) 50 MG tablet Take 0.5 tablets by mouth daily.    . magnesium oxide (MAG-OX) 400 (241.3 Mg) MG tablet Take 1 tablet (400 mg total) by mouth 2 (two) times daily.    . magnesium oxide (MAG-OX) 400 MG tablet  400 mg = 1 tab, Tab, Oral, BID, 20 tab, 0 Refill(s), Route to Pharmacy Electronically,  [facility] #[id number], 173, 03/27/20 16:52:00 EST, Height/Length Dosing, cm, 83, 03/27/20 16:52:00 EST, Weight Dosing, kg    . metFORMIN (GLUCOPHAGE) 500 MG tablet Take by mouth.    . Multiple Vitamin (QUINTABS) TABS Take 1 tablet by mouth daily.    . nicotine (NICODERM CQ - DOSED IN MG/24 HOURS) 14 mg/24hr patch APPLY 1 PATCH TO SKIN ONCE A DAY *APPLY TO NON-HAIRY, CLEAN, DRY AREA (NO TOBACCO PRODUCTS)*    . nicotine (NICODERM CQ - DOSED IN MG/24 HR) 7 mg/24hr patch APPLY 1 PATCH TO SKIN ONCE A DAY *APPLY TO NON-HAIRY, CLEAN, DRY AREA (NO TOBACCO PRODUCTS)*    . oxyCODONE-acetaminophen (PERCOCET) 5-325 MG tablet Take 1-2 tablets by mouth every 4 (four) hours as needed for severe pain. 20 tablet 0  . oxyCODONE-acetaminophen (PERCOCET) 5-325 MG tablet Take 1-2 tablets by mouth every 4 (four) hours as needed for severe pain. 20 tablet 0  .  oxyCODONE-acetaminophen (PERCOCET/ROXICET) 5-325 MG tablet Take 1-2 tablets by mouth every 4 (four) hours as needed for severe pain. 15 tablet 0  . oxyCODONE-acetaminophen (PERCOCET/ROXICET) 5-325 MG tablet Take 1-2 tablets by mouth every 4 (four) hours as needed. 20 tablet 0  . pantoprazole (PROTONIX) 40 MG tablet Take 40 mg by mouth daily.    . polyethylene glycol (MIRALAX / GLYCOLAX) 17 g packet Take 17 g by mouth daily as needed for mild constipation. 14 each 0  . sertraline (ZOLOFT) 50 MG tablet TAKE TWO TABLETS BY MOUTH IN THE MORNING FOR DEPRESSION/ANXIETY    . tamsulosin (FLOMAX) 0.4 MG CAPS capsule TAKE ONE CAPSULE BY MOUTH DAILY FOR PROSTATE    . thiamine 100 MG tablet Take 1 tablet (100 mg total) by mouth daily.    . vitamin B-12 (CYANOCOBALAMIN) 500 MCG tablet TAKE ONE TABLET BY MOUTH DAILY TO SUPPLEMENT B12    . zolpidem (AMBIEN) 10 MG tablet Take 10 mg by mouth at bedtime as needed for sleep.     No current facility-administered medications for this visit.     ROS:  See HPI  Physical Exam:    Incision:  Healing well with slight superficial separation medial incision.  Stump warm to touch. Extremities:   Staples removed stump appears viable.    Assessment/Plan:  This is a 60 y.o. male who is s/p: Right BKA.  Minimal drainage SS medial stump.  Staples removed.    F/U with [employer] for prothesis planning.  I schedule him for a 2-3 week f/u for incision check.  They will call sooner if there are problems. The pt has a Below Knee Amputation The patient requires a prosthesis to improve their mobility and has the ability to function with the prosthesis to maintain a healthy lifestyle and perform ADLs. The patient is a new amputee and thus he does not have a prosthesis. The patient verbally communicates a strong desire to get a prosthesis. The patient is currently using a walker to hop around and a wheelchair for mobility aids and is not expected to do so with the new prosthesis.   Patient is a functional K2 level or greater ambulator. They hop around their house with their walker, do single leg sit and stands, and is exhibiting strength, motivation, potential and ability to ambulate with a walking aid safely and independently.    Mosetta Pigeon PA-C [facility] [phone]   Clinic MD:  Randie Heinz

## 2020-05-11 ENCOUNTER — Ambulatory Visit: Payer: No Typology Code available for payment source

## 2020-07-17 ENCOUNTER — Inpatient Hospital Stay (HOSPITAL_COMMUNITY)
Admission: EM | Admit: 2020-07-17 | Discharge: 2020-07-21 | DRG: 440 | Disposition: A | Discharge status: Home / Self Care | Payer: No Typology Code available for payment source | Attending: Internal Medicine | Admitting: Internal Medicine

## 2020-07-17 ENCOUNTER — Other Ambulatory Visit: Payer: Self-pay

## 2020-07-17 ENCOUNTER — Encounter (HOSPITAL_COMMUNITY): Payer: Self-pay | Admitting: *Deleted

## 2020-07-17 DIAGNOSIS — Z7984 Long term (current) use of oral hypoglycemic drugs: Secondary | ICD-10-CM

## 2020-07-17 DIAGNOSIS — Z89511 Acquired absence of right leg below knee: Secondary | ICD-10-CM

## 2020-07-17 DIAGNOSIS — R739 Hyperglycemia, unspecified: Secondary | ICD-10-CM

## 2020-07-17 DIAGNOSIS — Z888 Allergy status to other drugs, medicaments and biological substances status: Secondary | ICD-10-CM

## 2020-07-17 DIAGNOSIS — G43909 Migraine, unspecified, not intractable, without status migrainosus: Secondary | ICD-10-CM | POA: Diagnosis present

## 2020-07-17 DIAGNOSIS — I10 Essential (primary) hypertension: Secondary | ICD-10-CM | POA: Diagnosis present

## 2020-07-17 DIAGNOSIS — Z794 Long term (current) use of insulin: Secondary | ICD-10-CM

## 2020-07-17 DIAGNOSIS — F32A Depression, unspecified: Secondary | ICD-10-CM | POA: Diagnosis present

## 2020-07-17 DIAGNOSIS — E1165 Type 2 diabetes mellitus with hyperglycemia: Secondary | ICD-10-CM

## 2020-07-17 DIAGNOSIS — K852 Alcohol induced acute pancreatitis without necrosis or infection: Secondary | ICD-10-CM | POA: Diagnosis not present

## 2020-07-17 DIAGNOSIS — F17210 Nicotine dependence, cigarettes, uncomplicated: Secondary | ICD-10-CM | POA: Diagnosis present

## 2020-07-17 DIAGNOSIS — Z7982 Long term (current) use of aspirin: Secondary | ICD-10-CM

## 2020-07-17 DIAGNOSIS — IMO0002 Reserved for concepts with insufficient information to code with codable children: Secondary | ICD-10-CM

## 2020-07-17 DIAGNOSIS — F1010 Alcohol abuse, uncomplicated: Secondary | ICD-10-CM | POA: Diagnosis present

## 2020-07-17 DIAGNOSIS — E1140 Type 2 diabetes mellitus with diabetic neuropathy, unspecified: Secondary | ICD-10-CM

## 2020-07-17 DIAGNOSIS — Z79899 Other long term (current) drug therapy: Secondary | ICD-10-CM

## 2020-07-17 DIAGNOSIS — N400 Enlarged prostate without lower urinary tract symptoms: Secondary | ICD-10-CM | POA: Diagnosis present

## 2020-07-17 DIAGNOSIS — E1142 Type 2 diabetes mellitus with diabetic polyneuropathy: Secondary | ICD-10-CM | POA: Diagnosis present

## 2020-07-17 DIAGNOSIS — Z91041 Radiographic dye allergy status: Secondary | ICD-10-CM

## 2020-07-17 DIAGNOSIS — Z20822 Contact with and (suspected) exposure to covid-19: Secondary | ICD-10-CM | POA: Diagnosis present

## 2020-07-17 DIAGNOSIS — E1169 Type 2 diabetes mellitus with other specified complication: Secondary | ICD-10-CM | POA: Diagnosis present

## 2020-07-17 DIAGNOSIS — K859 Acute pancreatitis without necrosis or infection, unspecified: Secondary | ICD-10-CM | POA: Diagnosis present

## 2020-07-17 DIAGNOSIS — E782 Mixed hyperlipidemia: Secondary | ICD-10-CM | POA: Diagnosis present

## 2020-07-17 DIAGNOSIS — Z720 Tobacco use: Secondary | ICD-10-CM | POA: Diagnosis present

## 2020-07-17 NOTE — ED Triage Notes (Signed)
The pt arrived by [facility] with abd pain and vomitng since this am   Hs of pancreatitis  He drinks heavy every day.  ak amp rt

## 2020-07-17 NOTE — ED Provider Notes (Signed)
Emergency Medicine Provider Triage Evaluation Note  Ricardo Howard , a 60 y.o. male  was evaluated in triage.  Pt complains of abdominal pain, vomiting which began 12 hours ago.  History of pancreatitis.  States this feels exactly like his pancreatitis flare.  States he had beer several days ago.  History of alcohol abuse.  Patient received Zofran and fluids in the field.  Review of Systems  Positive: As above Negative: As above  Physical Exam  There were no vitals taken for this visit. Gen:   Awake, no distress   Resp:  Normal effort  MSK:   Moves extremities without difficulty  Other:  Generalized epigastric/left-sided abdominal tenderness  Medical Decision Making  Medically screening exam initiated at 10:55 PM.  Appropriate orders placed.  [name] was informed that the remainder of the evaluation will be completed by another provider, this initial triage assessment does not replace that evaluation, and the importance of remaining in the ED until their evaluation is complete.     Mare Ferrari, PA-C 07/17/20 2257    Gwyneth Sprout, MD 07/18/20 [phone]

## 2020-07-18 ENCOUNTER — Encounter (HOSPITAL_COMMUNITY): Payer: Self-pay | Admitting: Internal Medicine

## 2020-07-18 DIAGNOSIS — Z20822 Contact with and (suspected) exposure to covid-19: Secondary | ICD-10-CM | POA: Diagnosis present

## 2020-07-18 DIAGNOSIS — E1169 Type 2 diabetes mellitus with other specified complication: Secondary | ICD-10-CM | POA: Diagnosis present

## 2020-07-18 DIAGNOSIS — K852 Alcohol induced acute pancreatitis without necrosis or infection: Secondary | ICD-10-CM | POA: Diagnosis present

## 2020-07-18 DIAGNOSIS — G43909 Migraine, unspecified, not intractable, without status migrainosus: Secondary | ICD-10-CM | POA: Diagnosis present

## 2020-07-18 DIAGNOSIS — Z888 Allergy status to other drugs, medicaments and biological substances status: Secondary | ICD-10-CM | POA: Diagnosis not present

## 2020-07-18 DIAGNOSIS — Z7982 Long term (current) use of aspirin: Secondary | ICD-10-CM | POA: Diagnosis not present

## 2020-07-18 DIAGNOSIS — Z720 Tobacco use: Secondary | ICD-10-CM | POA: Diagnosis present

## 2020-07-18 DIAGNOSIS — Z79899 Other long term (current) drug therapy: Secondary | ICD-10-CM | POA: Diagnosis not present

## 2020-07-18 DIAGNOSIS — E782 Mixed hyperlipidemia: Secondary | ICD-10-CM | POA: Diagnosis present

## 2020-07-18 DIAGNOSIS — E1142 Type 2 diabetes mellitus with diabetic polyneuropathy: Secondary | ICD-10-CM | POA: Diagnosis present

## 2020-07-18 DIAGNOSIS — K859 Acute pancreatitis without necrosis or infection, unspecified: Secondary | ICD-10-CM | POA: Diagnosis present

## 2020-07-18 DIAGNOSIS — F32A Depression, unspecified: Secondary | ICD-10-CM | POA: Diagnosis present

## 2020-07-18 DIAGNOSIS — N400 Enlarged prostate without lower urinary tract symptoms: Secondary | ICD-10-CM | POA: Diagnosis present

## 2020-07-18 DIAGNOSIS — Z794 Long term (current) use of insulin: Secondary | ICD-10-CM | POA: Diagnosis not present

## 2020-07-18 DIAGNOSIS — F17210 Nicotine dependence, cigarettes, uncomplicated: Secondary | ICD-10-CM | POA: Diagnosis present

## 2020-07-18 DIAGNOSIS — F1010 Alcohol abuse, uncomplicated: Secondary | ICD-10-CM | POA: Diagnosis not present

## 2020-07-18 DIAGNOSIS — Z7984 Long term (current) use of oral hypoglycemic drugs: Secondary | ICD-10-CM | POA: Diagnosis not present

## 2020-07-18 DIAGNOSIS — Z91041 Radiographic dye allergy status: Secondary | ICD-10-CM | POA: Diagnosis not present

## 2020-07-18 DIAGNOSIS — I10 Essential (primary) hypertension: Secondary | ICD-10-CM | POA: Diagnosis present

## 2020-07-18 DIAGNOSIS — Z89511 Acquired absence of right leg below knee: Secondary | ICD-10-CM | POA: Diagnosis not present

## 2020-07-18 LAB — COMPREHENSIVE METABOLIC PANEL
ALT: 74 U/L — ABNORMAL HIGH (ref 0–44)
ALT: 57 U/L — ABNORMAL HIGH (ref 0–44)
AST: 51 U/L — ABNORMAL HIGH (ref 15–41)
AST: 34 U/L (ref 15–41)
Albumin: 3.7 g/dL (ref 3.5–5.0)
Albumin: 3.1 g/dL — ABNORMAL LOW (ref 3.5–5.0)
Alkaline Phosphatase: 71 U/L (ref 38–126)
Alkaline Phosphatase: 61 U/L (ref 38–126)
Anion gap: 13 (ref 5–15)
Anion gap: 11 (ref 5–15)
BUN: 8 mg/dL (ref 8–23)
BUN: 8 mg/dL (ref 8–23)
CO2: 21 mmol/L — ABNORMAL LOW (ref 22–32)
CO2: 22 mmol/L (ref 22–32)
Calcium: 9.2 mg/dL (ref 8.9–10.3)
Calcium: 8.7 mg/dL — ABNORMAL LOW (ref 8.9–10.3)
Chloride: 101 mmol/L (ref 98–111)
Chloride: 102 mmol/L (ref 98–111)
Creatinine, Ser: 0.75 mg/dL (ref 0.61–1.24)
Creatinine, Ser: 0.64 mg/dL (ref 0.61–1.24)
GFR, Estimated: >60 mL/min (ref >60)
GFR, Estimated: >60 mL/min (ref >60)
Glucose, Bld: 286 mg/dL — ABNORMAL HIGH (ref 70–99)
Glucose, Bld: 254 mg/dL — ABNORMAL HIGH (ref 70–99)
Potassium: 4.5 mmol/L (ref 3.5–5.1)
Potassium: 4.1 mmol/L (ref 3.5–5.1)
Sodium: 135 mmol/L (ref 135–145)
Sodium: 135 mmol/L (ref 135–145)
Total Bilirubin: 0.8 mg/dL (ref 0.3–1.2)
Total Bilirubin: 0.8 mg/dL (ref 0.3–1.2)
Total Protein: 7.1 g/dL (ref 6.5–8.1)
Total Protein: 6.4 g/dL — ABNORMAL LOW (ref 6.5–8.1)

## 2020-07-18 LAB — CBC
HCT: 42.9 % (ref 39.0–52.0)
Hemoglobin: 14.2 g/dL (ref 13.0–17.0)
MCH: 30.5 pg (ref 26.0–34.0)
MCHC: 33.1 g/dL (ref 30.0–36.0)
MCV: 92.3 fL (ref 80.0–100.0)
Platelets: 159 10*3/uL (ref 150–400)
RBC: 4.65 MIL/uL (ref 4.22–5.81)
RDW: 14.8 % (ref 11.5–15.5)
WBC: 10.9 10*3/uL — ABNORMAL HIGH (ref 4.0–10.5)
nRBC: 0 % (ref 0.0–0.2)

## 2020-07-18 LAB — CBC WITH DIFFERENTIAL/PLATELET
Abs Immature Granulocytes: 0.07 10*3/uL (ref 0.00–0.07)
Basophils Absolute: 0 10*3/uL (ref 0.0–0.1)
Basophils Relative: 0 %
Eosinophils Absolute: 0 10*3/uL (ref 0.0–0.5)
Eosinophils Relative: 0 %
HCT: 46.8 % (ref 39.0–52.0)
Hemoglobin: 15.5 g/dL (ref 13.0–17.0)
Immature Granulocytes: 1 %
Lymphocytes Relative: 5 %
Lymphs Abs: 0.7 10*3/uL (ref 0.7–4.0)
MCH: 30.5 pg (ref 26.0–34.0)
MCHC: 33.1 g/dL (ref 30.0–36.0)
MCV: 91.9 fL (ref 80.0–100.0)
Monocytes Absolute: 0.6 10*3/uL (ref 0.1–1.0)
Monocytes Relative: 5 %
Neutro Abs: 11.1 10*3/uL — ABNORMAL HIGH (ref 1.7–7.7)
Neutrophils Relative %: 89 %
Platelets: 170 10*3/uL (ref 150–400)
RBC: 5.09 MIL/uL (ref 4.22–5.81)
RDW: 14.7 % (ref 11.5–15.5)
WBC: 12.4 10*3/uL — ABNORMAL HIGH (ref 4.0–10.5)
nRBC: 0 % (ref 0.0–0.2)

## 2020-07-18 LAB — URINALYSIS, ROUTINE W REFLEX MICROSCOPIC
Bilirubin Urine: NEGATIVE
Glucose, UA: >=500 mg/dL — AB
Hgb urine dipstick: NEGATIVE
Ketones, ur: 5 mg/dL — AB
Leukocytes,Ua: NEGATIVE
Nitrite: NEGATIVE
Protein, ur: NEGATIVE mg/dL
Specific Gravity, Urine: 1.022 (ref 1.005–1.030)
pH: 5 (ref 5.0–8.0)

## 2020-07-18 LAB — ETHANOL: Alcohol, Ethyl (B): <10 mg/dL (ref <10)

## 2020-07-18 LAB — SARS CORONAVIRUS 2 (TAT 6-24 HRS): SARS Coronavirus 2: NEGATIVE

## 2020-07-18 LAB — GLUCOSE, CAPILLARY
Glucose-Capillary: 218 mg/dL — ABNORMAL HIGH (ref 70–99)
Glucose-Capillary: 211 mg/dL — ABNORMAL HIGH (ref 70–99)
Glucose-Capillary: 197 mg/dL — ABNORMAL HIGH (ref 70–99)

## 2020-07-18 LAB — PHOSPHORUS
Phosphorus: 4.5 mg/dL (ref 2.5–4.6)
Phosphorus: 4.2 mg/dL (ref 2.5–4.6)

## 2020-07-18 LAB — LACTATE DEHYDROGENASE: LDH: 523 U/L — ABNORMAL HIGH (ref 98–192)

## 2020-07-18 LAB — MAGNESIUM
Magnesium: 1.9 mg/dL (ref 1.7–2.4)
Magnesium: 1.8 mg/dL (ref 1.7–2.4)

## 2020-07-18 LAB — LIPASE, BLOOD: Lipase: 437 U/L — ABNORMAL HIGH (ref 11–51)

## 2020-07-18 MED ORDER — BUPROPION HCL ER (SR) 150 MG PO TB12
150.0000 mg | ORAL_TABLET | Freq: Two times a day (BID) | ORAL | Status: DC
  Administered 2020-07-18 – 2020-07-21 (×7): 150 mg via ORAL
  Filled 2020-07-18 (×8): qty 1

## 2020-07-18 MED ORDER — GABAPENTIN 300 MG PO CAPS
300.0000 mg | ORAL_CAPSULE | Freq: Two times a day (BID) | ORAL | Status: DC
  Administered 2020-07-18 – 2020-07-21 (×7): 300 mg via ORAL
  Filled 2020-07-18 (×7): qty 1

## 2020-07-18 MED ORDER — ONDANSETRON HCL 4 MG PO TABS: 4.0000 mg | ORAL_TABLET | Freq: Four times a day (QID) | ORAL | Status: DC | PRN

## 2020-07-18 MED ORDER — METOCLOPRAMIDE HCL 5 MG/ML IJ SOLN
10.0000 mg | INTRAMUSCULAR | Status: AC
  Administered 2020-07-18: 10 mg via INTRAVENOUS
  Filled 2020-07-18: qty 2

## 2020-07-18 MED ORDER — CHLORDIAZEPOXIDE HCL 25 MG PO CAPS: 25.0000 mg | ORAL_CAPSULE | Freq: Four times a day (QID) | ORAL | Status: AC | PRN

## 2020-07-18 MED ORDER — PANTOPRAZOLE SODIUM 40 MG IV SOLR
40.0000 mg | Freq: Once | INTRAVENOUS | Status: AC
  Administered 2020-07-18: 40 mg via INTRAVENOUS
  Filled 2020-07-18: qty 40

## 2020-07-18 MED ORDER — LORAZEPAM 2 MG/ML IJ SOLN: 1.0000 mg | INTRAMUSCULAR | Status: AC | PRN

## 2020-07-18 MED ORDER — CHLORDIAZEPOXIDE HCL 25 MG PO CAPS
25.0000 mg | ORAL_CAPSULE | Freq: Three times a day (TID) | ORAL | Status: AC
  Administered 2020-07-19 (×3): 25 mg via ORAL
  Filled 2020-07-18 (×3): qty 1

## 2020-07-18 MED ORDER — HYDROMORPHONE HCL 1 MG/ML IJ SOLN
1.0000 mg | Freq: Once | INTRAMUSCULAR | Status: AC
  Administered 2020-07-18: 1 mg via INTRAVENOUS
  Filled 2020-07-18: qty 1

## 2020-07-18 MED ORDER — LORAZEPAM 2 MG/ML IJ SOLN: 0.0000 mg | Freq: Two times a day (BID) | INTRAMUSCULAR | Status: DC

## 2020-07-18 MED ORDER — CARVEDILOL 25 MG PO TABS
25.0000 mg | ORAL_TABLET | Freq: Two times a day (BID) | ORAL | Status: DC
  Administered 2020-07-18 – 2020-07-21 (×7): 25 mg via ORAL
  Filled 2020-07-18 (×3): qty 1
  Filled 2020-07-18: qty 8
  Filled 2020-07-18 (×4): qty 1

## 2020-07-18 MED ORDER — SODIUM CHLORIDE 0.9 % IV SOLN: INTRAVENOUS | Status: DC

## 2020-07-18 MED ORDER — FAMOTIDINE IN NACL 20-0.9 MG/50ML-% IV SOLN
20.0000 mg | Freq: Once | INTRAVENOUS | Status: AC
  Administered 2020-07-18: 20 mg via INTRAVENOUS
  Filled 2020-07-18: qty 50

## 2020-07-18 MED ORDER — FENTANYL CITRATE (PF) 100 MCG/2ML IJ SOLN
50.0000 ug | Freq: Once | INTRAMUSCULAR | Status: AC
Start: 2020-07-18 — End: 2020-07-18
  Administered 2020-07-18: 50 ug via INTRAVENOUS
  Filled 2020-07-18: qty 2

## 2020-07-18 MED ORDER — LACTATED RINGERS IV BOLUS
1500.0000 mL | Freq: Once | INTRAVENOUS | Status: AC
  Administered 2020-07-18: 500 mL via INTRAVENOUS

## 2020-07-18 MED ORDER — LOPERAMIDE HCL 2 MG PO CAPS: 2.0000 mg | ORAL_CAPSULE | ORAL | Status: AC | PRN

## 2020-07-18 MED ORDER — CHLORDIAZEPOXIDE HCL 25 MG PO CAPS
25.0000 mg | ORAL_CAPSULE | Freq: Every day | ORAL | Status: AC
  Administered 2020-07-21: 25 mg via ORAL
  Filled 2020-07-18: qty 1

## 2020-07-18 MED ORDER — ADULT MULTIVITAMIN W/MINERALS CH
1.0000 | ORAL_TABLET | Freq: Every day | ORAL | Status: DC
  Administered 2020-07-18 – 2020-07-21 (×4): 1 via ORAL
  Filled 2020-07-18 (×4): qty 1

## 2020-07-18 MED ORDER — CHLORDIAZEPOXIDE HCL 25 MG PO CAPS
25.0000 mg | ORAL_CAPSULE | Freq: Four times a day (QID) | ORAL | Status: AC
  Administered 2020-07-18 (×4): 25 mg via ORAL
  Filled 2020-07-18 (×4): qty 1

## 2020-07-18 MED ORDER — LORAZEPAM 2 MG/ML IJ SOLN
0.5000 mg | Freq: Once | INTRAMUSCULAR | Status: AC
  Administered 2020-07-18: 0.5 mg via INTRAVENOUS
  Filled 2020-07-18: qty 1

## 2020-07-18 MED ORDER — ACETAMINOPHEN 325 MG PO TABS
650.0000 mg | ORAL_TABLET | Freq: Four times a day (QID) | ORAL | Status: DC | PRN
  Administered 2020-07-21: 650 mg via ORAL
  Filled 2020-07-18: qty 2

## 2020-07-18 MED ORDER — FOLIC ACID 1 MG PO TABS
1.0000 mg | ORAL_TABLET | Freq: Every day | ORAL | Status: DC
  Administered 2020-07-18 – 2020-07-21 (×4): 1 mg via ORAL
  Filled 2020-07-18 (×4): qty 1

## 2020-07-18 MED ORDER — ACETAMINOPHEN 650 MG RE SUPP: 650.0000 mg | Freq: Four times a day (QID) | RECTAL | Status: DC | PRN

## 2020-07-18 MED ORDER — ONDANSETRON HCL 4 MG/2ML IJ SOLN: 4.0000 mg | Freq: Four times a day (QID) | INTRAMUSCULAR | Status: DC | PRN

## 2020-07-18 MED ORDER — AMLODIPINE BESYLATE 5 MG PO TABS
5.0000 mg | ORAL_TABLET | Freq: Every day | ORAL | Status: DC
  Administered 2020-07-18 – 2020-07-21 (×4): 5 mg via ORAL
  Filled 2020-07-18 (×4): qty 1

## 2020-07-18 MED ORDER — VITAMIN B-12 1000 MCG PO TABS
500.0000 ug | ORAL_TABLET | Freq: Every day | ORAL | Status: DC
  Administered 2020-07-18 – 2020-07-21 (×4): 500 ug via ORAL
  Filled 2020-07-18 (×4): qty 1

## 2020-07-18 MED ORDER — SERTRALINE HCL 100 MG PO TABS
100.0000 mg | ORAL_TABLET | Freq: Every day | ORAL | Status: DC
  Administered 2020-07-18 – 2020-07-21 (×4): 100 mg via ORAL
  Filled 2020-07-18 (×4): qty 1

## 2020-07-18 MED ORDER — HYDROMORPHONE HCL 1 MG/ML IJ SOLN: 0.5000 mg | INTRAMUSCULAR | Status: DC | PRN

## 2020-07-18 MED ORDER — METOPROLOL TARTRATE 5 MG/5ML IV SOLN
5.0000 mg | Freq: Once | INTRAVENOUS | Status: AC
  Administered 2020-07-18: 5 mg via INTRAVENOUS
  Filled 2020-07-18: qty 5

## 2020-07-18 MED ORDER — TAMSULOSIN HCL 0.4 MG PO CAPS
0.4000 mg | ORAL_CAPSULE | Freq: Every day | ORAL | Status: DC
  Administered 2020-07-18 – 2020-07-21 (×4): 0.4 mg via ORAL
  Filled 2020-07-18 (×4): qty 1

## 2020-07-18 MED ORDER — ONDANSETRON HCL 4 MG/2ML IJ SOLN
4.0000 mg | Freq: Once | INTRAMUSCULAR | Status: AC
  Administered 2020-07-18: 4 mg via INTRAVENOUS
  Filled 2020-07-18: qty 2

## 2020-07-18 MED ORDER — ISOSORBIDE MONONITRATE ER 30 MG PO TB24
30.0000 mg | ORAL_TABLET | Freq: Every day | ORAL | Status: DC
  Administered 2020-07-18 – 2020-07-21 (×4): 30 mg via ORAL
  Filled 2020-07-18 (×4): qty 1

## 2020-07-18 MED ORDER — THIAMINE HCL 100 MG/ML IJ SOLN
100.0000 mg | Freq: Once | INTRAMUSCULAR | Status: AC
  Administered 2020-07-18: 100 mg via INTRAVENOUS
  Filled 2020-07-18: qty 2

## 2020-07-18 MED ORDER — THIAMINE HCL 100 MG/ML IJ SOLN
100.0000 mg | Freq: Every day | INTRAMUSCULAR | Status: DC
  Filled 2020-07-18: qty 2

## 2020-07-18 MED ORDER — MAGNESIUM OXIDE -MG SUPPLEMENT 400 (240 MG) MG PO TABS
400.0000 mg | ORAL_TABLET | Freq: Two times a day (BID) | ORAL | Status: DC
  Administered 2020-07-18 – 2020-07-21 (×7): 400 mg via ORAL
  Filled 2020-07-18 (×7): qty 1

## 2020-07-18 MED ORDER — LOSARTAN POTASSIUM 50 MG PO TABS
25.0000 mg | ORAL_TABLET | Freq: Every day | ORAL | Status: DC
  Administered 2020-07-18 – 2020-07-21 (×4): 25 mg via ORAL
  Filled 2020-07-18 (×4): qty 1

## 2020-07-18 MED ORDER — THIAMINE HCL 100 MG PO TABS
100.0000 mg | ORAL_TABLET | Freq: Every day | ORAL | Status: DC
  Administered 2020-07-18 – 2020-07-21 (×4): 100 mg via ORAL
  Filled 2020-07-18 (×4): qty 1

## 2020-07-18 MED ORDER — ATORVASTATIN CALCIUM 80 MG PO TABS
80.0000 mg | ORAL_TABLET | Freq: Every day | ORAL | Status: DC
  Administered 2020-07-18 – 2020-07-21 (×4): 80 mg via ORAL
  Filled 2020-07-18 (×4): qty 1

## 2020-07-18 MED ORDER — ASPIRIN 81 MG PO CHEW
81.0000 mg | CHEWABLE_TABLET | Freq: Every day | ORAL | Status: DC
  Administered 2020-07-18 – 2020-07-21 (×4): 81 mg via ORAL
  Filled 2020-07-18 (×4): qty 1

## 2020-07-18 MED ORDER — CHLORDIAZEPOXIDE HCL 25 MG PO CAPS
25.0000 mg | ORAL_CAPSULE | ORAL | Status: AC
  Administered 2020-07-20 (×2): 25 mg via ORAL
  Filled 2020-07-18 (×2): qty 1

## 2020-07-18 MED ORDER — HYDROXYZINE HCL 25 MG PO TABS: 25.0000 mg | ORAL_TABLET | Freq: Four times a day (QID) | ORAL | Status: AC | PRN

## 2020-07-18 MED ORDER — LORAZEPAM 2 MG/ML IJ SOLN
0.0000 mg | Freq: Four times a day (QID) | INTRAMUSCULAR | Status: AC
  Administered 2020-07-18: 1 mg via INTRAVENOUS
  Filled 2020-07-18: qty 1

## 2020-07-18 MED ORDER — LORAZEPAM 1 MG PO TABS: 1.0000 mg | ORAL_TABLET | ORAL | Status: AC | PRN

## 2020-07-18 MED ORDER — OXYCODONE HCL 5 MG PO TABS
5.0000 mg | ORAL_TABLET | ORAL | Status: DC | PRN
  Administered 2020-07-18 – 2020-07-20 (×7): 5 mg via ORAL
  Filled 2020-07-18 (×7): qty 1

## 2020-07-18 MED ORDER — POLYETHYLENE GLYCOL 3350 17 G PO PACK: 17.0000 g | PACK | Freq: Every day | ORAL | Status: DC | PRN

## 2020-07-18 MED ORDER — INSULIN ASPART 100 UNIT/ML IJ SOLN: 0.0000 [IU] | Freq: Three times a day (TID) | INTRAMUSCULAR | Status: DC

## 2020-07-18 NOTE — ED Notes (Addendum)
Pt O2 dropped to 87-88% this RN placed pt on 2L [place]. Pt O2 improved to 90s. Dr. Robb Matar notified

## 2020-07-18 NOTE — ED Notes (Signed)
Dr Ortiz at bedside 

## 2020-07-18 NOTE — ED Provider Notes (Signed)
[facility] EMERGENCY DEPARTMENT Provider Note   CSN: [id number] Arrival date & time: 07/17/20  2259     History Chief Complaint  Patient presents with  . Abdominal Pain    Ricardo Howard is a 60 y.o. male.  60 year old male with history of diabetes, pancreatitis, hypertension, hyperlipidemia, alcohol abuse presents to the emergency department complaining of abdominal pain.  Pain is diffuse, across his central abdomen and radiates to his back.  It began at around 17 hours ago, at 7:30 AM.  Pain has remained constant and unrelieved with OTC medications.  He has had watery stool today as well as nausea with persistent vomiting.  Denies fevers.  States pain feels similar to prior episodes of pancreatitis.  He is a regular beer drinker, but states this usually does not bother him.  Thinks his pancreatitis may have flared because of 2 shots of liquor the patient drank 2 days ago.   Abdominal Pain      Past Medical History:  Diagnosis Date  . Alcohol abuse 06/14/2019  . Diabetes mellitus without complication (HCC)   . Essential hypertension 06/14/2019  . Mixed hyperlipidemia due to type 2 diabetes mellitus (HCC) 07/31/2019  . Nicotine dependence, cigarettes, uncomplicated 07/31/2019  . Pancreatitis   . Uncontrolled type 2 diabetes mellitus with diabetic polyneuropathy, with long-term current use of insulin (HCC) 07/31/2019    Patient Active Problem List   Diagnosis Date Noted  . Tobacco abuse 07/18/2020  . 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  . Pancreatitis 10/07/2019  . Dehydration 10/07/2019  . Hyperglycemia 10/07/2019  . Occult blood in stools   . Mixed hyperlipidemia due to type 2 diabetes mellitus (HCC) 07/31/2019  . Uncontrolled type 2 diabetes mellitus with diabetic polyneuropathy, with long-term current use of insulin (HCC) 07/31/2019  . Nicotine dependence, cigarettes, uncomplicated 07/31/2019  . Complaint of melena 07/31/2019  . Pancreatic  duct calculus 06/21/2019  . Elevated LFTs 06/21/2019  . DKA (diabetic ketoacidoses) 06/14/2019  . Acute pancreatitis 06/14/2019  . Alcohol abuse 06/14/2019  . Essential hypertension 06/14/2019    Past Surgical History:  Procedure Laterality Date  . AMPUTATION Right 03/13/2020   Procedure: TRANSMETATARSAL AMPUTATION;  Surgeon: Chuck Hint, MD;  Location: [facility] OR;  Service: Vascular;  Laterality: Right;  . AMPUTATION Right 03/15/2020   Procedure: RIGHT BELOW KNEE AMPUTATION;  Surgeon: Chuck Hint, MD;  Location: [facility] OR;  Service: Vascular;  Laterality: Right;  . CORONARY ANGIOPLASTY WITH STENT PLACEMENT         Family History  Problem Relation Age of Onset  . Other Neg Hx     Social History   Tobacco Use  . Smoking status: Current Every Day Smoker    Packs/day: 0.25    Types: Cigarettes  . Smokeless tobacco: Never Used  Vaping Use  . Vaping Use: Never used  Substance Use Topics  . Alcohol use: Yes    Comment: etoh abuse  . Drug use: Not Currently    Home Medications Prior to Admission medications   Medication Sig Start Date End Date Taking? Authorizing Provider  amLODipine (NORVASC) 5 MG tablet Take 5 mg by mouth daily. 04/25/20  Yes [provider]  aspirin 81 MG tablet Take 81 mg by mouth daily.   Yes [provider]  atorvastatin (LIPITOR) 80 MG tablet Take 80 mg by mouth daily.   Yes [provider]  buPROPion (WELLBUTRIN SR) 150 MG 12 hr tablet Take 150 mg by mouth  in the morning and at bedtime.   Yes [provider]  carvedilol (COREG) 25 MG tablet Take 25 mg by mouth 2 (two) times daily with a meal. 01/20/20  Yes [provider]  folic acid (FOLVITE) 1 MG tablet Take 1 tablet (1 mg total) by mouth daily. 10/11/19  Yes Rodolph Bonghompson, Daniel V, MD  furosemide (LASIX) 20 MG tablet Take 10 mg by mouth daily. 04/25/20  Yes [provider]  gabapentin (NEURONTIN) 300 MG capsule Take 300 mg by mouth in the  morning and at bedtime.   Yes [provider]  hydrOXYzine (VISTARIL) 25 MG capsule Take 25 mg by mouth at bedtime as needed for anxiety. 01/07/20  Yes [provider]  insulin aspart (NOVOLOG) 100 UNIT/ML FlexPen Inject 4 Units into the skin 3 (three) times daily with meals. Patient taking differently: Inject 0-5 Units into the skin See admin instructions. Per sliding scale with meals 10/10/19  Yes Rodolph Bonghompson, Daniel V, MD  insulin glargine (LANTUS) 100 UNIT/ML Solostar Pen Inject 16 Units into the skin daily. 10/10/19  Yes Rodolph Bonghompson, Daniel V, MD  Insulin Pen Needle (NOVOFINE) 30G X 8 MM MISC Inject 10 each into the skin as needed. 10/10/19  Yes Rodolph Bonghompson, Daniel V, MD  isosorbide mononitrate (IMDUR) 30 MG 24 hr tablet Take 30 mg by mouth daily. 01/20/20  Yes [provider]  lipase/protease/amylase (CREON) 36000 UNITS CPEP capsule Take 1 capsule (36,000 Units total) by mouth 3 (three) times daily before meals. 06/17/19  Yes Roberto ScalesNettey, Shayla D, MD  losartan (COZAAR) 50 MG tablet Take 25 mg by mouth daily. 04/25/20  Yes [provider]  magnesium oxide (MAG-OX) 400 (241.3 Mg) MG tablet Take 1 tablet (400 mg total) by mouth 2 (two) times daily. 03/18/20  Yes Burnadette PopAdhikari, Amrit, MD  metFORMIN (GLUCOPHAGE) 500 MG tablet Take 500 mg by mouth daily with breakfast.   Yes [provider]  naproxen sodium (ALEVE) 220 MG tablet Take 440 mg by mouth 2 (two) times daily as needed (headache).   Yes [provider]  nicotine (NICODERM CQ - DOSED IN MG/24 HOURS) 14 mg/24hr patch Place 14 mg onto the skin daily. 04/07/20  Yes [provider]  oxyCODONE-acetaminophen (PERCOCET/ROXICET) 5-325 MG tablet Take 1-2 tablets by mouth every 4 (four) hours as needed. 04/15/20  Yes Clinton Gallantollins, Emma M, PA-C  polyethylene glycol (MIRALAX / GLYCOLAX) 17 g packet Take 17 g by mouth daily as needed for mild constipation. 03/18/20  Yes Burnadette PopAdhikari, Amrit, MD  sertraline (ZOLOFT) 50 MG tablet  Take 100 mg by mouth daily. 03/31/20  Yes [provider]  tamsulosin (FLOMAX) 0.4 MG CAPS capsule Take 0.4 mg by mouth daily. 01/20/20  Yes [provider]  thiamine 100 MG tablet Take 1 tablet (100 mg total) by mouth daily. 10/11/19  Yes Rodolph Bonghompson, Daniel V, MD  vitamin B-12 (CYANOCOBALAMIN) 500 MCG tablet Take 500 mcg by mouth daily. 01/20/20  Yes [provider]  zolpidem (AMBIEN) 10 MG tablet Take 10 mg by mouth at bedtime as needed for sleep.   Yes [provider]  chlorproMAZINE (THORAZINE) 25 MG tablet Take 1 tablet (25 mg total) by mouth 3 (three) times daily as needed for hiccoughs. Patient not taking: No sig reported 03/18/20   Burnadette PopAdhikari, Amrit, MD  nicotine (NICODERM CQ - DOSED IN MG/24 HR) 7 mg/24hr patch Place 7 mg onto the skin daily. 04/07/20   [provider]    Allergies    Iohexol, Lac bovis, Lisinopril, and  Simvastatin  Review of Systems   Review of Systems  Gastrointestinal: Positive for abdominal pain.  Ten systems reviewed and are negative for acute change, except as noted in the HPI.    Physical Exam Updated Vital Signs BP (!) 186/102   Pulse (!) 117   Temp 98.9 F (37.2 C) (Oral)   Resp 19   Ht 5\' 8"  (1.727 m)   Wt 87.1 kg   SpO2 95%   BMI 29.20 kg/m   Physical Exam Vitals and nursing note reviewed.  Constitutional:      General: He is not in acute distress.    Appearance: He is well-developed. He is not diaphoretic.     Comments: Appears uncomfortable and ill, but nontoxic.  HENT:     Head: Normocephalic and atraumatic.  Eyes:     General: No scleral icterus.    Conjunctiva/sclera: Conjunctivae normal.  Cardiovascular:     Rate and Rhythm: Regular rhythm. Tachycardia present.     Pulses: Normal pulses.  Pulmonary:     Effort: Pulmonary effort is normal. No respiratory distress.     Breath sounds: No stridor. No wheezing.     Comments: Respirations even and unlabored Abdominal:     Comments: Abdomen  obese, distended.  There is generalized tenderness to palpation, worse in the epigastrium.  Musculoskeletal:        General: Normal range of motion.     Cervical back: Normal range of motion.     Comments: Right BKA  Skin:    General: Skin is warm and dry.     Coloration: Skin is not pale.     Findings: No erythema or rash.  Neurological:     Mental Status: He is alert and oriented to person, place, and time.     Coordination: Coordination normal.  Psychiatric:        Behavior: Behavior normal.     ED Results / Procedures / Treatments   Labs (all labs ordered are listed, but only abnormal results are displayed) Labs Reviewed  CBC WITH DIFFERENTIAL/PLATELET - Abnormal; Notable for the following components:      Result Value   WBC 12.4 (*)    Neutro Abs 11.1 (*)    All other components within normal limits  COMPREHENSIVE METABOLIC PANEL - Abnormal; Notable for the following components:   CO2 21 (*)    Glucose, Bld 286 (*)    AST 51 (*)    ALT 74 (*)    All other components within normal limits  LIPASE, BLOOD - Abnormal; Notable for the following components:   Lipase 437 (*)    All other components within normal limits  LACTATE DEHYDROGENASE - Abnormal; Notable for the following components:   LDH 523 (*)    All other components within normal limits  SARS CORONAVIRUS 2 (TAT 6-24 HRS)  CULTURE, BLOOD (ROUTINE X 2)  CULTURE, BLOOD (ROUTINE X 2)  ETHANOL  URINALYSIS, ROUTINE W REFLEX MICROSCOPIC  MAGNESIUM  PHOSPHORUS    EKG None  Radiology No results found.  Procedures Procedures   Medications Ordered in ED Medications  lactated ringers bolus 1,500 mL (has no administration in time range)  metoprolol tartrate (LOPRESSOR) injection 5 mg (has no administration in time range)  lactated ringers bolus 1,500 mL (0 mLs Intravenous Stopped 07/18/20 0418)  metoCLOPramide (REGLAN) injection 10 mg (10 mg Intravenous Given 07/18/20 0126)  famotidine (PEPCID) IVPB 20 mg  premix (0 mg Intravenous Stopped 07/18/20 0418)  pantoprazole (PROTONIX) injection 40 mg (  40 mg Intravenous Given 07/18/20 0129)  fentaNYL (SUBLIMAZE) injection 50 mcg (50 mcg Intravenous Given 07/18/20 0126)  HYDROmorphone (DILAUDID) injection 1 mg (1 mg Intravenous Given 07/18/20 0421)  LORazepam (ATIVAN) injection 0.5 mg (0.5 mg Intravenous Given 07/18/20 0423)  thiamine (B-1) injection 100 mg (100 mg Intravenous Given 07/18/20 0424)  ondansetron (ZOFRAN) injection 4 mg (4 mg Intravenous Given 07/18/20 0424)    ED Course  I have reviewed the triage vital signs and the nursing notes.  Pertinent labs & imaging results that were available during my care of the patient were reviewed by me and considered in my medical decision making (see chart for details).  Clinical Course as of 07/18/20 0432  Mon Jul 18, 2020  0300 LDH elevated. Ranson score is 3 c/w 15% mortality risk; moderate. Will require admission. [KH]  0403 Patient reports that his pain has improved slightly.  He is requesting additional medication.  Dilaudid was ordered at 0300, but has yet to be given.  Also awaiting dose of Ativan which may help with nausea in addition to potential for ETOH withdrawal.  On chart review, did not have issues with this during hospitalization in August; no DTs observed while inpatient. [KH]  [phone] Case discussed with Dr. Robb Matar who will admit. [KH]    Clinical Course User Index [KH] Darylene Price   MDM Rules/Calculators/A&P                          60 year old male presents to the emergency department for abdominal pain.  He has a known history of alcohol abuse as well as alcohol induced pancreatitis.  His work up today is c/w pancreatitis.  Being hydrated with IVF.  Pain somewhat improved with initial dose of pain medication; has just received second doses.  Orders placed for CIWA protocol, though no hx of complicated withdrawals in the past.  Case discussed with Dr. Robb Matar of [facility] who will  admit.   Final Clinical Impression(s) / ED Diagnoses Final diagnoses:  Alcohol-induced acute pancreatitis, unspecified complication status    Rx / DC Orders ED Discharge Orders    None       Antony Madura, PA-C 07/18/20 0432    Nira Conn, MD 07/18/20 [phone]

## 2020-07-18 NOTE — H&P (Signed)
History and Physical    Ricardo Howard UVO:[id number] DOB: 05/20/1960 DOA: 07/17/2020  PCP: [facility]  Patient coming from: Home.  I have personally briefly reviewed patient's old medical records in [place]  Chief Complaint: Abdominal pain, nausea and vomiting.  HPI: Ricardo Howard is a 60 y.o. male with medical history significant of alcohol abuse, type 2 diabetes, hyperlipidemia, hypertension, tobacco use, uncontrolled type 2 diabetes, diabetic polyneuropathy, history of recurrent pancreatitis who is coming to the emergency department with above chief complaint for the past 2 days.  The patient states that he feels similar to when he has had pancreatitis.  He states that he has had more than 10 episodes of emesis in the past week plus.  He denies diarrhea, constipation, melena or hematochezia.  He denies fever, chills, sore throat rhinorrhea.  No chest pain, palpitations, diaphoresis, PND, orthopnea or pitting edema of the lower extremities.  No dysuria, frequency or hematuria.  Denies polyuria, polydipsia, polyphagia or blurred vision.  Has had some insomnia secondary to pain.  He has not been able to take his medications regularly due to emesis.  ED Course: Initial vital signs were 98.9 F, pulse 106, respirations 26, BP 164/97 mmHg O2 sat 99% on room air.  Patient received 1500 mL of LR bolus, fentanyl 50 mcg IVP, hydromorphone 1 mg IVP, lorazepam 0.5 mg IVP, metoclopramide 10 mg IVP, ondansetron 4 mg IVP, pantoprazole 40 mg IVP and thiamine 100 mg IVP.  I added metoprolol 5 mg IVP.  Lab work: CBC showed a white count of 12.4, hemoglobin 15.5 g/dL and platelets 170.  Lipase was 437, LDH 523, AST 51 and ALT 74 units/L.  Alcohol level was nondetectable.  CO2 was 21 mmol/L, the rest of the electrolytes are within normal limits.  Glucose 286 mg/dL.  Renal function was unremarkable.  Review of Systems: As per HPI otherwise all other systems reviewed and are  negative.  Past Medical History:  Diagnosis Date  . Alcohol abuse 06/14/2019  . Diabetes mellitus without complication ([place])   . Essential hypertension 06/14/2019  . Mixed hyperlipidemia due to type 2 diabetes mellitus ([place]) 07/31/2019  . Nicotine dependence, cigarettes, uncomplicated 7/42/5956  . Pancreatitis   . Uncontrolled type 2 diabetes mellitus with diabetic polyneuropathy, with long-term current use of insulin ([place]) 07/31/2019    Past Surgical History:  Procedure Laterality Date  . AMPUTATION Right 03/13/2020   Procedure: TRANSMETATARSAL AMPUTATION;  Surgeon: Angelia Mould, MD;  Location: [facility];  Service: Vascular;  Laterality: Right;  . AMPUTATION Right 03/15/2020   Procedure: RIGHT BELOW KNEE AMPUTATION;  Surgeon: Angelia Mould, MD;  Location: [facility];  Service: Vascular;  Laterality: Right;  . CORONARY ANGIOPLASTY WITH STENT PLACEMENT      Social History  reports that he has been smoking cigarettes. He has been smoking about 0.25 packs per day. He has never used smokeless tobacco. He reports current alcohol use. He reports previous drug use.  Allergies  Allergen Reactions  . Iohexol Hives    Patient broke out in hives after injection of Omni 300, will need 13 hour pre-med in future  . [place] Diarrhea    Other reaction(s): Finding of gastrointestinal tract gas, Diarrhea  . Lisinopril Cough  . Simvastatin Itching and Nausea Only    Family History  Problem Relation Age of Onset  . Other Neg Hx    Prior to Admission medications   Medication Sig Start Date End Date Taking? Authorizing Provider  amLODipine (NORVASC) 5 MG tablet Take 5 mg by mouth daily. 04/25/20  Yes [provider]  aspirin 81 MG tablet Take 81 mg by mouth daily.   Yes [provider]  atorvastatin (LIPITOR) 80 MG tablet Take 80 mg by mouth daily.   Yes [provider]  buPROPion (WELLBUTRIN SR) 150 MG 12 hr tablet Take 150 mg by mouth in the morning and at  bedtime.   Yes [provider]  carvedilol (COREG) 25 MG tablet Take 25 mg by mouth 2 (two) times daily with a meal. 01/20/20  Yes [provider]  folic acid (FOLVITE) 1 MG tablet Take 1 tablet (1 mg total) by mouth daily. 10/11/19  Yes Eugenie Filler, MD  furosemide (LASIX) 20 MG tablet Take 10 mg by mouth daily. 04/25/20  Yes [provider]  gabapentin (NEURONTIN) 300 MG capsule Take 300 mg by mouth in the morning and at bedtime.   Yes [provider]  hydrOXYzine (VISTARIL) 25 MG capsule Take 25 mg by mouth at bedtime as needed for anxiety. 01/07/20  Yes [provider]  insulin aspart (NOVOLOG) 100 UNIT/ML FlexPen Inject 4 Units into the skin 3 (three) times daily with meals. Patient taking differently: Inject 0-5 Units into the skin See admin instructions. Per sliding scale with meals 10/10/19  Yes Eugenie Filler, MD  insulin glargine (LANTUS) 100 UNIT/ML Solostar Pen Inject 16 Units into the skin daily. 10/10/19  Yes Eugenie Filler, MD  Insulin Pen Needle (NOVOFINE) 30G X 8 MM MISC Inject 10 each into the skin as needed. 10/10/19  Yes Eugenie Filler, MD  isosorbide mononitrate (IMDUR) 30 MG 24 hr tablet Take 30 mg by mouth daily. 01/20/20  Yes [provider]  lipase/protease/amylase (CREON) 36000 UNITS CPEP capsule Take 1 capsule (36,000 Units total) by mouth 3 (three) times daily before meals. 06/17/19  Yes Oretha Milch D, MD  losartan (COZAAR) 50 MG tablet Take 25 mg by mouth daily. 04/25/20  Yes [provider]  magnesium oxide (MAG-OX) 400 (241.3 Mg) MG tablet Take 1 tablet (400 mg total) by mouth 2 (two) times daily. 03/18/20  Yes Shelly Coss, MD  metFORMIN (GLUCOPHAGE) 500 MG tablet Take 500 mg by mouth daily with breakfast.   Yes [provider]  naproxen sodium (ALEVE) 220 MG tablet Take 440 mg by mouth 2 (two) times daily as needed (headache).   Yes [provider]  nicotine (NICODERM CQ -  DOSED IN MG/24 HOURS) 14 mg/24hr patch Place 14 mg onto the skin daily. 04/07/20  Yes [provider]  oxyCODONE-acetaminophen (PERCOCET/ROXICET) 5-325 MG tablet Take 1-2 tablets by mouth every 4 (four) hours as needed. 04/15/20  Yes Laurence Slate M, PA-C  polyethylene glycol (MIRALAX / GLYCOLAX) 17 g packet Take 17 g by mouth daily as needed for mild constipation. 03/18/20  Yes Shelly Coss, MD  sertraline (ZOLOFT) 50 MG tablet Take 100 mg by mouth daily. 03/31/20  Yes [provider]  tamsulosin (FLOMAX) 0.4 MG CAPS capsule Take 0.4 mg by mouth daily. 01/20/20  Yes [provider]  thiamine 100 MG tablet Take 1 tablet (100 mg total) by mouth daily. 10/11/19  Yes Eugenie Filler, MD  vitamin B-12 (CYANOCOBALAMIN) 500 MCG tablet Take 500 mcg by mouth daily. 01/20/20  Yes [provider]  zolpidem (AMBIEN) 10 MG tablet Take 10 mg by mouth at bedtime as needed for sleep.   Yes [provider]  chlorproMAZINE (THORAZINE) 25 MG  tablet Take 1 tablet (25 mg total) by mouth 3 (three) times daily as needed for hiccoughs. Patient not taking: No sig reported 03/18/20   Shelly Coss, MD  nicotine (NICODERM CQ - DOSED IN MG/24 HR) 7 mg/24hr patch Place 7 mg onto the skin daily. 04/07/20   [provider]    Physical Exam: Vitals:   07/18/20 0215 07/18/20 0245 07/18/20 0300 07/18/20 0330  BP: (!) 191/101 (!) 185/104 (!) 185/98 (!) 189/100  Pulse: (!) 108 (!) 111 (!) 110 (!) 113  Resp: 20 (!) 22 (!) 21 19  Temp:      TempSrc:      SpO2: 93% 93% 93% 95%  Weight:      Height:        Constitutional: NAD, calm, comfortable Eyes: PERRL, lids and conjunctivae normal ENMT: Mucous membranes are mildly dry. Posterior pharynx clear of any exudate or lesions. Neck: normal, supple, no masses, no thyromegaly Respiratory: clear to auscultation bilaterally, no wheezing, no crackles. Normal respiratory effort. No accessory muscle use.  Cardiovascular:  Tachycardic in the 110s., no murmurs / rubs / gallops. No extremity edema. 2+ pedal pulses. No carotid bruits.  Abdomen: No distention.  Bowel sounds positive.  Soft, positive epigastric tenderness, no guarding or rebound, no masses palpated. No hepatosplenomegaly.  Musculoskeletal: no clubbing / cyanosis.  Good ROM, no contractures. Normal muscle tone.  Skin: no acute rashes, lesions, ulcers on very limited dermatological examination. Neurologic: CN 2-12 grossly intact. Sensation intact, DTR normal. Strength 5/5 in all 4.  Psychiatric: Normal judgment and insight. Alert and oriented x 3. Normal mood.   Labs on Admission: I have personally reviewed following labs and imaging studies  CBC: Recent Labs  Lab 07/17/20 2256  WBC 12.4*  NEUTROABS 11.1*  HGB 15.5  HCT 46.8  MCV 91.9  PLT 263    Basic Metabolic Panel: Recent Labs  Lab 07/17/20 2256  NA 135  K 4.5  CL 101  CO2 21*  GLUCOSE 286*  BUN 8  CREATININE 0.75  CALCIUM 9.2    GFR: Estimated Creatinine Clearance: 104.1 mL/min (by C-G formula based on SCr of 0.75 mg/dL).  Liver Function Tests: Recent Labs  Lab 07/17/20 2256  AST 51*  ALT 74*  ALKPHOS 71  BILITOT 0.8  PROT 7.1  ALBUMIN 3.7   Radiological Exams on Admission: No results found.  EKG: Independently reviewed.   Assessment/Plan Principal Problem:   Acute pancreatitis Observation/telemetry. Keep NPO. Continue IV fluids. Analgesics as needed. Antiemetics as needed. Follow-up CBC, CMP and lipase level.  Active Problems: Cellulitis   Alcohol abuse Started on CIWA protocol. Continue folate, MVI and thiamine. Resume magnesium oxide 400 mg p.o. twice daily.    Essential hypertension Resume amlodipine 5 mg p.o. daily. Resume carvedilol 25 mg p.o. twice daily. Resume losartan 25 mg p.o. daily. Monitor BP, heart rate, EGFR and electrolytes.    Mixed hyperlipidemia due to type 2 diabetes mellitus (HCC)  Continue atorvastatin 80 mg p.o.  daily.    Uncontrolled type 2 diabetes mellitus with diabetic polyneuropathy Currently not on insulin. Currently NPO. Continue metformin 500 mg p.o. daily. CBG monitoring every 6 hours while NPO    Tobacco abuse Declined nicotine replacement therapy at this time. Asked him to inform us if he develops any nicotine withdrawal symptoms.    DVT prophylaxis: SCDs. Code Status:   Full code. Family Communication: Disposition Plan:   Patient is from:  Home.  Anticipated DC to:  Home.  Anticipated DC date:  07/20/2020.  Anticipated DC barriers: Clinical status.  Consults called: Admission status:  Observation/telemetry.   Severity of Illness: High severity after presenting with alcohol induced acute pancreatitis and mild delirium tremens.  The patient will need to remain in the hospital for further evaluation and treatment.  Reubin Milan MD [facility]  How to contact the [facility] Attending or Consulting provider [place] or covering provider during after hours [place], for this patient?   1. Check the care team in [facility] and look for a) attending/consulting [facility] provider listed and b) the [facility] team listed 2. Log into [email or web address] and use [place]'s universal password to access. If you do not have the password, please contact the hospital operator. 3. Locate the [facility] provider you are looking for under [facility] and page to a number that you can be directly reached. 4. If you still have difficulty reaching the provider, please page the [facility] (Director on Call) for the Hospitalists listed on amion for assistance.  07/18/2020, 4:17 AM   This document was prepared using Dragon voice recognition software and may contain some unintended transcription errors.

## 2020-07-18 NOTE — Progress Notes (Signed)
PROGRESS NOTE    Ricardo Howard  TIR:[id number] DOB: 1961-02-14 DOA: 07/17/2020 PCP: [facility]   Brief Narrative: Ricardo Howard is a 60 y.o. male with a history of diabetes mellitus type 2, alcohol abuse, hyperlipidemia, hypertension, tobacco use, type 2 diabetes, diabetic polyneuropathy, pancreatitis.  Patient presented secondary to abdominal pain, nausea, vomiting.  On admission, he was found to have an elevated lipase consistent with acute pancreatitis.  No imaging was obtained on admission.  Patient made n.p.o. and started on IV analgesics for pain control.  Patient started IV fluids.   Assessment & Plan:   Principal Problem:   Acute pancreatitis Active Problems:   Alcohol abuse   Essential hypertension   Mixed hyperlipidemia due to type 2 diabetes mellitus (HCC)   Uncontrolled type 2 diabetes mellitus with diabetic polyneuropathy, with long-term current use of insulin (HCC)   Tobacco abuse   Acute pancreatitis Likely etiology is alcohol related. Recurrent issue. History of gallstone pancreatitis in the past; mild LFT rise with downtrend this admission in setting of alcohol use.  No imaging obtained on admission.  Mild leukocytosis, no fevers. Corrected calcium normal. Pain appears to be improved today. -NPO with sips/meds and ice chips -Continue Tylenol as needed for pain -Dilaudid 0.5 mg IV as needed for severe pain while unable to take oral intake -Oxycodone 5 mg as needed for severe pain if able to take p.o.  Alcohol abuse Patient drinks 6-12 12oz beers per day. Last drink was three days ago. He reports no history of alcohol withdrawal. He reports no history of morning alcohol consumption. Hypertension and tachycardia present without other withdrawal symptoms, but patient is also on antihypertensives/beta-blocker that were not given prior. -Continue CIWA -Librium taper to preempt withdrawal symptoms -Social work consult as patient is desiring to quite  drinking  Primary hypertension Patient is on amlodipine, losartan, Coreg, Imdur as an outpatient.  Hyperlipidemia -Continue Lipitor  Diabetes mellitus, type 2 Poorly controlled. Patient is on metformin 500 mg daily and Novolog 4 units TID with meals as an outpatient. Hemoglobin A1C of 11.4% from 1/22. 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 On gabapentin as an outpatient -Continue gabapentin  Tobacco abuse -Continue nicotine patch   DVT prophylaxis: SCDs Code Status:   Code Status: Full Code Family Communication: None at bedside Disposition Plan: Discharge likely in 2-4 days pending ability to advance diet, improvement of pain, management of any developed alcohol withdrawal symptoms   Consultants:   None  Procedures:   None  Antimicrobials:  None   Subjective: Patient reports improvement in pain.  Some vomiting overnight but feels well today.  Pain has been managed with analgesics.  Objective: Vitals:   07/18/20 0445 07/18/20 0530 07/18/20 0621 07/18/20 0800  BP: (!) 155/92 (!) 166/101 (!) 167/105 (!) 160/103  Pulse: (!) 118 (!) 111 (!) 108 (!) 107  Resp: 14 (!) 25  18  Temp:    98.2 F (36.8 C)  TempSrc:    Oral  SpO2: 94% 96%  98%  Weight:      Height:        Intake/Output Summary (Last 24 hours) at 07/18/2020 1004 Last data filed at 07/18/2020 0815 Gross per 24 hour  Intake 500.24 ml  Output --  Net 500.24 ml   Filed Weights   07/17/20 2332  Weight: 87.1 kg    Examination:  General exam: Appears calm and comfortable Respiratory system:  Clear to auscultation. Respiratory effort normal. Cardiovascular system: S1 & S2 heard, RRR. No murmurs, rubs, gallops or clicks. Gastrointestinal system: Abdomen is distended, soft and minimally tender. No organomegaly or masses felt. Normal bowel sounds heard. Central nervous system: Alert and oriented. No focal neurological  deficits. Musculoskeletal: No edema. No calf tenderness.  Right BKA Skin: No cyanosis. No rashes Psychiatry: Judgement and insight appear normal. Mood & affect appropriate.     Data Reviewed: I have personally reviewed following labs and imaging studies  CBC Lab Results  Component Value Date   WBC 10.9 (H) 07/18/2020   RBC 4.65 07/18/2020   HGB 14.2 07/18/2020   HCT 42.9 07/18/2020   MCV 92.3 07/18/2020   MCH 30.5 07/18/2020   PLT 159 07/18/2020   MCHC 33.1 07/18/2020   RDW 14.8 07/18/2020   LYMPHSABS 0.7 07/17/2020   MONOABS 0.6 07/17/2020   EOSABS 0.0 07/17/2020   BASOSABS 0.0 07/17/2020     Last metabolic panel Lab Results  Component Value Date   NA 135 07/18/2020   K 4.1 07/18/2020   CL 102 07/18/2020   CO2 22 07/18/2020   BUN 8 07/18/2020   CREATININE 0.64 07/18/2020   GLUCOSE 254 (H) 07/18/2020   GFRNONAA >60 07/18/2020   GFRAA >60 10/10/2019   CALCIUM 8.7 (L) 07/18/2020   PHOS 4.2 07/18/2020   PROT 6.4 (L) 07/18/2020   ALBUMIN 3.1 (L) 07/18/2020   BILITOT 0.8 07/18/2020   ALKPHOS 61 07/18/2020   AST 34 07/18/2020   ALT 57 (H) 07/18/2020   ANIONGAP 11 07/18/2020    CBG (last 3)  No results for input(s): GLUCAP in the last 72 hours.   GFR: Estimated Creatinine Clearance: 104.1 mL/min (by C-G formula based on SCr of 0.64 mg/dL).  Coagulation Profile: No results for input(s): INR, PROTIME in the last 168 hours.  No results found for this or any previous visit (from the past 240 hour(s)).      Radiology Studies: No results found.      Scheduled Meds: . amLODipine  5 mg Oral Daily  . aspirin  81 mg Oral Daily  . atorvastatin  80 mg Oral Daily  . buPROPion  150 mg Oral BID  . carvedilol  25 mg Oral BID WC  . folic acid  1 mg Oral Daily  . gabapentin  300 mg Oral BID  . isosorbide mononitrate  30 mg Oral Daily  . LORazepam  0-4 mg Intravenous Q6H   Followed by  . [START ON 07/20/2020] LORazepam  0-4 mg Intravenous Q12H  . losartan  25  mg Oral Daily  . magnesium oxide  400 mg Oral BID  . multivitamin with minerals  1 tablet Oral Daily  . sertraline  100 mg Oral Daily  . tamsulosin  0.4 mg Oral Daily  . thiamine  100 mg Oral Daily   Or  . thiamine  100 mg Intravenous Daily  . vitamin B-12  500 mcg Oral Daily   Continuous Infusions: . sodium chloride 100 mL/hr at 07/18/20 0645     LOS: 0 days     Jacquelin Hawking, MD [facility] 07/18/2020, 10:04 AM  If 7PM-7AM, please contact night-coverage [email or web address]

## 2020-07-18 NOTE — ED Notes (Signed)
PA notified of BP 

## 2020-07-19 DIAGNOSIS — E1165 Type 2 diabetes mellitus with hyperglycemia: Secondary | ICD-10-CM

## 2020-07-19 DIAGNOSIS — E1142 Type 2 diabetes mellitus with diabetic polyneuropathy: Secondary | ICD-10-CM

## 2020-07-19 DIAGNOSIS — F1010 Alcohol abuse, uncomplicated: Secondary | ICD-10-CM | POA: Diagnosis not present

## 2020-07-19 DIAGNOSIS — Z794 Long term (current) use of insulin: Secondary | ICD-10-CM

## 2020-07-19 DIAGNOSIS — I10 Essential (primary) hypertension: Secondary | ICD-10-CM

## 2020-07-19 DIAGNOSIS — K852 Alcohol induced acute pancreatitis without necrosis or infection: Secondary | ICD-10-CM | POA: Diagnosis not present

## 2020-07-19 LAB — GLUCOSE, CAPILLARY
Glucose-Capillary: 191 mg/dL — ABNORMAL HIGH (ref 70–99)
Glucose-Capillary: 244 mg/dL — ABNORMAL HIGH (ref 70–99)
Glucose-Capillary: 215 mg/dL — ABNORMAL HIGH (ref 70–99)
Glucose-Capillary: 245 mg/dL — ABNORMAL HIGH (ref 70–99)

## 2020-07-19 LAB — COMPREHENSIVE METABOLIC PANEL
ALT: 45 U/L — ABNORMAL HIGH (ref 0–44)
AST: 28 U/L (ref 15–41)
Albumin: 3 g/dL — ABNORMAL LOW (ref 3.5–5.0)
Alkaline Phosphatase: 55 U/L (ref 38–126)
Anion gap: 7 (ref 5–15)
BUN: 7 mg/dL — ABNORMAL LOW (ref 8–23)
CO2: 27 mmol/L (ref 22–32)
Calcium: 8.5 mg/dL — ABNORMAL LOW (ref 8.9–10.3)
Chloride: 100 mmol/L (ref 98–111)
Creatinine, Ser: 0.69 mg/dL (ref 0.61–1.24)
GFR, Estimated: >60 mL/min (ref >60)
Glucose, Bld: 206 mg/dL — ABNORMAL HIGH (ref 70–99)
Potassium: 3.6 mmol/L (ref 3.5–5.1)
Sodium: 134 mmol/L — ABNORMAL LOW (ref 135–145)
Total Bilirubin: 0.8 mg/dL (ref 0.3–1.2)
Total Protein: 6.5 g/dL (ref 6.5–8.1)

## 2020-07-19 LAB — CBC
HCT: 39.2 % (ref 39.0–52.0)
Hemoglobin: 12.9 g/dL — ABNORMAL LOW (ref 13.0–17.0)
MCH: 30.2 pg (ref 26.0–34.0)
MCHC: 32.9 g/dL (ref 30.0–36.0)
MCV: 91.8 fL (ref 80.0–100.0)
Platelets: 149 10*3/uL — ABNORMAL LOW (ref 150–400)
RBC: 4.27 MIL/uL (ref 4.22–5.81)
RDW: 14.6 % (ref 11.5–15.5)
WBC: 8.9 10*3/uL (ref 4.0–10.5)
nRBC: 0 % (ref 0.0–0.2)

## 2020-07-19 MED ORDER — INSULIN GLARGINE 100 UNIT/ML ~~LOC~~ SOLN
10.0000 [IU] | Freq: Every day | SUBCUTANEOUS | Status: DC
  Administered 2020-07-19 – 2020-07-20 (×2): 10 [IU] via SUBCUTANEOUS
  Filled 2020-07-19 (×3): qty 0.1

## 2020-07-19 MED ORDER — INSULIN ASPART 100 UNIT/ML IJ SOLN
0.0000 [IU] | Freq: Three times a day (TID) | INTRAMUSCULAR | Status: DC
  Administered 2020-07-19 – 2020-07-21 (×5): 5 [IU] via SUBCUTANEOUS

## 2020-07-19 NOTE — Progress Notes (Signed)
PROGRESS NOTE    Ricardo Howard  YKD:[id number] DOB: [birth year] DOA: 07/17/2020 PCP: [facility]   Brief Narrative: Ricardo Howard is a 60 y.o. male with a history of diabetes mellitus type 2, alcohol abuse, hyperlipidemia, hypertension, tobacco use, type 2 diabetes, diabetic polyneuropathy, pancreatitis.  Patient presented secondary to abdominal pain, nausea, vomiting.  On admission, he was found to have an elevated lipase consistent with acute pancreatitis.  No imaging was obtained on admission.  Patient made n.p.o. and started on IV analgesics for pain control.  Patient started IV fluids.   Assessment & Plan:   Principal Problem:   Acute pancreatitis Active Problems:   Alcohol abuse   Essential hypertension   Mixed hyperlipidemia due to type 2 diabetes mellitus (HCC)   Uncontrolled type 2 diabetes mellitus with diabetic polyneuropathy, with long-term current use of insulin (HCC)   Tobacco abuse   Acute pancreatitis Likely etiology is alcohol related. Recurrent issue. History of gallstone pancreatitis in the past; mild LFT rise with downtrend this admission in setting of alcohol use.  No imaging obtained on admission.  Mild leukocytosis, no fevers. Corrected calcium normal. Pain appears to be improved today. -Advance to full liquid diet -Continue Tylenol as needed for pain -Discontinue Dilaudid now that diet is advanced -Oxycodone 5 mg as needed for severe pain  Alcohol abuse Patient drinks 6-12 12oz beers per day. Last drink was three days ago. He reports no history of alcohol withdrawal. He reports no history of morning alcohol consumption. Hypertension and tachycardia present without other withdrawal symptoms, but patient is also on antihypertensives/beta-blocker that were not given prior. -Continue CIWA -Librium taper to preempt withdrawal symptoms -Social work consult as patient is desiring to quite drinking  Primary hypertension Patient is on  amlodipine, losartan, Coreg, Imdur as an outpatient.  Hyperlipidemia -Continue Lipitor  Diabetes mellitus, type 2 Poorly controlled. Patient is on metformin 500 mg daily and Novolog 4 units TID with meals as an outpatient. Hemoglobin A1C of 11.4% from 1/22. 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 On gabapentin as an outpatient -Continue gabapentin  Tobacco abuse -Continue nicotine patch   DVT prophylaxis: SCDs Code Status:   Code Status: Full Code Family Communication: None at bedside Disposition Plan: Discharge likely in 1-2 days if remains stable while advancing diet with no development of severe withdrawal symptoms.   Consultants:   None  Procedures:   None  Antimicrobials:  None   Subjective: Pain is improved today. No emesis overnight.  Objective: Vitals:   07/18/20 1633 07/18/20 2001 07/18/20 2358 07/19/20 0351  BP: 137/68 126/85 118/89 (!) 122/93  Pulse: 94 86 85 88  Resp: 16 18 18 19   Temp: 97.8 F (36.6 C) 97.9 F (36.6 C) 98.1 F (36.7 C) 98 F (36.7 C)  TempSrc: Oral Oral Oral Oral  SpO2: 91% 90% 90% 92%  Weight:      Height:        Intake/Output Summary (Last 24 hours) at 07/19/2020 1351 Last data filed at 07/19/2020 0900 Gross per 24 hour  Intake 1380.66 ml  Output 300 ml  Net 1080.66 ml   Filed Weights   07/17/20 2332 07/18/20 1139  Weight: 87.1 kg 87.1 kg    Examination:  General exam: Appears calm and comfortable Respiratory system: Clear to auscultation. Respiratory effort normal. Cardiovascular system: S1 & S2 heard, RRR. No murmurs, rubs, gallops or clicks. Gastrointestinal system:  Abdomen is nondistended, soft and mild-moderately tender. No organomegaly or masses felt. Normal bowel sounds heard. Central nervous system: Alert and oriented. No focal neurological deficits. Musculoskeletal: No edema. No calf tenderness Skin: No  cyanosis. No rashes Psychiatry: Judgement and insight appear normal. Mood & affect appropriate.     Data Reviewed: I have personally reviewed following labs and imaging studies  CBC Lab Results  Component Value Date   WBC 8.9 07/19/2020   RBC 4.27 07/19/2020   HGB 12.9 (L) 07/19/2020   HCT 39.2 07/19/2020   MCV 91.8 07/19/2020   MCH 30.2 07/19/2020   PLT 149 (L) 07/19/2020   MCHC 32.9 07/19/2020   RDW 14.6 07/19/2020   LYMPHSABS 0.7 07/17/2020   MONOABS 0.6 07/17/2020   EOSABS 0.0 07/17/2020   BASOSABS 0.0 07/17/2020     Last metabolic panel Lab Results  Component Value Date   NA 134 (L) 07/19/2020   K 3.6 07/19/2020   CL 100 07/19/2020   CO2 27 07/19/2020   BUN 7 (L) 07/19/2020   CREATININE 0.69 07/19/2020   GLUCOSE 206 (H) 07/19/2020   GFRNONAA >60 07/19/2020   GFRAA >60 10/10/2019   CALCIUM 8.5 (L) 07/19/2020   PHOS 4.2 07/18/2020   PROT 6.5 07/19/2020   ALBUMIN 3.0 (L) 07/19/2020   BILITOT 0.8 07/19/2020   ALKPHOS 55 07/19/2020   AST 28 07/19/2020   ALT 45 (H) 07/19/2020   ANIONGAP 7 07/19/2020    CBG (last 3)  Recent Labs    07/18/20 2123 07/19/20 0730 07/19/20 1131  GLUCAP 197* 191* 244*     GFR: Estimated Creatinine Clearance: 104.1 mL/min (by C-G formula based on SCr of 0.69 mg/dL).  Coagulation Profile: No results for input(s): INR, PROTIME in the last 168 hours.  Recent Results (from the past 240 hour(s))  SARS CORONAVIRUS 2 (TAT 6-24 HRS) Nasopharyngeal Nasopharyngeal Swab     Status: None   Collection Time: 07/18/20  3:01 AM   Specimen: Nasopharyngeal Swab  Result Value Ref Range Status   SARS Coronavirus 2 NEGATIVE NEGATIVE Final    Comment: (NOTE) SARS-CoV-2 target nucleic acids are NOT DETECTED.  The SARS-CoV-2 RNA is generally detectable in upper and lower respiratory specimens during the acute phase of infection. Negative results do not preclude SARS-CoV-2 infection, do not rule out co-infections with other pathogens, and  should not be used as the sole basis for treatment or other patient management decisions. Negative results must be combined with clinical observations, patient history, and epidemiological information. The expected result is Negative.  Fact Sheet for Patients: HairSlick.no  Fact Sheet for Healthcare Providers: quierodirigir.com  This test is not yet approved or cleared by the [place] FDA and  has been authorized for detection and/or diagnosis of SARS-CoV-2 by FDA under an Emergency Use Authorization (EUA). This EUA will remain  in effect (meaning this test can be used) for the duration of the COVID-19 declaration under Se ction 564(b)(1) of the Act, 21 U.S.C. section 360bbb-3(b)(1), unless the authorization is terminated or revoked sooner.  Performed at [facility], [address] [place], [place] [address]   Culture, blood (Routine X 2) w Reflex to ID Panel     Status: None (Preliminary result)   Collection Time: 07/18/20  4:39 AM   Specimen: BLOOD RIGHT HAND  Result Value Ref Range Status   Specimen Description BLOOD RIGHT HAND  Final   Special Requests   Final    BOTTLES DRAWN AEROBIC AND ANAEROBIC Blood Culture  results may not be optimal due to an inadequate volume of blood received in culture bottles   Culture   Final    NO GROWTH 1 DAY Performed at [facility], [address], [place], [place] [address]    Report Status PENDING  Incomplete  Culture, blood (Routine X 2) w Reflex to ID Panel     Status: None (Preliminary result)   Collection Time: 07/18/20  4:47 AM   Specimen: BLOOD LEFT HAND  Result Value Ref Range Status   Specimen Description BLOOD LEFT HAND  Final   Special Requests   Final    BOTTLES DRAWN AEROBIC AND ANAEROBIC Blood Culture results may not be optimal due to an inadequate volume of blood received in culture bottles   Culture   Final    NO GROWTH 1 DAY Performed at [facility], [address] [place], [place] [address]    Report Status PENDING  Incomplete        Radiology Studies: No results found.      Scheduled Meds: . amLODipine  5 mg Oral Daily  . aspirin  81 mg Oral Daily  . atorvastatin  80 mg Oral Daily  . buPROPion  150 mg Oral BID  . carvedilol  25 mg Oral BID WC  . chlordiazePOXIDE  25 mg Oral TID   Followed by  . [START ON 07/20/2020] chlordiazePOXIDE  25 mg Oral BH-qamhs   Followed by  . [START ON 07/21/2020] chlordiazePOXIDE  25 mg Oral Daily  . folic acid  1 mg Oral Daily  . gabapentin  300 mg Oral BID  . isosorbide mononitrate  30 mg Oral Daily  . LORazepam  0-4 mg Intravenous Q6H   Followed by  . [START ON 07/20/2020] LORazepam  0-4 mg Intravenous Q12H  . losartan  25 mg Oral Daily  . magnesium oxide  400 mg Oral BID  . multivitamin with minerals  1 tablet Oral Daily  . sertraline  100 mg Oral Daily  . tamsulosin  0.4 mg Oral Daily  . thiamine  100 mg Oral Daily   Or  . thiamine  100 mg Intravenous Daily  . vitamin B-12  500 mcg Oral Daily   Continuous Infusions: . sodium chloride 100 mL/hr at 07/19/20 0657     LOS: 1 day     Jacquelin Hawking, MD [facility] 07/19/2020, 1:51 PM  If 7PM-7AM, please contact night-coverage [email or web address]

## 2020-07-19 NOTE — Plan of Care (Signed)

## 2020-07-19 NOTE — Progress Notes (Signed)
[facility] Notification ID completed: [id number]

## 2020-07-20 DIAGNOSIS — K852 Alcohol induced acute pancreatitis without necrosis or infection: Secondary | ICD-10-CM | POA: Diagnosis not present

## 2020-07-20 DIAGNOSIS — E1169 Type 2 diabetes mellitus with other specified complication: Secondary | ICD-10-CM

## 2020-07-20 DIAGNOSIS — I10 Essential (primary) hypertension: Secondary | ICD-10-CM | POA: Diagnosis not present

## 2020-07-20 DIAGNOSIS — E782 Mixed hyperlipidemia: Secondary | ICD-10-CM

## 2020-07-20 DIAGNOSIS — E1142 Type 2 diabetes mellitus with diabetic polyneuropathy: Secondary | ICD-10-CM | POA: Diagnosis not present

## 2020-07-20 LAB — COMPREHENSIVE METABOLIC PANEL
ALT: 38 U/L (ref 0–44)
AST: 36 U/L (ref 15–41)
Albumin: 2.8 g/dL — ABNORMAL LOW (ref 3.5–5.0)
Alkaline Phosphatase: 51 U/L (ref 38–126)
Anion gap: 10 (ref 5–15)
BUN: 6 mg/dL — ABNORMAL LOW (ref 8–23)
CO2: 22 mmol/L (ref 22–32)
Calcium: 8.2 mg/dL — ABNORMAL LOW (ref 8.9–10.3)
Chloride: 101 mmol/L (ref 98–111)
Creatinine, Ser: 0.61 mg/dL (ref 0.61–1.24)
GFR, Estimated: >60 mL/min (ref >60)
Glucose, Bld: 239 mg/dL — ABNORMAL HIGH (ref 70–99)
Potassium: 3.9 mmol/L (ref 3.5–5.1)
Sodium: 133 mmol/L — ABNORMAL LOW (ref 135–145)
Total Bilirubin: 0.9 mg/dL (ref 0.3–1.2)
Total Protein: 6 g/dL — ABNORMAL LOW (ref 6.5–8.1)

## 2020-07-20 LAB — GLUCOSE, CAPILLARY
Glucose-Capillary: 204 mg/dL — ABNORMAL HIGH (ref 70–99)
Glucose-Capillary: 225 mg/dL — ABNORMAL HIGH (ref 70–99)
Glucose-Capillary: 219 mg/dL — ABNORMAL HIGH (ref 70–99)
Glucose-Capillary: 193 mg/dL — ABNORMAL HIGH (ref 70–99)

## 2020-07-20 MED ORDER — KETOROLAC TROMETHAMINE 30 MG/ML IJ SOLN
30.0000 mg | Freq: Four times a day (QID) | INTRAMUSCULAR | Status: DC | PRN
  Filled 2020-07-20: qty 1

## 2020-07-20 NOTE — Progress Notes (Addendum)
PROGRESS NOTE        PATIENT DETAILS Name: Ricardo Howard Age: 60 y.o. Sex: male Date of Birth: Sep 20, 1960 Admit Date: 07/17/2020 Admitting Physician Bobette Mo, MD ENI:[id number], [facility]  Brief Narrative: Patient is a 60 y.o. male alcohol use, DM-2, HLD, HTN, prior history of pancreatitis-presented with abdominal pain, nausea and vomiting-found to have acute pancreatitis and admitted to the hospitalist service.  See below for further details.  Significant events: 5/29>> presented with abdominal pain/nausea/vomiting-found to have acute pancreatitis.  Significant studies: 08/02/2019: RUQ ultrasound: No cholelithiasis  Antimicrobial therapy: None  Microbiology data: 5/30>> blood culture: No growth  Procedures : None  Consults: None  DVT Prophylaxis : SCDs Start: 07/18/20 0604   Subjective: Although abdominal pain better-still has some epigastric tenderness.  He is more concerned about a migraine headache this morning.    Assessment/Plan: Acute recurrent alcoholic pancreatitis: Slowly improving-we will keep on full liquids for 1 more day-continue with IV fluids and other supportive care.   EtOH use: Last drink approximately 4 days back-awake/alert-continue Librium taper.  Migraine headache: Claims to have the beginnings of a migraine headache this morning-IV Toradol ordered.  DM-2 (A1c 11.4 on 03/12/2020): CBGs reasonable-continue Lantus 10 units daily-and SSI-as diet is advanced-we will attempt a bit more strict glycemic control.  Recent Labs    07/19/20 1627 07/19/20 2132 07/20/20 0757  GLUCAP 215* 245* 204*    HTN: BP controlled-continue amlodipine, Coreg and Imdur.  HLD: On statin  Diabetic polyneuropathy: Stable on Neurontin  BPH: Continue Flomax  Depression: Appears stable-continue   Diet: Diet Order            Diet full liquid Room service appropriate? Yes; Fluid consistency: Thin  Diet effective now                   Code Status: Full code  Family Communication:  Disposition Plan: Status is: Inpatient  Remains inpatient appropriate because:Inpatient level of care appropriate due to severity of illness   Dispo: The patient is from: Home              Anticipated d/c is to: Home              Patient currently is not medically stable to d/c.   Difficult to place patient No    Barriers to Discharge: Resolving pancreatitis-not yet ready for discharge.  Antimicrobial agents: Anti-infectives (From admission, onward)   None       Time spent: 35 minutes-Greater than 50% of this time was spent in counseling, explanation of diagnosis, planning of further management, and coordination of care.  MEDICATIONS: Scheduled Meds: . amLODipine  5 mg Oral Daily  . aspirin  81 mg Oral Daily  . atorvastatin  80 mg Oral Daily  . buPROPion  150 mg Oral BID  . carvedilol  25 mg Oral BID WC  . chlordiazePOXIDE  25 mg Oral BH-qamhs   Followed by  . [START ON 07/21/2020] chlordiazePOXIDE  25 mg Oral Daily  . folic acid  1 mg Oral Daily  . gabapentin  300 mg Oral BID  . insulin aspart  0-15 Units Subcutaneous TID WC  . insulin glargine  10 Units Subcutaneous QHS  . isosorbide mononitrate  30 mg Oral Daily  . LORazepam  0-4 mg Intravenous Q12H  . losartan  25 mg Oral Daily  .  magnesium oxide  400 mg Oral BID  . multivitamin with minerals  1 tablet Oral Daily  . sertraline  100 mg Oral Daily  . tamsulosin  0.4 mg Oral Daily  . thiamine  100 mg Oral Daily   Or  . thiamine  100 mg Intravenous Daily  . vitamin B-12  500 mcg Oral Daily   Continuous Infusions: . sodium chloride 100 mL/hr at 07/20/20 0309   PRN Meds:.acetaminophen **OR** acetaminophen, chlordiazePOXIDE, hydrOXYzine, ketorolac, loperamide, LORazepam **OR** LORazepam, ondansetron **OR** ondansetron (ZOFRAN) IV, oxyCODONE, polyethylene glycol   PHYSICAL EXAM: Vital signs: Vitals:   07/19/20 2130 07/20/20 0000 07/20/20 0541  07/20/20 0556  BP: 121/79  131/86   Pulse: 89 85 88 86  Resp: 18  16   Temp: 98.7 F (37.1 C)  98.7 F (37.1 C)   TempSrc: Axillary  Axillary   SpO2: 96%  92%   Weight:      Height:       Filed Weights   07/17/20 2332 07/18/20 1139  Weight: 87.1 kg 87.1 kg   Body mass index is 29.2 kg/m.   Gen Exam:Alert awake-not in any distress HEENT:atraumatic, normocephalic Chest: B/L clear to auscultation anteriorly CVS:S1S2 regular Abdomen: Soft-mild epigastric tenderness without any rebound. Extremities:no edema Neurology: Non focal Skin: no rash  I have personally reviewed following labs and imaging studies  LABORATORY DATA: CBC: Recent Labs  Lab 07/17/20 2256 07/18/20 0556 07/19/20 0103  WBC 12.4* 10.9* 8.9  NEUTROABS 11.1*  --   --   HGB 15.5 14.2 12.9*  HCT 46.8 42.9 39.2  MCV 91.9 92.3 91.8  PLT 170 159 149*    Basic Metabolic Panel: Recent Labs  Lab 07/17/20 2256 07/18/20 0027 07/18/20 0556 07/19/20 0103 07/20/20 0025  NA 135  --  135 134* 133*  K 4.5  --  4.1 3.6 3.9  CL 101  --  102 100 101  CO2 21*  --  22 27 22   GLUCOSE 286*  --  254* 206* 239*  BUN 8  --  8 7* 6*  CREATININE 0.75  --  0.64 0.69 0.61  CALCIUM 9.2  --  8.7* 8.5* 8.2*  MG  --  1.9 1.8  --   --   PHOS  --  4.5 4.2  --   --     GFR: Estimated Creatinine Clearance: 104.1 mL/min (by C-G formula based on SCr of 0.61 mg/dL).  Liver Function Tests: Recent Labs  Lab 07/17/20 2256 07/18/20 0556 07/19/20 0103 07/20/20 0025  AST 51* 34 28 36  ALT 74* 57* 45* 38  ALKPHOS 71 61 55 51  BILITOT 0.8 0.8 0.8 0.9  PROT 7.1 6.4* 6.5 6.0*  ALBUMIN 3.7 3.1* 3.0* 2.8*   Recent Labs  Lab 07/17/20 2256  LIPASE 437*   No results for input(s): AMMONIA in the last 168 hours.  Coagulation Profile: No results for input(s): INR, PROTIME in the last 168 hours.  Cardiac Enzymes: No results for input(s): CKTOTAL, CKMB, CKMBINDEX, TROPONINI in the last 168 hours.  BNP (last 3 results) No  results for input(s): PROBNP in the last 8760 hours.  Lipid Profile: No results for input(s): CHOL, HDL, LDLCALC, TRIG, CHOLHDL, LDLDIRECT in the last 72 hours.  Thyroid Function Tests: No results for input(s): TSH, T4TOTAL, FREET4, T3FREE, THYROIDAB in the last 72 hours.  Anemia Panel: No results for input(s): VITAMINB12, FOLATE, FERRITIN, TIBC, IRON, RETICCTPCT in the last 72 hours.  Urine analysis:    Component Value  Date/Time   COLORURINE YELLOW 07/18/2020 0507   APPEARANCEUR CLEAR 07/18/2020 0507   LABSPEC 1.022 07/18/2020 0507   PHURINE 5.0 07/18/2020 0507   GLUCOSEU >=500 (A) 07/18/2020 0507   HGBUR NEGATIVE 07/18/2020 0507   BILIRUBINUR NEGATIVE 07/18/2020 0507   KETONESUR 5 (A) 07/18/2020 0507   PROTEINUR NEGATIVE 07/18/2020 0507   NITRITE NEGATIVE 07/18/2020 0507   LEUKOCYTESUR NEGATIVE 07/18/2020 0507    Sepsis Labs: Lactic Acid, Venous    Component Value Date/Time   LATICACIDVEN 1.5 03/12/2020 1342    MICROBIOLOGY: Recent Results (from the past 240 hour(s))  SARS CORONAVIRUS 2 (TAT 6-24 HRS) Nasopharyngeal Nasopharyngeal Swab     Status: None   Collection Time: 07/18/20  3:01 AM   Specimen: Nasopharyngeal Swab  Result Value Ref Range Status   SARS Coronavirus 2 NEGATIVE NEGATIVE Final    Comment: (NOTE) SARS-CoV-2 target nucleic acids are NOT DETECTED.  The SARS-CoV-2 RNA is generally detectable in upper and lower respiratory specimens during the acute phase of infection. Negative results do not preclude SARS-CoV-2 infection, do not rule out co-infections with other pathogens, and should not be used as the sole basis for treatment or other patient management decisions. Negative results must be combined with clinical observations, patient history, and epidemiological information. The expected result is Negative.  Fact Sheet for Patients: HairSlick.nohttps://www.fda.gov/media/138098/download  Fact Sheet for Healthcare  Providers: quierodirigir.comhttps://www.fda.gov/media/138095/download  This test is not yet approved or cleared by the [place] FDA and  has been authorized for detection and/or diagnosis of SARS-CoV-2 by FDA under an Emergency Use Authorization (EUA). This EUA will remain  in effect (meaning this test can be used) for the duration of the COVID-19 declaration under Se ction 564(b)(1) of the Act, 21 U.S.C. section 360bbb-3(b)(1), unless the authorization is terminated or revoked sooner.  Performed at [facility], [address] [place], [place] [address]   Culture, blood (Routine X 2) w Reflex to ID Panel     Status: None (Preliminary result)   Collection Time: 07/18/20  4:39 AM   Specimen: BLOOD RIGHT HAND  Result Value Ref Range Status   Specimen Description BLOOD RIGHT HAND  Final   Special Requests   Final    BOTTLES DRAWN AEROBIC AND ANAEROBIC Blood Culture results may not be optimal due to an inadequate volume of blood received in culture bottles   Culture   Final    NO GROWTH 2 DAYS Performed at [facility], [address] [place], [place] [address]    Report Status PENDING  Incomplete  Culture, blood (Routine X 2) w Reflex to ID Panel     Status: None (Preliminary result)   Collection Time: 07/18/20  4:47 AM   Specimen: BLOOD LEFT HAND  Result Value Ref Range Status   Specimen Description BLOOD LEFT HAND  Final   Special Requests   Final    BOTTLES DRAWN AEROBIC AND ANAEROBIC Blood Culture results may not be optimal due to an inadequate volume of blood received in culture bottles   Culture   Final    NO GROWTH 2 DAYS Performed at [facility], [address] [place], [place] [address]    Report Status PENDING  Incomplete    RADIOLOGY STUDIES/RESULTS: No results found.   LOS: 2 days   Jeoffrey MassedShanker Sharin Altidor, MD  [facility]    To contact the attending provider between 7A-7P or the covering provider during after hours 7P-7A, please log into the web  site  [email or web address] and access using universal [place] password for that web site. If you do not have the password, please call the hospital operator.  07/20/2020, 11:34 AM

## 2020-07-20 NOTE — Plan of Care (Signed)

## 2020-07-21 DIAGNOSIS — K852 Alcohol induced acute pancreatitis without necrosis or infection: Secondary | ICD-10-CM | POA: Diagnosis not present

## 2020-07-21 DIAGNOSIS — E1169 Type 2 diabetes mellitus with other specified complication: Secondary | ICD-10-CM | POA: Diagnosis not present

## 2020-07-21 DIAGNOSIS — I10 Essential (primary) hypertension: Secondary | ICD-10-CM | POA: Diagnosis not present

## 2020-07-21 DIAGNOSIS — E1142 Type 2 diabetes mellitus with diabetic polyneuropathy: Secondary | ICD-10-CM | POA: Diagnosis not present

## 2020-07-21 LAB — COMPREHENSIVE METABOLIC PANEL
ALT: 31 U/L (ref 0–44)
AST: 27 U/L (ref 15–41)
Albumin: 2.6 g/dL — ABNORMAL LOW (ref 3.5–5.0)
Alkaline Phosphatase: 56 U/L (ref 38–126)
Anion gap: 8 (ref 5–15)
BUN: <5 mg/dL — ABNORMAL LOW (ref 8–23)
CO2: 25 mmol/L (ref 22–32)
Calcium: 8.2 mg/dL — ABNORMAL LOW (ref 8.9–10.3)
Chloride: 103 mmol/L (ref 98–111)
Creatinine, Ser: 0.68 mg/dL (ref 0.61–1.24)
GFR, Estimated: >60 mL/min (ref >60)
Glucose, Bld: 178 mg/dL — ABNORMAL HIGH (ref 70–99)
Potassium: 3.4 mmol/L — ABNORMAL LOW (ref 3.5–5.1)
Sodium: 136 mmol/L (ref 135–145)
Total Bilirubin: 0.8 mg/dL (ref 0.3–1.2)
Total Protein: 6.1 g/dL — ABNORMAL LOW (ref 6.5–8.1)

## 2020-07-21 LAB — GLUCOSE, CAPILLARY
Glucose-Capillary: 201 mg/dL — ABNORMAL HIGH (ref 70–99)
Glucose-Capillary: 200 mg/dL — ABNORMAL HIGH (ref 70–99)

## 2020-07-21 MED ORDER — POTASSIUM CHLORIDE CRYS ER 20 MEQ PO TBCR
40.0000 meq | EXTENDED_RELEASE_TABLET | Freq: Once | ORAL | Status: AC
  Administered 2020-07-21: 40 meq via ORAL
  Filled 2020-07-21: qty 2

## 2020-07-21 NOTE — Discharge Summary (Signed)
PATIENT DETAILS Name: Ricardo Howard Age: 60 y.o. Sex: male Date of Birth: 1960-09-07 MRN: [id number]. Admitting Physician: Bobette Mo, MD [id number], Lenn Sink  Admit Date: 07/17/2020 Discharge date: 07/21/2020  Recommendations for Outpatient Follow-up:  1. Follow up with PCP in 1-2 weeks 2. Please obtain CMP/CBC in one week 3. Continue to counsel regarding importance of avoiding alcohol use.  Admitted From:  Home  Disposition: Home  Home Health: No  Equipment/Devices: None  Discharge Condition: Stable  CODE STATUS: FULL CODE  Diet recommendation:  Diet Order            Diet - low sodium heart healthy           Diet Carb Modified           Diet full liquid Room service appropriate? Yes; Fluid consistency: Thin  Diet effective now                  Brief Narrative: Patient is a 60 y.o. male alcohol use, DM-2, HLD, HTN, prior history of pancreatitis-presented with abdominal pain, nausea and vomiting-found to have acute pancreatitis and admitted to the hospitalist service.  See below for further details.  Significant events: 5/29>> presented with abdominal pain/nausea/vomiting-found to have acute pancreatitis.  Significant studies: 08/02/2019: RUQ ultrasound: No cholelithiasis  Antimicrobial therapy: None  Microbiology data: 5/30>> blood culture: No growth  Procedures : None  Consults: None  Brief Hospital Course: Acute recurrent alcoholic pancreatitis: Treated with supportive care-briefly kept n.p.o.-diet advanced.  By day of discharge significantly better-requesting that he be discharged home today.  I have asked him to stay on a soft/full liquid diet for 1 additional week.  Abdominal exam is markedly improved with hardly any tenderness on exam.  He has been counseled regarding importance of stopping alcohol use altogether.  EtOH use: Last drink approximately 5 days back-awake/alert-treated with Librium taper.  Suspect  should be out of the window for any withdrawal symptoms at this point.  Migraine headache: Treated with as needed NSAIDs.  DM-2 (A1c 11.4 on 03/12/2020): CBGs slowly creeping up-resume usual insulin regimen and oral hypoglycemic regimen on discharge.  Follow with primary care MD for further optimization.    HTN: BP controlled-continue amlodipine, Coreg and Imdur.  HLD: On statin  Diabetic polyneuropathy: Stable on Neurontin  BPH: Continue Flomax  Depression: Appears stable-continue Wellbutrin and Zoloft.  Discharge Diagnoses:  Principal Problem:   Acute pancreatitis Active Problems:   Alcohol abuse   Essential hypertension   Mixed hyperlipidemia due to type 2 diabetes mellitus (HCC)   Uncontrolled type 2 diabetes mellitus with diabetic polyneuropathy, with long-term current use of insulin (HCC)   Tobacco abuse   Discharge Instructions:  Activity:  As tolerated   Discharge Instructions    Call MD for:  persistant nausea and vomiting   Complete by: As directed    Call MD for:  severe uncontrolled pain   Complete by: As directed    Diet - low sodium heart healthy   Complete by: As directed    Stay on a full liquid/soft diet for 1 additional week before advancing any further.  Stay on a fat-free diet.   Diet Carb Modified   Complete by: As directed    Increase activity slowly   Complete by: As directed      Allergies as of 07/21/2020      Reactions   Iohexol Hives   Patient broke out in hives after injection of Omni 300, will need 13  hour pre-med in future   Lac Bovis Diarrhea   Other reaction(s): Finding of gastrointestinal tract gas, Diarrhea   Lisinopril Cough   Simvastatin Itching, Nausea Only      Medication List    STOP taking these medications   chlorproMAZINE 25 MG tablet Commonly known as: THORAZINE   furosemide 20 MG tablet Commonly known as: LASIX   oxyCODONE-acetaminophen 5-325 MG tablet Commonly known as: PERCOCET/ROXICET     TAKE  these medications   amLODipine 5 MG tablet Commonly known as: NORVASC Take 5 mg by mouth daily.   aspirin 81 MG tablet Take 81 mg by mouth daily.   atorvastatin 80 MG tablet Commonly known as: LIPITOR Take 80 mg by mouth daily.   buPROPion 150 MG 12 hr tablet Commonly known as: WELLBUTRIN SR Take 150 mg by mouth in the morning and at bedtime.   carvedilol 25 MG tablet Commonly known as: COREG Take 25 mg by mouth 2 (two) times daily with a meal.   folic acid 1 MG tablet Commonly known as: FOLVITE Take 1 tablet (1 mg total) by mouth daily.   gabapentin 300 MG capsule Commonly known as: NEURONTIN Take 300 mg by mouth in the morning and at bedtime.   hydrOXYzine 25 MG capsule Commonly known as: VISTARIL Take 25 mg by mouth at bedtime as needed for anxiety.   insulin aspart 100 UNIT/ML FlexPen Commonly known as: NOVOLOG Inject 4 Units into the skin 3 (three) times daily with meals. What changed:   how much to take  when to take this  additional instructions   insulin glargine 100 UNIT/ML Solostar Pen Commonly known as: LANTUS Inject 16 Units into the skin daily.   Insulin Pen Needle 30G X 8 MM Misc Commonly known as: NOVOFINE Inject 10 each into the skin as needed.   isosorbide mononitrate 30 MG 24 hr tablet Commonly known as: IMDUR Take 30 mg by mouth daily.   lipase/protease/amylase 26834 UNITS Cpep capsule Commonly known as: CREON Take 1 capsule (36,000 Units total) by mouth 3 (three) times daily before meals.   losartan 50 MG tablet Commonly known as: COZAAR Take 25 mg by mouth daily.   magnesium oxide 400 (241.3 Mg) MG tablet Commonly known as: MAG-OX Take 1 tablet (400 mg total) by mouth 2 (two) times daily.   metFORMIN 500 MG tablet Commonly known as: GLUCOPHAGE Take 500 mg by mouth daily with breakfast.   naproxen sodium 220 MG tablet Commonly known as: ALEVE Take 440 mg by mouth 2 (two) times daily as needed (headache).   nicotine 7  mg/24hr patch Commonly known as: NICODERM CQ - dosed in mg/24 hr Place 7 mg onto the skin daily.   nicotine 14 mg/24hr patch Commonly known as: NICODERM CQ - dosed in mg/24 hours Place 14 mg onto the skin daily.   polyethylene glycol 17 g packet Commonly known as: MIRALAX / GLYCOLAX Take 17 g by mouth daily as needed for mild constipation.   sertraline 50 MG tablet Commonly known as: ZOLOFT Take 100 mg by mouth daily.   tamsulosin 0.4 MG Caps capsule Commonly known as: FLOMAX Take 0.4 mg by mouth daily.   thiamine 100 MG tablet Take 1 tablet (100 mg total) by mouth daily.   vitamin B-12 500 MCG tablet Commonly known as: CYANOCOBALAMIN Take 500 mcg by mouth daily.   zolpidem 10 MG tablet Commonly known as: AMBIEN Take 10 mg by mouth at bedtime as needed for sleep.  Allergies  Allergen Reactions  . Iohexol Hives    Patient broke out in hives after injection of Omni 300, will need 13 hour pre-med in future  . Lac Bovis Diarrhea    Other reaction(s): Finding of gastrointestinal tract gas, Diarrhea  . Lisinopril Cough  . Simvastatin Itching and Nausea Only     Other Procedures/Studies: No results found.   TODAY-DAY OF DISCHARGE:  Subjective:   Ricardo Howard today has no headache,no chest abdominal pain,no new weakness tingling or numbness, feels much better wants to go home today.   Objective:   Blood pressure 136/61, pulse 82, temperature 98 F (36.7 C), temperature source Axillary, resp. rate 19, height 5\' 8"  (1.727 m), weight 87.1 kg, SpO2 92 %.  Intake/Output Summary (Last 24 hours) at 07/21/2020 1216 Last data filed at 07/21/2020 0931 Gross per 24 hour  Intake --  Output 1450 ml  Net -1450 ml   Filed Weights   07/17/20 2332 07/18/20 1139  Weight: 87.1 kg 87.1 kg    Exam: Awake Alert, Oriented *3, No new F.N deficits, Normal affect Wilson.AT,PERRAL Supple Neck,No JVD, No cervical lymphadenopathy appriciated.  Symmetrical Chest wall movement, Good  air movement bilaterally, CTAB RRR,No Gallops,Rubs or new Murmurs, No Parasternal Heave +ve B.Sounds, Abd Soft, Non tender, No organomegaly appriciated, No rebound -guarding or rigidity. No Cyanosis, Clubbing or edema, No new Rash or bruise   PERTINENT RADIOLOGIC STUDIES: No results found.   PERTINENT LAB RESULTS: CBC: Recent Labs    07/19/20 0103  WBC 8.9  HGB 12.9*  HCT 39.2  PLT 149*   CMET CMP     Component Value Date/Time   NA 136 07/21/2020 0135   K 3.4 (L) 07/21/2020 0135   CL 103 07/21/2020 0135   CO2 25 07/21/2020 0135   GLUCOSE 178 (H) 07/21/2020 0135   BUN <5 (L) 07/21/2020 0135   CREATININE 0.68 07/21/2020 0135   CALCIUM 8.2 (L) 07/21/2020 0135   PROT 6.1 (L) 07/21/2020 0135   ALBUMIN 2.6 (L) 07/21/2020 0135   AST 27 07/21/2020 0135   ALT 31 07/21/2020 0135   ALKPHOS 56 07/21/2020 0135   BILITOT 0.8 07/21/2020 0135   GFRNONAA >60 07/21/2020 0135   GFRAA >60 10/10/2019 0620    GFR Estimated Creatinine Clearance: 104.1 mL/min (by C-G formula based on SCr of 0.68 mg/dL). No results for input(s): LIPASE, AMYLASE in the last 72 hours. No results for input(s): CKTOTAL, CKMB, CKMBINDEX, TROPONINI in the last 72 hours. Invalid input(s): POCBNP No results for input(s): DDIMER in the last 72 hours. No results for input(s): HGBA1C in the last 72 hours. No results for input(s): CHOL, HDL, LDLCALC, TRIG, CHOLHDL, LDLDIRECT in the last 72 hours. No results for input(s): TSH, T4TOTAL, T3FREE, THYROIDAB in the last 72 hours.  Invalid input(s): FREET3 No results for input(s): VITAMINB12, FOLATE, FERRITIN, TIBC, IRON, RETICCTPCT in the last 72 hours. Coags: No results for input(s): INR in the last 72 hours.  Invalid input(s): PT Microbiology: Recent Results (from the past 240 hour(s))  SARS CORONAVIRUS 2 (TAT 6-24 HRS) Nasopharyngeal Nasopharyngeal Swab     Status: None   Collection Time: 07/18/20  3:01 AM   Specimen: Nasopharyngeal Swab  Result Value Ref  Range Status   SARS Coronavirus 2 NEGATIVE NEGATIVE Final    Comment: (NOTE) SARS-CoV-2 target nucleic acids are NOT DETECTED.  The SARS-CoV-2 RNA is generally detectable in upper and lower respiratory specimens during the acute phase of infection. Negative results do not preclude SARS-CoV-2  infection, do not rule out co-infections with other pathogens, and should not be used as the sole basis for treatment or other patient management decisions. Negative results must be combined with clinical observations, patient history, and epidemiological information. The expected result is Negative.  Fact Sheet for Patients: HairSlick.no  Fact Sheet for Healthcare Providers: quierodirigir.com  This test is not yet approved or cleared by the [place] FDA and  has been authorized for detection and/or diagnosis of SARS-CoV-2 by FDA under an Emergency Use Authorization (EUA). This EUA will remain  in effect (meaning this test can be used) for the duration of the COVID-19 declaration under Se ction 564(b)(1) of the Act, 21 U.S.C. section 360bbb-3(b)(1), unless the authorization is terminated or revoked sooner.  Performed at [facility], [address] [place], [place] [address]   Culture, blood (Routine X 2) w Reflex to ID Panel     Status: None (Preliminary result)   Collection Time: 07/18/20  4:39 AM   Specimen: BLOOD RIGHT HAND  Result Value Ref Range Status   Specimen Description BLOOD RIGHT HAND  Final   Special Requests   Final    BOTTLES DRAWN AEROBIC AND ANAEROBIC Blood Culture results may not be optimal due to an inadequate volume of blood received in culture bottles   Culture   Final    NO GROWTH 3 DAYS Performed at [facility], [address] [place], [place] [address]    Report Status PENDING  Incomplete  Culture, blood (Routine X 2) w Reflex to ID Panel     Status: None (Preliminary result)   Collection  Time: 07/18/20  4:47 AM   Specimen: BLOOD LEFT HAND  Result Value Ref Range Status   Specimen Description BLOOD LEFT HAND  Final   Special Requests   Final    BOTTLES DRAWN AEROBIC AND ANAEROBIC Blood Culture results may not be optimal due to an inadequate volume of blood received in culture bottles   Culture   Final    NO GROWTH 3 DAYS Performed at [facility], [address], [place], [place] [address]    Report Status PENDING  Incomplete    FURTHER DISCHARGE INSTRUCTIONS:  Get Medicines reviewed and adjusted: Please take all your medications with you for your next visit with your Primary MD  Laboratory/radiological data: Please request your Primary MD to go over all hospital tests and procedure/radiological results at the follow up, please ask your Primary MD to get all Hospital records sent to his/her office.  In some cases, they will be blood work, cultures and biopsy results pending at the time of your discharge. Please request that your primary care M.D. goes through all the records of your hospital data and follows up on these results.  Also Note the following: If you experience worsening of your admission symptoms, develop shortness of breath, life threatening emergency, suicidal or homicidal thoughts you must seek medical attention immediately by calling 911 or calling your MD immediately  if symptoms less severe.  You must read complete instructions/literature along with all the possible adverse reactions/side effects for all the Medicines you take and that have been prescribed to you. Take any new Medicines after you have completely understood and accpet all the possible adverse reactions/side effects.   Do not drive when taking Pain medications or sleeping medications (Benzodaizepines)  Do not take more than prescribed Pain, Sleep and Anxiety Medications. It is not advisable to combine anxiety,sleep and pain medications  without talking with your primary care  practitioner  Special Instructions: If you have smoked or chewed Tobacco  in the last 2 yrs please stop smoking, stop any regular Alcohol  and or any Recreational drug use.  Wear Seat belts while driving.  Please note: You were cared for by a hospitalist during your hospital stay. Once you are discharged, your primary care physician will handle any further medical issues. Please note that NO REFILLS for any discharge medications will be authorized once you are discharged, as it is imperative that you return to your primary care physician (or establish a relationship with a primary care physician if you do not have one) for your post hospital discharge needs so that they can reassess your need for medications and monitor your lab values.  Total Time spent coordinating discharge including counseling, education and face to face time equals 35 minutes.  SignedJeoffrey Massed 07/21/2020 12:16 PM

## 2020-07-21 NOTE — Progress Notes (Signed)
CSW spoke with patient regarding ETOH consult. He was very pleasant and reported that he does not usually have issues with alcohol but that it is when he drinks hard liquor that he has problems. He was at first hesitant to accept resources but then accepted them and stated he will be in touch with his PCP through the [name] for further follow up. Patient asked me a few questions about financial assistance for his brother, which CSW answered. He is scheduled to receive his prosthetic soon.   Joaquin Courts, MSW, [facility]

## 2020-07-21 NOTE — Progress Notes (Signed)
Patient was discharged from [facility]. Patient has all belongings. 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 Patient's vitals are stable and has no complaints of pain. AVS reviewed with patient. No meds or DME ordered. Patient will be taken to main entrance via wheelchair by nurse tech.

## 2020-07-22 LAB — HEMOGLOBIN A1C
Hgb A1c MFr Bld: 10.4 % — ABNORMAL HIGH (ref 4.8–5.6)
Mean Plasma Glucose: 252 mg/dL

## 2020-07-23 LAB — CULTURE, BLOOD (ROUTINE X 2)
Culture: NO GROWTH
Culture: NO GROWTH

## 2020-10-26 ENCOUNTER — Emergency Department (HOSPITAL_COMMUNITY): Payer: No Typology Code available for payment source

## 2020-10-26 ENCOUNTER — Encounter (HOSPITAL_COMMUNITY): Payer: Self-pay | Admitting: *Deleted

## 2020-10-26 ENCOUNTER — Other Ambulatory Visit: Payer: Self-pay

## 2020-10-26 ENCOUNTER — Emergency Department (HOSPITAL_COMMUNITY)
Admission: EM | Admit: 2020-10-26 | Discharge: 2020-10-26 | Disposition: A | Discharge status: Home / Self Care | Payer: No Typology Code available for payment source | Attending: Emergency Medicine | Admitting: Emergency Medicine

## 2020-10-26 DIAGNOSIS — F17210 Nicotine dependence, cigarettes, uncomplicated: Secondary | ICD-10-CM | POA: Insufficient documentation

## 2020-10-26 DIAGNOSIS — Z79899 Other long term (current) drug therapy: Secondary | ICD-10-CM | POA: Insufficient documentation

## 2020-10-26 DIAGNOSIS — K644 Residual hemorrhoidal skin tags: Secondary | ICD-10-CM | POA: Insufficient documentation

## 2020-10-26 DIAGNOSIS — E1110 Type 2 diabetes mellitus with ketoacidosis without coma: Secondary | ICD-10-CM | POA: Diagnosis not present

## 2020-10-26 DIAGNOSIS — R109 Unspecified abdominal pain: Secondary | ICD-10-CM | POA: Diagnosis not present

## 2020-10-26 DIAGNOSIS — Z955 Presence of coronary angioplasty implant and graft: Secondary | ICD-10-CM | POA: Diagnosis not present

## 2020-10-26 DIAGNOSIS — Z794 Long term (current) use of insulin: Secondary | ICD-10-CM | POA: Insufficient documentation

## 2020-10-26 DIAGNOSIS — Z7982 Long term (current) use of aspirin: Secondary | ICD-10-CM | POA: Insufficient documentation

## 2020-10-26 DIAGNOSIS — I10 Essential (primary) hypertension: Secondary | ICD-10-CM | POA: Insufficient documentation

## 2020-10-26 DIAGNOSIS — E1142 Type 2 diabetes mellitus with diabetic polyneuropathy: Secondary | ICD-10-CM | POA: Insufficient documentation

## 2020-10-26 DIAGNOSIS — K625 Hemorrhage of anus and rectum: Secondary | ICD-10-CM | POA: Diagnosis present

## 2020-10-26 LAB — BASIC METABOLIC PANEL
Anion gap: 8 (ref 5–15)
BUN: 9 mg/dL (ref 8–23)
CO2: 23 mmol/L (ref 22–32)
Calcium: 8.9 mg/dL (ref 8.9–10.3)
Chloride: 103 mmol/L (ref 98–111)
Creatinine, Ser: 0.78 mg/dL (ref 0.61–1.24)
GFR, Estimated: >60 mL/min (ref >60)
Glucose, Bld: 302 mg/dL — ABNORMAL HIGH (ref 70–99)
Potassium: 4.1 mmol/L (ref 3.5–5.1)
Sodium: 134 mmol/L — ABNORMAL LOW (ref 135–145)

## 2020-10-26 LAB — CBC WITH DIFFERENTIAL/PLATELET
Abs Immature Granulocytes: 0.02 10*3/uL (ref 0.00–0.07)
Basophils Absolute: 0.1 10*3/uL (ref 0.0–0.1)
Basophils Relative: 1 %
Eosinophils Absolute: 0.2 10*3/uL (ref 0.0–0.5)
Eosinophils Relative: 3 %
HCT: 46.6 % (ref 39.0–52.0)
Hemoglobin: 15.3 g/dL (ref 13.0–17.0)
Immature Granulocytes: 0 %
Lymphocytes Relative: 40 %
Lymphs Abs: 2.8 10*3/uL (ref 0.7–4.0)
MCH: 29 pg (ref 26.0–34.0)
MCHC: 32.8 g/dL (ref 30.0–36.0)
MCV: 88.3 fL (ref 80.0–100.0)
Monocytes Absolute: 0.5 10*3/uL (ref 0.1–1.0)
Monocytes Relative: 7 %
Neutro Abs: 3.3 10*3/uL (ref 1.7–7.7)
Neutrophils Relative %: 49 %
Platelets: 183 10*3/uL (ref 150–400)
RBC: 5.28 MIL/uL (ref 4.22–5.81)
RDW: 15.6 % — ABNORMAL HIGH (ref 11.5–15.5)
WBC: 6.9 10*3/uL (ref 4.0–10.5)
nRBC: 0 % (ref 0.0–0.2)

## 2020-10-26 MED ORDER — PRAMOXINE HCL (PERIANAL) 1 % EX FOAM: 1.0000 "application " | Freq: Three times a day (TID) | CUTANEOUS | 0 refills | Status: DC | PRN

## 2020-10-26 MED ORDER — HYDROCODONE-ACETAMINOPHEN 5-325 MG PO TABS
1.0000 | ORAL_TABLET | Freq: Once | ORAL | Status: AC
Start: 2020-10-26 — End: 2020-10-26
  Administered 2020-10-26: 1 via ORAL
  Filled 2020-10-26: qty 1

## 2020-10-26 MED ORDER — ACETAMINOPHEN 325 MG PO TABS: 650.0000 mg | ORAL_TABLET | Freq: Four times a day (QID) | ORAL | 0 refills | Status: DC | PRN

## 2020-10-26 NOTE — ED Triage Notes (Signed)
PT states pain to rectal area when wiping x 2 months.

## 2020-10-26 NOTE — ED Notes (Signed)
Patient verbalizes understanding of discharge instructions. Prescriptions and follow-up care reviewed. Opportunity for questioning and answers were provided. Armband removed by staff, pt discharged from ED via wheelchair. Pt calling for spouse to come pick him up.

## 2020-10-26 NOTE — ED Provider Notes (Signed)
[facility] EMERGENCY DEPARTMENT Provider Note   CSN: [id number] Arrival date & time: 10/26/20  1238     History No chief complaint on file.   Ricardo Howard is a 60 y.o. male.  60 year old male with history as below including diabetes, hypertension, chronic pancreatitis,, right lower extremity amputation presents to the ER secondary to rectal pain.  Patient reports pain to his rectum intermittently over the past 2 months.  It is associated with bowel movements, wiping.  He has noticed some scant blood on toilet paper after wiping.  Patient also has intermittent pain with defecation.  No blood thinners.  No abdominal pain, nausea or vomiting.  He is tolerant oral intake without difficulty.  No rectal trauma.  No rashes.  No fevers or chills.  No back pain.  He is ambulating at his baseline level with a cane, he has right lower extremity amputation  The history is provided by the patient. No language interpreter was used.  Rectal Bleeding Quality:  Bright red Amount:  Scant Duration:  2 months Timing:  Intermittent Context: defecation   Ineffective treatments:  Hemorrhoid cream and wipes Associated symptoms: no abdominal pain, no fever and no vomiting       Past Medical History:  Diagnosis Date   Alcohol abuse 06/14/2019   Diabetes mellitus without complication (HCC)    Essential hypertension 06/14/2019   Mixed hyperlipidemia due to type 2 diabetes mellitus (HCC) 07/31/2019   Nicotine dependence, cigarettes, uncomplicated 07/31/2019   Pancreatitis    Uncontrolled type 2 diabetes mellitus with diabetic polyneuropathy, with long-term current use of insulin (HCC) 07/31/2019    Patient Active Problem List   Diagnosis Date Noted   Tobacco abuse 07/18/2020   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   Pancreatitis 10/07/2019   Dehydration 10/07/2019   Hyperglycemia 10/07/2019   Occult blood in stools    Mixed hyperlipidemia due to type 2 diabetes mellitus  (HCC) 07/31/2019   Uncontrolled type 2 diabetes mellitus with diabetic polyneuropathy, with long-term current use of insulin (HCC) 07/31/2019   Nicotine dependence, cigarettes, uncomplicated 07/31/2019   Complaint of melena 07/31/2019   Pancreatic duct calculus 06/21/2019   Elevated LFTs 06/21/2019   DKA (diabetic ketoacidoses) 06/14/2019   Acute pancreatitis 06/14/2019   Alcohol abuse 06/14/2019   Essential hypertension 06/14/2019    Past Surgical History:  Procedure Laterality Date   AMPUTATION Right 03/13/2020   Procedure: TRANSMETATARSAL AMPUTATION;  Surgeon: Chuck Hint, MD;  Location: [facility] OR;  Service: Vascular;  Laterality: Right;   AMPUTATION Right 03/15/2020   Procedure: RIGHT BELOW KNEE AMPUTATION;  Surgeon: Chuck Hint, MD;  Location: [facility] OR;  Service: Vascular;  Laterality: Right;   CORONARY ANGIOPLASTY WITH STENT PLACEMENT         Family History  Problem Relation Age of Onset   Other Neg Hx     Social History   Tobacco Use   Smoking status: Every Day    Packs/day: 0.25    Types: Cigarettes   Smokeless tobacco: Never  Vaping Use   Vaping Use: Never used  Substance Use Topics   Alcohol use: Yes    Comment: etoh abuse   Drug use: Not Currently    Home Medications Prior to Admission medications   Medication Sig Start Date End Date Taking? Authorizing Provider  acetaminophen (TYLENOL) 325 MG tablet Take 2 tablets (650 mg total) by mouth every 6 (six) hours as needed. 10/26/20  Yes Sloan Leiter, DO  pramoxine (PROCTOFOAM) 1 % foam Place 1 application rectally 3 (three) times daily as needed for anal itching. 10/26/20  Yes Tanda Rockers A, DO  amLODipine (NORVASC) 5 MG tablet Take 5 mg by mouth daily. 04/25/20   [provider]  aspirin 81 MG tablet Take 81 mg by mouth daily.    [provider]  atorvastatin (LIPITOR) 80 MG tablet Take 80 mg by mouth daily.    [provider]  buPROPion (WELLBUTRIN SR) 150 MG 12 hr  tablet Take 150 mg by mouth in the morning and at bedtime.    [provider]  carvedilol (COREG) 25 MG tablet Take 25 mg by mouth 2 (two) times daily with a meal. 01/20/20   [provider]  folic acid (FOLVITE) 1 MG tablet Take 1 tablet (1 mg total) by mouth daily. 10/11/19   Rodolph Bong, MD  gabapentin (NEURONTIN) 300 MG capsule Take 300 mg by mouth in the morning and at bedtime.    [provider]  hydrOXYzine (VISTARIL) 25 MG capsule Take 25 mg by mouth at bedtime as needed for anxiety. 01/07/20   [provider]  insulin aspart (NOVOLOG) 100 UNIT/ML FlexPen Inject 4 Units into the skin 3 (three) times daily with meals. Patient taking differently: Inject 0-5 Units into the skin See admin instructions. Per sliding scale with meals 10/10/19   Rodolph Bong, MD  insulin glargine (LANTUS) 100 UNIT/ML Solostar Pen Inject 16 Units into the skin daily. 10/10/19   Rodolph Bong, MD  Insulin Pen Needle (NOVOFINE) 30G X 8 MM MISC Inject 10 each into the skin as needed. 10/10/19   Rodolph Bong, MD  isosorbide mononitrate (IMDUR) 30 MG 24 hr tablet Take 30 mg by mouth daily. 01/20/20   [provider]  lipase/protease/amylase (CREON) 36000 UNITS CPEP capsule Take 1 capsule (36,000 Units total) by mouth 3 (three) times daily before meals. 06/17/19   Laverna Peace, MD  losartan (COZAAR) 50 MG tablet Take 25 mg by mouth daily. 04/25/20   [provider]  magnesium oxide (MAG-OX) 400 (241.3 Mg) MG tablet Take 1 tablet (400 mg total) by mouth 2 (two) times daily. 03/18/20   Burnadette Pop, MD  metFORMIN (GLUCOPHAGE) 500 MG tablet Take 500 mg by mouth daily with breakfast.    [provider]  naproxen sodium (ALEVE) 220 MG tablet Take 440 mg by mouth 2 (two) times daily as needed (headache).    [provider]  nicotine (NICODERM CQ - DOSED IN MG/24 HOURS) 14 mg/24hr patch Place 14 mg onto the skin daily. 04/07/20   [provider]  nicotine (NICODERM CQ - DOSED IN MG/24 HR) 7 mg/24hr patch Place 7 mg onto the skin daily. 04/07/20   [provider]  polyethylene glycol (MIRALAX / GLYCOLAX) 17 g packet Take 17 g by mouth daily as needed for mild constipation. 03/18/20   Burnadette Pop, MD  sertraline (ZOLOFT) 50 MG tablet Take 100 mg by mouth daily. 03/31/20   [provider]  tamsulosin (FLOMAX) 0.4 MG CAPS capsule Take 0.4 mg by mouth daily. 01/20/20   [provider]  thiamine 100 MG tablet Take 1 tablet (100 mg total) by mouth daily. 10/11/19   Rodolph Bong, MD  vitamin B-12 (CYANOCOBALAMIN) 500 MCG tablet Take 500 mcg by mouth daily. 01/20/20   [provider]  zolpidem (AMBIEN) 10 MG tablet Take 10 mg by mouth at bedtime as needed for sleep.  [provider]    Allergies    Iohexol, Lac bovis, Lisinopril, and Simvastatin  Review of Systems   Review of Systems  Constitutional:  Negative for chills and fever.  HENT:  Negative for facial swelling and trouble swallowing.   Eyes:  Negative for photophobia and visual disturbance.  Respiratory:  Negative for cough and shortness of breath.   Cardiovascular:  Negative for chest pain and palpitations.  Gastrointestinal:  Positive for anal bleeding, hematochezia and rectal pain. Negative for abdominal pain, nausea and vomiting.  Endocrine: Negative for polydipsia and polyuria.  Genitourinary:  Negative for difficulty urinating and hematuria.  Musculoskeletal:  Negative for gait problem and joint swelling.  Skin:  Negative for pallor and rash.  Neurological:  Negative for syncope and headaches.  Psychiatric/Behavioral:  Negative for agitation and confusion.    Physical Exam Updated Vital Signs BP (!) 157/94   Pulse 91   Temp 97.8 F (36.6 C) (Oral)   Resp 16   Ht  (1.727 m)   Wt 113.4 kg   SpO2 97%   BMI 38.01 kg/m   Physical Exam Vitals and nursing note reviewed. Exam conducted with a  chaperone present.  Constitutional:      General: He is not in acute distress.    Appearance: He is well-developed.  HENT:     Head: Normocephalic and atraumatic.     Right Ear: External ear normal.     Left Ear: External ear normal.     Mouth/Throat:     Mouth: Mucous membranes are moist.  Eyes:     General: No scleral icterus. Cardiovascular:     Rate and Rhythm: Normal rate and regular rhythm.     Pulses: Normal pulses.     Heart sounds: Normal heart sounds.  Pulmonary:     Effort: Pulmonary effort is normal. No respiratory distress.     Breath sounds: Normal breath sounds.  Abdominal:     General: Abdomen is flat.     Palpations: Abdomen is soft.     Tenderness: There is no abdominal tenderness.  Genitourinary:    Comments: Stool noted, nonmelanotic.  No brisk bleeding.  No fissures.  External hemorrhoid appreciated.  Not appear to be thrombosed. Musculoskeletal:        General: Normal range of motion.     Cervical back: Normal range of motion.     Right lower leg: No edema.     Left lower leg: No edema.       Legs:     Comments: Right BKA  Skin:    General: Skin is warm and dry.     Capillary Refill: Capillary refill takes less than 2 seconds.  Neurological:     Mental Status: He is alert and oriented to person, place, and time.     GCS: GCS eye subscore is 4. GCS verbal subscore is 5. GCS motor subscore is 6.  Psychiatric:        Mood and Affect: Mood normal.        Behavior: Behavior normal.    ED Results / Procedures / Treatments   Labs (all labs ordered are listed, but only abnormal results are displayed) Labs Reviewed  CBC WITH DIFFERENTIAL/PLATELET - Abnormal; Notable for the following components:      Result Value   RDW 15.6 (*)    All other components within normal limits  BASIC METABOLIC PANEL - Abnormal; Notable for the following components:   Sodium 134 (*)  Glucose, Bld 302 (*)    All other components within normal limits     EKG None  Radiology CT ABDOMEN PELVIS WO CONTRAST  Result Date: 10/26/2020 CLINICAL DATA:  Acute abdominal pain. Evaluate for rectal versus. No abscess. EXAM: CT ABDOMEN AND PELVIS WITHOUT CONTRAST TECHNIQUE: Multidetector CT imaging of the abdomen and pelvis was performed following the standard protocol without IV contrast. COMPARISON:  CT abdomen and pelvis 10/07/2019 FINDINGS: Lower chest: No acute abnormality. Hepatobiliary: No focal liver abnormality is seen. No gallstones, gallbladder wall thickening, or biliary dilatation. Pancreas: Pancreatic head calcifications are again noted compatible with chronic pancreatitis. There are no acute inflammatory changes. Spleen: Normal in size without focal abnormality. Adrenals/Urinary Tract: Nonspecific bilateral adrenal thickening is unchanged. There is no evidence for urinary tract calculus or hydronephrosis. Kidneys and bladder are within normal limits. Stomach/Bowel: Stomach is within normal limits. Appendix appears normal. No evidence of bowel wall thickening, distention, or inflammatory changes. There is no evidence for perianal or perirectal fluid collection allowing for lack of contrast. Vascular/Lymphatic: Aortic atherosclerosis. No enlarged abdominal or pelvic lymph nodes. Reproductive: Prostate is unremarkable. Other: No abdominal wall hernia or abnormality. No abdominopelvic ascites. Musculoskeletal: Choose 1 IMPRESSION: 1. No acute localizing process in the abdomen or pelvis. No evidence for perirectal or perianal abscess. 2. Findings compatible with chronic pancreatitis. 3. Aortic Atherosclerosis (ICD10-I70.0). Electronically Signed   By: Darliss Cheney M.D.   On: 10/26/2020 22:31    Procedures Procedures   Medications Ordered in ED Medications  HYDROcodone-acetaminophen (NORCO/VICODIN) 5-325 MG per tablet 1 tablet (1 tablet Oral Given 10/26/20 2252)    ED Course  I have reviewed the triage vital signs and the nursing notes.  Pertinent  labs & imaging results that were available during my care of the patient were reviewed by me and considered in my medical decision making (see chart for details).    MDM Rules/Calculators/A&P                           This patient complains of blood per rectum, anal pain; this involves an extensive number of treatment options and is a complaint that carries with it a high risk of complications and Morbidity.  Vital signs reviewed and are stable serious etiologies considered.  Patient with pain with defecation, rectal pain, scant blood when wiping.  His exam is concerning for likely external hemorrhoid.  Patient significant comorbidities, ongoing pain.  Will obtain CT abdomen pelvis without IV contrast to rule out possible perianal or perirectal abscess.  No evidence of these findings on CT.  Labs reviewed and are stable.  At this time favor external hemorrhoid as etiology of patient's symptoms.  Discussed supportive care including topical anesthetic, sitz bath, dietary changes, close outpatient follow-up with general surgery.    The patient improved significantly and was discharged in stable condition. Detailed discussions were had with the patient regarding current findings, and need for close f/u with PCP or on call doctor. The patient has been instructed to return immediately if the symptoms worsen in any way for re-evaluation. Patient verbalized understanding and is in agreement with current care plan. All questions answered prior to discharge.     Final Clinical Impression(s) / ED Diagnoses Final diagnoses:  External hemorrhoid    Rx / DC Orders ED Discharge Orders          Ordered    pramoxine (PROCTOFOAM) 1 % foam  3 times daily PRN  10/26/20 2240    acetaminophen (TYLENOL) 325 MG tablet  Every 6 hours PRN        10/26/20 2248             Tanda Rockers A, DO 10/27/20 0003

## 2020-10-26 NOTE — ED Provider Notes (Signed)
Emergency Medicine Provider Triage Evaluation Note  Jaycub Noorani , a 60 y.o. male  was evaluated in triage.  Pt complains of a rash to the buttocks for the last 2 months.  Patient is diabetic.  It has been intermittent since onset and painful.  Review of Systems  Positive:  Negative: Fever, chills, abdominal pain, diarrhea   Physical Exam  BP 119/80 (BP Location: Left Arm)   Pulse 85   Temp 97.7 F (36.5 C)   Resp 15   SpO2 97%  Gen:   Awake, no distress   Resp:  Normal effort  MSK:   Moves extremities without difficulty  Other:  No obvious breakdown within the gluteal folds.  There is a brownish material towards the rectum.  It was difficult to assess further secondary to pain.  Medical Decision Making  Medically screening exam initiated at 3:17 PM.  Appropriate orders placed.  [name] was informed that the remainder of the evaluation will be completed by another provider, this initial triage assessment does not replace that evaluation, and the importance of remaining in the ED until their evaluation is complete.     Honor Loh Waldron, PA-C 10/26/20 1519    Gerhard Munch, MD 10/26/20 [phone]

## 2020-11-25 ENCOUNTER — Other Ambulatory Visit: Payer: Self-pay

## 2020-11-25 DIAGNOSIS — I739 Peripheral vascular disease, unspecified: Secondary | ICD-10-CM

## 2020-11-28 ENCOUNTER — Other Ambulatory Visit: Payer: Self-pay

## 2020-11-28 ENCOUNTER — Ambulatory Visit (HOSPITAL_COMMUNITY)
Admission: RE | Admit: 2020-11-28 | Discharge: 2020-11-28 | Disposition: A | Discharge status: Home / Self Care | Payer: No Typology Code available for payment source | Source: Ambulatory Visit | Attending: Surgery | Admitting: Surgery

## 2020-11-28 ENCOUNTER — Ambulatory Visit (INDEPENDENT_AMBULATORY_CARE_PROVIDER_SITE_OTHER): Payer: No Typology Code available for payment source | Admitting: Physician Assistant

## 2020-11-28 VITALS — BP 101/70 | HR 90 | Temp 98.0°F | Resp 20 | Ht 68.0 in | Wt 250.0 lb

## 2020-11-28 DIAGNOSIS — I739 Peripheral vascular disease, unspecified: Secondary | ICD-10-CM

## 2020-11-28 DIAGNOSIS — Z89511 Acquired absence of right leg below knee: Secondary | ICD-10-CM | POA: Diagnosis not present

## 2020-11-28 NOTE — Progress Notes (Signed)
Office Note     CC:  follow up Requesting Provider:  [facility]  HPI: Ricardo Howard is a 60 y.o. ([birth year]) male who presents for follow up of peripheral artery disease. He is s/p Right BKA by Dr. Edilia Bo on 03/15/20. This was performed secondary to gangrene in right foot. He had underwent right TMA that did not heal. He was last seen in March and was doing well at the time. His BKA was healing well and was referred to biotech for prosthesis.  He now presents today with dry gangrene of left foot. He was sent from [name]. He explains that he developed ulcer on his left great toe approximately 2 weeks ago and then noticed wounds on left 3rd toe medial and lateral. [facility] home [facility] came yesterday and applied some gauze between his toes and Promogran. His wife otherwise has been cleaning the wounds prior to [facility] coming. The patient reports not pain in his foot or leg. He does have neuropathy so he has some diminished sensation in left foot. He also reports some tiredness in left leg when using his RLE prosthesis. He otherwise reports that RLE BKA is doing well and he is ambulating with prosthesis without any assistive devices  The pt is on a statin for cholesterol management.  The pt is on a daily aspirin.   Other AC:  none The pt is on ARB, CCB, BB for hypertension.   The pt is diabetic.   Tobacco hx:  current, 1/4 ppd  Past Medical History:  Diagnosis Date   Alcohol abuse 06/14/2019   Diabetes mellitus without complication (HCC)    Essential hypertension 06/14/2019   Mixed hyperlipidemia due to type 2 diabetes mellitus (HCC) 07/31/2019   Nicotine dependence, cigarettes, uncomplicated 07/31/2019   Pancreatitis    Uncontrolled type 2 diabetes mellitus with diabetic polyneuropathy, with long-term current use of insulin 07/31/2019    Past Surgical History:  Procedure Laterality Date   AMPUTATION Right 03/13/2020   Procedure: TRANSMETATARSAL AMPUTATION;  Surgeon: Chuck Hint, MD;  Location: [facility] OR;  Service: Vascular;  Laterality: Right;   AMPUTATION Right 03/15/2020   Procedure: RIGHT BELOW KNEE AMPUTATION;  Surgeon: Chuck Hint, MD;  Location: [facility] OR;  Service: Vascular;  Laterality: Right;   CORONARY ANGIOPLASTY WITH STENT PLACEMENT      Social History   Socioeconomic History   Marital status: Married    Spouse name: Not on file   Number of children: Not on file   Years of education: Not on file   Highest education level: Not on file  Occupational History   Not on file  Tobacco Use   Smoking status: Every Day    Packs/day: 0.25    Types: Cigarettes   Smokeless tobacco: Never  Vaping Use   Vaping Use: Never used  Substance and Sexual Activity   Alcohol use: Yes    Comment: etoh abuse   Drug use: Not Currently   Sexual activity: Not on file  Other Topics Concern   Not on file  Social History Narrative   Not on file   Social Determinants of Health   Financial Resource Strain: Not on file  Food Insecurity: Not on file  Transportation Needs: Not on file  Physical Activity: Not on file  Stress: Not on file  Social Connections: Not on file  Intimate Partner Violence: Not on file    Family History  Problem Relation Age of Onset   Other Neg Hx  Current Outpatient Medications  Medication Sig Dispense Refill   acetaminophen (TYLENOL) 325 MG tablet Take 2 tablets (650 mg total) by mouth every 6 (six) hours as needed. 36 tablet 0   amLODipine (NORVASC) 5 MG tablet Take 5 mg by mouth daily.     aspirin 81 MG tablet Take 81 mg by mouth daily.     atorvastatin (LIPITOR) 80 MG tablet Take 80 mg by mouth daily.     buPROPion (WELLBUTRIN SR) 150 MG 12 hr tablet Take 150 mg by mouth in the morning and at bedtime.     carvedilol (COREG) 25 MG tablet Take 25 mg by mouth 2 (two) times daily with a meal.     folic acid (FOLVITE) 1 MG tablet Take 1 tablet (1 mg total) by mouth daily.     gabapentin (NEURONTIN) 300 MG capsule  Take 300 mg by mouth in the morning and at bedtime.     hydrOXYzine (VISTARIL) 25 MG capsule Take 25 mg by mouth at bedtime as needed for anxiety.     insulin aspart (NOVOLOG) 100 UNIT/ML FlexPen Inject 4 Units into the skin 3 (three) times daily with meals. (Patient taking differently: Inject 0-5 Units into the skin See admin instructions. Per sliding scale with meals) 15 mL 0   insulin glargine (LANTUS) 100 UNIT/ML Solostar Pen Inject 16 Units into the skin daily. 15 mL 0   Insulin Pen Needle (NOVOFINE) 30G X 8 MM MISC Inject 10 each into the skin as needed. 100 each 0   isosorbide mononitrate (IMDUR) 30 MG 24 hr tablet Take 30 mg by mouth daily.     lipase/protease/amylase (CREON) 36000 UNITS CPEP capsule Take 1 capsule (36,000 Units total) by mouth 3 (three) times daily before meals. 270 capsule 0   losartan (COZAAR) 50 MG tablet Take 25 mg by mouth daily.     magnesium oxide (MAG-OX) 400 (241.3 Mg) MG tablet Take 1 tablet (400 mg total) by mouth 2 (two) times daily.     metFORMIN (GLUCOPHAGE) 500 MG tablet Take 500 mg by mouth daily with breakfast.     naproxen sodium (ALEVE) 220 MG tablet Take 440 mg by mouth 2 (two) times daily as needed (headache).     nicotine (NICODERM CQ - DOSED IN MG/24 HOURS) 14 mg/24hr patch Place 14 mg onto the skin daily.     nicotine (NICODERM CQ - DOSED IN MG/24 HR) 7 mg/24hr patch Place 7 mg onto the skin daily.     polyethylene glycol (MIRALAX / GLYCOLAX) 17 g packet Take 17 g by mouth daily as needed for mild constipation. 14 each 0   pramoxine (PROCTOFOAM) 1 % foam Place 1 application rectally 3 (three) times daily as needed for anal itching. 15 g 0   sertraline (ZOLOFT) 50 MG tablet Take 100 mg by mouth daily.     tamsulosin (FLOMAX) 0.4 MG CAPS capsule Take 0.4 mg by mouth daily.     thiamine 100 MG tablet Take 1 tablet (100 mg total) by mouth daily.     vitamin B-12 (CYANOCOBALAMIN) 500 MCG tablet Take 500 mcg by mouth daily.     zolpidem (AMBIEN) 10 MG  tablet Take 10 mg by mouth at bedtime as needed for sleep.     No current facility-administered medications for this visit.    Allergies  Allergen Reactions   Iohexol Hives    Patient broke out in hives after injection of Omni 300, will need 13 hour pre-med in future  Semaglutide     Other reaction(s): Other Pancreatitis   Lac Bovis Diarrhea    Other reaction(s): Finding of gastrointestinal tract gas, Diarrhea   Lisinopril Cough   Simvastatin Itching and Nausea Only     REVIEW OF SYSTEMS:  [X]  denotes positive finding, [ ]  denotes negative finding Cardiac  Comments:  Chest pain or chest pressure:    Shortness of breath upon exertion:    Short of breath when lying flat:    Irregular heart rhythm:        Vascular    Pain in calf, thigh, or hip brought on by ambulation:    Pain in feet at night that wakes you up from your sleep:     Blood clot in your veins:    Leg swelling:         Pulmonary    Oxygen at home:    Productive cough:     Wheezing:         Neurologic    Sudden weakness in arms or legs:     Sudden numbness in arms or legs:     Sudden onset of difficulty speaking or slurred speech:    Temporary loss of vision in one eye:     Problems with dizziness:         Gastrointestinal    Blood in stool:     Vomited blood:         Genitourinary    Burning when urinating:     Blood in urine:        Psychiatric    Major depression:         Hematologic    Bleeding problems:    Problems with blood clotting too easily:        Skin    Rashes or ulcers:        Constitutional    Fever or chills:      PHYSICAL EXAMINATION:  Vitals:   11/28/20 1532  BP: 101/70  Pulse: 90  Resp: 20  Temp: 98 F (36.7 C)  SpO2: 95%  Weight: 250 lb (113.4 kg)  Height: 5\' 8"  (1.727 m)    General:  WDWN in NAD; vital signs documented above Gait: Normal, ambulates with right BKA prosthesis HENT: WNL, normocephalic Pulmonary: normal non-labored breathing Cardiac:  regular HR, without  Murmurs  Abdomen: soft, NT, no masses Vascular Exam/Pulses:  Right Left  Radial 2+ (normal) 2+ (normal)  Femoral 2+ (normal) 2+ (normal)  Popliteal Right BKA Not palpable  DP Right BKA absent  PT R BKA absent   Extremities: with ischemic changes, with Gangrene , without cellulitis; with open wounds of medial left 1st toe, medial and lateral left 3rd toes. Wounds cleaned with wound cleaner, Promogran and dry gauze placed between toes, wrapped with gauze dressing.   Musculoskeletal: no muscle wasting or atrophy  Neurologic: A&O X 3;  No focal weakness or paresthesias are detected Psychiatric:  The pt has Normal affect.   Non-Invasive Vascular Imaging:   +-------+-----------+-----------+------------+------------+  ABI/TBIToday's ABIToday's TBIPrevious ABIPrevious TBI  +-------+-----------+-----------+------------+------------+  Right  BKA                                             +-------+-----------+-----------+------------+------------+  Left   0.7        0.31       0.63  not done      +-------+-----------+-----------+------------+------------+  Dampened monophasic flow in left PT/ DP  ASSESSMENT/PLAN:: 60 y.o. male here for follow up for peripheral artery disease. His right BKA has healed well and is ambulating well with prosthesis. He now has ulcerations on left 1st and 3rd toes. Continue daily cleaning of wounds and keep gauze between toes. [facility] to continue 2x/ week dressing changes with Promogran. His ABI's today are essentially unchanged from prior study but with new wounds recommend Angiography. Discussed risks and benefits. Dr. Edilia Bo has previously done his procedures on left leg however he is out of town this week. I discussed arranging Arteriogram with Dr. Edilia Bo when he returns next week vs scheduling sooner with one of his partners. They would like to have it done at next earliest availability -Will schedule him for  Angiogram this week at next available OR time   Graceann Congress, PA-C [facility] [phone]  Clinic MD:   Myra Gianotti

## 2020-11-28 NOTE — H&P (View-Only) (Signed)
Office Note     CC:  follow up Requesting Provider:  Clinic, [facility]  HPI: Ricardo Howard is a 60 y.o. (05/13/1960) male who presents for follow up of peripheral artery disease. He is s/p Right BKA by Dr. Dickson on 03/15/20. This was performed secondary to gangrene in right foot. He had underwent right TMA that did not heal. He was last seen in March and was doing well at the time. His BKA was healing well and was referred to biotech for prosthesis.  He now presents today with dry gangrene of left foot. [facility] He explains that he developed ulcer on his left great toe approximately 2 weeks ago and then noticed wounds on left 3rd toe medial and lateral. [facility] came yesterday and applied some gauze between his toes and Promogran. His wife otherwise has been cleaning the wounds prior to [facility] coming. The patient reports not pain in his foot or leg. He does have neuropathy so he has some diminished sensation in left foot. He also reports some tiredness in left leg when using his RLE prosthesis. He otherwise reports that RLE BKA is doing well and he is ambulating with prosthesis without any assistive devices  The pt is on a statin for cholesterol management.  The pt is on a daily aspirin.   Other AC:  none The pt is on ARB, CCB, BB for hypertension.   The pt is diabetic.   Tobacco hx:  current, 1/4 ppd  Past Medical History:  Diagnosis Date   Alcohol abuse 06/14/2019   Diabetes mellitus without complication (HCC)    Essential hypertension 06/14/2019   Mixed hyperlipidemia due to type 2 diabetes mellitus (HCC) 07/31/2019   Nicotine dependence, cigarettes, uncomplicated 07/31/2019   Pancreatitis    Uncontrolled type 2 diabetes mellitus with diabetic polyneuropathy, with long-term current use of insulin 07/31/2019    Past Surgical History:  Procedure Laterality Date   AMPUTATION Right 03/13/2020   Procedure: TRANSMETATARSAL AMPUTATION;  Surgeon: Dickson,  Christopher S, MD;  Location: [facility] OR;  Service: Vascular;  Laterality: Right;   AMPUTATION Right 03/15/2020   Procedure: RIGHT BELOW KNEE AMPUTATION;  Surgeon: Dickson, Christopher S, MD;  Location: [facility] OR;  Service: Vascular;  Laterality: Right;   CORONARY ANGIOPLASTY WITH STENT PLACEMENT      Social History   Socioeconomic History   Marital status: Married    Spouse name: Not on file   Number of children: Not on file   Years of education: Not on file   Highest education level: Not on file  Occupational History   Not on file  Tobacco Use   Smoking status: Every Day    Packs/day: 0.25    Types: Cigarettes   Smokeless tobacco: Never  Vaping Use   Vaping Use: Never used  Substance and Sexual Activity   Alcohol use: Yes    Comment: etoh abuse   Drug use: Not Currently   Sexual activity: Not on file  Other Topics Concern   Not on file  Social History Narrative   Not on file   Social Determinants of Health   Financial Resource Strain: Not on file  Food Insecurity: Not on file  Transportation Needs: Not on file  Physical Activity: Not on file  Stress: Not on file  Social Connections: Not on file  Intimate Partner Violence: Not on file    Family History  Problem Relation Age of Onset   Other Neg Hx       Current Outpatient Medications  Medication Sig Dispense Refill   acetaminophen (TYLENOL) 325 MG tablet Take 2 tablets (650 mg total) by mouth every 6 (six) hours as needed. 36 tablet 0   amLODipine (NORVASC) 5 MG tablet Take 5 mg by mouth daily.     aspirin 81 MG tablet Take 81 mg by mouth daily.     atorvastatin (LIPITOR) 80 MG tablet Take 80 mg by mouth daily.     buPROPion (WELLBUTRIN SR) 150 MG 12 hr tablet Take 150 mg by mouth in the morning and at bedtime.     carvedilol (COREG) 25 MG tablet Take 25 mg by mouth 2 (two) times daily with a meal.     folic acid (FOLVITE) 1 MG tablet Take 1 tablet (1 mg total) by mouth daily.     gabapentin (NEURONTIN) 300 MG capsule  Take 300 mg by mouth in the morning and at bedtime.     hydrOXYzine (VISTARIL) 25 MG capsule Take 25 mg by mouth at bedtime as needed for anxiety.     insulin aspart (NOVOLOG) 100 UNIT/ML FlexPen Inject 4 Units into the skin 3 (three) times daily with meals. (Patient taking differently: Inject 0-5 Units into the skin See admin instructions. Per sliding scale with meals) 15 mL 0   insulin glargine (LANTUS) 100 UNIT/ML Solostar Pen Inject 16 Units into the skin daily. 15 mL 0   Insulin Pen Needle (NOVOFINE) 30G X 8 MM MISC Inject 10 each into the skin as needed. 100 each 0   isosorbide mononitrate (IMDUR) 30 MG 24 hr tablet Take 30 mg by mouth daily.     lipase/protease/amylase (CREON) 36000 UNITS CPEP capsule Take 1 capsule (36,000 Units total) by mouth 3 (three) times daily before meals. 270 capsule 0   losartan (COZAAR) 50 MG tablet Take 25 mg by mouth daily.     magnesium oxide (MAG-OX) 400 (241.3 Mg) MG tablet Take 1 tablet (400 mg total) by mouth 2 (two) times daily.     metFORMIN (GLUCOPHAGE) 500 MG tablet Take 500 mg by mouth daily with breakfast.     naproxen sodium (ALEVE) 220 MG tablet Take 440 mg by mouth 2 (two) times daily as needed (headache).     nicotine (NICODERM CQ - DOSED IN MG/24 HOURS) 14 mg/24hr patch Place 14 mg onto the skin daily.     nicotine (NICODERM CQ - DOSED IN MG/24 HR) 7 mg/24hr patch Place 7 mg onto the skin daily.     polyethylene glycol (MIRALAX / GLYCOLAX) 17 g packet Take 17 g by mouth daily as needed for mild constipation. 14 each 0   pramoxine (PROCTOFOAM) 1 % foam Place 1 application rectally 3 (three) times daily as needed for anal itching. 15 g 0   sertraline (ZOLOFT) 50 MG tablet Take 100 mg by mouth daily.     tamsulosin (FLOMAX) 0.4 MG CAPS capsule Take 0.4 mg by mouth daily.     thiamine 100 MG tablet Take 1 tablet (100 mg total) by mouth daily.     vitamin B-12 (CYANOCOBALAMIN) 500 MCG tablet Take 500 mcg by mouth daily.     zolpidem (AMBIEN) 10 MG  tablet Take 10 mg by mouth at bedtime as needed for sleep.     No current facility-administered medications for this visit.    Allergies  Allergen Reactions   Iohexol Hives    Patient broke out in hives after injection of Omni 300, will need 13 hour pre-med in future  Semaglutide     Other reaction(s): Other Pancreatitis   Lac Bovis Diarrhea    Other reaction(s): Finding of gastrointestinal tract gas, Diarrhea   Lisinopril Cough   Simvastatin Itching and Nausea Only     REVIEW OF SYSTEMS:  [X]  denotes positive finding, [ ]  denotes negative finding Cardiac  Comments:  Chest pain or chest pressure:    Shortness of breath upon exertion:    Short of breath when lying flat:    Irregular heart rhythm:        Vascular    Pain in calf, thigh, or hip brought on by ambulation:    Pain in feet at night that wakes you up from your sleep:     Blood clot in your veins:    Leg swelling:         Pulmonary    Oxygen at home:    Productive cough:     Wheezing:         Neurologic    Sudden weakness in arms or legs:     Sudden numbness in arms or legs:     Sudden onset of difficulty speaking or slurred speech:    Temporary loss of vision in one eye:     Problems with dizziness:         Gastrointestinal    Blood in stool:     Vomited blood:         Genitourinary    Burning when urinating:     Blood in urine:        Psychiatric    Major depression:         Hematologic    Bleeding problems:    Problems with blood clotting too easily:        Skin    Rashes or ulcers:        Constitutional    Fever or chills:      PHYSICAL EXAMINATION:  Vitals:   11/28/20 1532  BP: 101/70  Pulse: 90  Resp: 20  Temp: 98 F (36.7 C)  SpO2: 95%  Weight: 250 lb (113.4 kg)  Height: 5\' 8"  (1.727 m)    General:  WDWN in NAD; vital signs documented above Gait: Normal, ambulates with right BKA prosthesis HENT: WNL, normocephalic Pulmonary: normal non-labored breathing Cardiac:  regular HR, without  Murmurs  Abdomen: soft, NT, no masses Vascular Exam/Pulses:  Right Left  Radial 2+ (normal) 2+ (normal)  Femoral 2+ (normal) 2+ (normal)  Popliteal Right BKA Not palpable  DP Right BKA absent  PT R BKA absent   Extremities: with ischemic changes, with Gangrene , without cellulitis; with open wounds of medial left 1st toe, medial and lateral left 3rd toes. Wounds cleaned with wound cleaner, Promogran and dry gauze placed between toes, wrapped with gauze dressing.   Musculoskeletal: no muscle wasting or atrophy  Neurologic: A&O X 3;  No focal weakness or paresthesias are detected Psychiatric:  The pt has Normal affect.   Non-Invasive Vascular Imaging:   +-------+-----------+-----------+------------+------------+  ABI/TBIToday's ABIToday's TBIPrevious ABIPrevious TBI  +-------+-----------+-----------+------------+------------+  Right  BKA                                             +-------+-----------+-----------+------------+------------+  Left   0.7        0.31       0.63  not done      +-------+-----------+-----------+------------+------------+  Dampened monophasic flow in left PT/ DP  ASSESSMENT/PLAN:: 60 y.o. male here for follow up for peripheral artery disease. His right BKA has healed well and is ambulating well with prosthesis. He now has ulcerations on left 1st and 3rd toes. Continue daily cleaning of wounds and keep gauze between toes. [facility] to continue 2x/ week dressing changes with Promogran. His ABI's today are essentially unchanged from prior study but with new wounds recommend Angiography. Discussed risks and benefits. Dr. Edilia Bo has previously done his procedures on left leg however he is out of town this week. I discussed arranging Arteriogram with Dr. Edilia Bo when he returns next week vs scheduling sooner with one of his partners. They would like to have it done at next earliest availability -Will schedule him for  Angiogram this week at next available OR time   Graceann Congress, PA-C [facility] [phone]  Clinic MD:   Myra Gianotti

## 2020-12-02 ENCOUNTER — Encounter (HOSPITAL_COMMUNITY)
Admission: RE | Disposition: A | Discharge status: Home / Self Care | Payer: Self-pay | Source: Home / Self Care | Attending: Vascular Surgery

## 2020-12-02 ENCOUNTER — Other Ambulatory Visit (HOSPITAL_COMMUNITY): Payer: Self-pay

## 2020-12-02 ENCOUNTER — Other Ambulatory Visit: Payer: Self-pay

## 2020-12-02 ENCOUNTER — Ambulatory Visit (HOSPITAL_COMMUNITY)
Admission: RE | Admit: 2020-12-02 | Discharge: 2020-12-02 | Disposition: A | Discharge status: Home / Self Care | Payer: No Typology Code available for payment source | Attending: Vascular Surgery | Admitting: Vascular Surgery

## 2020-12-02 DIAGNOSIS — I10 Essential (primary) hypertension: Secondary | ICD-10-CM | POA: Diagnosis not present

## 2020-12-02 DIAGNOSIS — Z89511 Acquired absence of right leg below knee: Secondary | ICD-10-CM | POA: Diagnosis not present

## 2020-12-02 DIAGNOSIS — L97529 Non-pressure chronic ulcer of other part of left foot with unspecified severity: Secondary | ICD-10-CM

## 2020-12-02 DIAGNOSIS — Z79899 Other long term (current) drug therapy: Secondary | ICD-10-CM | POA: Diagnosis not present

## 2020-12-02 DIAGNOSIS — I70245 Atherosclerosis of native arteries of left leg with ulceration of other part of foot: Secondary | ICD-10-CM

## 2020-12-02 DIAGNOSIS — Z7984 Long term (current) use of oral hypoglycemic drugs: Secondary | ICD-10-CM | POA: Insufficient documentation

## 2020-12-02 DIAGNOSIS — Z794 Long term (current) use of insulin: Secondary | ICD-10-CM | POA: Diagnosis not present

## 2020-12-02 DIAGNOSIS — E11621 Type 2 diabetes mellitus with foot ulcer: Secondary | ICD-10-CM | POA: Insufficient documentation

## 2020-12-02 DIAGNOSIS — Z888 Allergy status to other drugs, medicaments and biological substances status: Secondary | ICD-10-CM | POA: Insufficient documentation

## 2020-12-02 DIAGNOSIS — F17210 Nicotine dependence, cigarettes, uncomplicated: Secondary | ICD-10-CM | POA: Insufficient documentation

## 2020-12-02 DIAGNOSIS — Z7982 Long term (current) use of aspirin: Secondary | ICD-10-CM | POA: Diagnosis not present

## 2020-12-02 DIAGNOSIS — I70262 Atherosclerosis of native arteries of extremities with gangrene, left leg: Secondary | ICD-10-CM | POA: Insufficient documentation

## 2020-12-02 DIAGNOSIS — I739 Peripheral vascular disease, unspecified: Secondary | ICD-10-CM

## 2020-12-02 DIAGNOSIS — E1152 Type 2 diabetes mellitus with diabetic peripheral angiopathy with gangrene: Secondary | ICD-10-CM | POA: Insufficient documentation

## 2020-12-02 DIAGNOSIS — I70201 Unspecified atherosclerosis of native arteries of extremities, right leg: Secondary | ICD-10-CM

## 2020-12-02 DIAGNOSIS — M7989 Other specified soft tissue disorders: Secondary | ICD-10-CM

## 2020-12-02 HISTORY — PX: PERIPHERAL VASCULAR INTERVENTION: CATH118257

## 2020-12-02 HISTORY — PX: ABDOMINAL AORTOGRAM W/LOWER EXTREMITY: CATH118223

## 2020-12-02 LAB — POCT I-STAT, CHEM 8
BUN: 11 mg/dL (ref 8–23)
Calcium, Ion: 1.14 mmol/L — ABNORMAL LOW (ref 1.15–1.40)
Chloride: 104 mmol/L (ref 98–111)
Creatinine, Ser: 0.6 mg/dL — ABNORMAL LOW (ref 0.61–1.24)
Glucose, Bld: 274 mg/dL — ABNORMAL HIGH (ref 70–99)
HCT: 47 % (ref 39.0–52.0)
Hemoglobin: 16 g/dL (ref 13.0–17.0)
Potassium: 4.6 mmol/L (ref 3.5–5.1)
Sodium: 138 mmol/L (ref 135–145)
TCO2: 24 mmol/L (ref 22–32)

## 2020-12-02 SURGERY — ABDOMINAL AORTOGRAM W/LOWER EXTREMITY
Anesthesia: LOCAL

## 2020-12-02 MED ORDER — HEPARIN (PORCINE) IN NACL 1000-0.9 UT/500ML-% IV SOLN
INTRAVENOUS | Status: DC | PRN
  Administered 2020-12-02 (×2): 500 mL

## 2020-12-02 MED ORDER — CLOPIDOGREL BISULFATE 300 MG PO TABS
ORAL_TABLET | ORAL | Status: AC
  Filled 2020-12-02: qty 1

## 2020-12-02 MED ORDER — HEPARIN SODIUM (PORCINE) 1000 UNIT/ML IJ SOLN
INTRAMUSCULAR | Status: DC | PRN
  Administered 2020-12-02: 10000 [IU] via INTRAVENOUS

## 2020-12-02 MED ORDER — CLOPIDOGREL BISULFATE 75 MG PO TABS: 300.0000 mg | ORAL_TABLET | Freq: Once | ORAL | Status: DC

## 2020-12-02 MED ORDER — ASPIRIN EC 81 MG PO TBEC: 81.0000 mg | DELAYED_RELEASE_TABLET | Freq: Every day | ORAL | Status: DC

## 2020-12-02 MED ORDER — MIDAZOLAM HCL 2 MG/2ML IJ SOLN
INTRAMUSCULAR | Status: AC
  Filled 2020-12-02: qty 2

## 2020-12-02 MED ORDER — ASPIRIN 81 MG PO CHEW
CHEWABLE_TABLET | ORAL | Status: AC
  Filled 2020-12-02: qty 1

## 2020-12-02 MED ORDER — LABETALOL HCL 5 MG/ML IV SOLN
10.0000 mg | INTRAVENOUS | Status: DC | PRN
Start: 2020-12-02 — End: 2020-12-02

## 2020-12-02 MED ORDER — HEPARIN SODIUM (PORCINE) 1000 UNIT/ML IJ SOLN
INTRAMUSCULAR | Status: AC
  Filled 2020-12-02: qty 1

## 2020-12-02 MED ORDER — FENTANYL CITRATE (PF) 100 MCG/2ML IJ SOLN
INTRAMUSCULAR | Status: DC | PRN
  Administered 2020-12-02 (×2): 25 ug via INTRAVENOUS

## 2020-12-02 MED ORDER — IODIXANOL 320 MG/ML IV SOLN
INTRAVENOUS | Status: DC | PRN
  Administered 2020-12-02: 105 mL

## 2020-12-02 MED ORDER — SODIUM CHLORIDE 0.9 % IV SOLN: 250.0000 mL | INTRAVENOUS | Status: DC | PRN

## 2020-12-02 MED ORDER — SODIUM CHLORIDE 0.9 % WEIGHT BASED INFUSION: 1.0000 mL/kg/h | INTRAVENOUS | Status: DC

## 2020-12-02 MED ORDER — ASPIRIN 81 MG PO CHEW
CHEWABLE_TABLET | ORAL | Status: DC | PRN
  Administered 2020-12-02: 81 mg via ORAL

## 2020-12-02 MED ORDER — SODIUM CHLORIDE 0.9% FLUSH: 3.0000 mL | Freq: Two times a day (BID) | INTRAVENOUS | Status: DC

## 2020-12-02 MED ORDER — MIDAZOLAM HCL 2 MG/2ML IJ SOLN
INTRAMUSCULAR | Status: DC | PRN
  Administered 2020-12-02: 1 mg via INTRAVENOUS

## 2020-12-02 MED ORDER — CLOPIDOGREL BISULFATE 75 MG PO TABS
75.0000 mg | ORAL_TABLET | Freq: Every day | ORAL | 11 refills | Status: DC
  Filled 2020-12-02: qty 30, 30d supply, fill #0

## 2020-12-02 MED ORDER — LIDOCAINE HCL (PF) 1 % IJ SOLN
INTRAMUSCULAR | Status: AC
  Filled 2020-12-02: qty 30

## 2020-12-02 MED ORDER — CLOPIDOGREL BISULFATE 75 MG PO TABS: 75.0000 mg | ORAL_TABLET | Freq: Every day | ORAL | Status: DC

## 2020-12-02 MED ORDER — DIPHENHYDRAMINE HCL 50 MG/ML IJ SOLN: 25.0000 mg | Freq: Once | INTRAMUSCULAR | Status: AC

## 2020-12-02 MED ORDER — SODIUM CHLORIDE 0.9 % IV SOLN: INTRAVENOUS | Status: DC

## 2020-12-02 MED ORDER — SODIUM CHLORIDE 0.9% FLUSH: 3.0000 mL | INTRAVENOUS | Status: DC | PRN

## 2020-12-02 MED ORDER — ACETAMINOPHEN 325 MG PO TABS
650.0000 mg | ORAL_TABLET | ORAL | Status: DC | PRN
  Administered 2020-12-02: 650 mg via ORAL
  Filled 2020-12-02: qty 2

## 2020-12-02 MED ORDER — FENTANYL CITRATE (PF) 100 MCG/2ML IJ SOLN
INTRAMUSCULAR | Status: AC
  Filled 2020-12-02: qty 2

## 2020-12-02 MED ORDER — METHYLPREDNISOLONE SODIUM SUCC 125 MG IJ SOLR
125.0000 mg | Freq: Once | INTRAMUSCULAR | Status: AC
  Administered 2020-12-02: 125 mg via INTRAVENOUS
  Filled 2020-12-02: qty 2

## 2020-12-02 MED ORDER — CLOPIDOGREL BISULFATE 300 MG PO TABS
ORAL_TABLET | ORAL | Status: DC | PRN
  Administered 2020-12-02: 300 mg via ORAL

## 2020-12-02 MED ORDER — LIDOCAINE HCL (PF) 1 % IJ SOLN
INTRAMUSCULAR | Status: DC | PRN
  Administered 2020-12-02: 18 mL

## 2020-12-02 MED ORDER — HYDRALAZINE HCL 20 MG/ML IJ SOLN: 5.0000 mg | INTRAMUSCULAR | Status: DC | PRN

## 2020-12-02 MED ORDER — ONDANSETRON HCL 4 MG/2ML IJ SOLN: 4.0000 mg | Freq: Four times a day (QID) | INTRAMUSCULAR | Status: DC | PRN

## 2020-12-02 MED ORDER — HEPARIN (PORCINE) IN NACL 1000-0.9 UT/500ML-% IV SOLN
INTRAVENOUS | Status: AC
  Filled 2020-12-02: qty 1000

## 2020-12-02 MED ORDER — DIPHENHYDRAMINE HCL 50 MG/ML IJ SOLN
INTRAMUSCULAR | Status: AC
  Administered 2020-12-02: 25 mg via INTRAVENOUS
  Filled 2020-12-02: qty 1

## 2020-12-02 SURGICAL SUPPLY — 25 items
BALLN MUSTANG 6X150X135 (BALLOONS) ×3
BALLN MUSTANG 7.0X40 135 (BALLOONS) ×3
BALLOON MUSTANG 6X150X135 (BALLOONS) ×2 IMPLANT
BALLOON MUSTANG 7.0X40 135 (BALLOONS) ×2 IMPLANT
CATH OMNI FLUSH 5F 65CM (CATHETERS) ×3 IMPLANT
CATH SOFT-VU 4F 65 STRAIGHT (CATHETERS) ×2 IMPLANT
CATH SOFT-VU STRAIGHT 4F 65CM (CATHETERS) ×1
CLOSURE PERCLOSE PROSTYLE (VASCULAR PRODUCTS) ×6 IMPLANT
COVER DOME SNAP 22 D (MISCELLANEOUS) ×3 IMPLANT
GLIDEWIRE ADV .035X260CM (WIRE) ×3 IMPLANT
GUIDEWIRE ANGLED .035X150CM (WIRE) ×3 IMPLANT
KIT ENCORE 26 ADVANTAGE (KITS) ×3 IMPLANT
KIT MICROPUNCTURE NIT STIFF (SHEATH) ×3 IMPLANT
KIT PV (KITS) ×3 IMPLANT
SHEATH PINNACLE 5F 10CM (SHEATH) ×3 IMPLANT
SHEATH PINNACLE ST 6F 45CM (SHEATH) ×3 IMPLANT
SHEATH PINNACLE ST 6F 65CM (SHEATH) ×3 IMPLANT
SHEATH PROBE COVER 6X72 (BAG) ×3 IMPLANT
STENT ELUVIA 7X150X130 (Permanent Stent) ×3 IMPLANT
STENT INNOVA 8X40X130 (Permanent Stent) ×3 IMPLANT
SYR MEDRAD MARK V 150ML (SYRINGE) ×3 IMPLANT
TRANSDUCER W/STOPCOCK (MISCELLANEOUS) ×3 IMPLANT
TRAY PV CATH (CUSTOM PROCEDURE TRAY) ×3 IMPLANT
WIRE AMPLATZ SS-J .035X260CM (WIRE) ×3 IMPLANT
WIRE BENTSON .035X145CM (WIRE) ×3 IMPLANT

## 2020-12-02 NOTE — Op Note (Signed)
DATE OF SERVICE: 12/02/2020  PATIENT:  Ricardo Howard  60 y.o. male  PRE-OPERATIVE DIAGNOSIS:  Atherosclerosis of native arteries of left lower extremity causing ulceration  POST-OPERATIVE DIAGNOSIS:  Same  PROCEDURE:   1) US guided right common femoral artery access 2) Aortogram 3) Left lower extremity angiogram with third order cannulation ( total contrast) 4) Left superficial femoral / above knee popliteal artery stenting (7x148mm Eluvia) 5) Left external iliac artery stenting (8x41mm Innova) 6) Conscious sedation (68 minutes)  SURGEON:  Rande Brunt. Lenell Antu, MD  ASSISTANT: none  ANESTHESIA:   local and IV sedation  ESTIMATED BLOOD LOSS: minimal  LOCAL MEDICATIONS USED:  LIDOCAINE   COUNTS: confirmed correct.  PATIENT DISPOSITION:  PACU - hemodynamically stable.   Delay start of Pharmacological VTE agent (>24hrs) due to surgical blood loss or risk of bleeding: no  INDICATION FOR PROCEDURE: Ricardo Howard is a 60 y.o. male with left foot interdigital ulceration with noninvasive vascular lab evidence of peripheral arterial disease. After careful discussion of risks, benefits, and alternatives the patient was offered left lower extremity angiography. We specifically discussed access site complications. The patient  understood and wished to proceed.  OPERATIVE FINDINGS:  Terminal aorta and iliac arteries: Terminal aorta widely patent Bilateral common iliac arteries widely patent Left hypogastric artery stenosed at its origin Severe left external stenosis (approximately 80%) Severe stenosis in right external iliac artery (catheter and sheath nearly occlusive)  Left lower extremity: Common femoral artery: Atherosclerosis without flow-limiting stenosis Profunda femoris artery: Atherosclerosis without flow-limiting stenosis  Superficial femoral artery: Critical (99%) stenosis at Hunter's canal Popliteal artery: Critical (99%) stenosis at Hunter's canal; remainder of  popliteal artery widely patent Anterior tibial artery: Atherosclerosis without flow-limiting stenosis. Vessel courses to the foot. Tibioperoneal trunk: Atherosclerosis without flow-limiting stenosis.  Peroneal artery: Atherosclerosis without flow-limiting stenosis. Vessel courses to the ankle. Posterior tibial artery: Atherosclerosis without flow-limiting stenosis. Vessel courses to the foot. Pedal circulation: Fills via PT and AT.  WIfI score. Stage 2: expected 10% risk of major amputation within the year; revascularization likely to reduce risk of amputation  DESCRIPTION OF PROCEDURE: After identification of the patient in the pre-operative holding area, the patient was transferred to the operating room. The patient was positioned supine on the operating room table. Anesthesia was induced. The groins was prepped and draped in standard fashion. A surgical pause was performed confirming correct patient, procedure, and operative location.  The right groin was anesthetized with subcutaneous injection of 1% lidocaine. Using ultrasound guidance, the right common femoral artery was accessed with micropuncture technique. Fluoroscopy was used to confirm cannulation over the femoral head. The 83F sheath was upsized to 42F.   A Benson wire was advanced into the distal aorta. Over the wire an omni flush catheter was advanced to the level of L2. Aortogram was performed - see above for details.   The left common iliac artery was selected with a glidewire advantage guidewire. The wire was advanced into the common femoral artery. Over the wire the omni flush catheter was advanced into the external iliac artery. Selective angiography was performed - see above for details.   The decision was made to intervene. The patient was heparinized with 10,000 units of heparin. The 42F sheath was exchanged for a 48F x 45cm sheath. Selective angiography of the left lower extremity was performed prior to intervention.   The  lesions were treated with: Left superficial femoral / above knee popliteal artery stenting (7x162mm Eluvia) Left external iliac artery stenting (8x52mm  Innova)  Completion angiography revealed:  Resolution of L SFA and L EIA stenosis.  A perclose device was used to close the arteriotomy. Hemostasis was excellent upon completion.  Conscious sedation was administered with the use of IV fentanyl and midazolam under continuous physician and nurse monitoring.  Heart rate, blood pressure, and oxygen saturation were continuously monitored.  Total sedation time was 68 minutes  Upon completion of the case instrument and sharps counts were confirmed correct. The patient was transferred to the PACU in good condition. I was present for all portions of the procedure.  PLAN: Continue ASA 81mg  PO QD indefinitely. Start Plavix 75mg  PO QD x 12 months. Continue high intensity statin use indefinitely. Follow up in clinic in 1-2 weeks to check ABI / LLE duplex. Needs local wound care to foot until healed.  . , MD [facility] Phone Number: [phone] 12/02/2020 1:28 PM

## 2020-12-02 NOTE — Interval H&P Note (Signed)
History and Physical Interval Note:  12/02/2020 9:51 AM  Ricardo Howard  has presented today for surgery, with the diagnosis of pad w/ tissue loss.  The various methods of treatment have been discussed with the patient and family. After consideration of risks, benefits and other options for treatment, the patient has consented to  Procedure(s): ABDOMINAL AORTOGRAM W/LOWER EXTREMITY (N/A) as a surgical intervention.  The patient's history has been reviewed, patient examined, no change in status, stable for surgery.  I have reviewed the patient's chart and labs.  Questions were answered to the patient's satisfaction.     Leonie Douglas

## 2020-12-05 ENCOUNTER — Telehealth: Payer: Self-pay | Admitting: Pharmacist

## 2020-12-05 ENCOUNTER — Encounter (HOSPITAL_COMMUNITY): Payer: Self-pay | Admitting: Vascular Surgery

## 2020-12-05 NOTE — Telephone Encounter (Signed)
12/05/2020 Left VM for Ricardo Howard informing this is a TOC call from outpt Pharmacy and that he can expect another call at a later date.

## 2020-12-06 ENCOUNTER — Telehealth: Payer: Self-pay | Admitting: Pulmonary Disease

## 2020-12-06 ENCOUNTER — Other Ambulatory Visit: Payer: Self-pay

## 2020-12-06 DIAGNOSIS — I739 Peripheral vascular disease, unspecified: Secondary | ICD-10-CM

## 2020-12-06 NOTE — Telephone Encounter (Signed)
Called and spoke with Patient.  Patient stated he has had a sleep study in the past "many years ago".  Patient does not use a cpap.

## 2020-12-06 NOTE — Telephone Encounter (Signed)
Pharmacy Transitions of Care Follow-up Telephone Call  Date of discharge: 12/02/2020  Discharge Diagnosis: Abdominal aortogram w/ lower extremity   Medication changes made at discharge:  START taking: clopidogrel (Plavix) ASK how to take: nicotine 14 mg/24hr patch (NICODERM CQ - dosed in mg/24 hours)  Medication changes verified by the patient? Yes   Medication Accessibility:  Was the patient provided with refills on discharged medications? Yes, 11 refills   Have all prescriptions been transferred from [facility] to home pharmacy? N/A   Is the patient able to afford medications? Yes    Medication Review:  CLOPIDOGREL (PLAVIX) Clopidogrel 75 mg once daily.  - Reviewed potential DDIs with patient  - Advised patient of medications to avoid (NSAIDs, ASA)  - Educated that Tylenol (acetaminophen) will be the preferred analgesic to prevent risk of bleeding  - Emphasized importance of monitoring for signs and symptoms of bleeding (abnormal bruising, prolonged bleeding, nose bleeds, bleeding from gums, discolored urine, black tarry stools)  - Advised patient to alert all providers of anticoagulation therapy prior to starting a new medication or having a procedure   Follow-up Appointments:  OCT 24 LE ARTERIAL Monday Dec 12, 2020 10:00 AM [facility] [address] [place] [address] [phone] Ankle Brachial Indices Monday Dec 12, 2020 10:30 AM [facility] [address] [place] [address] [phone] Postop with Leonie Douglas, MD Monday Dec 12, 2020 11:00 AM [facility] [address] [place] [address] [phone] OCT 25 SLEEP CONSULT with Tomma Lightning, MD Tuesday Dec 13, 2020 11:30 AM [facility] [address] [place] [address] [phone]   If their condition worsens, is the pt aware to call PCP or go to the Emergency Dept.?  Yes  Final Patient Assessment: Talked to pt's spouse and she confirmed pt is doing well on new medication, clopidogrel, and that the only side effect he is experiencing is diarrhea. I suggested ways to treat the diarrhea without stopping the clopidogrel and informed her that it is important that he remain on the medication.

## 2020-12-11 NOTE — Progress Notes (Deleted)
[facility] [place] PROGRESS NOTE  ASSESSMENT / PLAN: Ricardo Howard is a 60 y.o. male status post left superficial femoral / above knee popliteal artery stenting (7x171mm Eluvia); left external iliac artery stenting (8x42mm Innova) for left lower extremity chronic limb threatening ischemia.   Recommend the following:  Complete cessation from all tobacco products. Blood glucose control with goal A1c < 7%. Blood pressure control with goal blood pressure < 140/90 mmHg. Lipid reduction therapy with goal LDL-C <100 mg/dL (<64 if symptomatic from PAD).  Aspirin 81mg  PO QD.  Clopidogrel 75mg  PO QD. Continue this for a minimum of one month.  Atorvastatin 40-80mg  PO QD (or other "high intensity" statin therapy).  Patient needs local wound care to the foot: recommend betadine pain applied daily and pressure offloading. Will refer to wound care. Follow up with me or Dr. in 1 month for wound check.   SUBJECTIVE: ***  OBJECTIVE: There were no vitals taken for this visit. @INTAKEOUTPUTBRIEF @  Urine output over past 24 hours: ***  Constitutional: *** appearing. *** acute distress. CNS: *** Cardiac: ***. Pulmonary: *** Abdomen: *** Vascular: ***  CBC Latest Ref Rng & Units 12/02/2020 10/26/2020 07/19/2020  WBC 4.0 - 10.5 K/uL - 6.9 8.9  Hemoglobin 13.0 - 17.0 g/dL 12/04/2020 12/26/2020 12.9(L)  Hematocrit 39.0 - 52.0 % 47.0 46.6 39.2  Platelets 150 - 400 K/uL - 183 149(L)     CMP Latest Ref Rng & Units 12/02/2020 10/26/2020 07/21/2020  Glucose 70 - 99 mg/dL 12/04/2020) 12/26/2020) 09/20/2020)  BUN 8 - 23 mg/dL 11 9 884(Z)  Creatinine 0.61 - 1.24 mg/dL 660(Y) 301(S <0(F  Sodium 135 - 145 mmol/L 138 134(L) 136  Potassium 3.5 - 5.1 mmol/L 4.6 4.1 3.4(L)  Chloride 98 - 111 mmol/L 104 103 103  CO2 22 - 32 mmol/L - 23 25  Calcium 8.9 - 10.3 mg/dL - 8.9 0.93(A)  Total Protein 6.5 - 8.1 g/dL - - 6.1(L)  Total Bilirubin 0.3 - 1.2 mg/dL - - 0.8  Alkaline Phos 38 - 126 U/L - - 56  AST 15 - 41  U/L - - 27  ALT 0 - 44 U/L - - 31    Estimated Creatinine Clearance: 114.8 mL/min (A) (by C-G formula based on SCr of 0.6 mg/dL (L)).  ***  3.55. 7.32, MD [facility] Phone Number: [phone] 12/11/2020 8:59 AM

## 2020-12-12 ENCOUNTER — Encounter (HOSPITAL_COMMUNITY): Payer: No Typology Code available for payment source

## 2020-12-12 ENCOUNTER — Encounter: Payer: No Typology Code available for payment source | Admitting: Vascular Surgery

## 2020-12-13 ENCOUNTER — Ambulatory Visit (INDEPENDENT_AMBULATORY_CARE_PROVIDER_SITE_OTHER): Payer: No Typology Code available for payment source | Admitting: Pulmonary Disease

## 2020-12-13 ENCOUNTER — Other Ambulatory Visit: Payer: Self-pay

## 2020-12-13 ENCOUNTER — Encounter: Payer: Self-pay | Admitting: Pulmonary Disease

## 2020-12-13 VITALS — BP 130/80 | HR 90 | Temp 97.6°F | Ht 68.0 in | Wt 242.6 lb

## 2020-12-13 DIAGNOSIS — G4733 Obstructive sleep apnea (adult) (pediatric): Secondary | ICD-10-CM | POA: Diagnosis not present

## 2020-12-13 NOTE — Progress Notes (Signed)
Ricardo Howard    [id number]    05/10/1960  Primary Care Physician:[facility]  Referring Physician: Carleene Cooper, MD [address] [place],  [place] [address]  Chief complaint:   Patient with a history of obstructive sleep apnea  HPI:  Diagnosed with obstructive sleep apnea about 10 years ago Use CPAP for less than a year  Just could not get used to it back then Was using a fullface mask  Does not remember how severe his sleep apnea was but mention about 20-30 probable breath-hold  Usually goes to bed about 9 PM Takes him about 30 minutes to fall asleep About 3 awakenings Final wake up time between 730 and 8 AM  He does use sleep aids to help him sleep  Sleep is nonrestorative and active smoker only smoking about 3 cigarettes a day at present Smoked about a pack a day previously  Recently started on oxygen supplementation   Outpatient Encounter Medications as of 12/13/2020  Medication Sig   acetaminophen (TYLENOL) 325 MG tablet Take 2 tablets (650 mg total) by mouth every 6 (six) hours as needed. (Patient taking differently: Take 650 mg by mouth every 6 (six) hours as needed for mild pain or moderate pain.)   amLODipine (NORVASC) 5 MG tablet Take 5 mg by mouth daily.   aspirin 81 MG tablet Take 81 mg by mouth daily.   atorvastatin (LIPITOR) 80 MG tablet Take 80 mg by mouth daily.   buPROPion (WELLBUTRIN SR) 150 MG 12 hr tablet Take 150 mg by mouth in the morning and at bedtime.   carvedilol (COREG) 25 MG tablet Take 25 mg by mouth 2 (two) times daily with a meal.   clopidogrel (PLAVIX) 75 MG tablet Take 1 tablet (75 mg total) by mouth daily.   folic acid (FOLVITE) 1 MG tablet Take 1 tablet (1 mg total) by mouth daily.   furosemide (LASIX) 20 MG tablet Take 0.5 tablets by mouth daily.   gabapentin (NEURONTIN) 300 MG capsule Take 300-600 mg by mouth See admin instructions. Take 300 mg morning, noon at 600 mg at bedtime   hydrOXYzine  (VISTARIL) 25 MG capsule Take 25 mg by mouth at bedtime as needed (Sleep).   insulin aspart (NOVOLOG) 100 UNIT/ML FlexPen Inject 4 Units into the skin 3 (three) times daily with meals. (Patient taking differently: Inject 0-35 Units into the skin See admin instructions. Per sliding scale with meals)   insulin glargine (LANTUS) 100 UNIT/ML Solostar Pen Inject 16 Units into the skin daily.   insulin glargine-yfgn (SEMGLEE) 100 UNIT/ML Pen Inject 65 Units into the skin daily.   Insulin Pen Needle (NOVOFINE) 30G X 8 MM MISC Inject 10 each into the skin as needed.   isosorbide mononitrate (IMDUR) 30 MG 24 hr tablet Take 30 mg by mouth daily.   lipase/protease/amylase (CREON) 36000 UNITS CPEP capsule Take 1 capsule (36,000 Units total) by mouth 3 (three) times daily before meals.   losartan (COZAAR) 100 MG tablet Take 50 mg by mouth daily.   lurasidone (LATUDA) 20 MG TABS tablet Take 40 mg by mouth daily.   magnesium oxide (MAG-OX) 400 (241.3 Mg) MG tablet Take 1 tablet (400 mg total) by mouth 2 (two) times daily.   mesalamine (LIALDA) 1.2 g EC tablet Take 1,200 mg by mouth daily.   metFORMIN (GLUCOPHAGE) 500 MG tablet Take 500 mg by mouth daily with breakfast.   naproxen sodium (ALEVE) 220 MG tablet Take  440 mg by mouth 2 (two) times daily as needed (headache).   nicotine (NICODERM CQ - DOSED IN MG/24 HOURS) 14 mg/24hr patch Place 14 mg onto the skin daily.   pantoprazole (PROTONIX) 40 MG tablet Take 40 mg by mouth daily.   polyethylene glycol (MIRALAX / GLYCOLAX) 17 g packet Take 17 g by mouth daily as needed for mild constipation.   sertraline (ZOLOFT) 50 MG tablet Take 100 mg by mouth daily.   tamsulosin (FLOMAX) 0.4 MG CAPS capsule Take 0.4 mg by mouth daily.   thiamine 100 MG tablet Take 1 tablet (100 mg total) by mouth daily.   vitamin B-12 (CYANOCOBALAMIN) 500 MCG tablet Take 500 mcg by mouth daily.   [DISCONTINUED] amoxicillin-clavulanate (AUGMENTIN) 875-125 MG tablet Take 1 tablet by mouth 2  (two) times daily. (Patient not taking: Reported on 12/13/2020)   [DISCONTINUED] pramoxine (PROCTOFOAM) 1 % foam Place 1 application rectally 3 (three) times daily as needed for anal itching. (Patient not taking: Reported on 12/13/2020)   No facility-administered encounter medications on file as of 12/13/2020.    Allergies as of 12/13/2020 - Review Complete 12/13/2020  Allergen Reaction Noted   Iohexol Hives 06/14/2019   Semaglutide Other (See Comments) 09/01/2020   Lac bovis Diarrhea 11/08/2011   Lisinopril Cough 05/16/2011   Simvastatin Itching and Nausea Only 08/04/2013    Past Medical History:  Diagnosis Date   Alcohol abuse 06/14/2019   Diabetes mellitus without complication (HCC)    Essential hypertension 06/14/2019   Mixed hyperlipidemia due to type 2 diabetes mellitus (HCC) 07/31/2019   Nicotine dependence, cigarettes, uncomplicated 07/31/2019   Pancreatitis    Uncontrolled type 2 diabetes mellitus with diabetic polyneuropathy, with long-term current use of insulin 07/31/2019    Past Surgical History:  Procedure Laterality Date   ABDOMINAL AORTOGRAM W/LOWER EXTREMITY N/A 12/02/2020   Procedure: ABDOMINAL AORTOGRAM W/LOWER EXTREMITY;  Surgeon: Leonie Douglas, MD;  Location: [facility];  Service: Cardiovascular;  Laterality: N/A;   AMPUTATION Right 03/13/2020   Procedure: TRANSMETATARSAL AMPUTATION;  Surgeon: Chuck Hint, MD;  Location: [facility] OR;  Service: Vascular;  Laterality: Right;   AMPUTATION Right 03/15/2020   Procedure: RIGHT BELOW KNEE AMPUTATION;  Surgeon: Chuck Hint, MD;  Location: [facility] OR;  Service: Vascular;  Laterality: Right;   CORONARY ANGIOPLASTY WITH STENT PLACEMENT     PERIPHERAL VASCULAR INTERVENTION  12/02/2020   Procedure: PERIPHERAL VASCULAR INTERVENTION;  Surgeon: Leonie Douglas, MD;  Location: [facility];  Service: Cardiovascular;;  Lt SFA, LEIA    Family History  Problem Relation Age of Onset   Other Neg Hx      Social History   Socioeconomic History   Marital status: Married    Spouse name: Not on file   Number of children: Not on file   Years of education: Not on file   Highest education level: Not on file  Occupational History   Not on file  Tobacco Use   Smoking status: Every Day    Packs/day: 0.25    Types: Cigarettes   Smokeless tobacco: Never  Vaping Use   Vaping Use: Never used  Substance and Sexual Activity   Alcohol use: Yes    Comment: etoh abuse   Drug use: Not Currently   Sexual activity: Not on file  Other Topics Concern   Not on file  Social History Narrative   Not on file   Social Determinants of Health   Financial Resource Strain: Not on file  Food Insecurity: Not on file  Transportation Needs: Not on file  Physical Activity: Not on file  Stress: Not on file  Social Connections: Not on file  Intimate Partner Violence: Not on file    Review of Systems  Constitutional:  Positive for fatigue.  Respiratory:  Positive for apnea and shortness of breath.   Psychiatric/Behavioral:  Positive for sleep disturbance.   All other systems reviewed and are negative.  Vitals:   12/13/20 1148  BP: 130/80  Pulse: 90  Temp: 97.6 F (36.4 C)  SpO2: 98%     Physical Exam Constitutional:      Appearance: He is obese.  HENT:     Head: Normocephalic.     Mouth/Throat:     Mouth: Mucous membranes are moist.     Comments: Mallampati 3, crowded oropharynx, macroglossia Cardiovascular:     Rate and Rhythm: Normal rate and regular rhythm.     Heart sounds: No murmur heard.   No friction rub.  Pulmonary:     Effort: No respiratory distress.     Breath sounds: No stridor.  Musculoskeletal:     Cervical back: No rigidity or tenderness.  Neurological:     Mental Status: He is alert.  Psychiatric:        Mood and Affect: Mood normal.   Results of the Epworth flowsheet 12/13/2020  Sitting and reading 2  Watching TV 2  Sitting, inactive in a public place  (e.g. a theatre or a meeting) 2  As a passenger in a car for an hour without a break 2  Lying down to rest in the afternoon when circumstances permit 2  Sitting and talking to someone 2  Sitting quietly after a lunch without alcohol 1  In a car, while stopped for a few minutes in traffic 2  Total score 15    Previous study not available  Assessment:  Excessive daytime sleepiness  Known history of obstructive sleep apnea  Pathophysiology of sleep disordered breathing discussed with the patient Treatment options discussed with the patient  Chronic respiratory failure for which is on oxygen supplementation  Tobacco abuse  Likely obstructive lung disease  Plan/Recommendations: We will schedule patient for split-night study  Smoking cessation counseling provided  Tentative follow-up in 3 months  Encouraged to call with any significant concerns  Multiple factors may have been responsible for intolerance to CPAP in the past, may have been interface related  Did discuss an inspire device as an option of treatment in obstructive sleep apnea intolerant to CPAP  Importance of quitting smoking discussed with the patient extensively  Weight loss efforts   Virl Diamond MD [facility] 12/13/2020, 12:11 PM  CC: Carleene Cooper, MD

## 2020-12-13 NOTE — Patient Instructions (Signed)
Schedule for split-night study  I will see you back in about 3 months   Call with significant concerns  Continue using oxygen on a regular basis Work on quitting smoking  Sleep Apnea Sleep apnea affects breathing during sleep. It causes breathing to stop for 10 seconds or more, or to become shallow. People with sleep apnea usually snore loudly. It can also increase the risk of: Heart attack. Stroke. Being very overweight (obese). Diabetes. Heart failure. Irregular heartbeat. High blood pressure. The goal of treatment is to help you breathe normally again. What are the causes? The most common cause of this condition is a collapsed or blocked airway. There are three kinds of sleep apnea: Obstructive sleep apnea. This is caused by a blocked or collapsed airway. Central sleep apnea. This happens when the brain does not send the right signals to the muscles that control breathing. Mixed sleep apnea. This is a combination of obstructive and central sleep apnea. What increases the risk? Being overweight. Smoking. Having a small airway. Being older. Being male. Drinking alcohol. Taking medicines to calm yourself (sedatives or tranquilizers). Having family members with the condition. Having a tongue or tonsils that are larger than normal. What are the signs or symptoms? Trouble staying asleep. Loud snoring. Headaches in the morning. Waking up gasping. Dry mouth or sore throat in the morning. Being sleepy or tired during the day. If you are sleepy or tired during the day, you may also: Not be able to focus your mind (concentrate). Forget things. Get angry a lot and have mood swings. Feel sad (depressed). Have changes in your personality. Have less interest in sex, if you are male. Be unable to have an erection, if you are male. How is this treated?  Sleeping on your side. Using a medicine to get rid of mucus in your nose (decongestant). Avoiding the use of alcohol,  medicines to help you relax, or certain pain medicines (narcotics). Losing weight, if needed. Changing your diet. Quitting smoking. Using a machine to open your airway while you sleep, such as: An oral appliance. This is a mouthpiece that shifts your lower jaw forward. A CPAP device. This device blows air through a mask when you breathe out (exhale). An EPAP device. This has valves that you put in each nostril. A BPAP device. This device blows air through a mask when you breathe in (inhale) and breathe out. Having surgery if other treatments do not work. Follow these instructions at home: Lifestyle Make changes that your doctor recommends. Eat a healthy diet. Lose weight if needed. Avoid alcohol, medicines to help you relax, and some pain medicines. Do not smoke or use any products that contain nicotine or tobacco. If you need help quitting, ask your doctor. General instructions Take over-the-counter and prescription medicines only as told by your doctor. If you were given a machine to use while you sleep, use it only as told by your doctor. If you are having surgery, make sure to tell your doctor you have sleep apnea. You may need to bring your device with you. Keep all follow-up visits. Contact a doctor if: The machine that you were given to use during sleep bothers you or does not seem to be working. You do not get better. You get worse. Get help right away if: Your chest hurts. You have trouble breathing in enough air. You have an uncomfortable feeling in your back, arms, or stomach. You have trouble talking. One side of your body feels weak. A part  of your face is hanging down. These symptoms may be an emergency. Get help right away. Call your local emergency services (911 in the [place] Do not wait to see if the symptoms will go away. Do not drive yourself to the hospital. Summary This condition affects breathing during sleep. The most common cause is a collapsed or blocked  airway. The goal of treatment is to help you breathe normally while you sleep. This information is not intended to replace advice given to you by your health care provider. Make sure you discuss any questions you have with your health care provider. Document Revised: 01/15/2020 Document Reviewed: 01/15/2020 Elsevier Patient Education  2022 [employer].

## 2020-12-15 ENCOUNTER — Ambulatory Visit (HOSPITAL_COMMUNITY)
Admission: RE | Admit: 2020-12-15 | Discharge: 2020-12-15 | Disposition: A | Discharge status: Home / Self Care | Payer: No Typology Code available for payment source | Source: Ambulatory Visit | Attending: Vascular Surgery | Admitting: Vascular Surgery

## 2020-12-15 ENCOUNTER — Other Ambulatory Visit: Payer: Self-pay

## 2020-12-15 ENCOUNTER — Ambulatory Visit (INDEPENDENT_AMBULATORY_CARE_PROVIDER_SITE_OTHER)
Admission: RE | Admit: 2020-12-15 | Discharge: 2020-12-15 | Disposition: A | Discharge status: Home / Self Care | Payer: No Typology Code available for payment source | Source: Ambulatory Visit | Attending: Vascular Surgery | Admitting: Vascular Surgery

## 2020-12-15 DIAGNOSIS — I739 Peripheral vascular disease, unspecified: Secondary | ICD-10-CM

## 2020-12-19 ENCOUNTER — Telehealth: Payer: Self-pay

## 2020-12-19 NOTE — Telephone Encounter (Signed)
Ricardo Howard at podiatry office called to let us know pt L foot ulcers (btwn first and third digit) are worsening and they are concerned about possibly osteomyelitis. They are doing foot xray with labs and will send [place] results via fax this afternoon. Pt is scheduled with MD for post op appt tomorrow morning. MD has been made aware.

## 2020-12-19 NOTE — Progress Notes (Signed)
[facility] [place] PROGRESS NOTE  ASSESSMENT / PLAN: Ricardo Howard is a 60 y.o. male status post left superficial femoral / above knee popliteal artery stenting (7x178mm Eluvia); left external iliac artery stenting (8x12mm Innova) for left lower extremity chronic limb threatening ischemia.   Recommend the following:  Complete cessation from all tobacco products. Blood glucose control with goal A1c < 7%. Blood pressure control with goal blood pressure < 140/90 mmHg. Lipid reduction therapy with goal LDL-C <100 mg/dL (<41 if symptomatic from PAD).  Aspirin 81mg  PO QD.  Clopidogrel 75mg  PO QD. Continue this for a minimum of one month.  Atorvastatin 40-80mg  PO QD (or other "high intensity" statin therapy).  Third toe darkening. Worrisome for gangrene. I offered patient third toe amputation or watchful waiting. He prefers the later. Instructed patient on "flossing" betadine soaked gauze through the toes. Change daily. Follow up with me in 2 weeks for wound check.   SUBJECTIVE: Doing well. Third toe dark. Interdigital ulceration continues. Many areas appear healed. Third toe has active ulceration and drainage.  OBJECTIVE: BP 121/79 (BP Location: Left Arm, Patient Position: Sitting, Cuff Size: Large)   Pulse 90   Temp 98.7 F (37.1 C)   Resp 20   Ht 5\' 8"  (1.727 m)   Wt 242 lb (109.8 kg)   SpO2 95%   BMI 36.80 kg/m   Constitutional: well appearing. no acute distress. Cardiac: RRR. Pulmonary: unlabored Abdomen: soft Vascular: L foot with healing interdigital ulceration between toes 1/2, 4/5. Interdigital ulceration about third toe still active. Third toe dusky.   CBC Latest Ref Rng & Units 12/02/2020 10/26/2020 07/19/2020  WBC 4.0 - 10.5 K/uL - 6.9 8.9  Hemoglobin 13.0 - 17.0 g/dL 12/04/2020 12/26/2020 12.9(L)  Hematocrit 39.0 - 52.0 % 47.0 46.6 39.2  Platelets 150 - 400 K/uL - 183 149(L)     CMP Latest Ref Rng & Units 12/02/2020 10/26/2020 07/21/2020  Glucose 70 - 99  mg/dL 12/04/2020) 12/26/2020) 09/20/2020)  BUN 8 - 23 mg/dL 11 9 027(O)  Creatinine 0.61 - 1.24 mg/dL 536(U) 440(H <4(V  Sodium 135 - 145 mmol/L 138 134(L) 136  Potassium 3.5 - 5.1 mmol/L 4.6 4.1 3.4(L)  Chloride 98 - 111 mmol/L 104 103 103  CO2 22 - 32 mmol/L - 23 25  Calcium 8.9 - 10.3 mg/dL - 8.9 4.25(Z)  Total Protein 6.5 - 8.1 g/dL - - 6.1(L)  Total Bilirubin 0.3 - 1.2 mg/dL - - 0.8  Alkaline Phos 38 - 126 U/L - - 56  AST 15 - 41 U/L - - 27  ALT 0 - 44 U/L - - 31    Estimated Creatinine Clearance: 116.6 mL/min (A) (by C-G formula based on SCr of 0.6 mg/dL (L)).   +-------+-----------+-----------+------------+------------+  ABI/TBIToday's ABIToday's TBIPrevious ABIPrevious TBI  +-------+-----------+-----------+------------+------------+  Right  BKA                   BKA                       +-------+-----------+-----------+------------+------------+  Left   1.12       0.78       0.70        0.31          +-------+-----------+-----------+------------+------------+   Arterial duplex:  Patent stent with no evidence of stenosis in the Distal left SFA  artery. Distal left external iliac artery stent appears to be patent.   5.63. 8.75, MD  [facility] Phone Number: [phone] 12/20/2020 10:05 AM

## 2020-12-20 ENCOUNTER — Ambulatory Visit (INDEPENDENT_AMBULATORY_CARE_PROVIDER_SITE_OTHER): Payer: No Typology Code available for payment source | Admitting: Vascular Surgery

## 2020-12-20 ENCOUNTER — Encounter: Payer: Self-pay | Admitting: Vascular Surgery

## 2020-12-20 ENCOUNTER — Other Ambulatory Visit: Payer: Self-pay

## 2020-12-20 VITALS — BP 121/79 | HR 90 | Temp 98.7°F | Resp 20 | Ht 68.0 in | Wt 242.0 lb

## 2020-12-20 DIAGNOSIS — I739 Peripheral vascular disease, unspecified: Secondary | ICD-10-CM

## 2021-01-02 NOTE — Progress Notes (Signed)
[facility]  PROGRESS NOTE  ASSESSMENT / PLAN: Ricardo Howard is a 60 y.o. male status post left superficial femoral / above knee popliteal artery stenting (7x153mm Eluvia); left external iliac artery stenting (8x87mm Innova) for left lower extremity chronic limb threatening ischemia.   Recommend the following:  Complete cessation from all tobacco products. Blood glucose control with goal A1c < 7%. Blood pressure control with goal blood pressure < 140/90 mmHg. Lipid reduction therapy with goal LDL-C <100 mg/dL (<33 if symptomatic from PAD).  Aspirin 81mg  PO QD.  Clopidogrel 75mg  PO QD. Continue this for a minimum of one month.  Atorvastatin 40-80mg  PO QD (or other "high intensity" statin therapy).  The patient's interdigital ulceration continues to improve.  His third toe still has ischemic skin changes.  He prefers watchful waiting.  We will continue this for another month.  SUBJECTIVE: Doing well. Third toe dark. Interdigital ulceration continues. Many areas appear healed. Third toe has active ulceration and drainage.  OBJECTIVE: BP 121/76 (BP Location: Left Arm, Patient Position: Sitting, Cuff Size: Large)   Pulse 86   Temp 98.1 F (36.7 C)   Resp 20   Ht 5\' 8"  (1.727 m)   Wt 242 lb (109.8 kg)   SpO2 95%   BMI 36.80 kg/m   Constitutional: well appearing. no acute distress. Cardiac: RRR. Pulmonary: unlabored Abdomen: soft Vascular: L foot with healing interdigital ulceration between toes 1/2, 4/5. Interdigital ulceration about third toe still active. Third toe dusky.   CBC Latest Ref Rng & Units 12/02/2020 10/26/2020 07/19/2020  WBC 4.0 - 10.5 K/uL - 6.9 8.9  Hemoglobin 13.0 - 17.0 g/dL 12/04/2020 12/26/2020 12.9(L)  Hematocrit 39.0 - 52.0 % 47.0 46.6 39.2  Platelets 150 - 400 K/uL - 183 149(L)     CMP Latest Ref Rng & Units 12/02/2020 10/26/2020 07/21/2020  Glucose 70 - 99 mg/dL 12/04/2020) 12/26/2020) 09/20/2020)  BUN 8 - 23 mg/dL 11 9 416(S)  Creatinine 0.61 - 1.24  mg/dL 063(K) 160(F <0(X  Sodium 135 - 145 mmol/L 138 134(L) 136  Potassium 3.5 - 5.1 mmol/L 4.6 4.1 3.4(L)  Chloride 98 - 111 mmol/L 104 103 103  CO2 22 - 32 mmol/L - 23 25  Calcium 8.9 - 10.3 mg/dL - 8.9 3.23(F)  Total Protein 6.5 - 8.1 g/dL - - 6.1(L)  Total Bilirubin 0.3 - 1.2 mg/dL - - 0.8  Alkaline Phos 38 - 126 U/L - - 56  AST 15 - 41 U/L - - 27  ALT 0 - 44 U/L - - 31    CrCl cannot be calculated (Patient's most recent lab result is older than the maximum 21 days allowed.).   +-------+-----------+-----------+------------+------------+  ABI/TBIToday's ABIToday's TBIPrevious ABIPrevious TBI  +-------+-----------+-----------+------------+------------+  Right  BKA                   BKA                       +-------+-----------+-----------+------------+------------+  Left   1.12       0.78       0.70        0.31          +-------+-----------+-----------+------------+------------+   Arterial duplex:  Patent stent with no evidence of stenosis in the Distal left SFA  artery. Distal left external iliac artery stent appears to be patent.   5.73. 2.20, MD [facility] Phone Number: [phone] 01/03/2021  6:44 PM

## 2021-01-03 ENCOUNTER — Ambulatory Visit: Payer: No Typology Code available for payment source | Admitting: Vascular Surgery

## 2021-01-03 ENCOUNTER — Other Ambulatory Visit: Payer: Self-pay

## 2021-01-03 ENCOUNTER — Ambulatory Visit (INDEPENDENT_AMBULATORY_CARE_PROVIDER_SITE_OTHER): Payer: No Typology Code available for payment source | Admitting: Vascular Surgery

## 2021-01-03 VITALS — BP 121/76 | HR 86 | Temp 98.1°F | Resp 20 | Ht 68.0 in | Wt 242.0 lb

## 2021-01-03 DIAGNOSIS — I739 Peripheral vascular disease, unspecified: Secondary | ICD-10-CM

## 2021-01-19 ENCOUNTER — Emergency Department (HOSPITAL_COMMUNITY)
Admission: EM | Admit: 2021-01-19 | Discharge: 2021-01-19 | Disposition: A | Discharge status: Home / Self Care | Payer: No Typology Code available for payment source | Attending: Emergency Medicine | Admitting: Emergency Medicine

## 2021-01-19 DIAGNOSIS — I959 Hypotension, unspecified: Secondary | ICD-10-CM | POA: Insufficient documentation

## 2021-01-19 DIAGNOSIS — Z79899 Other long term (current) drug therapy: Secondary | ICD-10-CM | POA: Insufficient documentation

## 2021-01-19 DIAGNOSIS — E119 Type 2 diabetes mellitus without complications: Secondary | ICD-10-CM | POA: Insufficient documentation

## 2021-01-19 DIAGNOSIS — Z794 Long term (current) use of insulin: Secondary | ICD-10-CM | POA: Diagnosis not present

## 2021-01-19 DIAGNOSIS — Z7984 Long term (current) use of oral hypoglycemic drugs: Secondary | ICD-10-CM | POA: Insufficient documentation

## 2021-01-19 DIAGNOSIS — Z7901 Long term (current) use of anticoagulants: Secondary | ICD-10-CM | POA: Insufficient documentation

## 2021-01-19 DIAGNOSIS — I10 Essential (primary) hypertension: Secondary | ICD-10-CM | POA: Insufficient documentation

## 2021-01-19 DIAGNOSIS — F17210 Nicotine dependence, cigarettes, uncomplicated: Secondary | ICD-10-CM | POA: Insufficient documentation

## 2021-01-19 LAB — COMPREHENSIVE METABOLIC PANEL
ALT: 28 U/L (ref 0–44)
AST: 25 U/L (ref 15–41)
Albumin: 3.4 g/dL — ABNORMAL LOW (ref 3.5–5.0)
Alkaline Phosphatase: 90 U/L (ref 38–126)
Anion gap: 9 (ref 5–15)
BUN: 12 mg/dL (ref 8–23)
CO2: 22 mmol/L (ref 22–32)
Calcium: 9.2 mg/dL (ref 8.9–10.3)
Chloride: 110 mmol/L (ref 98–111)
Creatinine, Ser: 0.96 mg/dL (ref 0.61–1.24)
GFR, Estimated: >60 mL/min (ref >60)
Glucose, Bld: 124 mg/dL — ABNORMAL HIGH (ref 70–99)
Potassium: 4.5 mmol/L (ref 3.5–5.1)
Sodium: 141 mmol/L (ref 135–145)
Total Bilirubin: 0.4 mg/dL (ref 0.3–1.2)
Total Protein: 6.9 g/dL (ref 6.5–8.1)

## 2021-01-19 LAB — CBC WITH DIFFERENTIAL/PLATELET
Abs Immature Granulocytes: 0.04 10*3/uL (ref 0.00–0.07)
Basophils Absolute: 0 10*3/uL (ref 0.0–0.1)
Basophils Relative: 1 %
Eosinophils Absolute: 0.2 10*3/uL (ref 0.0–0.5)
Eosinophils Relative: 3 %
HCT: 44.6 % (ref 39.0–52.0)
Hemoglobin: 14.5 g/dL (ref 13.0–17.0)
Immature Granulocytes: 1 %
Lymphocytes Relative: 38 %
Lymphs Abs: 2.3 10*3/uL (ref 0.7–4.0)
MCH: 28.3 pg (ref 26.0–34.0)
MCHC: 32.5 g/dL (ref 30.0–36.0)
MCV: 87.1 fL (ref 80.0–100.0)
Monocytes Absolute: 0.5 10*3/uL (ref 0.1–1.0)
Monocytes Relative: 9 %
Neutro Abs: 3 10*3/uL (ref 1.7–7.7)
Neutrophils Relative %: 48 %
Platelets: 216 10*3/uL (ref 150–400)
RBC: 5.12 MIL/uL (ref 4.22–5.81)
RDW: 16.1 % — ABNORMAL HIGH (ref 11.5–15.5)
WBC: 6.1 10*3/uL (ref 4.0–10.5)
nRBC: 0 % (ref 0.0–0.2)

## 2021-01-19 MED ORDER — SODIUM CHLORIDE 0.9 % IV BOLUS
1000.0000 mL | Freq: Once | INTRAVENOUS | Status: AC
  Administered 2021-01-19: 1000 mL via INTRAVENOUS

## 2021-01-19 NOTE — Discharge Instructions (Addendum)
You have been seen and discharged from the emergency department.  Your blood work and work-up here was reassuring.  It may be your Lasix dose that could be lowering your blood pressure and causing you to use too much fluid.  Decrease this dose to every other day.  Follow-up with your primary provider for reevaluation of blood pressure and medication management. Take home medications as prescribed. If you have any worsening symptoms or further concerns for your health please return to an emergency department for further evaluation.

## 2021-01-19 NOTE — ED Provider Notes (Addendum)
[facility] EMERGENCY DEPARTMENT Provider Note   CSN: [id number] Arrival date & time: 01/19/21  1154     History Chief Complaint  Patient presents with   Hypotension    Ricardo Howard is a 60 y.o. male.  HPI  60 year old male with past medical history of HTN, HLD, DM presents emergency department concern for low blood pressure.  Patient states over the past month his blood pressure has been trending on the low side.  He does take furosemide and hypertensive medications.  Patient states he went to urgent care yesterday.  They were able to fluid hydrate him, he felt better went home.  However this morning when he woke up and checked his blood pressure was low again so he called EMS.  Patient received a small amount of fluid prior to arrival, is normotensive on arrival.  Denies any associated symptoms including lightheadedness, chest pain, shortness of breath, recent illness, vomiting/diarrhea.  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   Pancreatitis 10/07/2019   Dehydration 10/07/2019   Hyperglycemia 10/07/2019   Occult blood in stools    Mixed hyperlipidemia due to type 2 diabetes mellitus (HCC) 07/31/2019   Uncontrolled type 2 diabetes mellitus with diabetic polyneuropathy, with long-term current use of insulin 07/31/2019   Nicotine dependence, cigarettes, uncomplicated 07/31/2019   Complaint of melena 07/31/2019   Pancreatic duct calculus 06/21/2019   Elevated  LFTs 06/21/2019   DKA (diabetic ketoacidoses) 06/14/2019   Acute pancreatitis 06/14/2019   Alcohol abuse 06/14/2019   Essential hypertension 06/14/2019    Past Surgical History:  Procedure Laterality Date   ABDOMINAL AORTOGRAM W/LOWER EXTREMITY N/A 12/02/2020   Procedure: ABDOMINAL AORTOGRAM W/LOWER EXTREMITY;  Surgeon: Leonie Douglas, MD;  Location: [facility] INVASIVE CV LAB;  Service: Cardiovascular;  Laterality: N/A;   AMPUTATION Right 03/13/2020   Procedure: TRANSMETATARSAL AMPUTATION;  Surgeon: Chuck Hint, MD;  Location: [facility] OR;  Service: Vascular;  Laterality: Right;   AMPUTATION Right 03/15/2020   Procedure: RIGHT BELOW KNEE AMPUTATION;  Surgeon: Chuck Hint, MD;  Location: [name] OR;  Service: Vascular;  Laterality: Right;   CORONARY ANGIOPLASTY WITH STENT PLACEMENT     PERIPHERAL VASCULAR INTERVENTION  12/02/2020   Procedure: PERIPHERAL VASCULAR INTERVENTION;  Surgeon: Leonie Douglas, MD;  Location: [facility] INVASIVE CV LAB;  Service: Cardiovascular;;  Lt SFA, LEIA       Family History  Problem Relation Age of Onset   Other Neg Hx     Social History   Tobacco Use   Smoking status: Every Day    Packs/day: 0.25    Types: Cigarettes   Smokeless tobacco: Never  Vaping Use   Vaping Use: Never used  Substance Use Topics   Alcohol use: Yes    Comment: etoh abuse   Drug use: Not Currently    Home Medications Prior to Admission medications   Medication Sig Start Date End Date Taking? Authorizing Provider  acetaminophen (TYLENOL) 325 MG tablet Take 2 tablets (650 mg total) by mouth  every 6 (six) hours as needed. Patient taking differently: Take 650 mg by mouth every 6 (six) hours as needed for mild pain or moderate pain. 10/26/20   Sloan Leiter, DO  amLODipine (NORVASC) 5 MG tablet Take 5 mg by mouth daily. 04/25/20   [provider]  aspirin 81 MG tablet Take 81 mg by mouth daily.    [provider]  atorvastatin (LIPITOR) 80 MG tablet Take 80  mg by mouth daily.    [provider]  buPROPion (WELLBUTRIN SR) 150 MG 12 hr tablet Take 150 mg by mouth in the morning and at bedtime.    [provider]  carvedilol (COREG) 25 MG tablet Take 25 mg by mouth 2 (two) times daily with a meal. 01/20/20   [provider]  clopidogrel (PLAVIX) 75 MG tablet Take 1 tablet (75 mg total) by mouth daily. 12/02/20 12/02/21  Leonie Douglas, MD  folic acid (FOLVITE) 1 MG tablet Take 1 tablet (1 mg total) by mouth daily. 10/11/19   Rodolph Bong, MD  furosemide (LASIX) 20 MG tablet Take 0.5 tablets by mouth daily. 10/10/20   [provider]  gabapentin (NEURONTIN) 300 MG capsule Take 300-600 mg by mouth See admin instructions. Take 300 mg morning, noon at 600 mg at bedtime    [provider]  hydrOXYzine (VISTARIL) 25 MG capsule Take 25 mg by mouth at bedtime as needed (Sleep). 01/07/20   [provider]  insulin aspart (NOVOLOG) 100 UNIT/ML FlexPen Inject 4 Units into the skin 3 (three) times daily with meals. Patient taking differently: Inject 0-35 Units into the skin See admin instructions. Per sliding scale with meals 10/10/19   Rodolph Bong, MD  insulin glargine (LANTUS) 100 UNIT/ML Solostar Pen Inject 16 Units into the skin daily. 10/10/19   Rodolph Bong, MD  insulin glargine-yfgn (SEMGLEE) 100 UNIT/ML Pen Inject 65 Units into the skin daily. 10/10/20   [provider]  Insulin Pen Needle (NOVOFINE) 30G X 8 MM MISC Inject 10 each into the skin as needed. 10/10/19   Rodolph Bong, MD  isosorbide mononitrate (IMDUR) 30 MG 24 hr tablet Take 30 mg by mouth daily. 01/20/20   [provider]  lipase/protease/amylase (CREON) 36000 UNITS CPEP capsule Take 1 capsule (36,000 Units total) by mouth 3 (three) times daily before meals. 06/17/19   Laverna Peace, MD  losartan (COZAAR) 100 MG tablet Take 50 mg by mouth daily. 04/25/20   [provider]  lurasidone (LATUDA) 20 MG  TABS tablet Take 40 mg by mouth daily.    [provider]  magnesium oxide (MAG-OX) 400 (241.3 Mg) MG tablet Take 1 tablet (400 mg total) by mouth 2 (two) times daily. 03/18/20   Burnadette Pop, MD  mesalamine (LIALDA) 1.2 g EC tablet Take 1,200 mg by mouth daily. 11/10/20   [provider]  metFORMIN (GLUCOPHAGE) 500 MG tablet Take 500 mg by mouth daily with breakfast.    [provider]  naproxen sodium (ALEVE) 220 MG tablet Take 440 mg by mouth 2 (two) times daily as needed (headache).    [provider]  nicotine (NICODERM CQ - DOSED IN MG/24 HOURS) 14 mg/24hr patch Place 14 mg onto the skin daily. 04/07/20   [provider]  pantoprazole (PROTONIX) 40 MG tablet Take 40 mg by mouth daily. 10/10/20   [provider]  polyethylene glycol (MIRALAX / GLYCOLAX) 17 g packet Take 17 g by mouth daily as needed  for mild constipation. 03/18/20   Burnadette Pop, MD  sertraline (ZOLOFT) 50 MG tablet Take 100 mg by mouth daily. 03/31/20   [provider]  tamsulosin (FLOMAX) 0.4 MG CAPS capsule Take 0.4 mg by mouth daily. 01/20/20   [provider]  thiamine 100 MG tablet Take 1 tablet (100 mg total) by mouth daily. 10/11/19   Rodolph Bong, MD  vitamin B-12 (CYANOCOBALAMIN) 500 MCG tablet Take 500 mcg by mouth daily. 01/20/20   [provider]    Allergies    Iohexol, Semaglutide, Lac bovis, Lisinopril, and Simvastatin  Review of Systems   Review of Systems  Constitutional:  Positive for fatigue. Negative for chills and fever.  HENT:  Negative for congestion.   Eyes:  Negative for visual disturbance.  Respiratory:  Negative for shortness of breath.   Cardiovascular:  Negative for chest pain.       + Hypotension  Gastrointestinal:  Negative for abdominal pain, diarrhea and vomiting.  Genitourinary:  Negative for dysuria.  Skin:  Negative for rash.  Neurological:  Negative for headaches.   Physical Exam Updated Vital  Signs BP 110/81   Pulse 90   Temp (!) 97.1 F (36.2 C) (Oral)   Resp 14   SpO2 92%   Physical Exam Vitals and nursing note reviewed.  Constitutional:      General: He is not in acute distress.    Appearance: Normal appearance. He is not ill-appearing, toxic-appearing or diaphoretic.  HENT:     Head: Normocephalic.     Mouth/Throat:     Mouth: Mucous membranes are moist.  Eyes:     Pupils: Pupils are equal, round, and reactive to light.  Cardiovascular:     Rate and Rhythm: Normal rate.  Pulmonary:     Effort: Pulmonary effort is normal. No respiratory distress.  Abdominal:     Palpations: Abdomen is soft.     Tenderness: There is no abdominal tenderness.  Skin:    General: Skin is warm.  Neurological:     Mental Status: He is alert and oriented to person, place, and time. Mental status is at baseline.  Psychiatric:        Mood and Affect: Mood normal.    ED Results / Procedures / Treatments   Labs (all labs ordered are listed, but only abnormal results are displayed) Labs Reviewed  CBC WITH DIFFERENTIAL/PLATELET - Abnormal; Notable for the following components:      Result Value   RDW 16.1 (*)    All other components within normal limits  COMPREHENSIVE METABOLIC PANEL - Abnormal; Notable for the following components:   Glucose, Bld 124 (*)    Albumin 3.4 (*)    All other components within normal limits    EKG EKG Interpretation  Date/Time:  Thursday January 19 2021 12:00:32 EST Ventricular Rate:  90 PR Interval:  131 QRS Duration: 90 QT Interval:  361 QTC Calculation: 442 R Axis:   79 Text Interpretation: Sinus rhythm Similar to previous Confirmed by Coralee Pesa [phone]) on 01/19/2021 12:41:35 PM  Radiology No results found.  Procedures Procedures   Medications Ordered in ED Medications  sodium chloride 0.9 % bolus 1,000 mL (has no administration in time range)    ED Course  I have reviewed the triage vital signs and the nursing  notes.  Pertinent labs & imaging results that were available during my care of the patient were reviewed by me and considered in my medical decision making (see chart for  details).    MDM Rules/Calculators/A&P                           60 year old male presents emergency department with report of hypotension.  Patient states his blood pressure has been downtrending over the past month, it was low yesterday.  He was treated with IV fluids which improved.  He had blood work done yesterday which included multiple troponins that were all negative.  He states has been compliant with his medications.  He has had a similar issue in the past secondary to Lasix dose. No syncope.  On arrival here patient is normotensive, received a small fluid bolus from EMS.  He has no other acute complaints including chest pain, shortness of breath, dysuria.  No recent nausea/vomiting/diarrhea.  He is otherwise been in his usual state of health.  Blood work here is reassuring without acute abnormalities.  No signs of sepsis.  EKG is unchanged for the patient.  After fluid hydration most recent SBP is 134.  He feels well.  Has been ambulatory without difficulty.  Plan for outpatient follow-up.  I advised him to decrease his Lasix dose to every other day until his blood pressure can be reevaluated by his primary doctor.  Final Clinical Impression(s) / ED Diagnoses Final diagnoses:  None    Rx / DC Orders ED Discharge Orders     None        Rozelle Logan, DO 01/19/21 1622    Kunta Hilleary, Clabe Seal, DO 01/19/21 1623

## 2021-01-19 NOTE — ED Triage Notes (Signed)
Pt BIB  [facility] from home d/t Increasing weakness, hypotension and headache over the past 1 month. Pt was treated at [facility] urgent care yesterday w fluids. However, same problem happened this morning, thus called 911. Hx stents placement& hypertension.   BP 96/62 , 100/67 after fluids  Cbg 143 HR 96 94% RA

## 2021-01-30 NOTE — Progress Notes (Signed)
[facility] [place] PROGRESS NOTE  ASSESSMENT / PLAN: Ricardo Howard is a 60 y.o. male status post left superficial femoral / above knee popliteal artery stenting (7x13mm Eluvia); left external iliac artery stenting (8x59mm Innova) for left lower extremity chronic limb threatening ischemia.   Recommend the following:  Complete cessation from all tobacco products. Blood glucose control with goal A1c < 7%. Blood pressure control with goal blood pressure < 140/90 mmHg. Lipid reduction therapy with goal LDL-C <100 mg/dL (<16 if symptomatic from PAD).  Aspirin 81mg  PO QD.  Atorvastatin 40-80mg  PO QD (or other "high intensity" statin therapy).  The patient's interdigital ulceration has resolved.  His third toe still has ischemic skin changes, but these are stable.  Suspect he will heal this completely.  Follow-up with me in 3 to 6 months with left lower extremity duplex and ABI  SUBJECTIVE: Doing well. Third toe dark. Interdigital ulceration continues. Many areas appear healed. Third toe has active ulceration and drainage.  OBJECTIVE: BP 109/72 (BP Location: Left Arm, Patient Position: Sitting, Cuff Size: Large)   Pulse (!) 116   Temp 98.5 F (36.9 C)   Resp 20   Ht 5\' 8"  (1.727 m)   Wt 242 lb (109.8 kg)   SpO2 96%   BMI 36.80 kg/m   Constitutional: well appearing. no acute distress. Cardiac: RRR. Pulmonary: unlabored Abdomen: soft Vascular: L foot with healed interdigital ulceration between toes 1/2, 4/5. Interdigital ulceration about third toe has healed. Third toe dusky.   CBC Latest Ref Rng & Units 01/19/2021 12/02/2020 10/26/2020  WBC 4.0 - 10.5 K/uL 6.1 - 6.9  Hemoglobin 13.0 - 17.0 g/dL 12/04/2020 12/26/2020 10.9  Hematocrit 39.0 - 52.0 % 44.6 47.0 46.6  Platelets 150 - 400 K/uL 216 - 183     CMP Latest Ref Rng & Units 01/19/2021 12/02/2020 10/26/2020  Glucose 70 - 99 mg/dL 12/04/2020) 12/26/2020) 981(X)  BUN 8 - 23 mg/dL 12 11 9   Creatinine 0.61 - 1.24 mg/dL 914(N  829(F)  Sodium 135 - 145 mmol/L 141 138 134(L)  Potassium 3.5 - 5.1 mmol/L 4.5 4.6 4.1  Chloride 98 - 111 mmol/L 110 104 103  CO2 22 - 32 mmol/L 22 - 23  Calcium 8.9 - 10.3 mg/dL 9.2 - 8.9  Total Protein 6.5 - 8.1 g/dL 6.9 - -  Total Bilirubin 0.3 - 1.2 mg/dL 0.4 - -  Alkaline Phos 38 - 126 U/L 90 - -  AST 15 - 41 U/L 25 - -  ALT 0 - 44 U/L 28 - -    CrCl cannot be calculated (Unknown ideal weight.).   +-------+-----------+-----------+------------+------------+  ABI/TBIToday's ABIToday's TBIPrevious ABIPrevious TBI  +-------+-----------+-----------+------------+------------+  Right  BKA                   BKA                       +-------+-----------+-----------+------------+------------+  Left   1.12       0.78       0.70        0.31          +-------+-----------+-----------+------------+------------+   Arterial duplex:  Patent stent with no evidence of stenosis in the Distal left SFA  artery. Distal left external iliac artery stent appears to be patent.   6.21. 3.08(M, MD [facility] Phone Number: [phone] 01/30/2021 6:31 PM

## 2021-01-31 ENCOUNTER — Other Ambulatory Visit: Payer: Self-pay

## 2021-01-31 ENCOUNTER — Ambulatory Visit (INDEPENDENT_AMBULATORY_CARE_PROVIDER_SITE_OTHER): Payer: No Typology Code available for payment source | Admitting: Vascular Surgery

## 2021-01-31 ENCOUNTER — Encounter: Payer: Self-pay | Admitting: Vascular Surgery

## 2021-01-31 VITALS — BP 109/72 | HR 116 | Temp 98.5°F | Resp 20 | Ht 68.0 in | Wt 242.0 lb

## 2021-01-31 DIAGNOSIS — I739 Peripheral vascular disease, unspecified: Secondary | ICD-10-CM | POA: Diagnosis not present

## 2021-02-01 ENCOUNTER — Other Ambulatory Visit: Payer: Self-pay

## 2021-02-01 DIAGNOSIS — I739 Peripheral vascular disease, unspecified: Secondary | ICD-10-CM

## 2021-02-03 ENCOUNTER — Ambulatory Visit (HOSPITAL_BASED_OUTPATIENT_CLINIC_OR_DEPARTMENT_OTHER): Payer: No Typology Code available for payment source | Admitting: Pulmonary Disease

## 2021-02-22 ENCOUNTER — Telehealth: Payer: Self-pay

## 2021-02-22 ENCOUNTER — Ambulatory Visit: Payer: No Typology Code available for payment source | Admitting: Dietician

## 2021-02-22 NOTE — Telephone Encounter (Signed)
Patient is s/p R BKA, and has ulcerations on LLE. French Ana from [facility] calls today to report that patient is having difficulty getting around and would like to be re-evaluated by PT/OT. Gave verbal okay.

## 2021-03-08 ENCOUNTER — Ambulatory Visit: Payer: No Typology Code available for payment source | Admitting: Pulmonary Disease

## 2021-03-09 ENCOUNTER — Ambulatory Visit: Payer: No Typology Code available for payment source | Admitting: Pulmonary Disease

## 2021-03-10 ENCOUNTER — Ambulatory Visit: Payer: No Typology Code available for payment source | Admitting: Nurse Practitioner

## 2021-03-17 ENCOUNTER — Ambulatory Visit (HOSPITAL_BASED_OUTPATIENT_CLINIC_OR_DEPARTMENT_OTHER): Payer: No Typology Code available for payment source | Attending: Pulmonary Disease | Admitting: Pulmonary Disease

## 2021-03-17 ENCOUNTER — Other Ambulatory Visit: Payer: Self-pay

## 2021-03-17 VITALS — Ht 68.0 in | Wt 240.0 lb

## 2021-03-17 DIAGNOSIS — G4733 Obstructive sleep apnea (adult) (pediatric): Secondary | ICD-10-CM | POA: Insufficient documentation

## 2021-03-18 ENCOUNTER — Telehealth: Payer: Self-pay | Admitting: Pulmonary Disease

## 2021-03-18 DIAGNOSIS — G4733 Obstructive sleep apnea (adult) (pediatric): Secondary | ICD-10-CM

## 2021-03-18 NOTE — Procedures (Signed)
POLYSOMNOGRAPHY  Last, First: Ricardo, Howard MRN: [id number] Gender: Male Age (years): 88 Weight (lbs): 240 DOB: 11/21/1960 BMI: 36 Primary Care: No PCP Epworth Score: 15 Referring: Laurin Coder MD Technician: Baxter Flattery Interpreting: Laurin Coder MD Study Type: Split Night CPAP Ordered Study Type: Split Night CPAP Study date: 03/17/2021 Location: [place] CLINICAL INFORMATION Ricardo Howard is a 61 year old Male and was referred to the sleep center for evaluation of G47.80 Other Sleep Disorders. Indications include Fatigue, Hypertension, Snoring, Witnessed Apneas.  MEDICATIONS Patient self administered medications include: N/A. Medications administered during study include No sleep medicine administered.  SLEEP STUDY TECHNIQUE The patient underwent an attended overnight level one polysomnography titration to assess the effects of CPAP therapy. The following variables were monitored: EEG (C4-A1, C3-A2, O1-A2, O2-A1), EOG, submental and leg EMG, ECG, oxyhemoglobin saturation by pulse oximetry, thoracic and abdominal respiratory effort belts, nasal/oral airflow by pressure sensor, body position sensor and snoring sensor. CPAP pressure was titrated to eliminate apneas, hypopneas and oxygen desaturation. Hypopneas were scored per AASM definition IB (4% desaturation)  The NPSG portion of the study ended at 2:18:12 AM . The CPAP titration was initiated at 2:20:43 AM AM with the CPAP portion of the study ending at 4:38:31 AM.  TECHNICIAN COMMENTS Comments added by Technician: Patient had difficulty initiating sleep. Comments added by Scorer: N/A SLEEP ARCHITECTURE The recording time for the entire night was 407.1 minutes. The diagnostic portion was initiated at 9:51:23 PM and terminated at 2:18:12 AM. The time in bed was 266.8 minutes. EEG confirmed total sleep time was 98.5 minutes yielding a sleep efficiency of 36.9%%. Sleep onset after lights out was 136.8 minutes with a REM  latency of N/A minutes. The patient spent 6.1%% of the night in stage N1 sleep, 93.9%% in stage N2 sleep, 0.0%% in stage N3 and 0% in REM. The Arousal Index was 11.0/hour.  The titration portion was initiated at 2:20:43 AM and terminated at 4:38:31 AM. The time in bed was 137.8 minutes. EEG confirmed total sleep time was 87.5 minutes yielding a sleep efficiency of 63.5%%. Sleep onset after CPAP initiation was 23.5 minutes with a REM latency of 106.5 minutes. The patient spent 4.6%% of the night in stage N1 sleep, 87.4%% in stage N2 sleep, 0.0%% in stage N3 and 8% in REM. The Arousal Index was 9.6/hour. RESPIRATORY PARAMETERS During the diagnostic portion, there were a total of 90 respiratory disturbances recorded; 36 apneas ( 16 obstructive, 17 mixed, 3 central), 54 hypopneas and 0 RERAs. The apnea/hypopnea index 54.8 was events/hour and the RDI was 54.8 events/hour. The central sleep apnea index was 1.8 events/hour. The REM AHI was N/A /h and NREM AHI was 54.8/h. The REM RDI was 0.0 /h and NREM RDI was 54.8 /h. The supine AHI was N/A/h, and the non supine AHI was 54.8/h; supine during 0.0%% of sleep. The supine RDI was 0.0/h, and the non supine RDI was 54.82/h. Respiratory disturbances were associated with oxygen desaturation down to a nadir of 78.0 % during sleep. The mean oxygen saturation during the study was 89.3%. The cumulative time under 88% oxygen saturation was 29.5 minutes.  During the titration portion, the apnea/hypopnea index (AHI) was 32.9 events/hour and the RDI was 32.9 events/hour. The central sleep apnea index was events/hour. The most appropriate setting of CPAP was IPAP/EPAP 13/13 cm H2O. At this setting, the sleep efficiency was 71% and the patient was supine for 100%. The AHI was 4.8 events per hour(with 0 central events). Oxygen  nadir was 87.0. LEG MOVEMENT DATA The periodic limb movement index was 0.0/hour with an associated arousal index of /hour. CARDIAC DATA The underlying  cardiac rhythm was most consistent with sinus rhythm. Mean heart rate was 81.0 during diagnostic portion and 84.2 during titration portion of study. Additional rhythm abnormalities include None.  IMPRESSIONS - Severe Obstructive Sleep apnea(OSA) Optimal pressure attained. - EKG showed no cardiac abnormalities. - No Significant Central Sleep Apnea (CSA) - Moderate oxygen Desaturation - No snoring was audible during this study. - EEG did not show alpha intrusion. - No significant periodic leg movements(PLMs) during sleep. However, no significant associated arousals. [name] reduced sleep efficiency, long primary sleep latency, long REM sleep latency and no slow wave latency.  DIAGNOSIS - Obstructive Sleep Apnea (G47.33)  RECOMMENDATIONS - Trial of CPAP therapy on 13 cm H2O with a Large size Fisher&Paykel Full Face Mask Simplus mask and heated humidification. - Avoid alcohol, sedatives and other CNS depressants that may worsen sleep apnea and disrupt normal sleep architecture. - Sleep hygiene should be reviewed to assess factors that may improve sleep quality. - Weight management and regular exercise should be initiated or continued. - Return to Sleep Center for re-evaluation after 4 weeks of therapy  [Electronically signed] 03/18/2021 01:34 PM  Sherrilyn Rist MD NPI: [id number]

## 2021-03-18 NOTE — Telephone Encounter (Signed)
Call patient  Sleep study result  Date of study: 03/17/2021  Impression: Severe obstructive sleep apnea Moderate oxygen desaturations  Recommendation: DME referral  Recommend CPAP therapy for moderate obstructive sleep apnea  Trial of CPAP therapy on 13 cm H2O with a Large size Fisher&Paykel Full Face Mask Simplus mask and heated humidification.  Encourage weight loss measures  Follow-up in the office 4 to 6 weeks following initiation of treatment

## 2021-03-21 NOTE — Telephone Encounter (Signed)
I called the patient and spoke with his wife and she voices understanding. He wants the CPAP placed. I have placed an order for CPAP and they are going to call back for a follow up 4-6 weeks after CPAP set up.

## 2021-05-01 NOTE — Progress Notes (Unsigned)
[facility] [place] PROGRESS NOTE  ASSESSMENT / PLAN: Ricardo Howard is a 62 y.o. male status post left superficial femoral / above knee popliteal artery stenting (7x130mm Eluvia); left external iliac artery stenting (8x16mm Innova) for left lower extremity chronic limb threatening ischemia.   Recommend the following:  Complete cessation from all tobacco products. Blood glucose control with goal A1c < 7%. Blood pressure control with goal blood pressure < 140/90 mmHg. Lipid reduction therapy with goal LDL-C <100 mg/dL (<17 if symptomatic from PAD).  Aspirin 81mg  PO QD.  Atorvastatin 40-80mg  PO QD (or other "high intensity" statin therapy).  The patient's interdigital ulceration has resolved.  His third toe still has ischemic skin changes, but these are stable.  Suspect he will heal this completely.  Follow-up with me in 3 to 6 months with left lower extremity duplex and ABI  SUBJECTIVE: Doing well. Third toe dark. Interdigital ulceration continues. Many areas appear healed. Third toe has active ulceration and drainage.  OBJECTIVE: There were no vitals taken for this visit.  Constitutional: well appearing. no acute distress. Cardiac: RRR. Pulmonary: unlabored Abdomen: soft Vascular: L foot with healed interdigital ulceration between toes 1/2, 4/5. Interdigital ulceration about third toe has healed. Third toe dusky.   CBC Latest Ref Rng & Units 01/19/2021 12/02/2020 10/26/2020  WBC 4.0 - 10.5 K/uL 6.1 - 6.9  Hemoglobin 13.0 - 17.0 g/dL 12/26/2020 79.3 90.3  Hematocrit 39.0 - 52.0 % 44.6 47.0 46.6  Platelets 150 - 400 K/uL 216 - 183     CMP Latest Ref Rng & Units 01/19/2021 12/02/2020 10/26/2020  Glucose 70 - 99 mg/dL 12/26/2020) 233(A) 076(A)  BUN 8 - 23 mg/dL 12 11 9   Creatinine 0.61 - 1.24 mg/dL 263(F ) 3.54  Sodium 135 - 145 mmol/L 141 138 134(L)  Potassium 3.5 - 5.1 mmol/L 4.5 4.6 4.1  Chloride 98 - 111 mmol/L 110 104 103  CO2 22 - 32 mmol/L 22 - 23  Calcium  8.9 - 10.3 mg/dL 9.2 - 8.9  Total Protein 6.5 - 8.1 g/dL 6.9 - -  Total Bilirubin 0.3 - 1.2 mg/dL 0.4 - -  Alkaline Phos 38 - 126 U/L 90 - -  AST 15 - 41 U/L 25 - -  ALT 0 - 44 U/L 28 - -    CrCl cannot be calculated (Patient's most recent lab result is older than the maximum 21 days allowed.).   +-------+-----------+-----------+------------+------------+   ABI/TBI Today's ABI Today's TBI Previous ABI Previous TBI   +-------+-----------+-----------+------------+------------+   Right   BKA                     BKA                         +-------+-----------+-----------+------------+------------+   Left    1.12        0.78        0.70         0.31           +-------+-----------+-----------+------------+------------+   Arterial duplex:  Patent stent with no evidence of stenosis in the Distal left SFA  artery. Distal left external iliac artery stent appears to be patent.   5.62(B. 6.38, MD [facility] Phone Number: [phone] 05/01/2021 9:11 PM

## 2021-05-02 ENCOUNTER — Other Ambulatory Visit: Payer: Self-pay

## 2021-05-02 ENCOUNTER — Ambulatory Visit (INDEPENDENT_AMBULATORY_CARE_PROVIDER_SITE_OTHER)
Admission: RE | Admit: 2021-05-02 | Discharge: 2021-05-02 | Disposition: A | Discharge status: Home / Self Care | Payer: No Typology Code available for payment source | Source: Ambulatory Visit | Attending: Vascular Surgery | Admitting: Vascular Surgery

## 2021-05-02 ENCOUNTER — Ambulatory Visit (HOSPITAL_COMMUNITY)
Admission: RE | Admit: 2021-05-02 | Discharge: 2021-05-02 | Disposition: A | Discharge status: Home / Self Care | Payer: No Typology Code available for payment source | Source: Ambulatory Visit | Attending: Vascular Surgery | Admitting: Vascular Surgery

## 2021-05-02 ENCOUNTER — Encounter: Payer: Self-pay | Admitting: Vascular Surgery

## 2021-05-02 ENCOUNTER — Ambulatory Visit (INDEPENDENT_AMBULATORY_CARE_PROVIDER_SITE_OTHER): Payer: No Typology Code available for payment source | Admitting: Vascular Surgery

## 2021-05-02 VITALS — BP 128/89 | HR 91 | Temp 98.5°F | Resp 20 | Ht 68.0 in | Wt 240.0 lb

## 2021-05-02 DIAGNOSIS — I739 Peripheral vascular disease, unspecified: Secondary | ICD-10-CM

## 2021-05-05 ENCOUNTER — Other Ambulatory Visit: Payer: Self-pay | Admitting: *Deleted

## 2021-05-05 DIAGNOSIS — I739 Peripheral vascular disease, unspecified: Secondary | ICD-10-CM

## 2021-05-30 ENCOUNTER — Inpatient Hospital Stay (HOSPITAL_COMMUNITY)
Admission: EM | Admit: 2021-05-30 | Discharge: 2021-06-03 | DRG: 871 | Disposition: A | Discharge status: Home Health | Payer: No Typology Code available for payment source | Attending: Internal Medicine | Admitting: Internal Medicine

## 2021-05-30 ENCOUNTER — Other Ambulatory Visit: Payer: Self-pay

## 2021-05-30 ENCOUNTER — Emergency Department (HOSPITAL_COMMUNITY): Payer: No Typology Code available for payment source

## 2021-05-30 DIAGNOSIS — F17210 Nicotine dependence, cigarettes, uncomplicated: Secondary | ICD-10-CM | POA: Diagnosis present

## 2021-05-30 DIAGNOSIS — E1110 Type 2 diabetes mellitus with ketoacidosis without coma: Secondary | ICD-10-CM | POA: Diagnosis not present

## 2021-05-30 DIAGNOSIS — E782 Mixed hyperlipidemia: Secondary | ICD-10-CM | POA: Diagnosis present

## 2021-05-30 DIAGNOSIS — Z8249 Family history of ischemic heart disease and other diseases of the circulatory system: Secondary | ICD-10-CM

## 2021-05-30 DIAGNOSIS — Z955 Presence of coronary angioplasty implant and graft: Secondary | ICD-10-CM

## 2021-05-30 DIAGNOSIS — E876 Hypokalemia: Secondary | ICD-10-CM | POA: Diagnosis present

## 2021-05-30 DIAGNOSIS — J181 Lobar pneumonia, unspecified organism: Secondary | ICD-10-CM | POA: Diagnosis present

## 2021-05-30 DIAGNOSIS — N179 Acute kidney failure, unspecified: Secondary | ICD-10-CM | POA: Diagnosis present

## 2021-05-30 DIAGNOSIS — Z833 Family history of diabetes mellitus: Secondary | ICD-10-CM

## 2021-05-30 DIAGNOSIS — A419 Sepsis, unspecified organism: Principal | ICD-10-CM | POA: Diagnosis present

## 2021-05-30 DIAGNOSIS — I110 Hypertensive heart disease with heart failure: Secondary | ICD-10-CM | POA: Diagnosis present

## 2021-05-30 DIAGNOSIS — E1169 Type 2 diabetes mellitus with other specified complication: Secondary | ICD-10-CM | POA: Diagnosis present

## 2021-05-30 DIAGNOSIS — J189 Pneumonia, unspecified organism: Principal | ICD-10-CM

## 2021-05-30 DIAGNOSIS — Z20822 Contact with and (suspected) exposure to covid-19: Secondary | ICD-10-CM | POA: Diagnosis present

## 2021-05-30 DIAGNOSIS — I5042 Chronic combined systolic (congestive) and diastolic (congestive) heart failure: Secondary | ICD-10-CM | POA: Diagnosis present

## 2021-05-30 DIAGNOSIS — Z7984 Long term (current) use of oral hypoglycemic drugs: Secondary | ICD-10-CM

## 2021-05-30 DIAGNOSIS — E1151 Type 2 diabetes mellitus with diabetic peripheral angiopathy without gangrene: Secondary | ICD-10-CM | POA: Diagnosis present

## 2021-05-30 DIAGNOSIS — R627 Adult failure to thrive: Secondary | ICD-10-CM | POA: Diagnosis present

## 2021-05-30 DIAGNOSIS — Z7982 Long term (current) use of aspirin: Secondary | ICD-10-CM

## 2021-05-30 DIAGNOSIS — Z91048 Other nonmedicinal substance allergy status: Secondary | ICD-10-CM

## 2021-05-30 DIAGNOSIS — E1142 Type 2 diabetes mellitus with diabetic polyneuropathy: Secondary | ICD-10-CM | POA: Diagnosis present

## 2021-05-30 DIAGNOSIS — Z79899 Other long term (current) drug therapy: Secondary | ICD-10-CM

## 2021-05-30 DIAGNOSIS — I2510 Atherosclerotic heart disease of native coronary artery without angina pectoris: Secondary | ICD-10-CM | POA: Diagnosis present

## 2021-05-30 DIAGNOSIS — Z89511 Acquired absence of right leg below knee: Secondary | ICD-10-CM

## 2021-05-30 DIAGNOSIS — Z794 Long term (current) use of insulin: Secondary | ICD-10-CM

## 2021-05-30 DIAGNOSIS — Z6841 Body Mass Index (BMI) 40.0 and over, adult: Secondary | ICD-10-CM

## 2021-05-30 LAB — CBC WITH DIFFERENTIAL/PLATELET
Abs Immature Granulocytes: 0.09 10*3/uL — ABNORMAL HIGH (ref 0.00–0.07)
Basophils Absolute: 0 10*3/uL (ref 0.0–0.1)
Basophils Relative: 0 %
Eosinophils Absolute: 0 10*3/uL (ref 0.0–0.5)
Eosinophils Relative: 0 %
HCT: 51.6 % (ref 39.0–52.0)
Hemoglobin: 16.9 g/dL (ref 13.0–17.0)
Immature Granulocytes: 1 %
Lymphocytes Relative: 5 %
Lymphs Abs: 0.5 10*3/uL — ABNORMAL LOW (ref 0.7–4.0)
MCH: 27.7 pg (ref 26.0–34.0)
MCHC: 32.8 g/dL (ref 30.0–36.0)
MCV: 84.6 fL (ref 80.0–100.0)
Monocytes Absolute: 0.9 10*3/uL (ref 0.1–1.0)
Monocytes Relative: 8 %
Neutro Abs: 10 10*3/uL — ABNORMAL HIGH (ref 1.7–7.7)
Neutrophils Relative %: 86 %
Platelets: 191 10*3/uL (ref 150–400)
RBC: 6.1 MIL/uL — ABNORMAL HIGH (ref 4.22–5.81)
RDW: 18.9 % — ABNORMAL HIGH (ref 11.5–15.5)
WBC: 11.5 10*3/uL — ABNORMAL HIGH (ref 4.0–10.5)
nRBC: 0 % (ref 0.0–0.2)

## 2021-05-30 LAB — COMPREHENSIVE METABOLIC PANEL
ALT: 17 U/L (ref 0–44)
AST: 22 U/L (ref 15–41)
Albumin: 3.1 g/dL — ABNORMAL LOW (ref 3.5–5.0)
Alkaline Phosphatase: 93 U/L (ref 38–126)
Anion gap: 20 — ABNORMAL HIGH (ref 5–15)
BUN: 20 mg/dL (ref 8–23)
CO2: 14 mmol/L — ABNORMAL LOW (ref 22–32)
Calcium: 8.8 mg/dL — ABNORMAL LOW (ref 8.9–10.3)
Chloride: 98 mmol/L (ref 98–111)
Creatinine, Ser: 1.22 mg/dL (ref 0.61–1.24)
GFR, Estimated: >60 mL/min (ref >60)
Glucose, Bld: 217 mg/dL — ABNORMAL HIGH (ref 70–99)
Potassium: 4.4 mmol/L (ref 3.5–5.1)
Sodium: 132 mmol/L — ABNORMAL LOW (ref 135–145)
Total Bilirubin: 2.2 mg/dL — ABNORMAL HIGH (ref 0.3–1.2)
Total Protein: 8.4 g/dL — ABNORMAL HIGH (ref 6.5–8.1)

## 2021-05-30 LAB — TROPONIN I (HIGH SENSITIVITY): Troponin I (High Sensitivity): 27 ng/L — ABNORMAL HIGH (ref <18)

## 2021-05-30 LAB — LACTIC ACID, PLASMA: Lactic Acid, Venous: 2 mmol/L (ref 0.5–1.9)

## 2021-05-30 LAB — RESP PANEL BY RT-PCR (FLU A&B, COVID) ARPGX2
Influenza A by PCR: NEGATIVE
Influenza B by PCR: NEGATIVE
SARS Coronavirus 2 by RT PCR: NEGATIVE

## 2021-05-30 LAB — BRAIN NATRIURETIC PEPTIDE: B Natriuretic Peptide: 67.4 pg/mL (ref 0.0–100.0)

## 2021-05-30 MED ORDER — SODIUM CHLORIDE 0.9 % IV SOLN
1.0000 g | Freq: Once | INTRAVENOUS | Status: AC
Start: 2021-05-31 — End: 2021-05-31
  Administered 2021-05-31: 1 g via INTRAVENOUS
  Filled 2021-05-30: qty 10

## 2021-05-30 MED ORDER — AZITHROMYCIN 250 MG PO TABS: 500.0000 mg | ORAL_TABLET | Freq: Every day | ORAL | Status: DC

## 2021-05-30 MED ORDER — LACTATED RINGERS IV BOLUS
1000.0000 mL | Freq: Once | INTRAVENOUS | Status: AC
  Administered 2021-05-31: 1000 mL via INTRAVENOUS

## 2021-05-30 NOTE — ED Triage Notes (Signed)
3 days ago pt began having dizziness when standing, central sharp non radiating chest pain and shortness of breathe. No aggravating factors. Pt feels like this all the time.denies weakness. ?

## 2021-05-30 NOTE — ED Provider Notes (Signed)
?[facility] ?Provider Note ? ? ?CSN: [id number] ?Arrival date & time: 05/30/21  1941 ? ?  ? ?History ? ?Chief Complaint  ?Patient presents with  ? Dizziness  ? Chest Pain  ?  3 days ago pt began having dizziness when standing, central sharp non radiating chest pain and shortness of breathe. No aggravating factors. Pt feels like this all the time.denies weakness  ? ? ?Ricardo DrainLee Allen Howard is a 61 y.o. male. ? ?The history is provided by the patient and medical records.  ?Dizziness ?Associated symptoms: chest pain   ?Chest Pain ?Associated symptoms: dizziness   ?Ricardo DrainLee Allen Howard is a 61 y.o. male who presents to the Emergency Department complaining of CP/SOB.  He started feeling poorly 4 to 5 days ago with gradual onset headache.  About 2 days ago he developed what he describes as chest pain with shortness of breath.  He points to the epigastric region when he describes this chest pain and states that it is throughout this area.  It is sharp in nature and comes and goes.  It does feel better with water.  He also reports fever for 2 days.  He has a cough.  No nausea, vomiting, diarrhea, dysuria.  He does use tobacco and drinks 1-2 beers daily.  No drug use.  He has a history of hypertension, hyperlipidemia, diabetes, right lower extremity BKA.  No history of DVT/PE, cardiac disease. ? ?  ? ?Home Medications ?Prior to Admission medications   ?Medication Sig Start Date End Date Taking? Authorizing Provider  ?acetaminophen (TYLENOL) 325 MG tablet Take 2 tablets (650 mg total) by mouth every 6 (six) hours as needed. ?Patient taking differently: Take 650 mg by mouth every 6 (six) hours as needed for mild pain or moderate pain. 10/26/20   Sloan LeiterGray, Samuel A, DO  ?amLODipine (NORVASC) 5 MG tablet Take 5 mg by mouth daily. 04/25/20   [provider]  ?aspirin 81 MG tablet Take 81 mg by mouth daily.    [provider]  ?atorvastatin (LIPITOR) 80 MG tablet Take 80 mg by mouth daily.     [provider]  ?buPROPion (WELLBUTRIN SR) 150 MG 12 hr tablet Take 150 mg by mouth in the morning and at bedtime.    [provider]  ?carvedilol (COREG) 25 MG tablet Take 25 mg by mouth 2 (two) times daily with a meal. 01/20/20   [provider]  ?clopidogrel (PLAVIX) 75 MG tablet Take 1 tablet (75 mg total) by mouth daily. 12/02/20 12/02/21  Leonie DouglasHawken, Thomas N, MD  ?folic acid (FOLVITE) 1 MG tablet Take 1 tablet (1 mg total) by mouth daily. 10/11/19   Rodolph Bonghompson, Daniel V, MD  ?furosemide (LASIX) 20 MG tablet Take 0.5 tablets by mouth daily. 10/10/20   [provider]  ?gabapentin (NEURONTIN) 300 MG capsule Take 300-600 mg by mouth See admin instructions. Take 300 mg morning, noon at 600 mg at bedtime    [provider]  ?hydrOXYzine (VISTARIL) 25 MG capsule Take 25 mg by mouth at bedtime as needed (Sleep). 01/07/20   [provider]  ?insulin aspart (NOVOLOG) 100 UNIT/ML FlexPen Inject 4 Units into the skin 3 (three) times daily with meals. ?Patient taking differently: Inject 0-35 Units into the skin See admin instructions. Per sliding scale with meals 10/10/19   Rodolph Bonghompson, Daniel V, MD  ?insulin glargine (LANTUS) 100 UNIT/ML Solostar Pen Inject 16 Units into the skin daily. 10/10/19   Rodolph Bonghompson, Daniel V, MD  ?  insulin glargine-yfgn (SEMGLEE) 100 UNIT/ML Pen Inject 65 Units into the skin daily. 10/10/20   [provider]  ?Insulin Pen Needle (NOVOFINE) 30G X 8 MM MISC Inject 10 each into the skin as needed. 10/10/19   Rodolph Bong, MD  ?isosorbide mononitrate (IMDUR) 30 MG 24 hr tablet Take 30 mg by mouth daily. 01/20/20   [provider]  ?lipase/protease/amylase (CREON) 36000 UNITS CPEP capsule Take 1 capsule (36,000 Units total) by mouth 3 (three) times daily before meals. 06/17/19   Laverna Peace, MD  ?losartan (COZAAR) 100 MG tablet Take 50 mg by mouth daily. 04/25/20   [provider]  ?lurasidone (LATUDA) 20 MG TABS tablet Take 40  mg by mouth daily.    [provider]  ?magnesium oxide (MAG-OX) 400 (241.3 Mg) MG tablet Take 1 tablet (400 mg total) by mouth 2 (two) times daily. 03/18/20   Burnadette Pop, MD  ?mesalamine (LIALDA) 1.2 g EC tablet Take 1,200 mg by mouth daily. 11/10/20   [provider]  ?metFORMIN (GLUCOPHAGE) 500 MG tablet Take 500 mg by mouth daily with breakfast.    [provider]  ?naproxen sodium (ALEVE) 220 MG tablet Take 440 mg by mouth 2 (two) times daily as needed (headache).    [provider]  ?nicotine (NICODERM CQ - DOSED IN MG/24 HOURS) 14 mg/24hr patch Place 14 mg onto the skin daily. 04/07/20   [provider]  ?pantoprazole (PROTONIX) 40 MG tablet Take 40 mg by mouth daily. 10/10/20   [provider]  ?polyethylene glycol (MIRALAX / GLYCOLAX) 17 g packet Take 17 g by mouth daily as needed for mild constipation. 03/18/20   Burnadette Pop, MD  ?sertraline (ZOLOFT) 50 MG tablet Take 100 mg by mouth daily. 03/31/20   [provider]  ?tamsulosin (FLOMAX) 0.4 MG CAPS capsule Take 0.4 mg by mouth daily. 01/20/20   [provider]  ?thiamine 100 MG tablet Take 1 tablet (100 mg total) by mouth daily. 10/11/19   Rodolph Bong, MD  ?vitamin B-12 (CYANOCOBALAMIN) 500 MCG tablet Take 500 mcg by mouth daily. 01/20/20   [provider]  ?   ? ?Allergies    ?Iohexol, Semaglutide, Lac bovis, Lisinopril, and Simvastatin   ? ?Review of Systems   ?Review of Systems  ?Cardiovascular:  Positive for chest pain.  ?Neurological:  Positive for dizziness.  ?All other systems reviewed and are negative. ? ?Physical Exam ?Updated Vital Signs ?BP 127/79   Pulse (!) 107   Temp 100.2 ?F (37.9 ?C) (Oral)   Resp 18   Ht 5\' 8"  (1.727 m)   Wt 120.2 kg   SpO2 96%   BMI 40.29 kg/m?  ?Physical Exam ?Vitals and nursing note reviewed.  ?Constitutional:   ?   Appearance: He is well-developed.  ?HENT:  ?   Head: Normocephalic and atraumatic.  ?Cardiovascular:  ?   Rate  and Rhythm: Regular rhythm. Tachycardia present.  ?   Heart sounds: No murmur heard. ?Pulmonary:  ?   Effort: Pulmonary effort is normal. No respiratory distress.  ?   Breath sounds: Normal breath sounds.  ?   Comments: tachypnea ?Abdominal:  ?   Palpations: Abdomen is soft.  ?   Tenderness: There is no abdominal tenderness. There is no guarding or rebound.  ?Musculoskeletal:     ?   General: No tenderness.  ?   Comments: Right lower extremity BKA.  LLE with dry skin, no open wounds, no edema  ?Skin: ?  General: Skin is warm and dry.  ?Neurological:  ?   Mental Status: He is alert and oriented to person, place, and time.  ?Psychiatric:     ?   Behavior: Behavior normal.  ? ? ?ED Results / Procedures / Treatments   ?Labs ?(all labs ordered are listed, but only abnormal results are displayed) ?Labs Reviewed  ?COMPREHENSIVE METABOLIC PANEL - Abnormal; Notable for the following components:  ?    Result Value  ? Sodium 132 (*)   ? CO2 14 (*)   ? Glucose, Bld 217 (*)   ? Calcium 8.8 (*)   ? Total Protein 8.4 (*)   ? Albumin 3.1 (*)   ? Total Bilirubin 2.2 (*)   ? Anion gap 20 (*)   ? All other components within normal limits  ?CBC WITH DIFFERENTIAL/PLATELET - Abnormal; Notable for the following components:  ? WBC 11.5 (*)   ? RBC 6.10 (*)   ? RDW 18.9 (*)   ? Neutro Abs 10.0 (*)   ? Lymphs Abs 0.5 (*)   ? Abs Immature Granulocytes 0.09 (*)   ? All other components within normal limits  ?LACTIC ACID, PLASMA - Abnormal; Notable for the following components:  ? Lactic Acid, Venous 2.0 (*)   ? All other components within normal limits  ?LACTIC ACID, PLASMA - Abnormal; Notable for the following components:  ? Lactic Acid, Venous 2.6 (*)   ? All other components within normal limits  ?URINALYSIS, ROUTINE W REFLEX MICROSCOPIC - Abnormal; Notable for the following components:  ? Glucose, UA >=500 (*)   ? Hgb urine dipstick MODERATE (*)   ? Ketones, ur 80 (*)   ? Protein, ur 100 (*)   ? All other components within normal limits   ?BETA-HYDROXYBUTYRIC ACID - Abnormal; Notable for the following components:  ? Beta-Hydroxybutyric Acid 5.78 (*)   ? All other components within normal limits  ?BASIC METABOLIC PANEL - Abnormal; Notable f

## 2021-05-30 NOTE — ED Provider Triage Note (Signed)
Emergency Medicine Provider Triage Evaluation Note ? ?Ricardo Howard , a 61 y.o. male  was evaluated in triage.  Pt complains of 3 days of chest pain and shortness of breath. ?He reports he had fever this morning.  He reports cough.  He denies any leg swelling.  Pain is in the middle of his chest.  Does not radiate or move.  He reports mild headache.  He denies any weakness or numbness. ? ? ?Physical Exam  ?BP (!) 105/94 (BP Location: Right Arm)   Pulse (!) 139   Temp 98.3 ?F (36.8 ?C) (Oral)   Resp (!) 22   Ht 5\' 8"  (1.727 m)   Wt 120.2 kg   SpO2 95%   BMI 40.29 kg/m?  ?Gen:   Awake, no distress   ?Resp:  Increased effort rhonchi bilaterally.  ?MSK:   Moves extremities without difficulty  ?Other:  Tachycardic.  Rhythm is regular. ? ?Medical Decision Making  ?Medically screening exam initiated at 8:14 PM.  Appropriate orders placed.  was informed that the remainder of the evaluation will be completed by another provider, this initial triage assessment does not replace that evaluation, and the importance of remaining in the ED until their evaluation is complete. ? ? ?  ?Ricardo Drain, PA-C ?05/30/21 2016 ? ?

## 2021-05-31 ENCOUNTER — Emergency Department (HOSPITAL_COMMUNITY): Payer: No Typology Code available for payment source

## 2021-05-31 ENCOUNTER — Encounter (HOSPITAL_COMMUNITY): Payer: Self-pay | Admitting: Internal Medicine

## 2021-05-31 ENCOUNTER — Inpatient Hospital Stay (HOSPITAL_COMMUNITY): Payer: No Typology Code available for payment source

## 2021-05-31 DIAGNOSIS — Z91048 Other nonmedicinal substance allergy status: Secondary | ICD-10-CM | POA: Diagnosis not present

## 2021-05-31 DIAGNOSIS — Z7984 Long term (current) use of oral hypoglycemic drugs: Secondary | ICD-10-CM | POA: Diagnosis not present

## 2021-05-31 DIAGNOSIS — A419 Sepsis, unspecified organism: Secondary | ICD-10-CM | POA: Diagnosis present

## 2021-05-31 DIAGNOSIS — Z955 Presence of coronary angioplasty implant and graft: Secondary | ICD-10-CM | POA: Diagnosis not present

## 2021-05-31 DIAGNOSIS — E876 Hypokalemia: Secondary | ICD-10-CM | POA: Diagnosis present

## 2021-05-31 DIAGNOSIS — K8020 Calculus of gallbladder without cholecystitis without obstruction: Secondary | ICD-10-CM | POA: Diagnosis not present

## 2021-05-31 DIAGNOSIS — I2510 Atherosclerotic heart disease of native coronary artery without angina pectoris: Secondary | ICD-10-CM | POA: Diagnosis present

## 2021-05-31 DIAGNOSIS — E119 Type 2 diabetes mellitus without complications: Secondary | ICD-10-CM | POA: Diagnosis not present

## 2021-05-31 DIAGNOSIS — I5042 Chronic combined systolic (congestive) and diastolic (congestive) heart failure: Secondary | ICD-10-CM | POA: Diagnosis present

## 2021-05-31 DIAGNOSIS — I110 Hypertensive heart disease with heart failure: Secondary | ICD-10-CM | POA: Diagnosis present

## 2021-05-31 DIAGNOSIS — E0810 Diabetes mellitus due to underlying condition with ketoacidosis without coma: Secondary | ICD-10-CM | POA: Diagnosis not present

## 2021-05-31 DIAGNOSIS — E782 Mixed hyperlipidemia: Secondary | ICD-10-CM | POA: Diagnosis present

## 2021-05-31 DIAGNOSIS — J181 Lobar pneumonia, unspecified organism: Secondary | ICD-10-CM

## 2021-05-31 DIAGNOSIS — Z794 Long term (current) use of insulin: Secondary | ICD-10-CM | POA: Diagnosis not present

## 2021-05-31 DIAGNOSIS — E1110 Type 2 diabetes mellitus with ketoacidosis without coma: Secondary | ICD-10-CM | POA: Diagnosis present

## 2021-05-31 DIAGNOSIS — R627 Adult failure to thrive: Secondary | ICD-10-CM | POA: Diagnosis present

## 2021-05-31 DIAGNOSIS — R079 Chest pain, unspecified: Secondary | ICD-10-CM | POA: Diagnosis not present

## 2021-05-31 DIAGNOSIS — Z79899 Other long term (current) drug therapy: Secondary | ICD-10-CM | POA: Diagnosis not present

## 2021-05-31 DIAGNOSIS — E1142 Type 2 diabetes mellitus with diabetic polyneuropathy: Secondary | ICD-10-CM | POA: Diagnosis present

## 2021-05-31 DIAGNOSIS — Z6841 Body Mass Index (BMI) 40.0 and over, adult: Secondary | ICD-10-CM | POA: Diagnosis not present

## 2021-05-31 DIAGNOSIS — E1169 Type 2 diabetes mellitus with other specified complication: Secondary | ICD-10-CM | POA: Diagnosis present

## 2021-05-31 DIAGNOSIS — Z7982 Long term (current) use of aspirin: Secondary | ICD-10-CM | POA: Diagnosis not present

## 2021-05-31 DIAGNOSIS — Z89511 Acquired absence of right leg below knee: Secondary | ICD-10-CM | POA: Diagnosis not present

## 2021-05-31 DIAGNOSIS — Z20822 Contact with and (suspected) exposure to covid-19: Secondary | ICD-10-CM | POA: Diagnosis present

## 2021-05-31 DIAGNOSIS — N179 Acute kidney failure, unspecified: Secondary | ICD-10-CM | POA: Diagnosis present

## 2021-05-31 DIAGNOSIS — F17210 Nicotine dependence, cigarettes, uncomplicated: Secondary | ICD-10-CM | POA: Diagnosis present

## 2021-05-31 DIAGNOSIS — E1151 Type 2 diabetes mellitus with diabetic peripheral angiopathy without gangrene: Secondary | ICD-10-CM | POA: Diagnosis present

## 2021-05-31 LAB — I-STAT VENOUS BLOOD GAS, ED
Acid-base deficit: 7 mmol/L — ABNORMAL HIGH (ref 0.0–2.0)
Bicarbonate: 14.9 mmol/L — ABNORMAL LOW (ref 20.0–28.0)
Calcium, Ion: 0.92 mmol/L — ABNORMAL LOW (ref 1.15–1.40)
HCT: 52 % (ref 39.0–52.0)
Hemoglobin: 17.7 g/dL — ABNORMAL HIGH (ref 13.0–17.0)
O2 Saturation: 96 %
Potassium: 5.3 mmol/L — ABNORMAL HIGH (ref 3.5–5.1)
Sodium: 127 mmol/L — ABNORMAL LOW (ref 135–145)
TCO2: 16 mmol/L — ABNORMAL LOW (ref 22–32)
pCO2, Ven: 22.9 mmHg — ABNORMAL LOW (ref 44–60)
pH, Ven: 7.421 (ref 7.25–7.43)
pO2, Ven: 77 mmHg — ABNORMAL HIGH (ref 32–45)

## 2021-05-31 LAB — RESPIRATORY PANEL BY PCR

## 2021-05-31 LAB — BASIC METABOLIC PANEL
Anion gap: 20 — ABNORMAL HIGH (ref 5–15)
Anion gap: 13 (ref 5–15)
Anion gap: 9 (ref 5–15)
Anion gap: 9 (ref 5–15)
Anion gap: 9 (ref 5–15)
BUN: 21 mg/dL (ref 8–23)
BUN: 16 mg/dL (ref 8–23)
BUN: 15 mg/dL (ref 8–23)
BUN: 13 mg/dL (ref 8–23)
BUN: 9 mg/dL (ref 8–23)
CO2: 14 mmol/L — ABNORMAL LOW (ref 22–32)
CO2: 22 mmol/L (ref 22–32)
CO2: 27 mmol/L (ref 22–32)
CO2: 25 mmol/L (ref 22–32)
CO2: 23 mmol/L (ref 22–32)
Calcium: 8.5 mg/dL — ABNORMAL LOW (ref 8.9–10.3)
Calcium: 8.4 mg/dL — ABNORMAL LOW (ref 8.9–10.3)
Calcium: 8.5 mg/dL — ABNORMAL LOW (ref 8.9–10.3)
Calcium: 8.3 mg/dL — ABNORMAL LOW (ref 8.9–10.3)
Calcium: 8.4 mg/dL — ABNORMAL LOW (ref 8.9–10.3)
Chloride: 97 mmol/L — ABNORMAL LOW (ref 98–111)
Chloride: 101 mmol/L (ref 98–111)
Chloride: 101 mmol/L (ref 98–111)
Chloride: 103 mmol/L (ref 98–111)
Chloride: 104 mmol/L (ref 98–111)
Creatinine, Ser: 1.29 mg/dL — ABNORMAL HIGH (ref 0.61–1.24)
Creatinine, Ser: 0.96 mg/dL (ref 0.61–1.24)
Creatinine, Ser: 0.91 mg/dL (ref 0.61–1.24)
Creatinine, Ser: 0.74 mg/dL (ref 0.61–1.24)
Creatinine, Ser: 0.71 mg/dL (ref 0.61–1.24)
GFR, Estimated: >60 mL/min (ref >60)
GFR, Estimated: >60 mL/min (ref >60)
GFR, Estimated: >60 mL/min (ref >60)
GFR, Estimated: >60 mL/min (ref >60)
GFR, Estimated: >60 mL/min (ref >60)
Glucose, Bld: 350 mg/dL — ABNORMAL HIGH (ref 70–99)
Glucose, Bld: 230 mg/dL — ABNORMAL HIGH (ref 70–99)
Glucose, Bld: 106 mg/dL — ABNORMAL HIGH (ref 70–99)
Glucose, Bld: 135 mg/dL — ABNORMAL HIGH (ref 70–99)
Glucose, Bld: 142 mg/dL — ABNORMAL HIGH (ref 70–99)
Potassium: 4.2 mmol/L (ref 3.5–5.1)
Potassium: 4.6 mmol/L (ref 3.5–5.1)
Potassium: 3.8 mmol/L (ref 3.5–5.1)
Potassium: 3.8 mmol/L (ref 3.5–5.1)
Potassium: 3.6 mmol/L (ref 3.5–5.1)
Sodium: 131 mmol/L — ABNORMAL LOW (ref 135–145)
Sodium: 136 mmol/L (ref 135–145)
Sodium: 137 mmol/L (ref 135–145)
Sodium: 137 mmol/L (ref 135–145)
Sodium: 136 mmol/L (ref 135–145)

## 2021-05-31 LAB — ECHOCARDIOGRAM COMPLETE
AR max vel: 2.47 cm2
AV Area VTI: 2.28 cm2
AV Area mean vel: 2.33 cm2
AV Mean grad: 3 mmHg
AV Peak grad: 6.2 mmHg
Ao pk vel: 1.25 m/s
Area-P 1/2: 4.31 cm2
Height: 68 in
S' Lateral: 4.1 cm
Weight: 4240 oz

## 2021-05-31 LAB — CBG MONITORING, ED
Glucose-Capillary: 131 mg/dL — ABNORMAL HIGH (ref 70–99)
Glucose-Capillary: 141 mg/dL — ABNORMAL HIGH (ref 70–99)
Glucose-Capillary: 151 mg/dL — ABNORMAL HIGH (ref 70–99)
Glucose-Capillary: 157 mg/dL — ABNORMAL HIGH (ref 70–99)
Glucose-Capillary: 159 mg/dL — ABNORMAL HIGH (ref 70–99)
Glucose-Capillary: 160 mg/dL — ABNORMAL HIGH (ref 70–99)
Glucose-Capillary: 164 mg/dL — ABNORMAL HIGH (ref 70–99)
Glucose-Capillary: 167 mg/dL — ABNORMAL HIGH (ref 70–99)
Glucose-Capillary: 174 mg/dL — ABNORMAL HIGH (ref 70–99)
Glucose-Capillary: 177 mg/dL — ABNORMAL HIGH (ref 70–99)
Glucose-Capillary: 183 mg/dL — ABNORMAL HIGH (ref 70–99)
Glucose-Capillary: 205 mg/dL — ABNORMAL HIGH (ref 70–99)
Glucose-Capillary: 219 mg/dL — ABNORMAL HIGH (ref 70–99)
Glucose-Capillary: 317 mg/dL — ABNORMAL HIGH (ref 70–99)
Glucose-Capillary: 338 mg/dL — ABNORMAL HIGH (ref 70–99)
Glucose-Capillary: 431 mg/dL — ABNORMAL HIGH (ref 70–99)

## 2021-05-31 LAB — CBC WITH DIFFERENTIAL/PLATELET
Abs Immature Granulocytes: 0.09 10*3/uL — ABNORMAL HIGH (ref 0.00–0.07)
Basophils Absolute: 0 10*3/uL (ref 0.0–0.1)
Basophils Relative: 0 %
Eosinophils Absolute: 0 10*3/uL (ref 0.0–0.5)
Eosinophils Relative: 0 %
HCT: 46.2 % (ref 39.0–52.0)
Hemoglobin: 15.5 g/dL (ref 13.0–17.0)
Immature Granulocytes: 1 %
Lymphocytes Relative: 4 %
Lymphs Abs: 0.5 10*3/uL — ABNORMAL LOW (ref 0.7–4.0)
MCH: 28 pg (ref 26.0–34.0)
MCHC: 33.5 g/dL (ref 30.0–36.0)
MCV: 83.5 fL (ref 80.0–100.0)
Monocytes Absolute: 0.9 10*3/uL (ref 0.1–1.0)
Monocytes Relative: 8 %
Neutro Abs: 9.6 10*3/uL — ABNORMAL HIGH (ref 1.7–7.7)
Neutrophils Relative %: 87 %
Platelets: 155 10*3/uL (ref 150–400)
RBC: 5.53 MIL/uL (ref 4.22–5.81)
RDW: 17.9 % — ABNORMAL HIGH (ref 11.5–15.5)
WBC: 11.1 10*3/uL — ABNORMAL HIGH (ref 4.0–10.5)
nRBC: 0 % (ref 0.0–0.2)

## 2021-05-31 LAB — BETA-HYDROXYBUTYRIC ACID: Beta-Hydroxybutyric Acid: 5.78 mmol/L — ABNORMAL HIGH (ref 0.05–0.27)

## 2021-05-31 LAB — CBC
HCT: 47.3 % (ref 39.0–52.0)
Hemoglobin: 15.6 g/dL (ref 13.0–17.0)
MCH: 27.6 pg (ref 26.0–34.0)
MCHC: 33 g/dL (ref 30.0–36.0)
MCV: 83.7 fL (ref 80.0–100.0)
Platelets: 171 10*3/uL (ref 150–400)
RBC: 5.65 MIL/uL (ref 4.22–5.81)
RDW: 18.3 % — ABNORMAL HIGH (ref 11.5–15.5)
WBC: 10.9 10*3/uL — ABNORMAL HIGH (ref 4.0–10.5)
nRBC: 0 % (ref 0.0–0.2)

## 2021-05-31 LAB — RAPID URINE DRUG SCREEN, HOSP PERFORMED
Amphetamines: NOT DETECTED
Barbiturates: NOT DETECTED
Benzodiazepines: NOT DETECTED
Cocaine: NOT DETECTED
Opiates: NOT DETECTED
Tetrahydrocannabinol: NOT DETECTED

## 2021-05-31 LAB — URINALYSIS, ROUTINE W REFLEX MICROSCOPIC
Bacteria, UA: NONE SEEN
Bilirubin Urine: NEGATIVE
Glucose, UA: >=500 mg/dL — AB
Ketones, ur: 80 mg/dL — AB
Leukocytes,Ua: NEGATIVE
Nitrite: NEGATIVE
Protein, ur: 100 mg/dL — AB
Specific Gravity, Urine: 1.029 (ref 1.005–1.030)
pH: 5 (ref 5.0–8.0)

## 2021-05-31 LAB — LIPASE, BLOOD
Lipase: 21 U/L (ref 11–51)
Lipase: 23 U/L (ref 11–51)

## 2021-05-31 LAB — LACTIC ACID, PLASMA
Lactic Acid, Venous: 1.6 mmol/L (ref 0.5–1.9)
Lactic Acid, Venous: 2.6 mmol/L (ref 0.5–1.9)

## 2021-05-31 LAB — MRSA NEXT GEN BY PCR, NASAL: MRSA by PCR Next Gen: NOT DETECTED

## 2021-05-31 LAB — STREP PNEUMONIAE URINARY ANTIGEN: Strep Pneumo Urinary Antigen: NEGATIVE

## 2021-05-31 LAB — GLUCOSE, CAPILLARY: Glucose-Capillary: 171 mg/dL — ABNORMAL HIGH (ref 70–99)

## 2021-05-31 LAB — CREATININE, SERUM
Creatinine, Ser: 0.91 mg/dL (ref 0.61–1.24)
GFR, Estimated: >60 mL/min (ref >60)

## 2021-05-31 LAB — HIV ANTIBODY (ROUTINE TESTING W REFLEX): HIV Screen 4th Generation wRfx: NONREACTIVE

## 2021-05-31 LAB — TROPONIN I (HIGH SENSITIVITY): Troponin I (High Sensitivity): 26 ng/L — ABNORMAL HIGH (ref <18)

## 2021-05-31 MED ORDER — ASPIRIN 81 MG PO CHEW
81.0000 mg | CHEWABLE_TABLET | Freq: Every day | ORAL | Status: DC
  Administered 2021-05-31 – 2021-06-03 (×4): 81 mg via ORAL
  Filled 2021-05-31 (×4): qty 1

## 2021-05-31 MED ORDER — KETOROLAC TROMETHAMINE 15 MG/ML IJ SOLN
7.5000 mg | Freq: Three times a day (TID) | INTRAMUSCULAR | Status: AC | PRN
  Administered 2021-05-31 – 2021-06-01 (×3): 7.5 mg via INTRAVENOUS
  Filled 2021-05-31 (×3): qty 1

## 2021-05-31 MED ORDER — SODIUM CHLORIDE 0.9 % IV SOLN
2.0000 g | INTRAVENOUS | Status: DC
  Administered 2021-05-31 – 2021-06-02 (×3): 2 g via INTRAVENOUS
  Filled 2021-05-31 (×3): qty 20

## 2021-05-31 MED ORDER — CARVEDILOL 12.5 MG PO TABS
12.5000 mg | ORAL_TABLET | Freq: Two times a day (BID) | ORAL | Status: DC
  Administered 2021-05-31 – 2021-06-01 (×3): 12.5 mg via ORAL
  Filled 2021-05-31 (×3): qty 1

## 2021-05-31 MED ORDER — PANCRELIPASE (LIP-PROT-AMYL) 36000-114000 UNITS PO CPEP
36000.0000 [IU] | ORAL_CAPSULE | Freq: Three times a day (TID) | ORAL | Status: DC
  Administered 2021-06-01 – 2021-06-03 (×8): 36000 [IU] via ORAL
  Filled 2021-05-31 (×10): qty 1

## 2021-05-31 MED ORDER — INSULIN REGULAR(HUMAN) IN NACL 100-0.9 UT/100ML-% IV SOLN
INTRAVENOUS | Status: DC
  Administered 2021-05-31: 15 [IU]/h via INTRAVENOUS
  Filled 2021-05-31: qty 100

## 2021-05-31 MED ORDER — ACETAMINOPHEN 325 MG PO TABS
650.0000 mg | ORAL_TABLET | Freq: Once | ORAL | Status: AC
Start: 2021-05-31 — End: 2021-05-31
  Administered 2021-05-31: 650 mg via ORAL
  Filled 2021-05-31: qty 2

## 2021-05-31 MED ORDER — SODIUM CHLORIDE 0.9 % IV SOLN: INTRAVENOUS | Status: DC | PRN

## 2021-05-31 MED ORDER — INSULIN REGULAR(HUMAN) IN NACL 100-0.9 UT/100ML-% IV SOLN
INTRAVENOUS | Status: DC
  Administered 2021-05-31: 3.4 [IU]/h via INTRAVENOUS
  Filled 2021-05-31: qty 100

## 2021-05-31 MED ORDER — CLOPIDOGREL BISULFATE 75 MG PO TABS
75.0000 mg | ORAL_TABLET | Freq: Every day | ORAL | Status: DC
  Administered 2021-05-31 – 2021-06-03 (×4): 75 mg via ORAL
  Filled 2021-05-31 (×4): qty 1

## 2021-05-31 MED ORDER — DEXTROSE 50 % IV SOLN: 0.0000 mL | INTRAVENOUS | Status: DC | PRN

## 2021-05-31 MED ORDER — SODIUM CHLORIDE 0.9 % IV SOLN
500.0000 mg | INTRAVENOUS | Status: DC
  Administered 2021-05-31 – 2021-06-03 (×4): 500 mg via INTRAVENOUS
  Filled 2021-05-31 (×4): qty 5

## 2021-05-31 MED ORDER — LABETALOL HCL 5 MG/ML IV SOLN
5.0000 mg | Freq: Once | INTRAVENOUS | Status: AC
  Administered 2021-05-31: 5 mg via INTRAVENOUS
  Filled 2021-05-31: qty 4

## 2021-05-31 MED ORDER — METOPROLOL TARTRATE 25 MG PO TABS: 12.5000 mg | ORAL_TABLET | Freq: Two times a day (BID) | ORAL | Status: DC

## 2021-05-31 MED ORDER — DEXTROSE IN LACTATED RINGERS 5 % IV SOLN: INTRAVENOUS | Status: DC

## 2021-05-31 MED ORDER — HEPARIN SODIUM (PORCINE) 5000 UNIT/ML IJ SOLN
5000.0000 [IU] | Freq: Three times a day (TID) | INTRAMUSCULAR | Status: DC
  Administered 2021-05-31 – 2021-06-03 (×10): 5000 [IU] via SUBCUTANEOUS
  Filled 2021-05-31 (×10): qty 1

## 2021-05-31 MED ORDER — TAMSULOSIN HCL 0.4 MG PO CAPS
0.4000 mg | ORAL_CAPSULE | Freq: Every day | ORAL | Status: DC
  Administered 2021-05-31 – 2021-06-03 (×4): 0.4 mg via ORAL
  Filled 2021-05-31 (×4): qty 1

## 2021-05-31 MED ORDER — LACTATED RINGERS IV SOLN: INTRAVENOUS | Status: DC

## 2021-05-31 MED ORDER — INSULIN GLARGINE-YFGN 100 UNIT/ML ~~LOC~~ SOLN
40.0000 [IU] | Freq: Every day | SUBCUTANEOUS | Status: DC
  Administered 2021-06-01 – 2021-06-03 (×3): 40 [IU] via SUBCUTANEOUS
  Filled 2021-05-31 (×3): qty 0.4

## 2021-05-31 MED ORDER — INSULIN GLARGINE-YFGN 100 UNIT/ML ~~LOC~~ SOLN
40.0000 [IU] | Freq: Once | SUBCUTANEOUS | Status: AC
  Administered 2021-05-31: 40 [IU] via SUBCUTANEOUS
  Filled 2021-05-31: qty 0.4

## 2021-05-31 MED ORDER — ACETAMINOPHEN 325 MG PO TABS
650.0000 mg | ORAL_TABLET | Freq: Four times a day (QID) | ORAL | Status: DC | PRN
Start: 2021-05-31 — End: 2021-06-03
  Administered 2021-05-31 – 2021-06-03 (×3): 650 mg via ORAL
  Filled 2021-05-31 (×3): qty 2

## 2021-05-31 MED ORDER — ONDANSETRON HCL 4 MG/2ML IJ SOLN: 4.0000 mg | Freq: Four times a day (QID) | INTRAMUSCULAR | Status: DC | PRN

## 2021-05-31 MED ORDER — INSULIN ASPART 100 UNIT/ML IJ SOLN
0.0000 [IU] | Freq: Three times a day (TID) | INTRAMUSCULAR | Status: DC
  Administered 2021-06-01: 2 [IU] via SUBCUTANEOUS
  Administered 2021-06-01: 8 [IU] via SUBCUTANEOUS
  Administered 2021-06-02: 5 [IU] via SUBCUTANEOUS
  Administered 2021-06-02 (×2): 3 [IU] via SUBCUTANEOUS
  Administered 2021-06-03: 5 [IU] via SUBCUTANEOUS

## 2021-05-31 MED ORDER — FOLIC ACID 1 MG PO TABS
1.0000 mg | ORAL_TABLET | Freq: Every day | ORAL | Status: DC
  Administered 2021-05-31 – 2021-06-03 (×4): 1 mg via ORAL
  Filled 2021-05-31 (×4): qty 1

## 2021-05-31 MED ORDER — PANTOPRAZOLE SODIUM 40 MG IV SOLR
40.0000 mg | Freq: Every day | INTRAVENOUS | Status: AC
  Administered 2021-05-31 – 2021-06-01 (×2): 40 mg via INTRAVENOUS
  Filled 2021-05-31 (×2): qty 10

## 2021-05-31 MED ORDER — THIAMINE HCL 100 MG PO TABS
100.0000 mg | ORAL_TABLET | Freq: Every day | ORAL | Status: DC
  Administered 2021-05-31 – 2021-06-03 (×4): 100 mg via ORAL
  Filled 2021-05-31 (×4): qty 1

## 2021-05-31 MED ORDER — PANTOPRAZOLE SODIUM 40 MG PO TBEC
40.0000 mg | DELAYED_RELEASE_TABLET | Freq: Every day | ORAL | Status: DC
Start: 2021-06-02 — End: 2021-05-31

## 2021-05-31 NOTE — ED Notes (Signed)
Reached out to Admitted MD, Florencia Reasons, regarding pts complaints of new chest pain. ED MD evaluated in the meantime ?

## 2021-05-31 NOTE — Progress Notes (Addendum)
Inpatient Diabetes Program Recommendations ? ?AACE/ADA: New Consensus Statement on Inpatient Glycemic Control (2015) ? ?Target Ranges:  Prepandial:   less than 140 mg/dL ?     Peak postprandial:   less than 180 mg/dL (1-2 hours) ?     Critically ill patients:  140 - 180 mg/dL  ? ?Lab Results  ?Component Value Date  ? GLUCAP 151 (H) 05/31/2021  ? HGBA1C 10.4 (H) 07/21/2020  ? ? ?Review of Glycemic Control ? ?Diabetes history: DM 2 ?Outpatient Diabetes medications: Jardiance 12.5 mg Daily, Novolog 0-35 units tid, Lantus 16 units Daily, Metformin 500 mg bid ?Current orders for Inpatient glycemic control:  ?IV insulin/Endotool ? ?MD Note: DKA may also be precipitated by pt being on SGLT2 inhibitor Jardiance at home. Would not restart at d/c and have pt follow up with provider. ? ?IV insulin gtt rate between  ?2.6-4.6 units/hour ? ?Inpatient Diabetes Program Recommendations:   ? ?- Consider obtaining an A1c to determine glucose control over the past 2-3 months. None pending currently. ? ?At time of transition consider: ?-  Levemir 10 units bid ?-  Novolog 0-15 units tid + hs ? ? ?Addendum 1:09 pm: ?Spoke with pt at bedside regarding glucose control at home. Pt reports seeing his doctor twice a month. Pt uses a Freestyle Libre for glucose checks at home (currently on the back of his right arm) . Pt says he monitors at least 2-3 times a day. Pt states his normal glucose is around 170's. Pt reports he takes his Lantus, Jardiance, and metformin consistently at home. Pt reports having difficulty remembering to take his Novolog tid at home. Encouraged follow up and reviewed glucose goals at home.  ? ?Thanks, ? ?Christena Deem RN, MSN, BC-ADM ?Inpatient Diabetes Coordinator ?Team Pager [phone] (8a-5p) ? ?

## 2021-05-31 NOTE — Progress Notes (Signed)
?  Echocardiogram ?2D Echocardiogram has been performed. ? ?Arlyss Gandy ?05/31/2021, 12:19 PM ?

## 2021-05-31 NOTE — H&P (Signed)
?History and Physical  ? ? ?Babe Clenney OTL:[id number] DOB: Mar 30, 1960 DOA: 05/30/2021 ? ?PCP: [facility]  ?Patient coming from: home  ?I have personally briefly reviewed patient's old medical records in [facility] ? ?Chief Complaint: chest pain/ dizziness ? ?HPI: Allah Reason is a 61 y.o. male with medical history significant of  HTN, DMII, HLD, chronic pancreatitis, who presents with 3 day history of fever, HA , epigastric pain that radiate to lower chest , near syncope, fatigue/generalized malaise.  On evaluation in ED patient was found to have RLL PNA, AKI as well as uncontrolled DMII with elevated sugars and DKA.  Patient notes he has not been  fully complaint with his diabetic regimen.  ON further ros he denies any symptoms suggestive of cardiac chest pain.  ? ? ?ED Course: ?Tmx100.2, Bp 152/100, hr 133, rr 24, sat 94%  ? Patient started on antibiotics and endo stool .  In reference to chest pain , patient CE were flat  ?With ekg notable for sinus tachycardia to 140's with st rate related changes.  ? ?Labs notable for  ?Wbc 11.5,  hgb 16.9,  pmn 10 ?Lactic 2,2.6 , beta-hydroxybutyric acid:5.78 ?NA 132( base 141) cr 1.22  (0.9) aG 20 ?Ce 27,26 ?Bnp 67 ?UA:+ gluc,+ketones ?Ph:7.4/22.9 ?1. Moderate to marked severity right middle lobe infiltrate. ?2. Hepatic steatosis. ?3. Cholelithiasis. ?4. Findings within the region of the pancreatic head which may ?represent sequelae associated with chronic pancreatitis. ?5. Bilateral, diffusely enlarged, mildly nodular appearing adrenal ?glands which may represent small adrenal adenomas versus adrenal ?hyperplasia. ?6. Aortic atherosclerosis. ? ?Review of Systems: As per HPI otherwise 10 point review of systems negative.  ? ?Past Medical History:  ?Diagnosis Date  ? Alcohol abuse 06/14/2019  ? Diabetes mellitus without complication (HCC)   ? Essential hypertension 06/14/2019  ? Mixed hyperlipidemia due to type 2 diabetes mellitus (HCC) 07/31/2019  ?  Nicotine dependence, cigarettes, uncomplicated 07/31/2019  ? Pancreatitis   ? Uncontrolled type 2 diabetes mellitus with diabetic polyneuropathy, with long-term current use of insulin 07/31/2019  ? ? ?Past Surgical History:  ?Procedure Laterality Date  ? ABDOMINAL AORTOGRAM W/LOWER EXTREMITY N/A 12/02/2020  ? Procedure: ABDOMINAL AORTOGRAM W/LOWER EXTREMITY;  Surgeon: Leonie Douglas, MD;  Location: [facility] INVASIVE [facility] LAB;  Service: Cardiovascular;  Laterality: N/A;  ? AMPUTATION Right 03/13/2020  ? Procedure: TRANSMETATARSAL AMPUTATION;  Surgeon: Chuck Hint, MD;  Location: [facility];  Service: Vascular;  Laterality: Right;  ? AMPUTATION Right 03/15/2020  ? Procedure: RIGHT BELOW KNEE AMPUTATION;  Surgeon: Chuck Hint, MD;  Location: [facility] OR;  Service: Vascular;  Laterality: Right;  ? CORONARY ANGIOPLASTY WITH STENT PLACEMENT    ? PERIPHERAL VASCULAR INTERVENTION  12/02/2020  ? Procedure: PERIPHERAL VASCULAR INTERVENTION;  Surgeon: Leonie Douglas, MD;  Location: [facility] INVASIVE CV LAB;  Service: Cardiovascular;;  Lt SFA, LEIA  ? ? ? reports that he has been smoking cigarettes. He has been smoking an average of .25 packs per day. He has never used smokeless tobacco. He reports current alcohol use. He reports that he does not currently use drugs. ? ?Allergies  ?Allergen Reactions  ? Iohexol Hives  ?  Patient broke out in hives after injection of Omni 300, will need 13 hour pre-med in future  ? Semaglutide Other (See Comments)  ?  Other reaction(s): Other ?Pancreatitis ?Ozempic  ? Lac Bovis Diarrhea  ?  Other reaction(s): Finding of gastrointestinal tract gas, Diarrhea  ? Lisinopril Cough  ?  Simvastatin Itching and Nausea Only  ? ? ?Family History  ?Problem Relation Age of Onset  ? Other Neg Hx   ?DMII ?HTN ?Prior to Admission medications   ?Medication Sig Start Date End Date Taking? Authorizing Provider  ?acetaminophen (TYLENOL) 325 MG tablet Take 2 tablets (650 mg total) by mouth every 6 (six) hours as  needed. ?Patient taking differently: Take 650 mg by mouth every 6 (six) hours as needed for mild pain or moderate pain. 10/26/20   Sloan LeiterGray, Samuel A, DO  ?amLODipine (NORVASC) 5 MG tablet Take 5 mg by mouth daily. 04/25/20   [provider]  ?aspirin 81 MG tablet Take 81 mg by mouth daily.    [provider]  ?atorvastatin (LIPITOR) 80 MG tablet Take 80 mg by mouth daily.    [provider]  ?buPROPion (WELLBUTRIN SR) 150 MG 12 hr tablet Take 150 mg by mouth in the morning and at bedtime.    [provider]  ?carvedilol (COREG) 25 MG tablet Take 25 mg by mouth 2 (two) times daily with a meal. 01/20/20   [provider]  ?clopidogrel (PLAVIX) 75 MG tablet Take 1 tablet (75 mg total) by mouth daily. 12/02/20 12/02/21  Leonie DouglasHawken, Cherylin Waguespack N, MD  ?folic acid (FOLVITE) 1 MG tablet Take 1 tablet (1 mg total) by mouth daily. 10/11/19   Rodolph Bonghompson, Daniel V, MD  ?furosemide (LASIX) 20 MG tablet Take 0.5 tablets by mouth daily. 10/10/20   [provider]  ?gabapentin (NEURONTIN) 300 MG capsule Take 300-600 mg by mouth See admin instructions. Take 300 mg morning, noon at 600 mg at bedtime    [provider]  ?hydrOXYzine (VISTARIL) 25 MG capsule Take 25 mg by mouth at bedtime as needed (Sleep). 01/07/20   [provider]  ?insulin aspart (NOVOLOG) 100 UNIT/ML FlexPen Inject 4 Units into the skin 3 (three) times daily with meals. ?Patient taking differently: Inject 0-35 Units into the skin See admin instructions. Per sliding scale with meals 10/10/19   Rodolph Bonghompson, Daniel V, MD  ?insulin glargine (LANTUS) 100 UNIT/ML Solostar Pen Inject 16 Units into the skin daily. 10/10/19   Rodolph Bonghompson, Daniel V, MD  ?insulin glargine-yfgn (SEMGLEE) 100 UNIT/ML Pen Inject 65 Units into the skin daily. 10/10/20   [provider]  ?Insulin Pen Needle (NOVOFINE) 30G X 8 MM MISC Inject 10 each into the skin as needed. 10/10/19   Rodolph Bonghompson, Daniel V, MD  ?isosorbide mononitrate (IMDUR) 30 MG  24 hr tablet Take 30 mg by mouth daily. 01/20/20   [provider]  ?lipase/protease/amylase (CREON) 36000 UNITS CPEP capsule Take 1 capsule (36,000 Units total) by mouth 3 (three) times daily before meals. 06/17/19   Laverna PeaceNettey, Shayla D, MD  ?losartan (COZAAR) 100 MG tablet Take 50 mg by mouth daily. 04/25/20   [provider]  ?lurasidone (LATUDA) 20 MG TABS tablet Take 40 mg by mouth daily.    [provider]  ?magnesium oxide (MAG-OX) 400 (241.3 Mg) MG tablet Take 1 tablet (400 mg total) by mouth 2 (two) times daily. 03/18/20   Burnadette PopAdhikari, Amrit, MD  ?mesalamine (LIALDA) 1.2 g EC tablet Take 1,200 mg by mouth daily. 11/10/20   [provider]  ?metFORMIN (GLUCOPHAGE) 500 MG tablet Take 500 mg by mouth daily with breakfast.    [provider]  ?naproxen sodium (ALEVE) 220 MG tablet Take 440 mg by mouth 2 (two) times daily as needed (headache).    [provider]  ?nicotine (NICODERM CQ - DOSED  IN MG/24 HOURS) 14 mg/24hr patch Place 14 mg onto the skin daily. 04/07/20   [provider]  ?pantoprazole (PROTONIX) 40 MG tablet Take 40 mg by mouth daily. 10/10/20   [provider]  ?polyethylene glycol (MIRALAX / GLYCOLAX) 17 g packet Take 17 g by mouth daily as needed for mild constipation. 03/18/20   Burnadette Pop, MD  ?sertraline (ZOLOFT) 50 MG tablet Take 100 mg by mouth daily. 03/31/20   [provider]  ?tamsulosin (FLOMAX) 0.4 MG CAPS capsule Take 0.4 mg by mouth daily. 01/20/20   [provider]  ?thiamine 100 MG tablet Take 1 tablet (100 mg total) by mouth daily. 10/11/19   Rodolph Bong, MD  ?vitamin B-12 (CYANOCOBALAMIN) 500 MCG tablet Take 500 mcg by mouth daily. 01/20/20   [provider]  ? ? ?Physical Exam: ?Vitals:  ? 05/31/21 0330 05/31/21 0345 05/31/21 0400 05/31/21 0415  ?BP: (!) 143/82 (!) 143/85 131/80 127/79  ?Pulse: (!) 110 (!) 109 (!) 109 (!) 107  ?Resp: 20 19 19 18   ?Temp:      ?TempSrc:      ?SpO2: 97% 97%  96% 96%  ?Weight:      ?Height:      ? ? ? ?Vitals:  ? 05/31/21 0330 05/31/21 0345 05/31/21 0400 05/31/21 0415  ?BP: (!) 143/82 (!) 143/85 131/80 127/79  ?Pulse: (!) 110 (!) 109 (!) 109 (!) 107  ?Resp: 20 19

## 2021-05-31 NOTE — ED Notes (Signed)
The admitting doctor would like the pt to eat something and check a temp on him  will do ?

## 2021-05-31 NOTE — ED Notes (Signed)
The pt only wants water no food  he reports that he has had some chest pain since he arrived here  no  worse 6/10 ?

## 2021-05-31 NOTE — ED Notes (Signed)
Reached out to Admitted MD, Albertine Grates, regarding Endotools recommendation to transition insulin.  ?

## 2021-05-31 NOTE — ED Notes (Signed)
RN aware of pt o2 levels  

## 2021-05-31 NOTE — ED Notes (Signed)
The pt had his 02 under his chin his sats were  88  nasal can placed into his nostrils  sats 92 on 2 liters nasal ?

## 2021-05-31 NOTE — ED Notes (Signed)
Orders to d/c insulin drip one hour after this sq insulin is given ?

## 2021-05-31 NOTE — Progress Notes (Signed)
PNA/sepsis: spike fever, abx,   Lactic acid normalized, WBC improving, urine strep pneumo antigen negative, urine Legionella antigen in process, right side Chest pain, pleuritic, high sensitive troponin in the 20s, has history of cocaine use and alcohol use, will give Protonix , as needed Toradol, follow-up on echocardiogram, get CT chest, not able to use contrast due to dye allergy ?DKA: Currently on insulin drip and p.o. , last BMP was 2 AM ,get stat BMP, transition to subQ whengap closes ?Hyponatremia: Likely pseudohyponatremia in the setting of hyperglycemia ?AKI: Creatinine improving ?History of alcohol pancreatitis; lipase unremarkable ?Hypertension: Avoid beta-blocker due to history of cocaine use ,awaiting for urine drug screen ?Right BKA wikth prosthesis ?

## 2021-06-01 DIAGNOSIS — N179 Acute kidney failure, unspecified: Secondary | ICD-10-CM | POA: Diagnosis not present

## 2021-06-01 DIAGNOSIS — J181 Lobar pneumonia, unspecified organism: Secondary | ICD-10-CM | POA: Diagnosis not present

## 2021-06-01 DIAGNOSIS — E0810 Diabetes mellitus due to underlying condition with ketoacidosis without coma: Secondary | ICD-10-CM | POA: Diagnosis not present

## 2021-06-01 LAB — CBC WITH DIFFERENTIAL/PLATELET
Abs Immature Granulocytes: 0.04 10*3/uL (ref 0.00–0.07)
Basophils Absolute: 0 10*3/uL (ref 0.0–0.1)
Basophils Relative: 0 %
Eosinophils Absolute: 0 10*3/uL (ref 0.0–0.5)
Eosinophils Relative: 0 %
HCT: 45.2 % (ref 39.0–52.0)
Hemoglobin: 15 g/dL (ref 13.0–17.0)
Immature Granulocytes: 1 %
Lymphocytes Relative: 11 %
Lymphs Abs: 0.9 10*3/uL (ref 0.7–4.0)
MCH: 27.4 pg (ref 26.0–34.0)
MCHC: 33.2 g/dL (ref 30.0–36.0)
MCV: 82.6 fL (ref 80.0–100.0)
Monocytes Absolute: 1.3 10*3/uL — ABNORMAL HIGH (ref 0.1–1.0)
Monocytes Relative: 16 %
Neutro Abs: 5.8 10*3/uL (ref 1.7–7.7)
Neutrophils Relative %: 72 %
Platelets: 120 10*3/uL — ABNORMAL LOW (ref 150–400)
RBC: 5.47 MIL/uL (ref 4.22–5.81)
RDW: 18.5 % — ABNORMAL HIGH (ref 11.5–15.5)
WBC: 8 10*3/uL (ref 4.0–10.5)
nRBC: 0 % (ref 0.0–0.2)

## 2021-06-01 LAB — LEGIONELLA PNEUMOPHILA SEROGP 1 UR AG: L. pneumophila Serogp 1 Ur Ag: NEGATIVE

## 2021-06-01 LAB — COMPREHENSIVE METABOLIC PANEL
ALT: 20 U/L (ref 0–44)
AST: 36 U/L (ref 15–41)
Albumin: 2.4 g/dL — ABNORMAL LOW (ref 3.5–5.0)
Alkaline Phosphatase: 72 U/L (ref 38–126)
Anion gap: 11 (ref 5–15)
BUN: 12 mg/dL (ref 8–23)
CO2: 20 mmol/L — ABNORMAL LOW (ref 22–32)
Calcium: 8.1 mg/dL — ABNORMAL LOW (ref 8.9–10.3)
Chloride: 105 mmol/L (ref 98–111)
Creatinine, Ser: 0.76 mg/dL (ref 0.61–1.24)
GFR, Estimated: >60 mL/min (ref >60)
Glucose, Bld: 129 mg/dL — ABNORMAL HIGH (ref 70–99)
Potassium: 4 mmol/L (ref 3.5–5.1)
Sodium: 136 mmol/L (ref 135–145)
Total Bilirubin: 0.5 mg/dL (ref 0.3–1.2)
Total Protein: 6.6 g/dL (ref 6.5–8.1)

## 2021-06-01 LAB — GLUCOSE, CAPILLARY
Glucose-Capillary: 131 mg/dL — ABNORMAL HIGH (ref 70–99)
Glucose-Capillary: 155 mg/dL — ABNORMAL HIGH (ref 70–99)
Glucose-Capillary: 293 mg/dL — ABNORMAL HIGH (ref 70–99)
Glucose-Capillary: 224 mg/dL — ABNORMAL HIGH (ref 70–99)

## 2021-06-01 MED ORDER — CARVEDILOL 6.25 MG PO TABS
6.2500 mg | ORAL_TABLET | Freq: Two times a day (BID) | ORAL | Status: DC
  Administered 2021-06-02 – 2021-06-03 (×3): 6.25 mg via ORAL
  Filled 2021-06-01 (×3): qty 1

## 2021-06-01 MED ORDER — POLYVINYL ALCOHOL 1.4 % OP SOLN
1.0000 [drp] | OPHTHALMIC | Status: DC | PRN
  Administered 2021-06-01: 1 [drp] via OPHTHALMIC
  Filled 2021-06-01 (×2): qty 15

## 2021-06-01 MED ORDER — WITCH HAZEL-GLYCERIN EX PADS
MEDICATED_PAD | CUTANEOUS | Status: DC | PRN
  Filled 2021-06-01: qty 100

## 2021-06-01 NOTE — Progress Notes (Addendum)
?PROGRESS NOTE ? ? ? ?Ricardo Howard  HER:[id number] DOB: Nov 24, 1960 DOA: 05/30/2021 ?PCP: [facility], [facility]  ? ? ? ?Brief Narrative:  ? ?Ricardo Howard is a 61 y.o. male with medical history significant of  HTN, DMII, HLD, chronic pancreatitis, who presents with 3 day history of fever, HA , epigastric pain that radiate to lower chest , near syncope, fatigue/generalized malaise.  On evaluation in ED patient was found to have RLL PNA, AKI as well as uncontrolled DMII with elevated sugars and DKA.  Patient notes he has not been  fully complaint with his diabetic regimen ? ?Subjective: ? ?Right side chest pain and ab pain is improving, less cough ?Reports having loose stools x5 now,  ?Fever subsided  ? ?Assessment & Plan: ? Principal Problem: ?  DKA (diabetic ketoacidosis) (HCC) ? ? ?Lobar pneumonia with sepsis present on admission ?-Improving on current antibiotic regimen ? ?Atypical chest pain , likely due to pneumonia ?-ce flat. ekg no hyperacute findings  ?- echo with poor cardiac window, not able to assess wall motion abnormalities, LVEF 40 to 45%, grade 1 diastolic dysfunction ?-continue to monitor on tele  ? ?Uncontrolled DMII with DKA, present on admission ?-insetting of CAP and partial compliance with medication  ?-Transition off insulin drip , now on subcu insulin , continue to adjust insulin as needed  ?-Hold metformin, plan to resume at discharge ?  ? AKI ?-hold nephrotoxic medications ?-Resolved ?-Likely able to resume Lasix and Cozaar in the next 1 to 2 days ?  ?Hx of ETOH pancreatitis  ?-etoh in remission  ?- lipase unremarkable ?Continue pancreas enzyme supplement ?  ?  ?  ?HTN  ?- carvedilol , adjust dose as needed ?Hold for amlodipine  ?-hold arb due to AKI ?-Hold Imdur ? ?CAD status post stent ?PVD ?Continue home medication aspirin Plavix Lipitor ?  ? ?Right BKA, having prosthesis ? ?FTT/weakness, will get PT to see ? ?Class III obesity: Body mass index is 40.29 kg/m?Marland Kitchen. ? ? ? ? ?I have  Reviewed nursing notes, Vitals, pain scores, I/o's, Lab results and  imaging results since pt's last encounter, details please see discussion above  ?I ordered the following labs:  ?Unresulted Labs (From admission, onward)  ? ?  Start     Ordered  ? 06/02/21 0500  CBC  Tomorrow morning,   R       ? 06/01/21 1938  ? 06/02/21 0500  Basic metabolic panel  Tomorrow morning,   R       ? 06/01/21 1938  ? 06/02/21 0500  Magnesium  Tomorrow morning,   R       ? 06/01/21 1938  ? 06/01/21 1545  C Difficile Quick Screen w PCR reflex  (C Difficile quick screen w PCR reflex panel )  Once, for 24 hours,   TIMED       ?References:    CDiff Information Tool  ? 06/01/21 1545  ? ?  ?  ? ?  ? ? ? ?DVT prophylaxis: heparin injection 5,000 Units Start: 05/31/21 0600 ?SCDs Start: 05/31/21 0459 ? ? ?Code Status:   Code Status: Full Code ? ?Family Communication: Patient ?Disposition:  ? ?Status is: Inpatient ? ?Dispo: The patient is from: Home ?             Anticipated d/c is to: Home ?             Anticipated d/c date is: 24 to 48 hours, may need home PT ? ?  Antimicrobials:   ?Anti-infectives (From admission, onward)  ? ? Start     Dose/Rate Route Frequency Ordered Stop  ? 05/31/21 2200  cefTRIAXone (ROCEPHIN) 2 g in sodium chloride 0.9 % 100 mL IVPB       ? 2 g ?200 mL/hr over 30 Minutes Intravenous Every 24 hours 05/31/21 0501 06/05/21 2159  ? 05/31/21 1000  azithromycin (ZITHROMAX) tablet 500 mg  Status:  Discontinued       ? 500 mg Oral Daily 05/30/21 2355 05/31/21 0504  ? 05/31/21 0515  azithromycin (ZITHROMAX) 500 mg in sodium chloride 0.9 % 250 mL IVPB       ? 500 mg ?250 mL/hr over 60 Minutes Intravenous Every 24 hours 05/31/21 0501 06/05/21 0514  ? 05/31/21 0000  cefTRIAXone (ROCEPHIN) 1 g in sodium chloride 0.9 % 100 mL IVPB       ? 1 g ?200 mL/hr over 30 Minutes Intravenous  Once 05/30/21 2355 05/31/21 0144  ? ?  ? ? ? ? ? ? ?Objective: ?Vitals:  ? 06/01/21 0000 06/01/21 0335 06/01/21 0745 06/01/21 1515  ?BP: 101/80 125/76 (!)  140/94 108/65  ?Pulse: 94 96 97 84  ?Resp: 19 (!) 22 18 18   ?Temp: 98.6 ?F (37 ?C) 99.3 ?F (37.4 ?C) 98.7 ?F (37.1 ?C) 98.2 ?F (36.8 ?C)  ?TempSrc: Oral Oral Oral Oral  ?SpO2: 95% 97% 96% 92%  ?Weight:      ?Height:      ? ? ?Intake/Output Summary (Last 24 hours) at 06/01/2021 1949 ?Last data filed at 06/01/2021 0703 ?Gross per 24 hour  ?Intake --  ?Output 550 ml  ?Net -550 ml  ? ?Filed Weights  ? 05/30/21 2007  ?Weight: 120.2 kg  ? ? ?Examination: ? ?General exam: Appears stronger ?Respiratory system: Clear to auscultation. Respiratory effort normal. ?Cardiovascular system:  RRR.  ?Gastrointestinal system: Abdomen is nondistended, soft and nontender.  Normal bowel sounds heard. ?Central nervous system: Alert and oriented. No focal neurological deficits. ?Extremities: Right BKA ?Skin: No rashes, lesions or ulcers ?Psychiatry: Judgement and insight appear normal. Mood & affect appropriate.  ? ? ? ?Data Reviewed: I have personally reviewed  labs and visualized  imaging studies since the last encounter and formulate the plan  ? ? ? ? ? ? ?Scheduled Meds: ? aspirin  81 mg Oral Daily  ? [START ON 06/02/2021] carvedilol  6.25 mg Oral BID WC  ? clopidogrel  75 mg Oral Daily  ? folic acid  1 mg Oral Daily  ? heparin  5,000 Units Subcutaneous Q8H  ? insulin aspart  0-15 Units Subcutaneous TID WC  ? insulin glargine-yfgn  40 Units Subcutaneous Daily  ? lipase/protease/amylase  36,000 Units Oral TID AC  ? tamsulosin  0.4 mg Oral Daily  ? thiamine  100 mg Oral Daily  ? ?Continuous Infusions: ? sodium chloride 10 mL/hr at 05/31/21 2239  ? azithromycin Stopped (06/01/21 1811)  ? cefTRIAXone (ROCEPHIN)  IV 2 g (05/31/21 2240)  ? ? ? LOS: 1 day  ? ? ? ?07/31/21, MD PhD FACP ?[facility] ? ?Available via Epic secure chat 7am-7pm for nonurgent issues ?Please page for urgent issues ?To page the attending provider between 7A-7P or the covering provider during after hours 7P-7A, please log into the web site [email or web address] and access  using universal [place] password for that web site. If you do not have the password, please call the hospital operator. ? ? ? ?06/01/2021, 7:49 PM  ? ? ?

## 2021-06-01 NOTE — Plan of Care (Signed)
?  Problem: Activity: ?Goal: Ability to tolerate increased activity will improve ?Outcome: Progressing ?  ?Problem: Clinical Measurements: ?Goal: Ability to maintain a body temperature in the normal range will improve ?Outcome: Progressing ?  ?Problem: Respiratory: ?Goal: Ability to maintain adequate ventilation will improve ?Outcome: Progressing ?Goal: Ability to maintain a clear airway will improve ?Outcome: Progressing ?  ?Problem: Education: ?Goal: Ability to describe self-care measures that may prevent or decrease complications (Diabetes Survival Skills Education) will improve ?Outcome: Progressing ?Goal: Individualized Educational Video(s) ?Outcome: Progressing ?  ?Problem: Fluid Volume: ?Goal: Ability to maintain a balanced intake and output will improve ?Outcome: Progressing ?  ?Problem: Tissue Perfusion: ?Goal: Adequacy of tissue perfusion will improve ?Outcome: Progressing ?  ?Problem: Skin Integrity: ?Goal: Risk for impaired skin integrity will decrease ?Outcome: Progressing ?  ?

## 2021-06-01 NOTE — Evaluation (Signed)
Physical Therapy Evaluation ?Patient Details ?Name: Ricardo Howard ?MRN: [id number] ?DOB: 10/06/1960 ?Today's Date: 06/01/2021 ? ?History of Present Illness ? The pt is a 61 yo male presenting 4/11 with c/o dizziness, chest pain, and weakness. Found to have RLL PNA, AKI, and uncontrolled DM II with elevated BG and DKA. PMH includes: DM, EtOH abuse, HTN, R BKA. ?  ?Clinical Impression ? Pt in bed upon arrival of PT, agreeable to evaluation at this time. Prior to admission the pt was independent with use of prosthetic for mobility, states independent with ADLs and IADLs but does rely on wife for some LB dressing. The pt now presents with limitations in functional mobility, strength, power, dynamic stability, and endurance due to above dx, and will continue to benefit from skilled PT to address these deficits. The pt was able to complete bed mobility with increased time, effort, and minA to complete trunk elevation. He also required minA to complete sit-stand transfer without RW, and minG with use of RW. He was able to complete ~75 ft hallway ambulation prior to needing standing rest, but is dependent on BUE support at this time. Will continue to benefit from skilled PT to progress endurance, independence with transfers, and return to independence with mobility without need for RW.  ?   ?   ? ?Recommendations for follow up therapy are one component of a multi-disciplinary discharge planning process, led by the attending physician.  Recommendations may be updated based on patient status, additional functional criteria and insurance authorization. ? ?Follow Up Recommendations Home health PT ? ?  ?Assistance Recommended at Discharge Frequent or constant Supervision/Assistance  ?Patient can return home with the following ? A little help with walking and/or transfers;Help with stairs or ramp for entrance ? ?  ?Equipment Recommendations None recommended by PT  ?Recommendations for Other Services ?    ?  ?Functional Status  Assessment Patient has had a recent decline in their functional status and demonstrates the ability to make significant improvements in function in a reasonable and predictable amount of time.  ? ?  ?Precautions / Restrictions Precautions ?Precautions: Fall ?Precaution Comments: chronic R BKA with prosthetic ?Restrictions ?Weight Bearing Restrictions: No  ? ?  ? ?Mobility ? Bed Mobility ?Overal bed mobility: Needs Assistance ?Bed Mobility: Supine to Sit ?  ?  ?Supine to sit: Min assist ?  ?  ?General bed mobility comments: minA with pt using bed rail to pull to sitting, minA to elevate trunk to sitting EOB ?  ? ?Transfers ?Overall transfer level: Needs assistance ?Equipment used: 1 person hand held assist, Rolling walker (2 wheels) ?Transfers: Sit to/from Stand, Bed to chair/wheelchair/BSC ?Sit to Stand: Min assist, Min guard ?  ?Step pivot transfers: Min assist, Min guard ?  ?  ?  ?General transfer comment: minA to power up and steady, then [facility]. pt reaching for UE support on armrests to guide on initial attempt, minG with use of RW ?  ? ?Ambulation/Gait ?Ambulation/Gait assistance: Min guard ?Gait Distance (Feet): 75 Feet (+ 75 ft) ?Assistive device: Rolling walker (2 wheels) ?Gait Pattern/deviations: Step-through pattern, Decreased stride length ?Gait velocity: decreased ?Gait velocity interpretation: <1.31 ft/sec, indicative of household ambulator ?  ?General Gait Details: pt with slow but generally steady gait. VSS on RA. standing rest break after 75 ft with increased SOB, but SpO2 > 94% ? ?  ? ?Balance Overall balance assessment: Needs assistance ?Sitting-balance support: No upper extremity supported, Feet supported ?Sitting balance-Leahy Scale: Fair ?Sitting  balance - Comments: able to lean outside BOS to don sock and prosthetic ?  ?Standing balance support: Bilateral upper extremity supported ?Standing balance-Leahy Scale: Poor ?Standing balance comment: dependent on BUE support ?  ?  ?  ?   ?  ?  ?  ?  ?  ?  ?  ?   ? ? ? ?Pertinent Vitals/Pain Pain Assessment ?Pain Assessment: No/denies pain  ? ? ?Home Living Family/patient expects to be discharged to:: Private residence ?Living Arrangements: Spouse/significant other ?Available Help at Discharge: Family;Available PRN/intermittently (wife home but had recent hip surgery and unable to assist) ?Type of Home: House ?Home Access: Stairs to enter ?Entrance Stairs-Rails: Can reach both ?Entrance Stairs-Number of Steps: 6-7 ?  ?Home Layout: One level ?Home Equipment: Shower seat;Grab bars - tub/shower;Rolling Walker (2 wheels);Wheelchair - manual;Crutches ?Additional Comments: independent with prosthetic  ?  ?Prior Function Prior Level of Function : Independent/Modified Independent;Driving ?  ?  ?  ?  ?  ?  ?Mobility Comments: independent with use of prosthetic, gardens for activity at home, and walks in mall ?ADLs Comments: independent ?  ? ? ?Hand Dominance  ? Dominant Hand: Right ? ?  ?Extremity/Trunk Assessment  ? Upper Extremity Assessment ?Upper Extremity Assessment: Overall WFL for tasks assessed ?  ? ?Lower Extremity Assessment ?Lower Extremity Assessment: RLE deficits/detail ?RLE Deficits / Details: chronic R BKA, WFL with transfers ?RLE Sensation: WNL ?  ? ?Cervical / Trunk Assessment ?Cervical / Trunk Assessment: Normal  ?Communication  ? Communication: No difficulties  ?Cognition Arousal/Alertness: Awake/alert ?Behavior During Therapy: [facility] for tasks assessed/performed ?Overall Cognitive Status: Within Functional Limits for tasks assessed ?  ?  ?  ?  ?  ?  ?  ?  ?  ?  ?  ?  ?  ?  ?  ?  ?  ?  ?  ? ?  ?General Comments General comments (skin integrity, edema, etc.): RN states trying to get a stool sample for cdiff. VSS on RA ? ?  ?   ? ?Assessment/Plan  ?  ?PT Assessment Patient needs continued PT services  ?PT Problem List Decreased strength;Decreased activity tolerance;Decreased balance ? ?   ?  ?PT Treatment Interventions Gait training;Stair  training;Functional mobility training;Therapeutic activities;Therapeutic exercise;Balance training;Patient/family education   ? ?PT Goals (Current goals can be found in the Care Plan section)  ?Acute Rehab PT Goals ?Patient Stated Goal: return home, resolve diarrhea ?PT Goal Formulation: With patient ?Time For Goal Achievement: 06/15/21 ?Potential to Achieve Goals: Good ? ?  ?Frequency Min 3X/week ?  ? ? ?   ?AM-PAC PT "6 Clicks" Mobility  ?Outcome Measure Help needed turning from your back to your side while in a flat bed without using bedrails?: A Little ?Help needed moving from lying on your back to sitting on the side of a flat bed without using bedrails?: A Little ?Help needed moving to and from a bed to a chair (including a wheelchair)?: A Little ?Help needed standing up from a chair using your arms (e.g., wheelchair or bedside chair)?: A Little ?Help needed to walk in hospital room?: A Little ?Help needed climbing 3-5 steps with a railing? : A Little ?6 Click Score: 18 ? ?  ?End of Session Equipment Utilized During Treatment: Gait belt ?Activity Tolerance: Patient tolerated treatment well ?Patient left: with call bell/phone within reach (on [facility]) ?Nurse Communication: Mobility status ?PT Visit Diagnosis: Other abnormalities of gait and mobility (R26.89);Muscle weakness (generalized) (M62.81) ?  ? ?Time: 1610-9604 ?PT  Time Calculation (min) (ACUTE ONLY): 35 min ? ? ?Charges:   PT Evaluation ?$PT Eval Low Complexity: 1 Low ?PT Treatments ?$Therapeutic Exercise: 8-22 mins ?  ?   ? ? ?Vickki Muff, PT, DPT  ? ?Acute Rehabilitation Department ?Pager #: [phone] ? ?Ronnie Derby ?06/01/2021, 5:45 PM ? ?

## 2021-06-02 DIAGNOSIS — E0810 Diabetes mellitus due to underlying condition with ketoacidosis without coma: Secondary | ICD-10-CM | POA: Diagnosis not present

## 2021-06-02 DIAGNOSIS — J181 Lobar pneumonia, unspecified organism: Secondary | ICD-10-CM | POA: Diagnosis not present

## 2021-06-02 DIAGNOSIS — N179 Acute kidney failure, unspecified: Secondary | ICD-10-CM | POA: Diagnosis not present

## 2021-06-02 LAB — GLUCOSE, CAPILLARY
Glucose-Capillary: 161 mg/dL — ABNORMAL HIGH (ref 70–99)
Glucose-Capillary: 167 mg/dL — ABNORMAL HIGH (ref 70–99)
Glucose-Capillary: 212 mg/dL — ABNORMAL HIGH (ref 70–99)
Glucose-Capillary: 256 mg/dL — ABNORMAL HIGH (ref 70–99)

## 2021-06-02 LAB — BASIC METABOLIC PANEL
Anion gap: 6 (ref 5–15)
BUN: 14 mg/dL (ref 8–23)
CO2: 26 mmol/L (ref 22–32)
Calcium: 7.9 mg/dL — ABNORMAL LOW (ref 8.9–10.3)
Chloride: 103 mmol/L (ref 98–111)
Creatinine, Ser: 0.73 mg/dL (ref 0.61–1.24)
GFR, Estimated: >60 mL/min (ref >60)
Glucose, Bld: 158 mg/dL — ABNORMAL HIGH (ref 70–99)
Potassium: 3.3 mmol/L — ABNORMAL LOW (ref 3.5–5.1)
Sodium: 135 mmol/L (ref 135–145)

## 2021-06-02 LAB — CBC
HCT: 43.3 % (ref 39.0–52.0)
Hemoglobin: 14.1 g/dL (ref 13.0–17.0)
MCH: 27.6 pg (ref 26.0–34.0)
MCHC: 32.6 g/dL (ref 30.0–36.0)
MCV: 84.7 fL (ref 80.0–100.0)
Platelets: 132 10*3/uL — ABNORMAL LOW (ref 150–400)
RBC: 5.11 MIL/uL (ref 4.22–5.81)
RDW: 18.1 % — ABNORMAL HIGH (ref 11.5–15.5)
WBC: 6.3 10*3/uL (ref 4.0–10.5)
nRBC: 0 % (ref 0.0–0.2)

## 2021-06-02 LAB — C DIFFICILE QUICK SCREEN W PCR REFLEX
C Diff antigen: NEGATIVE
C Diff interpretation: NOT DETECTED
C Diff toxin: NEGATIVE

## 2021-06-02 LAB — MAGNESIUM: Magnesium: 2.5 mg/dL — ABNORMAL HIGH (ref 1.7–2.4)

## 2021-06-02 MED ORDER — POTASSIUM CHLORIDE CRYS ER 20 MEQ PO TBCR
40.0000 meq | EXTENDED_RELEASE_TABLET | Freq: Once | ORAL | Status: AC
  Administered 2021-06-02: 40 meq via ORAL
  Filled 2021-06-02: qty 2

## 2021-06-02 NOTE — TOC Initial Note (Signed)
Transition of Care (TOC) - Initial/Assessment Note  ?Donn Pierini Charity fundraiser, BSN ?Transitions of Care ?Unit 4E- RN Case Manager ?See Treatment Team for direct phone #  ? ? ?Patient Details  ?Name: Ricardo Howard ?MRN: [id number] ?Date of Birth: [birth year] ? ?Transition of Care (TOC) CM/SW Contact:    ?Zenda Alpers, Lenn Sink, RN ?Phone Number: ?06/02/2021, 2:00 PM ? ?Clinical Narrative:                 ?Pt from home w/ wife. Anticipate HHPT- orders pending. CM spoke with pt at bedside to discuss transition needs and [facility] choice- per pt he uses [facility] for [facility] needs through the [name]. Pt reports he has all needed DME at home. Family can transport home. Pt has Primary care at the [facility]  ? ?Call made to Amy with [facility] to make [facility] referral - awaiting return call.  ?1410- spoke with Misty Stanley at [facility] for HHPT referral-  referral accepted pending orders and [facility] auth- Misty Stanley will f/u and contact [facility] for auth once order has been placed.  ? ?Expected Discharge Plan: Home w Home Health Services ?Barriers to Discharge: Continued Medical Work up ? ? ?Patient Goals and CMS Choice ?Patient states their goals for this hospitalization and ongoing recovery are:: return home w/ wife ?CMS Medicare.gov Compare Post Acute Care list provided to:: Patient ?Choice offered to / list presented to : Patient ? ?Expected Discharge Plan and Services ?Expected Discharge Plan: Home w Home Health Services ?  ?Discharge Planning Services: CM Consult ?Post Acute Care Choice: Home Health ?Living arrangements for the past 2 months: Single Family Home ?                ?DME Arranged: N/A ?DME Agency: NA ?  ?  ?  ?HH Arranged: PT ?HH Agency: [facility] ?Date HH Agency Contacted: 06/02/21 ?Time HH Agency Contacted: 1347 ?Representative spoke with at [facility] Agency: Amy ? ?Prior Living Arrangements/Services ?Living arrangements for the past 2 months: Single Family Home ?Lives with:: Spouse ?Patient language and need for interpreter reviewed:: Yes ?Do you feel  safe going back to the place where you live?: Yes      ?Need for Family Participation in Patient Care: Yes (Comment) ?Care giver support system in place?: Yes (comment) ?Current home services: DME ?Criminal Activity/Legal Involvement Pertinent to Current Situation/Hospitalization: No - Comment as needed ? ?Activities of Daily Living ?  ?  ? ?Permission Sought/Granted ?Permission sought to share information with : Magazine features editor ?Permission granted to share information with : Yes, Verbal Permission Granted ?   ? Permission granted to share info w AGENCY: HH ?   ?   ? ?Emotional Assessment ?Appearance:: Appears stated age ?Attitude/Demeanor/Rapport: Engaged ?Affect (typically observed): Accepting, Appropriate, Pleasant ?Orientation: : Oriented to Self, Oriented to Place, Oriented to  Time, Oriented to Situation ?Alcohol / Substance Use: Not Applicable ?Psych Involvement: No (comment) ? ?Admission diagnosis:  DKA (diabetic ketoacidosis) (HCC) [E11.10] ?Community acquired pneumonia of right lower lobe of lung [J18.9] ?Patient Active Problem List  ? Diagnosis Date Noted  ? DKA (diabetic ketoacidosis) (HCC) 05/31/2021  ? Tobacco abuse 07/18/2020  ? 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  ? Pancreatitis 10/07/2019  ? Dehydration 10/07/2019  ? Hyperglycemia 10/07/2019  ? Occult blood in stools   ? Mixed hyperlipidemia due to type 2 diabetes mellitus (HCC) 07/31/2019  ? Uncontrolled type 2 diabetes mellitus with diabetic polyneuropathy, with long-term current use of insulin 07/31/2019  ? Nicotine dependence,  cigarettes, uncomplicated 07/31/2019  ? Complaint of melena 07/31/2019  ? Pancreatic duct calculus 06/21/2019  ? Elevated LFTs 06/21/2019  ? DKA (diabetic ketoacidoses) 06/14/2019  ? Acute pancreatitis 06/14/2019  ? Alcohol abuse 06/14/2019  ? Essential hypertension 06/14/2019  ? ?PCP:  Clinic, Lenn Sink ?Pharmacy:   ?[id number] [place], [place] - [address] AT [facility] OF  [address] [facility] [address] [facility] [address] ?[address] [place] [address] ?Phone: [phone] Fax: [phone] ? ?[facility] ?[address] ?[place] [address] ?Phone: [phone] Fax: [phone] ? ?[facility] - [place], [place] - [address] ?[phone] ?[place] [address] ?Phone: [phone] Fax: [phone] ? ? ? ? ?Social Determinants of Health (SDOH) Interventions ?  ? ?Readmission Risk Interventions ?   ? View : No data to display.  ?  ?  ?  ? ? ? ?

## 2021-06-02 NOTE — Progress Notes (Signed)
?PROGRESS NOTE ? ? ? ?Ricardo DrainLee Allen Weakland  ZOX:[id number] DOB: 11/10/1960 DOA: 05/30/2021 ?[facility]  ? ? ? ?Brief Narrative:  ? ?Ricardo Howard is a 61 y.o. male with medical history significant of  HTN, DMII, HLD, chronic pancreatitis, who presents with 3 day history of fever, HA , epigastric pain that radiate to lower chest , near syncope, fatigue/generalized malaise.  On evaluation in ED patient was found to have RLL PNA, AKI as well as uncontrolled DMII with elevated sugars and DKA.  Patient notes he has not been  fully complaint with his diabetic regimen ? ?Subjective: ? ?Right side chest pain is improving,  ab pain has resolved, still has poor appetite and feeling weak ?Diarrhea slowed down, cdiff test negative ?Fever resolved, WBC normalized ? ?Assessment & Plan: ? Principal Problem: ?  DKA (diabetic ketoacidosis) (HCC) ? ? ?Lobar pneumonia with sepsis present on admission ?-Blood culture no growth, MRSA screen negative, respiratory viral panel negative, COVID screening negative ?-Improving on current antibiotic regimen ?--chest Imaging follow-up in the next few weeks to resolution recommended to exclude underlying ?neoplasm ? ?Atypical chest pain , likely due to pneumonia ?-ce flat. ekg no hyperacute findings  ? ? ?Chronic combined CHF:  ?-echo with poor cardiac window, not able to assess wall motion abnormalities, LVEF 40 to 45%, grade 1 diastolic dysfunction ?-continue to monitor on tele  ?-outpatient cardiology follow up, [facility], next appointment next week.  ?-[facility] meds [facility] ?Plan to resume at discharge, currently no edema, appear euvolemic with poor appetite ? ?Uncontrolled DMII with DKA, present on admission ?-insetting of CAP and partial compliance with medication  ?-Transitioned off insulin drip , now on subcu insulin , continue to adjust insulin as needed  ?-Hold metformin, plan to resume at discharge ?-A.m. blood glucose  158 ?  ? AKI ?-hold nephrotoxic medications ?-Resolved ?-Likely able to resume Lasix and Cozaar in the next 1 to 2 days ? ?Hypokalemia ?Replace K, recheck in the morning ?  ?  ?HTN  ?- carvedilol , adjust dose as needed ?Hold for amlodipine  ?-hold arb due to AKI ?-Hold Imdur ?-Likely able to gradually start in the next 1 to 2 days ? ?CAD status post stent ?PVD ?Continue home medication aspirin Plavix Lipitor ?  ? ?Right BKA, having prosthesis ? ?FTT/weakness, seen by PT, recommended home health ? ?Class III obesity: Body mass index is 40.29 kg/m?Marland Kitchen.. ? ?Hx of ETOH pancreatitis  ?-etoh in remission  ?- lipase unremarkable ?-Continue pancreas enzyme supplement ? ?Cigarette smoking, smoking cessation education provided, state trying to cut down ? ? ? ?I have Reviewed nursing notes, Vitals, pain scores, I/o's, Lab results and  imaging results since pt's last encounter, details please see discussion above  ?I ordered the following labs:  ?Unresulted Labs (From admission, onward)  ? ?  Start     Ordered  ? 06/03/21 0500  Hemoglobin A1c  Tomorrow morning,   R       ? 06/02/21 1048  ? 06/03/21 0500  CBC with Differential/Platelet  Tomorrow morning,   R       ? 06/02/21 1438  ? 06/03/21 0500  Basic metabolic panel  Tomorrow morning,   R       ? 06/02/21 1438  ? ?  ?  ? ?  ? ? ? ?DVT prophylaxis: heparin injection 5,000 Units Start: 05/31/21 0600 ?SCDs Start: 05/31/21 0459 ? ? ?Code Status:  Code Status: Full Code ? ?Family Communication: Patient ?Disposition:  ? ?Status is: Inpatient ? ?Dispo: The patient is from: Home ?             Anticipated d/c is to: Home with home health ?             Anticipated d/c date is: likely 4/15 ? ?Antimicrobials:   ?Anti-infectives (From admission, onward)  ? ? Start     Dose/Rate Route Frequency Ordered Stop  ? 05/31/21 2200  cefTRIAXone (ROCEPHIN) 2 g in sodium chloride 0.9 % 100 mL IVPB       ? 2 g ?200 mL/hr over 30 Minutes Intravenous Every 24 hours 05/31/21 0501 06/05/21 2159  ?  05/31/21 1000  azithromycin (ZITHROMAX) tablet 500 mg  Status:  Discontinued       ? 500 mg Oral Daily 05/30/21 2355 05/31/21 0504  ? 05/31/21 0515  azithromycin (ZITHROMAX) 500 mg in sodium chloride 0.9 % 250 mL IVPB       ? 500 mg ?250 mL/hr over 60 Minutes Intravenous Every 24 hours 05/31/21 0501 06/05/21 0514  ? 05/31/21 0000  cefTRIAXone (ROCEPHIN) 1 g in sodium chloride 0.9 % 100 mL IVPB       ? 1 g ?200 mL/hr over 30 Minutes Intravenous  Once 05/30/21 2355 05/31/21 0144  ? ?  ? ? ? ? ? ? ?Objective: ?Vitals:  ? 06/01/21 2321 06/02/21 0522 06/02/21 0852 06/02/21 1129  ?BP: 114/74 125/82 118/72 134/83  ?Pulse: 83 81 82 83  ?Resp: 20 18 16 18   ?Temp: 98.2 ?F (36.8 ?C) 98 ?F (36.7 ?C) 97.6 ?F (36.4 ?C) 97.8 ?F (36.6 ?C)  ?TempSrc: Oral Oral Oral Oral  ?SpO2: 96% 94% 93% 97%  ?Weight:      ?Height:      ? ? ?Intake/Output Summary (Last 24 hours) at 06/02/2021 1445 ?Last data filed at 06/02/2021 06/04/2021 ?Gross per 24 hour  ?Intake --  ?Output 1000 ml  ?Net -1000 ml  ? ?Filed Weights  ? 05/30/21 2007  ?Weight: 120.2 kg  ? ? ?Examination: ? ?General exam: Appears stronger ?Respiratory system: Clear to auscultation. Respiratory effort normal. ?Cardiovascular system:  RRR.  ?Gastrointestinal system: Abdomen is nondistended, soft and nontender.  Normal bowel sounds heard. ?Central nervous system: Alert and oriented. No focal neurological deficits. ?Extremities: Right BKA ?Skin: No rashes, lesions or ulcers ?Psychiatry: Judgement and insight appear normal. Mood & affect appropriate.  ? ? ? ?Data Reviewed: I have personally reviewed  labs and visualized  imaging studies since the last encounter and formulate the plan  ? ? ? ? ? ? ?Scheduled Meds: ? aspirin  81 mg Oral Daily  ? carvedilol  6.25 mg Oral BID WC  ? clopidogrel  75 mg Oral Daily  ? folic acid  1 mg Oral Daily  ? heparin  5,000 Units Subcutaneous Q8H  ? insulin aspart  0-15 Units Subcutaneous TID WC  ? insulin glargine-yfgn  40 Units Subcutaneous Daily  ?  lipase/protease/amylase  36,000 Units Oral TID AC  ? tamsulosin  0.4 mg Oral Daily  ? thiamine  100 mg Oral Daily  ? ?Continuous Infusions: ? sodium chloride 10 mL/hr at 05/31/21 2239  ? azithromycin 500 mg (06/02/21 0521)  ? cefTRIAXone (ROCEPHIN)  IV 2 g (06/01/21 2115)  ? ? ? LOS: 2 days  ? ? ? ?2116, MD PhD FACP ?[facility] ? ?Available via Epic secure chat 7am-7pm for nonurgent issues ?Please page for urgent issues ?  To page the attending provider between 7A-7P or the covering provider during after hours 7P-7A, please log into the web site [email or web address] and access using universal [place] password for that web site. If you do not have the password, please call the hospital operator. ? ? ? ?06/02/2021, 2:45 PM  ?

## 2021-06-03 DIAGNOSIS — I5042 Chronic combined systolic (congestive) and diastolic (congestive) heart failure: Secondary | ICD-10-CM

## 2021-06-03 DIAGNOSIS — K8020 Calculus of gallbladder without cholecystitis without obstruction: Secondary | ICD-10-CM

## 2021-06-03 DIAGNOSIS — Z89511 Acquired absence of right leg below knee: Secondary | ICD-10-CM

## 2021-06-03 LAB — CBC WITH DIFFERENTIAL/PLATELET
Abs Immature Granulocytes: 0.05 10*3/uL (ref 0.00–0.07)
Basophils Absolute: 0 10*3/uL (ref 0.0–0.1)
Basophils Relative: 1 %
Eosinophils Absolute: 0.2 10*3/uL (ref 0.0–0.5)
Eosinophils Relative: 4 %
HCT: 46.3 % (ref 39.0–52.0)
Hemoglobin: 14.8 g/dL (ref 13.0–17.0)
Immature Granulocytes: 1 %
Lymphocytes Relative: 32 %
Lymphs Abs: 1.6 10*3/uL (ref 0.7–4.0)
MCH: 27.4 pg (ref 26.0–34.0)
MCHC: 32 g/dL (ref 30.0–36.0)
MCV: 85.6 fL (ref 80.0–100.0)
Monocytes Absolute: 0.5 10*3/uL (ref 0.1–1.0)
Monocytes Relative: 11 %
Neutro Abs: 2.5 10*3/uL (ref 1.7–7.7)
Neutrophils Relative %: 51 %
Platelets: 152 10*3/uL (ref 150–400)
RBC: 5.41 MIL/uL (ref 4.22–5.81)
RDW: 18.5 % — ABNORMAL HIGH (ref 11.5–15.5)
WBC: 4.9 10*3/uL (ref 4.0–10.5)
nRBC: 0 % (ref 0.0–0.2)

## 2021-06-03 LAB — HEMOGLOBIN A1C
Hgb A1c MFr Bld: 10.1 % — ABNORMAL HIGH (ref 4.8–5.6)
Mean Plasma Glucose: 243.17 mg/dL

## 2021-06-03 LAB — BASIC METABOLIC PANEL
Anion gap: 5 (ref 5–15)
BUN: 10 mg/dL (ref 8–23)
CO2: 27 mmol/L (ref 22–32)
Calcium: 8.2 mg/dL — ABNORMAL LOW (ref 8.9–10.3)
Chloride: 108 mmol/L (ref 98–111)
Creatinine, Ser: 0.73 mg/dL (ref 0.61–1.24)
GFR, Estimated: >60 mL/min (ref >60)
Glucose, Bld: 234 mg/dL — ABNORMAL HIGH (ref 70–99)
Potassium: 4.1 mmol/L (ref 3.5–5.1)
Sodium: 140 mmol/L (ref 135–145)

## 2021-06-03 LAB — GLUCOSE, CAPILLARY: Glucose-Capillary: 227 mg/dL — ABNORMAL HIGH (ref 70–99)

## 2021-06-03 MED ORDER — PANTOPRAZOLE SODIUM 20 MG PO TBEC
20.0000 mg | DELAYED_RELEASE_TABLET | Freq: Every day | ORAL | Status: DC
  Administered 2021-06-03: 20 mg via ORAL
  Filled 2021-06-03: qty 1

## 2021-06-03 MED ORDER — SENNOSIDES-DOCUSATE SODIUM 8.6-50 MG PO TABS
1.0000 | ORAL_TABLET | Freq: Two times a day (BID) | ORAL | Status: DC
  Administered 2021-06-03: 1 via ORAL
  Filled 2021-06-03: qty 1

## 2021-06-03 MED ORDER — AMOXICILLIN-POT CLAVULANATE 875-125 MG PO TABS: 1.0000 | ORAL_TABLET | Freq: Two times a day (BID) | ORAL | 0 refills | Status: AC

## 2021-06-03 MED ORDER — INSULIN GLARGINE 100 UNIT/ML SOLOSTAR PEN: PEN_INJECTOR | SUBCUTANEOUS | 0 refills | Status: AC

## 2021-06-03 MED ORDER — AMOXICILLIN-POT CLAVULANATE 875-125 MG PO TABS
1.0000 | ORAL_TABLET | Freq: Two times a day (BID) | ORAL | Status: DC
  Administered 2021-06-03: 1 via ORAL
  Filled 2021-06-03: qty 1

## 2021-06-03 MED ORDER — POLYETHYLENE GLYCOL 3350 17 G PO PACK
17.0000 g | PACK | Freq: Every day | ORAL | Status: DC
  Administered 2021-06-03: 17 g via ORAL
  Filled 2021-06-03: qty 1

## 2021-06-03 NOTE — TOC CM/SW Note (Signed)
Confirmed with Amy with [facility] that Iantha Fallen will follow up with patient for home health services. Made Amy aware that [facility] is discharging home today.  ? ? ?Raiford Noble, MSN, RN,BSN ?Inpatient [facility] Case Manager ?[phone]   ?

## 2021-06-03 NOTE — Discharge Summary (Signed)
?Discharge Summary ? ?Ricardo Howard ZOX:[id number] DOB: December 03, 1960 ? ?[facility] ? ?Admit date: 05/30/2021 ?Discharge date: 06/03/2021 ? ?Time spent: , more than 50% time spent on coordination of care.  ? ?Recommendations for Outpatient Follow-up:  ?[facility]  for [facility] discharge follow up, repeat cbc/bmp at follow up, patient is advised to check blood pressure and blood glucose at home and bring in record for pcp to review, pcp to repeat Ct chest in 6-8 weeks to ensure resolution of pna, patient to follow up with pcp regarding symptomatic gallstone  ?[facility] ?F/u with endocrinology  ?Home health PT ? ? ?Discharge Diagnoses:  ?Active Hospital Problems  ? Diagnosis Date Noted  ? DKA (diabetic ketoacidosis) (HCC) 05/31/2021  ?  ?Resolved Hospital Problems  ?No resolved problems to display.  ? ? ?Discharge Condition: stable ? ?Diet recommendation: heart healthy/carb modified ? ?Filed Weights  ? 05/30/21 2007  ?Weight: 120.2 kg  ? ? ?History of present illness: ( per admitting  MD Dr Maisie Fus) ?Chief Complaint: chest pain/ dizziness ?  ?HPI: Dantrell Schertzer is a 61 y.o. male with medical history significant of  HTN, DMII, HLD, chronic pancreatitis, who presents with 3 day history of fever, HA , epigastric pain that radiate to lower chest , near syncope, fatigue/generalized malaise.  On evaluation in ED patient was found to have RLL PNA, AKI as well as uncontrolled DMII with elevated sugars and DKA.  Patient notes he has not been  fully complaint with his diabetic regimen.  ON further ros he denies any symptoms suggestive of cardiac chest pain.  ?  ?  ?ED Course: ?Tmx100.2, Bp 152/100, hr 133, rr 24, sat 94%  ? Patient started on antibiotics and endo stool .  In reference to chest pain , patient CE were flat  ?With ekg notable for sinus tachycardia to 140's with st rate related changes.  ?  ?Labs notable for  ?Wbc 11.5,  hgb 16.9,  pmn 10 ?Lactic 2,2.6 ,  beta-hydroxybutyric acid:5.78 ?NA 132( base 141) cr 1.22  (0.9) aG 20 ?Ce 27,26 ?Bnp 67 ?UA:+ gluc,+ketones ?Ph:7.4/22.9 ?1. Moderate to marked severity right middle lobe infiltrate. ?2. Hepatic steatosis. ?3. Cholelithiasis. ?4. Findings within the region of the pancreatic head which may ?represent sequelae associated with chronic pancreatitis. ?5. Bilateral, diffusely enlarged, mildly nodular appearing adrenal ?glands which may represent small adrenal adenomas versus adrenal ?hyperplasia. ?6. Aortic atherosclerosis. ?  ? ?Hospital Course:  ?Principal Problem: ?  DKA (diabetic ketoacidosis) (HCC) ? ? ?Lobar pneumonia with sepsis present on admission ?-Blood culture no growth, MRSA screen negative, respiratory viral panel negative, COVID screening negative ?-much improved with rocephin and zithromax, he desires to go home, he will be discharged on Augmentin for three days to finish total of 7 days antibiotic course ?-Is to follow-up with PCP next week ?--PCP to repeat chest CT in the next few weeks to resolution recommended to exclude underlying neoplasm ?  ?Atypical chest pain , likely due to pneumonia ?-ce flat. ekg no hyperacute findings  ?-chest pain resolved  ?  ?  ?Chronic combined CHF:  ?-echo with poor cardiac window, not able to assess wall motion abnormalities, showed LVEF 40 to 45%, grade 1 diastolic dysfunction ?-he follows cardiology at [facility], next appointment next week.  ?-Home meds lasix held due to aki and pna, resumed at discharge ?-currently no edema, appear euvolemic  ?  ?Uncontrolled DMII with DKA, present on admission ?-insetting  of CAP and possible compliance with medication and meds, a1c 10.1 ?-Transitioned off insulin drip , now on subcu insulin ?-metformin held in the hospital, resumed at discharge ?-f/u with pcp ? ?  ? AKI ?-hold nephrotoxic medications ?-Resolved ?-resume Lasix  ?-reports he dose not take Cozaar due to side effect cough ?  ?Hypokalemia ?Replaced and normalized ?   ?  ?HTN  ?- bp meds held initially ,gradually restarted ?-he reports only take coreg and imdur at home, theses are resumed at discharge  ?  ?CAD status post stent ?PVD ?Continue home medication aspirin Plavix Lipitor ?  ?  ?Right BKA, having prosthesis ?  ?FTT/weakness, seen by PT, recommended home health ?  ?Class III obesity: Body mass index is 40.29 kg/m?Marland Kitchen.. ?  ?Hx of ETOH pancreatitis  ?-etoh in remission  ?- lipase unremarkable ?-Continue pancreas enzyme supplement ?  ?Cigarette smoking, smoking cessation education provided, state trying to cut down ? ?Gallstone, reports having intermittent RUQ and epigastric pain once a few months ?LFT and lipase this hospitalization is unremarkable ?Pcp to follow up on possibly symptomatic gallstone, may need surgery to remove gallbladder if symptoms are frequent or severe ? ?Incidental findings on CT imaging, need to follow-up outpatient with PCP: ?-Hepatic steatosis, Cholelithiasis ?- Findings within the region of the pancreatic head which may represent sequelae associated with chronic pancreatitis. ?- Bilateral, diffusely enlarged, mildly nodular appearing adrenal glands which may represent small adrenal adenomas versus adrenal ?hyperplasia. ?-Aortic atherosclerosis. ? ? ?Discharge Exam: ?BP (!) 137/92 (BP Location: Right Arm)   Pulse 81   Temp (!) 97.4 ?F (36.3 ?C) (Oral)   Resp 20   Ht 5\' 8"  (1.727 m)   Wt 120.2 kg   SpO2 95%   BMI 40.29 kg/m?  ? ?General: NAD, pleasant , much improved  ?Cardiovascular: RRR ?Respiratory: normal respiratory effort  ? ? ? ?Discharge Instructions   ? ? Diet - low sodium heart healthy   Complete by: As directed ?  ? Carb modified diet ?Low fat  ? Increase activity slowly   Complete by: As directed ?  ? ?  ? ?Allergies as of 06/03/2021   ? ?   Reactions  ? Iohexol Hives  ? Patient broke out in hives after injection of Omni 300, will need 13 hour pre-med in future  ? Semaglutide Other (See Comments)  ? Pancreatitis ?Ozempic  ? Lac Bovis  Diarrhea  ? Finding of gastrointestinal tract gas  ? Lisinopril Cough  ? Simvastatin Itching, Cough  ? ?  ? ?  ?Medication List  ?  ? ?STOP taking these medications   ? ?amLODipine 5 MG tablet ?Commonly known as: NORVASC ?  ?losartan 100 MG tablet ?Commonly known as: COZAAR ?  ?pregabalin 150 MG capsule ?Commonly known as: LYRICA ?  ? ?  ? ?TAKE these medications   ? ?acetaminophen 500 MG tablet ?Commonly known as: TYLENOL ?Take 1,000 mg by mouth 2 (two) times daily as needed for mild pain. ?  ?amoxicillin-clavulanate 875-125 MG tablet ?Commonly known as: AUGMENTIN ?Take 1 tablet by mouth every 12 (twelve) hours for 3 days. ?  ?aspirin 81 MG tablet ?Take 81 mg by mouth daily. ?  ?atorvastatin 80 MG tablet ?Commonly known as: LIPITOR ?Take 80 mg by mouth at bedtime. ?  ?buPROPion 150 MG 12 hr tablet ?Commonly known as: WELLBUTRIN SR ?Take 150 mg by mouth in the morning and at bedtime. ?  ?carvedilol 25 MG tablet ?Commonly known as: COREG ?Take 12.5 mg  by mouth 2 (two) times daily with a meal. ?  ?clopidogrel 75 MG tablet ?Commonly known as: Plavix ?Take 1 tablet (75 mg total) by mouth daily. ?  ?empagliflozin 25 MG Tabs tablet ?Commonly known as: JARDIANCE ?Take 12.5 mg by mouth daily. ?  ?folic acid 1 MG tablet ?Commonly known as: FOLVITE ?Take 1 tablet (1 mg total) by mouth daily. ?  ?furosemide 20 MG tablet ?Commonly known as: LASIX ?Take 10 mg by mouth daily. ?  ?gabapentin 300 MG capsule ?Commonly known as: NEURONTIN ?Take 300-600 mg by mouth See admin instructions. Take 300 mg morning and noon. Take 600 mg at bedtime ?  ?insulin aspart 100 UNIT/ML FlexPen ?Commonly known as: NOVOLOG ?Inject 4 Units into the skin 3 (three) times daily with meals. ?What changed:  ?how much to take ?when to take this ?  ?insulin glargine 100 UNIT/ML Solostar Pen ?Commonly known as: LANTUS ?30units in am and 35unit at night  ?Further adjustment per your pcp and endocrinology ?Goal of a1c is less than 7%, currently your a1c is 10%,  not at goal ?What changed:  ?how much to take ?how to take this ?when to take this ?additional instructions ?  ?Insulin Pen Needle 30G X 8 MM Misc ?Commonly known as: NOVOFINE ?Inject 10 each into the skin as needed.

## 2021-06-03 NOTE — Progress Notes (Signed)
Pt being d/c, VSS, IV removed, Tele returned, education complete.  ? ?Balinda Quails, RN ?06/03/2021 ?10:59 AM  ?

## 2021-06-05 LAB — CULTURE, BLOOD (ROUTINE X 2)
Culture: NO GROWTH
Culture: NO GROWTH
Special Requests: ADEQUATE
Special Requests: ADEQUATE

## 2021-07-16 ENCOUNTER — Encounter (HOSPITAL_COMMUNITY)
Admission: EM | Disposition: A | Discharge status: Home Health | Payer: Self-pay | Source: Home / Self Care | Attending: Internal Medicine

## 2021-07-16 ENCOUNTER — Emergency Department (HOSPITAL_COMMUNITY): Payer: No Typology Code available for payment source

## 2021-07-16 ENCOUNTER — Encounter (HOSPITAL_COMMUNITY): Payer: Self-pay

## 2021-07-16 ENCOUNTER — Other Ambulatory Visit: Payer: Self-pay

## 2021-07-16 ENCOUNTER — Inpatient Hospital Stay (HOSPITAL_COMMUNITY)
Admission: EM | Admit: 2021-07-16 | Discharge: 2021-07-19 | DRG: 247 | Disposition: A | Discharge status: Home Health | Payer: No Typology Code available for payment source | Attending: Internal Medicine | Admitting: Internal Medicine

## 2021-07-16 DIAGNOSIS — E11319 Type 2 diabetes mellitus with unspecified diabetic retinopathy without macular edema: Secondary | ICD-10-CM | POA: Diagnosis present

## 2021-07-16 DIAGNOSIS — F4310 Post-traumatic stress disorder, unspecified: Secondary | ICD-10-CM | POA: Diagnosis present

## 2021-07-16 DIAGNOSIS — K219 Gastro-esophageal reflux disease without esophagitis: Secondary | ICD-10-CM | POA: Diagnosis present

## 2021-07-16 DIAGNOSIS — Z89511 Acquired absence of right leg below knee: Secondary | ICD-10-CM

## 2021-07-16 DIAGNOSIS — I214 Non-ST elevation (NSTEMI) myocardial infarction: Principal | ICD-10-CM | POA: Diagnosis present

## 2021-07-16 DIAGNOSIS — E1169 Type 2 diabetes mellitus with other specified complication: Secondary | ICD-10-CM | POA: Diagnosis present

## 2021-07-16 DIAGNOSIS — Z79899 Other long term (current) drug therapy: Secondary | ICD-10-CM | POA: Diagnosis not present

## 2021-07-16 DIAGNOSIS — E1140 Type 2 diabetes mellitus with diabetic neuropathy, unspecified: Secondary | ICD-10-CM

## 2021-07-16 DIAGNOSIS — Z91013 Allergy to seafood: Secondary | ICD-10-CM

## 2021-07-16 DIAGNOSIS — Z888 Allergy status to other drugs, medicaments and biological substances status: Secondary | ICD-10-CM

## 2021-07-16 DIAGNOSIS — I5020 Unspecified systolic (congestive) heart failure: Secondary | ICD-10-CM

## 2021-07-16 DIAGNOSIS — E1142 Type 2 diabetes mellitus with diabetic polyneuropathy: Secondary | ICD-10-CM | POA: Diagnosis present

## 2021-07-16 DIAGNOSIS — E1151 Type 2 diabetes mellitus with diabetic peripheral angiopathy without gangrene: Secondary | ICD-10-CM | POA: Diagnosis present

## 2021-07-16 DIAGNOSIS — K861 Other chronic pancreatitis: Secondary | ICD-10-CM | POA: Diagnosis present

## 2021-07-16 DIAGNOSIS — E782 Mixed hyperlipidemia: Secondary | ICD-10-CM | POA: Diagnosis present

## 2021-07-16 DIAGNOSIS — I5022 Chronic systolic (congestive) heart failure: Secondary | ICD-10-CM | POA: Diagnosis present

## 2021-07-16 DIAGNOSIS — F1010 Alcohol abuse, uncomplicated: Secondary | ICD-10-CM | POA: Diagnosis present

## 2021-07-16 DIAGNOSIS — Z794 Long term (current) use of insulin: Secondary | ICD-10-CM

## 2021-07-16 DIAGNOSIS — I110 Hypertensive heart disease with heart failure: Secondary | ICD-10-CM | POA: Diagnosis present

## 2021-07-16 DIAGNOSIS — Z7984 Long term (current) use of oral hypoglycemic drugs: Secondary | ICD-10-CM

## 2021-07-16 DIAGNOSIS — Z7902 Long term (current) use of antithrombotics/antiplatelets: Secondary | ICD-10-CM

## 2021-07-16 DIAGNOSIS — E876 Hypokalemia: Secondary | ICD-10-CM | POA: Diagnosis present

## 2021-07-16 DIAGNOSIS — E669 Obesity, unspecified: Secondary | ICD-10-CM | POA: Diagnosis present

## 2021-07-16 DIAGNOSIS — Z8249 Family history of ischemic heart disease and other diseases of the circulatory system: Secondary | ICD-10-CM

## 2021-07-16 DIAGNOSIS — F419 Anxiety disorder, unspecified: Secondary | ICD-10-CM | POA: Insufficient documentation

## 2021-07-16 DIAGNOSIS — Z6834 Body mass index (BMI) 34.0-34.9, adult: Secondary | ICD-10-CM | POA: Diagnosis not present

## 2021-07-16 DIAGNOSIS — Z833 Family history of diabetes mellitus: Secondary | ICD-10-CM

## 2021-07-16 DIAGNOSIS — I10 Essential (primary) hypertension: Secondary | ICD-10-CM | POA: Diagnosis present

## 2021-07-16 DIAGNOSIS — I2510 Atherosclerotic heart disease of native coronary artery without angina pectoris: Secondary | ICD-10-CM

## 2021-07-16 DIAGNOSIS — G4733 Obstructive sleep apnea (adult) (pediatric): Secondary | ICD-10-CM | POA: Diagnosis present

## 2021-07-16 DIAGNOSIS — T82855A Stenosis of coronary artery stent, initial encounter: Secondary | ICD-10-CM | POA: Diagnosis present

## 2021-07-16 DIAGNOSIS — K5900 Constipation, unspecified: Secondary | ICD-10-CM | POA: Diagnosis present

## 2021-07-16 DIAGNOSIS — F17210 Nicotine dependence, cigarettes, uncomplicated: Secondary | ICD-10-CM | POA: Diagnosis present

## 2021-07-16 DIAGNOSIS — F32A Depression, unspecified: Secondary | ICD-10-CM | POA: Diagnosis present

## 2021-07-16 DIAGNOSIS — E875 Hyperkalemia: Secondary | ICD-10-CM

## 2021-07-16 DIAGNOSIS — Z955 Presence of coronary angioplasty implant and graft: Secondary | ICD-10-CM

## 2021-07-16 DIAGNOSIS — Z7982 Long term (current) use of aspirin: Secondary | ICD-10-CM

## 2021-07-16 HISTORY — DX: Peripheral vascular disease, unspecified: I73.9

## 2021-07-16 HISTORY — DX: Unspecified cataract: H26.9

## 2021-07-16 HISTORY — DX: Type 2 diabetes mellitus with unspecified diabetic retinopathy without macular edema: E11.319

## 2021-07-16 HISTORY — DX: Unspecified viral hepatitis C without hepatic coma: B19.20

## 2021-07-16 HISTORY — DX: Anxiety disorder, unspecified: F41.9

## 2021-07-16 HISTORY — DX: Hyperlipidemia, unspecified: E78.5

## 2021-07-16 HISTORY — DX: Depression, unspecified: F32.A

## 2021-07-16 HISTORY — PX: LEFT HEART CATH AND CORONARY ANGIOGRAPHY: CATH118249

## 2021-07-16 HISTORY — DX: Heart failure, unspecified: I50.9

## 2021-07-16 HISTORY — DX: Acquired absence of right leg below knee: Z89.511

## 2021-07-16 HISTORY — DX: Essential (primary) hypertension: I10

## 2021-07-16 HISTORY — DX: Dorsalgia, unspecified: M54.9

## 2021-07-16 HISTORY — DX: Post-traumatic stress disorder, unspecified: F43.10

## 2021-07-16 HISTORY — PX: CORONARY STENT INTERVENTION: CATH118234

## 2021-07-16 LAB — CBC WITH DIFFERENTIAL/PLATELET
Abs Immature Granulocytes: 0.05 10*3/uL (ref 0.00–0.07)
Basophils Absolute: 0 10*3/uL (ref 0.0–0.1)
Basophils Relative: 0 %
Eosinophils Absolute: 0 10*3/uL (ref 0.0–0.5)
Eosinophils Relative: 0 %
HCT: 49.6 % (ref 39.0–52.0)
Hemoglobin: 16.2 g/dL (ref 13.0–17.0)
Immature Granulocytes: 1 %
Lymphocytes Relative: 10 %
Lymphs Abs: 0.9 10*3/uL (ref 0.7–4.0)
MCH: 28.9 pg (ref 26.0–34.0)
MCHC: 32.7 g/dL (ref 30.0–36.0)
MCV: 88.4 fL (ref 80.0–100.0)
Monocytes Absolute: 1 10*3/uL (ref 0.1–1.0)
Monocytes Relative: 11 %
Neutro Abs: 6.6 10*3/uL (ref 1.7–7.7)
Neutrophils Relative %: 78 %
Platelets: 166 10*3/uL (ref 150–400)
RBC: 5.61 MIL/uL (ref 4.22–5.81)
RDW: 20.6 % — ABNORMAL HIGH (ref 11.5–15.5)
WBC: 8.5 10*3/uL (ref 4.0–10.5)
nRBC: 0 % (ref 0.0–0.2)

## 2021-07-16 LAB — I-STAT VENOUS BLOOD GAS, ED
Acid-Base Excess: 2 mmol/L (ref 0.0–2.0)
Bicarbonate: 27.3 mmol/L (ref 20.0–28.0)
Calcium, Ion: 1.01 mmol/L — ABNORMAL LOW (ref 1.15–1.40)
HCT: 50 % (ref 39.0–52.0)
Hemoglobin: 17 g/dL (ref 13.0–17.0)
O2 Saturation: 99 %
Potassium: 6.8 mmol/L (ref 3.5–5.1)
Sodium: 136 mmol/L (ref 135–145)
TCO2: 29 mmol/L (ref 22–32)
pCO2, Ven: 44.8 mmHg (ref 44–60)
pH, Ven: 7.393 (ref 7.25–7.43)
pO2, Ven: 135 mmHg — ABNORMAL HIGH (ref 32–45)

## 2021-07-16 LAB — BASIC METABOLIC PANEL
Anion gap: 10 (ref 5–15)
BUN: 6 mg/dL — ABNORMAL LOW (ref 8–23)
CO2: 22 mmol/L (ref 22–32)
Calcium: 8.8 mg/dL — ABNORMAL LOW (ref 8.9–10.3)
Chloride: 104 mmol/L (ref 98–111)
Creatinine, Ser: 0.68 mg/dL (ref 0.61–1.24)
GFR, Estimated: >60 mL/min (ref >60)
Glucose, Bld: 297 mg/dL — ABNORMAL HIGH (ref 70–99)
Potassium: 4 mmol/L (ref 3.5–5.1)
Sodium: 136 mmol/L (ref 135–145)

## 2021-07-16 LAB — TSH: TSH: 0.58 u[IU]/mL (ref 0.350–4.500)

## 2021-07-16 LAB — GLUCOSE, CAPILLARY
Glucose-Capillary: 499 mg/dL — ABNORMAL HIGH (ref 70–99)
Glucose-Capillary: 511 mg/dL (ref 70–99)

## 2021-07-16 LAB — COMPREHENSIVE METABOLIC PANEL
ALT: 42 U/L (ref 0–44)
AST: 79 U/L — ABNORMAL HIGH (ref 15–41)
Albumin: 3.7 g/dL (ref 3.5–5.0)
Alkaline Phosphatase: 87 U/L (ref 38–126)
Anion gap: 11 (ref 5–15)
BUN: 11 mg/dL (ref 8–23)
CO2: 23 mmol/L (ref 22–32)
Calcium: 9.1 mg/dL (ref 8.9–10.3)
Chloride: 101 mmol/L (ref 98–111)
Creatinine, Ser: 0.76 mg/dL (ref 0.61–1.24)
GFR, Estimated: >60 mL/min (ref >60)
Glucose, Bld: 286 mg/dL — ABNORMAL HIGH (ref 70–99)
Potassium: 6.1 mmol/L — ABNORMAL HIGH (ref 3.5–5.1)
Sodium: 135 mmol/L (ref 135–145)
Total Bilirubin: 0.7 mg/dL (ref 0.3–1.2)

## 2021-07-16 LAB — TROPONIN I (HIGH SENSITIVITY)
Troponin I (High Sensitivity): 2174 ng/L (ref <18)
Troponin I (High Sensitivity): 2022 ng/L (ref <18)

## 2021-07-16 LAB — ECHOCARDIOGRAM LIMITED: S' Lateral: 3.2 cm

## 2021-07-16 LAB — HEMOGLOBIN A1C
Hgb A1c MFr Bld: 10.9 % — ABNORMAL HIGH (ref 4.8–5.6)
Mean Plasma Glucose: 266.13 mg/dL

## 2021-07-16 LAB — POCT ACTIVATED CLOTTING TIME
Activated Clotting Time: 342 seconds
Activated Clotting Time: 594 seconds
Activated Clotting Time: 612 seconds
Activated Clotting Time: 293 seconds

## 2021-07-16 LAB — LIPID PANEL
Cholesterol: 249 mg/dL — ABNORMAL HIGH (ref 0–200)
HDL: 44 mg/dL (ref >40)
LDL Cholesterol: 193 mg/dL — ABNORMAL HIGH (ref 0–99)
Total CHOL/HDL Ratio: 5.7 RATIO
Triglycerides: 58 mg/dL (ref <150)
VLDL: 12 mg/dL (ref 0–40)

## 2021-07-16 LAB — LIPASE, BLOOD: Lipase: 21 U/L (ref 11–51)

## 2021-07-16 LAB — BRAIN NATRIURETIC PEPTIDE: B Natriuretic Peptide: 56.4 pg/mL (ref 0.0–100.0)

## 2021-07-16 LAB — D-DIMER, QUANTITATIVE: D-Dimer, Quant: 0.4 ug/mL-FEU (ref 0.00–0.50)

## 2021-07-16 LAB — HEPARIN LEVEL (UNFRACTIONATED): Heparin Unfractionated: >1.1 IU/mL — ABNORMAL HIGH (ref 0.30–0.70)

## 2021-07-16 LAB — MAGNESIUM: Magnesium: 1.8 mg/dL (ref 1.7–2.4)

## 2021-07-16 SURGERY — LEFT HEART CATH AND CORONARY ANGIOGRAPHY
Anesthesia: LOCAL

## 2021-07-16 MED ORDER — LURASIDONE HCL 40 MG PO TABS
40.0000 mg | ORAL_TABLET | Freq: Every day | ORAL | Status: DC
Start: 2021-07-16 — End: 2021-07-19
  Administered 2021-07-16 – 2021-07-18 (×3): 40 mg via ORAL
  Filled 2021-07-16 (×4): qty 1

## 2021-07-16 MED ORDER — GABAPENTIN 300 MG PO CAPS
600.0000 mg | ORAL_CAPSULE | Freq: Every day | ORAL | Status: DC
Start: 2021-07-16 — End: 2021-07-19
  Administered 2021-07-16 – 2021-07-18 (×3): 600 mg via ORAL
  Filled 2021-07-16 (×3): qty 2

## 2021-07-16 MED ORDER — PANCRELIPASE (LIP-PROT-AMYL) 36000-114000 UNITS PO CPEP
36000.0000 [IU] | ORAL_CAPSULE | Freq: Three times a day (TID) | ORAL | Status: DC
  Administered 2021-07-17 – 2021-07-19 (×7): 36000 [IU] via ORAL
  Filled 2021-07-16 (×9): qty 1

## 2021-07-16 MED ORDER — ACETAMINOPHEN 325 MG PO TABS: 650.0000 mg | ORAL_TABLET | Freq: Four times a day (QID) | ORAL | Status: DC | PRN

## 2021-07-16 MED ORDER — DIPHENHYDRAMINE HCL 50 MG/ML IJ SOLN
50.0000 mg | Freq: Once | INTRAMUSCULAR | Status: AC
  Administered 2021-07-16: 50 mg via INTRAVENOUS
  Filled 2021-07-16: qty 1

## 2021-07-16 MED ORDER — INSULIN ASPART 100 UNIT/ML IJ SOLN
0.0000 [IU] | Freq: Three times a day (TID) | INTRAMUSCULAR | Status: DC
  Administered 2021-07-17: 5 [IU] via SUBCUTANEOUS
  Administered 2021-07-17 (×2): 8 [IU] via SUBCUTANEOUS
  Administered 2021-07-18: 2 [IU] via SUBCUTANEOUS
  Administered 2021-07-18: 5 [IU] via SUBCUTANEOUS
  Administered 2021-07-19: 3 [IU] via SUBCUTANEOUS
  Administered 2021-07-19: 5 [IU] via SUBCUTANEOUS

## 2021-07-16 MED ORDER — FUROSEMIDE 20 MG PO TABS
20.0000 mg | ORAL_TABLET | Freq: Every day | ORAL | Status: DC
  Administered 2021-07-17 – 2021-07-19 (×3): 20 mg via ORAL
  Filled 2021-07-16 (×3): qty 1

## 2021-07-16 MED ORDER — SODIUM CHLORIDE 0.9% FLUSH
3.0000 mL | Freq: Two times a day (BID) | INTRAVENOUS | Status: DC
  Administered 2021-07-17: 3 mL via INTRAVENOUS

## 2021-07-16 MED ORDER — INSULIN ASPART 100 UNIT/ML IJ SOLN
4.0000 [IU] | Freq: Three times a day (TID) | INTRAMUSCULAR | Status: DC
  Administered 2021-07-17 – 2021-07-18 (×6): 4 [IU] via SUBCUTANEOUS

## 2021-07-16 MED ORDER — NITROGLYCERIN IN D5W 200-5 MCG/ML-% IV SOLN
0.0000 ug/min | INTRAVENOUS | Status: DC
  Administered 2021-07-16: 5 ug/min via INTRAVENOUS
  Filled 2021-07-16: qty 250

## 2021-07-16 MED ORDER — SODIUM CHLORIDE 0.9% FLUSH: 3.0000 mL | INTRAVENOUS | Status: DC | PRN

## 2021-07-16 MED ORDER — MORPHINE SULFATE (PF) 4 MG/ML IV SOLN
4.0000 mg | Freq: Once | INTRAVENOUS | Status: AC
  Administered 2021-07-16: 4 mg via INTRAVENOUS
  Filled 2021-07-16: qty 1

## 2021-07-16 MED ORDER — INSULIN GLARGINE-YFGN 100 UNIT/ML ~~LOC~~ SOLN
15.0000 [IU] | Freq: Two times a day (BID) | SUBCUTANEOUS | Status: DC
  Filled 2021-07-16 (×3): qty 0.15

## 2021-07-16 MED ORDER — METHYLPREDNISOLONE SODIUM SUCC 125 MG IJ SOLR
100.0000 mg | INTRAMUSCULAR | Status: AC
  Administered 2021-07-16: 100 mg via INTRAVENOUS
  Filled 2021-07-16: qty 2

## 2021-07-16 MED ORDER — TICAGRELOR 90 MG PO TABS
ORAL_TABLET | ORAL | Status: AC
  Filled 2021-07-16: qty 2

## 2021-07-16 MED ORDER — LIDOCAINE HCL (PF) 1 % IJ SOLN
INTRAMUSCULAR | Status: DC | PRN
Start: 2021-07-16 — End: 2021-07-16
  Administered 2021-07-16: 2 mL

## 2021-07-16 MED ORDER — SODIUM CHLORIDE 0.9% FLUSH
3.0000 mL | Freq: Two times a day (BID) | INTRAVENOUS | Status: DC
  Administered 2021-07-16 – 2021-07-17 (×2): 3 mL via INTRAVENOUS

## 2021-07-16 MED ORDER — SODIUM CHLORIDE 0.9 % IV SOLN
INTRAVENOUS | Status: DC | PRN
  Administered 2021-07-16: 20 mL via SURGICAL_CAVITY

## 2021-07-16 MED ORDER — GABAPENTIN 300 MG PO CAPS
300.0000 mg | ORAL_CAPSULE | Freq: Two times a day (BID) | ORAL | Status: DC
  Administered 2021-07-17 – 2021-07-19 (×5): 300 mg via ORAL
  Filled 2021-07-16 (×6): qty 1

## 2021-07-16 MED ORDER — ISOSORBIDE MONONITRATE ER 30 MG PO TB24: 30.0000 mg | ORAL_TABLET | Freq: Every day | ORAL | Status: DC

## 2021-07-16 MED ORDER — INSULIN ASPART 100 UNIT/ML IV SOLN
7.0000 [IU] | Freq: Once | INTRAVENOUS | Status: AC
  Administered 2021-07-16: 7 [IU] via INTRAVENOUS

## 2021-07-16 MED ORDER — SODIUM ZIRCONIUM CYCLOSILICATE 10 G PO PACK
10.0000 g | PACK | Freq: Once | ORAL | Status: DC
  Filled 2021-07-16: qty 1

## 2021-07-16 MED ORDER — SODIUM CHLORIDE 0.9 % IV SOLN: INTRAVENOUS | Status: AC

## 2021-07-16 MED ORDER — ONDANSETRON HCL 4 MG/2ML IJ SOLN: 4.0000 mg | Freq: Four times a day (QID) | INTRAMUSCULAR | Status: DC | PRN

## 2021-07-16 MED ORDER — HEPARIN SODIUM (PORCINE) 1000 UNIT/ML IJ SOLN
INTRAMUSCULAR | Status: AC
  Filled 2021-07-16: qty 10

## 2021-07-16 MED ORDER — IOHEXOL 350 MG/ML SOLN
INTRAVENOUS | Status: DC | PRN
  Administered 2021-07-16: 200 mL

## 2021-07-16 MED ORDER — ASPIRIN 81 MG PO CHEW: 81.0000 mg | CHEWABLE_TABLET | Freq: Every day | ORAL | Status: DC

## 2021-07-16 MED ORDER — NITROGLYCERIN 0.4 MG SL SUBL: 0.4000 mg | SUBLINGUAL_TABLET | SUBLINGUAL | Status: DC | PRN

## 2021-07-16 MED ORDER — INSULIN ASPART 100 UNIT/ML IJ SOLN
0.0000 [IU] | Freq: Every day | INTRAMUSCULAR | Status: DC
  Administered 2021-07-16: 5 [IU] via SUBCUTANEOUS
  Administered 2021-07-17: 2 [IU] via SUBCUTANEOUS

## 2021-07-16 MED ORDER — VERAPAMIL HCL 2.5 MG/ML IV SOLN
INTRAVENOUS | Status: AC
  Filled 2021-07-16: qty 2

## 2021-07-16 MED ORDER — DEXTROSE 50 % IV SOLN
1.0000 | Freq: Once | INTRAVENOUS | Status: AC
  Administered 2021-07-16: 50 mL via INTRAVENOUS
  Filled 2021-07-16: qty 50

## 2021-07-16 MED ORDER — HEPARIN (PORCINE) IN NACL 1000-0.9 UT/500ML-% IV SOLN
INTRAVENOUS | Status: DC | PRN
  Administered 2021-07-16 (×2): 500 mL

## 2021-07-16 MED ORDER — PANTOPRAZOLE SODIUM 40 MG PO TBEC
40.0000 mg | DELAYED_RELEASE_TABLET | Freq: Every day | ORAL | Status: DC
  Administered 2021-07-16 – 2021-07-19 (×4): 40 mg via ORAL
  Filled 2021-07-16 (×4): qty 1

## 2021-07-16 MED ORDER — HYDROMORPHONE HCL 1 MG/ML IJ SOLN
1.0000 mg | INTRAMUSCULAR | Status: AC
  Administered 2021-07-16: 1 mg via INTRAVENOUS
  Filled 2021-07-16: qty 1

## 2021-07-16 MED ORDER — SERTRALINE HCL 100 MG PO TABS
100.0000 mg | ORAL_TABLET | Freq: Every day | ORAL | Status: DC
  Administered 2021-07-17 – 2021-07-19 (×3): 100 mg via ORAL
  Filled 2021-07-16 (×3): qty 1

## 2021-07-16 MED ORDER — INSULIN GLARGINE-YFGN 100 UNIT/ML ~~LOC~~ SOLN
25.0000 [IU] | Freq: Two times a day (BID) | SUBCUTANEOUS | Status: DC
  Filled 2021-07-16 (×2): qty 0.25

## 2021-07-16 MED ORDER — POLYETHYLENE GLYCOL 3350 17 G PO PACK: 17.0000 g | PACK | Freq: Every day | ORAL | Status: DC | PRN

## 2021-07-16 MED ORDER — HEPARIN BOLUS VIA INFUSION
4000.0000 [IU] | Freq: Once | INTRAVENOUS | Status: AC
  Administered 2021-07-16: 4000 [IU] via INTRAVENOUS
  Filled 2021-07-16: qty 4000

## 2021-07-16 MED ORDER — ONDANSETRON HCL 4 MG/2ML IJ SOLN
4.0000 mg | Freq: Once | INTRAMUSCULAR | Status: AC
  Administered 2021-07-16: 4 mg via INTRAVENOUS
  Filled 2021-07-16: qty 2

## 2021-07-16 MED ORDER — INSULIN ASPART 100 UNIT/ML IJ SOLN
10.0000 [IU] | Freq: Once | INTRAMUSCULAR | Status: AC
  Administered 2021-07-16: 10 [IU] via SUBCUTANEOUS

## 2021-07-16 MED ORDER — SODIUM CHLORIDE 0.9 % IV SOLN: 250.0000 mL | INTRAVENOUS | Status: DC | PRN

## 2021-07-16 MED ORDER — HEPARIN SODIUM (PORCINE) 1000 UNIT/ML IJ SOLN
INTRAMUSCULAR | Status: DC | PRN
Start: 2021-07-16 — End: 2021-07-16
  Administered 2021-07-16: 5000 [IU] via INTRAVENOUS
  Administered 2021-07-16: 6000 [IU] via INTRAVENOUS
  Administered 2021-07-16: 1000 [IU] via INTRAVENOUS

## 2021-07-16 MED ORDER — INSULIN GLARGINE-YFGN 100 UNIT/ML ~~LOC~~ SOLN
15.0000 [IU] | Freq: Two times a day (BID) | SUBCUTANEOUS | Status: DC
  Administered 2021-07-16: 15 [IU] via SUBCUTANEOUS
  Filled 2021-07-16 (×2): qty 0.15

## 2021-07-16 MED ORDER — METOPROLOL TARTRATE 5 MG/5ML IV SOLN
5.0000 mg | INTRAVENOUS | Status: DC
  Filled 2021-07-16: qty 5

## 2021-07-16 MED ORDER — TICAGRELOR 90 MG PO TABS
ORAL_TABLET | ORAL | Status: DC | PRN
Start: 2021-07-16 — End: 2021-07-16
  Administered 2021-07-16: 180 mg via ORAL

## 2021-07-16 MED ORDER — LABETALOL HCL 5 MG/ML IV SOLN: 10.0000 mg | INTRAVENOUS | Status: AC | PRN

## 2021-07-16 MED ORDER — CARVEDILOL 12.5 MG PO TABS
12.5000 mg | ORAL_TABLET | Freq: Two times a day (BID) | ORAL | Status: DC
  Administered 2021-07-16 – 2021-07-19 (×6): 12.5 mg via ORAL
  Filled 2021-07-16 (×6): qty 1

## 2021-07-16 MED ORDER — BUPROPION HCL ER (SR) 150 MG PO TB12
150.0000 mg | ORAL_TABLET | Freq: Two times a day (BID) | ORAL | Status: DC
Start: 2021-07-16 — End: 2021-07-19
  Administered 2021-07-16 – 2021-07-19 (×6): 150 mg via ORAL
  Filled 2021-07-16 (×7): qty 1

## 2021-07-16 MED ORDER — CLOPIDOGREL BISULFATE 75 MG PO TABS: 75.0000 mg | ORAL_TABLET | Freq: Every day | ORAL | Status: DC

## 2021-07-16 MED ORDER — ACETAMINOPHEN 325 MG PO TABS: 650.0000 mg | ORAL_TABLET | ORAL | Status: DC | PRN

## 2021-07-16 MED ORDER — HYDRALAZINE HCL 20 MG/ML IJ SOLN: 10.0000 mg | INTRAMUSCULAR | Status: AC | PRN

## 2021-07-16 MED ORDER — TICAGRELOR 90 MG PO TABS
90.0000 mg | ORAL_TABLET | Freq: Two times a day (BID) | ORAL | Status: DC
  Administered 2021-07-17 – 2021-07-19 (×5): 90 mg via ORAL
  Filled 2021-07-16 (×5): qty 1

## 2021-07-16 MED ORDER — VERAPAMIL HCL 2.5 MG/ML IV SOLN
INTRAVENOUS | Status: DC | PRN
  Administered 2021-07-16: 10 mL via INTRA_ARTERIAL

## 2021-07-16 MED ORDER — ATORVASTATIN CALCIUM 80 MG PO TABS
80.0000 mg | ORAL_TABLET | Freq: Every day | ORAL | Status: DC
Start: 2021-07-16 — End: 2021-07-19
  Administered 2021-07-16 – 2021-07-18 (×3): 80 mg via ORAL
  Filled 2021-07-16 (×3): qty 1

## 2021-07-16 MED ORDER — ASPIRIN 81 MG PO CHEW
81.0000 mg | CHEWABLE_TABLET | Freq: Every day | ORAL | Status: DC
  Administered 2021-07-17 – 2021-07-19 (×3): 81 mg via ORAL
  Filled 2021-07-16 (×3): qty 1

## 2021-07-16 MED ORDER — NICOTINE 21 MG/24HR TD PT24
21.0000 mg | MEDICATED_PATCH | Freq: Every day | TRANSDERMAL | Status: DC
  Administered 2021-07-16: 21 mg via TRANSDERMAL
  Filled 2021-07-16: qty 1

## 2021-07-16 MED ORDER — ACETAMINOPHEN 650 MG RE SUPP: 650.0000 mg | Freq: Four times a day (QID) | RECTAL | Status: DC | PRN

## 2021-07-16 MED ORDER — HEPARIN (PORCINE) IN NACL 1000-0.9 UT/500ML-% IV SOLN
INTRAVENOUS | Status: AC
  Filled 2021-07-16: qty 1000

## 2021-07-16 MED ORDER — LIDOCAINE HCL (PF) 1 % IJ SOLN
INTRAMUSCULAR | Status: AC
  Filled 2021-07-16: qty 30

## 2021-07-16 MED ORDER — HEPARIN (PORCINE) 25000 UT/250ML-% IV SOLN
1200.0000 [IU]/h | INTRAVENOUS | Status: DC
  Administered 2021-07-16: 1200 [IU]/h via INTRAVENOUS
  Filled 2021-07-16: qty 250

## 2021-07-16 SURGICAL SUPPLY — 35 items
BALL SAPPHIRE NC24 2.50X12 (BALLOONS) ×2
BALL SAPPHIRE NC24 3.0X15 (BALLOONS) ×2
BALLN SAPPHIRE 2.5X12 (BALLOONS) ×2
BALLN SAPPHIRE 2.5X20 (BALLOONS) ×2
BALLN WOLVERINE 2.50X15 (BALLOONS) ×2
BALLOON SAPPHIRE 2.5X12 (BALLOONS) IMPLANT
BALLOON SAPPHIRE 2.5X20 (BALLOONS) IMPLANT
BALLOON SAPPHIRE NC24 2.50X12 (BALLOONS) IMPLANT
BALLOON SAPPHIRE NC24 3.0X15 (BALLOONS) IMPLANT
BALLOON WOLVERINE 2.50X15 (BALLOONS) IMPLANT
BAND ZEPHYR COMPRESS 30 LONG (HEMOSTASIS) ×1 IMPLANT
CATH DIAG 6FR JR4 (CATHETERS) ×1 IMPLANT
CATH INFINITI 6F FL3.5 (CATHETERS) ×1 IMPLANT
CATH LAUNCHER 5F RADL (CATHETERS) IMPLANT
CATH LAUNCHER 6FR EBU 3 (CATHETERS) ×1 IMPLANT
CATH LAUNCHER 6FR JR4 (CATHETERS) ×1 IMPLANT
CATH SWAN GANZ 7F STRAIGHT (CATHETERS) ×1 IMPLANT
CATH TELEPORT (CATHETERS) ×1 IMPLANT
CATHETER LAUNCHER 5F RADL (CATHETERS) ×2
CROWN DIAMONDBACK CLASSIC 1.25 (BURR) ×1 IMPLANT
GLIDESHEATH SLEND SS 6F .021 (SHEATH) ×1 IMPLANT
KIT ENCORE 26 ADVANTAGE (KITS) ×1 IMPLANT
KIT HEART LEFT (KITS) ×2 IMPLANT
LUBRICANT VIPERSLIDE CORONARY (MISCELLANEOUS) ×1 IMPLANT
PACK CARDIAC CATHETERIZATION (CUSTOM PROCEDURE TRAY) ×2 IMPLANT
SHEATH 6FR 75 DEST SLENDER (SHEATH) ×1 IMPLANT
SHEATH PINNACLE 6F 10CM (SHEATH) ×1 IMPLANT
STENT SYNERGY XD 2.50X48 (Permanent Stent) IMPLANT
SYNERGY XD 2.50X48 (Permanent Stent) ×2 IMPLANT
SYR CONTROL 10ML ANGIOGRAPHIC (SYRINGE) ×1 IMPLANT
TRANSDUCER W/STOPCOCK (MISCELLANEOUS) ×2 IMPLANT
TUBING CIL FLEX 10 FLL-RA (TUBING) ×2 IMPLANT
WIRE ASAHI PROWATER 300CM (WIRE) ×1 IMPLANT
WIRE EMERALD 3MM-J .035X260CM (WIRE) ×1 IMPLANT
WIRE VIPERWIRE COR FLEX .012 (WIRE) ×1 IMPLANT

## 2021-07-16 NOTE — Hospital Course (Signed)
Previously on 3L supplemental O2 but was recently discontinued  Primary care, podiatry, anxiety/depression, PTSD, endocrinology   Not taking metformin

## 2021-07-16 NOTE — Progress Notes (Signed)
CBG 497, Pt is post PCI this evening and ate approx 1 hr prior to sugar being checked. Pt scheduled to receive Semglee 15 units and Novolog 5 units per [facility] orders. Provider on call notified via amion to see if they wanted to adjust insulin prior to giving. Jessie Foot, RN

## 2021-07-16 NOTE — Progress Notes (Signed)
ANTICOAGULATION CONSULT NOTE - Initial Consult  Pharmacy Consult for heparin Indication: chest pain/ACS  Allergies  Allergen Reactions   Iohexol Hives    Patient broke out in hives after injection of Omni 300, will need 13 hour pre-med in future   Semaglutide Other (See Comments)    Pancreatitis Ozempic   Lac Bovis Diarrhea    Finding of gastrointestinal tract gas   Lisinopril Cough   Simvastatin Itching and Cough    Patient Measurements:   Heparin Dosing Weight: 93kg  Vital Signs: Temp: 98.3 F (36.8 C) (05/28 1030) Temp Source: Oral (05/28 1030) BP: 152/108 (05/28 1230) Pulse Rate: 111 (05/28 1230)  Labs: Recent Labs    07/16/21 1037  HGB 16.2  HCT 49.6  PLT 166  CREATININE 0.76  TROPONINIHS 2,174*    CrCl cannot be calculated (Unknown ideal weight.).   Medical History: Past Medical History:  Diagnosis Date   Alcohol abuse 06/14/2019   Diabetes mellitus without complication (HCC)    Essential hypertension 06/14/2019   Mixed hyperlipidemia due to type 2 diabetes mellitus (HCC) 07/31/2019   Nicotine dependence, cigarettes, uncomplicated 07/31/2019   Pancreatitis    Uncontrolled type 2 diabetes mellitus with diabetic polyneuropathy, with long-term current use of insulin 07/31/2019    Assessment: 61 YOM presenting with CP and SOB, elevated troponin, he is not on anticoagulation PTA, CBC wnl  Goal of Therapy:  Heparin level 0.3-0.7 units/ml Monitor platelets by anticoagulation protocol: Yes   Plan:  Heparin 4000 units IV x 1, and gtt at 1200 units/hr F/u 6 hour heparin level  Daylene Posey, PharmD Clinical Pharmacist ED Pharmacist Phone # [phone] 07/16/2021 1:18 PM

## 2021-07-16 NOTE — ED Provider Notes (Signed)
[facility] EMERGENCY DEPARTMENT Provider Note   CSN: [id number] Arrival date & time: 07/16/21  1020     History  Chief Complaint  Patient presents with   Chest Pain    Ricardo Howard is a 61 y.o. male.  Presents chief complaint of chest pain.  States it started last night describes it as aching sharp pain in the mid chest nonradiating.  He thinks he might have been acid reflux but the pain is persisted till this morning, made worse when he takes a deep breath.  Denies any fevers or cough or vomiting or diarrhea.  He took 324 mg of aspirin per EMS and was given 2 nitros per EMS prior to arrival.      Home Medications Prior to Admission medications   Medication Sig Start Date End Date Taking? Authorizing Provider  acetaminophen (TYLENOL) 500 MG tablet Take 1,000 mg by mouth 2 (two) times daily as needed for mild pain.    [provider]  aspirin 81 MG tablet Take 81 mg by mouth daily.    [provider]  atorvastatin (LIPITOR) 80 MG tablet Take 80 mg by mouth at bedtime.    [provider]  buPROPion (WELLBUTRIN SR) 150 MG 12 hr tablet Take 150 mg by mouth in the morning and at bedtime.    [provider]  carvedilol (COREG) 25 MG tablet Take 12.5 mg by mouth 2 (two) times daily with a meal. 01/20/20   [provider]  clopidogrel (PLAVIX) 75 MG tablet Take 1 tablet (75 mg total) by mouth daily. 12/02/20 12/02/21  Leonie Douglas, MD  empagliflozin (JARDIANCE) 25 MG TABS tablet Take 12.5 mg by mouth daily.    [provider]  folic acid (FOLVITE) 1 MG tablet Take 1 tablet (1 mg total) by mouth daily. 10/11/19   Rodolph Bong, MD  furosemide (LASIX) 20 MG tablet Take 10 mg by mouth daily. 10/10/20   [provider]  gabapentin (NEURONTIN) 300 MG capsule Take 300-600 mg by mouth See admin instructions. Take 300 mg morning and noon. Take 600 mg at bedtime    [provider]  insulin aspart  (NOVOLOG) 100 UNIT/ML FlexPen Inject 4 Units into the skin 3 (three) times daily with meals. Patient taking differently: Inject 20 Units into the skin in the morning, at noon, and at bedtime. 10/10/19   Rodolph Bong, MD  insulin glargine (LANTUS) 100 UNIT/ML Solostar Pen 30units in am and 35unit at night  Further adjustment per your pcp and endocrinology Goal of a1c is less than 7%, currently your a1c is 10%, not at goal 06/03/21   Albertine Grates, MD  Insulin Pen Needle (NOVOFINE) 30G X 8 MM MISC Inject 10 each into the skin as needed. 10/10/19   Rodolph Bong, MD  isosorbide mononitrate (IMDUR) 30 MG 24 hr tablet Take 30 mg by mouth daily. 01/20/20   [provider]  lipase/protease/amylase (CREON) 36000 UNITS CPEP capsule Take 1 capsule (36,000 Units total) by mouth 3 (three) times daily before meals. 06/17/19   Laverna Peace, MD  lurasidone (LATUDA) 20 MG TABS tablet Take 40 mg by mouth every evening.    [provider]  magnesium oxide (MAG-OX) 400 (241.3 Mg) MG tablet Take 1 tablet (400 mg total) by mouth 2 (two) times daily. Patient taking differently: Take 400 mg by mouth daily. 03/18/20   Burnadette Pop, MD  mesalamine (LIALDA) 1.2 g EC tablet Take 1.2 g by  mouth daily. 11/10/20   [provider]  metFORMIN (GLUMETZA) 500 MG (MOD) 24 hr tablet Take 500 mg by mouth in the morning and at bedtime.    [provider]  naloxone [facility]) nasal spray 4 mg/0.1 mL Place 1 spray into the nose as needed (opioid reversal).    [provider]  naproxen sodium (ALEVE) 220 MG tablet Take 440 mg by mouth daily as needed (headache).    [provider]  pantoprazole (PROTONIX) 40 MG tablet Take 40 mg by mouth daily. 10/10/20   [provider]  polyethylene glycol (MIRALAX / GLYCOLAX) 17 g packet Take 17 g by mouth daily as needed for mild constipation. 03/18/20   Burnadette Pop, MD  psyllium (METAMUCIL) 58.6 % packet Take 1 packet by mouth daily.     [provider]  sertraline (ZOLOFT) 100 MG tablet Take 100 mg by mouth daily.    [provider]  tamsulosin (FLOMAX) 0.4 MG CAPS capsule Take 0.4 mg by mouth daily. 01/20/20   [provider]  thiamine 100 MG tablet Take 1 tablet (100 mg total) by mouth daily. 10/11/19   Rodolph Bong, MD  vitamin B-12 (CYANOCOBALAMIN) 500 MCG tablet Take 500 mcg by mouth daily. 01/20/20   [provider]      Allergies    Iohexol, Semaglutide, Lac bovis, Lisinopril, and Simvastatin    Review of Systems   Review of Systems  Constitutional:  Negative for fever.  HENT:  Negative for ear pain and sore throat.   Eyes:  Negative for pain.  Respiratory:  Positive for shortness of breath. Negative for cough.   Cardiovascular:  Positive for chest pain.  Gastrointestinal:  Negative for abdominal pain.  Genitourinary:  Negative for flank pain.  Musculoskeletal:  Negative for back pain.  Skin:  Negative for color change and rash.  Neurological:  Negative for syncope.  All other systems reviewed and are negative.  Physical Exam Updated Vital Signs BP (!) 156/106   Pulse (!) 114   Temp 98.3 F (36.8 C) (Oral)   Resp (!) 23   SpO2 94%  Physical Exam Constitutional:      Appearance: He is well-developed.  HENT:     Head: Normocephalic.     Nose: Nose normal.  Eyes:     Extraocular Movements: Extraocular movements intact.  Cardiovascular:     Rate and Rhythm: Normal rate.  Pulmonary:     Effort: Pulmonary effort is normal.  Musculoskeletal:     Right lower leg: No tenderness. No edema.     Left lower leg: No tenderness. No edema.  Skin:    Coloration: Skin is not jaundiced.  Neurological:     Mental Status: He is alert. Mental status is at baseline.    ED Results / Procedures / Treatments   Labs (all labs ordered are listed, but only abnormal results are displayed) Labs Reviewed  CBC WITH DIFFERENTIAL/PLATELET - Abnormal; Notable for the following  components:      Result Value   RDW 20.6 (*)    All other components within normal limits  COMPREHENSIVE METABOLIC PANEL - Abnormal; Notable for the following components:   Potassium 6.1 (*)    Glucose, Bld 286 (*)    AST 79 (*)    All other components within normal limits  TROPONIN I (HIGH SENSITIVITY) - Abnormal; Notable for the following components:   Troponin I (High Sensitivity) 2,174 (*)    All other components within normal limits  BRAIN NATRIURETIC PEPTIDE  LIPASE, BLOOD  D-DIMER, QUANTITATIVE  HEPARIN LEVEL (UNFRACTIONATED)  I-STAT VENOUS BLOOD GAS, ED  TROPONIN I (HIGH SENSITIVITY)    EKG EKG Interpretation  Date/Time:  Sunday Jul 16 2021 10:31:31 EDT Ventricular Rate:  109 PR Interval:  121 QRS Duration: 90 QT Interval:  334 QTC Calculation: 450 R Axis:   105 Text Interpretation: Sinus tachycardia Right axis deviation Confirmed by Norman ClayHong, Zenovia Justman ([id number]) on 07/16/2021 10:35:51 AM  Radiology DG Chest Port 1 View  Result Date: 07/16/2021 CLINICAL DATA:  Chest pain. EXAM: PORTABLE CHEST 1 VIEW COMPARISON:  05/30/2021 chest radiograph, 05/31/2021 CT and prior studies FINDINGS: The cardiomediastinal silhouette is unchanged. Mild pulmonary vascular congestion is noted. Continued RIGHT LOWER lung opacity and mild LEFT basilar atelectasis again noted. No pneumothorax or large pleural effusion identified. No acute bony abnormalities present IMPRESSION: 1. Unchanged RIGHT LOWER lung opacity which may represent atelectasis or airspace disease/pneumonia. 2. Unchanged mild LEFT basilar atelectasis. 3. Mild pulmonary vascular congestion. Electronically Signed   By: Harmon PierJeffrey  Hu M.D.   On: 07/16/2021 11:22    Procedures .Critical Care Performed by: Cheryll CockayneHong, Bartt Gonzaga S, MD Authorized by: Cheryll CockayneHong, Eileen Kangas S, MD   Critical care provider statement:    Critical care time (minutes):  40   Critical care time was exclusive of:  Separately billable procedures and treating other patients and teaching  time   Critical care was necessary to treat or prevent imminent or life-threatening deterioration of the following conditions:  Cardiac failure    Medications Ordered in ED Medications  nitroGLYCERIN (NITROSTAT) SL tablet 0.4 mg (has no administration in time range)  heparin bolus via infusion 4,000 Units (has no administration in time range)  heparin ADULT infusion 100 units/mL (25000 units/25050mL) (1,200 Units/hr Intravenous New Bag/Given 07/16/21 1342)  nitroGLYCERIN 50 mg in dextrose 5 % 250 mL (0.2 mg/mL) infusion (has no administration in time range)  morphine (PF) 4 MG/ML injection 4 mg (4 mg Intravenous Given 07/16/21 1127)  ondansetron (ZOFRAN) injection 4 mg (4 mg Intravenous Given 07/16/21 1127)  dextrose 50 % solution 50 mL (50 mLs Intravenous Given 07/16/21 1338)  insulin aspart (novoLOG) injection 7 Units (7 Units Intravenous Given 07/16/21 1338)    ED Course/ Medical Decision Making/ A&P Clinical Course as of 07/16/21 1342  Sun Jul 16, 2021  1315 Potassium(!): 6.1 [JH]    Clinical Course User Index [JH] Audley HoseHong, Eustace MooreJoshua S, MD                           Medical Decision Making Amount and/or Complexity of Data Reviewed Labs: ordered. Decision-making details documented in ED Course. Radiology: ordered.  Risk OTC drugs. Prescription drug management.   Cardiac monitor showing sinus rhythm.  Review of record shows outpatient office visit for diabetes Jul 11, 2021.  EKG shows sinus rhythm tachycardic rate.  No ST elevations or depressions noted.  Diagnosis studies included labs White count normal hemoglobin normal D-dimer normal.  Hemoglobin is 6.1 however creatinine appears normal.  EKG shows no ST elevation or peaked T waves or QRS widening.  Blood gas ordered to verify potassium levels.  Patient started on D50 and IV insulin.  Troponin has returned elevated greater than 2000.  Patient has ongoing chest pain despite morphine glycerin on route.  Given additional  nitroglycerin and started nitroglycerin drip.  Blood pressure remained stable at 150 systolic.  Case discussed with cardiology who will come and see the patient discussed with  Dr.Christopher.  Recommending medical admission.  Medicine consulted for admission.        Final Clinical Impression(s) / ED Diagnoses Final diagnoses:  NSTEMI (non-ST elevated myocardial infarction) [facility])    Rx / DC Orders ED Discharge Orders     None         Cheryll Cockayne, MD 07/16/21 1342

## 2021-07-16 NOTE — H&P (Addendum)
Date: 07/16/2021               Patient Name:  Ricardo Howard MRN: [id number]  DOB: Sep 01, 1960 Age / Sex: 61 y.o., male   PCP: [facility] [place] Service: Internal Medicine Teaching Service         Attending Physician: Dr. Dareen Piano     First Contact: Sabino Dick  Pager: [phone]  Second Contact: Dr. Linwood Dibbles Pager: [phone]       After Hours (After 5p/  First Contact Pager: [phone]  weekends / holidays): Second Contact Pager: [phone]   Chief Complaint: Chest pain  History of Present Illness:  Ricardo Howard is a 61 year old male with past medical history of uncontrolled T2DM,  CHF (LVEF 40-45%), HTN, HLD, severe OSA, PAD s/p R BKA in 02/2020 and multiple LLE stents, alcohol use disorder, recurrent pancreatitis, and recent hospitalization for sepsis 2/2 CAP and DKA who is presenting with acute chest pain, admitted for NSTEMI.   Patient notes that he has had several days of intermittent chest pain that he attributed to acid reflux. He states he began experiencing a different, more severe chest pain last night around 8:30-9 PM. The pain is a constant pressure at the front of his chest that radiates to his left shoulder. He woke up around 5 AM with worsening pain and called EMS. EMS assessed him and gave him the option to remain home, which he did. The pain continued to worsen and was accompanied by sweating, nausea, and SOB, so he called EMS again around 10 AM and was taken to the ED. He notes the pain is worse when he takes a deep breath in, and he had mild relief earlier this morning after receiving morphine, but currently chest pain is 9/10. His wife reports he was on 3L of supplemental O2 at home but this was discontinued 4-5 months ago.   He denies vomiting, but notes that appetite is decreased. Had a small BM this AM after receiving Senna but last regular BM was Friday, he typically has a BM daily. Was hospitalized at [facility] from 4/11-4/15 for DKA and  CAP, treated with Rocephin and Zithromax. PCP is Dr. Jenean Lindau at the [facility].   Received ASA 325 mg and nitro x2 in the field without significant relief, per EMS was satting 86% on RA so was placed on 2L of O2. In the ED, he was noted to be hypertensive to 164/106, tachycardic to 124, tachypneic with RR 21-27. Labs notable for unremarkable CBC w/ diff, CMP with K of 6.1, glucose 286, AST of 76, and ALT wnl, and lipase wnl. Troponin elevated to 2174, 2022. EKG with sinus tachycardia with RAD. Cardiology was consulted. He received morphine and was started on a nitro drip for chest pain, IV heparin 4000 units for NSTEMI, and Novolog 7U + solumedrol for hyperkalemia.   Meds:  Current Meds  Medication Sig   acetaminophen (TYLENOL) 500 MG tablet Take 1,000 mg by mouth 2 (two) times daily as needed for mild pain.   aspirin 81 MG tablet Take 81 mg by mouth daily.   atorvastatin (LIPITOR) 80 MG tablet Take 80 mg by mouth at bedtime.   buPROPion (WELLBUTRIN SR) 150 MG 12 hr tablet Take 150 mg by mouth in the morning and at bedtime.   carvedilol (COREG) 25 MG tablet Take 12.5 mg by mouth 2 (two) times daily with a meal.   clopidogrel (  PLAVIX) 75 MG tablet Take 1 tablet (75 mg total) by mouth daily.   empagliflozin (JARDIANCE) 25 MG TABS tablet Take 12.5 mg by mouth daily.   folic acid (FOLVITE) 1 MG tablet Take 1 tablet (1 mg total) by mouth daily.   furosemide (LASIX) 20 MG tablet Take 10 mg by mouth daily.   gabapentin (NEURONTIN) 300 MG capsule Take 300-600 mg by mouth See admin instructions. Take 300 mg morning and noon. Take 600 mg at bedtime   insulin aspart (NOVOLOG) 100 UNIT/ML FlexPen Inject 4 Units into the skin 3 (three) times daily with meals. (Patient taking differently: Inject 20 Units into the skin in the morning, at noon, and at bedtime.)   insulin glargine (LANTUS) 100 UNIT/ML Solostar Pen 30units in am and 35unit at night  Further adjustment per your pcp and endocrinology Goal of a1c is less  than 7%, currently your a1c is 10%, not at goal (Patient taking differently: 30-35 Units See admin instructions. 30units in am and 35unit at night  Further adjustment per your pcp and endocrinology Goal of a1c is less than 7%, currently your a1c is 10%, not at goal)   isosorbide mononitrate (IMDUR) 30 MG 24 hr tablet Take 30 mg by mouth daily.   lipase/protease/amylase (CREON) 36000 UNITS CPEP capsule Take 1 capsule (36,000 Units total) by mouth 3 (three) times daily before meals.   lurasidone (LATUDA) 40 MG TABS tablet Take 40 mg by mouth every evening.   magnesium oxide (MAG-OX) 400 (241.3 Mg) MG tablet Take 1 tablet (400 mg total) by mouth 2 (two) times daily. (Patient taking differently: Take 400 mg by mouth daily.)   mesalamine (LIALDA) 1.2 g EC tablet Take 1.2 g by mouth daily.   naloxone (NARCAN) nasal spray 4 mg/0.1 mL Place 1 spray into the nose as needed (opioid reversal).   naproxen sodium (ALEVE) 220 MG tablet Take 440 mg by mouth daily as needed (headache).   pantoprazole (PROTONIX) 40 MG tablet Take 40 mg by mouth daily.   polyethylene glycol (MIRALAX / GLYCOLAX) 17 g packet Take 17 g by mouth daily as needed for mild constipation.   sertraline (ZOLOFT) 100 MG tablet Take 100 mg by mouth daily.   tamsulosin (FLOMAX) 0.4 MG CAPS capsule Take 0.4 mg by mouth daily.   thiamine 100 MG tablet Take 1 tablet (100 mg total) by mouth daily.   vitamin B-12 (CYANOCOBALAMIN) 500 MCG tablet Take 500 mcg by mouth daily.    Allergies: Allergies as of 07/16/2021 - Review Complete 07/16/2021  Allergen Reaction Noted   Iohexol Hives 06/14/2019   Semaglutide Other (See Comments) 09/01/2020   Shellfish allergy Hives 07/16/2021   Lac bovis Diarrhea 11/08/2011   Lisinopril Cough 05/16/2011   Simvastatin Itching and Cough 08/04/2013   Past Medical History:  Diagnosis Date   Alcohol abuse 06/14/2019   Anxiety    Back pain    Cataracts, bilateral    CHF (congestive heart failure) (HCC)     Depression    Diabetic retinopathy ([place])    Essential hypertension 06/14/2019   Hepatitis C    cured   Hx of BKA, right ([place])    Hyperlipidemia    Hypertension    Mixed hyperlipidemia due to type 2 diabetes mellitus ([place]) 07/31/2019   Nicotine dependence, cigarettes, uncomplicated [id number]   PAD (peripheral artery disease) (HCC)    Pancreatitis    PTSD (post-traumatic stress disorder)    Uncontrolled type 2 diabetes mellitus with diabetic polyneuropathy, with long-term current  use of insulin 07/31/2019   Surgical History:  - Left superficial femoral / above knee popliteal artery stenting  - Left external iliac artery stenting  - Right BKA  - Cardiac stent in RCA ~2013  Family History:  2 brothers with hx of MI; 1 brother passed away from [place] last June 03, 2022 at age 69, other brother with MI at age 77 Father: Lung cancer, T2DM Mother: Died from kidney failure   Social History:  Lives with wife in [place] that live in [place] and [place] Five grand-children, youngest 40 months old  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 No obvious trigger to drinking again, says he "just wanted a buzz." Denies using other substances for several years   Review of Systems: A complete ROS was negative except as per HPI.  Physical Exam: Blood pressure 115/84, pulse (!) 117, temperature 98.3 F (36.8 C), temperature source Oral, resp. rate (!) 24, SpO2 97 %. General: Uncomfortable appearing male, groaning from pain, appears distressed  HEENT: Normocephalic, atraumatic, anicteric sclerae  CV: Slightly elevated rate, no murmurs heard Pulmonary: Lungs clear anteriorly, slightly  increased WOB on 3L of O2  Abdominal: +BS, mildly distended, tenderness to palpation of epigastric region  Extremities: Right leg BKA with well-healed incision, LLE with trace edema and healing scabs present on anterior shin  Skin: Warm, diaphoretic  Neuro: A&Ox3 Psych: Appropriate behavior and affect  EKG: personally reviewed my interpretation is: Sinus tachycardia, RAD, no t-waves inversion, ST elevation, or ST depression seen  CXR: personally reviewed my interpretation is: Increased opacity of RLL, mild pulmonary vascular congestion   Assessment & Plan by Problem: Principal Problem:   NSTEMI (non-ST elevated myocardial infarction) ([place]) Active Problems:   Alcohol abuse   Diabetes mellitus with diabetic neuropathy, with long-term current use of insulin (HCC)   Hyperkalemia  #N-STEMI  #CHF Patient with multiple risk factors including HTN, T2DM, CAD, and strong +FMHx presenting with acute chest pain, SOB, elevated troponins, and no ST changes on EKG, consistent with NSTEMI. BNP wnl at 56.2. Recent Echo from 06/03/2022 with LVEF 40-45% and grade I diastolic dysfunction, Echo today with stable EF, septal hypokinesis mid/basal wall akinesis, and mildly dilated LV internal cavity. Received IV heparin 4000 unit bolus and 1200 units/hr drip in ED. Seen by Dr. Johnsie Cancel from Cardiology with concern for MI unidentified by EKG, planning for urgent cardiac catherization. Gave additional dose of Dilaudid 1 mg for persistent chest pain. Takes Lasix 20 mg daily.  - Cardiology consulted, appreciate assistance  - Tylenol 650 mg PRN pain - Restart Lasix 20 mg and imdur 30 daily after cath   #Hyperkalemia  K elevated to 6.1 on arrival, rechecked due to suspicion for hemolysis as patient has normal Cr, K further elevated to 6.8. Now s/p Novolog 7U and solumedrol. Rechecking BMP this evening and adding Mg. Etiology of hyperkalemia unclear but may be secondary to tissue damage and intracellular release from NSTEMI.   - f/u BMP, Mg  - Repeat BMP tomorrow AM   #T2DM  #Neuropathy  Poorly controlled, HgbA1c 10.9. Reports current home regimen is Jardiance 12.5 mg daily, Novolog 30U BID, Lantus 35U BID, and Novolog 20U +  SSI TID with meals. Diabetes coordinator note states that current regimen should be Lantus 30U AM/ 35U PM and Novolog 20U TID with meals. Not currently taking Metformin due to diarrhea. Also takes Gabapentin 300 mg at 9 AM and 12 PM, and 600 mg qhs.  - Start Semglee 15U BID + Novolog SSI  - CBG q4h  - Restart Gabapentin after catheterization    #Alcohol abuse #Chronic pancreatitis  Reports drinking 12-pack of beer daily for the past 3 months. Denies severe withdrawal or history of withdrawal seizures. Lipase wnl. Takes pancreatic enzyme replacement therapy Creon capsule TID before meals. CT abdomen from 4/12 with multiple calcifications of pancreatic head.  - Restart Creon 36000 unit capsule TID before meals after cath  - CIWA w/out Ativan   #HTN Not currently on ACE or ARB, home meds are Coreg 12.5 mg BID and Imdur 30 mg daily. Prior hospitalizations for hypotension.  - Restart Coreg and Imdur after cath  #HLD Lipid panel with LDL 193, home med is atorvastatin 80 mg daily. Consider Zetia or PCSK9-inhibitor prior to discharge.   - Restart atorvastatin 80 mg after cath  #PAD #s/p R BKA  #Chronic LLE limb threatening ischemia  #Tobacco use Follows with [facility]. Home meds are Aspirin 81 mg daily, atorvastatin 80 mg qhs, and clopidogrel 75 mg daily. Patient currently smokes about 4 cigarettes daily, which is a significant decrease from 1 pack per day. Declined nicotine patch.  - Restart ASA, atorvastatin, and clopidogrel after cath  #Severe OSA Sleep study on 03/17/21 with severe OSA, mod O2 desaturation, recommended CPAP therapy on 13 cm H2O with Large size [employer] Full Face Mask Simplus mask and heated humidification. - CPAP ordered    #Depression #Anxiety #PTSD Home meds are Wellbutrin 150 mg BID, Latuda 40 mg qhs, and Zoloft 100 mg tablet daily.  - Restart home meds tonight after cath   #Constipation  Patient endorses mild constipation, last normal BM Friday.  - Miralax daily PRN  #GERD  Home med is Protonix 40 mg daily.  - Restart Protonix after cath   #CXR w/ RLL opacity  #Concern for underlying neoplasm  CT chest from 4/12 with dense right middle lobe consolidation and additional foci of consolidation and nodularity in the right lower lobe,  suspected to be secondary to respiratory infection/pneumonia but recommended follow-up to resolution to exclude underlying neoplasm. CXR today with continued RLL opacity, patient afebrile with no symptoms of pneumonia. Greater concern for possible neoplasm, recommend outpatient follow-up with CT chest.  - Outpatient CT chest    Diet: NPO for procedure, start heart healthy diet this evening IVF: None DVT ppx: Heparin Code: Full  Prior to Admission Living Arrangement: Home Anticipated Discharge Location: Home Barriers to Discharge: Medical stability Dispo: Admit patient to Inpatient with expected length of stay greater than 2 midnights.  Signed: Stefani Dama, Medical Student 07/16/2021, 3:52 PM  Pager: [phone]  Please contact the on call pager after 5 pm and on weekends at [phone]. After 5pm on weekdays and 1pm on weekends: On Call pager: [phone]  I have reviewed the note by Lynford Citizen, MS4 and was present during the interview and physical exam.  I agree with the findings, assessment, and plan.  Lacinda Axon, MD 07/16/2021, 6:17 PM IM Resident, PGY-2 Pager: [phone] Isaiah 41:10

## 2021-07-16 NOTE — ED Notes (Signed)
Pt placed back on 2L [place] for sating 87-88% on RA. Pt now sating 94%. Will continue to monitor.

## 2021-07-16 NOTE — H&P (View-Only) (Signed)
CARDIOLOGY CONSULT NOTE       Patient ID: Ricardo Howard MRN: [id number] DOB/AGE: [birth year] 61 y.o.  Admit date: 07/16/2021 Referring Physician: [facility] Primary Physician: [facility] Primary Cardiologist: [facility] Reason for Consultation: Chest Pain  Active Problems:   * No active hospital problems. *   HPI: 61 y.o. with history of ETOH abuse , smoking poorly controlled DM, HTN, HLD and PVD post right BKA He indicates having two cardiac stents placed over 10 years ago one in [place] and one at [name]. Started having SSCP 5 days ago. Worsening intermittent and more persistent this am No dyspnea or syncope Tachycardic and HTN in ER. ECG non acute but troponin elevated to 2174.  Has had MSO4 and SL nitro with ongoing pain Starting heparin and iv nitro Telemetry with no arrhythmias and hemodynamics stable K 6.1 in setting of normal renal function repeating  CXR normal mediastinum with basilar atelectasis cannot r/o RLL infiltrate Lipase and d dimer negative  BNP, PLT, Hct BUN, Cr normal   ROS All other systems reviewed and negative except as noted above  Past Medical History:  Diagnosis Date   Alcohol abuse 06/14/2019   Diabetes mellitus without complication (HCC)    Essential hypertension 06/14/2019   Mixed hyperlipidemia due to type 2 diabetes mellitus (HCC) 07/31/2019   Nicotine dependence, cigarettes, uncomplicated 07/31/2019   Pancreatitis    Uncontrolled type 2 diabetes mellitus with diabetic polyneuropathy, with long-term current use of insulin 07/31/2019    Family History  Problem Relation Age of Onset   Other Neg Hx     Social History   Socioeconomic History   Marital status: Married    Spouse name: Not on file   Number of children: Not on file   Years of education: Not on file   Highest education level: Not on file  Occupational History   Not on file  Tobacco Use   Smoking status: Every Day    Packs/day: 0.25    Types: Cigarettes    Smokeless tobacco: Never  Vaping Use   Vaping Use: Never used  Substance and Sexual Activity   Alcohol use: Yes    Comment: etoh abuse   Drug use: Not Currently   Sexual activity: Not on file  Other Topics Concern   Not on file  Social History Narrative   Not on file   Social Determinants of Health   Financial Resource Strain: Not on file  Food Insecurity: Not on file  Transportation Needs: Not on file  Physical Activity: Not on file  Stress: Not on file  Social Connections: Not on file  Intimate Partner Violence: Not on file    Past Surgical History:  Procedure Laterality Date   ABDOMINAL AORTOGRAM W/LOWER EXTREMITY N/A 12/02/2020   Procedure: ABDOMINAL AORTOGRAM W/LOWER EXTREMITY;  Surgeon: Leonie Douglas, MD;  [facility];  Service: Cardiovascular;  Laterality: N/A;   AMPUTATION Right 03/13/2020   Procedure: TRANSMETATARSAL AMPUTATION;  Surgeon: Chuck Hint, MD;  Location[facility] OR;  Service: Vascular;  Laterality: Right;   AMPUTATION Right 03/15/2020   Procedure: RIGHT BELOW KNEE AMPUTATION;  Surgeon: Chuck Hint, MD;  Location: [facility] OR;  Service: Vascular;  Laterality: Right;   CORONARY ANGIOPLASTY WITH STENT PLACEMENT     PERIPHERAL VASCULAR INTERVENTION  12/02/2020   Procedure: PERIPHERAL VASCULAR INTERVENTION;  Surgeon: Leonie Douglas, MD;  Location: [facility];  Service: Cardiovascular;;  Lt SFA, LEIA  Current Facility-Administered Medications:    dextrose 50 % solution 50 mL, 1 ampule, Intravenous, Once, Hong, Joshua S, MD   heparin ADULT infusion 100 units/mL (25000 units/26850mL), 1,200 Units/hr, Intravenous, Continuous, Daylene PoseyOriet, Jonathan, RPH   heparin bolus via infusion 4,000 Units, 4,000 Units, Intravenous, Once, Daylene PoseyOriet, Jonathan, RPH   insulin aspart (novoLOG) injection 7 Units, 7 Units, Intravenous, Once, ChinaHong, Eustace MooreJoshua S, MD   nitroGLYCERIN (NITROSTAT) SL tablet 0.4 mg, 0.4 mg, Sublingual, Q5 min PRN, Hong, Eustace MooreJoshua S,  MD   nitroGLYCERIN 50 mg in dextrose 5 % 250 mL (0.2 mg/mL) infusion, 0-200 mcg/min, Intravenous, Continuous, Hong, Eustace MooreJoshua S, MD  Current Outpatient Medications:    acetaminophen (TYLENOL) 500 MG tablet, Take 1,000 mg by mouth 2 (two) times daily as needed for mild pain., Disp: , Rfl:    aspirin 81 MG tablet, Take 81 mg by mouth daily., Disp: , Rfl:    atorvastatin (LIPITOR) 80 MG tablet, Take 80 mg by mouth at bedtime., Disp: , Rfl:    buPROPion (WELLBUTRIN SR) 150 MG 12 hr tablet, Take 150 mg by mouth in the morning and at bedtime., Disp: , Rfl:    carvedilol (COREG) 25 MG tablet, Take 12.5 mg by mouth 2 (two) times daily with a meal., Disp: , Rfl:    clopidogrel (PLAVIX) 75 MG tablet, Take 1 tablet (75 mg total) by mouth daily., Disp: 30 tablet, Rfl: 11   empagliflozin (JARDIANCE) 25 MG TABS tablet, Take 12.5 mg by mouth daily., Disp: , Rfl:    folic acid (FOLVITE) 1 MG tablet, Take 1 tablet (1 mg total) by mouth daily., Disp: , Rfl:    furosemide (LASIX) 20 MG tablet, Take 10 mg by mouth daily., Disp: , Rfl:    gabapentin (NEURONTIN) 300 MG capsule, Take 300-600 mg by mouth See admin instructions. Take 300 mg morning and noon. Take 600 mg at bedtime, Disp: , Rfl:    insulin aspart (NOVOLOG) 100 UNIT/ML FlexPen, Inject 4 Units into the skin 3 (three) times daily with meals. (Patient taking differently: Inject 20 Units into the skin in the morning, at noon, and at bedtime.), Disp: 15 mL, Rfl: 0   insulin glargine (LANTUS) 100 UNIT/ML Solostar Pen, 30units in am and 35unit at night  Further adjustment per your pcp and endocrinology Goal of a1c is less than 7%, currently your a1c is 10%, not at goal, Disp: 15 mL, Rfl: 0   Insulin Pen Needle (NOVOFINE) 30G X 8 MM MISC, Inject 10 each into the skin as needed., Disp: 100 each, Rfl: 0   isosorbide mononitrate (IMDUR) 30 MG 24 hr tablet, Take 30 mg by mouth daily., Disp: , Rfl:    lipase/protease/amylase (CREON) 36000 UNITS CPEP capsule, Take 1 capsule  (36,000 Units total) by mouth 3 (three) times daily before meals., Disp: 270 capsule, Rfl: 0   lurasidone (LATUDA) 20 MG TABS tablet, Take 40 mg by mouth every evening., Disp: , Rfl:    magnesium oxide (MAG-OX) 400 (241.3 Mg) MG tablet, Take 1 tablet (400 mg total) by mouth 2 (two) times daily. (Patient taking differently: Take 400 mg by mouth daily.), Disp: , Rfl:    mesalamine (LIALDA) 1.2 g EC tablet, Take 1.2 g by mouth daily., Disp: , Rfl:    metFORMIN (GLUMETZA) 500 MG (MOD) 24 hr tablet, Take 500 mg by mouth in the morning and at bedtime., Disp: , Rfl:    naloxone (NARCAN) nasal spray 4 mg/0.1 mL, Place 1 spray into the nose as needed (opioid  reversal)., Disp: , Rfl:    naproxen sodium (ALEVE) 220 MG tablet, Take 440 mg by mouth daily as needed (headache)., Disp: , Rfl:    pantoprazole (PROTONIX) 40 MG tablet, Take 40 mg by mouth daily., Disp: , Rfl:    polyethylene glycol (MIRALAX / GLYCOLAX) 17 g packet, Take 17 g by mouth daily as needed for mild constipation., Disp: 14 each, Rfl: 0   psyllium (METAMUCIL) 58.6 % packet, Take 1 packet by mouth daily., Disp: , Rfl:    sertraline (ZOLOFT) 100 MG tablet, Take 100 mg by mouth daily., Disp: , Rfl:    tamsulosin (FLOMAX) 0.4 MG CAPS capsule, Take 0.4 mg by mouth daily., Disp: , Rfl:    thiamine 100 MG tablet, Take 1 tablet (100 mg total) by mouth daily., Disp: , Rfl:    vitamin B-12 (CYANOCOBALAMIN) 500 MCG tablet, Take 500 mcg by mouth daily., Disp: , Rfl:   dextrose  1 ampule Intravenous Once   heparin  4,000 Units Intravenous Once   insulin aspart  7 Units Intravenous Once    heparin     nitroGLYCERIN      Physical Exam: Blood pressure (!) 156/106, pulse (!) 114, temperature 98.3 F (36.8 C), temperature source Oral, resp. rate (!) 23, SpO2 94 %.    Obese black male in distress Lungs clear anteriorly  No murmur but distant heart sounds Abdomen benign  Post RLE BK amputation Peripheral neuropathy LLE with difficult to palpate  pedal puulses  Labs:   Lab Results  Component Value Date   WBC 8.5 07/16/2021   HGB 16.2 07/16/2021   HCT 49.6 07/16/2021   MCV 88.4 07/16/2021   PLT 166 07/16/2021    Recent Labs  Lab 07/16/21 1037  NA 135  K 6.1*  CL 101  CO2 23  BUN 11  CREATININE 0.76  CALCIUM 9.1  PROT PENDING  BILITOT 0.7  ALKPHOS 87  ALT 42  AST 79*  GLUCOSE 286*   Lab Results  Component Value Date   CKTOTAL 628 (H) 10/07/2019   TROPONINI <0.30 08/04/2013    Lab Results  Component Value Date   CHOL 275 (H) 10/08/2019   CHOL 190 08/01/2019   CHOL 258 (H) 06/15/2019   Lab Results  Component Value Date   HDL 42 10/08/2019   HDL 43 08/01/2019   HDL 44 06/15/2019   Lab Results  Component Value Date   LDLCALC 206 (H) 10/08/2019   LDLCALC 124 (H) 08/01/2019   LDLCALC 189 (H) 06/15/2019   Lab Results  Component Value Date   TRIG 137 10/08/2019   TRIG 113 08/01/2019   TRIG 127 06/15/2019   Lab Results  Component Value Date   CHOLHDL 6.5 10/08/2019   CHOLHDL 4.4 08/01/2019   CHOLHDL 5.9 06/15/2019   No results found for: LDLDIRECT    Radiology: [facility] Chest Port 1 View  Result Date: 07/16/2021 CLINICAL DATA:  Chest pain. EXAM: PORTABLE CHEST 1 VIEW COMPARISON:  05/30/2021 chest radiograph, 05/31/2021 CT and prior studies FINDINGS: The cardiomediastinal silhouette is unchanged. Mild pulmonary vascular congestion is noted. Continued RIGHT LOWER lung opacity and mild LEFT basilar atelectasis again noted. No pneumothorax or large pleural effusion identified. No acute bony abnormalities present IMPRESSION: 1. Unchanged RIGHT LOWER lung opacity which may represent atelectasis or airspace disease/pneumonia. 2. Unchanged mild LEFT basilar atelectasis. 3. Mild pulmonary vascular congestion. Electronically Signed   By: Harmon Pier M.D.   On: 07/16/2021 11:22    EKG: ST rate 114  no acute ST changes    ASSESSMENT AND PLAN:    SEMI:  in setting of poorly controlled DM and 61 yo plus  unidentified stents. Troponin 2174  Given tachycardia and HTN will give dose of iv lopressor Starting iv nitro On heparin MSO4 for pain Concern for unidentified by ECG circumflex MI Given ongoing pain have discussed with Dr Geralynn Rile cath today ASAP He is currently working on a STEMI He is not a candidate for lytics with pain 4-5 days and no ST elevation Will get stat echo to assess EF although may be difficult to see endocardium Have discussed with patient ans wife Risks including stroke, bleeding intubation discussed Allergies indicate contrast reaction with Iohexol with hives will give iv benedryl and steroids now. K 6.1 in setting of normal renal function and no supplements will repeat ? Hemolyzed Give one dose of Lokelma  DM:  poorly controlled Wife indicates BS routinely in 300 range per primary service SS insulin  HTN  continue home meds post cath iv lopressor now  HLD  high dose statin Indicates pruritis and cough with simvastatin   Signed: Charlton Haws 07/16/2021, 1:38 PM

## 2021-07-16 NOTE — ED Triage Notes (Signed)
Pt BIB [facility] from home c/o centralized CP and acid reflux that started last night. Pt states the acid reflux went away but the pain is still sharp especially when he takes a deep breath. Pt took 324 of ASA. Per EMS he was 86% on RA. So they placed the pt on 2L.   Pt received 2 Nitros with EMS.   128/91 110 96

## 2021-07-16 NOTE — Interval H&P Note (Signed)
History and Physical Interval Note:  07/16/2021 4:53 PM  Ricardo Howard  has presented today for surgery, with the diagnosis of nstemi.  The various methods of treatment have been discussed with the patient and family. After consideration of risks, benefits and other options for treatment, the patient has consented to  Procedure(s): LEFT HEART CATH AND CORONARY ANGIOGRAPHY (N/A) as a surgical intervention.  The patient's history has been reviewed, patient examined, no change in status, stable for surgery.  I have reviewed the patient's chart and labs.  Questions were answered to the patient's satisfaction.    Cath Lab Visit (complete for each Cath Lab visit)  Clinical Evaluation Leading to the Procedure:   ACS: Yes.    Non-ACS:    Anginal Classification: CCS IV  Anti-ischemic medical therapy: Maximal Therapy (2 or more classes of medications)  Non-Invasive Test Results: No non-invasive testing performed  Prior CABG: No previous CABG        Orbie Pyo

## 2021-07-16 NOTE — Consult Note (Signed)
CARDIOLOGY CONSULT NOTE       Patient ID: Ricardo Howard MRN: [id number] DOB/AGE: 03/08/1960 61 y.o.  Admit date: 07/16/2021 Referring Physician: [facility] Primary Physician: [facility] Primary Cardiologist: [facility] Reason for Consultation: Chest Pain  Active Problems:   * No active hospital problems. *   HPI: 61 y.o. with history of ETOH abuse , smoking poorly controlled DM, HTN, HLD and PVD post right BKA He indicates having two cardiac stents placed over 10 years ago one in [place] and one at [facility]. Started having SSCP 5 days ago. Worsening intermittent and more persistent this am No dyspnea or syncope Tachycardic and HTN in ER. ECG non acute but troponin elevated to 2174.  Has had MSO4 and SL nitro with ongoing pain Starting heparin and iv nitro Telemetry with no arrhythmias and hemodynamics stable K 6.1 in setting of normal renal function repeating  CXR normal mediastinum with basilar atelectasis cannot r/o RLL infiltrate Lipase and d dimer negative  BNP, PLT, Hct BUN, Cr normal   ROS All other systems reviewed and negative except as noted above  Past Medical History:  Diagnosis Date   Alcohol abuse 06/14/2019   Diabetes mellitus without complication (HCC)    Essential hypertension 06/14/2019   Mixed hyperlipidemia due to type 2 diabetes mellitus (HCC) 07/31/2019   Nicotine dependence, cigarettes, uncomplicated 07/31/2019   Pancreatitis    Uncontrolled type 2 diabetes mellitus with diabetic polyneuropathy, with long-term current use of insulin 07/31/2019    Family History  Problem Relation Age of Onset   Other Neg Hx     Social History   Socioeconomic History   Marital status: Married    Spouse name: Not on file   Number of children: Not on file   Years of education: Not on file   Highest education level: Not on file  Occupational History   Not on file  Tobacco Use   Smoking status: Every Day    Packs/day: 0.25    Types: Cigarettes    Smokeless tobacco: Never  Vaping Use   Vaping Use: Never used  Substance and Sexual Activity   Alcohol use: Yes    Comment: etoh abuse   Drug use: Not Currently   Sexual activity: Not on file  Other Topics Concern   Not on file  Social History Narrative   Not on file   Social Determinants of Health   Financial Resource Strain: Not on file  Food Insecurity: Not on file  Transportation Needs: Not on file  Physical Activity: Not on file  Stress: Not on file  Social Connections: Not on file  Intimate Partner Violence: Not on file    Past Surgical History:  Procedure Laterality Date   ABDOMINAL AORTOGRAM W/LOWER EXTREMITY N/A 12/02/2020   Procedure: ABDOMINAL AORTOGRAM W/LOWER EXTREMITY;  Surgeon: Hawken, Thomas N, MD;  Location: [facility];  Service: Cardiovascular;  Laterality: N/A;   AMPUTATION Right 03/13/2020   Procedure: TRANSMETATARSAL AMPUTATION;  Surgeon: Dickson, Christopher S, MD;  Location: [facility] OR;  Service: Vascular;  Laterality: Right;   AMPUTATION Right 03/15/2020   Procedure: RIGHT BELOW KNEE AMPUTATION;  Surgeon: Dickson, Christopher S, MD;  Location: [facility] OR;  Service: Vascular;  Laterality: Right;   CORONARY ANGIOPLASTY WITH STENT PLACEMENT     PERIPHERAL VASCULAR INTERVENTION  12/02/2020   Procedure: PERIPHERAL VASCULAR INTERVENTION;  Surgeon: Hawken, Thomas N, MD;  Location: [facility];  Service: Cardiovascular;;  Lt SFA, LEIA        Current Facility-Administered Medications:    dextrose 50 % solution 50 mL, 1 ampule, Intravenous, Once, Hong, Joshua S, MD   heparin ADULT infusion 100 units/mL (25000 units/26850mL), 1,200 Units/hr, Intravenous, Continuous, Daylene PoseyOriet, Jonathan, RPH   heparin bolus via infusion 4,000 Units, 4,000 Units, Intravenous, Once, Daylene PoseyOriet, Jonathan, RPH   insulin aspart (novoLOG) injection 7 Units, 7 Units, Intravenous, Once, ChinaHong, Eustace MooreJoshua S, MD   nitroGLYCERIN (NITROSTAT) SL tablet 0.4 mg, 0.4 mg, Sublingual, Q5 min PRN, Hong, Eustace MooreJoshua S,  MD   nitroGLYCERIN 50 mg in dextrose 5 % 250 mL (0.2 mg/mL) infusion, 0-200 mcg/min, Intravenous, Continuous, Hong, Eustace MooreJoshua S, MD  Current Outpatient Medications:    acetaminophen (TYLENOL) 500 MG tablet, Take 1,000 mg by mouth 2 (two) times daily as needed for mild pain., Disp: , Rfl:    aspirin 81 MG tablet, Take 81 mg by mouth daily., Disp: , Rfl:    atorvastatin (LIPITOR) 80 MG tablet, Take 80 mg by mouth at bedtime., Disp: , Rfl:    buPROPion (WELLBUTRIN SR) 150 MG 12 hr tablet, Take 150 mg by mouth in the morning and at bedtime., Disp: , Rfl:    carvedilol (COREG) 25 MG tablet, Take 12.5 mg by mouth 2 (two) times daily with a meal., Disp: , Rfl:    clopidogrel (PLAVIX) 75 MG tablet, Take 1 tablet (75 mg total) by mouth daily., Disp: 30 tablet, Rfl: 11   empagliflozin (JARDIANCE) 25 MG TABS tablet, Take 12.5 mg by mouth daily., Disp: , Rfl:    folic acid (FOLVITE) 1 MG tablet, Take 1 tablet (1 mg total) by mouth daily., Disp: , Rfl:    furosemide (LASIX) 20 MG tablet, Take 10 mg by mouth daily., Disp: , Rfl:    gabapentin (NEURONTIN) 300 MG capsule, Take 300-600 mg by mouth See admin instructions. Take 300 mg morning and noon. Take 600 mg at bedtime, Disp: , Rfl:    insulin aspart (NOVOLOG) 100 UNIT/ML FlexPen, Inject 4 Units into the skin 3 (three) times daily with meals. (Patient taking differently: Inject 20 Units into the skin in the morning, at noon, and at bedtime.), Disp: 15 mL, Rfl: 0   insulin glargine (LANTUS) 100 UNIT/ML Solostar Pen, 30units in am and 35unit at night  Further adjustment per your pcp and endocrinology Goal of a1c is less than 7%, currently your a1c is 10%, not at goal, Disp: 15 mL, Rfl: 0   Insulin Pen Needle (NOVOFINE) 30G X 8 MM MISC, Inject 10 each into the skin as needed., Disp: 100 each, Rfl: 0   isosorbide mononitrate (IMDUR) 30 MG 24 hr tablet, Take 30 mg by mouth daily., Disp: , Rfl:    lipase/protease/amylase (CREON) 36000 UNITS CPEP capsule, Take 1 capsule  (36,000 Units total) by mouth 3 (three) times daily before meals., Disp: 270 capsule, Rfl: 0   lurasidone (LATUDA) 20 MG TABS tablet, Take 40 mg by mouth every evening., Disp: , Rfl:    magnesium oxide (MAG-OX) 400 (241.3 Mg) MG tablet, Take 1 tablet (400 mg total) by mouth 2 (two) times daily. (Patient taking differently: Take 400 mg by mouth daily.), Disp: , Rfl:    mesalamine (LIALDA) 1.2 g EC tablet, Take 1.2 g by mouth daily., Disp: , Rfl:    metFORMIN (GLUMETZA) 500 MG (MOD) 24 hr tablet, Take 500 mg by mouth in the morning and at bedtime., Disp: , Rfl:    naloxone (NARCAN) nasal spray 4 mg/0.1 mL, Place 1 spray into the nose as needed (opioid  reversal)., Disp: , Rfl:    naproxen sodium (ALEVE) 220 MG tablet, Take 440 mg by mouth daily as needed (headache)., Disp: , Rfl:    pantoprazole (PROTONIX) 40 MG tablet, Take 40 mg by mouth daily., Disp: , Rfl:    polyethylene glycol (MIRALAX / GLYCOLAX) 17 g packet, Take 17 g by mouth daily as needed for mild constipation., Disp: 14 each, Rfl: 0   psyllium (METAMUCIL) 58.6 % packet, Take 1 packet by mouth daily., Disp: , Rfl:    sertraline (ZOLOFT) 100 MG tablet, Take 100 mg by mouth daily., Disp: , Rfl:    tamsulosin (FLOMAX) 0.4 MG CAPS capsule, Take 0.4 mg by mouth daily., Disp: , Rfl:    thiamine 100 MG tablet, Take 1 tablet (100 mg total) by mouth daily., Disp: , Rfl:    vitamin B-12 (CYANOCOBALAMIN) 500 MCG tablet, Take 500 mcg by mouth daily., Disp: , Rfl:   dextrose  1 ampule Intravenous Once   heparin  4,000 Units Intravenous Once   insulin aspart  7 Units Intravenous Once    heparin     nitroGLYCERIN      Physical Exam: Blood pressure (!) 156/106, pulse (!) 114, temperature 98.3 F (36.8 C), temperature source Oral, resp. rate (!) 23, SpO2 94 %.    Obese black male in distress Lungs clear anteriorly  No murmur but distant heart sounds Abdomen benign  Post RLE BK amputation Peripheral neuropathy LLE with difficult to palpate  pedal puulses  Labs:   Lab Results  Component Value Date   WBC 8.5 07/16/2021   HGB 16.2 07/16/2021   HCT 49.6 07/16/2021   MCV 88.4 07/16/2021   PLT 166 07/16/2021    Recent Labs  Lab 07/16/21 1037  NA 135  K 6.1*  CL 101  CO2 23  BUN 11  CREATININE 0.76  CALCIUM 9.1  PROT PENDING  BILITOT 0.7  ALKPHOS 87  ALT 42  AST 79*  GLUCOSE 286*   Lab Results  Component Value Date   CKTOTAL 628 (H) 10/07/2019   TROPONINI <0.30 08/04/2013    Lab Results  Component Value Date   CHOL 275 (H) 10/08/2019   CHOL 190 08/01/2019   CHOL 258 (H) 06/15/2019   Lab Results  Component Value Date   HDL 42 10/08/2019   HDL 43 08/01/2019   HDL 44 06/15/2019   Lab Results  Component Value Date   LDLCALC 206 (H) 10/08/2019   LDLCALC 124 (H) 08/01/2019   LDLCALC 189 (H) 06/15/2019   Lab Results  Component Value Date   TRIG 137 10/08/2019   TRIG 113 08/01/2019   TRIG 127 06/15/2019   Lab Results  Component Value Date   CHOLHDL 6.5 10/08/2019   CHOLHDL 4.4 08/01/2019   CHOLHDL 5.9 06/15/2019   No results found for: LDLDIRECT    Radiology: [facility] Chest Port 1 View  Result Date: 07/16/2021 CLINICAL DATA:  Chest pain. EXAM: PORTABLE CHEST 1 VIEW COMPARISON:  05/30/2021 chest radiograph, 05/31/2021 CT and prior studies FINDINGS: The cardiomediastinal silhouette is unchanged. Mild pulmonary vascular congestion is noted. Continued RIGHT LOWER lung opacity and mild LEFT basilar atelectasis again noted. No pneumothorax or large pleural effusion identified. No acute bony abnormalities present IMPRESSION: 1. Unchanged RIGHT LOWER lung opacity which may represent atelectasis or airspace disease/pneumonia. 2. Unchanged mild LEFT basilar atelectasis. 3. Mild pulmonary vascular congestion. Electronically Signed   By: Harmon Pier M.D.   On: 07/16/2021 11:22    EKG: ST rate 114  no acute ST changes    ASSESSMENT AND PLAN:    SEMI:  in setting of poorly controlled DM and 61 yo plus  unidentified stents. Troponin 2174  Given tachycardia and HTN will give dose of iv lopressor Starting iv nitro On heparin MSO4 for pain Concern for unidentified by ECG circumflex MI Given ongoing pain have discussed with Dr Geralynn Rile cath today ASAP He is currently working on a STEMI He is not a candidate for lytics with pain 4-5 days and no ST elevation Will get stat echo to assess EF although may be difficult to see endocardium Have discussed with patient ans wife Risks including stroke, bleeding intubation discussed Allergies indicate contrast reaction with Iohexol with hives will give iv benedryl and steroids now. K 6.1 in setting of normal renal function and no supplements will repeat ? Hemolyzed Give one dose of Lokelma  DM:  poorly controlled Wife indicates BS routinely in 300 range per primary service SS insulin  HTN  continue home meds post cath iv lopressor now  HLD  high dose statin Indicates pruritis and cough with simvastatin   Signed: Charlton Haws 07/16/2021, 1:38 PM

## 2021-07-16 NOTE — Progress Notes (Signed)
Inpatient Diabetes Program Recommendations  AACE/ADA: New Consensus Statement on Inpatient Glycemic Control (2015)  Target Ranges:  Prepandial:   less than 140 mg/dL      Peak postprandial:   less than 180 mg/dL (1-2 hours)      Critically ill patients:  140 - 180 mg/dL    Latest Reference Range & Units 07/16/21 10:37  Sodium 135 - 145 mmol/L 135  Potassium 3.5 - 5.1 mmol/L 6.1 (H)  Chloride 98 - 111 mmol/L 101  CO2 22 - 32 mmol/L 23  Glucose 70 - 99 mg/dL 093 (H)  BUN 8 - 23 mg/dL 11  Creatinine 2.35 - 5.73 mg/dL 2.20  Calcium 8.9 - 25.4 mg/dL 9.1  Anion gap 5 - 15  11  (H): Data is abnormally high   Admit with: STEMI  History: DM, ETOH Abuse  Home DM Meds: Jardiance 12.5 mg daily        Novolog 20 units TID with meals        Lantus 30 units AM/ 35 units PM  Current Orders: Novolog Moderate Correction Scale/ SSI (0-15 units) TID AC + HS     Novolog 4 units TID with meals        MD- Note Novolog orders placed today  Please consider starting Semglee Insulin as well:  Could start with 50% home dose and Titrate as needed: Semglee 15 units BID   Admitted 05/31/2021 through 06/03/2021 for CP/ DKA Counseled by Diabetes Coordinator RN on 05/31/2021 Discharged home 06/03/2021 with the following Rxs: Lantus 30 units AM/ 35 units PM Novolog 4 units TID with meals Jardiance 12.5 mg daily Metformin 500 mg BID Follow up with PCP at the [facility]    --Will follow patient during hospitalization--  Ambrose Finland RN, MSN, CDE Diabetes Coordinator Inpatient Glycemic Control Team Team Pager: [phone] (8a-5p)

## 2021-07-16 NOTE — Progress Notes (Signed)
*  PRELIMINARY RESULTS* Echocardiogram 2D Echocardiogram has been performed.  Carolyne Fiscal 07/16/2021, 2:32 PM

## 2021-07-17 DIAGNOSIS — I10 Essential (primary) hypertension: Secondary | ICD-10-CM

## 2021-07-17 DIAGNOSIS — I214 Non-ST elevation (NSTEMI) myocardial infarction: Secondary | ICD-10-CM | POA: Diagnosis not present

## 2021-07-17 LAB — GLUCOSE, CAPILLARY
Glucose-Capillary: 236 mg/dL — ABNORMAL HIGH (ref 70–99)
Glucose-Capillary: 236 mg/dL — ABNORMAL HIGH (ref 70–99)
Glucose-Capillary: 268 mg/dL — ABNORMAL HIGH (ref 70–99)
Glucose-Capillary: 277 mg/dL — ABNORMAL HIGH (ref 70–99)
Glucose-Capillary: 341 mg/dL — ABNORMAL HIGH (ref 70–99)
Glucose-Capillary: 379 mg/dL — ABNORMAL HIGH (ref 70–99)

## 2021-07-17 LAB — BASIC METABOLIC PANEL
Anion gap: 6 (ref 5–15)
BUN: 9 mg/dL (ref 8–23)
CO2: 24 mmol/L (ref 22–32)
Calcium: 8.8 mg/dL — ABNORMAL LOW (ref 8.9–10.3)
Chloride: 106 mmol/L (ref 98–111)
Creatinine, Ser: 0.75 mg/dL (ref 0.61–1.24)
GFR, Estimated: >60 mL/min (ref >60)
Glucose, Bld: 344 mg/dL — ABNORMAL HIGH (ref 70–99)
Potassium: 4.1 mmol/L (ref 3.5–5.1)
Sodium: 136 mmol/L (ref 135–145)

## 2021-07-17 LAB — CBC
HCT: 45.9 % (ref 39.0–52.0)
Hemoglobin: 15 g/dL (ref 13.0–17.0)
MCH: 28.9 pg (ref 26.0–34.0)
MCHC: 32.7 g/dL (ref 30.0–36.0)
MCV: 88.4 fL (ref 80.0–100.0)
Platelets: UNDETERMINED 10*3/uL (ref 150–400)
RBC: 5.19 MIL/uL (ref 4.22–5.81)
RDW: 20.3 % — ABNORMAL HIGH (ref 11.5–15.5)
WBC: 8.9 10*3/uL (ref 4.0–10.5)
nRBC: 0 % (ref 0.0–0.2)

## 2021-07-17 MED ORDER — INSULIN GLARGINE-YFGN 100 UNIT/ML ~~LOC~~ SOLN
20.0000 [IU] | Freq: Two times a day (BID) | SUBCUTANEOUS | Status: DC
  Administered 2021-07-17 – 2021-07-19 (×5): 20 [IU] via SUBCUTANEOUS
  Filled 2021-07-17 (×6): qty 0.2

## 2021-07-17 MED ORDER — EZETIMIBE 10 MG PO TABS
10.0000 mg | ORAL_TABLET | Freq: Every day | ORAL | Status: DC
  Administered 2021-07-17 – 2021-07-19 (×3): 10 mg via ORAL
  Filled 2021-07-17 (×3): qty 1

## 2021-07-17 MED ORDER — LOSARTAN POTASSIUM 25 MG PO TABS: 12.5000 mg | ORAL_TABLET | Freq: Every day | ORAL | Status: DC

## 2021-07-17 MED ORDER — ENOXAPARIN SODIUM 60 MG/0.6ML IJ SOSY
50.0000 mg | PREFILLED_SYRINGE | INTRAMUSCULAR | Status: DC
Start: 2021-07-17 — End: 2021-07-19
  Administered 2021-07-17 – 2021-07-18 (×2): 50 mg via SUBCUTANEOUS
  Filled 2021-07-17 (×2): qty 0.6

## 2021-07-17 MED ORDER — EMPAGLIFLOZIN 25 MG PO TABS
25.0000 mg | ORAL_TABLET | Freq: Every day | ORAL | Status: DC
  Administered 2021-07-17 – 2021-07-19 (×3): 25 mg via ORAL
  Filled 2021-07-17 (×3): qty 1

## 2021-07-17 MED ORDER — ISOSORBIDE MONONITRATE ER 60 MG PO TB24
60.0000 mg | ORAL_TABLET | Freq: Every day | ORAL | Status: DC
  Administered 2021-07-17 – 2021-07-19 (×3): 60 mg via ORAL
  Filled 2021-07-17 (×3): qty 1

## 2021-07-17 MED ORDER — INSULIN GLARGINE-YFGN 100 UNIT/ML ~~LOC~~ SOLN
20.0000 [IU] | Freq: Two times a day (BID) | SUBCUTANEOUS | Status: DC
Start: 2021-07-17 — End: 2021-07-17

## 2021-07-17 MED ORDER — LOSARTAN POTASSIUM 25 MG PO TABS
12.5000 mg | ORAL_TABLET | Freq: Every day | ORAL | Status: DC
  Administered 2021-07-17 – 2021-07-19 (×3): 12.5 mg via ORAL
  Filled 2021-07-17 (×3): qty 1

## 2021-07-17 NOTE — Progress Notes (Signed)
RT Note:  Per patient, he does not wear CPAP at home at this time. He did recently have a sleep study and is awaiting results/equipment. He wore a CPAP several years ago. At this time there are no CPAP machines available. Patient is currently on 2L [place]. Spo2 97%. Once equipment is available patient will be set up for HS CPAP. RN aware.

## 2021-07-17 NOTE — Progress Notes (Signed)
Internal Medicine Attending:   I saw and examined the patient. I reviewed the resident's H&P note and I agree with the resident's findings and plan as documented in the resident's note.  In brief, patient is a 61 year old male with past medical history of uncontrolled type 2 diabetes, heart failure with moderately his ejection fraction (EF of 40 to 45%), hypertension, hyperlipidemia, severe OSA, PAD status post right BKA, alcohol use disorder, recurrent pancreatitis, recent hospitalization for sepsis secondary to community-acquired pneumonia and DKA who presented with chest pain over the last 5 days.  Patient states that approximately 5 days prior to admission he developed intermittent episodes of chest pain which he attributed to GERD.  On the night prior to his admission he states that the chest pain became more severe across his entire chest and radiating to his left shoulder.  Patient had associated diaphoresis, nausea and shortness of breath.  Patient came to the ED for further evaluation and was noted to be tachycardic, tachypneic with troponins elevated 2174 and EKG with no acute ST/T wave changes consistent with a non-ST elevation MI.  Patient is admitted to the medicine service for further management.  Today, patient states his chest pain is much improved.  On exam, patient is oriented x3 and in no apparent distress.  Lungs are clear to auscultation bilaterally.  Cardiovascular exam reveals regular rate and rhythm with normal heart sounds.  Patient is noted to have a right below-knee amputation but no edema noted in left lower extremity.  Patient has a normal mood and affect.  Abdomen is soft, nontender, nondistended with normoactive bowel sounds.  Patient was admitted to the hospital with a non-ST elevation MI in the setting of a history of hypertension, type 2 diabetes and CAD as well as strong family history of coronary disease.  Patient was initially started on heparin drip and seen by  cardiology.  Patient was taken for an urgent cardiac catheterization which showed 95% stenosis in the mid RCA status post drug-eluting stent placement.  Patient was also noted to have moderate disease in the left circumflex and LAD.  2D echo done showed mild reduced EF of 40 to 45% with septal hypokinesis and mid/basal wall akinesis.  Heparin drip was discontinued and patient is much improved status post cath.  We will continue dual antiplatelet therapy with Brilinta and aspirin indefinitely.  We will continue with Coreg and Imdur as well as high intensity statin.  No further work-up at this time.  Of note, patient did have hyperkalemia on admission but suspect this is secondary to hemolyzed specimen.  Patient did receive 1 dose of Lokelma and patient's potassium today was 4.1.  The patient potassium remains within normal limits consider adding an ARB given mildly reduced ejection fraction and history of combined CHF.  Patient may also benefit from addition of an SGLT2 inhibitor.  Patient also has modifiable risk factors including smoking, alcohol use as well as uncontrolled type 2 diabetes.  We will attempt to titrate patient's insulin today for better glycemic control.  Patient was counseled on smoking and alcohol cessation as well.  No further work-up at this time.  I suspect patient should be stable for DC home in a.m.

## 2021-07-17 NOTE — Progress Notes (Signed)
Progress Note  Patient Name: Ricardo Howard Date of Encounter: 07/17/2021  [facility] Cardiologist: None New  Subjective   Feels better today. Just a twinge of chest pain. Still has some SOB.   Inpatient Medications    Scheduled Meds:  aspirin  81 mg Oral Daily   atorvastatin  80 mg Oral QHS   buPROPion ER  150 mg Oral BID   carvedilol  12.5 mg Oral BID WC   furosemide  20 mg Oral Daily   gabapentin  300 mg Oral BID   gabapentin  600 mg Oral QHS   insulin aspart  0-15 Units Subcutaneous TID WC   insulin aspart  0-5 Units Subcutaneous QHS   insulin aspart  4 Units Subcutaneous TID WC   insulin glargine-yfgn  15 Units Subcutaneous BID   lipase/protease/amylase  36,000 Units Oral TID AC   lurasidone  40 mg Oral QHS   metoprolol tartrate  5 mg Intravenous NOW   pantoprazole  40 mg Oral Daily   sertraline  100 mg Oral Daily   sodium chloride flush  3 mL Intravenous Q12H   sodium chloride flush  3 mL Intravenous Q12H   sodium zirconium cyclosilicate  10 g Oral Once   ticagrelor  90 mg Oral BID   Continuous Infusions:  sodium chloride     nitroGLYCERIN Stopped (07/16/21 1911)   PRN Meds: sodium chloride, acetaminophen, nitroGLYCERIN, ondansetron (ZOFRAN) IV, polyethylene glycol, sodium chloride flush   Vital Signs    Vitals:   07/16/21 2133 07/17/21 0022 07/17/21 0445 07/17/21 0731  BP: 133/89 (!) 141/102 126/88 110/83  Pulse: (!) 105 99 92 92  Resp:  18 18 17   Temp:  98.3 F (36.8 C) 97.9 F (36.6 C) 98 F (36.7 C)  TempSrc:  Oral Oral Oral  SpO2: 96% 97% 95% 93%  Weight:  101.6 kg    Height:  5\' 8"  (1.727 m)      Intake/Output Summary (Last 24 hours) at 07/17/2021 0733 Last data filed at 07/17/2021 0450 Gross per 24 hour  Intake 892.5 ml  Output 2200 ml  Net -1307.5 ml      07/17/2021   12:22 AM 05/30/2021    8:07 PM 05/02/2021    1:08 PM  Last 3 Weights  Weight (lbs) 223 lb 15.8 oz 265 lb 240 lb  Weight (kg) 101.6 kg 120.203 kg 108.863 kg       Telemetry    NSR - Personally Reviewed  ECG    07/16/21 at 2232- NSR with very mild ST elevation in inferior leads.  - Personally Reviewed  Physical Exam   GEN: No acute distress.  obese Neck: No JVD Cardiac: RRR, no murmurs, rubs, or gallops.  Respiratory: Clear to auscultation bilaterally. GI: Soft, nontender, non-distended  MS: No edema; No deformity. Radial site with mild bruising- no hematoma. Neuro:  Nonfocal  Psych: Normal affect   Labs    High Sensitivity Troponin:   Recent Labs  Lab 07/16/21 1037 07/16/21 1237  TROPONINIHS 2,174* 2,022*     Chemistry Recent Labs  Lab 07/16/21 1037 07/16/21 1346 07/16/21 1431 07/17/21 0112  NA 135 136 136 136  K 6.1* 4.0 6.8* 4.1  CL 101 104  --  106  CO2 23 22  --  24  GLUCOSE 286* 297*  --  344*  BUN 11 6*  --  9  CREATININE 0.76 0.68  --  0.75  CALCIUM 9.1 8.8*  --  8.8*  MG  --  1.8  --   --   PROT RESULTS UNAVAILABLE DUE TO INTERFERING SUBSTANCE  --   --   --   ALBUMIN 3.7  --   --   --   AST 79*  --   --   --   ALT 42  --   --   --   ALKPHOS 87  --   --   --   BILITOT 0.7  --   --   --   GFRNONAA >60 >60  --  >60  ANIONGAP 11 10  --  6    Lipids  Recent Labs  Lab 07/16/21 1448  CHOL 249*  TRIG 58  HDL 44  LDLCALC 193*  CHOLHDL 5.7    Hematology Recent Labs  Lab 07/16/21 1037 07/16/21 1431 07/17/21 0112  WBC 8.5  --  8.9  RBC 5.61  --  5.19  HGB 16.2 17.0 15.0  HCT 49.6 50.0 45.9  MCV 88.4  --  88.4  MCH 28.9  --  28.9  MCHC 32.7  --  32.7  RDW 20.6*  --  20.3*  PLT 166  --  PLATELET CLUMPS NOTED ON SMEAR, UNABLE TO ESTIMATE   Thyroid  Recent Labs  Lab 07/16/21 1415  TSH 0.580    BNP Recent Labs  Lab 07/16/21 1037  BNP 56.4    DDimer  Recent Labs  Lab 07/16/21 1037  DDIMER 0.40     Radiology    CARDIAC CATHETERIZATION  Result Date: 07/16/2021   Mid RCA lesion is 20% stenosed.   Prox RCA lesion is 95% stenosed.   Prox Cx lesion is 40% stenosed.   Mid LAD lesion is 50%  stenosed.   A stent was successfully placed.   Post intervention, there is a 0% residual stenosis.   LV end diastolic pressure is mildly elevated. 1.  High-grade calcified proximal right coronary artery lesion treated with atherectomy and 1 drug-eluting stent with patent previously placed mid to distal right coronary artery stent and proximal left circumflex stent, the latter with moderate in-stent restenosis. 2.  Very focal mid moderate LAD lesion. 3.  LVEDP of 19 mmHg. Recommendation: Dual antiplatelet therapy for 1 year.   DG Chest Port 1 View  Result Date: 07/16/2021 CLINICAL DATA:  Chest pain. EXAM: PORTABLE CHEST 1 VIEW COMPARISON:  05/30/2021 chest radiograph, 05/31/2021 CT and prior studies FINDINGS: The cardiomediastinal silhouette is unchanged. Mild pulmonary vascular congestion is noted. Continued RIGHT LOWER lung opacity and mild LEFT basilar atelectasis again noted. No pneumothorax or large pleural effusion identified. No acute bony abnormalities present IMPRESSION: 1. Unchanged RIGHT LOWER lung opacity which may represent atelectasis or airspace disease/pneumonia. 2. Unchanged mild LEFT basilar atelectasis. 3. Mild pulmonary vascular congestion. Electronically Signed   By: Harmon PierJeffrey  Hu M.D.   On: 07/16/2021 11:22   ECHOCARDIOGRAM LIMITED  Result Date: 07/16/2021    ECHOCARDIOGRAM LIMITED REPORT   Patient Name:   Ricardo Howard Date of Exam: 07/16/2021 Medical Rec #:  [id number]         Height:       68.0 in Accession #:    [id number]        Weight:       265.0 lb Date of Birth:  11/13/1960          BSA:          2.303 m Patient Age:    61 years          BP:  159/106 mmHg Patient Gender: M                 HR:           116 bpm. Exam Location:  Inpatient Procedure: Limited Echo Indications:    Acute MI  History:        Patient has prior history of Echocardiogram examinations, most                 recent 05/31/2021. Risk Factors:Hypertension, Diabetes,                 Dyslipidemia and  Current Smoker. ETOH Abuse\.  Sonographer:    Mikki Harbor Referring Phys: [phone] Wendall Stade IMPRESSIONS  1. Septal hypokinesis mid/basal wall akinesis . Left ventricular ejection fraction, by estimation, is 45 to 50%. The left ventricle has mildly decreased function. The left ventricle demonstrates regional wall motion abnormalities (see scoring diagram/findings for description). The left ventricular internal cavity size was mildly dilated. There is mild left ventricular hypertrophy.  2. Right ventricular systolic function is normal. The right ventricular size is normal.  3. The mitral valve is normal in structure. No evidence of mitral valve regurgitation.  4. The aortic valve is tricuspid. There is moderate calcification of the aortic valve. There is mild thickening of the aortic valve. Aortic valve sclerosis/calcification is present, without any evidence of aortic stenosis. FINDINGS  Left Ventricle: Septal hypokinesis mid/basal wall akinesis. Left ventricular ejection fraction, by estimation, is 45 to 50%. The left ventricle has mildly decreased function. The left ventricle demonstrates regional wall motion abnormalities. The left ventricular internal cavity size was mildly dilated. There is mild left ventricular hypertrophy. Right Ventricle: The right ventricular size is normal. Right vetricular wall thickness was not assessed. Right ventricular systolic function is normal. Pericardium: There is no evidence of pericardial effusion. Mitral Valve: The mitral valve is normal in structure. There is mild thickening of the mitral valve leaflet(s). Aortic Valve: The aortic valve is tricuspid. There is moderate calcification of the aortic valve. There is mild thickening of the aortic valve. Aortic valve sclerosis/calcification is present, without any evidence of aortic stenosis. LEFT VENTRICLE PLAX 2D LVIDd:         5.00 cm LVIDs:         3.20 cm LV PW:         1.00 cm LV IVS:        1.00 cm LVOT diam:     2.10  cm LVOT Area:     3.46 cm  LEFT ATRIUM         Index LA diam:    2.90 cm 1.26 cm/m   AORTA Ao Root diam: 3.30 cm  SHUNTS Systemic Diam: 2.10 cm Charlton Haws MD Electronically signed by Charlton Haws MD Signature Date/Time: 07/16/2021/2:30:27 PM    Final     Cardiac Studies   Echo 07/16/21: IMPRESSIONS     1. Septal hypokinesis mid/basal wall akinesis . Left ventricular ejection  fraction, by estimation, is 45 to 50%. The left ventricle has mildly  decreased function. The left ventricle demonstrates regional wall motion  abnormalities (see scoring  diagram/findings for description). The left ventricular internal cavity  size was mildly dilated. There is mild left ventricular hypertrophy.   2. Right ventricular systolic function is normal. The right ventricular  size is normal.   3. The mitral valve is normal in structure. No evidence of mitral valve  regurgitation.   4. The aortic valve is tricuspid. There  is moderate calcification of the  aortic valve. There is mild thickening of the aortic valve. Aortic valve  sclerosis/calcification is present, without any evidence of aortic  stenosis.   Cardiac cath 07/16/21:  CORONARY STENT INTERVENTION  LEFT HEART CATH AND CORONARY ANGIOGRAPHY   Conclusion      Mid RCA lesion is 20% stenosed.   Prox RCA lesion is 95% stenosed.   Prox Cx lesion is 40% stenosed.   Mid LAD lesion is 50% stenosed.   A stent was successfully placed.   Post intervention, there is a 0% residual stenosis.   LV end diastolic pressure is mildly elevated.   1.  High-grade calcified proximal right coronary artery lesion treated with atherectomy and 1 drug-eluting stent with patent previously placed mid to distal right coronary artery stent and proximal left circumflex stent, the latter with moderate in-stent restenosis. 2.  Very focal mid moderate LAD lesion. 3.  LVEDP of 19 mmHg.   Recommendation: Dual antiplatelet therapy for 1 year.   Dominance:  Right Intervention   Patient Profile     61 y.o. male with poorly controlled DM, HTN, HLD and known CAD s/p stenting of the proximal LCX and distal RCA presents with ACS  Assessment & Plan    NSTEMI. Troponin peak 2174. Ecg initially nondiagnostic. Echo with mildly reduced EF. Patient underwent  emergent cardiac cath last night for ongoing pain. Culprit is high grade stenosis in proximal to mid RCA treated with atherectomy and long DES. Now on Brilinta and ASA. Would favor DAPT indefinitely given length and number of stents. Patient has residual moderate disease in the proximal LAD and LCx. Will treat medically. On Coreg. Will resume Imdur. High dose statin therapy. Increase activity today. DM poor control. On insulin. Per primary team. Diabetic neuropathy.  HTN. Improved this am. Can titrate Coreg and nitrates as tolerated. If potassium remains normal can consider losartan for LV dysfunction. Consider switching to [facility] if affordable.  Acute on chronic combined CHF. EF 45-50%. EDP 19 mm Hg at cath. On Coreg, nitrates. Consider adding losartan or Entresto if potassium remains normal. Case management/ pharmacy to look into affordability of Entresto and SGLT2 inhibitor.  HLD on high dose statin.  PAD s/p right  BKA. Tobacco abuse. Counsel on smoking cessation Etoh abuse. On CIWA protocol.  Severe OSA on CPAP.       For questions or updates, please contact [facility] Please consult [email or web address] for contact info under        Signed, Dhrithi Riche [place], MD  07/17/2021, 7:33 AM

## 2021-07-17 NOTE — Progress Notes (Addendum)
Inpatient Diabetes Program Recommendations  AACE/ADA: New Consensus Statement on Inpatient Glycemic Control (2015)  Target Ranges:  Prepandial:   less than 140 mg/dL      Peak postprandial:   less than 180 mg/dL (1-2 hours)      Critically ill patients:  140 - 180 mg/dL   Lab Results  Component Value Date   GLUCAP 277 (H) 07/17/2021   HGBA1C 10.9 (H) 07/16/2021    Review of Glycemic Control  Diabetes history: DM 2 Outpatient Diabetes medications: Jardiance 12.5 mg Daily, Lantus 30 units qam, 35 units qpm, Novolog 20 units tid Current orders for Inpatient glycemic control:  Semglee 15 units bid Novolog 0-15 units tid + hs Novolog 4 units tid  10.9% on 5/28 Solumedrol 100 mg given yesterday  Inpatient Diabetes Program Recommendations:    -  Increase Semglee to 20 units bid  Spoke with pt at bedside regarding A1c and glucose control at home. I have spoken with pt during a previous admission on 4/12 regarding and A1c of 10.4% in the past. Pt had reported not taking Novolog consistently with the meals. Since that time pt states that when he started to take the Novolog consistently he had many hypoglycemia episodes. He said he reduced the dose and the levels have been better. PT states his glucose levels are in the upper 100-200 range all the time. Discussed importance of glucose control. Reviewed again Glucose and A1c goals. Pt reports having all supplies at for self management at home.  Thanks,  Tama Headings RN, MSN, BC-ADM Inpatient Diabetes Coordinator Team Pager [phone] (8a-5p)

## 2021-07-17 NOTE — Progress Notes (Signed)
Occupational Therapy Evaluation Patient Details Name: Ricardo Howard MRN: [id number] DOB: 14-May-1960 Today's Date: 07/17/2021   History of Present Illness 61 yo male admitted 5/28 with chest pain, NSTEMI s/p cardiac cath 5/28. PMhx: PAD s/p Rt BKA, ETOH use disorder, pancreatitis, T2DM, CHF, HTN, HLD, OSA   Clinical Impression   PT admitted with Nstemi. Pt currently with functional limitiations due to the deficits listed below (see OT problem list). Pt completed session RA 92% or higher . Pt with HR 106 max during session. Pt required cues for pacing and does report feeling sob despite 92% reading.  Pt will benefit from skilled OT to increase their independence and safety with adls and balance to allow discharge home with wife.       Recommendations for follow up therapy are one component of a multi-disciplinary discharge planning process, led by the attending physician.  Recommendations may be updated based on patient status, additional functional criteria and insurance authorization.   Follow Up Recommendations  No OT follow up    Assistance Recommended at Discharge None  Patient can return home with the following      Functional Status Assessment  Patient has had a recent decline in their functional status and demonstrates the ability to make significant improvements in function in a reasonable and predictable amount of time.  Equipment Recommendations  None recommended by OT    Recommendations for Other Services       Precautions / Restrictions Precautions Precautions: Fall Restrictions RUE Weight Bearing: Weight bearing as tolerated      Mobility Bed Mobility Overal bed mobility: Modified Independent                  Transfers Overall transfer level: Needs assistance   Transfers: Sit to/from Stand Sit to Stand: Supervision                  Balance Overall balance assessment: Needs assistance   Sitting balance-Leahy Scale: Good     Standing  balance support: Bilateral upper extremity supported Standing balance-Leahy Scale: Fair                             ADL either performed or assessed with clinical judgement   ADL Overall ADL's : At baseline                                       General ADL Comments: pt able to don prosthetic and doff.  Educated and handout provided for energy conservation   Vision Baseline Vision/History: 1 Wears glasses Ability to See in Adequate Light: 0 Adequate Additional Comments: prescription glasses for reading     Perception     Praxis      Pertinent Vitals/Pain Pain Assessment Pain Assessment: No/denies pain     Hand Dominance Right   Extremity/Trunk Assessment Upper Extremity Assessment Upper Extremity Assessment: Overall WFL for tasks assessed   Lower Extremity Assessment Lower Extremity Assessment: Defer to PT evaluation RLE Deficits / Details: BKA with prosthesis   Cervical / Trunk Assessment Cervical / Trunk Assessment: Normal;Other exceptions (head forward position)   Communication Communication Communication: No difficulties   Cognition Arousal/Alertness: Awake/alert Behavior During Therapy: WFL for tasks assessed/performed Overall Cognitive Status: Within Functional Limits for tasks assessed  General Comments  Ra throughout session    Exercises     Shoulder Instructions      Home Living Family/patient expects to be discharged to:: Private residence Living Arrangements: Spouse/significant other Available Help at Discharge: Family;Available PRN/intermittently Type of Home: House Home Access: Stairs to enter [employer] of Steps: 5 Entrance Stairs-Rails: Can reach both Home Layout: One level     Bathroom Shower/Tub: Producer, television/film/video: Standard     Home Equipment: Agricultural consultant (2 wheels);Crutches;Wheelchair - Surveyor, mining  (4 wheels);Shower seat;Grab bars - tub/shower   Additional Comments: independent with prosthetic      Prior Functioning/Environment Prior Level of Function : Independent/Modified Independent;Driving             Mobility Comments: independent with use of prosthetic, gardens for activity at home, and walks in mall ADLs Comments: independent        OT Problem List: Decreased strength;Impaired balance (sitting and/or standing);Decreased activity tolerance      OT Treatment/Interventions: Self-care/ADL training;Therapeutic exercise;Energy conservation;DME and/or AE instruction;Therapeutic activities;Patient/family education;Balance training    OT Goals(Current goals can be found in the care plan section) Acute Rehab OT Goals Patient Stated Goal: to be able to tend to garden OT Goal Formulation: With patient Time For Goal Achievement: 07/31/21 Potential to Achieve Goals: Good  OT Frequency: Min 2X/week    Co-evaluation              AM-PAC OT "6 Clicks" Daily Activity     Outcome Measure Help from another person eating meals?: None Help from another person taking care of personal grooming?: None Help from another person toileting, which includes using toliet, bedpan, or urinal?: None Help from another person bathing (including washing, rinsing, drying)?: None Help from another person to put on and taking off regular upper body clothing?: None Help from another person to put on and taking off regular lower body clothing?: None 6 Click Score: 24   End of Session Equipment Utilized During Treatment: Gait belt;Rolling walker (2 wheels) Nurse Communication: Mobility status;Precautions  Activity Tolerance: Patient tolerated treatment well Patient left: in bed;with call bell/phone within reach;with family/visitor present  OT Visit Diagnosis: Unsteadiness on feet (R26.81);Muscle weakness (generalized) (M62.81)                Time: 4496-7591 OT Time Calculation (min): 23  min Charges:  OT General Charges $OT Visit: 1 Visit OT Evaluation $OT Eval Moderate Complexity: 1 Mod   Brynn, OTR/L  Acute Rehabilitation Services Office: [phone] .   Mateo Flow 07/17/2021, 2:33 PM

## 2021-07-17 NOTE — Progress Notes (Signed)
SATURATION QUALIFICATIONS: (This note is used to comply with regulatory documentation for home oxygen)  Patient Saturations on Room Air at Rest = 96%  Patient Saturations on Room Air while Ambulating = 86%  Patient Saturations on 1 Liters of oxygen while Ambulating = 91%

## 2021-07-17 NOTE — Evaluation (Signed)
Physical Therapy Evaluation Patient Details Name: Ricardo Howard MRN: [id number] DOB: May 25, 1960 Today's Date: 07/17/2021  History of Present Illness  61 yo male admitted 5/28 with chest pain, NSTEMI s/p cardiac cath 5/28. PMhx: PAD s/p Rt BKA, ETOH use disorder, pancreatitis, T2DM, CHF, HTN, HLD, OSA  Clinical Impression  Pt pleasant and reports no supplemental O2 use at home. Pt requiring 1L during session to maintain 91% and dropping to 87% on RA. Pt normally goes to the gym 2x/wk and enjoys gardening but fatigued quickly with activity today and unable to make it to the stairs. Pt reports 2 falls in the last month. Pt with decreased activity tolerance and balance who will benefit from acute therapy to maximize mobility, independence and safety.     HR 92-101 BP pre gait 110/83 Post 126/87      Recommendations for follow up therapy are one component of a multi-disciplinary discharge planning process, led by the attending physician.  Recommendations may be updated based on patient status, additional functional criteria and insurance authorization.  Follow Up Recommendations Home health PT    Assistance Recommended at Discharge Intermittent Supervision/Assistance  Patient can return home with the following  A little help with walking and/or transfers;A little help with bathing/dressing/bathroom;Assist for transportation;Help with stairs or ramp for entrance    Equipment Recommendations None recommended by PT  Recommendations for Other Services       Functional Status Assessment Patient has had a recent decline in their functional status and demonstrates the ability to make significant improvements in function in a reasonable and predictable amount of time.     Precautions / Restrictions Precautions Precautions: Fall;Other (comment) Precaution Comments: 5/28 rt radial heart cath      Mobility  Bed Mobility Overal bed mobility: Modified Independent             General  bed mobility comments: increased time without use of RUE to rise to sitting with HOB 15 degrees and no use of rail    Transfers Overall transfer level: Needs assistance   Transfers: Sit to/from Stand Sit to Stand: Supervision           General transfer comment: cues for hand placement and RUE restrictions    Ambulation/Gait Ambulation/Gait assistance: Supervision Gait Distance (Feet): 150 Feet Assistive device: Rolling walker (2 wheels) Gait Pattern/deviations: Step-through pattern, Decreased stride length   Gait velocity interpretation: 1.31 - 2.62 ft/sec, indicative of limited community ambulator   General Gait Details: decreased stride with cues for decreased pressure on RUE, limited by fatigue  Stairs Stairs:  (pt denied attempting this session)          Wheelchair Mobility    Modified Rankin (Stroke Patients Only)       Balance Overall balance assessment: Needs assistance   Sitting balance-Leahy Scale: Good Sitting balance - Comments: able to sit EOB to don prosthesis and sock   Standing balance support: Bilateral upper extremity supported Standing balance-Leahy Scale: Poor Standing balance comment: RW for gait                             Pertinent Vitals/Pain Pain Assessment Pain Assessment: No/denies pain    Home Living Family/patient expects to be discharged to:: Private residence Living Arrangements: Spouse/significant other Available Help at Discharge: Family;Available PRN/intermittently Type of Home: House Home Access: Stairs to enter Entrance Stairs-Rails: Can reach both Entrance Stairs-Number of Steps: 5   Home Layout: One level Home  Equipment: Agricultural consultant (2 wheels);Crutches;Wheelchair - Surveyor, mining (4 wheels);Shower seat;Grab bars - tub/shower      Prior Function Prior Level of Function : Independent/Modified Independent;Driving                     Hand Dominance         Extremity/Trunk Assessment   Upper Extremity Assessment Upper Extremity Assessment: Generalized weakness    Lower Extremity Assessment Lower Extremity Assessment: RLE deficits/detail RLE Deficits / Details: BKA with prosthesis    Cervical / Trunk Assessment Cervical / Trunk Assessment: Normal  Communication      Cognition Arousal/Alertness: Awake/alert Behavior During Therapy: WFL for tasks assessed/performed Overall Cognitive Status: Within Functional Limits for tasks assessed                                          General Comments      Exercises     Assessment/Plan    PT Assessment Patient needs continued PT services  PT Problem List Decreased mobility;Decreased activity tolerance;Decreased balance       PT Treatment Interventions Gait training;Stair training;Functional mobility training;Therapeutic activities;Patient/family education;DME instruction;Therapeutic exercise;Balance training    PT Goals (Current goals can be found in the Care Plan section)  Acute Rehab PT Goals Patient Stated Goal: return to gardening and the gym PT Goal Formulation: With patient Time For Goal Achievement: 07/31/21 Potential to Achieve Goals: Good    Frequency Min 3X/week     Co-evaluation               AM-PAC PT "6 Clicks" Mobility  Outcome Measure Help needed turning from your back to your side while in a flat bed without using bedrails?: A Little Help needed moving from lying on your back to sitting on the side of a flat bed without using bedrails?: A Little Help needed moving to and from a bed to a chair (including a wheelchair)?: A Little Help needed standing up from a chair using your arms (e.g., wheelchair or bedside chair)?: A Little Help needed to walk in hospital room?: A Little Help needed climbing 3-5 steps with a railing? : A Little 6 Click Score: 18    End of Session Equipment Utilized During Treatment: Gait belt Activity Tolerance:  Patient tolerated treatment well Patient left: in chair;with call bell/phone within reach;with chair alarm set Nurse Communication: Mobility status PT Visit Diagnosis: Other abnormalities of gait and mobility (R26.89);Difficulty in walking, not elsewhere classified (R26.2)    Time: 8366-2947 PT Time Calculation (min) (ACUTE ONLY): 29 min   Charges:   PT Evaluation $PT Eval Moderate Complexity: 1 Mod PT Treatments $Gait Training: 8-22 mins        Shanesha Bednarz P, PT Acute Rehabilitation Services Pager: [phone] Office: [phone]   Enedina Finner Kerrick Miler 07/17/2021, 8:53 AM

## 2021-07-17 NOTE — Progress Notes (Addendum)
Subjective:   Hospital day: 1  Overnight event: Heart cath   Interim History: Patient evaluated at bedside sitting comfortably in bed.  States he just worked with PT and has some shortness of breath with ambulation but denies any chest pain.  No shortness of breath at rest.  Discussed getting his blood sugar, cholesterol and blood pressure under control.  Objective:  Vital signs in last 24 hours: Vitals:   07/17/21 0746 07/17/21 0806 07/17/21 0853 07/17/21 1128  BP:  126/87 126/87 120/78  Pulse: 92 98 95 95  Resp:    20  Temp:    98 F (36.7 C)  TempSrc:    Oral  SpO2:  91%  90%  Weight:      Height:        Filed Weights   07/17/21 0022  Weight: 101.6 kg     Intake/Output Summary (Last 24 hours) at 07/17/2021 1203 Last data filed at 07/17/2021 0900 Gross per 24 hour  Intake 1132.5 ml  Output 2200 ml  Net -1067.5 ml   Net IO Since Admission: -1,067.5 mL [07/17/21 1203]  Recent Labs    07/17/21 0442 07/17/21 0729 07/17/21 1127  GLUCAP 341* 277* 268*     Pertinent Labs:    Latest Ref Rng & Units 07/17/2021    1:12 AM 07/16/2021    2:31 PM 07/16/2021   10:37 AM  CBC  WBC 4.0 - 10.5 K/uL 8.9    8.5    Hemoglobin 13.0 - 17.0 g/dL 62.9   47.6   54.6    Hematocrit 39.0 - 52.0 % 45.9   50.0   49.6    Platelets 150 - 400 K/uL PLATELET CLUMPS NOTED ON SMEAR, UNABLE TO ESTIMATE    166         Latest Ref Rng & Units 07/17/2021    1:12 AM 07/16/2021    2:31 PM 07/16/2021    1:46 PM  CMP  Glucose 70 - 99 mg/dL 503    546    BUN 8 - 23 mg/dL 9    6    Creatinine 5.68 - 1.24 mg/dL 1.27    5.17    Sodium 135 - 145 mmol/L 136   136   136    Potassium 3.5 - 5.1 mmol/L 4.1   6.8   4.0    Chloride 98 - 111 mmol/L 106    104    CO2 22 - 32 mmol/L 24    22    Calcium 8.9 - 10.3 mg/dL 8.8    8.8      Imaging: CARDIAC CATHETERIZATION  Result Date: 07/16/2021   Mid RCA lesion is 20% stenosed.   Prox RCA lesion is 95% stenosed.   Prox Cx lesion is 40% stenosed.   Mid  LAD lesion is 50% stenosed.   A stent was successfully placed.   Post intervention, there is a 0% residual stenosis.   LV end diastolic pressure is mildly elevated. 1.  High-grade calcified proximal right coronary artery lesion treated with atherectomy and 1 drug-eluting stent with patent previously placed mid to distal right coronary artery stent and proximal left circumflex stent, the latter with moderate in-stent restenosis. 2.  Very focal mid moderate LAD lesion. 3.  LVEDP of 19 mmHg. Recommendation: Dual antiplatelet therapy for 1 year.   ECHOCARDIOGRAM LIMITED  Result Date: 07/16/2021    ECHOCARDIOGRAM LIMITED REPORT   Patient Name:   Ricardo Howard Date of Exam: 07/16/2021 Medical Rec #:  [id number]         Height:       68.0 in Accession #:    [id number]        Weight:       265.0 lb Date of Birth:  [birth year]          BSA:          2.303 m Patient Age:    61 years          BP:           159/106 mmHg Patient Gender: M                 HR:           116 bpm. Exam Location:  Inpatient Procedure: Limited Echo Indications:    Acute MI  History:        Patient has prior history of Echocardiogram examinations, most                 recent 05/31/2021. Risk Factors:Hypertension, Diabetes,                 Dyslipidemia and Current Smoker. ETOH Abuse\.  Sonographer:    Ricardo Howard Referring Phys: [phone] Ricardo Howard IMPRESSIONS  1. Septal hypokinesis mid/basal wall akinesis . Left ventricular ejection fraction, by estimation, is 45 to 50%. The left ventricle has mildly decreased function. The left ventricle demonstrates regional wall motion abnormalities (see scoring diagram/findings for description). The left ventricular internal cavity size was mildly dilated. There is mild left ventricular hypertrophy.  2. Right ventricular systolic function is normal. The right ventricular size is normal.  3. The mitral valve is normal in structure. No evidence of mitral valve regurgitation.  4. The aortic valve is tricuspid.  There is moderate calcification of the aortic valve. There is mild thickening of the aortic valve. Aortic valve sclerosis/calcification is present, without any evidence of aortic stenosis. FINDINGS  Left Ventricle: Septal hypokinesis mid/basal wall akinesis. Left ventricular ejection fraction, by estimation, is 45 to 50%. The left ventricle has mildly decreased function. The left ventricle demonstrates regional wall motion abnormalities. The left ventricular internal cavity size was mildly dilated. There is mild left ventricular hypertrophy. Right Ventricle: The right ventricular size is normal. Right vetricular wall thickness was not assessed. Right ventricular systolic function is normal. Pericardium: There is no evidence of pericardial effusion. Mitral Valve: The mitral valve is normal in structure. There is mild thickening of the mitral valve leaflet(s). Aortic Valve: The aortic valve is tricuspid. There is moderate calcification of the aortic valve. There is mild thickening of the aortic valve. Aortic valve sclerosis/calcification is present, without any evidence of aortic stenosis. LEFT VENTRICLE PLAX 2D LVIDd:         5.00 cm LVIDs:         3.20 cm LV PW:         1.00 cm LV IVS:        1.00 cm LVOT diam:     2.10 cm LVOT Area:     3.46 cm  LEFT ATRIUM         Index LA diam:    2.90 cm 1.26 cm/m   AORTA Ao Root diam: 3.30 cm  SHUNTS Systemic Diam: 2.10 cm Ricardo Haws MD Electronically signed by Ricardo Haws MD Signature Date/Time: 07/16/2021/2:30:27 PM    Final     Physical Exam  General: Pleasant, well-appearing elderly man sitting in chair. No acute distress. CV: RRR. No m/r/g.  Trace LLE edema Pulmonary: Lungs CTAB. Normal effort. Mild bibasilar rales Abdominal: Soft, nontender, nondistended. Normal bowel sounds. Extremities: R BKA with prosthesis Skin: Warm and dry. No obvious rash or lesions. Neuro: A&Ox3. Moves all extremities. Normal sensation to gross touch.  Psych: Normal mood and  affect   Assessment/Plan: Ricardo Howard is a 61 y.o. male with hx of  uncontrolled T2DM,  CHF (LVEF 40-45%), HTN, HLD, severe OSA, PAD s/p R BKA in 02/2020 and multiple LLE stents, alcohol use disorder, recurrent pancreatitis, and recent hospitalization for sepsis 2/2 CAP and DKA who is presenting with acute chest pain, admitted for NSTEMI.  Principal Problem:   NSTEMI (non-ST elevated myocardial infarction) (HCC) Active Problems:   Alcohol abuse   Diabetes mellitus with diabetic neuropathy, with long-term current use of insulin (HCC)   Hyperkalemia  #N-STEMI  #CHF Patient underwent emergent left heart cath yesterday that showed high-grade stenosis in proximal to mid RCA treated with atherectomy and long DES. Patient was found to have residual moderate disease in proximal LAD and LCx. TTE on 5/28 showed EF 45-50%, septal hypokinesis mid/basal wall akinesis, and mildly dilated LV internal cavity.  We will work on risk factor modification maximize guideline directed medical therapy.  -Cardiology following, appreciate recs -Continue Coreg 12.5 mg BID , Imdur 60 mg daily, Jardiance 25 mg daily -Continue Lasix 20 mg daily -Start Losartan 12.5 mg daily  #T2DM  #Neuropathy  Poorly controlled, HgbA1c 10.9. Reports current home regimen is Jardiance 12.5 mg daily, Novolog 30U BID, Lantus 35U BID, and Novolog 20U + SSI TID with meals. Patient's blood sugar elevated to 511 overnight after receiving steroids during the day. Received extra dose of short acting overnight. CBGs remains elevated in the 200s to 300s. - Increase Semglee to 20 units BID - Continue NovoLog 4 units 3 times daily with meals - Continue SSI with night coverage - Increase Jardiance to 25 mg daily - Continue gabapentin   #HTN Patient with a history of hypotension during prior hospitalization. SBP has been in the 110s to 140s since admission. We will add low-dose losartan as above and monitor closely. - Continue Coreg and  Imdur as above - Add low dose losartan as above   #PAD #s/p R BKA  #Chronic LLE limb threatening ischemia  #Tobacco use Lipid panel with LDL 193. Home regimen includes Aspirin 81 mg daily, atorvastatin 80 mg qhs, and clopidogrel 75 mg daily. Patient currently smokes about 4 cigarettes daily, which is a significant decrease from 1 pack per day.  Refused nicotine patch. Patient counseled on importance of smoking cessation. Will add Zetia to help improve LDL.  - Continue ASA 81 mg daily and atorvastatin 80 mg daily - Discontinue Plavix, start Brilinta 90 mg BID per cardiology recs - Start Zetia 10 mg daily  #Alcohol abuse #Chronic pancreatitis  Reports drinking 12-pack of beer daily for the past 3 months. Denies severe withdrawal or history of withdrawal seizures. Lipase wnl. Takes pancreatic enzyme replacement therapy Creon capsule TID before meals. CT abdomen from 4/12 with multiple calcifications of pancreatic head.  - Continue Creon 36000 unit capsule TID before meals  - CIWA w/out Ativan  #Severe OSA Sleep study on 03/17/21 with severe OSA. Per RRT, patient states he does not wear CPAP at home at this time but previously used it several years ago. Patient agreeable to having CPAP at night. -Continue CPAP at night   #Depression #Anxiety #PTSD Chronic and stable.   -Continue home Wellbutrin 150 mg BID,  Latuda 40 mg qhs, and Zoloft 100 mg tablet daily.  #CXR w/ RLL opacity  #Concern for underlying neoplasm  CT chest from 4/12 with dense right middle lobe consolidation and additional foci of  consolidation and nodularity in the right lower lobe, suspected to be      secondary to respiratory infection/pneumonia but    recommended follow-up to resolution to exclude underlying   neoplasm. CXR today with continued RLL opacity but no clinical findings   of pneumonia. -Patient will need outpatient CT chest   #Constipation: Continue Miralax daily PRN #GERD: Continue home Protonix 40 mg  daily.  #Hypokalemia: Resolved.  K+ of 4.1.    Diet: NPO for procedure, start heart healthy diet this evening IVF: None DVT ppx: Lovenox Code: Full   Prior to Admission Living Arrangement: Home Anticipated Discharge Location: Home with home PT Barriers to Discharge: Medical stability Dispo: Anticipated discharge tomorrow  Signed: Steffanie Rainwater, MD 07/17/2021, 12:03 PM  Pager: [phone] Internal Medicine Teaching Service After 5pm on weekdays and 1pm on weekends: On Call pager: [phone]

## 2021-07-17 NOTE — Plan of Care (Signed)
  Problem: Education: Goal: Knowledge of General Education information will improve Description: Including pain rating scale, medication(s)/side effects and non-pharmacologic comfort measures Outcome: Progressing   Problem: Health Behavior/Discharge Planning: Goal: Ability to manage health-related needs will improve Outcome: Progressing   Problem: Clinical Measurements: Goal: Ability to maintain clinical measurements within normal limits will improve Outcome: Progressing Goal: Diagnostic test results will improve Outcome: Progressing   Problem: Nutrition: Goal: Adequate nutrition will be maintained Outcome: Completed/Met   Problem: Coping: Goal: Level of anxiety will decrease Outcome: Completed/Met   Problem: Elimination: Goal: Will not experience complications related to bowel motility Outcome: Completed/Met Goal: Will not experience complications related to urinary retention Outcome: Completed/Met   Problem: Pain Managment: Goal: General experience of comfort will improve Outcome: Completed/Met

## 2021-07-17 NOTE — Progress Notes (Addendum)
Mobility Specialist Progress Note    07/17/21 1050  Mobility  Activity Ambulated with assistance in hallway  Level of Assistance Contact guard assist, steadying assist  Assistive Device Front wheel walker  RUE Weight Bearing WBAT  Distance Ambulated (ft) 200 ft  Activity Response Tolerated fair  $Mobility charge 1 Mobility   Pre-Mobility: 96 HR, 96% on RA SpO2 During Mobility: 91% on 1L SpO2 Post-Mobility: 98 HR, 93% on 2L SpO2  Pt received in bed and agreeable. SpO2 dropped to 86% at start of activity. Able to maintain >/=91% on 1LO2. Returned to sitting EOB on 2Lo2 in room with call bell in reach.   Hildred Alamin Mobility Specialist  Primary: 5N M.S. Phone: [phone] Secondary: 6N M.S. Phone: [phone]

## 2021-07-17 NOTE — Progress Notes (Signed)
SATURATION QUALIFICATIONS: (This note is used to comply with regulatory documentation for home oxygen)  Patient Saturations on Room Air at Rest = 96%  Patient Saturations on Room Air while Ambulating = 92%  Patient Saturations on 2 Liters of oxygen while bed level = 95%  Please briefly explain why patient needs home oxygen:   Ricardo Howard, OTR/L  Acute Rehabilitation Services Office: [phone] .

## 2021-07-17 NOTE — Progress Notes (Signed)
Cpap available RT took to room but pt. Decided he doesn't want to wear it because he doesn't wear it at home and oxygen would be fine at this time.

## 2021-07-18 ENCOUNTER — Encounter (HOSPITAL_COMMUNITY): Payer: Self-pay | Admitting: Internal Medicine

## 2021-07-18 ENCOUNTER — Other Ambulatory Visit (HOSPITAL_COMMUNITY): Payer: Self-pay

## 2021-07-18 DIAGNOSIS — I5020 Unspecified systolic (congestive) heart failure: Secondary | ICD-10-CM

## 2021-07-18 DIAGNOSIS — I110 Hypertensive heart disease with heart failure: Secondary | ICD-10-CM

## 2021-07-18 DIAGNOSIS — I214 Non-ST elevation (NSTEMI) myocardial infarction: Secondary | ICD-10-CM | POA: Diagnosis not present

## 2021-07-18 DIAGNOSIS — E1140 Type 2 diabetes mellitus with diabetic neuropathy, unspecified: Secondary | ICD-10-CM

## 2021-07-18 DIAGNOSIS — Z794 Long term (current) use of insulin: Secondary | ICD-10-CM

## 2021-07-18 LAB — BASIC METABOLIC PANEL
Anion gap: 7 (ref 5–15)
BUN: 14 mg/dL (ref 8–23)
CO2: 26 mmol/L (ref 22–32)
Calcium: 8.8 mg/dL — ABNORMAL LOW (ref 8.9–10.3)
Chloride: 104 mmol/L (ref 98–111)
Creatinine, Ser: 0.85 mg/dL (ref 0.61–1.24)
GFR, Estimated: >60 mL/min (ref >60)
Glucose, Bld: 123 mg/dL — ABNORMAL HIGH (ref 70–99)
Potassium: 3.3 mmol/L — ABNORMAL LOW (ref 3.5–5.1)
Sodium: 137 mmol/L (ref 135–145)

## 2021-07-18 LAB — CBC
HCT: 40.7 % (ref 39.0–52.0)
Hemoglobin: 13.5 g/dL (ref 13.0–17.0)
MCH: 29.1 pg (ref 26.0–34.0)
MCHC: 33.2 g/dL (ref 30.0–36.0)
MCV: 87.7 fL (ref 80.0–100.0)
Platelets: 116 10*3/uL — ABNORMAL LOW (ref 150–400)
RBC: 4.64 MIL/uL (ref 4.22–5.81)
RDW: 20.2 % — ABNORMAL HIGH (ref 11.5–15.5)
WBC: 8.5 10*3/uL (ref 4.0–10.5)
nRBC: 0 % (ref 0.0–0.2)

## 2021-07-18 LAB — GLUCOSE, CAPILLARY
Glucose-Capillary: 147 mg/dL — ABNORMAL HIGH (ref 70–99)
Glucose-Capillary: 234 mg/dL — ABNORMAL HIGH (ref 70–99)
Glucose-Capillary: 120 mg/dL — ABNORMAL HIGH (ref 70–99)
Glucose-Capillary: 173 mg/dL — ABNORMAL HIGH (ref 70–99)

## 2021-07-18 LAB — MAGNESIUM: Magnesium: 2 mg/dL (ref 1.7–2.4)

## 2021-07-18 MED ORDER — INSULIN ASPART 100 UNIT/ML IJ SOLN
10.0000 [IU] | Freq: Three times a day (TID) | INTRAMUSCULAR | Status: DC
  Administered 2021-07-19 (×2): 10 [IU] via SUBCUTANEOUS

## 2021-07-18 MED ORDER — POTASSIUM CHLORIDE CRYS ER 20 MEQ PO TBCR
20.0000 meq | EXTENDED_RELEASE_TABLET | Freq: Once | ORAL | Status: AC
  Administered 2021-07-18: 20 meq via ORAL
  Filled 2021-07-18: qty 1

## 2021-07-18 NOTE — Research (Addendum)
Spoke with patient about [employer] research study.  Repatha vs standard of care.  Answered all questions for him and his wife.  Patient is reviewing consent and will go back up in a few.  Mercer Pod :) RN BSN  Clinical Research Nurse Be strong and take heart, all you who hope in the Lord. ~ Psalm 31:24     07/18/2021 @ 1500 Spoke with patient and wife about study.  They [facility] before signing up for [facility].  I told him I would call him this week around Friday.  He has 10 days from admission to sign up for research study.  Patient and wife agreed.

## 2021-07-18 NOTE — Progress Notes (Signed)
Pt ambulated with PT. Discussed with pt and wife MI, stent, Brilinta importance, restrictions, smoking cessation, diet, exercise, NTG and CRPII. Pt receptive, working on quitting smoking, gave resources and tips. Will refer to [place] CRPII.  [id number] Ethelda Chick CES, ACSM 07/18/2021 11:06 AM

## 2021-07-18 NOTE — TOC Benefit Eligibility Note (Signed)
Patient Teacher, English as a foreign language completed.    The patient is currently admitted and upon discharge could be taking Brilinta 90 mg.  The current 30 day co-pay is, $10.35.   The patient is currently admitted and upon discharge could be taking Jardiance 25 mg.  The current 30 day co-pay is, $10.35.   The patient is currently admitted and upon discharge could be taking Entresto 24-26 mg.  The current 30 day co-pay is, $10.35.   The patient is insured through [place], [place] Patient Advocate Specialist [facility] Patient Advocate Team Direct Number: [phone]  Fax: [phone]

## 2021-07-18 NOTE — Progress Notes (Addendum)
Progress Note  Patient Name: Ricardo Howard Date of Encounter: 07/18/2021  [facility] Cardiologist: None   Subjective   Feeling well this morning. No complaints. Wife at the bedside.   Inpatient Medications    Scheduled Meds:  aspirin  81 mg Oral Daily   atorvastatin  80 mg Oral QHS   buPROPion ER  150 mg Oral BID   carvedilol  12.5 mg Oral BID WC   empagliflozin  25 mg Oral Daily   enoxaparin (LOVENOX) injection  50 mg Subcutaneous Q24H   ezetimibe  10 mg Oral Daily   furosemide  20 mg Oral Daily   gabapentin  300 mg Oral BID   gabapentin  600 mg Oral QHS   insulin aspart  0-15 Units Subcutaneous TID WC   insulin aspart  0-5 Units Subcutaneous QHS   insulin aspart  4 Units Subcutaneous TID WC   insulin glargine-yfgn  20 Units Subcutaneous BID   isosorbide mononitrate  60 mg Oral Daily   lipase/protease/amylase  36,000 Units Oral TID AC   losartan  12.5 mg Oral Daily   lurasidone  40 mg Oral QHS   pantoprazole  40 mg Oral Daily   sertraline  100 mg Oral Daily   ticagrelor  90 mg Oral BID   Continuous Infusions:  sodium chloride     PRN Meds: sodium chloride, acetaminophen, nitroGLYCERIN, ondansetron (ZOFRAN) IV, polyethylene glycol   Vital Signs    Vitals:   07/17/21 2013 07/18/21 0657 07/18/21 0717 07/18/21 0756  BP: 116/74 116/78    Pulse: 98 91 (!) 0 (!) 102  Resp: 18 18    Temp: 98.1 F (36.7 C) 98.6 F (37 C)    TempSrc: Oral Oral    SpO2: 96% 98%  92%  Weight:      Height:        Intake/Output Summary (Last 24 hours) at 07/18/2021 0906 Last data filed at 07/18/2021 0657 Gross per 24 hour  Intake 600 ml  Output 2300 ml  Net -1700 ml      07/17/2021   12:22 AM 05/30/2021    8:07 PM 05/02/2021    1:08 PM  Last 3 Weights  Weight (lbs) 223 lb 15.8 oz 265 lb 240 lb  Weight (kg) 101.6 kg 120.203 kg 108.863 kg      Telemetry    Sinus Rhythm - Personally Reviewed  ECG    No new tracing  Physical Exam   GEN: No acute distress.    Neck: No JVD Cardiac: RRR, no murmurs, rubs, or gallops.  Respiratory: Clear to auscultation bilaterally. GI: Soft, nontender, non-distended  MS: No edema; No deformity. Right radial cath site stable. Right BKA Neuro:  Nonfocal  Psych: Normal affect   Labs    High Sensitivity Troponin:   Recent Labs  Lab 07/16/21 1037 07/16/21 1237  TROPONINIHS 2,174* 2,022*     Chemistry Recent Labs  Lab 07/16/21 1037 07/16/21 1346 07/16/21 1431 07/17/21 0112 07/18/21 0555  NA 135 136 136 136 137  K 6.1* 4.0 6.8* 4.1 3.3*  CL 101 104  --  106 104  CO2 23 22  --  24 26  GLUCOSE 286* 297*  --  344* 123*  BUN 11 6*  --  9 14  CREATININE 0.76 0.68  --  0.75 0.85  CALCIUM 9.1 8.8*  --  8.8* 8.8*  MG  --  1.8  --   --  2.0  PROT RESULTS UNAVAILABLE DUE TO INTERFERING  SUBSTANCE  --   --   --   --   ALBUMIN 3.7  --   --   --   --   AST 79*  --   --   --   --   ALT 42  --   --   --   --   ALKPHOS 87  --   --   --   --   BILITOT 0.7  --   --   --   --   GFRNONAA >60 >60  --  >60 >60  ANIONGAP 11 10  --  6 7    Lipids  Recent Labs  Lab 07/16/21 1448  CHOL 249*  TRIG 58  HDL 44  LDLCALC 193*  CHOLHDL 5.7    Hematology Recent Labs  Lab 07/16/21 1037 07/16/21 1431 07/17/21 0112 07/18/21 0555  WBC 8.5  --  8.9 8.5  RBC 5.61  --  5.19 4.64  HGB 16.2 17.0 15.0 13.5  HCT 49.6 50.0 45.9 40.7  MCV 88.4  --  88.4 87.7  MCH 28.9  --  28.9 29.1  MCHC 32.7  --  32.7 33.2  RDW 20.6*  --  20.3* 20.2*  PLT 166  --  PLATELET CLUMPS NOTED ON SMEAR, UNABLE TO ESTIMATE 116*   Thyroid  Recent Labs  Lab 07/16/21 1415  TSH 0.580    BNP Recent Labs  Lab 07/16/21 1037  BNP 56.4    DDimer  Recent Labs  Lab 07/16/21 1037  DDIMER 0.40     Radiology    CARDIAC CATHETERIZATION  Result Date: 07/16/2021   Mid RCA lesion is 20% stenosed.   Prox RCA lesion is 95% stenosed.   Prox Cx lesion is 40% stenosed.   Mid LAD lesion is 50% stenosed.   A stent was successfully placed.   Post  intervention, there is a 0% residual stenosis.   LV end diastolic pressure is mildly elevated. 1.  High-grade calcified proximal right coronary artery lesion treated with atherectomy and 1 drug-eluting stent with patent previously placed mid to distal right coronary artery stent and proximal left circumflex stent, the latter with moderate in-stent restenosis. 2.  Very focal mid moderate LAD lesion. 3.  LVEDP of 19 mmHg. Recommendation: Dual antiplatelet therapy for 1 year.   DG Chest Port 1 View  Result Date: 07/16/2021 CLINICAL DATA:  Chest pain. EXAM: PORTABLE CHEST 1 VIEW COMPARISON:  05/30/2021 chest radiograph, 05/31/2021 CT and prior studies FINDINGS: The cardiomediastinal silhouette is unchanged. Mild pulmonary vascular congestion is noted. Continued RIGHT LOWER lung opacity and mild LEFT basilar atelectasis again noted. No pneumothorax or large pleural effusion identified. No acute bony abnormalities present IMPRESSION: 1. Unchanged RIGHT LOWER lung opacity which may represent atelectasis or airspace disease/pneumonia. 2. Unchanged mild LEFT basilar atelectasis. 3. Mild pulmonary vascular congestion. Electronically Signed   By: Harmon PierJeffrey  Hu M.D.   On: 07/16/2021 11:22   ECHOCARDIOGRAM LIMITED  Result Date: 07/16/2021    ECHOCARDIOGRAM LIMITED REPORT   Patient Name:   Ricardo DrainLEE ALLEN Howard Date of Exam: 07/16/2021 Medical Rec #:  [id number]         Height:       68.0 in Accession #:    [id number]        Weight:       265.0 lb Date of Birth:  12/29/1960          BSA:          2.303 m Patient Age:  61 years          BP:           159/106 mmHg Patient Gender: M                 HR:           116 bpm. Exam Location:  Inpatient Procedure: Limited Echo Indications:    Acute MI  History:        Patient has prior history of Echocardiogram examinations, most                 recent 05/31/2021. Risk Factors:Hypertension, Diabetes,                 Dyslipidemia and Current Smoker. ETOH Abuse\.  Sonographer:    Mikki Harbor Referring Phys: [id number] Wendall Stade IMPRESSIONS  1. Septal hypokinesis mid/basal wall akinesis . Left ventricular ejection fraction, by estimation, is 45 to 50%. The left ventricle has mildly decreased function. The left ventricle demonstrates regional wall motion abnormalities (see scoring diagram/findings for description). The left ventricular internal cavity size was mildly dilated. There is mild left ventricular hypertrophy.  2. Right ventricular systolic function is normal. The right ventricular size is normal.  3. The mitral valve is normal in structure. No evidence of mitral valve regurgitation.  4. The aortic valve is tricuspid. There is moderate calcification of the aortic valve. There is mild thickening of the aortic valve. Aortic valve sclerosis/calcification is present, without any evidence of aortic stenosis. FINDINGS  Left Ventricle: Septal hypokinesis mid/basal wall akinesis. Left ventricular ejection fraction, by estimation, is 45 to 50%. The left ventricle has mildly decreased function. The left ventricle demonstrates regional wall motion abnormalities. The left ventricular internal cavity size was mildly dilated. There is mild left ventricular hypertrophy. Right Ventricle: The right ventricular size is normal. Right vetricular wall thickness was not assessed. Right ventricular systolic function is normal. Pericardium: There is no evidence of pericardial effusion. Mitral Valve: The mitral valve is normal in structure. There is mild thickening of the mitral valve leaflet(s). Aortic Valve: The aortic valve is tricuspid. There is moderate calcification of the aortic valve. There is mild thickening of the aortic valve. Aortic valve sclerosis/calcification is present, without any evidence of aortic stenosis. LEFT VENTRICLE PLAX 2D LVIDd:         5.00 cm LVIDs:         3.20 cm LV PW:         1.00 cm LV IVS:        1.00 cm LVOT diam:     2.10 cm LVOT Area:     3.46 cm  LEFT ATRIUM         Index  LA diam:    2.90 cm 1.26 cm/m   AORTA Ao Root diam: 3.30 cm  SHUNTS Systemic Diam: 2.10 cm Charlton Haws MD Electronically signed by Charlton Haws MD Signature Date/Time: 07/16/2021/2:30:27 PM    Final     Cardiac Studies   Echo 07/16/21:   1. Septal hypokinesis mid/basal wall akinesis . Left ventricular ejection  fraction, by estimation, is 45 to 50%. The left ventricle has mildly  decreased function. The left ventricle demonstrates regional wall motion  abnormalities (see scoring  diagram/findings for description). The left ventricular internal cavity  size was mildly dilated. There is mild left ventricular hypertrophy.   2. Right ventricular systolic function is normal. The right ventricular  size is normal.   3. The mitral valve is  normal in structure. No evidence of mitral valve  regurgitation.   4. The aortic valve is tricuspid. There is moderate calcification of the  aortic valve. There is mild thickening of the aortic valve. Aortic valve  sclerosis/calcification is present, without any evidence of aortic  stenosis.    Cardiac cath 07/16/21:     Mid RCA lesion is 20% stenosed.   Prox RCA lesion is 95% stenosed.   Prox Cx lesion is 40% stenosed.   Mid LAD lesion is 50% stenosed.   A stent was successfully placed.   Post intervention, there is a 0% residual stenosis.   LV end diastolic pressure is mildly elevated.   1.  High-grade calcified proximal right coronary artery lesion treated with atherectomy and 1 drug-eluting stent with patent previously placed mid to distal right coronary artery stent and proximal left circumflex stent, the latter with moderate in-stent restenosis. 2.  Very focal mid moderate LAD lesion. 3.  LVEDP of 19 mmHg.   Recommendation: Dual antiplatelet therapy for 1 year.   Dominance: Right Intervention      Patient Profile     61 y.o. male with poorly controlled DM, HTN, HLD and known CAD s/p stenting of the proximal LCX and distal RCA presents  with chest pain and NSTEMI  Assessment & Plan    NSTEMI: hsTn peaked at 2174. Initial EKG was non-diagnostic. Continued with on-going chest pain and underwent emergent cardiac cath. Successful PCI/DES to p/mRCA with atherectomy with long stent. Placed on DAPT with Brilinta/ASA indefinitely give the length of stent and number. Does have moderate disease in the pLAD and LCx to be treated medically. No recurrent chest pain. CR to see.  -- continue ASA, Brilinta, statin, coreg, losartan   HTN: stable -- continue coreg 12.5mg  BID, losartan 12.5mg  daily, Imdur 60mg  daily   HFmrEF/ICM: LVEF of 45-50% with septal hypokinesis mid/basal wall akinesis -- no significant volume overload on exam -- on coreg, losartan  DM: Hgb 10.9 -- on SSI  -- continue jardiance   HLD: LDL 193 -- on atorvastatin 80mg  daily -- referral to lipid clinic at discharge   PAD: s/p right BKA  Hypokalemia: K+ 3.3 -- supplement  Tobacco/ETOH use: cessation advised   OSA: on Cpap  Will arrange for outpatient follow up in the office. He follows through the as well.   For questions or updates, please contact [facility] Please consult [email or web address] for contact info under        Signed, , NP  07/18/2021, 9:06 AM     ATTENDING ATTESTATION:  After conducting a review of all available clinical information with the care team, interviewing the patient, and performing a physical exam, I agree with the findings and plan described in this note.   GEN: No acute distress.   HEENT:  MMM, no JVD, no scleral icterus Cardiac: RRR, no murmurs, rubs, or gallops.  Respiratory: Clear to auscultation bilaterally. GI: Soft, nontender, non-distended  MS: No edema; No deformity. S/p R BKA Neuro:  Nonfocal  Vasc:  R radial dressing in place  Patient is doing well after PCI due to ACS.  Cont DAPT x 1 year, high dose atorvastatin, losartan, Coreg, and Jardiance.  Looks euvolemic on exam and his chest pain  free.  Will arrange cardiology follow-up.  Will sign off, call with ?s.  Laverda Page, MD Pager [phone]

## 2021-07-18 NOTE — Progress Notes (Signed)
Physical Therapy Treatment Patient Details Name: Ricardo Howard MRN: [id number] DOB: 01-23-1961 Today's Date: 07/18/2021   History of Present Illness 61 yo male admitted 5/28 with chest pain, NSTEMI s/p cardiac cath 5/28. PMhx: PAD s/p Rt BKA, ETOH use disorder, pancreatitis, T2DM, CHF, HTN, HLD, OSA    PT Comments    PT with slowly progressing activity tolerance and able to complete stairs without physical assist. Cues for remainders of RUE restrictions and need for supplemental oxygen with gait. D/C plan remains appropriate with wife present and agreeable. Encouraged daily mobility and HEP.   92% on RA at rest, 85% on RA with gait, 91% on 1L with gait HR 102 with activity    Recommendations for follow up therapy are one component of a multi-disciplinary discharge planning process, led by the attending physician.  Recommendations may be updated based on patient status, additional functional criteria and insurance authorization.  Follow Up Recommendations  Home health PT     Assistance Recommended at Discharge Intermittent Supervision/Assistance  Patient can return home with the following A little help with walking and/or transfers;A little help with bathing/dressing/bathroom;Assist for transportation;Help with stairs or ramp for entrance   Equipment Recommendations  None recommended by PT    Recommendations for Other Services       Precautions / Restrictions Precautions Precautions: Fall Precaution Comments: 5/28 rt radial heart cath, Rt BKA     Mobility  Bed Mobility Overal bed mobility: Modified Independent             General bed mobility comments: increased time without use of RUE to rise to sitting with HOB 15 degrees and use of rail, cues for sequence and increased struggle    Transfers Overall transfer level: Needs assistance   Transfers: Sit to/from Stand Sit to Stand: Supervision           General transfer comment: cues for hand placement and RUE  restrictions    Ambulation/Gait Ambulation/Gait assistance: Supervision Gait Distance (Feet): 250 Feet Assistive device: Rolling walker (2 wheels) Gait Pattern/deviations: Step-through pattern, Decreased stride length   Gait velocity interpretation: 1.31 - 2.62 ft/sec, indicative of limited community ambulator   General Gait Details: decreased stride with cues for breathing technique with 2 standing rest breaks. Pt with desaturation to 85% on RA with long hall ambulation and maintained 91% on 1L with return gait   Stairs Stairs: Yes Stairs assistance: Supervision Stair Management: Step to pattern, Forwards, Two rails Number of Stairs: 5 General stair comments: pt using mainly LUE support for stairs with good stability   Wheelchair Mobility    Modified Rankin (Stroke Patients Only)       Balance Overall balance assessment: Needs assistance   Sitting balance-Leahy Scale: Good Sitting balance - Comments: able to sit EOB to don prosthesis   Standing balance support: Bilateral upper extremity supported Standing balance-Leahy Scale: Fair Standing balance comment: RW for gait                            Cognition Arousal/Alertness: Awake/alert Behavior During Therapy: WFL for tasks assessed/performed Overall Cognitive Status: Within Functional Limits for tasks assessed                                          Exercises General Exercises - Lower Extremity Long Arc Quad: AROM, Seated, Both, 20 reps  Hip Flexion/Marching: AROM, Both, Seated, 20 reps    General Comments        Pertinent Vitals/Pain Pain Assessment Pain Assessment: 0-10 Pain Score: 3  Pain Location: chest pain with deep breaths Pain Descriptors / Indicators: Aching Pain Intervention(s): Limited activity within patient's tolerance, Monitored during session    Home Living                          Prior Function            PT Goals (current goals can now be  found in the care plan section) Progress towards PT goals: Progressing toward goals    Frequency    Min 3X/week      PT Plan Current plan remains appropriate    Co-evaluation              AM-PAC PT "6 Clicks" Mobility   Outcome Measure  Help needed turning from your back to your side while in a flat bed without using bedrails?: A Little Help needed moving from lying on your back to sitting on the side of a flat bed without using bedrails?: A Little Help needed moving to and from a bed to a chair (including a wheelchair)?: A Little Help needed standing up from a chair using your arms (e.g., wheelchair or bedside chair)?: A Little Help needed to walk in hospital room?: A Little Help needed climbing 3-5 steps with a railing? : None 6 Click Score: 19    End of Session Equipment Utilized During Treatment: Gait belt Activity Tolerance: Patient tolerated treatment well Patient left: in chair;with call bell/phone within reach;with chair alarm set;with family/visitor present Nurse Communication: Mobility status PT Visit Diagnosis: Other abnormalities of gait and mobility (R26.89);Difficulty in walking, not elsewhere classified (R26.2)     Time: 0720-0753 PT Time Calculation (min) (ACUTE ONLY): 33 min  Charges:  $Gait Training: 8-22 mins $Therapeutic Exercise: 8-22 mins                     Reeshemah Nazaryan P, PT Acute Rehabilitation Services Pager: [phone] Office: [phone]    Topanga Alvelo B Tanaya Dunigan 07/18/2021, 8:00 AM

## 2021-07-18 NOTE — Plan of Care (Signed)
  Problem: Education: Goal: Knowledge of General Education information will improve Description: Including pain rating scale, medication(s)/side effects and non-pharmacologic comfort measures Outcome: Progressing   Problem: Health Behavior/Discharge Planning: Goal: Ability to manage health-related needs will improve Outcome: Progressing   Problem: Clinical Measurements: Goal: Ability to maintain clinical measurements within normal limits will improve Outcome: Progressing Goal: Will remain free from infection Outcome: Progressing Goal: Diagnostic test results will improve Outcome: Progressing Goal: Respiratory complications will improve Outcome: Progressing Goal: Cardiovascular complication will be avoided Outcome: Progressing   Problem: Activity: Goal: Risk for activity intolerance will decrease Outcome: Progressing   Problem: Safety: Goal: Ability to remain free from injury will improve Outcome: Progressing   Problem: Skin Integrity: Goal: Risk for impaired skin integrity will decrease Outcome: Progressing   Problem: Education: Goal: Understanding of cardiac disease, CV risk reduction, and recovery process will improve Outcome: Progressing Goal: Understanding of medication regimen will improve Outcome: Progressing Goal: Individualized Educational Video(s) Outcome: Progressing   Problem: Activity: Goal: Ability to tolerate increased activity will improve Outcome: Progressing   Problem: Cardiac: Goal: Ability to achieve and maintain adequate cardiopulmonary perfusion will improve Outcome: Progressing Goal: Vascular access site(s) Level 0-1 will be maintained Outcome: Progressing   Problem: Health Behavior/Discharge Planning: Goal: Ability to safely manage health-related needs after discharge will improve Outcome: Progressing

## 2021-07-18 NOTE — Progress Notes (Signed)
Mobility Specialist Progress Note    07/18/21 1530  Mobility  Activity Ambulated with assistance in hallway  Level of Assistance Contact guard assist, steadying assist  Assistive Device Front wheel walker  Distance Ambulated (ft) 300 ft  Activity Response Tolerated well  $Mobility charge 1 Mobility   Pt received in bed and agreeable. No complaints on walk. Returned to sitting EOB with call bell in reach.    [place] Mobility Specialist  Primary: 5N M.S. Phone: [phone] Secondary: 6N M.S. Phone: [phone]

## 2021-07-18 NOTE — Discharge Instructions (Addendum)
You were admitted for an N-STEMI (non-ST elevation myocardial infarction), a type of heart attack. Please follow-up with your PCP at the [name] within the next week. You have been referred to Cardiac Rehab to continue to get stronger and prevent future heart attacks. We have also ordered oxygen for you to use at home, home health PT, and home health nursing to assist with medications. Your EF (ejection fraction) of your heart is around 40-45%, which is slightly lower than normal, which is greater than 55%. This means that you have heart failure. We have added and changed some of your medications to both treat your heart failure and prevent future heart attacks. It is important to manage your diabetes, blood pressure, and cholesterol to prevent future heart attacks and strokes as well.   Meds we added:  Losartan 12.5 mg daily for blood pressure, heart failure, and heart attack  Zetia (ezetimibe) 10 mg daily for high cholesterol and heart attack prevention  Brilinta (ticagrelor) 90 mg daily for heart attack prevention   Meds we changed:  Imdur increased to 60 mg for chest pain and heart failure  Jardiance was increased to 25 mg for heart failure and diabetes  Lantus (long-acting insulin) changed to 30 units in the morning and 35 units at night  Novolog (short-acting insulin) changed to 10 units three times daily with meals  Meds we stopped:  1. Plavix (clopidogrel) was replaced by Brilinta   All your other medications were kept the same. Please see the attached medication list for more information.

## 2021-07-18 NOTE — Discharge Summary (Signed)
Name: Ricardo Howard MRN: [id number] DOB: 12/30/1960 61 y.o. PCP: [facility]  Date of Admission: 07/16/2021 10:20 AM Date of Discharge: No discharge date for patient encounter. Attending Physician: Mercie Eon, MD  Discharge Diagnosis: 1. 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 Type 2 DM  4. HLD  5. HTN   Discharge Medications: Allergies as of 07/18/2021       Reactions   Iohexol Hives   Patient broke out in hives after injection of Omni 300, will need 13 hour pre-med in future   Semaglutide Other (See Comments)   Pancreatitis Ozempic   Shellfish Allergy Hives   Lac Bovis Diarrhea   Finding of gastrointestinal tract gas   Lisinopril Cough   Simvastatin Itching, Cough     Med Rec must be completed prior to using this [facility]***        Durable Medical Equipment  (From admission, onward)           Start     Ordered   07/17/21 1402  For home use only DME oxygen  Once       Question Answer Comment  Length of Need Lifetime   Mode or (Route) Nasal cannula   Liters per Minute 1   Frequency Continuous (stationary and portable oxygen unit needed)   Oxygen conserving device Yes   Oxygen delivery system Gas      07/17/21 1403            Disposition and follow-up:   Ricardo Howard was discharged from [facility] in Stable condition.  At the hospital follow up visit please address:  1.  SOB/supplemental O2 requirement, T2DM med regimen, chest pain   2.  Labs / imaging needed at time of follow-up: BMP (recently started Losartan)  3.  Pending labs/ test needing follow-up: Lipoprotein A   Follow-up Appointments:  Follow-up Information     Inc., Home Health Care Follow up.   Why: Oxygen from the [facility]- Contact information: [address] [name] [address] [phone]         [facility] Schedule an appointment as soon as possible for a visit in 1  week(s).   Contact information: [address] [place] [address] [phone]         Wendall Stade, MD. Go on 07/26/2021.   Specialty: Cardiology Why: Cardiology follow-up appointment  Wednesday, 07/26/2021 at 10 AM Contact information: [address] [place] [address] [phone]         Charlott Holler, MD. Go in 1 day(s).   Specialty: Pulmonary Disease Why: Pulmonology appointment with Dr. Celine Mans  Wednesday, 07/19/21 at 10 AM Contact information: [address] [place] [address] [phone]                 Hospital Course by problem list: Ricardo Howard is a 61 y.o. male with a history of HTN, T2DM, HLD, OSA, CAD, s/p R BKA, who presented with chest pain and was admitted for N-STEMI. Hospital course by problem is outlined below.   ED Course  On arrival, patient was hypertensive to 164/106, tachycardic to 124, and tachypneic with RR 21-27. Per EMS, he was satting 86% on RA so was placed on 2 L of O2. Labs notable for unremarkable CBC w/ diff, CMP with K of 6.1, glucose 286, AST of 76, and ALT wnl, and lipase wnl. Troponin elevated to 2174, 2022. EKG  with sinus tachycardia with RAD. Cardiology was consulted. He received morphine and was started on a nitro drip for chest pain, IV heparin 4000 units for NSTEMI, and Novolog 7U + solumedrol for hyperkalemia.   N-STEMI PAD s/p R BKA  Underwent urgent cardiac catheterization which revealed proximal RCA with 95% stenosis, s/p drug-eluting stent. Patient's home medications include ASA 81, carvedilol 12.5 mg BID, clopidogrel 75 mg daily, Imdur 30 mg daily, and atorvastatin 80 mg daily. Losartan 12.5 mg was started. Imdur was increased to 60 mg. Patient on guideline directed therapies with DAPT, beta-blocker, high-dose statin, and ARB. On discharge, referral placed to Cardiac Rehab and follow-up with [facility] Cardiology made for 07/26/21.  HLD Lipid panel with LDL 179.  Clopidogrel was changed to ticagrelor 90 mg and ezetimibe 10 mg was started for hyperlipidemia.   HFmrEF Patient's home Jardiance 12.5 mg was increased to 25 mg and he was started on low-dose Losartan 12.5 mg given history of hypotension.   T2DM  Neuropathy  Patient's home Gabapentin was continued. Patient's insulin regimen was modified to Semglee 20U BID and Novolog 10U TID with meals. Jardiance was increased to 25 mg daily.   Alcohol abuse Chronic pancreatitis  Reports drinking 12-pack of beer daily for the past 3 months. Placed on CIWA without Ativan. Continued on home pancreatic enzyme replacement therapy Creon capsule TID before meals.   Severe OSA Sleep study on 03/17/21 with severe OSA, mod O2 desaturation, recommended CPAP therapy on 13 cm H2O with Large size [employer] Full Face Mask Simplus mask and heated humidification. Patient not currently using CPAP at home, awaiting equipment.   Anxiety  Depression  Home meds Wellbutrin 150 mg BID, Latuda 40 mg qhs, and Zoloft 100 mg tablet daily were continued.    Discharge Exam:   BP 116/78 (BP Location: Right Arm)   Pulse (!) 102   Temp 98.6 F (37 C) (Oral)   Resp 18   Ht 5\' 8"  (1.727 m)   Wt 101.6 kg   SpO2 92%   BMI 34.06 kg/m  Discharge exam:  ***  Pertinent Labs, Studies, and Procedures:  Troponin 2174, 2022 HgbA1c 10.9  Left Heart Cath and Coronary Angiography  07/16/21      Mid RCA lesion is 20% stenosed.   Prox RCA lesion is 95% stenosed.   Prox Cx lesion is 40% stenosed.   Mid LAD lesion is 50% stenosed.   A stent was successfully placed.   Post intervention, there is a 0% residual stenosis.   LV end diastolic pressure is mildly elevated.  Intervention  FINDINGS 1.  High-grade calcified proximal right coronary artery lesion treated with atherectomy and 1 drug-eluting stent with patent previously placed mid to distal right coronary artery stent and proximal left circumflex stent, the latter with  moderate in-stent restenosis. 2.  Very focal mid moderate LAD lesion. 3.  LVEDP of 19 mmHg.  Echocardiogram  07/16/21 FINDINGS  Left Ventricle: Septal hypokinesis mid/basal wall akinesis. Left  ventricular ejection fraction, by estimation, is 45 to 50%. The left  ventricle has mildly decreased function. The left ventricle demonstrates  regional wall motion abnormalities. The left  ventricular internal cavity size was mildly dilated. There is mild left  ventricular hypertrophy.   Right Ventricle: The right ventricular size is normal. Right vetricular  wall thickness was not assessed. Right ventricular systolic function is  normal.   Pericardium: There is no evidence of pericardial effusion.   Mitral Valve: The mitral valve is normal in  structure. There is mild  thickening of the mitral valve leaflet(s).   Aortic Valve: The aortic valve is tricuspid. There is moderate  calcification of the aortic valve. There is mild thickening of the aortic  valve. Aortic valve sclerosis/calcification is present, without any  evidence of aortic stenosis.   Discharge Instructions: You were admitted for an N-STEMI (non-ST elevation myocardial infarction), a type of heart attack. Please follow-up with your PCP at the [facility] within the next week. You have been referred to Cardiac Rehab to continue to get stronger and prevent future heart attacks. We have also ordered oxygen for you to use at home, home health PT, and home health nursing to assist with medications. Your EF (ejection fraction) of your heart is around 40-45%, which is slightly lower than normal, which is greater than 55%. This means that you have heart failure. We have added and changed some of your medications to both treat your heart failure and prevent future heart attacks. It is important to manage your diabetes, blood pressure, and cholesterol to prevent future heart attacks and strokes as well.    Meds we added:  Losartan 12.5 mg daily for  blood pressure, heart failure, and heart attack  Zetia (ezetimibe) 10 mg daily for high cholesterol and heart attack prevention  Brilinta (ticagrelor) 90 mg daily for heart attack prevention    Meds we changed:  Imdur increased to 60 mg for chest pain and heart failure  Jardiance was increased to 25 mg for heart failure and diabetes  Lantus (long-acting insulin) changed to 30 units in the morning and 35 units at night  Novolog (short-acting insulin) changed to 10 units three times daily with meals   Meds we stopped:  1. Plavix (clopidogrel) was replaced by Brilinta    All your other medications were kept the same. Please see the attached medication list for more information.   Discharge Instructions     Amb Referral to Cardiac Rehabilitation   Complete by: As directed    Diagnosis:  Coronary Stents NSTEMI PTCA     After initial evaluation and assessments completed: Virtual Based Care may be provided alone or in conjunction with Phase 2 Cardiac Rehab based on patient barriers.: Yes       Signed: Orland DecBlanks, Kendrick Haapala L, Medical Student 07/18/2021, 1:43 PM   Pager: [phone]  Please contact the on call pager after 5 pm and on weekends at [phone].

## 2021-07-18 NOTE — TOC Initial Note (Addendum)
Transition of Care [facility]) - Initial/Assessment Note    Patient Details  Name: Ricardo Howard MRN: [id number] Date of Birth: 04-09-1960  Transition of Care [facility]) CM/SW Contact:    Gala Lewandowsky, RN Phone Number: 07/18/2021, 10:23 AM  Clinical Narrative:  Case Manager received orders for home oxygen for the patient. Case Manager spoke with patient and spouse and both are agreeable to home oxygen via the [facility]. Case Manager has reached out to the oxygen department at the [facility]- awaiting call back and confirmation for the delivery via [place] in [name]. Case Manager discussed home health services with the patient and he is agreeable to a [facility] RN for disease and medication management. Patient has used [facility] in the past and wants to use them again. Case Manager did reach out to the agency to see if they can see the patient again post hospitalization-awaiting confirmation. Case Manager continuing to follow for disposition needs.   1314 07-18-21 [facility] received the DME orders for oxygen. [facility] [name] has not called the Case Manager for confirmation of delivery. Case Manager received information that [facility] cannot service the patient for home health services. Case Manager then called [facility] and they can service the patient for [facility] RN Services for medication management. Case Manager will continue to follow for additional transition of care needs.    1623 07-18-21 Case Manager called [facility] and no orders have been [facility]. Case Manager did make family aware and provider will attempt to call [facility]. Patient may need to stay overnight.    Expected Discharge Plan: Home w Home Health Services Barriers to Discharge: Transportation   Patient Goals and CMS Choice Patient states their goals for this hospitalization and ongoing recovery are:: to return home   Choice offered to / list presented to : Patient, Spouse (Family  has used [facility] in the past and wants to use again)  Expected Discharge Plan and Services Expected Discharge Plan: Home w Home Health Services In-house Referral: NA Discharge Planning Services: CM Consult Post Acute Care Choice: Home Health Living arrangements for the past 2 months: Single Family Home                 DME Arranged: Oxygen DME Agency: [facility] [place] Date DME Agency Contacted: 07/18/21 Time DME Agency Contacted: 1022 Representative spoke with at DME Agency: Morrie Sheldon HH Arranged: RN, Disease Management HH Agency: [facility] Date [facility] Agency Contacted: 07/18/21 Time HH Agency Contacted: 1022 Representative spoke with at [facility] Agency: Amy-Jennifer   Prior Living Arrangements/Services Living arrangements for the past 2 months: Single Family Home Lives with:: Spouse Patient language and need for interpreter reviewed:: Yes Do you feel safe going back to the place where you live?: Yes      Need for Family Participation in Patient Care: Yes (Comment) Care giver support system in place?: Yes (comment)   Criminal Activity/Legal Involvement Pertinent to Current Situation/Hospitalization: No - Comment as needed  Activities of Daily Living Home Assistive Devices/Equipment: Prosthesis, CPAP, CBG Meter ADL Screening (condition at time of admission) Patient's cognitive ability adequate to safely complete daily activities?: Yes Is the patient deaf or have difficulty hearing?: No Does the patient have difficulty seeing, even when wearing glasses/contacts?: No Does the patient have difficulty concentrating, remembering, or making decisions?: No Patient able to express need for assistance with ADLs?: Yes Does the patient have difficulty dressing or bathing?: No  Independently performs ADLs?: Yes (appropriate for developmental age) Does the patient have difficulty walking or climbing stairs?: Yes Weakness of Legs: Both Weakness of Arms/Hands:  None  Permission Sought/Granted Permission sought to share information with : Case Manager, Family Supports, Oceanographer granted to share information with : Yes, Verbal Permission Granted     Permission granted to share info w AGENCY: [facility], [phone].        Emotional Assessment Appearance:: Appears stated age Attitude/Demeanor/Rapport: Engaged Affect (typically observed): Appropriate Orientation: : Oriented to Place, Oriented to Self, Oriented to Situation, Oriented to  Time Alcohol / Substance Use: Not Applicable Psych Involvement: No (comment)  Admission diagnosis:  NSTEMI (non-ST elevated myocardial infarction) [facility]) [I21.4] Patient Active Problem List   Diagnosis Date Noted   NSTEMI (non-ST elevated myocardial infarction) (HCC) 07/16/2021   Hyperkalemia 07/16/2021   Anxiety 07/16/2021   PTSD (post-traumatic stress disorder) 07/16/2021   Depression 07/16/2021   DKA (diabetic ketoacidosis) (HCC) 05/31/2021   Tobacco abuse 07/18/2020   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   Pancreatitis 10/07/2019   Dehydration 10/07/2019   Hyperglycemia 10/07/2019   Occult blood in stools    Mixed hyperlipidemia due to type 2 diabetes mellitus (HCC) 07/31/2019   Diabetes mellitus with diabetic neuropathy, with long-term current use of insulin (HCC) 07/31/2019   Nicotine dependence, cigarettes, uncomplicated 07/31/2019   Complaint of melena 07/31/2019   Pancreatic duct calculus 06/21/2019   Elevated LFTs 06/21/2019   DKA (diabetic ketoacidoses) 06/14/2019   Alcohol abuse 06/14/2019   Essential hypertension 06/14/2019   PCP:  [facility] Pharmacy:   [facility] [phone] [place], [place] - [address] AT [facility] [address] & [facility] [address] [place] [address] Phone: [phone] Fax: [phone]  [facility] [address] [place] [address] Phone:  [phone] Fax: [phone]  [facility] - [place], [place] - [address] [place] [address] Phone: [phone] Fax: [phone]  Readmission Risk Interventions     View : No data to display.

## 2021-07-18 NOTE — Progress Notes (Signed)
SATURATION QUALIFICATIONS: (This note is used to comply with regulatory documentation for home oxygen)  Patient Saturations on Room Air at Rest = 92%  Patient Saturations on Room Air while Ambulating = 85%  Patient Saturations on 1 Liters of oxygen while Ambulating = 91%  Please briefly explain why patient needs home oxygen:Pt with desaturation and symptomatic SOB with gait without O2 and requires 1L to maintain sats >90%. [place] Pager: [phone] Office: [phone]

## 2021-07-18 NOTE — Research (Incomplete)
Protocol # [id number]  Subject Initial ID#:                                  Age in years: 63  *Demographics are found in the [facility] EMR source.   Protocol Version: 2.00  Were all Eligibility Criteria Met?  _0  Yes  _1   No  Screening Visit Date:             18-Jul-2021  10.2 Targeted Medical History:  Qualifying Acute Myocardial Infarction Event  _2  NSTEMI  _3  STEMI  Admission Date For Index Myocardial Infarction:   16-Jul-2021  Initial Management Strategy: _4  Medical Management ONLY     _5  Invasive Mgmt (including Lysis)  Diagnostic Coronary Angiography Performed for Index MI Event? _6  Yes _7  No   Diagnostic Coronary Angiography Performed For Index Myocardial Infarction Event (Select All That Apply)    _8  Invasive Coronary Angiography         _9  Computed Tomography (CT) angiography   Coronary Revascularization Performed For Index Myocardial Infarction Event?    _10  Yes  _11  No  Coronary Revascularization Performed For Index Myocardial Infarction Event (Select All That Apply)  _12 Percutaneous Coronary Intervention (PCI)                                        _13  Coronary Artery Bypass Graft (CABG)                  _14  Fibrinolysis  Ejection Fraction Measured?  _15  Yes  _16   No  Ejection Fraction (%) _50___  (must be whole number)  Peak Troponin Level For Index Myocardial Infarction Event?  __2174____   Peak Troponin Unit For Index Myocardial Infarction Event?  ng/L Troponin Normal Range Upper Limit    _18__  History of Multi-vessel Coronary Artery Disease (Including defined during index myocardial infarction event)   _17  Yes  _18   No  Cardiovascular and Other Medical History History of Acute Myocardial Infarction Prior To Index Event   _19  Yes _20  No  History of Coronary Artery Bypass Graft (CABG) _21  Yes  _22   No  History of Percutaneous Coronary Intervention (PCI)  _23  Yes  _24   No  History Of Hypertension  _25  Yes  _26   No  History of Diabetes Mellitus   _27  Yes  _28   No  History of Peripheral Artery Disease (Lower Extremity, Carotid Disease)                            _29  Yes  _30   No  History of Cerebrovascular Disease   (Prior Stroke, Transient Ischemic Attack (TIA))  _31  Yes  _32   No  History of Lower Extremity Revascularization  _33  Yes  _34   No  History of Major Lower Extremity Amputation At Or Above The Level Of The Ankle                        _35  Yes  _36   No  History of Smoking Tobacco  _37  Never Smoked     _38  Former Smoker     _39  Current Smoker     _40  Unknown  VITAL SIGNS (after Consent is signed) Height   _173______       _41  centimeters  _42   Inches  Weight _101.6_____  _0  Kilograms  <[id number]>_0<\/[id number]>_9  Lbs   Systolic Blood Pressure (mmHg)    ______  (Most recent Prior to Randomization)  Diastolic Blood Pressure (mmHg)    _____  (Most recent Prior to Randomization)   See labs for lab values

## 2021-07-18 NOTE — Progress Notes (Addendum)
Hospital day: 2  Subjective:   Overnight events: No acute events overnight.   Patient seen at bedside during morning rounds. Reports significant improvement in his chest pain but notes that it is still present with deep inspiration. Still becoming SOB with exertion. Is looking forward to going home and tending to his garden.   Objective:  Vital signs in last 24 hours: Vitals:   07/18/21 0657 07/18/21 0717 07/18/21 0756 07/18/21 1552  BP: 116/78   96/63  Pulse: 91 (!) 0 (!) 102 93  Resp: 18   18  Temp: 98.6 F (37 C)   97.6 F (36.4 C)  TempSrc: Oral   Oral  SpO2: 98%  92% 94%  Weight:      Height:        Filed Weights   07/17/21 0022  Weight: 101.6 kg     Intake/Output Summary (Last 24 hours) at 07/18/2021 1709 Last data filed at 07/18/2021 0657 Gross per 24 hour  Intake 240 ml  Output 1600 ml  Net -1360 ml   Net IO Since Admission: -2,767.5 mL [07/18/21 1709]   Pertinent Labs:    Latest Ref Rng & Units 07/18/2021    5:55 AM 07/17/2021    1:12 AM 07/16/2021    2:31 PM  CBC  WBC 4.0 - 10.5 K/uL 8.5   8.9     Hemoglobin 13.0 - 17.0 g/dL 13.5   15.0   17.0    Hematocrit 39.0 - 52.0 % 40.7   45.9   50.0    Platelets 150 - 400 K/uL 116   PLATELET CLUMPS NOTED ON SMEAR, UNABLE TO ESTIMATE          Latest Ref Rng & Units 07/18/2021    5:55 AM 07/17/2021    1:12 AM 07/16/2021    2:31 PM  CMP  Glucose 70 - 99 mg/dL 123   344     BUN 8 - 23 mg/dL 14   9     Creatinine 0.61 - 1.24 mg/dL 0.85   0.75     Sodium 135 - 145 mmol/L 137   136   136    Potassium 3.5 - 5.1 mmol/L 3.3   4.1   6.8    Chloride 98 - 111 mmol/L 104   106     CO2 22 - 32 mmol/L 26   24     Calcium 8.9 - 10.3 mg/dL 8.8   8.8       Recent Labs    07/18/21 0746 07/18/21 1112 07/18/21 1612  GLUCAP 147* 234* 120*     Imaging: No results found.  Physical Exam  Gen: Pleasant male, sitting up in chair  HEENT: Normotraumatic, anicteric sclerae, nasal cannula in place CV: RRR, no murmurs  heard Pulm: Lungs CTAB, normal WOB on 1L of supplemental O2 Abd: Soft, nontender Extremeties: R leg BKA, LLE no edema  Skin: Warm, dry  Neuro: A&Ox3 Psych: Normal behavior and affect   Assessment/Plan: Ricardo Howard is a 61 y.o. male with a history of  uncontrolled T2DM,  CHF (LVEF 40-45%), HTN, HLD, severe OSA, PAD s/p R BKA in 02/2020 and multiple LLE stents, alcohol use disorder, recurrent pancreatitis, and recent hospitalization for sepsis 2/2 CAP and DKA who is presenting with acute chest pain, admitted for NSTEMI.   Principal Problem:   NSTEMI (non-ST elevated myocardial infarction) ([place]) Active Problems:   Alcohol abuse   Essential hypertension   Diabetes mellitus with diabetic neuropathy, with long-term  current use of insulin (HCC)   HFrEF (heart failure with reduced ejection fraction) ([place])  # N-STEMI  # CHF Patient underwent emergent left heart cath yesterday that showed high-grade stenosis in proximal to mid RCA treated with atherectomy and long DES. Patient was found to have residual moderate disease in proximal LAD and LCx. TTE on 5/28 showed EF 45-50%, septal hypokinesis mid/basal wall akinesis, and mildly dilated LV internal cavity. Currently requiring 1L of supplemental O2 for desaturations to 86% with ambulation. We will work on risk factor modification maximize guideline directed medical therapy.  - Cardiology signed off, appreciate assistance  - Continue Coreg 12.5 mg BID, Imdur 60 mg daily, Jardiance 25 mg daily - Lasix 20 mg daily - Losartan 12.5 mg daily - Home supplemental O2   # T2DM  # Neuropathy  Poorly controlled, HgbA1c 10.9. Reports current home regimen is Jardiance 12.5 mg daily, Novolog 30U BID, Lantus 35U BID, and Novolog 20U + SSI TID with meals. Patient's blood sugar elevated to 511 overnight after receiving steroids during the day. Received extra dose of short acting overnight. CBGs remains elevated in the 200s to 300, will increase Novolog to 10U  TID with meals.  - Semglee 20 units BID - Increase Novolog 10U TID with meals  - Continue SSI with night coverage - Jardiance 25 mg daily - Continue gabapentin   # HTN Patient with a history of hypotension during prior hospitalization. SBP has been in the 110s to 140s since admission, and 116/78 since starting Losartan.  - Continue Coreg and Imdur as above - Low dose losartan as above   # PAD # s/p R BKA  # Chronic LLE limb threatening ischemia  # Tobacco use Lipid panel with LDL 193. Home regimen includes Aspirin 81 mg daily, atorvastatin 80 mg qhs, and clopidogrel 75 mg daily. Patient currently smokes about 4 cigarettes daily, which is a significant decrease from 1 pack per day.  Refused nicotine patch. Patient counseled on importance of smoking cessation. Will add Zetia to help improve LDL.  - Continue ASA 81 mg daily and atorvastatin 80 mg daily - Brilinta 90 mg BID per cardiology recs - Zetia 10 mg daily   # Alcohol abuse # Chronic pancreatitis  Reports drinking 12-pack of beer daily for the past 3 months. Denies severe withdrawal or history of withdrawal seizures. Lipase wnl. Takes pancreatic enzyme replacement therapy Creon capsule TID before meals. CT abdomen from 4/12 with multiple calcifications of pancreatic head.  - Continue Creon 36000 unit capsule TID before meals  - CIWA w/out Ativan   #Severe OSA Sleep study on 03/17/21 with severe OSA. Per RRT, patient states he does not wear CPAP at home at this time but previously used it several years ago. Currently awaiting equipment at home.    # Depression # Anxiety # PTSD Chronic and [facility]  - Continue home Wellbutrin 150 mg BID, Latuda 40 mg qhs, and Zoloft 100 mg tablet daily.   # CXR w/ RLL opacity  # Concern for underlying neoplasm  CT chest from 4/12 with dense right middle lobe consolidation and additional foci of consolidation and nodularity in the right lower lobe, suspected to be secondary to  respiratory infection/pneumonia but recommended follow-up to resolution to exclude underlying neoplasm. CXR today with continued RLL opacity but no clinical findings of pneumonia. - Patient will need outpatient CT chest    # Constipation Continue Miralax daily PRN  # GERD Continue home Protonix 40 mg daily.  Diet: Heart healthy  IVF: None VTE: Heparin  CODE: Code  Prior to Admission Living Arrangement: Home Anticipated Discharge Location: Home with [facility] RN and HH PT Barriers to Discharge: Patient is medically cleared for discharge at this time.  Dispo: Anticipated discharge in 0-1 days   Signed: Sabino Dick, MS4 Internal Medicine Teaching Service [phone] Please contact the on call pager after 5 pm and on weekends at [phone].

## 2021-07-19 ENCOUNTER — Institutional Professional Consult (permissible substitution): Payer: No Typology Code available for payment source | Admitting: Internal Medicine

## 2021-07-19 LAB — GLUCOSE, CAPILLARY
Glucose-Capillary: 146 mg/dL — ABNORMAL HIGH (ref 70–99)
Glucose-Capillary: 241 mg/dL — ABNORMAL HIGH (ref 70–99)

## 2021-07-19 LAB — LIPOPROTEIN A (LPA): Lipoprotein (a): 71.1 nmol/L — ABNORMAL HIGH (ref <75.0)

## 2021-07-19 MED ORDER — ENOXAPARIN SODIUM 40 MG/0.4ML IJ SOSY
40.0000 mg | PREFILLED_SYRINGE | Freq: Every day | INTRAMUSCULAR | Status: DC
Start: 2021-07-19 — End: 2021-07-19
  Administered 2021-07-19: 40 mg via SUBCUTANEOUS
  Filled 2021-07-19: qty 0.4

## 2021-07-19 MED ORDER — EMPAGLIFLOZIN 25 MG PO TABS: 25.0000 mg | ORAL_TABLET | Freq: Every day | ORAL | 0 refills | Status: DC

## 2021-07-19 MED ORDER — ISOSORBIDE MONONITRATE ER 60 MG PO TB24: 60.0000 mg | ORAL_TABLET | Freq: Every day | ORAL | 0 refills | Status: DC

## 2021-07-19 MED ORDER — NITROGLYCERIN 0.4 MG SL SUBL: 0.4000 mg | SUBLINGUAL_TABLET | SUBLINGUAL | 0 refills | Status: AC | PRN

## 2021-07-19 MED ORDER — LOSARTAN POTASSIUM 25 MG PO TABS: 12.5000 mg | ORAL_TABLET | Freq: Every day | ORAL | 0 refills | Status: DC

## 2021-07-19 MED ORDER — INSULIN ASPART 100 UNIT/ML IJ SOLN
10.0000 [IU] | Freq: Three times a day (TID) | INTRAMUSCULAR | 11 refills | Status: DC
Start: 2021-07-19 — End: 2023-02-14

## 2021-07-19 MED ORDER — EZETIMIBE 10 MG PO TABS: 10.0000 mg | ORAL_TABLET | Freq: Every day | ORAL | 0 refills | Status: DC

## 2021-07-19 MED ORDER — TICAGRELOR 90 MG PO TABS: 90.0000 mg | ORAL_TABLET | Freq: Two times a day (BID) | ORAL | 0 refills | Status: DC

## 2021-07-19 NOTE — Progress Notes (Signed)
CARDIAC REHAB PHASE I   PRE:  Rate/Rhythm: 87 SR    BP: sitting 125/84    SaO2: 97 2L  MODE:  Ambulation: 140 ft   POST:  Rate/Rhythm: 96 SR    BP: sitting 125/105     SaO2: 96 2L  Pt awaiting d/c, willing to walk. Declined RW, preferred to intermittently walk close to wall. Staggered gait, pt pulled O2 tank. Min assist with gait belt. Pt prompted to rest x2 due to fatigue and increased instability. Return to EOB, fatigued. Encouraged RW at home. He has gait belt in room.  Reviewed Brilinta and smoking cessation. Pt lacked carry over from yesterdays education but we discussed ways to distract from urge to smoke. Wife was very receptive yesterday. [id number]   Ethelda Chick CES, ACSM 07/19/2021 1:32 PM

## 2021-07-19 NOTE — Progress Notes (Signed)
Pt declines CPAP. No unit in room at this time. °

## 2021-07-19 NOTE — TOC Transition Note (Signed)
Transition of Care [facility]) - CM/SW Discharge Note   Patient Details  Name: Steveland Gaschler MRN: [id number] Date of Birth: Nov 28, 1960  Transition of Care [facility]) CM/SW Contact:  Bethena Roys, RN Phone Number: 07/19/2021, 10:09 AM   Clinical Narrative:  Case Manager reached out to [employer] for oxygen delivery. Orders have been submitted and the agency will call the spouse once ready for delivery. No further needs from Case Manager at this time.    Final next level of care: [place] Barriers to Discharge: Transportation   Patient Goals and CMS Choice Patient states their goals for this hospitalization and ongoing recovery are:: to return home   Choice offered to / list presented to : Patient, Spouse (Family has used [facility] in the past and wants to use again)   Discharge Plan and Services In-house Referral: NA Discharge Planning Services: CM Consult Post Acute Care Choice: Home Health          DME Arranged: Oxygen DME Agency: [facility], [place] Date DME Agency Contacted: 07/18/21 Time DME Agency Contacted: 35 Representative spoke with at DME Agency: Caryl Pina HH Arranged: RN, Disease Management [place] Agency: [facility] Date [place]: 07/18/21 Time [place]: [place] Representative spoke with at [place]: Anderson [place]    Readmission Risk Interventions     View : No data to display.

## 2021-07-20 ENCOUNTER — Telehealth: Payer: Self-pay | Admitting: Pulmonary Disease

## 2021-07-21 NOTE — Telephone Encounter (Signed)
Called patient and he states that he would like the results of his home sleep test that was done on 03/21/21. Advised patient that I would message Dr Jenetta Downer to get those results.  Dr Jenetta Downer would you be so kind as to look at this home sleep study.   Thank you

## 2021-07-21 NOTE — Telephone Encounter (Signed)
Result was entered on record 03/18/2021  Documentation that he was called with results 03/21/2021

## 2021-07-21 NOTE — Telephone Encounter (Signed)
Spoke with the pt  I went over his HST results with him  We ordered CPAP on 03/21/21 and he says no one ever called him about this  He is asking how much longer until he gets his machine  Sending to [facility] pool per protocol

## 2021-07-24 ENCOUNTER — Telehealth (HOSPITAL_COMMUNITY): Payer: Self-pay

## 2021-07-24 NOTE — Telephone Encounter (Signed)
Pt is covered thru the [name], pt stated he will contact the [name] to get auth.

## 2021-07-24 NOTE — Telephone Encounter (Signed)
Order was placed on 1/31 and per notes in referral I faxed to pt's pcp at [name] - Dr Jeri Lager - on 2/1 and spoke to her nurse Ron and made him aware I was faxing order.  I spoke to pt & made him aware of what I did and he is going to call Dr Freddrick March office.  Nothing further needed at this time.

## 2021-07-24 NOTE — Telephone Encounter (Signed)
Called patient to see if he is interested in the Cardiac Rehab Program. Patient expressed interest. Explained scheduling process and went over insurance, patient verbalized understanding. Will contact patient for scheduling once f/u has been completed.  °

## 2021-07-25 NOTE — Progress Notes (Deleted)
Cardiology Office Note:    Date:  07/25/2021   ID:  Ricardo Howard, DOB April 11, 1960, MRN [id number]  PCP:  [facility], [facility]   [facility] Providers Cardiologist:  Charlton Haws, MD { Click to update primary MD,subspecialty MD or APP then REFRESH:1}    Referring MD: [facility], [facility]   Chief Complaint: ***  History of Present Illness:    Ricardo Howard is a *** 61 y.o. male with a hx of EtOH abuse, tobacco abuse, poorly controlled DM, chronic pancreatitis, hypertension, hyperlipidemia, OSA, and PVD s/p right BKA.  He reports 2 previous cardiac stents over 10 years ago in [place] and one at [place]. Had right below-knee amputation January 2022  He presented to ED on 07/16/2021 with SSCP 5 days prior.  Worsening intermittent and more persistent chest pain that morning with no dyspnea or syncope.  EKG nonacute but troponin elevated to 2174.  CXR normal mediastinum with basilar atelectasis cannot r/o LLL infiltrate, D-dimer negative.  Echo revealed septal hypokinesis mid/basal wall akinesis.  LVEF 45 to 50%, regional wall motion abnormalities, mild LVH, normal RV, aortic valve calcification present without stenosis. He continued to have chest pain and was taken emergently to Cath Lab.  Cardiac catheterization revealed high-grade calcified proximal RCA lesion treated with atherectomy and 1 DES, patent previously placed mid to distal RCA stent and proximal LCx stent with moderate in-stent restenosis.  Very focal mid moderate LAD lesion, LVEDP 19 mmHg, recommendation for DAPT x 1 year.  He was discharged on 07/19/2021  Today, he is here   Research study  Past Medical History:  Diagnosis Date   Alcohol abuse 06/14/2019   Anxiety    Back pain    Cataracts, bilateral    CHF (congestive heart failure) (HCC)    Depression    Diabetic retinopathy (HCC)    Essential hypertension 06/14/2019   Hepatitis C    cured   Hx of BKA, right (HCC)    Hyperlipidemia     Hypertension    Mixed hyperlipidemia due to type 2 diabetes mellitus (HCC) 07/31/2019   Nicotine dependence, cigarettes, uncomplicated 07/31/2019   PAD (peripheral artery disease) (HCC)    Pancreatitis    PTSD (post-traumatic stress disorder)    Uncontrolled type 2 diabetes mellitus with diabetic polyneuropathy, with long-term current use of insulin 07/31/2019    Past Surgical History:  Procedure Laterality Date   ABDOMINAL AORTOGRAM W/LOWER EXTREMITY N/A 12/02/2020   Procedure: ABDOMINAL AORTOGRAM W/LOWER EXTREMITY;  Surgeon: Leonie Douglas, MD;  Location: [facility] INVASIVE CV LAB;  Service: Cardiovascular;  Laterality: N/A;   AMPUTATION Right 03/13/2020   Procedure: TRANSMETATARSAL AMPUTATION;  Surgeon: Chuck Hint, MD;  Location: [facility] OR;  Service: Vascular;  Laterality: Right;   AMPUTATION Right 03/15/2020   Procedure: RIGHT BELOW KNEE AMPUTATION;  Surgeon: Chuck Hint, MD;  Location[facility];  Service: Vascular;  Laterality: Right;   CORONARY ANGIOPLASTY WITH STENT PLACEMENT     CORONARY STENT INTERVENTION N/A 07/16/2021   Procedure: CORONARY STENT INTERVENTION;  Surgeon: Orbie Pyo, MD;  Location: [facility] INVASIVE CV LAB;  Service: Cardiovascular;  Laterality: N/A;   LEFT HEART CATH AND CORONARY ANGIOGRAPHY N/A 07/16/2021   Procedure: LEFT HEART CATH AND CORONARY ANGIOGRAPHY;  Surgeon: Orbie Pyo, MD;  Location: [facility] INVASIVE CV LAB;  Service: Cardiovascular;  Laterality: N/A;   PERIPHERAL VASCULAR INTERVENTION  12/02/2020   Procedure: PERIPHERAL VASCULAR INTERVENTION;  Surgeon: Leonie Douglas, MD;  [facility] INVASIVE  CV LAB;  Service: Cardiovascular;;  Lt SFA, LEIA    Current Medications: No outpatient medications have been marked as taking for the 07/26/21 encounter (Appointment) with Lissa Hoard, Zachary George, NP.     Allergies:   Iohexol, Semaglutide, Shellfish allergy, Lac bovis, Lisinopril, and Simvastatin   Social History   Socioeconomic History    Marital status: Married    Spouse name: Not on file   Number of children: Not on file   Years of education: Not on file   Highest education level: Not on file  Occupational History   Not on file  Tobacco Use   Smoking status: Every Day    Packs/day: 0.25    Types: Cigarettes   Smokeless tobacco: Never  Vaping Use   Vaping Use: Never used  Substance and Sexual Activity   Alcohol use: Yes    Comment: etoh abuse   Drug use: Not Currently   Sexual activity: Not on file  Other Topics Concern   Not on file  Social History Narrative   Not on file   Social Determinants of Health   Financial Resource Strain: Not on file  Food Insecurity: Not on file  Transportation Needs: Not on file  Physical Activity: Not on file  Stress: Not on file  Social Connections: Not on file     Family History: The patient's ***family history is negative for Other.  ROS:   Please see the history of present illness.    *** All other systems reviewed and are negative.  Labs/Other Studies Reviewed:    The following studies were reviewed today:  LHC 07/16/21    Mid RCA lesion is 20% stenosed.   Prox RCA lesion is 95% stenosed.   Prox Cx lesion is 40% stenosed.   Mid LAD lesion is 50% stenosed.   A stent was successfully placed.   Post intervention, there is a 0% residual stenosis.   LV end diastolic pressure is mildly elevated.   1.  High-grade calcified proximal right coronary artery lesion treated with atherectomy and 1 drug-eluting stent with patent previously placed mid to distal right coronary artery stent and proximal left circumflex stent, the latter with moderate in-stent restenosis. 2.  Very focal mid moderate LAD lesion. 3.  LVEDP of 19 mmHg.   Recommendation: Dual antiplatelet therapy for 1 year.  Intervention     Echo 07/16/21  1. Septal hypokinesis mid/basal wall akinesis . Left ventricular ejection  fraction, by estimation, is 45 to 50%. The left ventricle has mildly   decreased function. The left ventricle demonstrates regional wall motion  abnormalities (see scoring  diagram/findings for description). The left ventricular internal cavity  size was mildly dilated. There is mild left ventricular hypertrophy.   2. Right ventricular systolic function is normal. The right ventricular  size is normal.   3. The mitral valve is normal in structure. No evidence of mitral valve  regurgitation.   4. The aortic valve is tricuspid. There is moderate calcification of the  aortic valve. There is mild thickening of the aortic valve. Aortic valve  sclerosis/calcification is present, without any evidence of aortic  stenosis.   Recent Labs: 07/16/2021: ALT 42; B Natriuretic Peptide 56.4; TSH 0.580 07/18/2021: BUN 14; Creatinine, Ser 0.85; Hemoglobin 13.5; Magnesium 2.0; Platelets 116; Potassium 3.3; Sodium 137  Recent Lipid Panel    Component Value Date/Time   CHOL 249 (H) 07/16/2021 1448   TRIG 58 07/16/2021 1448   HDL 44 07/16/2021 1448   CHOLHDL 5.7 07/16/2021  1448   VLDL 12 07/16/2021 1448   LDLCALC 193 (H) 07/16/2021 1448     Risk Assessment/Calculations:   {Does this patient have ATRIAL FIBRILLATION?:[phone]}       Physical Exam:    VS:  There were no vitals taken for this visit.    Wt Readings from Last 3 Encounters:  07/17/21 223 lb 15.8 oz (101.6 kg)  05/30/21 265 lb (120.2 kg)  05/02/21 240 lb (108.9 kg)     GEN: *** Well nourished, well developed in no acute distress HEENT: Normal NECK: No JVD; No carotid bruits CARDIAC: ***RRR, no murmurs, rubs, gallops RESPIRATORY:  Clear to auscultation without rales, wheezing or rhonchi  ABDOMEN: Soft, non-tender, non-distended MUSCULOSKELETAL:  No edema; No deformity. *** pedal pulses, ***bilaterally SKIN: Warm and dry NEUROLOGIC:  Alert and oriented x 3 PSYCHIATRIC:  Normal affect   EKG:  EKG is *** ordered today.  The ekg ordered today demonstrates ***  Diagnoses:    No diagnosis  found. Assessment and Plan:     CAD s/p DES to RCA HFmrEF: Hypertension: Hyperlipidemia LDL goal < 70:  {The patient has an active order for outpatient cardiac rehabilitation.   Please indicate if the patient is ready to start. Do NOT delete this.  It will auto delete.  Refresh note, then sign.              Click here to document readiness and see contraindications.  :1}  Cardiac Rehabilitation Eligibility Assessment       {The patient has an active order for outpatient cardiac rehabilitation.   Please indicate if the patient is ready to start. Do NOT delete this.  It will auto delete.  Refresh note, then sign.              Click here to document readiness and see contraindications.  :1}  Cardiac Rehabilitation Eligibility Assessment       {Are you ordering a CV Procedure (e.g. stress test, cath, DCCV, TEE, etc)?   Press F2        :[id number]}    Medication Adjustments/Labs and Tests Ordered: Current medicines are reviewed at length with the patient today.  Concerns regarding medicines are outlined above.  No orders of the defined types were placed in this encounter.  No orders of the defined types were placed in this encounter.   There are no Patient Instructions on file for this visit.   Signed, Levi AlandSwinyer, Jaquala Fuller M, NP  07/25/2021 5:53 AM    [facility]

## 2021-07-26 ENCOUNTER — Encounter: Payer: Self-pay | Admitting: *Deleted

## 2021-07-26 ENCOUNTER — Ambulatory Visit: Payer: Medicaid Other | Admitting: Nurse Practitioner

## 2021-07-26 DIAGNOSIS — Z006 Encounter for examination for normal comparison and control in clinical research program: Secondary | ICD-10-CM

## 2021-07-26 NOTE — Research (Signed)
Pt wants to pass on being research study at this time.  Thanked him for taking the time to listen.  Mercer Pod :) RN BSN  Clinical research nurse  Be strong and take heart, all you who hope in the [place]. ~ Psalm 31:24

## 2021-08-01 ENCOUNTER — Institutional Professional Consult (permissible substitution): Payer: No Typology Code available for payment source | Admitting: Internal Medicine

## 2021-08-30 ENCOUNTER — Ambulatory Visit (INDEPENDENT_AMBULATORY_CARE_PROVIDER_SITE_OTHER): Payer: No Typology Code available for payment source | Admitting: Pharmacist

## 2021-08-30 VITALS — BP 82/60 | HR 110 | Wt 236.0 lb

## 2021-08-30 DIAGNOSIS — I5020 Unspecified systolic (congestive) heart failure: Secondary | ICD-10-CM | POA: Diagnosis not present

## 2021-08-30 DIAGNOSIS — I214 Non-ST elevation (NSTEMI) myocardial infarction: Secondary | ICD-10-CM | POA: Diagnosis not present

## 2021-08-30 MED ORDER — EMPAGLIFLOZIN 25 MG PO TABS: 25.0000 mg | ORAL_TABLET | Freq: Every day | ORAL | 3 refills | Status: AC

## 2021-08-30 MED ORDER — EZETIMIBE 10 MG PO TABS: 10.0000 mg | ORAL_TABLET | Freq: Every day | ORAL | 3 refills | Status: DC

## 2021-08-30 MED ORDER — TICAGRELOR 90 MG PO TABS: 90.0000 mg | ORAL_TABLET | Freq: Two times a day (BID) | ORAL | 1 refills | Status: DC

## 2021-08-30 MED ORDER — ATORVASTATIN CALCIUM 80 MG PO TABS: 80.0000 mg | ORAL_TABLET | Freq: Every day | ORAL | 3 refills | Status: DC

## 2021-08-30 MED ORDER — ATORVASTATIN CALCIUM 80 MG PO TABS: 80.0000 mg | ORAL_TABLET | Freq: Every day | ORAL | 3 refills | Status: AC

## 2021-08-30 MED ORDER — EMPAGLIFLOZIN 25 MG PO TABS: 25.0000 mg | ORAL_TABLET | Freq: Every day | ORAL | 3 refills | Status: DC

## 2021-08-30 NOTE — Progress Notes (Signed)
Patient ID: Ricardo Howard                 DOB: 06-Feb-1961                      MRN: [id number]     HPI: [name] is a 61 y.o. male referred by hospital MD to pharmacy clinic for HF medication management. Patient was admitted to hospital 5/28 for NSTEMI. PMH is significant for CAD, DM, CHF, PAD, HLD, OSA and alcohol abuse. Most recent LVEF 45-50%.  Patient missed discharge appointment last month. Sounds like confusion as wife states that doctor canceled apt but the computer has him as a no show. Patient presents to clinic today accompanied by his wife and his young grandson. He tells me he is out of atorvastatin, ezetimibe, Jardiance, B12, Brilinta, Latuda and Lialda for the past 4 days. Symptomatically, he is short of breath when walking. Denies dizziness, lightheadedness, and fatigue. No chest pain or palpitations. Feels SOB when walking or lying down. Wife states she checks weight at home every other day. Weight fluctuates 2 pounds or so. His weight from May was 224, now 236. Although I am unsure if his weight in May was with or without his prosthetic leg. Per wife weight at home (normal range 236 - 237 lbs). No Kaushik. Positive for orthopnea (sleeping on 4 pillows). On exam, no LLE. Patient denies tightness in belly. He is warm to the touch, however he is hypotension.   He is taking amlodipine which was not on his medication list. Also unsure if he is taking 50mg  (1/2 of losartan 100) or 12.5mg  (1/2 of losartan 25mg ). Patient uses the pharmacy. Chart said furosemide 10mg , but we believe it is really 20mg  daily. Patient states his BP at home is in the 100's systolic. In clinic BP was 82/60. Checked twice to confirm. HR 110.   Current CHF meds: carvedilol 12.5mg  twice a day, Jardiance 25mg  daily (ran out), furosemide 20mg  daily, isosorbide 60mg  daily, losartan 12.5mg  daily Previously tried:  BP goal: <130/80  Home BP readings: patient reports BP is the 100's systloic  Wt Readings from  Last 3 Encounters:  07/17/21 223 lb 15.8 oz (101.6 kg)  05/30/21 265 lb (120.2 kg)  05/02/21 240 lb (108.9 kg)   BP Readings from Last 3 Encounters:  07/19/21 114/81  06/03/21 (!) 137/92  05/02/21 128/89   Pulse Readings from Last 3 Encounters:  07/19/21 86  06/03/21 81  05/02/21 91    Renal function: CrCl cannot be calculated (Patient's most recent lab result is older than the maximum 21 days allowed.).  Past Medical History:  Diagnosis Date   Alcohol abuse 06/14/2019   Anxiety    Back pain    Cataracts, bilateral    CHF (congestive heart failure) (HCC)    Depression    Diabetic retinopathy (HCC)    Essential hypertension 06/14/2019   Hepatitis C    cured   Hx of BKA, right (HCC)    Hyperlipidemia    Hypertension    Mixed hyperlipidemia due to type 2 diabetes mellitus (HCC) 07/31/2019   Nicotine dependence, cigarettes, uncomplicated 07/31/2019   PAD (peripheral artery disease) (HCC)    Pancreatitis    PTSD (post-traumatic stress disorder)    Uncontrolled type 2 diabetes mellitus with diabetic polyneuropathy, with long-term current use of insulin 07/31/2019    Current Outpatient Medications on File Prior to Visit  Medication Sig Dispense Refill   acetaminophen (  TYLENOL) 500 MG tablet Take 1,000 mg by mouth 2 (two) times daily as needed for mild pain.     aspirin 81 MG tablet Take 81 mg by mouth daily.     atorvastatin (LIPITOR) 80 MG tablet Take 80 mg by mouth at bedtime.     buPROPion (WELLBUTRIN SR) 150 MG 12 hr tablet Take 150 mg by mouth in the morning and at bedtime.     carvedilol (COREG) 25 MG tablet Take 12.5 mg by mouth 2 (two) times daily with a meal.     empagliflozin (JARDIANCE) 25 MG TABS tablet Take 1 tablet (25 mg total) by mouth daily. 30 tablet 0   ezetimibe (ZETIA) 10 MG tablet Take 1 tablet (10 mg total) by mouth daily. 30 tablet 0   folic acid (FOLVITE) 1 MG tablet Take 1 tablet (1 mg total) by mouth daily.     furosemide (LASIX) 20 MG tablet  Take 10 mg by mouth daily.     gabapentin (NEURONTIN) 300 MG capsule Take 300-600 mg by mouth See admin instructions. Take 300 mg morning and noon. Take 600 mg at bedtime     insulin aspart (NOVOLOG) 100 UNIT/ML injection Inject 10 Units into the skin 3 (three) times daily with meals. 10 mL 11   insulin glargine (LANTUS) 100 UNIT/ML Solostar Pen 30units in am and 35unit at night  Further adjustment per your pcp and endocrinology Goal of a1c is less than 7%, currently your a1c is 10%, not at goal (Patient taking differently: 30-35 Units See admin instructions. 30units in am and 35unit at night  Further adjustment per your pcp and endocrinology Goal of a1c is less than 7%, currently your a1c is 10%, not at goal) 15 mL 0   Insulin Pen Needle (NOVOFINE) 30G X 8 MM MISC Inject 10 each into the skin as needed. 100 each 0   isosorbide mononitrate (IMDUR) 60 MG 24 hr tablet Take 1 tablet (60 mg total) by mouth daily. 30 tablet 0   lipase/protease/amylase (CREON) 36000 UNITS CPEP capsule Take 1 capsule (36,000 Units total) by mouth 3 (three) times daily before meals. 270 capsule 0   losartan (COZAAR) 25 MG tablet Take 0.5 tablets (12.5 mg total) by mouth daily. 30 tablet 0   lurasidone (LATUDA) 40 MG TABS tablet Take 40 mg by mouth every evening.     magnesium oxide (MAG-OX) 400 (241.3 Mg) MG tablet Take 1 tablet (400 mg total) by mouth 2 (two) times daily. (Patient taking differently: Take 400 mg by mouth daily.)     mesalamine (LIALDA) 1.2 g EC tablet Take 1.2 g by mouth daily.     naloxone (NARCAN) nasal spray 4 mg/0.1 mL Place 1 spray into the nose as needed (opioid reversal).     nitroGLYCERIN (NITROSTAT) 0.4 MG SL tablet Place 1 tablet (0.4 mg total) under the tongue every 5 (five) minutes as needed for chest pain. 30 tablet 0   pantoprazole (PROTONIX) 40 MG tablet Take 40 mg by mouth daily.     polyethylene glycol (MIRALAX / GLYCOLAX) 17 g packet Take 17 g by mouth daily as needed for mild  constipation. 14 each 0   sertraline (ZOLOFT) 100 MG tablet Take 100 mg by mouth daily.     tamsulosin (FLOMAX) 0.4 MG CAPS capsule Take 0.4 mg by mouth daily.     thiamine 100 MG tablet Take 1 tablet (100 mg total) by mouth daily.     ticagrelor (BRILINTA) 90 MG TABS tablet Take  1 tablet (90 mg total) by mouth 2 (two) times daily. 60 tablet 0   vitamin B-12 (CYANOCOBALAMIN) 500 MCG tablet Take 500 mcg by mouth daily.     No current facility-administered medications on file prior to visit.    Allergies  Allergen Reactions   Iohexol Hives    Patient broke out in hives after injection of Omni 300, will need 13 hour pre-med in future   Semaglutide Other (See Comments)    Pancreatitis Ozempic   Shellfish Allergy Hives   Lac Bovis Diarrhea    Finding of gastrointestinal tract gas   Lisinopril Cough   Simvastatin Itching and Cough     Assessment/Plan:  1. CHF - Paitent appears to be in heart failure, fluid overloaded. He is hypotensive. I have asked him to STOP amlodipine. After discussion with Dr. Shari Prows, patient will also STOP losartan for now. Decrease carvedilol to 6.25mg  BID (patient thinks he can 1/4 the tablets). Decreasing and stopping BP medications to allow for more BP room in order to diuresis patient. Tomorrow patient will increase furosemide to 40mg  daily. Patient will be seen by APP tomorrow. I have asked him to bring in all the medications he is taking so we can make sure his medication list is correct. I have sent prescriptions to the [facility] for the medications he is out of. Also given written Rx as well. I gave him 8 days worth of Brilinta samples and reminded him that it is important not to run out of this medication. Follow up with APP tomorrow.    [facility], Pharm.D, BCPS, CPP  [facility]  [address], [place], [place]  Phone: [phone]; Fax: [phone]

## 2021-08-30 NOTE — Patient Instructions (Addendum)
STOP taking amlodipine STOP losartan Decrease carvedilol to 6.25mg  twice a day   TOMORROW Please start taking 2 tablets (40mg ) of furosemide daily  Please bring ALL your medication bottle with you to your appointment tomorrow

## 2021-08-31 ENCOUNTER — Ambulatory Visit (INDEPENDENT_AMBULATORY_CARE_PROVIDER_SITE_OTHER): Payer: No Typology Code available for payment source | Admitting: Physician Assistant

## 2021-08-31 ENCOUNTER — Encounter: Payer: Self-pay | Admitting: Physician Assistant

## 2021-08-31 VITALS — BP 88/58 | HR 105 | Ht 68.0 in | Wt 233.6 lb

## 2021-08-31 DIAGNOSIS — E782 Mixed hyperlipidemia: Secondary | ICD-10-CM

## 2021-08-31 DIAGNOSIS — E1169 Type 2 diabetes mellitus with other specified complication: Secondary | ICD-10-CM

## 2021-08-31 DIAGNOSIS — I25110 Atherosclerotic heart disease of native coronary artery with unstable angina pectoris: Secondary | ICD-10-CM | POA: Diagnosis not present

## 2021-08-31 DIAGNOSIS — I739 Peripheral vascular disease, unspecified: Secondary | ICD-10-CM

## 2021-08-31 DIAGNOSIS — I255 Ischemic cardiomyopathy: Secondary | ICD-10-CM

## 2021-08-31 DIAGNOSIS — I10 Essential (primary) hypertension: Secondary | ICD-10-CM

## 2021-08-31 DIAGNOSIS — I5020 Unspecified systolic (congestive) heart failure: Secondary | ICD-10-CM

## 2021-08-31 LAB — BASIC METABOLIC PANEL
BUN/Creatinine Ratio: 21 (ref 10–24)
BUN: 16 mg/dL (ref 8–27)
CO2: 16 mmol/L — ABNORMAL LOW (ref 20–29)
Calcium: 9 mg/dL (ref 8.6–10.2)
Chloride: 102 mmol/L (ref 96–106)
Creatinine, Ser: 0.78 mg/dL (ref 0.76–1.27)
Glucose: 199 mg/dL — ABNORMAL HIGH (ref 70–99)
Potassium: 4.5 mmol/L (ref 3.5–5.2)
Sodium: 136 mmol/L (ref 134–144)
eGFR: 101 mL/min/{1.73_m2} (ref >59)

## 2021-08-31 MED ORDER — POTASSIUM CHLORIDE CRYS ER 20 MEQ PO TBCR: 20.0000 meq | EXTENDED_RELEASE_TABLET | Freq: Every day | ORAL | 1 refills | Status: AC

## 2021-08-31 MED ORDER — CARVEDILOL 6.25 MG PO TABS: 6.2500 mg | ORAL_TABLET | Freq: Two times a day (BID) | ORAL | 1 refills | Status: DC

## 2021-08-31 MED ORDER — FUROSEMIDE 40 MG PO TABS: ORAL_TABLET | ORAL | 0 refills | Status: DC

## 2021-08-31 NOTE — Progress Notes (Signed)
Cardiology Office Note:    Date:  08/31/2021   ID:  Ricardo Howard, DOB April 01, 1960, MRN [id number]  PCP:  [facility]  [facility] Cardiologist:  Charlton Haws, MD  [facility] Electrophysiologist:  None   Chief Complaint: medication management   History of Present Illness:    Ricardo Howard is a 61 y.o. male with a hx of CAD, hypertension, hyperlipidemia, diabetes, peripheral vascular disease s/p right BKA, obstructive sleep apnea on CPAP, chronic combined CHF, alcohol and tobacco use seen for close follow-up.  Admitted 06/2021 for NSTEMI. Cath showed High-grade calcified proximal right coronary artery lesion treated with atherectomy and 1 drug-eluting stent with patent previously placed mid to distal right coronary artery stent and proximal left circumflex stent, the latter with moderate in-stent restenosis. Placed on DAPT with Brilinta/ASA indefinitely give the length of stent and number. Does have moderate disease in the pLAD and LCx to be treated medically.  LVEF of 45-50% with septal hypokinesis mid/basal wall akinesis.  He missed post hospital follow-up appointment.  Seen by pharmacist yesterday for heart failure medication management. He was out of atorvastatin, ezetimibe, Jardiance, B12, Brilinta, Latuda and Lialda for the past 4 days. + SOB, orthopnea and weigh gain. He was hypotensive. Stopped amlodipine. Reduced coreg to 6.25mg BID ( 1/4 tablet). Increase Lasix to 40mg  qd. Given 8 days sample of brillinta.   Here for follow-up with wife.  He brought all of his medication.  He lost 3 pounds on higher dose of Lasix.  Breathing minimally improved.  Still has orthopnea abdominal swelling and left lower extremity edema.  Kidney function stable on blood work.  [facility].  He reports using multiple pillows at night.  Has some fluttering sensation.  No bleeding issue.  Past Medical History:  Diagnosis Date   Alcohol abuse 06/14/2019    Anxiety    Back pain    Cataracts, bilateral    CHF (congestive heart failure) (HCC)    Depression    Diabetic retinopathy (HCC)    Essential hypertension 06/14/2019   Hepatitis C    cured   Hx of BKA, right (HCC)    Hyperlipidemia    Hypertension    Mixed hyperlipidemia due to type 2 diabetes mellitus (HCC) 07/31/2019   Nicotine dependence, cigarettes, uncomplicated 07/31/2019   PAD (peripheral artery disease) (HCC)    Pancreatitis    PTSD (post-traumatic stress disorder)    Uncontrolled type 2 diabetes mellitus with diabetic polyneuropathy, with long-term current use of insulin 07/31/2019    Past Surgical History:  Procedure Laterality Date   ABDOMINAL AORTOGRAM W/LOWER EXTREMITY N/A 12/02/2020   Procedure: ABDOMINAL AORTOGRAM W/LOWER EXTREMITY;  Surgeon: 12/04/2020, MD;  Location: [facility] INVASIVE [facility] LAB;  Service: Cardiovascular;  Laterality: N/A;   AMPUTATION Right 03/13/2020   Procedure: TRANSMETATARSAL AMPUTATION;  Surgeon: 03/15/2020, MD;  Location: [facility] OR;  Service: Vascular;  Laterality: Right;   AMPUTATION Right 03/15/2020   Procedure: RIGHT BELOW KNEE AMPUTATION;  Surgeon: 03/17/2020, MD;  Location: [facility] OR;  Service: Vascular;  Laterality: Right;   CORONARY ANGIOPLASTY WITH STENT PLACEMENT     CORONARY STENT INTERVENTION N/A 07/16/2021   Procedure: CORONARY STENT INTERVENTION;  Surgeon: 07/18/2021, MD;  Location: [facility] INVASIVE CV LAB;  Service: Cardiovascular;  Laterality: N/A;   LEFT HEART CATH AND CORONARY ANGIOGRAPHY N/A 07/16/2021   Procedure: LEFT HEART CATH AND CORONARY ANGIOGRAPHY;  Surgeon: 07/18/2021, MD;  Location: [facility]  INVASIVE CV LAB;  Service: Cardiovascular;  Laterality: N/A;   PERIPHERAL VASCULAR INTERVENTION  12/02/2020   Procedure: PERIPHERAL VASCULAR INTERVENTION;  Surgeon: Cherre Robins, MD;  Location: [facility] CV LAB;  Service: Cardiovascular;;  Lt SFA, LEIA    Current Medications: Current Meds  Medication Sig    acetaminophen (TYLENOL) 500 MG tablet Take 1,000 mg by mouth 2 (two) times daily as needed for mild pain.   aspirin 81 MG tablet Take 81 mg by mouth daily.   atorvastatin (LIPITOR) 80 MG tablet Take 1 tablet (80 mg total) by mouth at bedtime.   buPROPion (WELLBUTRIN SR) 150 MG 12 hr tablet Take 150 mg by mouth in the morning and at bedtime.   clopidogrel (PLAVIX) 75 MG tablet Take 75 mg by mouth daily.   empagliflozin (JARDIANCE) 25 MG TABS tablet Take 1 tablet (25 mg total) by mouth daily.   ezetimibe (ZETIA) 10 MG tablet Take 1 tablet (10 mg total) by mouth daily.   folic acid (FOLVITE) 1 MG tablet Take 1 tablet (1 mg total) by mouth daily.   gabapentin (NEURONTIN) 300 MG capsule Take 300-600 mg by mouth See admin instructions. Take 300 mg morning and noon. Take 600 mg at bedtime   insulin aspart (NOVOLOG) 100 UNIT/ML injection Inject 10 Units into the skin 3 (three) times daily with meals.   insulin glargine (LANTUS) 100 UNIT/ML Solostar Pen 30units in am and 35unit at night  Further adjustment per your pcp and endocrinology Goal of a1c is less than 7%, currently your a1c is 10%, not at goal (Patient taking differently: 30-35 Units See admin instructions. 30units in am and 35unit at night  Further adjustment per your pcp and endocrinology Goal of a1c is less than 7%, currently your a1c is 10%, not at goal)   Insulin Pen Needle (NOVOFINE) 30G X 8 MM MISC Inject 10 each into the skin as needed.   isosorbide mononitrate (IMDUR) 60 MG 24 hr tablet Take 30 mg by mouth daily.   lipase/protease/amylase (CREON) 36000 UNITS CPEP capsule Take 1 capsule (36,000 Units total) by mouth 3 (three) times daily before meals.   lurasidone (LATUDA) 40 MG TABS tablet Take 40 mg by mouth every evening.   magnesium oxide (MAG-OX) 400 (241.3 Mg) MG tablet Take 1 tablet (400 mg total) by mouth 2 (two) times daily. (Patient taking differently: Take 400 mg by mouth daily.)   mesalamine (LIALDA) 1.2 g EC tablet Take 1.2  g by mouth daily.   naloxone (NARCAN) nasal spray 4 mg/0.1 mL Place 1 spray into the nose as needed (opioid reversal).   nitroGLYCERIN (NITROSTAT) 0.4 MG SL tablet Place 1 tablet (0.4 mg total) under the tongue every 5 (five) minutes as needed for chest pain.   pantoprazole (PROTONIX) 40 MG tablet Take 40 mg by mouth daily.   polyethylene glycol (MIRALAX / GLYCOLAX) 17 g packet Take 17 g by mouth daily as needed for mild constipation.   pregabalin (LYRICA) 150 MG capsule Take 150 mg by mouth 2 (two) times daily.   sertraline (ZOLOFT) 100 MG tablet Take 100 mg by mouth daily.   tamsulosin (FLOMAX) 0.4 MG CAPS capsule Take 0.4 mg by mouth daily.   thiamine 100 MG tablet Take 1 tablet (100 mg total) by mouth daily.   vitamin B-12 (CYANOCOBALAMIN) 500 MCG tablet Take 500 mcg by mouth daily.   [DISCONTINUED] carvedilol (COREG) 6.25 MG tablet Take 6.25 mg by mouth 2 (two) times daily with a meal.   [  DISCONTINUED] furosemide (LASIX) 40 MG tablet Take 1 tablet twice a day for 4 days then take 1 tablet once a day   [DISCONTINUED] isosorbide mononitrate (IMDUR) 60 MG 24 hr tablet Take 1 tablet (60 mg total) by mouth daily. (Patient taking differently: Take 30 mg by mouth daily.)   [DISCONTINUED] potassium chloride SA (KLOR-CON M) 20 MEQ tablet Take 20 mEq by mouth daily.     Allergies:   Iohexol, Semaglutide, Shellfish allergy, Lac bovis, Lisinopril, and Simvastatin   Social History   Socioeconomic History   Marital status: Married    Spouse name: Not on file   Number of children: Not on file   Years of education: Not on file   Highest education level: Not on file  Occupational History   Not on file  Tobacco Use   Smoking status: Every Day    Packs/day: 0.25    Types: Cigarettes   Smokeless tobacco: Never  Vaping Use   Vaping Use: Never used  Substance and Sexual Activity   Alcohol use: Yes    Comment: etoh abuse   Drug use: Not Currently   Sexual activity: Not on file  Other Topics  Concern   Not on file  Social History Narrative   Not on file   Social Determinants of Health   Financial Resource Strain: Not on file  Food Insecurity: Not on file  Transportation Needs: Not on file  Physical Activity: Not on file  Stress: Not on file  Social Connections: Not on file     Family History: The patient's family history is negative for Other.    ROS:   Please see the history of present illness.    All other systems reviewed and are negative.   EKGs/Labs/Other Studies Reviewed:    The following studies were reviewed today:  Echo 07/16/21:   1. Septal hypokinesis mid/basal wall akinesis . Left ventricular ejection  fraction, by estimation, is 45 to 50%. The left ventricle has mildly  decreased function. The left ventricle demonstrates regional wall motion  abnormalities (see scoring  diagram/findings for description). The left ventricular internal cavity  size was mildly dilated. There is mild left ventricular hypertrophy.   2. Right ventricular systolic function is normal. The right ventricular  size is normal.   3. The mitral valve is normal in structure. No evidence of mitral valve  regurgitation.   4. The aortic valve is tricuspid. There is moderate calcification of the  aortic valve. There is mild thickening of the aortic valve. Aortic valve  sclerosis/calcification is present, without any evidence of aortic  stenosis.    Cardiac cath 07/16/21:      Mid RCA lesion is 20% stenosed.   Prox RCA lesion is 95% stenosed.   Prox Cx lesion is 40% stenosed.   Mid LAD lesion is 50% stenosed.   A stent was successfully placed.   Post intervention, there is a 0% residual stenosis.   LV end diastolic pressure is mildly elevated.   1.  High-grade calcified proximal right coronary artery lesion treated with atherectomy and 1 drug-eluting stent with patent previously placed mid to distal right coronary artery stent and proximal left circumflex stent, the latter with  moderate in-stent restenosis. 2.  Very focal mid moderate LAD lesion. 3.  LVEDP of 19 mmHg.   Recommendation: Dual antiplatelet therapy for 1 year.   Dominance: Right Intervention         EKG:  EKG is  ordered today.  The ekg ordered  today demonstrates sinus tachycardia  Recent Labs: 07/16/2021: ALT 42; B Natriuretic Peptide 56.4; TSH 0.580 07/18/2021: Hemoglobin 13.5; Magnesium 2.0; Platelets 116 08/30/2021: BUN 16; Creatinine, Ser 0.78; Potassium 4.5; Sodium 136  Recent Lipid Panel    Component Value Date/Time   CHOL 249 (H) 07/16/2021 1448   TRIG 58 07/16/2021 1448   HDL 44 07/16/2021 1448   CHOLHDL 5.7 07/16/2021 1448   VLDL 12 07/16/2021 1448   LDLCALC 193 (H) 07/16/2021 1448     Physical Exam:    VS:  BP (!) 88/58   Pulse (!) 105   Ht 5\' 8"  (1.727 m)   Wt 233 lb 9.6 oz (106 kg)   SpO2 96%   BMI 35.52 kg/m     Wt Readings from Last 3 Encounters:  08/31/21 233 lb 9.6 oz (106 kg)  08/30/21 236 lb (107 kg)  07/17/21 223 lb 15.8 oz (101.6 kg)     GEN:  Well nourished, well developed in no acute distress HEENT: Normal NECK: No JVD; No carotid bruits LYMPHATICS: No lymphadenopathy CARDIAC: Regular tachycardic, no murmurs, rubs, gallops RESPIRATORY: Diminished breath sounds with rales  ABDOMEN: Soft, non-tender, + distended MUSCULOSKELETAL: Right BKA, 1+ lower extremity edema on left side  SKIN: Warm and dry NEUROLOGIC:  Alert and oriented x 3 PSYCHIATRIC:  Normal affect   ASSESSMENT AND PLAN:    CAD - cath 06/2021 High-grade calcified proximal right coronary artery lesion treated with atherectomy and 1 drug-eluting stent with patent previously placed mid to distal right coronary artery stent and proximal left circumflex stent, the latter with moderate in-stent restenosis -Continue aspirin -He will continue Plav[facility] which [facility] (stop Brilinta sample) -Continue Lipitor and Zetia - Continue Imdur 30 mg daily -Continue carvedilol 6.25 mg  daily  2. ICM/acute on chronic combined heart failure - Echo with LVEF of 45-50% with septal hypokinesis mid/basal wall akinesis -Significant volume overload on exam.  Renal function normal. -Increase Lasix to 40 mg twice daily for 4 days then 40 mg daily -Continue carvedilol 6.25 mg twice daily -Continue to hold losartan -Continue Jardiance -Added potassium supplement with increase Lasix -Close follow-up next week with repeat blood work -He was noncompliant with salt intake.  At length discussion regarding salt and water management.  3. HTN -Blood pressure soft -Continue to hold losartan and lisinopril as advised yesterday -Continue reduced dose of carvedilol at 6.25 mg twice daily -Continue Imdur 30 mg daily  4. HLD -Continue Zetia and epidural  5. PAD s/p RBKA  6. OSA on CPAP  7. Tobacco/Alcohol use  8.  Sinus tachycardia Patient reports intermittent palpitation.  Sinus tachycardic on EKG today.  Likely elevated heart rate due to significant volume overload.  If ongoing symptoms with adequate diuresis, consider monitor.  Medication Adjustments/Labs and Tests Ordered: Current medicines are reviewed at length with the patient today.  Concerns regarding medicines are outlined above.  Orders Placed This Encounter  Procedures   EKG 12-Lead   Meds ordered this encounter  Medications   carvedilol (COREG) 6.25 MG tablet    Sig: Take 1 tablet (6.25 mg total) by mouth 2 (two) times daily with a meal.    Dispense:  90 tablet    Refill:  1   furosemide (LASIX) 40 MG tablet    Sig: Take 1 tablet (40 mg total) by mouth 2 (two) times daily for 4 days, THEN 1 tablet (40 mg total) daily.    Dispense:  94 tablet    Refill:  0   potassium chloride SA (KLOR-CON M) 20 MEQ tablet    Sig: Take 1 tablet (20 mEq total) by mouth daily.    Dispense:  90 tablet    Refill:  1    Patient Instructions  Medication Instructions:  START Potassium 79meq Take 1 tablet once a day  INCREASE  lasix to 40mg  take 1 tablet twice a day for 4 days then take 1 tablet daily  New prescription for Coreg has been sent to the pharmacy. Take 6.25mg  Tablet twice a day  *If you need a refill on your cardiac medications before your next appointment, please call your pharmacy*   Lab Work: None Ordered If you have labs (blood work) drawn today and your tests are completely normal, you will receive your results only by: Theodore (if you have MyChart) OR A paper copy in the mail If you have any lab test that is abnormal or we need to change your treatment, we will call you to review the results.   Testing/Procedures: None Ordered   Follow-Up: At [facility], you and your health needs are our priority.  As part of our continuing mission to provide you with exceptional heart care, we have created designated Provider Care Teams.  These Care Teams include your primary Cardiologist (physician) and Advanced Practice Providers (APPs -  Physician Assistants and Nurse Practitioners) who all work together to provide you with the care you need, when you need it.  We recommend signing up for the patient portal called "MyChart".  Sign up information is provided on this After Visit Summary.  MyChart is used to connect with patients for Virtual Visits (Telemedicine).  Patients are able to view lab/test results, encounter notes, upcoming appointments, etc.  Non-urgent messages can be sent to your provider as well.   To learn more about what you can do with MyChart, go to [email or web address].    Your next appointment:   1 week(s)  The format for your next appointment:   In Person  Provider:   ANY APP IN ANY SLOT PER VB     Other Instructions   Important Information About Sugar         Jarrett Soho, PA  08/31/2021 4:12 PM    [facility]

## 2021-08-31 NOTE — Patient Instructions (Signed)
Medication Instructions:  START Potassium Take 1 tablet once a day  INCREASE lasix to 40mg  take 1 tablet twice a day for 4 days then take 1 tablet daily  New prescription for Coreg has been sent to the pharmacy. Take 6.25mg  Tablet twice a day  *If you need a refill on your cardiac medications before your next appointment, please call your pharmacy*   Lab Work: None Ordered If you have labs (blood work) drawn today and your tests are completely normal, you will receive your results only by: MyChart Message (if you have MyChart) OR A paper copy in the mail If you have any lab test that is abnormal or we need to change your treatment, we will call you to review the results.   Testing/Procedures: None Ordered   Follow-Up: At [facility], you and your health needs are our priority.  As part of our continuing mission to provide you with exceptional heart care, we have created designated Provider Care Teams.  These Care Teams include your primary Cardiologist (physician) and Advanced Practice Providers (APPs -  Physician Assistants and Nurse Practitioners) who all work together to provide you with the care you need, when you need it.  We recommend signing up for the patient portal called "MyChart".  Sign up information is provided on this After Visit Summary.  MyChart is used to connect with patients for Virtual Visits (Telemedicine).  Patients are able to view lab/test results, encounter notes, upcoming appointments, etc.  Non-urgent messages can be sent to your provider as well.   To learn more about what you can do with MyChart, go to [name].    Your next appointment:   1 week(s)  The format for your next appointment:   In Person  Provider:   ANY APP IN ANY SLOT PER VB     Other Instructions   Important Information About Sugar

## 2021-09-05 NOTE — Progress Notes (Signed)
Office Visit    Patient Name: Ricardo Howard Date of Encounter: 09/07/2021  PCP:  Clinic, [facility]  Cardiologist:  Jenkins Rouge, MD  Advanced Practice Provider:  No care team member to display Electrophysiologist:  None  HPI    Ricardo Howard is a 61 y.o. male with a hx of CAD, hypertension, hyperlipidemia, diabetes, peripheral vascular disease status post right BKA, obstructive sleep apnea on CPAP, chronic combined CHF, alcohol and tobacco use presents today for follow-up appointment.  Patient was admitted 06/2021 for NSTEMI.  Cardiac cath showed high-grade calcified proximal right coronary artery lesion treated with arthrectomy and a drug-eluting stent with patent previously placed mid to distal right coronary artery stent and proximal left circumflex stent, the latter with moderate in-stent restenosis.  Placed on DAPT with Brilinta/ASA indefinitely.  Does have moderate disease in the proximal LAD and left circumflex to be treated medically.  LVEF 45 to 50% with septal hypokinesis mid/basal wall akinesis.  He was seen by Robbie Lis, PA 08/31/2021.  He had lost 3 pounds on a higher dose of Lasix.  His breathing had minimally improved.  He still had some orthopnea, abdominal swelling, lower extremity edema.  His kidney function was stable on blood work.  His Brilinta was changed to Plavix by the [place].  He reported using multiple pillows at night.  He has some fluttering sensations.  No issues with bleeding.   Today, he feels pretty good. He did have one episode of sharp left-sided chest pain last night that lasted for maybe 30 minutes.  He did not require any nitro.  The pain resolved on its own and he went back to sleep.  He continues to be short of breath.  Last echocardiogram showed EF 40 to 45%.  We have been working on maximizing heart failure treatment but have been limited due to hypotension.  He has slowly been increasing his activity level  and now is able to walk around his entire home which takes about 25 minutes.  We discussed cardiac rehab and he is interested in pursuing this.  Reports no shortness of breath nor dyspnea on exertion. Reports no chest pain, pressure, or tightness. No edema, orthopnea, PND. Reports no palpitations.    Past Medical History    Past Medical History:  Diagnosis Date   Alcohol abuse 06/14/2019   Anxiety    Back pain    Cataracts, bilateral    CHF (congestive heart failure) (HCC)    Depression    Diabetic retinopathy ([place])    Essential hypertension 06/14/2019   Hepatitis C    cured   Hx of BKA, right ([place])    Hyperlipidemia    Hypertension    Mixed hyperlipidemia due to type 2 diabetes mellitus ([place]) 07/31/2019   Nicotine dependence, cigarettes, uncomplicated [id number]   PAD (peripheral artery disease) ([place])    Pancreatitis    PTSD (post-traumatic stress disorder)    Uncontrolled type 2 diabetes mellitus with diabetic polyneuropathy, with long-term current use of insulin 07/31/2019   Past Surgical History:  Procedure Laterality Date   ABDOMINAL AORTOGRAM W/LOWER EXTREMITY N/A 12/02/2020   Procedure: ABDOMINAL AORTOGRAM W/LOWER EXTREMITY;  Surgeon: Cherre Robins, MD;  Location: [facility];  Service: Cardiovascular;  Laterality: N/A;   AMPUTATION Right 03/13/2020   Procedure: TRANSMETATARSAL AMPUTATION;  Surgeon: Angelia Mould, MD;  Location: [facility];  Service: Vascular;  Laterality: Right;   AMPUTATION Right 03/15/2020  Procedure: RIGHT BELOW KNEE AMPUTATION;  Surgeon: Chuck Hint, MD;  Location: [facility];  Service: Vascular;  Laterality: Right;   CORONARY ANGIOPLASTY WITH STENT PLACEMENT     CORONARY STENT INTERVENTION N/A 07/16/2021   Procedure: CORONARY STENT INTERVENTION;  Surgeon: Orbie Pyo, MD;  Location: [facility];  Service: Cardiovascular;  Laterality: N/A;   LEFT HEART CATH AND CORONARY ANGIOGRAPHY N/A 07/16/2021   Procedure: LEFT HEART  CATH AND CORONARY ANGIOGRAPHY;  Surgeon: Orbie Pyo, MD;  Location: [facility];  Service: Cardiovascular;  Laterality: N/A;   PERIPHERAL VASCULAR INTERVENTION  12/02/2020   Procedure: PERIPHERAL VASCULAR INTERVENTION;  Surgeon: Leonie Douglas, MD;  Location: [facility];  Service: Cardiovascular;;  Lt SFA, LEIA    Allergies  Allergies  Allergen Reactions   Iohexol Hives    Patient broke out in hives after injection of Omni 300, will need 13 hour pre-med in future   Semaglutide Other (See Comments)    Pancreatitis Ozempic   Shellfish Allergy Hives   Lisinopril Cough   Milk (Cow) Diarrhea    Finding of gastrointestinal tract gas   Simvastatin Itching and Cough    EKGs/Labs/Other Studies Reviewed:   The following studies were reviewed today:  Cardiac Cath 07/16/2021   Mid RCA lesion is 20% stenosed.   Prox RCA lesion is 95% stenosed.   Prox Cx lesion is 40% stenosed.   Mid LAD lesion is 50% stenosed.   A stent was successfully placed.   Post intervention, there is a 0% residual stenosis.   LV end diastolic pressure is mildly elevated.   1.  High-grade calcified proximal right coronary artery lesion treated with atherectomy and 1 drug-eluting stent with patent previously placed mid to distal right coronary artery stent and proximal left circumflex stent, the latter with moderate in-stent restenosis. 2.  Very focal mid moderate LAD lesion. 3.  LVEDP of 19 mmHg.   Recommendation: Dual antiplatelet therapy for 1 year.  EKG:  EKG is not ordered today.  Recent Labs: 07/16/2021: ALT 42; B Natriuretic Peptide 56.4; TSH 0.580 07/18/2021: Hemoglobin 13.5; Magnesium 2.0; Platelets 116 08/30/2021: BUN 16; Creatinine, Ser 0.78; Potassium 4.5; Sodium 136  Recent Lipid Panel    Component Value Date/Time   CHOL 249 (H) 07/16/2021 1448   TRIG 58 07/16/2021 1448   HDL 44 07/16/2021 1448   CHOLHDL 5.7 07/16/2021 1448   VLDL 12 07/16/2021 1448   LDLCALC 193 (H)  07/16/2021 1448     Home Medications   Current Meds  Medication Sig   acetaminophen (TYLENOL) 500 MG tablet Take 1,000 mg by mouth 2 (two) times daily as needed for mild pain.   aspirin 81 MG tablet Take 81 mg by mouth daily.   atorvastatin (LIPITOR) 80 MG tablet Take 1 tablet (80 mg total) by mouth at bedtime.   buPROPion (WELLBUTRIN SR) 150 MG 12 hr tablet Take 150 mg by mouth in the morning and at bedtime.   carvedilol (COREG) 6.25 MG tablet Take 1 tablet (6.25 mg total) by mouth 2 (two) times daily with a meal.   clopidogrel (PLAVIX) 75 MG tablet Take 75 mg by mouth daily.   empagliflozin (JARDIANCE) 25 MG TABS tablet Take 1 tablet (25 mg total) by mouth daily.   ezetimibe (ZETIA) 10 MG tablet Take 1 tablet (10 mg total) by mouth daily.   folic acid (FOLVITE) 1 MG tablet Take 1 tablet (1 mg total) by mouth daily.   furosemide (  LASIX) 40 MG tablet Take 1 tablet (40 mg total) by mouth 2 (two) times daily for 4 days, THEN 1 tablet (40 mg total) daily.   gabapentin (NEURONTIN) 300 MG capsule Take 300-600 mg by mouth See admin instructions. Take 300 mg morning and noon. Take 600 mg at bedtime   insulin aspart (NOVOLOG) 100 UNIT/ML injection Inject 10 Units into the skin 3 (three) times daily with meals.   insulin glargine (LANTUS) 100 UNIT/ML Solostar Pen 30units in am and 35unit at night  Further adjustment per your pcp and endocrinology Goal of a1c is less than 7%, currently your a1c is 10%, not at goal (Patient taking differently: 30-35 Units See admin instructions. 30units in am and 35unit at night  Further adjustment per your pcp and endocrinology Goal of a1c is less than 7%, currently your a1c is 10%, not at goal)   Insulin Pen Needle (NOVOFINE) 30G X 8 MM MISC Inject 10 each into the skin as needed.   isosorbide mononitrate (IMDUR) 60 MG 24 hr tablet Take 30 mg by mouth daily.   lipase/protease/amylase (CREON) 36000 UNITS CPEP capsule Take 1 capsule (36,000 Units total) by mouth 3  (three) times daily before meals.   lurasidone (LATUDA) 40 MG TABS tablet Take 40 mg by mouth every evening.   magnesium oxide (MAG-OX) 400 (241.3 Mg) MG tablet Take 1 tablet (400 mg total) by mouth 2 (two) times daily. (Patient taking differently: Take 400 mg by mouth daily.)   mesalamine (LIALDA) 1.2 g EC tablet Take 1.2 g by mouth daily.   naloxone (NARCAN) nasal spray 4 mg/0.1 mL Place 1 spray into the nose as needed (opioid reversal).   nitroGLYCERIN (NITROSTAT) 0.4 MG SL tablet Place 1 tablet (0.4 mg total) under the tongue every 5 (five) minutes as needed for chest pain.   pantoprazole (PROTONIX) 40 MG tablet Take 40 mg by mouth daily.   polyethylene glycol (MIRALAX / GLYCOLAX) 17 g packet Take 17 g by mouth daily as needed for mild constipation.   potassium chloride SA (KLOR-CON M) 20 MEQ tablet Take 1 tablet (20 mEq total) by mouth daily.   pregabalin (LYRICA) 150 MG capsule Take 150 mg by mouth 2 (two) times daily.   sacubitril-valsartan (ENTRESTO) 24-26 MG Take 1 tablet by mouth 2 (two) times daily.   sacubitril-valsartan (ENTRESTO) 24-26 MG Take 1 tablet by mouth 2 (two) times daily.   sertraline (ZOLOFT) 100 MG tablet Take 100 mg by mouth daily.   tamsulosin (FLOMAX) 0.4 MG CAPS capsule Take 0.4 mg by mouth daily.   thiamine 100 MG tablet Take 1 tablet (100 mg total) by mouth daily.   vitamin B-12 (CYANOCOBALAMIN) 500 MCG tablet Take 500 mcg by mouth daily.     Review of Systems      All other systems reviewed and are otherwise negative except as noted above.  Physical Exam    VS:  BP 134/80   Pulse 94   Ht 5\' 8"  (1.727 m)   Wt 231 lb 3.2 oz (104.9 kg)   SpO2 96%   BMI 35.15 kg/m  , BMI Body mass index is 35.15 kg/m.  Wt Readings from Last 3 Encounters:  09/07/21 231 lb 3.2 oz (104.9 kg)  08/31/21 233 lb 9.6 oz (106 kg)  08/30/21 236 lb (107 kg)     GEN: Well nourished, well developed, in no acute distress. HEENT: normal. Neck: Supple, no JVD, carotid bruits, or  masses. Cardiac: RRR, no murmurs, rubs, or gallops. No  clubbing, cyanosis, edema.  Radials/PT 2+ and equal bilaterally.  Respiratory:  Respirations regular and unlabored, clear to auscultation bilaterally. GI: Soft, nontender, nondistended. MS: No deformity or atrophy. Skin: Warm and dry, no rash. Neuro:  Strength and sensation are intact. Psych: Normal affect.  Assessment & Plan    CAD status post PCI -One episode of CP occurred last night, sharp pain on the left side, did not need nitro -continue ASA 81 mg, Plavix 75 mg, Lipitor 80 mg, Zetia 10 mg, Imdur 30 mg daily, carvedilol 6.25 mg daily  ICM/acute on chronic combined heart failure -today he is euvolemic on exam  -Echo with LVEF 45 to 50% with septal hypokinesis mid/basal wall akinesis -Diuretics recently increased to 40 mg of Lasix twice a day -Continue GDMT: Carvedilol 6.25 mg twice daily, Jardiance, Lasix 40mg  now daily -Heart failure treatment somewhat limited due to blood pressure-start Entresto since BP is 134/80 today. -Check LFTs next appointment  Hypertension -Well controlled on current regimen -Please monitor BP daily since we are starting Entresto  Hyperlipidemia -continue current medications LDL 193-goal < 70 -Will plan to re-draw LDL at next visit  PAD status post right BKA  OSA on CPAP -He plans to get his CPAP next month-he has bee waiting since January [facility] the [facility]  Alcohol/tobacco use    Cardiac Rehabilitation Eligibility Assessment  The patient is ready to start cardiac rehabilitation from a cardiac standpoint.    Disposition: Follow up 3 months with [facility], MD or APP.  Signed, Charlton Haws, PA-C 09/07/2021, 8:46 AM [facility]

## 2021-09-07 ENCOUNTER — Encounter: Payer: Self-pay | Admitting: Physician Assistant

## 2021-09-07 ENCOUNTER — Ambulatory Visit (INDEPENDENT_AMBULATORY_CARE_PROVIDER_SITE_OTHER): Payer: No Typology Code available for payment source | Admitting: Physician Assistant

## 2021-09-07 VITALS — BP 134/80 | HR 94 | Ht 68.0 in | Wt 231.2 lb

## 2021-09-07 DIAGNOSIS — I25110 Atherosclerotic heart disease of native coronary artery with unstable angina pectoris: Secondary | ICD-10-CM

## 2021-09-07 DIAGNOSIS — I214 Non-ST elevation (NSTEMI) myocardial infarction: Secondary | ICD-10-CM

## 2021-09-07 DIAGNOSIS — I5020 Unspecified systolic (congestive) heart failure: Secondary | ICD-10-CM

## 2021-09-07 DIAGNOSIS — I255 Ischemic cardiomyopathy: Secondary | ICD-10-CM | POA: Diagnosis not present

## 2021-09-07 DIAGNOSIS — Z720 Tobacco use: Secondary | ICD-10-CM

## 2021-09-07 DIAGNOSIS — Z89511 Acquired absence of right leg below knee: Secondary | ICD-10-CM

## 2021-09-07 DIAGNOSIS — F1010 Alcohol abuse, uncomplicated: Secondary | ICD-10-CM

## 2021-09-07 DIAGNOSIS — R000 Tachycardia, unspecified: Secondary | ICD-10-CM

## 2021-09-07 LAB — BASIC METABOLIC PANEL
BUN/Creatinine Ratio: 19 (ref 10–24)
BUN: 17 mg/dL (ref 8–27)
CO2: 21 mmol/L (ref 20–29)
Calcium: 9.7 mg/dL (ref 8.6–10.2)
Chloride: 102 mmol/L (ref 96–106)
Creatinine, Ser: 0.88 mg/dL (ref 0.76–1.27)
Glucose: 165 mg/dL — ABNORMAL HIGH (ref 70–99)
Potassium: 4.5 mmol/L (ref 3.5–5.2)
Sodium: 141 mmol/L (ref 134–144)
eGFR: 97 mL/min/{1.73_m2} (ref >59)

## 2021-09-07 MED ORDER — SACUBITRIL-VALSARTAN 24-26 MG PO TABS: 1.0000 | ORAL_TABLET | Freq: Two times a day (BID) | ORAL | 3 refills | Status: DC

## 2021-09-07 MED ORDER — SACUBITRIL-VALSARTAN 24-26 MG PO TABS
1.0000 | ORAL_TABLET | Freq: Two times a day (BID) | ORAL | 0 refills | Status: DC
Start: 2021-09-07 — End: 2022-06-19

## 2021-09-07 NOTE — Patient Instructions (Addendum)
Medication Instructions:  Your physician has recommended you make the following change in your medication:  1.Start entresto 24-26 mg twice daily  *If you need a refill on your cardiac medications before your next appointment, please call your pharmacy*   Lab Work: BMP today If you have labs (blood work) drawn today and your tests are completely normal, you will receive your results only by: MyChart Message (if you have MyChart) OR A paper copy in the mail If you have any lab test that is abnormal or we need to change your treatment, we will call you to review the results.  Follow-Up: At [facility], you and your health needs are our priority.  As part of our continuing mission to provide you with exceptional heart care, we have created designated Provider Care Teams.  These Care Teams include your primary Cardiologist (physician) and Advanced Practice Providers (APPs -  Physician Assistants and Nurse Practitioners) who all work together to provide you with the care you need, when you need it.   Your next appointment:   3 month(s)  The format for your next appointment:   In Person  Provider:   Charlton Haws, MD  or Jari Favre, PA-C       Other Instructions 1.You have been referred to cardiac rehab.  2.Check your blood pressure daily one hour after taking your morning medications and keep a log of your readings. You can send them to [place] through your MyChart.  Important Information About Sugar

## 2021-09-08 ENCOUNTER — Ambulatory Visit: Payer: Medicaid Other | Admitting: Physician Assistant

## 2021-09-13 ENCOUNTER — Telehealth: Payer: Self-pay | Admitting: Pharmacist

## 2021-09-13 NOTE — Telephone Encounter (Signed)
Called pt to see if he was able to get Entresto from the [name] and to schedule f/u visit in PharmD clinic for medication optimization.  Call went to VM and unable to leave VM. Same # for pt as for wife. Will try again.

## 2021-09-13 NOTE — Telephone Encounter (Signed)
I spoke with patient and his wife. They have not gotten Entresto from [name]. His BP has been 116/90, 120/80. [facility] I have asked him to address the [facility] and if the [name] wont approved the Entresto to add a low dose losartan if BP will allow. He is taking furosemide 40mg  daily now and breathing is better. I will follow up with them next week after [facility].

## 2021-09-14 ENCOUNTER — Other Ambulatory Visit: Payer: Self-pay | Admitting: Student

## 2021-10-06 NOTE — Telephone Encounter (Signed)
Labs at the [facility] 09/27/21 showed BNP 15.67, Mag 2.5, scr 0.88, K 4.1, Na 133. According to [facility] med list, I looks like they filled Entresto.

## 2021-10-13 ENCOUNTER — Telehealth (HOSPITAL_COMMUNITY): Payer: Self-pay | Admitting: *Deleted

## 2021-10-13 NOTE — Telephone Encounter (Signed)
Spoke with Mr Gregson. He confirmed his orientation appointment for Tuesday. Will call the patient back on Monday to complete health history.Thayer Headings RN BSN

## 2021-10-16 ENCOUNTER — Telehealth (HOSPITAL_COMMUNITY): Payer: Self-pay | Admitting: *Deleted

## 2021-10-16 NOTE — Telephone Encounter (Signed)
Pts health history interview completed by phone. Discussed pt wearing comfortable shoes, to bring med list, and how to get to appointment for tomorrow. Pt plans to bring his rollator and his wife.  Ethelda Chick BS, ACSM-CEP 10/16/2021 10:49 AM

## 2021-10-17 ENCOUNTER — Encounter (HOSPITAL_COMMUNITY)
Admission: RE | Admit: 2021-10-17 | Discharge: 2021-10-17 | Disposition: A | Discharge status: Home / Self Care | Payer: Medicaid Other | Source: Ambulatory Visit | Attending: Cardiovascular Disease | Admitting: Cardiovascular Disease

## 2021-10-17 DIAGNOSIS — I214 Non-ST elevation (NSTEMI) myocardial infarction: Secondary | ICD-10-CM | POA: Insufficient documentation

## 2021-10-17 DIAGNOSIS — Z955 Presence of coronary angioplasty implant and graft: Secondary | ICD-10-CM | POA: Insufficient documentation

## 2021-10-17 NOTE — Progress Notes (Signed)
Incomplete Session Note  Patient Details  Name: Ricardo Howard MRN: [id number] Date of Birth: 02/20/1961 Referring Provider:    Barney Howard did not complete his rehab session.  Ricardo Howard was not able to proceed with cardiac rehab orientation this afternoon as he does not have insurance coverage through [employer]. Ricardo Howard will obtain authorization through the [name] and reschedule when he has authorization. Vital signs are as follow. Blood pressure 104/68 via automatic cuff. CBG 244 via CGM. Oxygen saturation 94% on room air.Thayer Headings RN BSN

## 2021-10-25 ENCOUNTER — Ambulatory Visit (HOSPITAL_COMMUNITY): Payer: Medicaid Other

## 2021-10-27 ENCOUNTER — Ambulatory Visit (HOSPITAL_COMMUNITY): Payer: Medicaid Other

## 2021-10-30 ENCOUNTER — Ambulatory Visit (HOSPITAL_COMMUNITY): Payer: Medicaid Other

## 2021-11-01 ENCOUNTER — Ambulatory Visit (HOSPITAL_COMMUNITY): Payer: Medicaid Other

## 2021-11-03 ENCOUNTER — Ambulatory Visit (HOSPITAL_COMMUNITY): Payer: Medicaid Other

## 2021-11-06 ENCOUNTER — Ambulatory Visit (HOSPITAL_COMMUNITY): Payer: Medicaid Other

## 2021-11-07 ENCOUNTER — Encounter (HOSPITAL_COMMUNITY): Payer: Self-pay

## 2021-11-07 ENCOUNTER — Encounter (HOSPITAL_COMMUNITY)
Admission: RE | Admit: 2021-11-07 | Discharge: 2021-11-07 | Disposition: A | Discharge status: Home / Self Care | Payer: No Typology Code available for payment source | Source: Ambulatory Visit | Attending: Cardiovascular Disease | Admitting: Cardiovascular Disease

## 2021-11-07 VITALS — BP 113/62 | HR 90 | Ht 69.0 in | Wt 230.4 lb

## 2021-11-07 DIAGNOSIS — I252 Old myocardial infarction: Secondary | ICD-10-CM | POA: Insufficient documentation

## 2021-11-07 DIAGNOSIS — Z955 Presence of coronary angioplasty implant and graft: Secondary | ICD-10-CM | POA: Diagnosis not present

## 2021-11-07 DIAGNOSIS — Z5189 Encounter for other specified aftercare: Secondary | ICD-10-CM | POA: Diagnosis present

## 2021-11-07 DIAGNOSIS — I214 Non-ST elevation (NSTEMI) myocardial infarction: Secondary | ICD-10-CM

## 2021-11-07 NOTE — Progress Notes (Signed)
Cardiac Individual Treatment Plan  Patient Details  Name: Ricardo Howard MRN: [id number] Date of Birth: Aug 10, 1960 Referring Provider:   Flowsheet Row INTENSIVE CARDIAC REHAB ORIENT from 11/07/2021 in [facility] CARDIAC REHAB  Referring Provider Wendall Stade, MD       Initial Encounter Date:  Flowsheet Row INTENSIVE CARDIAC REHAB ORIENT from 11/07/2021 in [facility] CARDIAC REHAB  Date 11/07/21       Visit Diagnosis: NSTEMI, DES RCA 07/16/2021  Patient's Home Medications on Admission:  Current Outpatient Medications:    acetaminophen (TYLENOL) 500 MG tablet, Take 1,000 mg by mouth 2 (two) times daily as needed for mild pain., Disp: , Rfl:    aspirin EC 81 MG tablet, Take 81 mg by mouth in the morning., Disp: , Rfl:    atorvastatin (LIPITOR) 80 MG tablet, Take 1 tablet (80 mg total) by mouth at bedtime., Disp: 90 tablet, Rfl: 3   buPROPion (WELLBUTRIN SR) 150 MG 12 hr tablet, Take 150 mg by mouth in the morning and at bedtime., Disp: , Rfl:    carvedilol (COREG) 6.25 MG tablet, Take 1 tablet (6.25 mg total) by mouth 2 (two) times daily with a meal., Disp: 90 tablet, Rfl: 1   clopidogrel (PLAVIX) 75 MG tablet, Take 75 mg by mouth in the morning., Disp: , Rfl:    empagliflozin (JARDIANCE) 25 MG TABS tablet, Take 1 tablet (25 mg total) by mouth daily., Disp: 90 tablet, Rfl: 3   ezetimibe (ZETIA) 10 MG tablet, Take 1 tablet (10 mg total) by mouth daily., Disp: 90 tablet, Rfl: 3   folic acid (FOLVITE) 1 MG tablet, Take 1 tablet (1 mg total) by mouth daily., Disp: , Rfl:    furosemide (LASIX) 40 MG tablet, Take 1 tablet (40 mg total) by mouth 2 (two) times daily for 4 days, THEN 1 tablet (40 mg total) daily. (Patient taking differently: Take 1 tablet (40 mg total) by mouth daily.), Disp: 94 tablet, Rfl: 0   gabapentin (NEURONTIN) 300 MG capsule, Take 300-600 mg by mouth See admin instructions. Take 1 capsule (300 mg) by mouth in the morning, take 1  capsule (300 mg) by mouth at lunch, & take 2 capsules (600 mg) by mouth at bedtime, Disp: , Rfl:    insulin aspart (NOVOLOG) 100 UNIT/ML injection, Inject 10 Units into the skin 3 (three) times daily with meals. (Patient taking differently: Inject 10-20 Units into the skin 3 (three) times daily with meals. Sliding Scale Insulin), Disp: 10 mL, Rfl: 11   insulin glargine (LANTUS) 100 UNIT/ML Solostar Pen, 30units in am and 35unit at night  Further adjustment per your pcp and endocrinology Goal of a1c is less than 7%, currently your a1c is 10%, not at goal (Patient taking differently: 30-35 Units See admin instructions. 30units in am and 35unit at night  Further adjustment per your pcp and endocrinology Goal of a1c is less than 7%, currently your a1c is 10%, not at goal), Disp: 15 mL, Rfl: 0   Insulin Pen Needle (NOVOFINE) 30G X 8 MM MISC, Inject 10 each into the skin as needed., Disp: 100 each, Rfl: 0   isosorbide mononitrate (IMDUR) 60 MG 24 hr tablet, Take 30 mg by mouth in the morning., Disp: , Rfl:    lipase/protease/amylase (CREON) 36000 UNITS CPEP capsule, Take 1 capsule (36,000 Units total) by mouth 3 (three) times daily before meals., Disp: 270 capsule, Rfl: 0   lurasidone (LATUDA) 40 MG TABS tablet, Take 40 mg  by mouth every evening., Disp: , Rfl:    magnesium oxide (MAG-OX) 400 (241.3 Mg) MG tablet, Take 1 tablet (400 mg total) by mouth 2 (two) times daily., Disp: , Rfl:    mesalamine (LIALDA) 1.2 g EC tablet, Take 1.2 g by mouth in the morning., Disp: , Rfl:    naloxone (NARCAN) nasal spray 4 mg/0.1 mL, Place 1 spray into the nose as needed (opioid reversal)., Disp: , Rfl:    nitroGLYCERIN (NITROSTAT) 0.4 MG SL tablet, Place 1 tablet (0.4 mg total) under the tongue every 5 (five) minutes as needed for chest pain., Disp: 30 tablet, Rfl: 0   pantoprazole (PROTONIX) 40 MG tablet, Take 40 mg by mouth in the morning., Disp: , Rfl:    polyethylene glycol (MIRALAX / GLYCOLAX) 17 g packet, Take 17 g by  mouth daily as needed for mild constipation., Disp: 14 each, Rfl: 0   potassium chloride SA (KLOR-CON M) 20 MEQ tablet, Take 1 tablet (20 mEq total) by mouth daily., Disp: 90 tablet, Rfl: 1   pregabalin (LYRICA) 150 MG capsule, Take 150 mg by mouth 2 (two) times daily., Disp: , Rfl:    sacubitril-valsartan (ENTRESTO) 24-26 MG, Take 1 tablet by mouth 2 (two) times daily., Disp: 60 tablet, Rfl: 0   sacubitril-valsartan (ENTRESTO) 24-26 MG, Take 1 tablet by mouth 2 (two) times daily., Disp: 180 tablet, Rfl: 3   sertraline (ZOLOFT) 100 MG tablet, Take 100 mg by mouth daily., Disp: , Rfl:    tamsulosin (FLOMAX) 0.4 MG CAPS capsule, Take 0.4 mg by mouth daily., Disp: , Rfl:    thiamine 100 MG tablet, Take 1 tablet (100 mg total) by mouth daily., Disp: , Rfl:    vitamin B-12 (CYANOCOBALAMIN) 500 MCG tablet, Take 500 mcg by mouth in the morning., Disp: , Rfl:   Past Medical History: Past Medical History:  Diagnosis Date   Alcohol abuse 06/14/2019   Anxiety    Back pain    Cataracts, bilateral    CHF (congestive heart failure) (HCC)    Depression    Diabetic retinopathy (HCC)    Essential hypertension 06/14/2019   Hepatitis C    cured   Hx of BKA, right (HCC)    Hyperlipidemia    Hypertension    Mixed hyperlipidemia due to type 2 diabetes mellitus (HCC) 07/31/2019   Nicotine dependence, cigarettes, uncomplicated 07/31/2019   PAD (peripheral artery disease) (HCC)    Pancreatitis    PTSD (post-traumatic stress disorder)    Uncontrolled type 2 diabetes mellitus with diabetic polyneuropathy, with long-term current use of insulin 07/31/2019    Tobacco Use: Social History   Tobacco Use  Smoking Status Every Day   Packs/day: 0.25   Types: Cigarettes  Smokeless Tobacco Never    Labs: Review Flowsheet  More data exists      Latest Ref Rng & Units 07/21/2020 12/02/2020 05/31/2021 06/03/2021 07/16/2021  Labs for ITP Cardiac and Pulmonary Rehab  Cholestrol 0 - 200 mg/dL - - - - 629   LDL  (calc) 0 - 99 mg/dL - - - - 528   HDL-C >41 mg/dL - - - - 44   Trlycerides <150 mg/dL - - - - 58   Hemoglobin A1c 4.8 - 5.6 % 10.4  - - 10.1  10.9   Bicarbonate 20.0 - 28.0 mmol/L - - 14.9  - 27.3   TCO2 22 - 32 mmol/L - 24  16  - 29   Acid-base deficit 0.0 - 2.0 mmol/L - -  7.0  - -  O2 Saturation % - - 96  - 99     Capillary Blood Glucose: Lab Results  Component Value Date   GLUCAP 241 (H) 07/19/2021   GLUCAP 146 (H) 07/19/2021   GLUCAP 173 (H) 07/18/2021   GLUCAP 120 (H) 07/18/2021   GLUCAP 234 (H) 07/18/2021     Exercise Target Goals: Exercise Program Goal: Individual exercise prescription set using results from initial 6 min walk test and THRR while considering  patient's activity barriers and safety.   Exercise Prescription Goal: Initial exercise prescription builds to 30-45 minutes a day of aerobic activity, 2-3 days per week.  Home exercise guidelines will be given to patient during program as part of exercise prescription that the participant will acknowledge.  Activity Barriers & Risk Stratification:  Activity Barriers & Cardiac Risk Stratification - 11/07/21 1028       Activity Barriers & Cardiac Risk Stratification   Activity Barriers Other (comment);Assistive Device;History of Falls    Comments Right leg below knee amputee    Cardiac Risk Stratification High             6 Minute Walk:  6 Minute Walk     Row Name 11/07/21 1042         6 Minute Walk   Phase Initial     Distance 600 feet     Walk Time 6 minutes     # of Rest Breaks 2  seated rest breaks     MPH 1.14     METS 1.65     RPE 13     Perceived Dyspnea  2     VO2 Peak 5.76     Symptoms Yes (comment)     Comments Patient c/o mild SOB, RPD=2, right side pain, which started this morning, "4/10" on the pain scale.     Resting HR 90 bpm     Resting BP 113/62     Resting Oxygen Saturation  98 %     Exercise Oxygen Saturation  during 6 min walk 95 %     Max Ex. HR 99 bpm     Max Ex. BP  118/82     2 Minute Post BP 124/82       Interval HR   1 Minute HR 87     2 Minute HR 92     3 Minute HR 97     4 Minute HR 95     5 Minute HR 93     6 Minute HR 99     2 Minute Post HR 87     Interval Heart Rate? Yes       Interval Oxygen   Interval Oxygen? Yes     Baseline Oxygen Saturation % 98 %     1 Minute Oxygen Saturation % 95 %     2 Minute Oxygen Saturation % 95 %     3 Minute Oxygen Saturation % 95 %     4 Minute Oxygen Saturation % 96 %     5 Minute Oxygen Saturation % 100 %     6 Minute Oxygen Saturation % 96 %     2 Minute Post Oxygen Saturation % 97 %              Oxygen Initial Assessment:   Oxygen Re-Evaluation:   Oxygen Discharge (Final Oxygen Re-Evaluation):   Initial Exercise Prescription:  Initial Exercise Prescription - 11/07/21 1100       Date  of Initial Exercise RX and Referring Provider   Date 11/07/21    Referring Provider Wendall Stade, MD    Expected Discharge Date 01/05/22      T5 Nustep   Level 1    SPM 85    Minutes 30    METs 1.5      Prescription Details   Frequency (times per week) 3    Duration Progress to 30 minutes of continuous aerobic without signs/symptoms of physical distress      Intensity   THRR 40-80% of Max Heartrate 63-126    Ratings of Perceived Exertion 11-13    Perceived Dyspnea 0-4      Progression   Progression Continue to progress workloads to maintain intensity without signs/symptoms of physical distress.      Resistance Training   Training Prescription Yes    Weight 4 lbs    Reps 10-15             Perform Capillary Blood Glucose checks as needed.  Exercise Prescription Changes:   Exercise Comments:   Exercise Goals and Review:   Exercise Goals     Row Name 10/17/21 1309 11/07/21 1028           Exercise Goals   Increase Physical Activity Yes Yes      Intervention Provide advice, education, support and counseling about physical activity/exercise needs.;Develop an  individualized exercise prescription for aerobic and resistive training based on initial evaluation findings, risk stratification, comorbidities and participant's personal goals. Provide advice, education, support and counseling about physical activity/exercise needs.;Develop an individualized exercise prescription for aerobic and resistive training based on initial evaluation findings, risk stratification, comorbidities and participant's personal goals.      Expected Outcomes Short Term: Attend rehab on a regular basis to increase amount of physical activity.;Long Term: Exercising regularly at least 3-5 days a week.;Long Term: Add in home exercise to make exercise part of routine and to increase amount of physical activity. Short Term: Attend rehab on a regular basis to increase amount of physical activity.;Long Term: Exercising regularly at least 3-5 days a week.;Long Term: Add in home exercise to make exercise part of routine and to increase amount of physical activity.      Increase Strength and Stamina Yes Yes      Intervention Provide advice, education, support and counseling about physical activity/exercise needs.;Develop an individualized exercise prescription for aerobic and resistive training based on initial evaluation findings, risk stratification, comorbidities and participant's personal goals. Provide advice, education, support and counseling about physical activity/exercise needs.;Develop an individualized exercise prescription for aerobic and resistive training based on initial evaluation findings, risk stratification, comorbidities and participant's personal goals.      Expected Outcomes Short Term: Increase workloads from initial exercise prescription for resistance, speed, and METs.;Short Term: Perform resistance training exercises routinely during rehab and add in resistance training at home;Long Term: Improve cardiorespiratory fitness, muscular endurance and strength as measured by increased  METs and functional capacity ( ) Short Term: Increase workloads from initial exercise prescription for resistance, speed, and METs.;Short Term: Perform resistance training exercises routinely during rehab and add in resistance training at home;Long Term: Improve cardiorespiratory fitness, muscular endurance and strength as measured by increased METs and functional capacity ( )      Able to understand and use rate of perceived exertion (RPE) scale Yes Yes      Intervention Provide education and explanation on how to use RPE scale Provide education and explanation on how to use RPE scale  Expected Outcomes Short Term: Able to use RPE daily in rehab to express subjective intensity level;Long Term:  Able to use RPE to guide intensity level when exercising independently Short Term: Able to use RPE daily in rehab to express subjective intensity level;Long Term:  Able to use RPE to guide intensity level when exercising independently      Knowledge and understanding of Target Heart Rate Range (THRR) Yes Yes      Intervention Provide education and explanation of THRR including how the numbers were predicted and where they are located for reference Provide education and explanation of THRR including how the numbers were predicted and where they are located for reference      Expected Outcomes Short Term: Able to state/look up THRR;Long Term: Able to use THRR to govern intensity when exercising independently;Short Term: Able to use daily as guideline for intensity in rehab Short Term: Able to state/look up THRR;Long Term: Able to use THRR to govern intensity when exercising independently;Short Term: Able to use daily as guideline for intensity in rehab      Understanding of Exercise Prescription Yes Yes      Intervention Provide education, explanation, and written materials on patient's individual exercise prescription Provide education, explanation, and written materials on patient's individual exercise  prescription      Expected Outcomes Short Term: Able to explain program exercise prescription;Long Term: Able to explain home exercise prescription to exercise independently Short Term: Able to explain program exercise prescription;Long Term: Able to explain home exercise prescription to exercise independently               Exercise Goals Re-Evaluation :   Discharge Exercise Prescription (Final Exercise Prescription Changes):   Nutrition:  Target Goals: Understanding of nutrition guidelines, daily intake of sodium 1500mg , cholesterol 200mg , calories 30% from fat and 7% or less from saturated fats, daily to have 5 or more servings of fruits and vegetables.  Biometrics:  Pre Biometrics - 11/07/21 0955       Pre Biometrics   Waist Circumference 45.25 inches    Hip Circumference 45 inches    Waist to Hip Ratio 1.01 %    Triceps Skinfold 19 mm    % Body Fat 33 %    Grip Strength 44 kg    Flexibility --   Not performed, right BKA   Single Leg Stand --   Not performed, right BKA             Nutrition Therapy Plan and Nutrition Goals:   Nutrition Assessments:  MEDIFICTS Score Key: ?70 Need to make dietary changes  40-70 Heart Healthy Diet ? 40 Therapeutic Level Cholesterol Diet    Picture Your Plate Scores: <16 Unhealthy dietary pattern with much room for improvement. 41-50 Dietary pattern unlikely to meet recommendations for good health and room for improvement. 51-60 More healthful dietary pattern, with some room for improvement.  >60 Healthy dietary pattern, although there may be some specific behaviors that could be improved.    Nutrition Goals Re-Evaluation:   Nutrition Goals Re-Evaluation:   Nutrition Goals Discharge (Final Nutrition Goals Re-Evaluation):   Psychosocial: Target Goals: Acknowledge presence or absence of significant depression and/or stress, maximize coping skills, provide positive support system. Participant is able to verbalize types  and ability to use techniques and skills needed for reducing stress and depression.  Initial Review & Psychosocial Screening:  Initial Psych Review & Screening - 11/07/21 1037       Initial Review   Current issues  with History of Depression      Family Dynamics   Good Support System? Yes      Barriers   Psychosocial barriers to participate in program There are no identifiable barriers or psychosocial needs.      Screening Interventions   Interventions Encouraged to exercise;To provide support and resources with identified psychosocial needs;Provide feedback about the scores to participant    Expected Outcomes Short Term goal: Utilizing psychosocial counselor, staff and physician to assist with identification of specific Stressors or current issues interfering with healing process. Setting desired goal for each stressor or current issue identified.;Long Term Goal: Stressors or current issues are controlled or eliminated.;Short Term goal: Identification and review with participant of any Quality of Life or Depression concerns found by scoring the questionnaire.;Long Term goal: The participant improves quality of Life and PHQ9 Scores as seen by post scores and/or verbalization of changes             Quality of Life Scores:  Quality of Life - 11/07/21 1133       Quality of Life   Select Quality of Life      Quality of Life Scores   Health/Function Pre 17.97 %    Socioeconomic Pre 21.14 %    Psych/Spiritual Pre 24 %    Family Pre 25.2 %    GLOBAL Pre 20.93 %            Scores of 19 and below usually indicate a poorer quality of life in these areas.  A difference of  2-3 points is a clinically meaningful difference.  A difference of 2-3 points in the total score of the Quality of Life Index has been associated with significant improvement in overall quality of life, self-image, physical symptoms, and general health in studies assessing change in quality of  life.  PHQ-9: Review Flowsheet       11/07/2021  Depression screen PHQ 2/9  Decreased Interest 0  Down, Depressed, Hopeless 0  PHQ - 2 Score 0   Interpretation of Total Score  Total Score Depression Severity:  1-4 = Minimal depression, 5-9 = Mild depression, 10-14 = Moderate depression, 15-19 = Moderately severe depression, 20-27 = Severe depression   Psychosocial Evaluation and Intervention:   Psychosocial Re-Evaluation:   Psychosocial Discharge (Final Psychosocial Re-Evaluation):   Vocational Rehabilitation: Provide vocational rehab assistance to qualifying candidates.   Vocational Rehab Evaluation & Intervention:  Vocational Rehab - 11/07/21 1052       Initial Vocational Rehab Evaluation & Intervention   Assessment shows need for Vocational Rehabilitation No      Vocational Rehab Re-Evaulation   Comments retired             Education: Education Goals: Education classes will be provided on a weekly basis, covering required topics. Participant will state understanding/return demonstration of topics presented.     Core Videos: Exercise    Move It!  Clinical staff conducted group or individual video education with verbal and written material and guidebook.  Patient learns the recommended Pritikin exercise program. Exercise with the goal of living a long, healthy life. Some of the health benefits of exercise include controlled diabetes, healthier blood pressure levels, improved cholesterol levels, improved heart and lung capacity, improved sleep, and better body composition. Everyone should speak with their doctor before starting or changing an exercise routine.  Biomechanical Limitations Clinical staff conducted group or individual video education with verbal and written material and guidebook.  Patient learns how biomechanical limitations can  impact exercise and how we can mitigate and possibly overcome limitations to have an impactful and balanced exercise  routine.  Body Composition Clinical staff conducted group or individual video education with verbal and written material and guidebook.  Patient learns that body composition (ratio of muscle mass to fat mass) is a key component to assessing overall fitness, rather than body weight alone. Increased fat mass, especially visceral belly fat, can put [place] at increased risk for metabolic syndrome, type 2 diabetes, heart disease, and even death. It is recommended to combine diet and exercise (cardiovascular and resistance training) to improve your body composition. Seek guidance from your physician and exercise physiologist before implementing an exercise routine.  Exercise Action Plan Clinical staff conducted group or individual video education with verbal and written material and guidebook.  Patient learns the recommended strategies to achieve and enjoy long-term exercise adherence, including variety, self-motivation, self-efficacy, and positive decision making. Benefits of exercise include fitness, good health, weight management, more energy, better sleep, less stress, and overall well-being.  Medical   Heart Disease Risk Reduction Clinical staff conducted group or individual video education with verbal and written material and guidebook.  Patient learns our heart is our most vital organ as it circulates oxygen, nutrients, white blood cells, and hormones throughout the entire body, and carries waste away. Data supports a plant-based eating plan like the Pritikin Program for its effectiveness in slowing progression of and reversing heart disease. The video provides a number of recommendations to address heart disease.   Metabolic Syndrome and Belly Fat  Clinical staff conducted group or individual video education with verbal and written material and guidebook.  Patient learns what metabolic syndrome is, how it leads to heart disease, and how one can reverse it and keep it from coming back. You have  metabolic syndrome if you have 3 of the following 5 criteria: abdominal obesity, high blood pressure, high triglycerides, low HDL cholesterol, and high blood sugar.  Hypertension and Heart Disease Clinical staff conducted group or individual video education with verbal and written material and guidebook.  Patient learns that high blood pressure, or hypertension, is very common in the [place]. Hypertension is largely due to excessive salt intake, but other important risk factors include being overweight, physical inactivity, drinking too much alcohol, smoking, and not eating enough potassium from fruits and vegetables. High blood pressure is a leading risk factor for heart attack, stroke, congestive heart failure, dementia, kidney failure, and premature death. Long-term effects of excessive salt intake include stiffening of the arteries and thickening of heart muscle and organ damage. Recommendations include ways to reduce hypertension and the risk of heart disease.  Diseases of Our Time - Focusing on Diabetes Clinical staff conducted group or individual video education with verbal and written material and guidebook.  Patient learns why the best way to stop diseases of our time is prevention, through food and other lifestyle changes. Medicine (such as prescription pills and surgeries) is often only a Band-Aid on the problem, not a long-term solution. Most common diseases of our time include obesity, type 2 diabetes, hypertension, heart disease, and cancer. The Pritikin Program is recommended and has been proven to help reduce, reverse, and/or prevent the damaging effects of metabolic syndrome.  Nutrition   Overview of the Pritikin Eating Plan  Clinical staff conducted group or individual video education with verbal and written material and guidebook.  Patient learns about the [place] for disease risk reduction. The Pritikin Eating Plan emphasizes a  wide variety of unrefined,  minimally-processed carbohydrates, like fruits, vegetables, whole grains, and legumes. Go, Caution, and Stop food choices are explained. Plant-based and lean animal proteins are emphasized. Rationale provided for low sodium intake for blood pressure control, low added sugars for blood sugar stabilization, and low added fats and oils for coronary artery disease risk reduction and weight management.  Calorie Density  Clinical staff conducted group or individual video education with verbal and written material and guidebook.  Patient learns about calorie density and how it impacts the Pritikin Eating Plan. Knowing the characteristics of the food you choose will help you decide whether those foods will lead to weight gain or weight loss, and whether you want to consume more or less of them. Weight loss is usually a side effect of the Pritikin Eating Plan because of its focus on low calorie-dense foods.  Label Reading  Clinical staff conducted group or individual video education with verbal and written material and guidebook.  Patient learns about the Pritikin recommended label reading guidelines and corresponding recommendations regarding calorie density, added sugars, sodium content, and whole grains.  Dining Out - Part 1  Clinical staff conducted group or individual video education with verbal and written material and guidebook.  Patient learns that restaurant meals can be sabotaging because they can be so high in calories, fat, sodium, and/or sugar. Patient learns recommended strategies on how to positively address this and avoid unhealthy pitfalls.  Facts on Fats  Clinical staff conducted group or individual video education with verbal and written material and guidebook.  Patient learns that lifestyle modifications can be just as effective, if not more so, as many medications for lowering your risk of heart disease. A Pritikin lifestyle can help to reduce your risk of inflammation and atherosclerosis  (cholesterol build-up, or plaque, in the artery walls). Lifestyle interventions such as dietary choices and physical activity address the cause of atherosclerosis. A review of the types of fats and their impact on blood cholesterol levels, along with dietary recommendations to reduce fat intake is also included.  Nutrition Action Plan  Clinical staff conducted group or individual video education with verbal and written material and guidebook.  Patient learns how to incorporate Pritikin recommendations into their lifestyle. Recommendations include planning and keeping personal health goals in mind as an important part of their success.  Healthy Mind-Set    Healthy Minds, Bodies, Hearts  Clinical staff conducted group or individual video education with verbal and written material and guidebook.  Patient learns how to identify when they are stressed. Video will discuss the impact of that stress, as well as the many benefits of stress management. Patient will also be introduced to stress management techniques. The way we think, act, and feel has an impact on our hearts.  How Our Thoughts Can Heal Our Hearts  Clinical staff conducted group or individual video education with verbal and written material and guidebook.  Patient learns that negative thoughts can cause depression and anxiety. This can result in negative lifestyle behavior and serious health problems. Cognitive behavioral therapy is an effective method to help control our thoughts in order to change and improve our emotional outlook.  Additional Videos:  Exercise    Improving Performance  Clinical staff conducted group or individual video education with verbal and written material and guidebook.  Patient learns to use a non-linear approach by alternating intensity levels and lengths of time spent exercising to help burn more calories and lose more body fat. Cardiovascular exercise helps  improve heart health, metabolism, hormonal balance,  blood sugar control, and recovery from fatigue. Resistance training improves strength, endurance, balance, coordination, reaction time, metabolism, and muscle mass. Flexibility exercise improves circulation, posture, and balance. Seek guidance from your physician and exercise physiologist before implementing an exercise routine and learn your capabilities and proper form for all exercise.  Introduction to Yoga  Clinical staff conducted group or individual video education with verbal and written material and guidebook.  Patient learns about yoga, a discipline of the coming together of mind, breath, and body. The benefits of yoga include improved flexibility, improved range of motion, better posture and core strength, increased lung function, weight loss, and positive self-image. Yoga's heart health benefits include lowered blood pressure, healthier heart rate, decreased cholesterol and triglyceride levels, improved immune function, and reduced stress. Seek guidance from your physician and exercise physiologist before implementing an exercise routine and learn your capabilities and proper form for all exercise.  Medical   Aging: Enhancing Your Quality of Life  Clinical staff conducted group or individual video education with verbal and written material and guidebook.  Patient learns key strategies and recommendations to stay in good physical health and enhance quality of life, such as prevention strategies, having an advocate, securing a Health Care Proxy and Power of Attorney, and keeping a list of medications and system for tracking them. It also discusses how to avoid risk for bone loss.  Biology of Weight Control  Clinical staff conducted group or individual video education with verbal and written material and guidebook.  Patient learns that weight gain occurs because we consume more calories than we burn (eating more, moving less). Even if your body weight is normal, you may have higher ratios of fat  compared to muscle mass. Too much body fat puts you at increased risk for cardiovascular disease, heart attack, stroke, type 2 diabetes, and obesity-related cancers. In addition to exercise, following the Pritikin Eating Plan can help reduce your risk.  Decoding Lab Results  Clinical staff conducted group or individual video education with verbal and written material and guidebook.  Patient learns that lab test reflects one measurement whose values change over time and are influenced by many factors, including medication, stress, sleep, exercise, food, hydration, pre-existing medical conditions, and more. It is recommended to use the knowledge from this video to become more involved with your lab results and evaluate your numbers to speak with your doctor.   Diseases of Our Time - Overview  Clinical staff conducted group or individual video education with verbal and written material and guidebook.  Patient learns that according to the CDC, 50% to 70% of chronic diseases (such as obesity, type 2 diabetes, elevated lipids, hypertension, and heart disease) are avoidable through lifestyle improvements including healthier food choices, listening to satiety cues, and increased physical activity.  Sleep Disorders Clinical staff conducted group or individual video education with verbal and written material and guidebook.  Patient learns how good quality and duration of sleep are important to overall health and well-being. Patient also learns about sleep disorders and how they impact health along with recommendations to address them, including discussing with a physician.  Nutrition  Dining Out - Part 2 Clinical staff conducted group or individual video education with verbal and written material and guidebook.  Patient learns how to plan ahead and communicate in order to maximize their dining experience in a healthy and nutritious manner. Included are recommended food choices based on the type of restaurant  the patient is  visiting.   Fueling a Banker conducted group or individual video education with verbal and written material and guidebook.  There is a strong connection between our food choices and our health. Diseases like obesity and type 2 diabetes are very prevalent and are in large-part due to lifestyle choices. The Pritikin Eating Plan provides plenty of food and hunger-curbing satisfaction. It is easy to follow, affordable, and helps reduce health risks.  Menu Workshop  Clinical staff conducted group or individual video education with verbal and written material and guidebook.  Patient learns that restaurant meals can sabotage health goals because they are often packed with calories, fat, sodium, and sugar. Recommendations include strategies to plan ahead and to communicate with the manager, chef, or server to help order a healthier meal.  Planning Your Eating Strategy  Clinical staff conducted group or individual video education with verbal and written material and guidebook.  Patient learns about the Pritikin Eating Plan and its benefit of reducing the risk of disease. The Pritikin Eating Plan does not focus on calories. Instead, it emphasizes high-quality, nutrient-rich foods. By knowing the characteristics of the foods, we choose, we can determine their calorie density and make informed decisions.  Targeting Your Nutrition Priorities  Clinical staff conducted group or individual video education with verbal and written material and guidebook.  Patient learns that lifestyle habits have a tremendous impact on disease risk and progression. This video provides eating and physical activity recommendations based on your personal health goals, such as reducing LDL cholesterol, losing weight, preventing or controlling type 2 diabetes, and reducing high blood pressure.  Vitamins and Minerals  Clinical staff conducted group or individual video education with verbal and written  material and guidebook.  Patient learns different ways to obtain key vitamins and minerals, including through a recommended healthy diet. It is important to discuss all supplements you take with your doctor.   Healthy Mind-Set    Smoking Cessation  Clinical staff conducted group or individual video education with verbal and written material and guidebook.  Patient learns that cigarette smoking and tobacco addiction pose a serious health risk which affects millions of people. Stopping smoking will significantly reduce the risk of heart disease, lung disease, and many forms of cancer. Recommended strategies for quitting are covered, including working with your doctor to develop a successful plan.  Culinary   Becoming a Set designer conducted group or individual video education with verbal and written material and guidebook.  Patient learns that cooking at home can be healthy, cost-effective, quick, and puts them in control. Keys to cooking healthy recipes will include looking at your recipe, assessing your equipment needs, planning ahead, making it simple, choosing cost-effective seasonal ingredients, and limiting the use of added fats, salts, and sugars.  Cooking - Breakfast and Snacks  Clinical staff conducted group or individual video education with verbal and written material and guidebook.  Patient learns how important breakfast is to satiety and nutrition through the entire day. Recommendations include key foods to eat during breakfast to help stabilize blood sugar levels and to prevent overeating at meals later in the day. Planning ahead is also a key component.  Cooking - Educational psychologist conducted group or individual video education with verbal and written material and guidebook.  Patient learns eating strategies to improve overall health, including an approach to cook more at home. Recommendations include thinking of animal protein as a side on your plate  rather  than center stage and focusing instead on lower calorie dense options like vegetables, fruits, whole grains, and plant-based proteins, such as beans. Making sauces in large quantities to freeze for later and leaving the skin on your vegetables are also recommended to maximize your experience.  Cooking - Healthy Salads and Dressing Clinical staff conducted group or individual video education with verbal and written material and guidebook.  Patient learns that vegetables, fruits, whole grains, and legumes are the foundations of the Pritikin Eating Plan. Recommendations include how to incorporate each of these in flavorful and healthy salads, and how to create homemade salad dressings. Proper handling of ingredients is also covered. Cooking - Soups and [employer] Farm[employer]esserts  Cooking - Soups and Desserts Clinical staff conducted group or individual video education with verbal and written material and guidebook.  Patient learns that Pritikin soups and desserts make for easy, nutritious, and delicious snacks and meal components that are low in sodium, fat, sugar, and calorie density, while high in vitamins, minerals, and filling fiber. Recommendations include simple and healthy ideas for soups and desserts.   Overview     The Pritikin Solution Program Overview Clinical staff conducted group or individual video education with verbal and written material and guidebook.  Patient learns that the results of the Pritikin Program have been documented in more than 100 articles published in peer-reviewed journals, and the benefits include reducing risk factors for (and, in some cases, even reversing) high cholesterol, high blood pressure, type 2 diabetes, obesity, and more! An overview of the three key pillars of the Pritikin Program will be covered: eating well, doing regular exercise, and having a healthy mind-set.  WORKSHOPS  Exercise: Exercise Basics: Building Your Action Plan Clinical staff led group instruction  and group discussion with PowerPoint presentation and patient guidebook. To enhance the learning environment the use of posters, models and videos may be added. At the conclusion of this workshop, patients will comprehend the difference between physical activity and exercise, as well as the benefits of incorporating both, into their routine. Patients will understand the FITT (Frequency, Intensity, Time, and Type) principle and how to use it to build an exercise action plan. In addition, safety concerns and other considerations for exercise and cardiac rehab will be addressed by the presenter. The purpose of this lesson is to promote a comprehensive and effective weekly exercise routine in order to improve patients' overall level of fitness.   Managing Heart Disease: Your Path to a Healthier Heart Clinical staff led group instruction and group discussion with PowerPoint presentation and patient guidebook. To enhance the learning environment the use of posters, models and videos may be added.At the conclusion of this workshop, patients will understand the anatomy and physiology of the heart. Additionally, they will understand how Pritikin's three pillars impact the risk factors, the progression, and the management of heart disease.  The purpose of this lesson is to provide a high-level overview of the heart, heart disease, and how the Pritikin lifestyle positively impacts risk factors.  Exercise Biomechanics Clinical staff led group instruction and group discussion with PowerPoint presentation and patient guidebook. To enhance the learning environment the use of posters, models and videos may be added. Patients will learn how the structural parts of their bodies function and how these functions impact their daily activities, movement, and exercise. Patients will learn how to promote a neutral spine, learn how to manage pain, and identify ways to improve their physical movement in order to  promote healthy living. The purpose of  this lesson is to expose patients to common physical limitations that impact physical activity. Participants will learn practical ways to adapt and manage aches and pains, and to minimize their effect on regular exercise. Patients will learn how to maintain good posture while sitting, walking, and lifting.  Balance Training and Fall Prevention  Clinical staff led group instruction and group discussion with PowerPoint presentation and patient guidebook. To enhance the learning environment the use of posters, models and videos may be added. At the conclusion of this workshop, patients will understand the importance of their sensorimotor skills (vision, proprioception, and the vestibular system) in maintaining their ability to balance as they age. Patients will apply a variety of balancing exercises that are appropriate for their current level of function. Patients will understand the common causes for poor balance, possible solutions to these problems, and ways to modify their physical environment in order to minimize their fall risk. The purpose of this lesson is to teach patients about the importance of maintaining balance as they age and ways to minimize their risk of falling.  WORKSHOPS   Nutrition:  Fueling a Ship broker led group instruction and group discussion with PowerPoint presentation and patient guidebook. To enhance the learning environment the use of posters, models and videos may be added. Patients will review the foundational principles of the Pritikin Eating Plan and understand what constitutes a serving size in each of the food groups. Patients will also learn Pritikin-friendly foods that are better choices when away from home and review make-ahead meal and snack options. Calorie density will be reviewed and applied to three nutrition priorities: weight maintenance, weight loss, and weight gain. The purpose of this lesson is to  reinforce (in a group setting) the key concepts around what patients are recommended to eat and how to apply these guidelines when away from home by planning and selecting Pritikin-friendly options. Patients will understand how calorie density may be adjusted for different weight management goals.  Mindful Eating  Clinical staff led group instruction and group discussion with PowerPoint presentation and patient guidebook. To enhance the learning environment the use of posters, models and videos may be added. Patients will briefly review the concepts of the Pritikin Eating Plan and the importance of low-calorie dense foods. The concept of mindful eating will be introduced as well as the importance of paying attention to internal hunger signals. Triggers for non-hunger eating and techniques for dealing with triggers will be explored. The purpose of this lesson is to provide patients with the opportunity to review the basic principles of the Pritikin Eating Plan, discuss the value of eating mindfully and how to measure internal cues of hunger and fullness using the Hunger Scale. Patients will also discuss reasons for non-hunger eating and learn strategies to use for controlling emotional eating.  Targeting Your Nutrition Priorities Clinical staff led group instruction and group discussion with PowerPoint presentation and patient guidebook. To enhance the learning environment the use of posters, models and videos may be added. Patients will learn how to determine their genetic susceptibility to disease by reviewing their family history. Patients will gain insight into the importance of diet as part of an overall healthy lifestyle in mitigating the impact of genetics and other environmental insults. The purpose of this lesson is to provide patients with the opportunity to assess their personal nutrition priorities by looking at their family history, their own health history and current risk factors. Patients will  also be able to discuss ways of  prioritizing and modifying the Pritikin Eating Plan for their highest risk areas  Menu  Clinical staff led group instruction and group discussion with PowerPoint presentation and patient guidebook. To enhance the learning environment the use of posters, models and videos may be added. Using menus brought in from [employer], or printed from [employer], patients will apply the Pritikin dining out guidelines that were presented in the [employer] video. Patients will also be able to practice these guidelines in a variety of provided scenarios. The purpose of this lesson is to provide patients with the opportunity to practice hands-on learning of the Pritikin Dining Out guidelines with actual menus and practice scenarios.  Label Reading Clinical staff led group instruction and group discussion with PowerPoint presentation and patient guidebook. To enhance the learning environment the use of posters, models and videos may be added. Patients will review and discuss the Pritikin label reading guidelines presented in Pritikin's Label Reading Educational series video. Using fool labels brought in from local grocery stores and markets, patients will apply the label reading guidelines and determine if the packaged food meet the Pritikin guidelines. The purpose of this lesson is to provide patients with the opportunity to review, discuss, and practice hands-on learning of the Pritikin Label Reading guidelines with actual packaged food labels. Cooking School  Pritikin's [employer] are designed to teach patients ways to prepare quick, simple, and affordable recipes at home. The importance of nutrition's role in chronic disease risk reduction is reflected in its emphasis in the overall Pritikin program. By learning how to prepare essential core Pritikin Eating Plan recipes, patients will increase control over what they eat; be able to customize the  flavor of foods without the use of added salt, sugar, or fat; and improve the quality of the food they consume. By learning a set of core recipes which are easily assembled, quickly prepared, and affordable, patients are more likely to prepare more healthy foods at home. These workshops focus on convenient breakfasts, simple entres, side dishes, and desserts which can be prepared with minimal effort and are consistent with nutrition recommendations for cardiovascular risk reduction. Cooking [employer] are taught by a Armed forces logistics/support/administrative officer (RD) who has been trained by the [employer]. The chef or RD has a clear understanding of the importance of minimizing - if not completely eliminating - added fat, sugar, and sodium in recipes. Throughout the series of Cooking School Workshop sessions, patients will learn about healthy ingredients and efficient methods of cooking to build confidence in their capability to prepare    Cooking School weekly topics:  Adding Flavor- Sodium-Free  Fast and Healthy Breakfasts  Powerhouse Plant-Based Proteins  Satisfying Salads and Dressings  Simple Sides and Sauces  International Cuisine-Spotlight on the [employer] Zones  Delicious Desserts  Savory Soups  Efficiency Cooking - Meals in a Snap  Tasty Appetizers and Snacks  Comforting Weekend Breakfasts  One-Pot Wonders   Fast Evening Meals  Easy Entertaining  Personalizing Your Pritikin Plate  WORKSHOPS   Healthy Mindset (Psychosocial): New Thoughts, New Behaviors Clinical staff led group instruction and group discussion with PowerPoint presentation and patient guidebook. To enhance the learning environment the use of posters, models and videos may be added. Patients will learn and practice techniques for developing effective health and lifestyle goals. Patients will be able to effectively apply the goal setting process learned to develop at least one new personal goal.  The purpose of this  lesson is  to expose patients to a new skill set of behavior modification techniques such as techniques setting SMART goals, overcoming barriers, and achieving new thoughts and new behaviors.  Managing Moods and Relationships Clinical staff led group instruction and group discussion with PowerPoint presentation and patient guidebook. To enhance the learning environment the use of posters, models and videos may be added. Patients will learn how emotional and chronic stress factors can impact their health and relationships. They will learn healthy ways to manage their moods and utilize positive coping mechanisms. In addition, ICR patients will learn ways to improve communication skills. The purpose of this lesson is to expose patients to ways of understanding how one's mood and health are intimately connected. Developing a healthy outlook can help build positive relationships and connections with others. Patients will understand the importance of utilizing effective communication skills that include actively listening and being heard. They will learn and understand the importance of the "4 Cs" and especially Connections in fostering of a Healthy Mind-Set.  Healthy Sleep for a Healthy Heart Clinical staff led group instruction and group discussion with PowerPoint presentation and patient guidebook. To enhance the learning environment the use of posters, models and videos may be added. At the conclusion of this workshop, patients will be able to demonstrate knowledge of the importance of sleep to overall health, well-being, and quality of life. They will understand the symptoms of, and treatments for, common sleep disorders. Patients will also be able to identify daytime and nighttime behaviors which impact sleep, and they will be able to apply these tools to help manage sleep-related challenges. The purpose of this lesson is to provide patients with a general overview of sleep and outline the importance of quality  sleep. Patients will learn about a few of the most common sleep disorders. Patients will also be introduced to the concept of "sleep hygiene," and discover ways to self-manage certain sleeping problems through simple daily behavior changes. Finally, the workshop will motivate patients by clarifying the links between quality sleep and their goals of heart-healthy living.   Recognizing and Reducing Stress Clinical staff led group instruction and group discussion with PowerPoint presentation and patient guidebook. To enhance the learning environment the use of posters, models and videos may be added. At the conclusion of this workshop, patients will be able to understand the types of stress reactions, differentiate between acute and chronic stress, and recognize the impact that chronic stress has on their health. They will also be able to apply different coping mechanisms, such as reframing negative self-talk. Patients will have the opportunity to practice a variety of stress management techniques, such as deep abdominal breathing, progressive muscle relaxation, and/or guided imagery.  The purpose of this lesson is to educate patients on the role of stress in their lives and to provide healthy techniques for coping with it.  Learning Barriers/Preferences:  Learning Barriers/Preferences - 11/07/21 1326       Learning Barriers/Preferences   Learning Barriers None    Learning Preferences Written Material;Verbal Instruction             Education Topics:  Knowledge Questionnaire Score:  Knowledge Questionnaire Score - 11/07/21 1133       Knowledge Questionnaire Score   Pre Score 21/28             Core Components/Risk Factors/Patient Goals at Admission:  Personal Goals and Risk Factors at Admission - 11/07/21 1133       Core Components/Risk Factors/Patient Goals on Admission  Weight Management Yes;Obesity    Intervention Weight Management/Obesity: Establish reasonable short term and  long term weight goals.;Obesity: Provide education and appropriate resources to help participant work on and attain dietary goals.    Admit Weight 230 lb 6.1 oz (104.5 kg)    Expected Outcomes Short Term: Continue to assess and modify interventions until short term weight is achieved;Long Term: Adherence to nutrition and physical activity/exercise program aimed toward attainment of established weight goal;Weight Loss: Understanding of general recommendations for a balanced deficit meal plan, which promotes 1-2 lb weight loss per week and includes a negative energy balance of [phone] kcal/d    Tobacco Cessation Yes    Number of packs per day 0.25    Intervention Assist the participant in steps to quit. Provide individualized education and counseling about committing to Tobacco Cessation, relapse prevention, and pharmacological support that can be provided by physician.;Education officer, environmental, assist with locating and accessing local/national Quit Smoking programs, and support quit date choice.    Expected Outcomes Short Term: Will demonstrate readiness to quit, by selecting a quit date.;Long Term: Complete abstinence from all tobacco products for at least 12 months from quit date.;Short Term: Will quit all tobacco product use, adhering to prevention of relapse plan.    Diabetes Yes    Intervention Provide education about signs/symptoms and action to take for hypo/hyperglycemia.;Provide education about proper nutrition, including hydration, and aerobic/resistive exercise prescription along with prescribed medications to achieve blood glucose in normal ranges: Fasting glucose 65-99 mg/dL    Expected Outcomes Short Term: Participant verbalizes understanding of the signs/symptoms and immediate care of hyper/hypoglycemia, proper foot care and importance of medication, aerobic/resistive exercise and nutrition plan for blood glucose control.;Long Term: Attainment of HbA1C < 7%.    Heart Failure Yes     Intervention Provide a combined exercise and nutrition program that is supplemented with education, support and counseling about heart failure. Directed toward relieving symptoms such as shortness of breath, decreased exercise tolerance, and extremity edema.    Expected Outcomes Improve functional capacity of life;Short term: Attendance in program 2-3 days a week with increased exercise capacity. Reported lower sodium intake. Reported increased fruit and vegetable intake. Reports medication compliance.;Short term: Daily weights obtained and reported for increase. Utilizing diuretic protocols set by physician.;Long term: Adoption of self-care skills and reduction of barriers for early signs and symptoms recognition and intervention leading to self-care maintenance.    Hypertension Yes    Intervention Provide education on lifestyle modifcations including regular physical activity/exercise, weight management, moderate sodium restriction and increased consumption of fresh fruit, vegetables, and low fat dairy, alcohol moderation, and smoking cessation.;Monitor prescription use compliance.    Expected Outcomes Short Term: Continued assessment and intervention until BP is < 140/2mm HG in hypertensive participants. < 130/32mm HG in hypertensive participants with diabetes, heart failure or chronic kidney disease.;Long Term: Maintenance of blood pressure at goal levels.    Lipids Yes    Intervention Provide education and support for participant on nutrition & aerobic/resistive exercise along with prescribed medications to achieve LDL 70mg , HDL >40mg .    Expected Outcomes Short Term: Participant states understanding of desired cholesterol values and is compliant with medications prescribed. Participant is following exercise prescription and nutrition guidelines.;Long Term: Cholesterol controlled with medications as prescribed, with individualized exercise RX and with personalized nutrition plan. Value goals: LDL <  , HDL > 40 mg.    Personal Goal Other Yes    Personal Goal Be able to get back to gardening, yardwork,  and housework.    Intervention Provide individualized exercise prescription including aerobic, resistance, and stretching exercises that patient can do to help increase strength and stamina, so that he can do ADLs and activites he enjoys such as gardening.    Expected Outcomes Patient will have strength and stamina to perform ADLs including gardening, yardwork, and housework.             Core Components/Risk Factors/Patient Goals Review:    Core Components/Risk Factors/Patient Goals at Discharge (Final Review):    ITP Comments:  ITP Comments     Row Name 11/07/21 1040           ITP Comments Dr. Armanda Magic, Medical Director, Introduction to [facility] intensive cardiac rehab. Initial [facility] orientation packet reviewed with patient.                Comments: Participant attended orientation for the cardiac rehabilitation program on  11/07/2021  to perform initial intake and exercise walk test. Patient introduced to the [facility] education and orientation packet was reviewed. Completed 6-minute walk test, measurements, initial ITP, and exercise prescription. Vital signs stable. Telemetry-normal sinus rhythm, patient c/o SOB with exertion, took one rest break during 6 minute walk test however was able to complete test. Newell Coral RN 9/19/20231:48 PM  Service time was from 0955 to 1133.

## 2021-11-07 NOTE — Progress Notes (Signed)
Patient has no problem obtaining medications; he has a good understanding of what he is taking medications for.

## 2021-11-08 ENCOUNTER — Ambulatory Visit (HOSPITAL_COMMUNITY): Payer: Medicaid Other

## 2021-11-10 ENCOUNTER — Ambulatory Visit (HOSPITAL_COMMUNITY): Payer: Medicaid Other

## 2021-11-13 ENCOUNTER — Ambulatory Visit (HOSPITAL_COMMUNITY): Payer: Medicaid Other

## 2021-11-13 ENCOUNTER — Encounter (HOSPITAL_COMMUNITY)
Admission: RE | Admit: 2021-11-13 | Discharge: 2021-11-13 | Disposition: A | Discharge status: Home / Self Care | Payer: No Typology Code available for payment source | Source: Ambulatory Visit | Attending: Cardiovascular Disease | Admitting: Cardiovascular Disease

## 2021-11-13 DIAGNOSIS — Z955 Presence of coronary angioplasty implant and graft: Secondary | ICD-10-CM

## 2021-11-13 DIAGNOSIS — I214 Non-ST elevation (NSTEMI) myocardial infarction: Secondary | ICD-10-CM

## 2021-11-13 DIAGNOSIS — Z5189 Encounter for other specified aftercare: Secondary | ICD-10-CM | POA: Diagnosis not present

## 2021-11-13 LAB — GLUCOSE, CAPILLARY
Glucose-Capillary: 233 mg/dL — ABNORMAL HIGH (ref 70–99)
Glucose-Capillary: 210 mg/dL — ABNORMAL HIGH (ref 70–99)

## 2021-11-13 NOTE — Progress Notes (Signed)
Daily Session Note  Patient Details  Name: Ricardo Howard MRN: [id number] Date of Birth: 1960/09/07 Referring Provider:   Flowsheet Row INTENSIVE CARDIAC REHAB ORIENT from 11/07/2021 in [place]  Referring Provider Josue Hector, MD       Encounter Date: 11/13/2021  Check In:  Session Check In - 11/13/21 1149       Check-In   Supervising physician immediately available to respond to emergencies [facility] - Physician supervision    Physician(s) Dr. Sallyanne Kuster    Location [facility]    Staff Present Seward Carol, MS, ACSM-CEP, Exercise Physiologist;Bailey Pearline Cables, MS, Exercise Physiologist;Jetta Gilford Rile BS, ACSM-CEP, Exercise Physiologist;David Makemson, MS, ACSM-CEP, CCRP, Exercise Physiologist;Berneita Sanagustin, RN, BSN    Virtual Visit No    Medication changes reported     No    Fall or balance concerns reported    No    Tobacco Cessation No Change    Current number of cigarettes/nicotine per day     5    Warm-up and Cool-down Performed as group-led instruction    Resistance Training Performed Yes    VAD Patient? No    PAD/SET Patient? No      Pain Assessment   Currently in Pain? No/denies    Pain Score 0-No pain    Multiple Pain Sites No             Capillary Blood Glucose: Results for orders placed or performed during the hospital encounter of 11/13/21 (from the past 24 hour(s))  Glucose, capillary     Status: Abnormal   Collection Time: 11/13/21 10:19 AM  Result Value Ref Range   Glucose-Capillary 233 (H) 70 - 99 mg/dL  Glucose, capillary     Status: Abnormal   Collection Time: 11/13/21 11:04 AM  Result Value Ref Range   Glucose-Capillary 210 (H) 70 - 99 mg/dL     Exercise Prescription Changes - 11/13/21 1027       Response to Exercise   Blood Pressure (Admit) 95/66    Blood Pressure (Exercise) 116/73    Blood Pressure (Exit) 106/71    Heart Rate (Admit) 89 bpm    Heart Rate (Exercise) 97 bpm    Heart Rate  (Exit) 89 bpm    Rating of Perceived Exertion (Exercise) 13    Symptoms None    Comments Off to a good start with exercise.    Duration Progress to 30 minutes of  aerobic without signs/symptoms of physical distress    Intensity THRR unchanged      Progression   Progression Continue to progress workloads to maintain intensity without signs/symptoms of physical distress.    Average METs 1.9      Resistance Training   Training Prescription Yes    Weight 4 lbs    Reps 10-15    Time 10 Minutes      Interval Training   Interval Training No      T5 Nustep   Level 1    SPM 75    Minutes 20    METs 1.9             Social History   Tobacco Use  Smoking Status Every Day   Packs/day: 0.25   Types: Cigarettes  Smokeless Tobacco Never    Goals Met:  Exercise tolerated well No report of concerns or symptoms today Strength training completed today  Goals Unmet:  Not Applicable  Comments: [facility]  started cardiac rehab today.  Pt  tolerated light exercise without difficulty. VSS, telemetry-Sinus Rhythm, asymptomatic.  Medication list reconciled. Pt denies barriers to medicaiton compliance.  PSYCHOSOCIAL ASSESSMENT:  PHQ-0. Pt exhibits positive coping skills, hopeful outlook with supportive family. No psychosocial needs identified at this time, no psychosocial interventions necessary.  Shaquile has a history of depression and is taking an antidepressant. 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   Pt oriented to exercise equipment and routine.    Understanding verbalized. Patient was given smoking cessation information. Patient says he is not interested in quitting smoking at this time.Harrell Gave RN BSN    Dr. Fransico Him is Medical Director for Cardiac Rehab at [facility].

## 2021-11-15 ENCOUNTER — Encounter (HOSPITAL_COMMUNITY): Payer: No Typology Code available for payment source

## 2021-11-15 ENCOUNTER — Ambulatory Visit (HOSPITAL_COMMUNITY): Payer: Medicaid Other

## 2021-11-17 ENCOUNTER — Ambulatory Visit (HOSPITAL_COMMUNITY): Payer: Medicaid Other

## 2021-11-17 ENCOUNTER — Telehealth (HOSPITAL_COMMUNITY): Payer: Self-pay | Admitting: *Deleted

## 2021-11-17 ENCOUNTER — Encounter (HOSPITAL_COMMUNITY): Payer: No Typology Code available for payment source

## 2021-11-17 NOTE — Telephone Encounter (Signed)
Spoke with Ricardo Howard he had an echocardiogram today in [place]. Askia plans to return to exercise on Monday.Harrell Gave RN BSN

## 2021-11-20 ENCOUNTER — Ambulatory Visit (HOSPITAL_COMMUNITY): Payer: Medicaid Other

## 2021-11-20 ENCOUNTER — Encounter (HOSPITAL_COMMUNITY)
Admission: RE | Admit: 2021-11-20 | Discharge: 2021-11-20 | Disposition: A | Discharge status: Home / Self Care | Payer: No Typology Code available for payment source | Source: Ambulatory Visit | Attending: Cardiovascular Disease | Admitting: Cardiovascular Disease

## 2021-11-20 DIAGNOSIS — E1151 Type 2 diabetes mellitus with diabetic peripheral angiopathy without gangrene: Secondary | ICD-10-CM | POA: Diagnosis not present

## 2021-11-20 DIAGNOSIS — E1169 Type 2 diabetes mellitus with other specified complication: Secondary | ICD-10-CM | POA: Diagnosis not present

## 2021-11-20 DIAGNOSIS — I110 Hypertensive heart disease with heart failure: Secondary | ICD-10-CM | POA: Insufficient documentation

## 2021-11-20 DIAGNOSIS — Z89511 Acquired absence of right leg below knee: Secondary | ICD-10-CM | POA: Insufficient documentation

## 2021-11-20 DIAGNOSIS — I252 Old myocardial infarction: Secondary | ICD-10-CM | POA: Diagnosis not present

## 2021-11-20 DIAGNOSIS — Z955 Presence of coronary angioplasty implant and graft: Secondary | ICD-10-CM | POA: Insufficient documentation

## 2021-11-20 DIAGNOSIS — I2510 Atherosclerotic heart disease of native coronary artery without angina pectoris: Secondary | ICD-10-CM | POA: Insufficient documentation

## 2021-11-20 DIAGNOSIS — R000 Tachycardia, unspecified: Secondary | ICD-10-CM | POA: Insufficient documentation

## 2021-11-20 DIAGNOSIS — Z91199 Patient's noncompliance with other medical treatment and regimen due to unspecified reason: Secondary | ICD-10-CM | POA: Insufficient documentation

## 2021-11-20 DIAGNOSIS — I509 Heart failure, unspecified: Secondary | ICD-10-CM | POA: Insufficient documentation

## 2021-11-20 DIAGNOSIS — I5020 Unspecified systolic (congestive) heart failure: Secondary | ICD-10-CM | POA: Diagnosis not present

## 2021-11-20 DIAGNOSIS — E782 Mixed hyperlipidemia: Secondary | ICD-10-CM | POA: Diagnosis not present

## 2021-11-20 DIAGNOSIS — Z79899 Other long term (current) drug therapy: Secondary | ICD-10-CM | POA: Insufficient documentation

## 2021-11-20 DIAGNOSIS — G4733 Obstructive sleep apnea (adult) (pediatric): Secondary | ICD-10-CM | POA: Diagnosis not present

## 2021-11-20 DIAGNOSIS — Z7902 Long term (current) use of antithrombotics/antiplatelets: Secondary | ICD-10-CM | POA: Diagnosis not present

## 2021-11-20 DIAGNOSIS — F17210 Nicotine dependence, cigarettes, uncomplicated: Secondary | ICD-10-CM | POA: Insufficient documentation

## 2021-11-20 DIAGNOSIS — I214 Non-ST elevation (NSTEMI) myocardial infarction: Secondary | ICD-10-CM | POA: Diagnosis present

## 2021-11-20 DIAGNOSIS — R7309 Other abnormal glucose: Secondary | ICD-10-CM | POA: Diagnosis present

## 2021-11-20 LAB — GLUCOSE, CAPILLARY: Glucose-Capillary: 111 mg/dL — ABNORMAL HIGH (ref 70–99)

## 2021-11-20 NOTE — Progress Notes (Signed)
QUALITY OF LIFE SCORE REVIEW  Pt completed Quality of Life survey as a participant in Cardiac Rehab.  Scores 21.0 or below are considered low.  Pt score very low in several areas Overall 20.93, Health and Function 17.97, socioeconomic 21.14, physiological and spiritual 24, family 25.2. Patient quality of life slightly altered by physical constraints which limits ability to perform as prior to recent cardiac illness.Yvette denies being depressed currently. 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 Tilford says he is receiving home PT twice a week on his non cardiac rehab days. Rithik says he has cut his cigarette smoking to 3 cigarettes a  day. Phineas denies being depressed currently but has been previously depressed. Yoandri says he want to be there for his children and grandchildren. Offered emotional support and reassurance.  Will continue to monitor and intervene as necessary.  Will forward quality of life questionnaire to [facility] primary care provider at the [place].Harrell Gave RN BSN

## 2021-11-22 ENCOUNTER — Encounter (HOSPITAL_COMMUNITY): Payer: No Typology Code available for payment source

## 2021-11-22 ENCOUNTER — Ambulatory Visit (HOSPITAL_COMMUNITY): Payer: Medicaid Other

## 2021-11-24 ENCOUNTER — Encounter (HOSPITAL_COMMUNITY): Payer: No Typology Code available for payment source

## 2021-11-24 ENCOUNTER — Ambulatory Visit (HOSPITAL_COMMUNITY): Payer: Medicaid Other

## 2021-11-27 ENCOUNTER — Telehealth (HOSPITAL_COMMUNITY): Payer: Self-pay | Admitting: *Deleted

## 2021-11-27 ENCOUNTER — Encounter (HOSPITAL_COMMUNITY): Payer: No Typology Code available for payment source

## 2021-11-27 ENCOUNTER — Telehealth (HOSPITAL_COMMUNITY): Payer: Self-pay

## 2021-11-27 ENCOUNTER — Ambulatory Visit (HOSPITAL_COMMUNITY): Payer: Medicaid Other

## 2021-11-27 NOTE — Telephone Encounter (Signed)
Spoke with Ricardo Howard asked that her husband follow up with his primary care provider regarding his cold symptoms and to take a COVID 19 test and have negative results and improvement of symptoms before returning to exercise. Ricardo Stefanelli says that Ricardo Howard does not have a fever but is coughing and is hoarse. Patient's wife states understanding.Barnet Pall, RN,BSN 11/27/2021 1:29 PM

## 2021-11-28 NOTE — Progress Notes (Signed)
Cardiac Individual Treatment Plan  Patient Details  Name: Ricardo Howard MRN: [id number] Date of Birth: 1960-07-16 Referring Provider:   Flowsheet Row INTENSIVE CARDIAC REHAB ORIENT from 11/07/2021 in [facility] CARDIAC REHAB  Referring Provider Wendall Stade, MD       Initial Encounter Date:  Flowsheet Row INTENSIVE CARDIAC REHAB ORIENT from 11/07/2021 in [facility] CARDIAC REHAB  Date 11/07/21       Visit Diagnosis: 07/16/21 NSTEMI  07/16/21 DES RCA  Patient's Home Medications on Admission:  Current Outpatient Medications:    acetaminophen (TYLENOL) 500 MG tablet, Take 1,000 mg by mouth 2 (two) times daily as needed for mild pain., Disp: , Rfl:    aspirin EC 81 MG tablet, Take 81 mg by mouth in the morning., Disp: , Rfl:    atorvastatin (LIPITOR) 80 MG tablet, Take 1 tablet (80 mg total) by mouth at bedtime., Disp: 90 tablet, Rfl: 3   buPROPion (WELLBUTRIN SR) 150 MG 12 hr tablet, Take 150 mg by mouth in the morning and at bedtime., Disp: , Rfl:    carvedilol (COREG) 6.25 MG tablet, Take 1 tablet (6.25 mg total) by mouth 2 (two) times daily with a meal., Disp: 90 tablet, Rfl: 1   clopidogrel (PLAVIX) 75 MG tablet, Take 75 mg by mouth in the morning., Disp: , Rfl:    empagliflozin (JARDIANCE) 25 MG TABS tablet, Take 1 tablet (25 mg total) by mouth daily., Disp: 90 tablet, Rfl: 3   ezetimibe (ZETIA) 10 MG tablet, Take 1 tablet (10 mg total) by mouth daily., Disp: 90 tablet, Rfl: 3   folic acid (FOLVITE) 1 MG tablet, Take 1 tablet (1 mg total) by mouth daily., Disp: , Rfl:    furosemide (LASIX) 40 MG tablet, Take 1 tablet (40 mg total) by mouth 2 (two) times daily for 4 days, THEN 1 tablet (40 mg total) daily. (Patient taking differently: Take 1 tablet (40 mg total) by mouth daily.), Disp: 94 tablet, Rfl: 0   gabapentin (NEURONTIN) 300 MG capsule, Take 300-600 mg by mouth See admin instructions. Take 1 capsule (300 mg) by mouth in the morning,  take 1 capsule (300 mg) by mouth at lunch, & take 2 capsules (600 mg) by mouth at bedtime, Disp: , Rfl:    insulin aspart (NOVOLOG) 100 UNIT/ML injection, Inject 10 Units into the skin 3 (three) times daily with meals. (Patient taking differently: Inject 10-20 Units into the skin 3 (three) times daily with meals. Sliding Scale Insulin), Disp: 10 mL, Rfl: 11   insulin glargine (LANTUS) 100 UNIT/ML Solostar Pen, 30units in am and 35unit at night  Further adjustment per your pcp and endocrinology Goal of a1c is less than 7%, currently your a1c is 10%, not at goal (Patient taking differently: 30-35 Units See admin instructions. 30units in am and 35unit at night  Further adjustment per your pcp and endocrinology Goal of a1c is less than 7%, currently your a1c is 10%, not at goal), Disp: 15 mL, Rfl: 0   Insulin Pen Needle (NOVOFINE) 30G X 8 MM MISC, Inject 10 each into the skin as needed., Disp: 100 each, Rfl: 0   isosorbide mononitrate (IMDUR) 60 MG 24 hr tablet, Take 30 mg by mouth in the morning., Disp: , Rfl:    lipase/protease/amylase (CREON) 36000 UNITS CPEP capsule, Take 1 capsule (36,000 Units total) by mouth 3 (three) times daily before meals., Disp: 270 capsule, Rfl: 0   lurasidone (LATUDA) 40 MG TABS tablet, Take  40 mg by mouth every evening., Disp: , Rfl:    magnesium oxide (MAG-OX) 400 (241.3 Mg) MG tablet, Take 1 tablet (400 mg total) by mouth 2 (two) times daily., Disp: , Rfl:    mesalamine (LIALDA) 1.2 g EC tablet, Take 1.2 g by mouth in the morning., Disp: , Rfl:    naloxone (NARCAN) nasal spray 4 mg/0.1 mL, Place 1 spray into the nose as needed (opioid reversal)., Disp: , Rfl:    nitroGLYCERIN (NITROSTAT) 0.4 MG SL tablet, Place 1 tablet (0.4 mg total) under the tongue every 5 (five) minutes as needed for chest pain., Disp: 30 tablet, Rfl: 0   pantoprazole (PROTONIX) 40 MG tablet, Take 40 mg by mouth in the morning., Disp: , Rfl:    polyethylene glycol (MIRALAX / GLYCOLAX) 17 g packet, Take  17 g by mouth daily as needed for mild constipation., Disp: 14 each, Rfl: 0   potassium chloride SA (KLOR-CON M) 20 MEQ tablet, Take 1 tablet (20 mEq total) by mouth daily., Disp: 90 tablet, Rfl: 1   pregabalin (LYRICA) 150 MG capsule, Take 150 mg by mouth 2 (two) times daily., Disp: , Rfl:    sacubitril-valsartan (ENTRESTO) 24-26 MG, Take 1 tablet by mouth 2 (two) times daily., Disp: 60 tablet, Rfl: 0   sacubitril-valsartan (ENTRESTO) 24-26 MG, Take 1 tablet by mouth 2 (two) times daily., Disp: 180 tablet, Rfl: 3   sertraline (ZOLOFT) 100 MG tablet, Take 100 mg by mouth daily., Disp: , Rfl:    tamsulosin (FLOMAX) 0.4 MG CAPS capsule, Take 0.4 mg by mouth daily., Disp: , Rfl:    thiamine 100 MG tablet, Take 1 tablet (100 mg total) by mouth daily., Disp: , Rfl:    vitamin B-12 (CYANOCOBALAMIN) 500 MCG tablet, Take 500 mcg by mouth in the morning., Disp: , Rfl:   Past Medical History: Past Medical History:  Diagnosis Date   Alcohol abuse 06/14/2019   Anxiety    Back pain    Cataracts, bilateral    CHF (congestive heart failure) (HCC)    Depression    Diabetic retinopathy (HCC)    Essential hypertension 06/14/2019   Hepatitis C    cured   Hx of BKA, right (HCC)    Hyperlipidemia    Hypertension    Mixed hyperlipidemia due to type 2 diabetes mellitus (HCC) 07/31/2019   Nicotine dependence, cigarettes, uncomplicated 07/31/2019   PAD (peripheral artery disease) (HCC)    Pancreatitis    PTSD (post-traumatic stress disorder)    Uncontrolled type 2 diabetes mellitus with diabetic polyneuropathy, with long-term current use of insulin 07/31/2019    Tobacco Use: Social History   Tobacco Use  Smoking Status Every Day   Packs/day: 0.25   Types: Cigarettes  Smokeless Tobacco Never    Labs: Review Flowsheet  More data exists      Latest Ref Rng & Units 07/21/2020 12/02/2020 05/31/2021 06/03/2021 07/16/2021  Labs for ITP Cardiac and Pulmonary Rehab  Cholestrol 0 - 200 mg/dL - - - - 621249    LDL (calc) 0 - 99 mg/dL - - - - 308193   HDL-C >65>40 mg/dL - - - - 44   Trlycerides <150 mg/dL - - - - 58   Hemoglobin A1c 4.8 - 5.6 % 10.4  - - 10.1  10.9   Bicarbonate 20.0 - 28.0 mmol/L - - 14.9  - 27.3   TCO2 22 - 32 mmol/L - 24  16  - 29   Acid-base deficit 0.0 - 2.0  mmol/L - - 7.0  - -  O2 Saturation % - - 96  - 99     Capillary Blood Glucose: Lab Results  Component Value Date   GLUCAP 111 (H) 11/20/2021   GLUCAP 210 (H) 11/13/2021   GLUCAP 233 (H) 11/13/2021   GLUCAP 241 (H) 07/19/2021   GLUCAP 146 (H) 07/19/2021     Exercise Target Goals: Exercise Program Goal: Individual exercise prescription set using results from initial 6 min walk test and THRR while considering  patient's activity barriers and safety.   Exercise Prescription Goal: Initial exercise prescription builds to 30-45 minutes a day of aerobic activity, 2-3 days per week.  Home exercise guidelines will be given to patient during program as part of exercise prescription that the participant will acknowledge.  Activity Barriers & Risk Stratification:  Activity Barriers & Cardiac Risk Stratification - 11/07/21 1028       Activity Barriers & Cardiac Risk Stratification   Activity Barriers Other (comment);Assistive Device;History of Falls    Comments Right leg below knee amputee    Cardiac Risk Stratification High             6 Minute Walk:  6 Minute Walk     Row Name 11/07/21 1042         6 Minute Walk   Phase Initial     Distance 600 feet     Walk Time 6 minutes     # of Rest Breaks 2  seated rest breaks     MPH 1.14     METS 1.65     RPE 13     Perceived Dyspnea  2     VO2 Peak 5.76     Symptoms Yes (comment)     Comments Patient c/o mild SOB, RPD=2, right side pain, which started this morning, "4/10" on the pain scale.     Resting HR 90 bpm     Resting BP 113/62     Resting Oxygen Saturation  98 %     Exercise Oxygen Saturation  during 6 min walk 95 %     Max Ex. HR 99 bpm     Max Ex.  BP 118/82     2 Minute Post BP 124/82       Interval HR   1 Minute HR 87     2 Minute HR 92     3 Minute HR 97     4 Minute HR 95     5 Minute HR 93     6 Minute HR 99     2 Minute Post HR 87     Interval Heart Rate? Yes       Interval Oxygen   Interval Oxygen? Yes     Baseline Oxygen Saturation % 98 %     1 Minute Oxygen Saturation % 95 %     2 Minute Oxygen Saturation % 95 %     3 Minute Oxygen Saturation % 95 %     4 Minute Oxygen Saturation % 96 %     5 Minute Oxygen Saturation % 100 %     6 Minute Oxygen Saturation % 96 %     2 Minute Post Oxygen Saturation % 97 %              Oxygen Initial Assessment:   Oxygen Re-Evaluation:   Oxygen Discharge (Final Oxygen Re-Evaluation):   Initial Exercise Prescription:  Initial Exercise Prescription - 11/07/21 1100  Date of Initial Exercise RX and Referring Provider   Date 11/07/21    Referring Provider Wendall Stade, MD    Expected Discharge Date 01/05/22      T5 Nustep   Level 1    SPM 85    Minutes 30    METs 1.5      Prescription Details   Frequency (times per week) 3    Duration Progress to 30 minutes of continuous aerobic without signs/symptoms of physical distress      Intensity   THRR 40-80% of Max Heartrate 63-126    Ratings of Perceived Exertion 11-13    Perceived Dyspnea 0-4      Progression   Progression Continue to progress workloads to maintain intensity without signs/symptoms of physical distress.      Resistance Training   Training Prescription Yes    Weight 4 lbs    Reps 10-15             Perform Capillary Blood Glucose checks as needed.  Exercise Prescription Changes:   Exercise Prescription Changes     Row Name 11/13/21 1027 11/20/21 1021           Response to Exercise   Blood Pressure (Admit) 95/66 106/60      Blood Pressure (Exercise) 116/73 108/82      Blood Pressure (Exit) 106/71 98/64      Heart Rate (Admit) 89 bpm 87 bpm      Heart Rate (Exercise) 97  bpm 96 bpm      Heart Rate (Exit) 89 bpm 84 bpm      Rating of Perceived Exertion (Exercise) 13 12      Symptoms None None      Comments Off to a good start with exercise. --      Duration Progress to 30 minutes of  aerobic without signs/symptoms of physical distress Progress to 30 minutes of  aerobic without signs/symptoms of physical distress      Intensity THRR unchanged THRR unchanged        Progression   Progression Continue to progress workloads to maintain intensity without signs/symptoms of physical distress. Continue to progress workloads to maintain intensity without signs/symptoms of physical distress.      Average METs 1.9 2.1        Resistance Training   Training Prescription Yes No  CBG dropped, eating snack, no weights today.      Weight 4 lbs --      Reps 10-15 --      Time 10 Minutes --        Interval Training   Interval Training No No        T5 Nustep   Level 1 1      SPM 75 75      Minutes 20 30      METs 1.9 2.1               Exercise Comments:   Exercise Comments     Row Name 11/13/21 1130 11/20/21 1050         Exercise Comments Patient tolerated low intensity exercise fairly well, some shoulder discomfort using the NuStep machine. Reviewed METs and goals with patient.               Exercise Goals and Review:   Exercise Goals     Row Name 10/17/21 1309 11/07/21 1028           Exercise Goals   Increase Physical Activity  Yes Yes      Intervention Provide advice, education, support and counseling about physical activity/exercise needs.;Develop an individualized exercise prescription for aerobic and resistive training based on initial evaluation findings, risk stratification, comorbidities and participant's personal goals. Provide advice, education, support and counseling about physical activity/exercise needs.;Develop an individualized exercise prescription for aerobic and resistive training based on initial evaluation findings, risk  stratification, comorbidities and participant's personal goals.      Expected Outcomes Short Term: Attend rehab on a regular basis to increase amount of physical activity.;Long Term: Exercising regularly at least 3-5 days a week.;Long Term: Add in home exercise to make exercise part of routine and to increase amount of physical activity. Short Term: Attend rehab on a regular basis to increase amount of physical activity.;Long Term: Exercising regularly at least 3-5 days a week.;Long Term: Add in home exercise to make exercise part of routine and to increase amount of physical activity.      Increase Strength and Stamina Yes Yes      Intervention Provide advice, education, support and counseling about physical activity/exercise needs.;Develop an individualized exercise prescription for aerobic and resistive training based on initial evaluation findings, risk stratification, comorbidities and participant's personal goals. Provide advice, education, support and counseling about physical activity/exercise needs.;Develop an individualized exercise prescription for aerobic and resistive training based on initial evaluation findings, risk stratification, comorbidities and participant's personal goals.      Expected Outcomes Short Term: Increase workloads from initial exercise prescription for resistance, speed, and METs.;Short Term: Perform resistance training exercises routinely during rehab and add in resistance training at home;Long Term: Improve cardiorespiratory fitness, muscular endurance and strength as measured by increased METs and functional capacity ( ) Short Term: Increase workloads from initial exercise prescription for resistance, speed, and METs.;Short Term: Perform resistance training exercises routinely during rehab and add in resistance training at home;Long Term: Improve cardiorespiratory fitness, muscular endurance and strength as measured by increased METs and functional capacity ( )       Able to understand and use rate of perceived exertion (RPE) scale Yes Yes      Intervention Provide education and explanation on how to use RPE scale Provide education and explanation on how to use RPE scale      Expected Outcomes Short Term: Able to use RPE daily in rehab to express subjective intensity level;Long Term:  Able to use RPE to guide intensity level when exercising independently Short Term: Able to use RPE daily in rehab to express subjective intensity level;Long Term:  Able to use RPE to guide intensity level when exercising independently      Knowledge and understanding of Target Heart Rate Range (THRR) Yes Yes      Intervention Provide education and explanation of THRR including how the numbers were predicted and where they are located for reference Provide education and explanation of THRR including how the numbers were predicted and where they are located for reference      Expected Outcomes Short Term: Able to state/look up THRR;Long Term: Able to use THRR to govern intensity when exercising independently;Short Term: Able to use daily as guideline for intensity in rehab Short Term: Able to state/look up THRR;Long Term: Able to use THRR to govern intensity when exercising independently;Short Term: Able to use daily as guideline for intensity in rehab      Understanding of Exercise Prescription Yes Yes      Intervention Provide education, explanation, and written materials on patient's individual exercise prescription Provide education, explanation, and written  materials on patient's individual exercise prescription      Expected Outcomes Short Term: Able to explain program exercise prescription;Long Term: Able to explain home exercise prescription to exercise independently Short Term: Able to explain program exercise prescription;Long Term: Able to explain home exercise prescription to exercise independently               Exercise Goals Re-Evaluation :  Exercise Goals Re-Evaluation      Row Name 11/13/21 1130 11/20/21 1050           Exercise Goal Re-Evaluation   Exercise Goals Review Increase Physical Activity;Able to understand and use rate of perceived exertion (RPE) scale;Increase Strength and Stamina Increase Physical Activity;Able to understand and use rate of perceived exertion (RPE) scale;Increase Strength and Stamina      Comments Patient able to understand and use RPE scale appropriately. Patient tolerates low intensity exercise well without symptoms. Patient's goal is to increase strength and balance, be able to walk greater distance, and be able to ride his outdoor bike again. Patient has not ridden his bike since his below the knee amputation. I advised patient that return to riding his bike would be something he would need to discuss with his physical therapist due to safety concerns and mechanics. Our goal will be to increase his strength and stamina.      Expected Outcomes Progress workloads as tolerated to help increase strength and stamina. Continue to progress workloads as tolerated to help increase strength and stamina, so that patient can do the activities he enjoys.               Discharge Exercise Prescription (Final Exercise Prescription Changes):  Exercise Prescription Changes - 11/20/21 1021       Response to Exercise   Blood Pressure (Admit) 106/60    Blood Pressure (Exercise) 108/82    Blood Pressure (Exit) 98/64    Heart Rate (Admit) 87 bpm    Heart Rate (Exercise) 96 bpm    Heart Rate (Exit) 84 bpm    Rating of Perceived Exertion (Exercise) 12    Symptoms None    Duration Progress to 30 minutes of  aerobic without signs/symptoms of physical distress    Intensity THRR unchanged      Progression   Progression Continue to progress workloads to maintain intensity without signs/symptoms of physical distress.    Average METs 2.1      Resistance Training   Training Prescription No   CBG dropped, eating snack, no weights today.      Interval Training   Interval Training No      T5 Nustep   Level 1    SPM 75    Minutes 30    METs 2.1             Nutrition:  Target Goals: Understanding of nutrition guidelines, daily intake of sodium 1500mg , cholesterol 200mg , calories 30% from fat and 7% or less from saturated fats, daily to have 5 or more servings of fruits and vegetables.  Biometrics:  Pre Biometrics - 11/07/21 0955       Pre Biometrics   Waist Circumference 45.25 inches    Hip Circumference 45 inches    Waist to Hip Ratio 1.01 %    Triceps Skinfold 19 mm    % Body Fat 33 %    Grip Strength 44 kg    Flexibility --   Not performed, right BKA   Single Leg Stand --   Not performed, right BKA  Nutrition Therapy Plan and Nutrition Goals:  Nutrition Therapy & Goals - 11/13/21 1334       Nutrition Therapy   Diet Heart Healthy Carbohydrate Consistent    Drug/Food Interactions Statins/Certain Fruits      Personal Nutrition Goals   Nutrition Goal Patient to identify strategies for managing cardiovascular risk by attending the weekly [employer] education and nutrition courses    Personal Goal #2 Patient to reduce sodium intake to 1500mg  per day    Personal Goal #3 Patient to identify food sources and reduce daily intake of saturated fat, trans fat, refined carbohydrates, and sodium    Comments Patient and his wife plan to attend cardiac rehab education together. His wife does the majority of the grocery shopping and cooking though he does enjoy cooking. Most recent labs show A1c of 10.9, cholesterol 249, LDL 193, Lipoprotein A 71.1.      Intervention Plan   Intervention Prescribe, educate and counsel regarding individualized specific dietary modifications aiming towards targeted core components such as weight, hypertension, lipid management, diabetes, heart failure and other comorbidities.;Nutrition handout(s) given to patient.    Expected Outcomes Short Term Goal: Understand basic  principles of dietary content, such as calories, fat, sodium, cholesterol and nutrients.;Long Term Goal: Adherence to prescribed nutrition plan.             Nutrition Assessments:  MEDIFICTS Score Key: ?70 Need to make dietary changes  40-70 Heart Healthy Diet ? 40 Therapeutic Level Cholesterol Diet    Picture Your Plate Scores: <29 Unhealthy dietary pattern with much room for improvement. 41-50 Dietary pattern unlikely to meet recommendations for good health and room for improvement. 51-60 More healthful dietary pattern, with some room for improvement.  >60 Healthy dietary pattern, although there may be some specific behaviors that could be improved.    Nutrition Goals Re-Evaluation:  Nutrition Goals Re-Evaluation     Row Name 11/13/21 1334             Goals   Current Weight 231 lb (104.8 kg)       Comment Most recent labs show A1c of 10.9, cholesterol 249, LDL 193, Lipoprotein A 71.1.       Expected Outcome Patient and his wife plan to attend cardiac rehab education together. His wife does the majority of the grocery shopping and cooking though he does enjoy cooking.                Nutrition Goals Re-Evaluation:  Nutrition Goals Re-Evaluation     Row Name 11/13/21 1334             Goals   Current Weight 231 lb (104.8 kg)       Comment Most recent labs show A1c of 10.9, cholesterol 249, LDL 193, Lipoprotein A 71.1.       Expected Outcome Patient and his wife plan to attend cardiac rehab education together. His wife does the majority of the grocery shopping and cooking though he does enjoy cooking.                Nutrition Goals Discharge (Final Nutrition Goals Re-Evaluation):  Nutrition Goals Re-Evaluation - 11/13/21 1334       Goals   Current Weight 231 lb (104.8 kg)    Comment Most recent labs show A1c of 10.9, cholesterol 249, LDL 193, Lipoprotein A 71.1.    Expected Outcome Patient and his wife plan to attend cardiac rehab education together.  His wife does the majority of the grocery shopping  and cooking though he does enjoy cooking.             Psychosocial: Target Goals: Acknowledge presence or absence of significant depression and/or stress, maximize coping skills, provide positive support system. Participant is able to verbalize types and ability to use techniques and skills needed for reducing stress and depression.  Initial Review & Psychosocial Screening:  Initial Psych Review & Screening - 11/07/21 1037       Initial Review   Current issues with History of Depression      Family Dynamics   Good Support System? Yes      Barriers   Psychosocial barriers to participate in program There are no identifiable barriers or psychosocial needs.      Screening Interventions   Interventions Encouraged to exercise;To provide support and resources with identified psychosocial needs;Provide feedback about the scores to participant    Expected Outcomes Short Term goal: Utilizing psychosocial counselor, staff and physician to assist with identification of specific Stressors or current issues interfering with healing process. Setting desired goal for each stressor or current issue identified.;Long Term Goal: Stressors or current issues are controlled or eliminated.;Short Term goal: Identification and review with participant of any Quality of Life or Depression concerns found by scoring the questionnaire.;Long Term goal: The participant improves quality of Life and PHQ9 Scores as seen by post scores and/or verbalization of changes             Quality of Life Scores:  Quality of Life - 11/07/21 1133       Quality of Life   Select Quality of Life      Quality of Life Scores   Health/Function Pre 17.97 %    Socioeconomic Pre 21.14 %    Psych/Spiritual Pre 24 %    Family Pre 25.2 %    GLOBAL Pre 20.93 %            Scores of 19 and below usually indicate a poorer quality of life in these areas.  A difference of  2-3  points is a clinically meaningful difference.  A difference of 2-3 points in the total score of the Quality of Life Index has been associated with significant improvement in overall quality of life, self-image, physical symptoms, and general health in studies assessing change in quality of life.  PHQ-9: Review Flowsheet       11/07/2021  Depression screen PHQ 2/9  Decreased Interest 0  Down, Depressed, Hopeless 0  PHQ - 2 Score 0   Interpretation of Total Score  Total Score Depression Severity:  1-4 = Minimal depression, 5-9 = Mild depression, 10-14 = Moderate depression, 15-19 = Moderately severe depression, 20-27 = Severe depression   Psychosocial Evaluation and Intervention:   Psychosocial Re-Evaluation:  Psychosocial Re-Evaluation     Row Name 11/13/21 1427 11/28/21 1426           Psychosocial Re-Evaluation   Current issues with History of Depression History of Depression      Comments Will review quality of life questionnaire in the upcoming week? Quality of life survey reviewed. Korion denies being currently depressed with forward results to primary care at the [facility] per patient's request.      Expected Outcomes [place] will have decreaed depression upon completion of intensive cardiac rehab [place] will have decreaed depression upon completion of intensive cardiac rehab      Interventions Stress management education;Encouraged to attend Cardiac Rehabilitation for the exercise Stress management education;Encouraged to attend Cardiac Rehabilitation for  the exercise      Continue Psychosocial Services  Follow up required by staff Follow up required by staff               Psychosocial Discharge (Final Psychosocial Re-Evaluation):  Psychosocial Re-Evaluation - 11/28/21 1426       Psychosocial Re-Evaluation   Current issues with History of Depression    Comments Quality of life survey reviewed. Glennon denies being currently depressed with forward results to primary care at the [facility] per  patient's request.    Expected Outcomes P[place]itt will have decreaed depression upon completion of intensive cardiac rehab    Interventions Stress management education;Encouraged to attend Cardiac Rehabilitation for the exercise    Continue Psychosocial Services  Follow up required by staff             Vocational Rehabilitation: Provide vocational rehab assistance to qualifying candidates.   Vocational Rehab Evaluation & Intervention:  Vocational Rehab - 11/07/21 1052       Initial Vocational Rehab Evaluation & Intervention   Assessment shows need for Vocational Rehabilitation No      Vocational Rehab Re-Evaulation   Comments retired             Education: Education Goals: Education classes will be provided on a weekly basis, covering required topics. Participant will state understanding/return demonstration of topics presented.    Education     Row Name 11/20/21 1600     Education   Cardiac Education Topics Pritikin   Licensed conveyancer Nutrition   Nutrition Dining Out - Part 1   Instruction Review Code 1- Verbalizes Understanding   Class Start Time 1137   Class Stop Time 1220   Class Time Calculation (min) 43 min            Core Videos: Exercise    Move It!  Clinical staff conducted group or individual video education with verbal and written material and guidebook.  Patient learns the recommended Pritikin exercise program. Exercise with the goal of living a long, healthy life. Some of the health benefits of exercise include controlled diabetes, healthier blood pressure levels, improved cholesterol levels, improved heart and lung capacity, improved sleep, and better body composition. Everyone should speak with their doctor before starting or changing an exercise routine.  Biomechanical Limitations Clinical staff conducted group or individual video education with verbal and written material and guidebook.  Patient  learns how biomechanical limitations can impact exercise and how we can mitigate and possibly overcome limitations to have an impactful and balanced exercise routine.  Body Composition Clinical staff conducted group or individual video education with verbal and written material and guidebook.  Patient learns that body composition (ratio of muscle mass to fat mass) is a key component to assessing overall fitness, rather than body weight alone. Increased fat mass, especially visceral belly fat, can put [place] at increased risk for metabolic syndrome, type 2 diabetes, heart disease, and even death. It is recommended to combine diet and exercise (cardiovascular and resistance training) to improve your body composition. Seek guidance from your physician and exercise physiologist before implementing an exercise routine.  Exercise Action Plan Clinical staff conducted group or individual video education with verbal and written material and guidebook.  Patient learns the recommended strategies to achieve and enjoy long-term exercise adherence, including variety, self-motivation, self-efficacy, and positive decision making. Benefits of exercise include fitness, good health, weight management, more energy, better  sleep, less stress, and overall well-being.  Medical   Heart Disease Risk Reduction Clinical staff conducted group or individual video education with verbal and written material and guidebook.  Patient learns our heart is our most vital organ as it circulates oxygen, nutrients, white blood cells, and hormones throughout the entire body, and carries waste away. Data supports a plant-based eating plan like the Pritikin Program for its effectiveness in slowing progression of and reversing heart disease. The video provides a number of recommendations to address heart disease.   Metabolic Syndrome and Belly Fat  Clinical staff conducted group or individual video education with verbal and written material and  guidebook.  Patient learns what metabolic syndrome is, how it leads to heart disease, and how one can reverse it and keep it from coming back. You have metabolic syndrome if you have 3 of the following 5 criteria: abdominal obesity, high blood pressure, high triglycerides, low HDL cholesterol, and high blood sugar.  Hypertension and Heart Disease Clinical staff conducted group or individual video education with verbal and written material and guidebook.  Patient learns that high blood pressure, or hypertension, is very common in the [place]. Hypertension is largely due to excessive salt intake, but other important risk factors include being overweight, physical inactivity, drinking too much alcohol, smoking, and not eating enough potassium from fruits and vegetables. High blood pressure is a leading risk factor for heart attack, stroke, congestive heart failure, dementia, kidney failure, and premature death. Long-term effects of excessive salt intake include stiffening of the arteries and thickening of heart muscle and organ damage. Recommendations include ways to reduce hypertension and the risk of heart disease.  Diseases of Our Time - Focusing on Diabetes Clinical staff conducted group or individual video education with verbal and written material and guidebook.  Patient learns why the best way to stop diseases of our time is prevention, through food and other lifestyle changes. Medicine (such as prescription pills and surgeries) is often only a Band-Aid on the problem, not a long-term solution. Most common diseases of our time include obesity, type 2 diabetes, hypertension, heart disease, and cancer. The Pritikin Program is recommended and has been proven to help reduce, reverse, and/or prevent the damaging effects of metabolic syndrome.  Nutrition   Overview of the Pritikin Eating Plan  Clinical staff conducted group or individual video education with verbal and written material and  guidebook.  Patient learns about the Pritikin Eating Plan for disease risk reduction. The Pritikin Eating Plan emphasizes a wide variety of unrefined, minimally-processed carbohydrates, like fruits, vegetables, whole grains, and legumes. Go, Caution, and Stop food choices are explained. Plant-based and lean animal proteins are emphasized. Rationale provided for low sodium intake for blood pressure control, low added sugars for blood sugar stabilization, and low added fats and oils for coronary artery disease risk reduction and weight management.  Calorie Density  Clinical staff conducted group or individual video education with verbal and written material and guidebook.  Patient learns about calorie density and how it impacts the Pritikin Eating Plan. Knowing the characteristics of the food you choose will help you decide whether those foods will lead to weight gain or weight loss, and whether you want to consume more or less of them. Weight loss is usually a side effect of the Pritikin Eating Plan because of its focus on low calorie-dense foods.  Label Reading  Clinical staff conducted group or individual video education with verbal and written material and guidebook.  Patient learns  about the Pritikin recommended label reading guidelines and corresponding recommendations regarding calorie density, added sugars, sodium content, and whole grains.  Dining Out - Part 1  Clinical staff conducted group or individual video education with verbal and written material and guidebook.  Patient learns that restaurant meals can be sabotaging because they can be so high in calories, fat, sodium, and/or sugar. Patient learns recommended strategies on how to positively address this and avoid unhealthy pitfalls.  Facts on Fats  Clinical staff conducted group or individual video education with verbal and written material and guidebook.  Patient learns that lifestyle modifications can be just as effective, if not  more so, as many medications for lowering your risk of heart disease. A Pritikin lifestyle can help to reduce your risk of inflammation and atherosclerosis (cholesterol build-up, or plaque, in the artery walls). Lifestyle interventions such as dietary choices and physical activity address the cause of atherosclerosis. A review of the types of fats and their impact on blood cholesterol levels, along with dietary recommendations to reduce fat intake is also included.  Nutrition Action Plan  Clinical staff conducted group or individual video education with verbal and written material and guidebook.  Patient learns how to incorporate Pritikin recommendations into their lifestyle. Recommendations include planning and keeping personal health goals in mind as an important part of their success.  Healthy Mind-Set    Healthy Minds, Bodies, Hearts  Clinical staff conducted group or individual video education with verbal and written material and guidebook.  Patient learns how to identify when they are stressed. Video will discuss the impact of that stress, as well as the many benefits of stress management. Patient will also be introduced to stress management techniques. The way we think, act, and feel has an impact on our hearts.  How Our Thoughts Can Heal Our Hearts  Clinical staff conducted group or individual video education with verbal and written material and guidebook.  Patient learns that negative thoughts can cause depression and anxiety. This can result in negative lifestyle behavior and serious health problems. Cognitive behavioral therapy is an effective method to help control our thoughts in order to change and improve our emotional outlook.  Additional Videos:  Exercise    Improving Performance  Clinical staff conducted group or individual video education with verbal and written material and guidebook.  Patient learns to use a non-linear approach by alternating intensity levels and lengths of  time spent exercising to help burn more calories and lose more body fat. Cardiovascular exercise helps improve heart health, metabolism, hormonal balance, blood sugar control, and recovery from fatigue. Resistance training improves strength, endurance, balance, coordination, reaction time, metabolism, and muscle mass. Flexibility exercise improves circulation, posture, and balance. Seek guidance from your physician and exercise physiologist before implementing an exercise routine and learn your capabilities and proper form for all exercise.  Introduction to Yoga  Clinical staff conducted group or individual video education with verbal and written material and guidebook.  Patient learns about yoga, a discipline of the coming together of mind, breath, and body. The benefits of yoga include improved flexibility, improved range of motion, better posture and core strength, increased lung function, weight loss, and positive self-image. Yoga's heart health benefits include lowered blood pressure, healthier heart rate, decreased cholesterol and triglyceride levels, improved immune function, and reduced stress. Seek guidance from your physician and exercise physiologist before implementing an exercise routine and learn your capabilities and proper form for all exercise.  Medical   Aging: Enhancing Your Quality  of Life  Clinical staff conducted group or individual video education with verbal and written material and guidebook.  Patient learns key strategies and recommendations to stay in good physical health and enhance quality of life, such as prevention strategies, having an advocate, securing a Health Care Proxy and Power of Attorney, and keeping a list of medications and system for tracking them. It also discusses how to avoid risk for bone loss.  Biology of Weight Control  Clinical staff conducted group or individual video education with verbal and written material and guidebook.  Patient learns that weight  gain occurs because we consume more calories than we burn (eating more, moving less). Even if your body weight is normal, you may have higher ratios of fat compared to muscle mass. Too much body fat puts you at increased risk for cardiovascular disease, heart attack, stroke, type 2 diabetes, and obesity-related cancers. In addition to exercise, following the Pritikin Eating Plan can help reduce your risk.  Decoding Lab Results  Clinical staff conducted group or individual video education with verbal and written material and guidebook.  Patient learns that lab test reflects one measurement whose values change over time and are influenced by many factors, including medication, stress, sleep, exercise, food, hydration, pre-existing medical conditions, and more. It is recommended to use the knowledge from this video to become more involved with your lab results and evaluate your numbers to speak with your doctor.   Diseases of Our Time - Overview  Clinical staff conducted group or individual video education with verbal and written material and guidebook.  Patient learns that according to the CDC, 50% to 70% of chronic diseases (such as obesity, type 2 diabetes, elevated lipids, hypertension, and heart disease) are avoidable through lifestyle improvements including healthier food choices, listening to satiety cues, and increased physical activity.  Sleep Disorders Clinical staff conducted group or individual video education with verbal and written material and guidebook.  Patient learns how good quality and duration of sleep are important to overall health and well-being. Patient also learns about sleep disorders and how they impact health along with recommendations to address them, including discussing with a physician.  Nutrition  Dining Out - Part 2 Clinical staff conducted group or individual video education with verbal and written material and guidebook.  Patient learns how to plan ahead and  communicate in order to maximize their dining experience in a healthy and nutritious manner. Included are recommended food choices based on the type of restaurant the patient is visiting.   Fueling a Banker conducted group or individual video education with verbal and written material and guidebook.  There is a strong connection between our food choices and our health. Diseases like obesity and type 2 diabetes are very prevalent and are in large-part due to lifestyle choices. The Pritikin Eating Plan provides plenty of food and hunger-curbing satisfaction. It is easy to follow, affordable, and helps reduce health risks.  Menu Workshop  Clinical staff conducted group or individual video education with verbal and written material and guidebook.  Patient learns that restaurant meals can sabotage health goals because they are often packed with calories, fat, sodium, and sugar. Recommendations include strategies to plan ahead and to communicate with the manager, chef, or server to help order a healthier meal.  Planning Your Eating Strategy  Clinical staff conducted group or individual video education with verbal and written material and guidebook.  Patient learns about the Pritikin Eating Plan and its benefit of  reducing the risk of disease. The Pritikin Eating Plan does not focus on calories. Instead, it emphasizes high-quality, nutrient-rich foods. By knowing the characteristics of the foods, we choose, we can determine their calorie density and make informed decisions.  Targeting Your Nutrition Priorities  Clinical staff conducted group or individual video education with verbal and written material and guidebook.  Patient learns that lifestyle habits have a tremendous impact on disease risk and progression. This video provides eating and physical activity recommendations based on your personal health goals, such as reducing LDL cholesterol, losing weight, preventing or controlling  type 2 diabetes, and reducing high blood pressure.  Vitamins and Minerals  Clinical staff conducted group or individual video education with verbal and written material and guidebook.  Patient learns different ways to obtain key vitamins and minerals, including through a recommended healthy diet. It is important to discuss all supplements you take with your doctor.   Healthy Mind-Set    Smoking Cessation  Clinical staff conducted group or individual video education with verbal and written material and guidebook.  Patient learns that cigarette smoking and tobacco addiction pose a serious health risk which affects millions of people. Stopping smoking will significantly reduce the risk of heart disease, lung disease, and many forms of cancer. Recommended strategies for quitting are covered, including working with your doctor to develop a successful plan.  Culinary   Becoming a Set designer conducted group or individual video education with verbal and written material and guidebook.  Patient learns that cooking at home can be healthy, cost-effective, quick, and puts them in control. Keys to cooking healthy recipes will include looking at your recipe, assessing your equipment needs, planning ahead, making it simple, choosing cost-effective seasonal ingredients, and limiting the use of added fats, salts, and sugars.  Cooking - Breakfast and Snacks  Clinical staff conducted group or individual video education with verbal and written material and guidebook.  Patient learns how important breakfast is to satiety and nutrition through the entire day. Recommendations include key foods to eat during breakfast to help stabilize blood sugar levels and to prevent overeating at meals later in the day. Planning ahead is also a key component.  Cooking - Educational psychologist conducted group or individual video education with verbal and written material and guidebook.  Patient learns  eating strategies to improve overall health, including an approach to cook more at home. Recommendations include thinking of animal protein as a side on your plate rather than center stage and focusing instead on lower calorie dense options like vegetables, fruits, whole grains, and plant-based proteins, such as beans. Making sauces in large quantities to freeze for later and leaving the skin on your vegetables are also recommended to maximize your experience.  Cooking - Healthy Salads and Dressing Clinical staff conducted group or individual video education with verbal and written material and guidebook.  Patient learns that vegetables, fruits, whole grains, and legumes are the foundations of the Pritikin Eating Plan. Recommendations include how to incorporate each of these in flavorful and healthy salads, and how to create homemade salad dressings. Proper handling of ingredients is also covered. Cooking - Soups and [employer] - Soups and Desserts Clinical staff conducted group or individual video education with verbal and written material and guidebook.  Patient learns that Pritikin soups and desserts make for easy, nutritious, and delicious snacks and meal components that are low in sodium, fat, sugar, and calorie density, while high in vitamins,  minerals, and filling fiber. Recommendations include simple and healthy ideas for soups and desserts.   Overview     The Pritikin Solution Program Overview Clinical staff conducted group or individual video education with verbal and written material and guidebook.  Patient learns that the results of the Pritikin Program have been documented in more than 100 articles published in peer-reviewed journals, and the benefits include reducing risk factors for (and, in some cases, even reversing) high cholesterol, high blood pressure, type 2 diabetes, obesity, and more! An overview of the three key pillars of the Pritikin Program will be covered: eating well,  doing regular exercise, and having a healthy mind-set.  WORKSHOPS  Exercise: Exercise Basics: Building Your Action Plan Clinical staff led group instruction and group discussion with PowerPoint presentation and patient guidebook. To enhance the learning environment the use of posters, models and videos may be added. At the conclusion of this workshop, patients will comprehend the difference between physical activity and exercise, as well as the benefits of incorporating both, into their routine. Patients will understand the FITT (Frequency, Intensity, Time, and Type) principle and how to use it to build an exercise action plan. In addition, safety concerns and other considerations for exercise and cardiac rehab will be addressed by the presenter. The purpose of this lesson is to promote a comprehensive and effective weekly exercise routine in order to improve patients' overall level of fitness.   Managing Heart Disease: Your Path to a Healthier Heart Clinical staff led group instruction and group discussion with PowerPoint presentation and patient guidebook. To enhance the learning environment the use of posters, models and videos may be added.At the conclusion of this workshop, patients will understand the anatomy and physiology of the heart. Additionally, they will understand how Pritikin's three pillars impact the risk factors, the progression, and the management of heart disease.  The purpose of this lesson is to provide a high-level overview of the heart, heart disease, and how the Pritikin lifestyle positively impacts risk factors.  Exercise Biomechanics Clinical staff led group instruction and group discussion with PowerPoint presentation and patient guidebook. To enhance the learning environment the use of posters, models and videos may be added. Patients will learn how the structural parts of their bodies function and how these functions impact their daily activities, movement, and  exercise. Patients will learn how to promote a neutral spine, learn how to manage pain, and identify ways to improve their physical movement in order to promote healthy living. The purpose of this lesson is to expose patients to common physical limitations that impact physical activity. Participants will learn practical ways to adapt and manage aches and pains, and to minimize their effect on regular exercise. Patients will learn how to maintain good posture while sitting, walking, and lifting.  Balance Training and Fall Prevention  Clinical staff led group instruction and group discussion with PowerPoint presentation and patient guidebook. To enhance the learning environment the use of posters, models and videos may be added. At the conclusion of this workshop, patients will understand the importance of their sensorimotor skills (vision, proprioception, and the vestibular system) in maintaining their ability to balance as they age. Patients will apply a variety of balancing exercises that are appropriate for their current level of function. Patients will understand the common causes for poor balance, possible solutions to these problems, and ways to modify their physical environment in order to minimize their fall risk. The purpose of this lesson is to teach patients about the importance  of maintaining balance as they age and ways to minimize their risk of falling.  WORKSHOPS   Nutrition:  Fueling a Ship broker led group instruction and group discussion with PowerPoint presentation and patient guidebook. To enhance the learning environment the use of posters, models and videos may be added. Patients will review the foundational principles of the Pritikin Eating Plan and understand what constitutes a serving size in each of the food groups. Patients will also learn Pritikin-friendly foods that are better choices when away from home and review make-ahead meal and snack options.  Calorie density will be reviewed and applied to three nutrition priorities: weight maintenance, weight loss, and weight gain. The purpose of this lesson is to reinforce (in a group setting) the key concepts around what patients are recommended to eat and how to apply these guidelines when away from home by planning and selecting Prit[facility]in-friendly options. Patients will understand how calorie density may be adjusted for different weight management goals.  Mindful Eating  Clinical staff led group instruction and group discussion with PowerPoint presentation and patient guidebook. To enhance the learning environment the use of posters, models and videos may be added. Patients will briefly review the concepts of the [facility]in Eating Plan and the importance of low-calorie dense foods. The concept of mindful eating will be introduced as well as the importance of paying attention to internal hunger signals. Triggers for non-hunger eating and techniques for dealing with triggers will be explored. The purpose of this lesson is to provide patients with the opportunity to review the basic principles of the [facility]in Eating Plan, discuss the value of eating mindfully and how to measure internal cues of hunger and fullness using the Hunger Scale. Patients will also discuss reasons for non-hunger eating and learn strategies to use for controlling emotional eating.  Targeting Your Nutrition Priorities Clinical staff led group instruction and group discussion with PowerPoint presentation and patient guidebook. To enhance the learning environment the use of posters, models and videos may be added. Patients will learn how to determine their genetic susceptibility to disease by reviewing their family history. Patients will gain insight into the importance of diet as part of an overall healthy lifestyle in mitigating the impact of genetics and other environmental insults. The purpose of this lesson is to provide patients with the  opportunity to assess their personal nutrition priorities by looking at their family history, their own health history and current risk factors. Patients will also be able to discuss ways of prioritizing and modifying the [facility]in Eating Plan for their highest risk areas  Menu  Clinical staff led group instruction and group discussion with PowerPoint presentation and patient guidebook. To enhance the learning environment the use of posters, models and videos may be added. Using menus brought in from [employer], or printed from [employer], patients will apply the Pritikin dining out guidelines that were presented in the [employer] video. Patients will also be able to practice these guidelines in a variety of provided scenarios. The purpose of this lesson is to provide patients with the opportunity to practice hands-on learning of the [facility] Dining Out guidelines with actual menus and practice scenarios.  Label Reading Clinical staff led group instruction and group discussion with PowerPoint presentation and patient guidebook. To enhance the learning environment the use of posters, models and videos may be added. Patients will review and discuss the [facility] label reading guidelines presented in [facility]'s Label Reading Educational series video. Using fool labels  brought in from local grocery stores and markets, patients will apply the label reading guidelines and determine if the packaged food meet the [facility]ritikin guidelines. The purpose of this lesson is to provide patients with the opportunity to review, discuss, and practice hands-on learning of the [facility] Label Reading guidelines with actual packaged food labels. Cooking School  [facility]'s [employer] are designed to teach patients ways to prepare quick, simple, and affordable recipes at home. The importance of nutrition's role in chronic disease risk reduction is reflected in its emphasis in the overall  [facility]in program. By learning how to prepare essential core P[facility]ikin Eating Plan recipes, patients will increase control over what they eat; be able to customize the flavor of foods without the use of added salt, sugar, or fat; and improve the quality of the food they consume. By learning a set of core recipes which are easily assembled, quickly prepared, and affordable, patients are more likely to prepare more healthy foods at home. These workshops focus on convenient breakfasts, simple entres, side dishes, and desserts which can be prepared with minimal effort and are consistent with nutrition recommendations for cardiovascular risk reduction. Cooking [employer] are taught by a Armed forces logistics/support/administrative officer (RD) who has been trained by the [employer]. The chef or RD has a clear understanding of the importance of minimizing - if not completely eliminating - added fat, sugar, and sodium in recipes. Throughout the series of Cooking School Workshop sessions, patients will learn about healthy ingredients and efficient methods of cooking to build confidence in their capability to prepare    Cooking School weekly topics:  Adding Flavor- Sodium-Free  Fast and Healthy Breakfasts  Powerhouse Plant-Based Proteins  Satisfying Salads and Dressings  Simple Sides and Sauces  International Cuisine-Spotlight on the [employer] Zones  Delicious Desserts  Savory Soups  Efficiency Cooking - Meals in a Snap  Tasty Appetizers and Snacks  Comforting Weekend Breakfasts  One-Pot Wonders   Fast Evening Meals  Easy Entertaining  Personalizing Your Pritikin Plate  WORKSHOPS   Healthy Mindset (Psychosocial): New Thoughts, New Behaviors Clinical staff led group instruction and group discussion with PowerPoint presentation and patient guidebook. To enhance the learning environment the use of posters, models and videos may be added. Patients will learn and practice techniques for developing effective health  and lifestyle goals. Patients will be able to effectively apply the goal setting process learned to develop at least one new personal goal.  The purpose of this lesson is to expose patients to a new skill set of behavior modification techniques such as techniques setting SMART goals, overcoming barriers, and achieving new thoughts and new behaviors.  Managing Moods and Relationships Clinical staff led group instruction and group discussion with PowerPoint presentation and patient guidebook. To enhance the learning environment the use of posters, models and videos may be added. Patients will learn how emotional and chronic stress factors can impact their health and relationships. They will learn healthy ways to manage their moods and utilize positive coping mechanisms. In addition, ICR patients will learn ways to improve communication skills. The purpose of this lesson is to expose patients to ways of understanding how one's mood and health are intimately connected. Developing a healthy outlook can help build positive relationships and connections with others. Patients will understand the importance of utilizing effective communication skills that include actively listening and being heard. They will learn and understand the importance of the "4 Cs" and especially Connections in fostering of a  Healthy Mind-Set.  Healthy Sleep for a Healthy Heart Clinical staff led group instruction and group discussion with PowerPoint presentation and patient guidebook. To enhance the learning environment the use of posters, models and videos may be added. At the conclusion of this workshop, patients will be able to demonstrate knowledge of the importance of sleep to overall health, well-being, and quality of life. They will understand the symptoms of, and treatments for, common sleep disorders. Patients will also be able to identify daytime and nighttime behaviors which impact sleep, and they will be able to apply these tools  to help manage sleep-related challenges. The purpose of this lesson is to provide patients with a general overview of sleep and outline the importance of quality sleep. Patients will learn about a few of the most common sleep disorders. Patients will also be introduced to the concept of "sleep hygiene," and discover ways to self-manage certain sleeping problems through simple daily behavior changes. Finally, the workshop will motivate patients by clarifying the links between quality sleep and their goals of heart-healthy living.   Recognizing and Reducing Stress Clinical staff led group instruction and group discussion with PowerPoint presentation and patient guidebook. To enhance the learning environment the use of posters, models and videos may be added. At the conclusion of this workshop, patients will be able to understand the types of stress reactions, differentiate between acute and chronic stress, and recognize the impact that chronic stress has on their health. They will also be able to apply different coping mechanisms, such as reframing negative self-talk. Patients will have the opportunity to practice a variety of stress management techniques, such as deep abdominal breathing, progressive muscle relaxation, and/or guided imagery.  The purpose of this lesson is to educate patients on the role of stress in their lives and to provide healthy techniques for coping with it.  Learning Barriers/Preferences:  Learning Barriers/Preferences - 11/07/21 1326       Learning Barriers/Preferences   Learning Barriers None    Learning Preferences Written Material;Verbal Instruction             Education Topics:  Knowledge Questionnaire Score:  Knowledge Questionnaire Score - 11/07/21 1133       Knowledge Questionnaire Score   Pre Score 21/28             Core Components/Risk Factors/Patient Goals at Admission:  Personal Goals and Risk Factors at Admission - 11/07/21 1133       Core  Components/Risk Factors/Patient Goals on Admission    Weight Management Yes;Obesity    Intervention Weight Management/Obesity: Establish reasonable short term and long term weight goals.;Obesity: Provide education and appropriate resources to help participant work on and attain dietary goals.    Admit Weight 230 lb 6.1 oz (104.5 kg)    Expected Outcomes Short Term: Continue to assess and modify interventions until short term weight is achieved;Long Term: Adherence to nutrition and physical activity/exercise program aimed toward attainment of established weight goal;Weight Loss: Understanding of general recommendations for a balanced deficit meal plan, which promotes 1-2 lb weight loss per week and includes a negative energy balance of [phone] kcal/d    Tobacco Cessation Yes    Number of packs per day 0.25    Intervention Assist the participant in steps to quit. Provide individualized education and counseling about committing to Tobacco Cessation, relapse prevention, and pharmacological support that can be provided by physician.;Education officer, environmental, assist with locating and accessing local/national Quit Smoking programs, and support quit date choice.  Expected Outcomes Short Term: Will demonstrate readiness to quit, by selecting a quit date.;Long Term: Complete abstinence from all tobacco products for at least 12 months from quit date.;Short Term: Will quit all tobacco product use, adhering to prevention of relapse plan.    Diabetes Yes    Intervention Provide education about signs/symptoms and action to take for hypo/hyperglycemia.;Provide education about proper nutrition, including hydration, and aerobic/resistive exercise prescription along with prescribed medications to achieve blood glucose in normal ranges: Fasting glucose 65-99 mg/dL    Expected Outcomes Short Term: Participant verbalizes understanding of the signs/symptoms and immediate care of hyper/hypoglycemia, proper foot care and  importance of medication, aerobic/resistive exercise and nutrition plan for blood glucose control.;Long Term: Attainment of HbA1C < 7%.    Heart Failure Yes    Intervention Provide a combined exercise and nutrition program that is supplemented with education, support and counseling about heart failure. Directed toward relieving symptoms such as shortness of breath, decreased exercise tolerance, and extremity edema.    Expected Outcomes Improve functional capacity of life;Short term: Attendance in program 2-3 days a week with increased exercise capacity. Reported lower sodium intake. Reported increased fruit and vegetable intake. Reports medication compliance.;Short term: Daily weights obtained and reported for increase. Utilizing diuretic protocols set by physician.;Long term: Adoption of self-care skills and reduction of barriers for early signs and symptoms recognition and intervention leading to self-care maintenance.    Hypertension Yes    Intervention Provide education on lifestyle modifcations including regular physical activity/exercise, weight management, moderate sodium restriction and increased consumption of fresh fruit, vegetables, and low fat dairy, alcohol moderation, and smoking cessation.;Monitor prescription use compliance.    Expected Outcomes Short Term: Continued assessment and intervention until BP is < 140/70mm HG in hypertensive participants. < 130/32mm HG in hypertensive participants with diabetes, heart failure or chronic kidney disease.;Long Term: Maintenance of blood pressure at goal levels.    Lipids Yes    Intervention Provide education and support for participant on nutrition & aerobic/resistive exercise along with prescribed medications to achieve LDL 70mg , HDL >40mg .    Expected Outcomes Short Term: Participant states understanding of desired cholesterol values and is compliant with medications prescribed. Participant is following exercise prescription and nutrition  guidelines.;Long Term: Cholesterol controlled with medications as prescribed, with individualized exercise RX and with personalized nutrition plan. Value goals: LDL < 70mg , HDL > 40 mg.    Personal Goal Other Yes    Personal Goal Be able to get back to gardening, yardwork, and housework.    Intervention Provide individualized exercise prescription including aerobic, resistance, and stretching exercises that patient can do to help increase strength and stamina, so that he can do ADLs and activites he enjoys such as gardening.    Expected Outcomes Patient will have strength and stamina to perform ADLs including gardening, yardwork, and housework.             Core Components/Risk Factors/Patient Goals Review:   Goals and Risk Factor Review     Row Name 11/13/21 1432 11/28/21 1428           Core Components/Risk Factors/Patient Goals Review   Personal Goals Review Weight Management/Obesity;Stress;Hypertension;Lipids;Diabetes;Heart Failure Weight Management/Obesity;Stress;Hypertension;Lipids;Diabetes;Heart Failure      Review Telvin started intensive cardiac rehab. Blood pressure hard to hear with manual BP cuff. Automatic BP. CBG's 233 pre 210 post. Patient continues to smoke and does not have any intension of stopping at this time. Asymptomatic. Patient is deconditoned and wear a prosthesis. Jon has attended 3  exercise sessions so far. Vital signs and CBG's have been variable. Smoking cessation has been encouraged. Tyran says he has cut back. Gust is currently out with cold symptoms. Will continue to monitor      Expected Outcomes Keilon will continue to participate in intensive cardiac rehab for exercise, nutrition and lifestyle modifications Case will continue to participate in intensive cardiac rehab for exercise, nutrition and lifestyle modifications               Core Components/Risk Factors/Patient Goals at Discharge (Final Review):   Goals and Risk Factor Review - 11/28/21 1428       Core  Components/Risk Factors/Patient Goals Review   Personal Goals Review Weight Management/Obesity;Stress;Hypertension;Lipids;Diabetes;Heart Failure    Review Jeorge has attended 3 exercise sessions so far. Vital signs and CBG's have been variable. Smoking cessation has been encouraged. Alyx says he has cut back. Bron is currently out with cold symptoms. Will continue to monitor    Expected Outcomes Deaglan will continue to participate in intensive cardiac rehab for exercise, nutrition and lifestyle modifications             ITP Comments:  ITP Comments     Row Name 11/07/21 1040 11/13/21 1420 11/28/21 1424       ITP Comments Dr. Armanda Magic, Medical Director, Introduction to [facility] eduacation intensive cardiac rehab. Initial [facility] orientation packet reviewed with patient. 30 Day ITP Review. Cillian started intensive cardiac rehab and did well with exercise for his fitness level as the patient is somewhat deconditioned. 30 Day ITP Review. Gearold has good participation in intensive cardiac rehab when in attendance. Damone has only attended 3 exercise sessions so far. Attendance has been poor. Basem is out currently with cold symptoms.              Comments: See ITP comments.Thayer Headings RN BSN

## 2021-11-29 ENCOUNTER — Ambulatory Visit (HOSPITAL_COMMUNITY): Payer: Medicaid Other

## 2021-11-29 ENCOUNTER — Encounter (HOSPITAL_COMMUNITY): Payer: No Typology Code available for payment source

## 2021-11-29 NOTE — Progress Notes (Signed)
Cardiology Office Note:    Date:  12/06/2021   ID:  Ricardo Howard, DOB 1960-11-13, MRN [id number]  PCP:  [facility], [facility]  [facility] Cardiologist:  Charlton Haws, MD  [facility] Electrophysiologist:  None   Chief Complaint: medication management   History of Present Illness:    Ricardo Howard is a 61 y.o. male with a hx of CAD, hypertension, hyperlipidemia, diabetes, peripheral vascular disease s/p right BKA, obstructive sleep apnea on CPAP, chronic combined CHF, alcohol and tobacco use seen for close follow-up.  Admitted 06/2021 for NSTEMI. Cath showed High-grade calcified proximal right coronary artery lesion treated with atherectomy and 1 drug-eluting stent with patent previously placed mid to distal right coronary artery stent and proximal left circumflex stent, the latter with moderate in-stent restenosis. Placed on DAPT with Brilinta/ASA indefinitely give the length of stent and number. Does have moderate disease in the pLAD and LCx to be treated medically.  LVEF of 45-50% with septal hypokinesis mid/basal wall akinesis.  He missed post hospital follow-up appointment. Has issues with med compliance and refills   Brillinta changed to Plavix for dyspnea   [facility] poorly [facility]   Past Medical History:  Diagnosis Date   Alcohol abuse 06/14/2019   Anxiety    Back pain    Cataracts, bilateral    CHF (congestive heart failure) (HCC)    Depression    Diabetic retinopathy (HCC)    Essential hypertension 06/14/2019   Hepatitis C    cured   Hx of BKA, right (HCC)    Hyperlipidemia    Hypertension    Mixed hyperlipidemia due to type 2 diabetes mellitus (HCC) 07/31/2019   Nicotine dependence, cigarettes, uncomplicated 07/31/2019   PAD (peripheral artery disease) (HCC)    Pancreatitis    PTSD (post-traumatic stress disorder)    Uncontrolled type 2 diabetes mellitus with diabetic polyneuropathy, with long-term current use of insulin  07/31/2019    Past Surgical History:  Procedure Laterality Date   ABDOMINAL AORTOGRAM W/LOWER EXTREMITY N/A 12/02/2020   Procedure: ABDOMINAL AORTOGRAM W/LOWER EXTREMITY;  Surgeon: Leonie Douglas, MD;  [facility]: [facility] IN[facility];  Service: Cardiovascular;  Laterality: N/A;   AMPUTATION Right 03/13/2020   Procedure: TRANSMETATARSAL AMPUTATION;  Surgeon: Chuck Hint, MD;  Location[facility];  Service: Vascular;  Laterality: Right;   AMPUTATION Right 03/15/2020   Procedure: RIGHT BELOW KNEE AMPUTATION;  Surgeon: Chuck Hint, MD;  Location: [facility] OR;  Service: Vascular;  Laterality: Right;   CORONARY ANGIOPLASTY WITH STENT PLACEMENT     CORONARY STENT INTERVENTION N/A 07/16/2021   Procedure: CORONARY STENT INTERVENTION;  Surgeon: Orbie Pyo, MD;  Location: [facility] CV [facility];  Service: Cardiovascular;  Laterality: N/A;   LEFT HEART CATH AND CORONARY ANGIOGRAPHY N/A 07/16/2021   Procedure: LEFT HEART CATH AND CORONARY ANGIOGRAPHY;  Surgeon: Orbie Pyo, MD;  Location: [facility]VAS[facility] CV [facility];  Service: Cardiovascular;  Laterality: N/A;   PERIPHERAL VASCULAR INTERVENTION  12/02/2020   Procedure: PERIPHERAL VASCULAR INTERVENTION;  Surgeon: Leonie Douglas, MD;  Location: [facility];  Service: Cardiovascular;;  Lt SFA, LEIA    Current Medications: Current Meds  Medication Sig   acetaminophen (TYLENOL) 500 MG tablet Take 1,000 mg by mouth 2 (two) times daily as needed for mild pain.   aspirin EC 81 MG tablet Take 81 mg by mouth in the morning.   atorvastatin (LIPITOR) 80 MG tablet Take 1 tablet (80 mg total) by  mouth at bedtime.   buPROPion (WELLBUTRIN SR) 150 MG 12 hr tablet Take 150 mg by mouth in the morning and at bedtime.   carvedilol (COREG) 6.25 MG tablet Take 1 tablet (6.25 mg total) by mouth 2 (two) times daily with a meal.   clopidogrel (PLAVIX) 75 MG tablet Take 75 mg by mouth in the morning.   empagliflozin (JARDIANCE) 25 MG TABS tablet Take 1 tablet (25  mg total) by mouth daily.   ezetimibe (ZETIA) 10 MG tablet Take 1 tablet (10 mg total) by mouth daily.   folic acid (FOLVITE) 1 MG tablet Take 1 tablet (1 mg total) by mouth daily.   gabapentin (NEURONTIN) 300 MG capsule Take 300-600 mg by mouth See admin instructions. Take 1 capsule (300 mg) by mouth in the morning, take 1 capsule (300 mg) by mouth at lunch, & take 2 capsules (600 mg) by mouth at bedtime   insulin aspart (NOVOLOG) 100 UNIT/ML injection Inject 10 Units into the skin 3 (three) times daily with meals. (Patient taking differently: Inject 10-20 Units into the skin 3 (three) times daily with meals. Sliding Scale Insulin)   insulin glargine (LANTUS) 100 UNIT/ML Solostar Pen 30units in am and 35unit at night  Further adjustment per your pcp and endocrinology Goal of a1c is less than 7%, currently your a1c is 10%, not at goal (Patient taking differently: 30-35 Units See admin instructions. 30units in am and 35unit at night  Further adjustment per your pcp and endocrinology Goal of a1c is less than 7%, currently your a1c is 10%, not at goal)   Insulin Pen Needle (NOVOFINE) 30G X 8 MM MISC Inject 10 each into the skin as needed.   isosorbide mononitrate (IMDUR) 60 MG 24 hr tablet Take 30 mg by mouth in the morning.   lipase/protease/amylase (CREON) 36000 UNITS CPEP capsule Take 1 capsule (36,000 Units total) by mouth 3 (three) times daily before meals.   lurasidone (LATUDA) 40 MG TABS tablet Take 40 mg by mouth every evening.   magnesium oxide (MAG-OX) 400 (241.3 Mg) MG tablet Take 1 tablet (400 mg total) by mouth 2 (two) times daily.   mesalamine (LIALDA) 1.2 g EC tablet Take 1.2 g by mouth in the morning.   metoprolol succinate (TOPROL-XL) 100 MG 24 hr tablet Take 0.5 mg by mouth daily at 6 (six) AM.   naloxone (NARCAN) nasal spray 4 mg/0.1 mL Place 1 spray into the nose as needed (opioid reversal).   nitroGLYCERIN (NITROSTAT) 0.4 MG SL tablet Place 1 tablet (0.4 mg total) under the tongue  every 5 (five) minutes as needed for chest pain.   pantoprazole (PROTONIX) 40 MG tablet Take 40 mg by mouth in the morning.   polyethylene glycol (MIRALAX / GLYCOLAX) 17 g packet Take 17 g by mouth daily as needed for mild constipation.   potassium chloride SA (KLOR-CON M) 20 MEQ tablet Take 1 tablet (20 mEq total) by mouth daily.   pregabalin (LYRICA) 150 MG capsule Take 150 mg by mouth 2 (two) times daily.   ranolazine (RANEXA) 500 MG 12 hr tablet Take 1 tablet by mouth daily at 6 (six) AM.   sacubitril-valsartan (ENTRESTO) 24-26 MG Take 1 tablet by mouth 2 (two) times daily.   sertraline (ZOLOFT) 100 MG tablet Take 100 mg by mouth daily.   tamsulosin (FLOMAX) 0.4 MG CAPS capsule Take 0.4 mg by mouth daily.   thiamine 100 MG tablet Take 1 tablet (100 mg total) by mouth daily.   vitamin B-12 (  CYANOCOBALAMIN) 500 MCG tablet Take 500 mcg by mouth in the morning.   [DISCONTINUED] sertraline (ZOLOFT) 100 MG tablet Take 150 mg by mouth daily at 6 (six) AM.     Allergies:   Iohexol, Ozempic (0.25 or 0.5 mg-dose) [semaglutide(0.25 or 0.5mg -dos)], Shellfish allergy, Milk (cow), Zestril [lisinopril], and Zocor [simvastatin]   Social History   Socioeconomic History   Marital status: Married    Spouse name: Not on file   Number of children: Not on file   Years of education: Not on file   Highest education level: Not on file  Occupational History   Occupation: Retired  Tobacco Use   Smoking status: Every Day    Packs/day: 0.25    Types: Cigarettes   Smokeless tobacco: Never  Vaping Use   Vaping Use: Never used  Substance and Sexual Activity   Alcohol use: Not Currently    Comment: etoh abuse   Drug use: Not Currently   Sexual activity: Not on file  Other Topics Concern   Not on file  Social History Narrative   retired   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    ROS:   Please see the history of present illness.    All other systems reviewed and are negative.   EKGs/Labs/Other Studies Reviewed:    The following studies were reviewed today:  Echo 07/16/21:   1. Septal hypokinesis mid/basal wall akinesis . Left ventricular ejection  fraction, by estimation, is 45 to 50%. The left ventricle has mildly  decreased function. The left ventricle demonstrates regional wall motion  abnormalities (see scoring  diagram/findings for description). The left ventricular internal cavity  size was mildly dilated. There is mild left ventricular hypertrophy.   2. Right ventricular systolic function is normal. The right ventricular  size is normal.   3. The mitral valve is normal in structure. No evidence of mitral valve  regurgitation.   4. The aortic valve is tricuspid. There is moderate calcification of the  aortic valve. There is mild thickening of the aortic valve. Aortic valve  sclerosis/calcification is present, without any evidence of aortic  stenosis.    Cardiac cath 07/16/21:      Mid RCA lesion is 20% stenosed.   Prox RCA lesion is 95% stenosed.   Prox Cx lesion is 40% stenosed.   Mid LAD lesion is 50% stenosed.   A stent was successfully placed.   Post intervention, there is a 0% residual stenosis.   LV end diastolic pressure is mildly elevated.   1.  High-grade calcified proximal right coronary artery lesion treated with atherectomy and 1 drug-eluting stent with patent previously placed mid to distal right coronary artery stent and proximal left circumflex stent, the latter with moderate in-stent restenosis. 2.  Very focal mid moderate LAD lesion. 3.  LVEDP of 19 mmHg.   Recommendation: Dual antiplatelet therapy for 1 year.   Dominance: Right Intervention         EKG:  EKG is  ordered today.  The  ekg ordered today demonstrates sinus tachycardia  Recent Labs: 07/16/2021: ALT 42; B Natriuretic Peptide 56.4; TSH 0.580 07/18/2021: Hemoglobin 13.5; Magnesium 2.0; Platelets 116 09/07/2021: BUN 17; Creatinine, Ser 0.88; Potassium 4.5;  Sodium 141  Recent Lipid Panel    Component Value Date/Time   CHOL 249 (H) 07/16/2021 1448   TRIG 58 07/16/2021 1448   HDL 44 07/16/2021 1448   CHOLHDL 5.7 07/16/2021 1448   VLDL 12 07/16/2021 1448   LDLCALC 193 (H) 07/16/2021 1448     Physical Exam:    VS:  BP (!) 82/58   Pulse 82   Ht 5\' 9"  (1.753 m)   Wt 227 lb (103 kg)   SpO2 98%   BMI 33.52 kg/m    Repeat BP 100/63 mm Hg   Wt Readings from Last 3 Encounters:  12/06/21 227 lb (103 kg)  11/07/21 230 lb 6.1 oz (104.5 kg)  09/07/21 231 lb 3.2 oz (104.9 kg)     GEN:  Well nourished, well developed in no acute distress HEENT: Normal NECK: No JVD; No carotid bruits LYMPHATICS: No lymphadenopathy CARDIAC: Regular tachycardic, no murmurs, rubs, gallops RESPIRATORY: Diminished breath sounds with rales  ABDOMEN: Soft, non-tender, + distended MUSCULOSKELETAL: Right BKA, 1+ lower extremity edema on left side  SKIN: Warm and dry NEUROLOGIC:  Alert and oriented x 3 PSYCHIATRIC:  Normal affect   ASSESSMENT AND PLAN:    CAD - cath 06/2021 High-grade calcified proximal right coronary artery lesion treated with atherectomy and 1 drug-eluting stent with patent previously placed mid to distal right coronary artery stent and proximal left circumflex stent, the latter with moderate in-stent restenosis -Continue aspirin -He will continue Plav[facility] which [facility] (stop Brilinta sample) -Continue Lipitor and Zetia - Continue Imdur 30 mg daily -Continue carvedilol 6.25 mg daily - Note contrast allergy for future reference  2. ICM/acute on chronic combined heart failure - Echo with LVEF of 45-50% with septal hypokinesis mid/basal wall akinesis - Recent volume overload from compliance/diet  issues Lasix   -Continue carvedilol 6.25 mg twice daily -Continue to hold losartan -Continue Jardiance -Added potassium supplement with increase Lasix -Close follow-up next week with repeat blood work -He was noncompliant with salt intake.  At length discussion regarding salt and water management.  3. HTN -Blood pressure soft -Continue to hold losartan and lisinopril  -Continue reduced dose of carvedilol at 6.25 mg twice daily -Continue Imdur 30 mg daily  4. HLD -Continue Zetia and epidural  5. PAD s/p RBKA  6. OSA on CPAP  7. Tobacco/Alcohol use  8.  Sinus tachycardia improved on lopressor   9.  DM:  major problem will refer to local endocrine for better control as [facility] Dr Renne Crigler with [facility]   Medication Adjustments/Labs and Tests Ordered: Current medicines are reviewed at length with the patient today.  Concerns regarding medicines are outlined above.  Orders Placed This Encounter  Procedures   Ambulatory referral to Endocrinology   No orders of the defined types were placed in this encounter.   Patient Instructions  Medication Instructions:  Your physician recommends that you continue on your current medications as directed. Please refer to the Current Medication list given to you today.  *If you need a refill on your cardiac medications before your next appointment, please call your pharmacy*  Lab Work: If you have labs (blood work) drawn today and your tests are completely normal, you will receive your results only by: [place] (if you have MyChart) OR A paper copy in the mail If you have any lab test that is abnormal or we need to change your treatment, we will call you to review the results.  Testing/Procedures: None ordered  today.  Follow-Up: At [facility], you and your health needs are our priority.  As part of our continuing mission to provide you with exceptional heart care, we have created designated Provider Care  Teams.  These Care Teams include your primary Cardiologist (physician) and Advanced Practice Providers (APPs -  Physician Assistants and Nurse Practitioners) who all work together to provide you with the care you need, when you need it.  We recommend signing up for the patient portal called "MyChart".  Sign up information is provided on this After Visit Summary.  MyChart is used to connect with patients for Virtual Visits (Telemedicine).  Patients are able to view lab/test results, encounter notes, upcoming appointments, etc.  Non-urgent messages can be sent to your provider as well.   To learn more about what you can do with MyChart, go to [email or web address].    Your next appointment:   6 month(s)  The format for your next appointment:   In Person  Provider:   Charlton Haws, MD     Other Instructions You have been referred to Endocrinology. Their office will call you to schedule an appointment.    Important Information About Sugar         Signed, Charlton Haws, MD  12/06/2021 9:43 AM    [facility]

## 2021-12-01 ENCOUNTER — Ambulatory Visit (HOSPITAL_COMMUNITY): Payer: Medicaid Other

## 2021-12-01 ENCOUNTER — Encounter (HOSPITAL_COMMUNITY): Payer: No Typology Code available for payment source

## 2021-12-04 ENCOUNTER — Ambulatory Visit (HOSPITAL_COMMUNITY): Payer: Medicaid Other

## 2021-12-04 ENCOUNTER — Encounter (HOSPITAL_COMMUNITY)
Admission: RE | Admit: 2021-12-04 | Discharge: 2021-12-04 | Disposition: A | Discharge status: Home / Self Care | Payer: No Typology Code available for payment source | Source: Ambulatory Visit | Attending: Cardiovascular Disease | Admitting: Cardiovascular Disease

## 2021-12-04 ENCOUNTER — Telehealth (HOSPITAL_COMMUNITY): Payer: Self-pay | Admitting: *Deleted

## 2021-12-04 VITALS — BP 96/67 | HR 95 | Wt 228.2 lb

## 2021-12-04 DIAGNOSIS — Z955 Presence of coronary angioplasty implant and graft: Secondary | ICD-10-CM | POA: Diagnosis not present

## 2021-12-04 DIAGNOSIS — I214 Non-ST elevation (NSTEMI) myocardial infarction: Secondary | ICD-10-CM

## 2021-12-04 NOTE — Telephone Encounter (Signed)
[name] says that Ricardo Howard is feeling better and has no further symptoms. Kartier plans to return to exercise. [name] says that Ricardo Howard has recovered from his cold.Harrell Gave RN BSN

## 2021-12-06 ENCOUNTER — Ambulatory Visit (HOSPITAL_COMMUNITY): Payer: Medicaid Other

## 2021-12-06 ENCOUNTER — Encounter (INDEPENDENT_AMBULATORY_CARE_PROVIDER_SITE_OTHER): Payer: No Typology Code available for payment source | Admitting: Cardiovascular Disease

## 2021-12-06 ENCOUNTER — Encounter (HOSPITAL_COMMUNITY)
Admission: RE | Admit: 2021-12-06 | Discharge: 2021-12-06 | Disposition: A | Discharge status: Home / Self Care | Payer: No Typology Code available for payment source | Source: Ambulatory Visit | Attending: Cardiovascular Disease | Admitting: Cardiovascular Disease

## 2021-12-06 ENCOUNTER — Encounter: Payer: Self-pay | Admitting: Cardiovascular Disease

## 2021-12-06 VITALS — BP 82/58 | HR 82 | Ht 69.0 in | Wt 227.0 lb

## 2021-12-06 DIAGNOSIS — E782 Mixed hyperlipidemia: Secondary | ICD-10-CM

## 2021-12-06 DIAGNOSIS — I5020 Unspecified systolic (congestive) heart failure: Secondary | ICD-10-CM | POA: Diagnosis not present

## 2021-12-06 DIAGNOSIS — Z955 Presence of coronary angioplasty implant and graft: Secondary | ICD-10-CM

## 2021-12-06 DIAGNOSIS — I214 Non-ST elevation (NSTEMI) myocardial infarction: Secondary | ICD-10-CM

## 2021-12-06 DIAGNOSIS — R000 Tachycardia, unspecified: Secondary | ICD-10-CM | POA: Diagnosis not present

## 2021-12-06 DIAGNOSIS — E1169 Type 2 diabetes mellitus with other specified complication: Secondary | ICD-10-CM

## 2021-12-06 DIAGNOSIS — R7309 Other abnormal glucose: Secondary | ICD-10-CM | POA: Diagnosis not present

## 2021-12-06 NOTE — Patient Instructions (Addendum)
Medication Instructions:  Your physician recommends that you continue on your current medications as directed. Please refer to the Current Medication list given to you today.  *If you need a refill on your cardiac medications before your next appointment, please call your pharmacy*  Lab Work: If you have labs (blood work) drawn today and your tests are completely normal, you will receive your results only by: [place] (if you have MyChart) OR A paper copy in the mail If you have any lab test that is abnormal or we need to change your treatment, we will call you to review the results.  Testing/Procedures: None ordered today.  Follow-Up: At [facility], you and your health needs are our priority.  As part of our continuing mission to provide you with exceptional heart care, we have created designated Provider Care Teams.  These Care Teams include your primary Cardiologist (physician) and Advanced Practice Providers (APPs -  Physician Assistants and Nurse Practitioners) who all work together to provide you with the care you need, when you need it.  We recommend signing up for the patient portal called "MyChart".  Sign up information is provided on this After Visit Summary.  MyChart is used to connect with patients for Virtual Visits (Telemedicine).  Patients are able to view lab/test results, encounter notes, upcoming appointments, etc.  Non-urgent messages can be sent to your provider as well.   To learn more about what you can do with MyChart, go to [email or web address].    Your next appointment:   6 month(s)  The format for your next appointment:   In Person  Provider:   Jenkins Rouge, MD     Other Instructions You have been referred to Endocrinology. Their office will call you to schedule an appointment.    Important Information About Sugar

## 2021-12-06 NOTE — Progress Notes (Signed)
Incomplete Session Note  Patient Details  Name: Ricardo Howard MRN: [id number] Date of Birth: 04/30/1960 Referring Provider:   Flowsheet Row INTENSIVE CARDIAC REHAB ORIENT from 11/07/2021 in [place]  Referring Provider Josue Hector, MD       Ricardo Howard did not complete his rehab session.  Ricardo Howard reported having lower back and right hip pain and that he has just the seen the cardiologist and had a low blood pressure. CBG 237. I advised Ricardo Howard not to exercise if he does not feel good. Patient hopes to return to exercise on Friday if he feels better.Ricardo Gave RN BSN

## 2021-12-08 ENCOUNTER — Ambulatory Visit (HOSPITAL_COMMUNITY): Payer: Medicaid Other

## 2021-12-08 ENCOUNTER — Encounter (HOSPITAL_COMMUNITY): Payer: No Typology Code available for payment source

## 2021-12-11 ENCOUNTER — Ambulatory Visit (HOSPITAL_COMMUNITY): Payer: Medicaid Other

## 2021-12-11 ENCOUNTER — Encounter (HOSPITAL_COMMUNITY): Payer: No Typology Code available for payment source

## 2021-12-13 ENCOUNTER — Ambulatory Visit (HOSPITAL_COMMUNITY): Payer: Medicaid Other

## 2021-12-13 ENCOUNTER — Encounter (HOSPITAL_COMMUNITY): Payer: No Typology Code available for payment source

## 2021-12-15 ENCOUNTER — Ambulatory Visit (HOSPITAL_COMMUNITY): Payer: Medicaid Other

## 2021-12-15 ENCOUNTER — Encounter (HOSPITAL_COMMUNITY): Payer: No Typology Code available for payment source

## 2021-12-18 ENCOUNTER — Encounter (HOSPITAL_COMMUNITY): Payer: No Typology Code available for payment source

## 2021-12-20 ENCOUNTER — Encounter (HOSPITAL_COMMUNITY): Payer: No Typology Code available for payment source

## 2021-12-22 ENCOUNTER — Encounter (HOSPITAL_COMMUNITY): Payer: No Typology Code available for payment source

## 2021-12-25 ENCOUNTER — Encounter (HOSPITAL_COMMUNITY): Payer: No Typology Code available for payment source

## 2021-12-27 ENCOUNTER — Telehealth (HOSPITAL_COMMUNITY): Payer: Self-pay | Admitting: *Deleted

## 2021-12-27 ENCOUNTER — Encounter (HOSPITAL_COMMUNITY): Payer: No Typology Code available for payment source

## 2021-12-27 NOTE — Telephone Encounter (Signed)
Spoke with [name], [name] has decided not to return to exercise. [name] says that [name] has been breathing hard and has been wearing oxygen. I asked [name] is Gerell's physician's have been notified. [name] says that they have not. I asked [name] to please notify [name] physician about the patient's current status. [name] said that she will call the [name]. Ricardo Howard. Will discharge from intensive cardiac rehab at this time.[name], RN,BSN 12/27/2021 3:13 PM

## 2021-12-29 ENCOUNTER — Encounter (HOSPITAL_COMMUNITY): Payer: No Typology Code available for payment source

## 2022-01-01 ENCOUNTER — Encounter (HOSPITAL_COMMUNITY): Payer: No Typology Code available for payment source

## 2022-01-03 ENCOUNTER — Encounter (HOSPITAL_COMMUNITY): Payer: No Typology Code available for payment source

## 2022-01-05 ENCOUNTER — Encounter (HOSPITAL_COMMUNITY): Payer: No Typology Code available for payment source

## 2022-01-30 IMAGING — MR MR ABDOMEN WO/W CM MRCP
20 of 32 series · 27 of 48 positions shown · IV contrast (gadavist)
Comparison: CT scan 06/14/2019

CLINICAL DATA: Pancreatitis.

EXAM:
MRI ABDOMEN WITHOUT AND WITH CONTRAST (INCLUDING MRCP)
TECHNIQUE: Multiplanar multisequence MR imaging of the abdomen was performed
both before and after the administration of intravenous contrast.
Heavily T2-weighted images of the biliary and pancreatic ducts were
obtained, and three-dimensional MRCP images were rendered by post
processing.
CONTRAST:  8mL GADAVIST GADOBUTROL 1 MMOL/ML IV SOLN

[Series 3: T2 fat-sat · axial · 6.0mm · 1.25mm/px · 1 of 42 slices shown (1 of 2)]
[im 1/42]
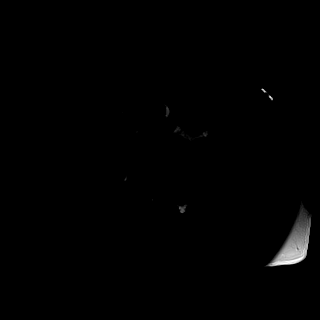

[Series 3: T2 fat-sat · axial · 6.0mm · 1.25mm/px · 1 of 42 slices shown (2 of 2)]
[im 1/42]
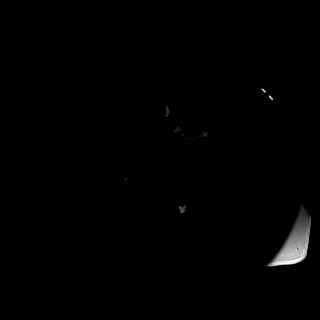

[Series 6: bSSFP · coronal · 6.0mm · 0.74mm/px · 1 of 45 slices shown (1 of 2)]
[im 1/45]
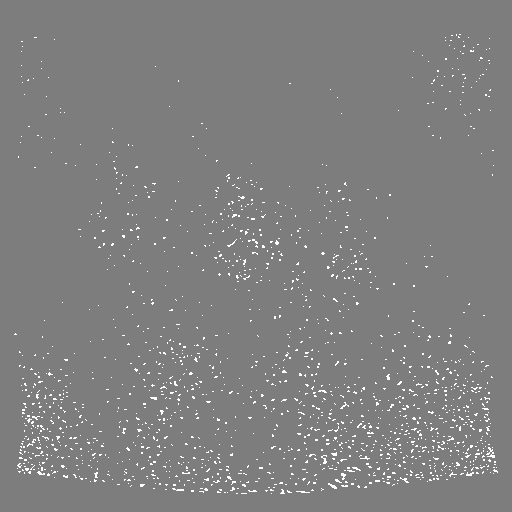

[Series 6: bSSFP · coronal · 6.0mm · 0.74mm/px · 1 of 45 slices shown (2 of 2)]
[im 1/45]
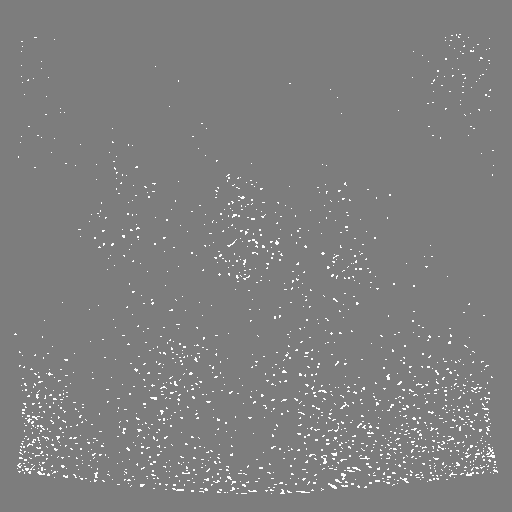

[Series 7: DWI · axial · 6.0mm · 1.49mm/px · 1 of 82 slices shown (1 of 4)]
[im 1/82]
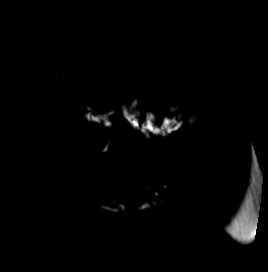

[Series 7: DWI · axial · 6.0mm · 1.49mm/px · 1 of 82 slices shown (2 of 4)]
[im 1/82]
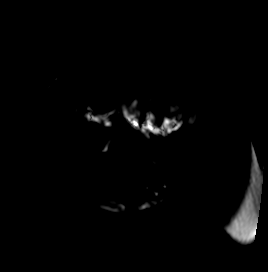

[Series 8: DWI · axial · 6.0mm · 1.49mm/px · 1 of 41 slices shown (3 of 4)]
[im 1/41]
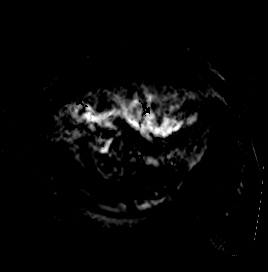

[Series 8: DWI · axial · 6.0mm · 1.49mm/px · 1 of 41 slices shown (4 of 4)]
[im 1/41]
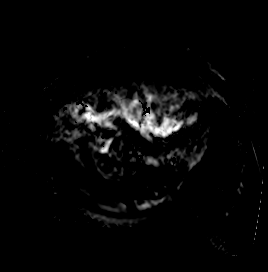

[Series 9: T1 · axial · 3.0mm · 1.25mm/px · z∈[-147,+90]mm · 2 of 80 slices shown (1 of 4)]
[im 1/80]
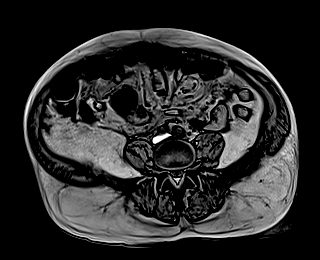
[im 80/80]
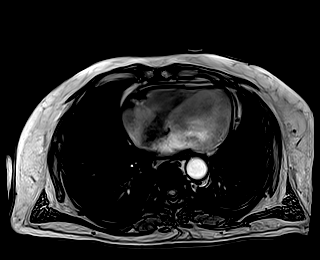

[Series 9: T1 · axial · 3.0mm · 1.25mm/px · z∈[-147,+90]mm · 2 of 80 slices shown (2 of 4)]
[im 1/80]
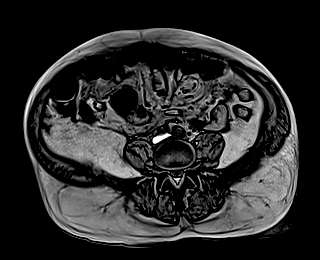
[im 80/80]
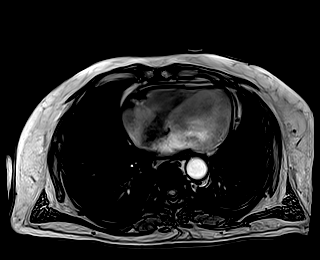

[Series 10: T1 · axial · 3.0mm · 1.25mm/px · z∈[-147,+90]mm · 2 of 80 slices shown (3 of 4)]
[im 1/80]
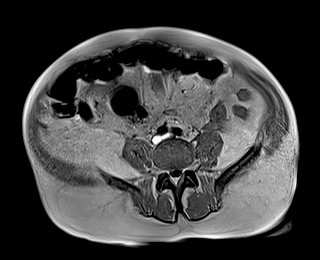
[im 80/80]
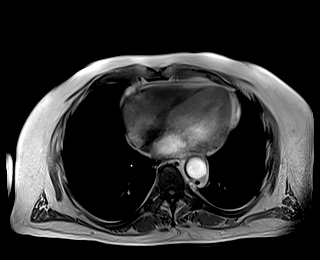

[Series 10: T1 · axial · 3.0mm · 1.25mm/px · z∈[-147,+90]mm · 2 of 80 slices shown (4 of 4)]
[im 1/80]
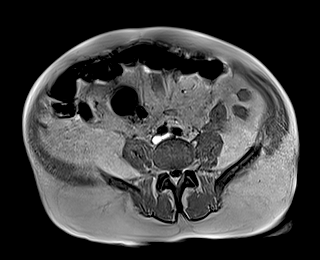
[im 80/80]
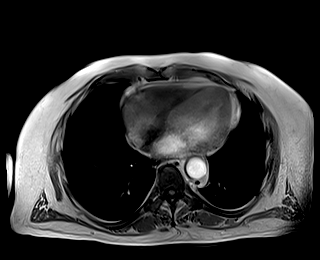

[Series 11: cor obl thk · sagittal · 50.0mm · 0.78mm/px · 1 of 9 slices shown (1 of 2)]
[im 1/9]
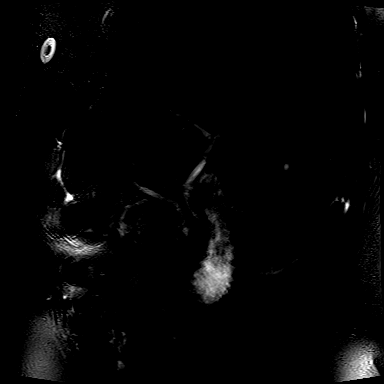

[Series 11: cor obl thk · sagittal · 50.0mm · 0.78mm/px · 1 of 9 slices shown (2 of 2)]
[im 1/9]
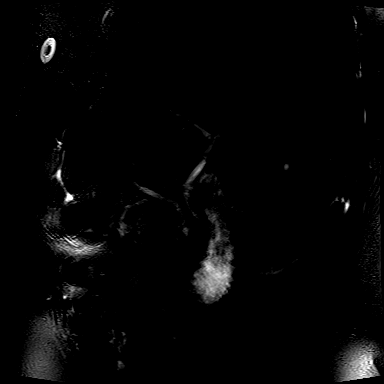

[Series 13: cor_3d_spc_trig · coronal · 1.0mm · 0.49mm/px · 2 of 88 slices shown (1 of 2)]
[im 1/88]
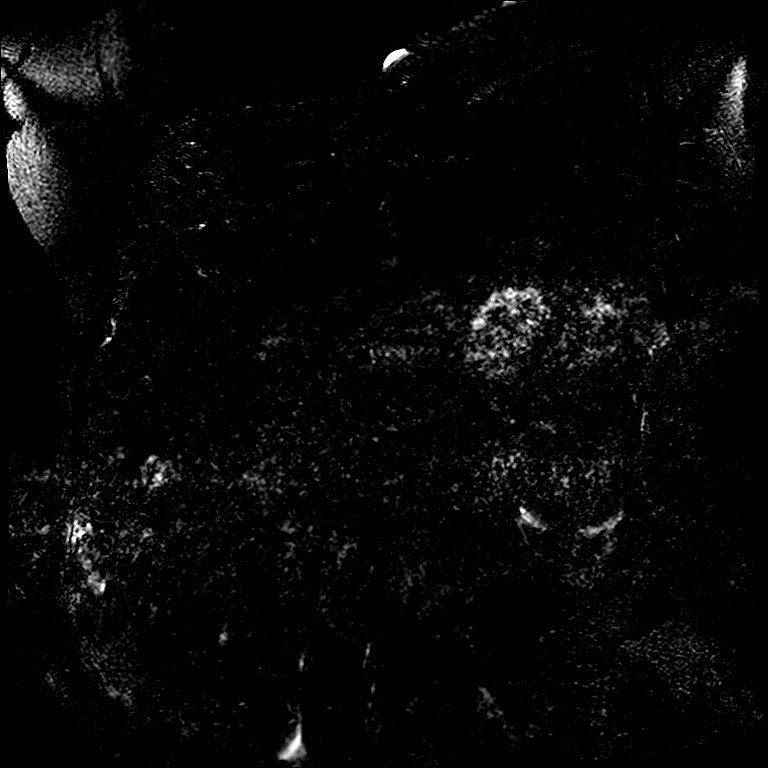
[im 88/88]
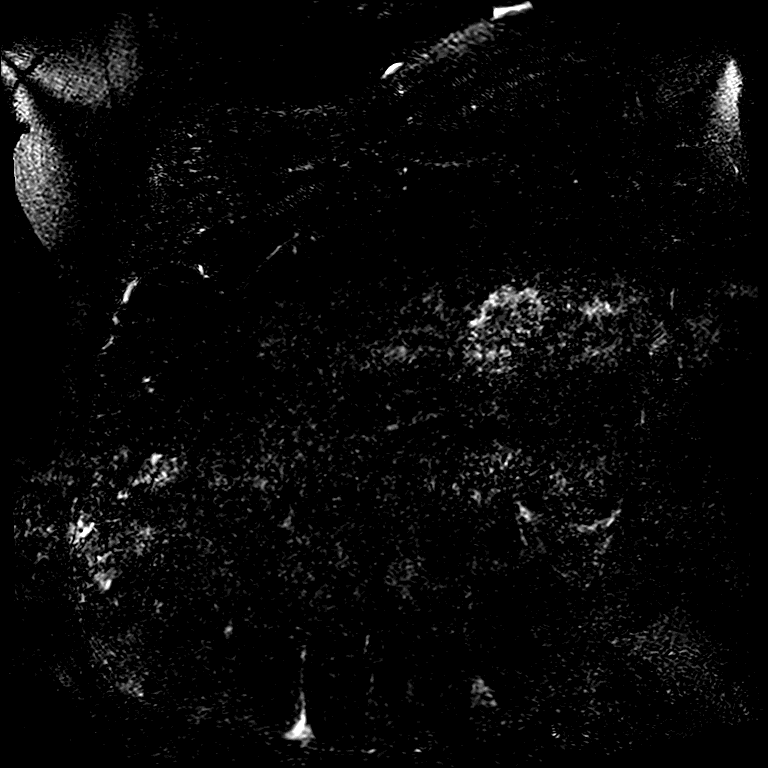

[Series 13: cor_3d_spc_trig · coronal · 1.0mm · 0.49mm/px · 2 of 88 slices shown (2 of 2)]
[im 1/88]
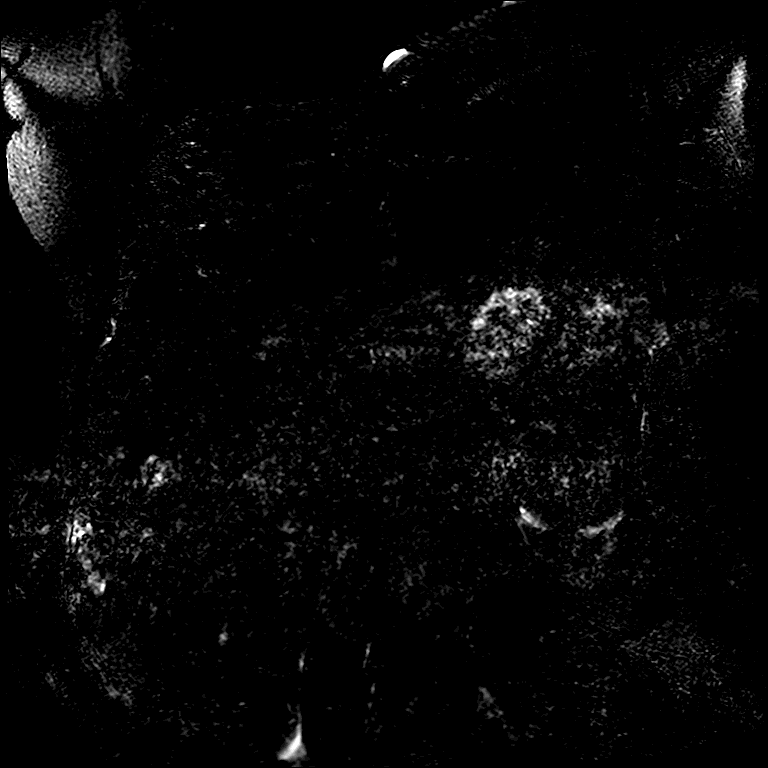
[im 88/88]
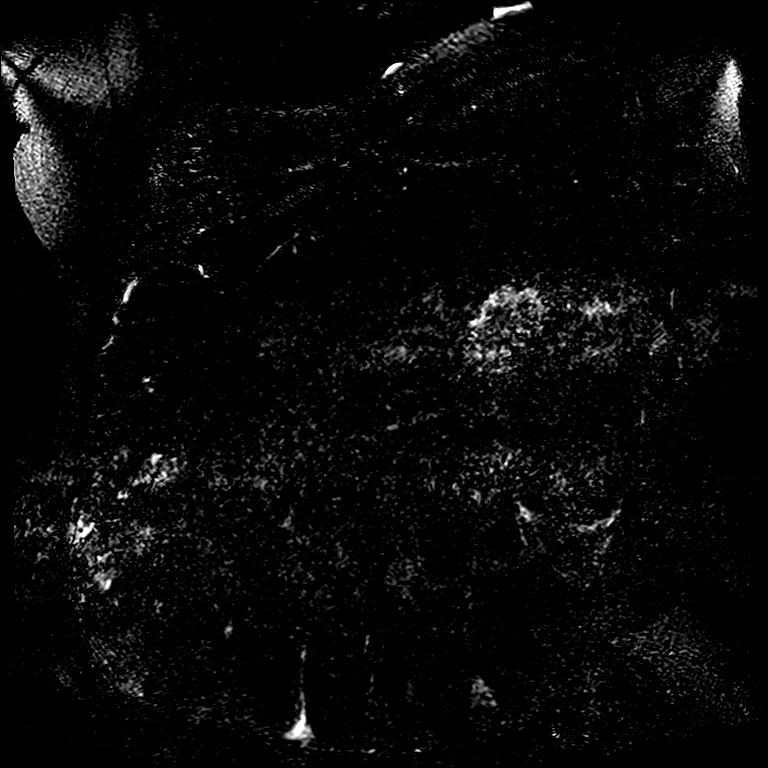

[Series 15: T2 · axial · 6.0mm · 1.56mm/px · 1 of 37 slices shown (1 of 2)]
[im 1/37]
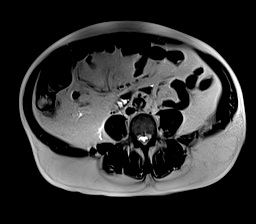

[Series 15: T2 · axial · 6.0mm · 1.56mm/px · 1 of 37 slices shown (2 of 2)]
[im 1/37]
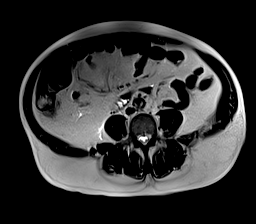

[Series 17: T1 dynamic · axial · 3.0mm · 1.25mm/px · z∈[-132,+129]mm · 2 of 88 slices shown (1 of 2)]
[im 1/88]
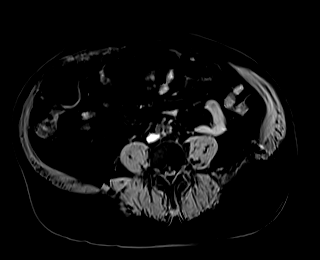
[im 88/88]
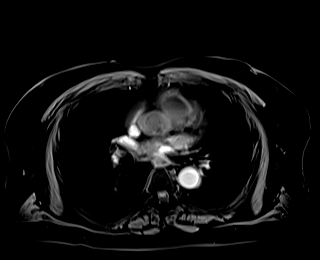

[Series 17: T1 dynamic · axial · 3.0mm · 1.25mm/px · 1 of 88 slices shown (2 of 2)]
[im 1/88]
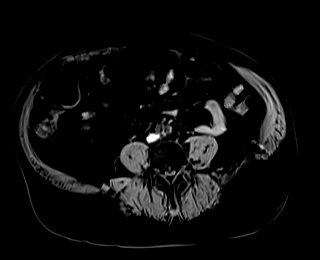

[27 of 48 positions shown; findings below may reference images not displayed]

FINDINGS: Lower chest: The lung bases are clear of an acute process. No
pleural or pericardial effusion.

Hepatobiliary: Geographic fatty infiltration of the liver but no
focal hepatic lesions or intrahepatic biliary dilatation. The
gallbladder is mildly contracted. No definite gallstones or findings
for acute cholecystitis. Normal caliber and course of the common
bile duct. Low insertion of the cystic duct.

Pancreas: Mild diffuse pancreatic inflammation most notably in the
body and tail region consistent with known acute pancreatitis. No
complicating features such as pancreatic necrosis or pseudocyst.
There is some fluid in the anterior pararenal space on the left side
along with some fluid in the left pericolic gutter.

Mild main pancreatic ductal dilatation in the midbody region and
head down to a sizable calculus likely in the duct in the pancreatic
head. This was also noticed on the recent CT scan and it measured
approximately 7 mm. Maximum duct diameter is 7.5 mm.

This calculus was also present on a prior CT scan from 1414 but at
that time measured only 3.5 mm and ductal dilatation was not as
obvious.

Spleen: Normal size. No focal lesions. Small amount of perisplenic
fluid.

Adrenals/Urinary Tract: The adrenal glands and kidneys are
unremarkable.

Stomach/Bowel: The stomach, duodenum, visualized small bowel and
colon are grossly normal. There is some inflammatory change
involving the descending colon near the splenic flexure related to
the pancreatitis. Mild inflammation of the third portion of the
duodenum is also noted.

Vascular/Lymphatic: The aorta and branch vessels are patent. The
major venous structures are patent. The splenic vein is patent.

Other:  None

Musculoskeletal: No significant bony findings.
IMPRESSION: 1. Mild diffuse pancreatic inflammation consistent with known acute
pancreatitis. No complicating features such as pancreatic necrosis
or pseudocyst.
2. No gallstones or common bile duct stones. Normal caliber and
course of the common bile duct.
3. Mild main pancreatic ductal dilatation in the midbody region and
head down to a 7 mm calculus in the pancreatic head. This was also
present on a prior CT scan from 1414 but at that time it measured
3.5 mm.
4. Mild inflammation involving the descending colon and the third
portion of the duodenum.
5. Fatty infiltration of the liver but no focal hepatic lesions or
intrahepatic biliary dilatation.

## 2022-03-07 ENCOUNTER — Encounter (HOSPITAL_COMMUNITY): Payer: No Typology Code available for payment source

## 2022-03-13 ENCOUNTER — Ambulatory Visit (HOSPITAL_COMMUNITY): Payer: Medicaid Other

## 2022-03-19 ENCOUNTER — Ambulatory Visit (HOSPITAL_COMMUNITY): Payer: No Typology Code available for payment source

## 2022-03-20 ENCOUNTER — Ambulatory Visit: Payer: No Typology Code available for payment source | Admitting: Vascular Surgery

## 2022-04-17 ENCOUNTER — Ambulatory Visit (INDEPENDENT_AMBULATORY_CARE_PROVIDER_SITE_OTHER)
Admission: RE | Admit: 2022-04-17 | Discharge: 2022-04-17 | Disposition: A | Discharge status: Home / Self Care | Payer: No Typology Code available for payment source | Source: Ambulatory Visit | Attending: Vascular Surgery | Admitting: Vascular Surgery

## 2022-04-17 ENCOUNTER — Ambulatory Visit (INDEPENDENT_AMBULATORY_CARE_PROVIDER_SITE_OTHER): Payer: No Typology Code available for payment source | Admitting: Vascular Surgery

## 2022-04-17 ENCOUNTER — Encounter: Payer: Self-pay | Admitting: Vascular Surgery

## 2022-04-17 ENCOUNTER — Ambulatory Visit (HOSPITAL_COMMUNITY)
Admission: RE | Admit: 2022-04-17 | Discharge: 2022-04-17 | Disposition: A | Discharge status: Home / Self Care | Payer: No Typology Code available for payment source | Source: Ambulatory Visit | Attending: Vascular Surgery | Admitting: Vascular Surgery

## 2022-04-17 VITALS — BP 110/76 | HR 102 | Temp 98.2°F | Resp 20 | Ht 69.0 in | Wt 217.0 lb

## 2022-04-17 DIAGNOSIS — I739 Peripheral vascular disease, unspecified: Secondary | ICD-10-CM

## 2022-04-17 NOTE — Progress Notes (Signed)
[facility] [place] PROGRESS NOTE  ASSESSMENT / PLAN: Ricardo Howard is a 62 y.o. male status post left superficial femoral / above knee popliteal artery stenting (7x158m Eluvia); left external iliac artery stenting (8x440mInnova) for left lower extremity chronic limb threatening ischemia.   Recommend the following:  Complete cessation from all tobacco products. Blood glucose control with goal A1c < 7%. Blood pressure control with goal blood pressure < 140/90 mmHg. Lipid reduction therapy with goal LDL-C <100 mg/dL (<70 if symptomatic from PAD).  Aspirin '81mg'$  PO QD.  Atorvastatin 40-'80mg'$  PO QD (or other "high intensity" statin therapy).  Follow-up with me in 12 months with left lower extremity duplex and ABI  SUBJECTIVE: Overall doing great. Walking without difficulty. Completely healed.  OBJECTIVE: BP 110/76 (BP Location: Left Arm, Patient Position: Sitting, Cuff Size: Large)   Pulse (!) 102   Temp 98.2 F (36.8 C)   Resp 20   Ht '5\' 9"'$  (1.753 m)   Wt 217 lb (98.4 kg)   SpO2 94%   BMI 32.05 kg/m   Constitutional: well appearing. no acute distress. Cardiac: RRR. Pulmonary: unlabored Abdomen: soft Vascular: L foot healed.     Latest Ref Rng & Units 07/18/2021    5:55 AM 07/17/2021    1:12 AM 07/16/2021    2:31 PM  CBC  WBC 4.0 - 10.5 K/uL 8.5  8.9    Hemoglobin 13.0 - 17.0 g/dL 13.5  15.0  17.0   Hematocrit 39.0 - 52.0 % 40.7  45.9  50.0   Platelets 150 - 400 K/uL 116  PLATELET CLUMPS NOTED ON SMEAR, UNABLE TO ESTIMATE          Latest Ref Rng & Units 09/07/2021    9:00 AM 08/30/2021    4:47 PM 07/18/2021    5:55 AM  CMP  Glucose 70 - 99 mg/dL 165  199  123   BUN 8 - 27 mg/dL '17  16  14   '$ Creatinine 0.76 - 1.27 mg/dL 0.88  0.78  0.85   Sodium 134 - 144 mmol/L 141  136  137   Potassium 3.5 - 5.2 mmol/L 4.5  4.5  3.3   Chloride 96 - 106 mmol/L 102  102  104   CO2 20 - 29 mmol/L '21  16  26   '$ Calcium 8.6 - 10.2 mg/dL 9.7  9.0  8.8       +-------+-----------+-----------+------------+------------+  ABI/TBIToday's ABIToday's TBIPrevious ABIPrevious TBI  +-------+-----------+-----------+------------+------------+  Right BKA                                             +-------+-----------+-----------+------------+------------+  Left  1.87       0.71       1.04        0.76          +-------+-----------+-----------+------------+------------+    Left: The left SFA / popliteal stent appears patent with no visualized  stenosis. The left external artery stent appears patent.   NOTE: This was a technically difficut exam due to depth of stent and  calcifications throughout the left lower extremity.   Ricardo AlineHaStanford BreedMD [facility] Number: ([id number] 2:39 PM

## 2022-04-18 LAB — VAS US ABI WITH/WO TBI: Left ABI: 1.87

## 2022-06-04 DIAGNOSIS — I959 Hypotension, unspecified: Secondary | ICD-10-CM | POA: Diagnosis not present

## 2022-06-19 ENCOUNTER — Emergency Department (HOSPITAL_COMMUNITY): Payer: No Typology Code available for payment source

## 2022-06-19 ENCOUNTER — Encounter (HOSPITAL_COMMUNITY): Payer: Self-pay

## 2022-06-19 ENCOUNTER — Other Ambulatory Visit: Payer: Self-pay

## 2022-06-19 ENCOUNTER — Observation Stay (HOSPITAL_COMMUNITY)
Admission: EM | Admit: 2022-06-19 | Discharge: 2022-06-20 | Disposition: A | Discharge status: Home / Self Care | Payer: No Typology Code available for payment source | Attending: Internal Medicine | Admitting: Internal Medicine

## 2022-06-19 DIAGNOSIS — Z7902 Long term (current) use of antithrombotics/antiplatelets: Secondary | ICD-10-CM | POA: Diagnosis not present

## 2022-06-19 DIAGNOSIS — I5042 Chronic combined systolic (congestive) and diastolic (congestive) heart failure: Secondary | ICD-10-CM | POA: Diagnosis present

## 2022-06-19 DIAGNOSIS — I110 Hypertensive heart disease with heart failure: Secondary | ICD-10-CM | POA: Diagnosis not present

## 2022-06-19 DIAGNOSIS — I2510 Atherosclerotic heart disease of native coronary artery without angina pectoris: Secondary | ICD-10-CM | POA: Diagnosis not present

## 2022-06-19 DIAGNOSIS — J449 Chronic obstructive pulmonary disease, unspecified: Secondary | ICD-10-CM | POA: Insufficient documentation

## 2022-06-19 DIAGNOSIS — M542 Cervicalgia: Secondary | ICD-10-CM | POA: Diagnosis not present

## 2022-06-19 DIAGNOSIS — R55 Syncope and collapse: Secondary | ICD-10-CM

## 2022-06-19 DIAGNOSIS — W19XXXA Unspecified fall, initial encounter: Secondary | ICD-10-CM

## 2022-06-19 DIAGNOSIS — E782 Mixed hyperlipidemia: Secondary | ICD-10-CM | POA: Diagnosis not present

## 2022-06-19 DIAGNOSIS — Z89511 Acquired absence of right leg below knee: Secondary | ICD-10-CM | POA: Insufficient documentation

## 2022-06-19 DIAGNOSIS — Z6834 Body mass index (BMI) 34.0-34.9, adult: Secondary | ICD-10-CM | POA: Diagnosis not present

## 2022-06-19 DIAGNOSIS — Z7982 Long term (current) use of aspirin: Secondary | ICD-10-CM | POA: Insufficient documentation

## 2022-06-19 DIAGNOSIS — Z720 Tobacco use: Secondary | ICD-10-CM | POA: Diagnosis present

## 2022-06-19 DIAGNOSIS — E1140 Type 2 diabetes mellitus with diabetic neuropathy, unspecified: Secondary | ICD-10-CM | POA: Diagnosis not present

## 2022-06-19 DIAGNOSIS — E1169 Type 2 diabetes mellitus with other specified complication: Secondary | ICD-10-CM | POA: Diagnosis present

## 2022-06-19 DIAGNOSIS — Z79899 Other long term (current) drug therapy: Secondary | ICD-10-CM | POA: Insufficient documentation

## 2022-06-19 DIAGNOSIS — F1010 Alcohol abuse, uncomplicated: Secondary | ICD-10-CM | POA: Diagnosis present

## 2022-06-19 DIAGNOSIS — I10 Essential (primary) hypertension: Secondary | ICD-10-CM | POA: Diagnosis present

## 2022-06-19 DIAGNOSIS — W133XXA Fall through floor, initial encounter: Secondary | ICD-10-CM | POA: Diagnosis not present

## 2022-06-19 DIAGNOSIS — I959 Hypotension, unspecified: Secondary | ICD-10-CM

## 2022-06-19 DIAGNOSIS — F17210 Nicotine dependence, cigarettes, uncomplicated: Secondary | ICD-10-CM | POA: Diagnosis not present

## 2022-06-19 DIAGNOSIS — Z955 Presence of coronary angioplasty implant and graft: Secondary | ICD-10-CM | POA: Diagnosis not present

## 2022-06-19 DIAGNOSIS — E871 Hypo-osmolality and hyponatremia: Secondary | ICD-10-CM | POA: Diagnosis present

## 2022-06-19 DIAGNOSIS — R42 Dizziness and giddiness: Secondary | ICD-10-CM

## 2022-06-19 DIAGNOSIS — E872 Acidosis, unspecified: Secondary | ICD-10-CM | POA: Diagnosis not present

## 2022-06-19 DIAGNOSIS — G4489 Other headache syndrome: Secondary | ICD-10-CM | POA: Diagnosis not present

## 2022-06-19 DIAGNOSIS — E669 Obesity, unspecified: Secondary | ICD-10-CM | POA: Insufficient documentation

## 2022-06-19 DIAGNOSIS — Z794 Long term (current) use of insulin: Secondary | ICD-10-CM | POA: Diagnosis not present

## 2022-06-19 DIAGNOSIS — Z89519 Acquired absence of unspecified leg below knee: Secondary | ICD-10-CM

## 2022-06-19 LAB — URINALYSIS, ROUTINE W REFLEX MICROSCOPIC
Bilirubin Urine: NEGATIVE
Glucose, UA: >=500 mg/dL — AB
Hgb urine dipstick: NEGATIVE
Ketones, ur: NEGATIVE mg/dL
Leukocytes,Ua: NEGATIVE
Nitrite: NEGATIVE
Protein, ur: NEGATIVE mg/dL
Specific Gravity, Urine: 1.007 (ref 1.005–1.030)
pH: 5 (ref 5.0–8.0)

## 2022-06-19 LAB — COMPREHENSIVE METABOLIC PANEL
ALT: 25 U/L (ref 0–44)
AST: 22 U/L (ref 15–41)
Albumin: 3.2 g/dL — ABNORMAL LOW (ref 3.5–5.0)
Alkaline Phosphatase: 81 U/L (ref 38–126)
Anion gap: 17 — ABNORMAL HIGH (ref 5–15)
BUN: 15 mg/dL (ref 8–23)
CO2: 14 mmol/L — ABNORMAL LOW (ref 22–32)
Calcium: 8.9 mg/dL (ref 8.9–10.3)
Chloride: 99 mmol/L (ref 98–111)
Creatinine, Ser: 1.07 mg/dL (ref 0.61–1.24)
GFR, Estimated: >60 mL/min (ref >60)
Glucose, Bld: 126 mg/dL — ABNORMAL HIGH (ref 70–99)
Potassium: 4 mmol/L (ref 3.5–5.1)
Sodium: 130 mmol/L — ABNORMAL LOW (ref 135–145)
Total Bilirubin: 0.8 mg/dL (ref 0.3–1.2)
Total Protein: 7 g/dL (ref 6.5–8.1)

## 2022-06-19 LAB — CBC
HCT: 43.9 % (ref 39.0–52.0)
Hemoglobin: 14.7 g/dL (ref 13.0–17.0)
MCH: 31.7 pg (ref 26.0–34.0)
MCHC: 33.5 g/dL (ref 30.0–36.0)
MCV: 94.8 fL (ref 80.0–100.0)
Platelets: 158 10*3/uL (ref 150–400)
RBC: 4.63 MIL/uL (ref 4.22–5.81)
RDW: 15.8 % — ABNORMAL HIGH (ref 11.5–15.5)
WBC: 6.4 10*3/uL (ref 4.0–10.5)
nRBC: 0 % (ref 0.0–0.2)

## 2022-06-19 LAB — PROTIME-INR
INR: 1.1 (ref 0.8–1.2)
Prothrombin Time: 14.5 seconds (ref 11.4–15.2)

## 2022-06-19 LAB — I-STAT CHEM 8, ED
BUN: 15 mg/dL (ref 8–23)
Calcium, Ion: 1.12 mmol/L — ABNORMAL LOW (ref 1.15–1.40)
Chloride: 100 mmol/L (ref 98–111)
Creatinine, Ser: 1 mg/dL (ref 0.61–1.24)
Glucose, Bld: 130 mg/dL — ABNORMAL HIGH (ref 70–99)
HCT: 48 % (ref 39.0–52.0)
Hemoglobin: 16.3 g/dL (ref 13.0–17.0)
Potassium: 3.7 mmol/L (ref 3.5–5.1)
Sodium: 131 mmol/L — ABNORMAL LOW (ref 135–145)
TCO2: 16 mmol/L — ABNORMAL LOW (ref 22–32)

## 2022-06-19 LAB — ETHANOL: Alcohol, Ethyl (B): 92 mg/dL — ABNORMAL HIGH (ref <10)

## 2022-06-19 LAB — LACTIC ACID, PLASMA: Lactic Acid, Venous: 4.6 mmol/L (ref 0.5–1.9)

## 2022-06-19 MED ORDER — LACTATED RINGERS IV BOLUS
2000.0000 mL | Freq: Once | INTRAVENOUS | Status: AC
  Administered 2022-06-19: 2000 mL via INTRAVENOUS

## 2022-06-19 MED ORDER — SODIUM CHLORIDE 0.9 % IV BOLUS
1000.0000 mL | Freq: Once | INTRAVENOUS | Status: AC
  Administered 2022-06-19: 1000 mL via INTRAVENOUS

## 2022-06-19 MED ORDER — KCL IN DEXTROSE-NACL 20-5-0.45 MEQ/L-%-% IV SOLN
Freq: Once | INTRAVENOUS | Status: AC
  Filled 2022-06-19: qty 1000

## 2022-06-19 MED ORDER — THIAMINE HCL 100 MG/ML IJ SOLN
100.0000 mg | Freq: Once | INTRAMUSCULAR | Status: AC
  Administered 2022-06-19: 100 mg via INTRAVENOUS
  Filled 2022-06-19: qty 2

## 2022-06-19 MED ORDER — INSULIN ASPART 100 UNIT/ML IJ SOLN
0.0000 [IU] | INTRAMUSCULAR | Status: DC
  Administered 2022-06-20: 1 [IU] via SUBCUTANEOUS

## 2022-06-19 NOTE — H&P (Signed)
Ricardo Howard WUJ:[id number] DOB: [birth year] DOA: 06/19/2022     PCP: [facility]   Outpatient Specialists:  CARDS:  Dr. Charlton Haws, MD   Patient arrived to ER on 06/19/22 at 1718 Referred by Attending Glyn Ade, MD   Patient coming from:    home Live With family    Chief Complaint:   Chief Complaint  Patient presents with   Fall    HPI: Ricardo Howard is a 62 y.o. male with medical history significant of  CAD, DM2, CHF, alcohol abuse, tobacco abuse, chronic hypoxic respiratory failure 3 L, PTSD, peripheral vascular disease s/p right BKA, obstructive sleep apnea on CPAP,     Presented with   a fall, poor historian still intoxicated Pt came in after a fall on Plavix, unwitnessed fall,  Pt has been feeling light headed  Hx  of etoh abuse and diabetes   On EMS arrival patient is hypotensive 98/62 then down to 88/58 IV fluids was given IV fluids administered Patient has chronic respiratory failure now at baseline on 3 L.  He drinks about 3 packs a day had alcohol today.  Patient fell down on the hardwood floor and hit his head under the bench.  Since his fall patient has been complain about neck pain   Admission a year ago in May 2023 with NSTEMI Has not been compliant He hs hd 6 beers today Reports no hx of DT or shakes  He reports just felt weak nd slid down reports he hs not eaten much yesterday      significant ETOH intake  6 beers day   Smoke 5 cig  day  Lab Results  Component Value Date   SARSCOV2NAA NEGATIVE 05/30/2021   SARSCOV2NAA NEGATIVE 07/18/2020   SARSCOV2NAA NEGATIVE 03/18/2020   SARSCOV2NAA NEGATIVE 03/12/2020       Regarding pertinent Chronic problems:    Hyperlipidemia - on statins  Lipitor (atorvastatin) Zetia Lipid Panel     Component Value Date/Time   CHOL 249 (H) 07/16/2021 1448   TRIG 58 07/16/2021 1448   HDL 44 07/16/2021 1448   CHOLHDL 5.7 07/16/2021 1448   VLDL 12 07/16/2021 1448   LDLCALC 193 (H)  07/16/2021 1448     HTN on Coreg   chronic CHF diastolic/systolic/ combined -last EF 45-50 per cath On Coreg Jardiance patient not been compliant On Lasix Blood pressure has been soft therefore his lisinopril and losartan being held he does not tolerate this and his carvedilol had to be reduced in the past  last echo  Recent Results (from the past 95621 hour(s))  ECHOCARDIOGRAM COMPLETE   Collection Time: 05/31/21 12:18 PM  Result Value   Weight 4,240   Height 68   BP 145/89   S' Lateral 4.10   AR max vel 2.47   AV Area VTI 2.28   AV Mean grad 3.0   AV Peak grad 6.2   Ao pk vel 1.25   Area-P 1/2 4.31   AV Area mean vel 2.33   Narrative      ECHOCARDIOGRAM REPORT   IMPRESSIONS  1. Left ventricular ejection fraction, by estimation, is 40 to 45%. The left ventricle has mildly decreased function. Left ventricular endocardial border not optimally defined to evaluate regional wall motion. Left ventricular diastolic parameters are  consistent with Grade I diastolic dysfunction (impaired relaxation).  2. Right ventricular systolic function is normal. The right ventricular size is normal. There is normal pulmonary artery systolic pressure. The estimated  right ventricular systolic pressure is 31.5 mmHg.  3. The mitral valve is normal in structure. No evidence of mitral valve regurgitation. No evidence of mitral stenosis.  4. The aortic valve is tricuspid. Aortic valve regurgitation is not visualized. Aortic valve sclerosis/calcification is present, without any evidence of aortic stenosis. Aortic valve area, by VTI measures 2.28 cm. Aortic valve mean gradient measures  3.0 mmHg. Aortic valve Vmax measures 1.25 m/s.  5. The inferior vena cava is normal in size with greater than 50% respiratory variability, suggesting right atrial pressure of 3 mmHg.  6. Recommend limited study with definity contrast to assess for focal wall motion abnormalities due to poor acoustical windows          CAD  - On Aspirin, statin, betablocker, Plavix Imdur Has contrast allergy                - followed by cardiology                - last cardiac cath 5/23   .  High-grade calcified proximal right coronary artery lesion treated with atherectomy and 1 drug-eluting stent with patent previously placed mid to distal right coronary artery stent and proximal left circumflex stent, the latter with moderate in-stent restenosis. 2.  Very focal mid moderate LAD lesion. 3.  LVEDP of 19 mmHg.    DM 2 -  Lab Results  Component Value Date   HGBA1C 10.9 (H) 07/16/2021   on insulin,        obesity-   BMI Readings from Last 1 Encounters:  06/19/22 34.41 kg/m    COPD - not  followed by pulmonology  on baseline oxygen  3L,  prn    OSA -on nocturnal   CPAP     BPH - on Flomax,       While in ER: Clinical Course as of 06/19/22 2325  Tue Jun 19, 2022  1721 L2 trauma, fall, ETOH OB [CC]  1846 Anion gap(!): 17 [AH]  1848 DG Chest Port 1 View [AH]  1848 DG Pelvis Portable [AH]  1858 Lactic Acid, Venous(!!): 4.6 Patient with lactic acidosis.  He has been admitted for the same thing.  Suspect this is due to alcohol abuse.  Fluids ordered, thiamine ordered, half-normal saline with dextrose ordered. [AH]    Clinical Course User Index [AH] Arthor Captain, PA-C [CC] Glyn Ade, MD       Lab Orders         Comprehensive metabolic panel         CBC         Ethanol         Urinalysis, Routine w reflex microscopic -Urine, Clean Catch         Lactic acid, plasma         Protime-INR         I-Stat Chem 8, ED      CT HEAD   NON acute  CT spine no acute  Pelvis  No acute fracture or dislocation. 2. Mild-to-moderate degenerative changes of both hips.  CXR -  NON acute   Hepatic steatosis today MELD 12 patient continues to drink alcohol last CAT scan from 2023 showed possible chronic pancreatitis  Following Medications were ordered in ER: Medications  sodium chloride 0.9 % bolus 1,000 mL  (0 mLs Intravenous Stopped 06/19/22 1849)  thiamine (VITAMIN B1) injection 100 mg (100 mg Intravenous Given 06/19/22 2010)  dextrose 5 % and 0.45 % NaCl with KCl 20 mEq/L  infusion ( Intravenous New Bag/Given 06/19/22 2004)  lactated ringers bolus 2,000 mL (0 mLs Intravenous Stopped 06/19/22 2139)       ED Triage Vitals  Enc Vitals Group     BP 06/19/22 1721 90/62     Pulse Rate 06/19/22 1720 92     Resp 06/19/22 1720 (!) 21     Temp 06/19/22 1728 98.1 F (36.7 C)     Temp Source 06/19/22 1728 Oral     SpO2 06/19/22 1720 98 %     Weight 06/19/22 1722 233 lb (105.7 kg)     Height 06/19/22 1723 5\' 9"  (1.753 m)     Head Circumference --      Peak Flow --      Pain Score 06/19/22 1724 6     Pain Loc --      Pain Edu? --      Excl. in GC? --   TMAX(24)@     _________________________________________ Significant initial  Findings: Abnormal Labs Reviewed  COMPREHENSIVE METABOLIC PANEL - Abnormal; Notable for the following components:      Result Value   Sodium 130 (*)    CO2 14 (*)    Glucose, Bld 126 (*)    Albumin 3.2 (*)    Anion gap 17 (*)    All other components within normal limits  CBC - Abnormal; Notable for the following components:   RDW 15.8 (*)    All other components within normal limits  ETHANOL - Abnormal; Notable for the following components:   Alcohol, Ethyl (B) 92 (*)    All other components within normal limits  URINALYSIS, ROUTINE W REFLEX MICROSCOPIC - Abnormal; Notable for the following components:   Glucose, UA >=500 (*)    Bacteria, UA RARE (*)    All other components within normal limits  LACTIC ACID, PLASMA - Abnormal; Notable for the following components:   Lactic Acid, Venous 4.6 (*)    All other components within normal limits  I-STAT CHEM 8, ED - Abnormal; Notable for the following components:   Sodium 131 (*)    Glucose, Bld 130 (*)    Calcium, Ion 1.12 (*)    TCO2 16 (*)    All other components within normal limits     _________________________ Troponin  ordered Cardiac Panel (last 3 results)     ECG: Ordered Personally reviewed and interpreted by me showing: HR : 90 Rhythm:  NSR,     no evidence of ischemic changes QTC 456  BNP (last 3 results) Recent Labs    07/16/21 1037  BNP 56.4     The recent clinical data is shown below. Vitals:   06/19/22 2100 06/19/22 2200 06/19/22 2205 06/19/22 2215  BP: (!) 146/98 (!) 109/97  (!) 136/91  Pulse: 85 84  84  Resp: 20 20  19   Temp:   97.7 F (36.5 C)   TempSrc:   Oral   SpO2: 99% 98%  100%  Weight:      Height:        WBC     Component Value Date/Time   WBC 6.4 06/19/2022 1725   LYMPHSABS 0.9 07/16/2021 1037   MONOABS 1.0 07/16/2021 1037   EOSABS 0.0 07/16/2021 1037   BASOSABS 0.0 07/16/2021 1037    Lactic Acid, Venous    Component Value Date/Time   LATICACIDVEN 4.6 (HH) 06/19/2022 1725      Procalcitonin   Ordered      UA   no evidence  of UTI      Urine analysis:    Component Value Date/Time   COLORURINE YELLOW 06/19/2022 1730   APPEARANCEUR CLEAR 06/19/2022 1730   LABSPEC 1.007 06/19/2022 1730   PHURINE 5.0 06/19/2022 1730   GLUCOSEU >=500 (A) 06/19/2022 1730   HGBUR NEGATIVE 06/19/2022 1730   BILIRUBINUR NEGATIVE 06/19/2022 1730   KETONESUR NEGATIVE 06/19/2022 1730   PROTEINUR NEGATIVE 06/19/2022 1730   NITRITE NEGATIVE 06/19/2022 1730   LEUKOCYTESUR NEGATIVE 06/19/2022 1730    ____VBG pending   __________________________________________________________ Recent Labs  Lab 06/19/22 1725 06/19/22 1736 06/20/22 0047  NA 130* 131* 137  K 4.0 3.7 4.2  CO2 14*  --   --   GLUCOSE 126* 130*  --   BUN 15 15  --   CREATININE 1.07 1.00  --   CALCIUM 8.9  --   --     Cr   stable,    Lab Results  Component Value Date   CREATININE 1.00 06/19/2022   CREATININE 1.07 06/19/2022   CREATININE 0.88 09/07/2021    Recent Labs  Lab 06/19/22 1725  AST 22  ALT 25  ALKPHOS 81  BILITOT 0.8  PROT 7.0  ALBUMIN 3.2*    Lab Results  Component Value Date   CALCIUM 8.9 06/19/2022   PHOS 4.2 07/18/2020        Plt: Lab Results  Component Value Date   PLT 158 06/19/2022      Recent Labs  Lab 06/19/22 1725 06/19/22 1736  WBC 6.4  --   HGB 14.7 16.3  HCT 43.9 48.0  MCV 94.8  --   PLT 158  --     HG/HCT  stable,     Component Value Date/Time   HGB 16.3 06/19/2022 1736   HCT 48.0 06/19/2022 1736   MCV 94.8 06/19/2022 1725     _______________________________________________ Hospitalist was called for admission for   Lactic acidosis,     Hypotension, unspecified hypotension type    Fall, initial encounter    The following Work up has been ordered so far:  Orders Placed This Encounter  Procedures   DG Chest [employer] 1 View   DG Pelvis Portable   CT HEAD WO CONTRAST   CT CERVICAL SPINE WO CONTRAST   Comprehensive metabolic panel   CBC   Ethanol   Urinalysis, Routine w reflex microscopic -Urine, Clean Catch   Lactic acid, plasma   Protime-INR   Diet NPO time specified   ED Cardiac monitoring   Measure blood pressure   Initiate Carrier Fluid Protocol   Consult to hospitalist   I-Stat Chem 8, ED   EKG 12-Lead   Sample to Blood Bank     OTHER Significant initial  Findings:  labs showing:     DM  labs:  HbA1C: Recent Labs    07/16/21 1403  HGBA1C 10.9*       CBG (last 3)  No results for input(s): "GLUCAP" in the last 72 hours.        Cultures:    Component Value Date/Time   SDES BLOOD RIGHT HAND 05/30/2021 2359   SPECREQUEST  05/30/2021 2359    BOTTLES DRAWN AEROBIC AND ANAEROBIC Blood Culture adequate volume   CULT  05/30/2021 2359    NO GROWTH 5 DAYS Performed at [facility], [address], [place], [place] [address]    REPTSTATUS 06/05/2021 FINAL 05/30/2021 2359     Radiological Exams on Admission: DG Chest [facility] 1 View  Addendum  Date: 06/19/2022   ADDENDUM REPORT: 06/19/2022 23:20 FINDINGS: There is no evidence for focal lung infiltrate, pleural  effusion or pneumothorax. The cardiomediastinal silhouette is within normal limits. No acute fractures are seen. Strandy opacities in the lung bases are favored is atelectasis or scarring and unchanged from prior. IMPRESSION: No acute cardiopulmonary process. Electronically Signed   By: Darliss Cheney M.D.   On: 06/19/2022 23:20   Result Date: 06/19/2022 CLINICAL DATA:  Trauma EXAM: PORTABLE CHEST 1 VIEW COMPARISON:  Chest x-ray 06/04/2022 FINDINGS: The heart size and mediastinal contours are within normal limits. There some strandy opacities in both lung bases. Pleural effusion or pneumothorax. The visualized skeletal structures are unremarkable. IMPRESSION: Strandy opacities in both lung bases, likely atelectasis.  A Electronically Signed: By: Darliss Cheney M.D. On: 06/19/2022 17:52   CT HEAD WO CONTRAST  Result Date: 06/19/2022 CLINICAL DATA:  Head trauma, moderate-severe; Polytrauma, blunt. Fall. Dizziness. On Plavix. EXAM: CT HEAD WITHOUT CONTRAST CT CERVICAL SPINE WITHOUT CONTRAST TECHNIQUE: Multidetector CT imaging of the head and cervical spine was performed following the standard protocol without intravenous contrast. Multiplanar CT image reconstructions of the cervical spine were also generated. RADIATION DOSE REDUCTION: This exam was performed according to the departmental dose-optimization program which includes automated exposure control, adjustment of the mA and/or kV according to patient size and/or use of iterative reconstruction technique. COMPARISON:  Head CT 06/04/2022 FINDINGS: CT HEAD FINDINGS Brain: There is no evidence of an acute infarct, intracranial hemorrhage, mass, midline shift, or extra-axial fluid collection. Patchy hypodensities in the cerebral white matter bilaterally are unchanged and nonspecific but compatible with moderate chronic small vessel ischemic disease. Chronic lacunar infarcts are again seen in the right thalamus and left cerebellar hemisphere. The ventricles and  sulci are within normal limits for age. Vascular: Calcified atherosclerosis at the skull base. No hyperdense vessel. Skull: No acute fracture or suspicious osseous lesion. Sinuses/Orbits: Mild mucosal thickening in the paranasal sinuses. Clear mastoid air cells. Unremarkable orbits. Other: None. CT CERVICAL SPINE FINDINGS Alignment: Mild straightening of the normal cervical lordosis. Trace anterolisthesis of C4 on C5, likely degenerative. Skull base and vertebrae: No acute fracture or suspicious osseous lesion. Soft tissues and spinal canal: No prevertebral fluid or swelling. No visible canal hematoma. Disc levels: Mild multilevel disc degeneration. Severe facet arthrosis on the left at C3-4 and on the right at C4-5. Upper chest: No mass or consolidation in the included lung apices. Other: Mild calcific atherosclerosis at the carotid bifurcations. IMPRESSION: 1. No evidence of acute intracranial abnormality. 2. Moderate chronic small vessel ischemic disease. 3. No acute cervical spine fracture. Electronically Signed   By: Sebastian Ache M.D.   On: 06/19/2022 20:05   CT CERVICAL SPINE WO CONTRAST  Result Date: 06/19/2022 CLINICAL DATA:  Head trauma, moderate-severe; Polytrauma, blunt. Fall. Dizziness. On Plavix. EXAM: CT HEAD WITHOUT CONTRAST CT CERVICAL SPINE WITHOUT CONTRAST TECHNIQUE: Multidetector CT imaging of the head and cervical spine was performed following the standard protocol without intravenous contrast. Multiplanar CT image reconstructions of the cervical spine were also generated. RADIATION DOSE REDUCTION: This exam was performed according to the departmental dose-optimization program which includes automated exposure control, adjustment of the mA and/or kV according to patient size and/or use of iterative reconstruction technique. COMPARISON:  Head CT 06/04/2022 FINDINGS: CT HEAD FINDINGS Brain: There is no evidence of an acute infarct, intracranial hemorrhage, mass, midline shift, or extra-axial  fluid collection. Patchy hypodensities in the cerebral white matter bilaterally are unchanged and nonspecific but compatible with  moderate chronic small vessel ischemic disease. Chronic lacunar infarcts are again seen in the right thalamus and left cerebellar hemisphere. The ventricles and sulci are within normal limits for age. Vascular: Calcified atherosclerosis at the skull base. No hyperdense vessel. Skull: No acute fracture or suspicious osseous lesion. Sinuses/Orbits: Mild mucosal thickening in the paranasal sinuses. Clear mastoid air cells. Unremarkable orbits. Other: None. CT CERVICAL SPINE FINDINGS Alignment: Mild straightening of the normal cervical lordosis. Trace anterolisthesis of C4 on C5, likely degenerative. Skull base and vertebrae: No acute fracture or suspicious osseous lesion. Soft tissues and spinal canal: No prevertebral fluid or swelling. No visible canal hematoma. Disc levels: Mild multilevel disc degeneration. Severe facet arthrosis on the left at C3-4 and on the right at C4-5. Upper chest: No mass or consolidation in the included lung apices. Other: Mild calcific atherosclerosis at the carotid bifurcations. IMPRESSION: 1. No evidence of acute intracranial abnormality. 2. Moderate chronic small vessel ischemic disease. 3. No acute cervical spine fracture. Electronically Signed   By: Sebastian Ache M.D.   On: 06/19/2022 20:05   DG Pelvis Portable  Result Date: 06/19/2022 CLINICAL DATA:  Trauma EXAM: PORTABLE PELVIS 1-2 VIEWS COMPARISON:  None Available. FINDINGS: There is no evidence of pelvic fracture or diastasis. No pelvic bone lesions are seen. There are mild moderate degenerative changes of both hips. Peripheral vascular calcifications are present. IMPRESSION: 1. No acute fracture or dislocation. 2. Mild-to-moderate degenerative changes of both hips. Electronically Signed   By: Darliss Cheney M.D.   On: 06/19/2022 17:51    _______________________________________________________________________________________________________ Latest  Blood pressure (!) 136/91, pulse 84, temperature 97.7 F (36.5 C), temperature source Oral, resp. rate 19, height 5\' 9"  (1.753 m), weight 105.7 kg, SpO2 100 %.   Vitals  labs and radiology finding personally reviewed  Review of Systems:    Pertinent positives include:   fatigue  Constitutional:  No weight loss, night sweats, Fevers, chills,, weight loss  HEENT:  No headaches, Difficulty swallowing,Tooth/dental problems,Sore throat,  No sneezing, itching, ear ache, nasal congestion, post nasal drip,  Cardio-vascular:  No chest pain, Orthopnea, PND, anasarca, dizziness, palpitations.no Bilateral lower extremity swelling  GI:  No heartburn, indigestion, abdominal pain, nausea, vomiting, diarrhea, change in bowel habits, loss of appetite, melena, blood in stool, hematemesis Resp:  no shortness of breath at rest. No dyspnea on exertion, No excess mucus, no productive cough, No non-productive cough, No coughing up of blood.No change in color of mucus.No wheezing. Skin:  no rash or lesions. No jaundice GU:  no dysuria, change in color of urine, no urgency or frequency. No straining to urinate.  No flank pain.  Musculoskeletal:  No joint pain or no joint swelling. No decreased range of motion. No back pain.  Psych:  No change in mood or affect. No depression or anxiety. No memory loss.  Neuro: no localizing neurological complaints, no tingling, no weakness, no double vision, no gait abnormality, no slurred speech, no confusion  All systems reviewed and apart from HOPI all are negative _______________________________________________________________________________________________ Past Medical History:   Past Medical History:  Diagnosis Date   Alcohol abuse 06/14/2019   Anxiety    Back pain    Cataracts, bilateral    CHF (congestive heart failure) (HCC)    Depression     Diabetic retinopathy (HCC)    Essential hypertension 06/14/2019   Hepatitis C    cured   Hx of BKA, right (HCC)    Hyperlipidemia    Hypertension    Mixed hyperlipidemia  due to type 2 diabetes mellitus (HCC) 07/31/2019   Nicotine dependence, cigarettes, uncomplicated 07/31/2019   PAD (peripheral artery disease) (HCC)    Pancreatitis    PTSD (post-traumatic stress disorder)    Uncontrolled type 2 diabetes mellitus with diabetic polyneuropathy, with long-term current use of insulin 07/31/2019      Past Surgical History:  Procedure Laterality Date   ABDOMINAL AORTOGRAM W/LOWER EXTREMITY N/A 12/02/2020   Procedure: ABDOMINAL AORTOGRAM W/LOWER EXTREMITY;  Surgeon: Leonie Douglas, MD;  Location: [facility];  Service: Cardiovascular;  Laterality: N/A;   AMPUTATION Right 03/13/2020   Procedure: TRANSMETATARSAL AMPUTATION;  Surgeon: Chuck Hint, MD;  [facility];  Service: Vascular;  Laterality: Right;   AMPUTATION Right 03/15/2020   Procedure: RIGHT BELOW KNEE AMPUTATION;  Surgeon: Chuck Hint, MD;  Location[facility];  Service: Vascular;  Laterality: Right;   CORONARY ANGIOPLASTY WITH STENT PLACEMENT     CORONARY STENT INTERVENTION N/A 07/16/2021   Procedure: CORONARY STENT INTERVENTION;  Surgeon: Orbie Pyo, MD;  Location: [facility];  Service: Cardiovascular;  Laterality: N/A;   LEFT HEART CATH AND CORONARY ANGIOGRAPHY N/A 07/16/2021   Procedure: LEFT HEART CATH AND CORONARY ANGIOGRAPHY;  Surgeon: Orbie Pyo, MD;  Location: [facility];  Service: Cardiovascular;  Laterality: N/A;   PERIPHERAL VASCULAR INTERVENTION  12/02/2020   Procedure: PERIPHERAL VASCULAR INTERVENTION;  Surgeon: Leonie Douglas, MD;  Location: [facility];  Service: Cardiovascular;;  Lt SFA, LEIA    Social History:  Ambulatory walker prosthesis wheelchair bound,     reports that he has been smoking cigarettes. He started smoking about 51 years ago. He  has been smoking an average of .25 packs per day. He has never used smokeless tobacco. He reports that he does not currently use alcohol. He reports that he does not currently use drugs.     Family History:   Family History  Problem Relation Age of Onset   Other Neg Hx    ______________________________________________________________________________________________ Allergies: Allergies  Allergen Reactions   Iohexol Hives    Patient broke out in hives after injection of Omni 300, will need 13 hour pre-med in future   Ozempic (0.25 Or 0.5 Mg-Dose) [Semaglutide(0.25 Or 0.5mg -Dos)] Other (See Comments)    pancreatitis   Shellfish Allergy Hives   Milk (Cow) Diarrhea    Finding of gastrointestinal tract gas   Zestril [Lisinopril] Cough   Zocor [Simvastatin] Itching and Cough     Prior to Admission medications   Medication Sig Start Date End Date Taking? Authorizing Provider  acetaminophen (TYLENOL) 500 MG tablet Take 1,000 mg by mouth 2 (two) times daily as needed for mild pain.   Yes [provider]  aspirin EC 81 MG tablet Take 81 mg by mouth in the morning.   Yes [provider]  atorvastatin (LIPITOR) 80 MG tablet Take 1 tablet (80 mg total) by mouth at bedtime. 08/30/21  Yes Wendall Stade, MD  clopidogrel (PLAVIX) 75 MG tablet Take 75 mg by mouth daily.   Yes [provider]  cyclobenzaprine (FLEXERIL) 10 MG tablet Take 10 mg by mouth 3 (three) times daily as needed for muscle spasms. 03/29/20  Yes [provider]  dicyclomine (BENTYL) 20 MG tablet Take 20 mg by mouth in the morning and at bedtime.   Yes [provider]  docusate sodium (COLACE) 100 MG capsule Take 100 mg by mouth daily as needed for mild constipation.  Yes [provider]  empagliflozin (JARDIANCE) 25 MG TABS tablet Take 1 tablet (25 mg total) by mouth daily. Patient taking differently: Take 12.5 mg by mouth daily. 08/30/21  Yes Wendall Stade, MD  ezetimibe  (ZETIA) 10 MG tablet Take 10 mg by mouth daily.   Yes [provider]  folic acid (FOLVITE) 1 MG tablet Take 1 tablet (1 mg total) by mouth daily. 10/11/19  Yes Rodolph Bong, MD  furosemide (LASIX) 40 MG tablet Take 1 tablet (40 mg total) by mouth 2 (two) times daily for 4 days, THEN 1 tablet (40 mg total) daily. Patient taking differently: Take 1 tablet (40 mg total) by mouth daily. 08/31/21 06/19/23 Yes Bhagat, Bhavinkumar, PA  gabapentin (NEURONTIN) 300 MG capsule Take 300-600 mg by mouth See admin instructions. Take 1 capsule (300 mg) by mouth in the morning, take 1 capsule (300 mg) by mouth at lunch, & take 2 capsules (600 mg) by mouth at bedtime   Yes [provider]  hydrOXYzine (ATARAX) 25 MG tablet Take 25 mg by mouth at bedtime as needed for anxiety (sleep).   Yes [provider]  insulin aspart (NOVOLOG) 100 UNIT/ML injection Inject 10 Units into the skin 3 (three) times daily with meals. Patient taking differently: Inject 20 Units into the skin 3 (three) times daily with meals. Sliding Scale Insulin 07/19/21  Yes Evlyn Kanner, MD  insulin glargine (LANTUS) 100 UNIT/ML Solostar Pen 30units in am and 35unit at night  Further adjustment per your pcp and endocrinology Goal of a1c is less than 7%, currently your a1c is 10%, not at goal Patient taking differently: Inject 30 Units into the skin 2 (two) times daily. Further adjustment per your pcp and endocrinology Goal of a1c is less than 7%, currently your a1c is 10%, not at goal 06/03/21  Yes Albertine Grates, MD  lipase/protease/amylase (CREON) 36000 UNITS CPEP capsule Take 1 capsule (36,000 Units total) by mouth 3 (three) times daily before meals. 06/17/19  Yes Roberto Scales D, MD  lurasidone (LATUDA) 40 MG TABS tablet Take 40 mg by mouth every evening.   Yes [provider]  magnesium oxide (MAG-OX) 400 (241.3 Mg) MG tablet Take 1 tablet (400 mg total) by mouth 2 (two) times daily. 03/18/20  Yes Burnadette Pop, MD  metoprolol succinate (TOPROL-XL) 100 MG 24 hr tablet Take 0.5 mg by mouth daily at 6 (six) AM. 09/27/21  Yes [provider]  Multiple Vitamin (QUINTABS) TABS Take 1 tablet by mouth daily.   Yes [provider]  naloxone (NARCAN) nasal spray 4 mg/0.1 mL Place 1 spray into the nose as needed (opioid reversal).   Yes [provider]  naltrexone (DEPADE) 50 MG tablet Take 50 mg by mouth daily.   Yes [provider]  nitroGLYCERIN (NITROSTAT) 0.4 MG SL tablet Place 1 tablet (0.4 mg total) under the tongue every 5 (five) minutes as needed for chest pain. 07/19/21  Yes Evlyn Kanner, MD  pantoprazole (PROTONIX) 40 MG tablet Take 40 mg by mouth in the morning. 10/10/20  Yes [provider]  polyethylene glycol (MIRALAX / GLYCOLAX) 17 g packet Take 17 g by mouth daily as needed for mild constipation. 03/18/20  Yes Adhikari, Willia Craze, MD  potassium chloride SA (KLOR-CON M) 20 MEQ tablet Take 1 tablet (20 mEq total) by mouth daily. Patient taking differently: Take 20 mEq by mouth daily after lunch. 08/31/21  Yes Bhagat, Bhavinkumar, PA  pregabalin (LYRICA) 150 MG capsule Take 150 mg  by mouth 2 (two) times daily.   Yes [provider]  ranolazine (RANEXA) 500 MG 12 hr tablet Take 1 tablet by mouth daily at 6 (six) AM. 09/27/21  Yes [provider]  sacubitril-valsartan (ENTRESTO) 49-51 MG Take 1 tablet by mouth 2 (two) times daily. 11/01/21  Yes [provider]  sertraline (ZOLOFT) 100 MG tablet Take 50 mg by mouth daily.   Yes [provider]  tamsulosin (FLOMAX) 0.4 MG CAPS capsule Take 0.4 mg by mouth daily after supper.   Yes [provider]  thiamine 100 MG tablet Take 1 tablet (100 mg total) by mouth daily. 10/11/19  Yes Rodolph Bong, MD  vitamin B-12 (CYANOCOBALAMIN) 500 MCG tablet Take 500 mcg by mouth in the morning. 01/20/20  Yes [provider]  buPROPion (WELLBUTRIN SR) 150 MG 12 hr tablet Take  150 mg by mouth in the morning and at bedtime. Patient not taking: Reported on 06/19/2022    [provider]  Insulin Pen Needle (NOVOFINE) 30G X 8 MM MISC Inject 10 each into the skin as needed. 10/10/19   Rodolph Bong, MD    ___________________________________________________________________________________________________ Physical Exam:    06/19/2022   10:15 PM 06/19/2022   10:00 PM 06/19/2022    9:00 PM  Vitals with BMI  Systolic 136 109 161  Diastolic 91 97 98  Pulse 84 84 85    1. General:  in No  Acute distress  * Chronically ill *well *cachectic *toxic acutely ill -appearing 2. Psychological: Alert and   Oriented 3. Head/ENT:    Dry Mucous Membranes                          Head Non traumatic, neck supple                           Poor Dentition 4. SKIN:  decreased Skin turgor,  Skin clean Dry and intact no rash    5. Heart: Regular rate and rhythm no  Murmur, no Rub or gallop 6. Lungs:   no wheezes or crackles   7. Abdomen: Soft,  non-tender, Non distended    obese  bowel sounds present 8. Lower extremities: no clubbing, cyanosis, no  edema right BKA 9. Neurologically Grossly intact, moving all 4 extremities equally  10. MSK: Normal range of motion    Chart has been reviewed  ______________________________________________________________________________________________  Assessment/Plan 61 y.o. male with medical history significant of  CAD, DM2, CHF, alcohol abuse, tobacco abuse, chronic hypoxic respiratory failure 3 L, PTSD, peripheral vascular disease s/p right BKA, obstructive sleep apnea on CPAP,    Admitted for  Lactic acidosis,  Hypotension, unspecified hypotension type Fall, vs presyncope     Present on Admission:  Lactic acidosis  Mixed hyperlipidemia due to type 2 diabetes mellitus (HCC)  Alcohol abuse  Essential hypertension  Chronic combined systolic and diastolic CHF (congestive heart failure) (HCC)  Tobacco abuse  Hyponatremia      Diabetes mellitus with diabetic neuropathy, with long-term current use of insulin (HCC) Order sliding scale Hold Jardiance for Resume Lantus but decrease down to 20 units  Mixed hyperlipidemia due to type 2 diabetes mellitus (HCC) Chronic stable continue Lipitor 80 mg a day  Lactic acidosis Recurrent may be secondary to poor clearance due to hepatic steatosis rehydrate and repeat  Alcohol abuse Order CIWA protocol  Essential hypertension Hypertension allow permissive hypertension for today  Chronic combined systolic and diastolic CHF (congestive heart failure) (HCC) Current appears to be slightly on the dry side and hypotensive gently rehydrate hold home medications for CHF for tonight and continue to monitor fluid status  S/P BKA (below knee amputation) (HCC) Chronic stable secondary to PAD  Tobacco abuse  - Spoke about importance of quitting spent 5 minutes discussing options for treatment, prior attempts at quitting, and dangers of smoking  -At this point patient is    interested in quitting  - order nicotine patch   - nursing tobacco cessation protocol   Hyponatremia Follow serial bmet, gently rehydrate, obtain urine elctrolytes  Postural dizziness with presyncope In the setting of hypotension responded well to IVF  Gently rehydrate  Obtain echo  Cardiac enzymes  At this time does not seem to be interested in quiitng drinking but is working on decreasing tobacco abuse   Other plan as per orders.  DVT prophylaxis:  SCD      Code Status:    Code Status: Prior FULL CODE  as per patient   I had personally discussed CODE STATUS with patient   ACP none   Family Communication:   Family not at  Bedside    Diet  Diet Orders (From admission, onward)     Start     Ordered   06/19/22 1730  Diet NPO time specified  Diet effective now        06/19/22 1732            Disposition Plan:        To home once workup is complete and patient is stable   Following  barriers for discharge:                            Electrolytes corrected                       BP stable       Consult Orders  (From admission, onward)           Start     Ordered   06/19/22 2108  Consult to hospitalist  Paged by Annice Pih  Once       Provider:  (Not yet assigned)  Question Answer Comment  Place call to: [facility]   Reason for Consult Admit      06/19/22 2107                               Would benefit from PT/OT eval prior to DC  Ordered                                      Diabetes care coordinator                                  Nutrition    consulted                                       Consults called: none   Admission status:  ED Disposition     ED Disposition  Admit   Condition  --   Comment  Hospital Area:  [facility] [[id number]]  Level of Care: Progressive [102]  Admit to Progressive based on following criteria: MULTISYSTEM THREATS such as stable sepsis, metabolic/electrolyte imbalance with or without encephalopathy that is responding to early treatment.  May place patient in observation at [facility] or [facility] if equivalent level of care is available:: No  Covid Evaluation: Asymptomatic - no recent exposure (last 10 days) testing not required  Diagnosis: Lactic acidosis [161096]  Admitting Physician: Therisa Doyne [[id number]]  Attending Physician: Therisa Doyne [[id number]]            Obs    Level of care         progressive tele indefinitely please discontinue once patient no longer qualifies COVID-19 Labs     Ricardo Howard 06/20/2022, 1:04 AM    [facility]     after 2 AM please page floor coverage PA If 7AM-7PM, please contact the day team taking care of the patient using [email or web address]

## 2022-06-19 NOTE — ED Provider Notes (Signed)
[place] EMERGENCY DEPARTMENT AT [facility] Provider Note   CSN: [id number] Arrival date & time: 06/19/22  1718     History  Chief Complaint  Patient presents with   Ricardo Howard is a 62 y.o. male with a past medical history of alcohol abuse, insulin-dependent diabetes, history of DKA, heart failure with reduced ejection fracture, history of nicotine dependence and NSTEMI, chronic hypoxic respiratory failure on 3 L of oxygen, PTSD, status post right below-knee amputation who presents to the emergency department after fall.  Patient drinks daily.  He reports about a sixpack a day.  He is already had about that much prior to arrival.  Patient arrived as a level 2 trauma.  He is on Plavix.  His wife called EMS after the patient fell.  EMS reports finding him on a hardwood floor with his head under a bench.  No obvious head injury present.  He was also found to have significant hypotension.  It appears he has a history of hypovolemia and hypotension.  He denies palpitations or loss of consciousness.  He has been alert but lethargic since he was initially seen by EMS.  He has no significant complaints except for neck pain.  He has a c-collar in place without paresthesias.  ATLS protocol utilized   Fall       Home Medications Prior to Admission medications   Medication Sig Start Date End Date Taking? Authorizing Provider  acetaminophen (TYLENOL) 500 MG tablet Take 1,000 mg by mouth 2 (two) times daily as needed for mild pain.    [provider]  aspirin EC 81 MG tablet Take 81 mg by mouth in the morning.    [provider]  atorvastatin (LIPITOR) 80 MG tablet Take 1 tablet (80 mg total) by mouth at bedtime. 08/30/21   Wendall Stade, MD  buPROPion (WELLBUTRIN SR) 150 MG 12 hr tablet Take 150 mg by mouth in the morning and at bedtime.    [provider]  carvedilol (COREG) 6.25 MG tablet Take 1 tablet (6.25 mg total) by mouth 2 (two) times  daily with a meal. 08/31/21   Bhagat, Bhavinkumar, PA  clopidogrel (PLAVIX) 75 MG tablet Take 75 mg by mouth in the morning.    [provider]  empagliflozin (JARDIANCE) 25 MG TABS tablet Take 1 tablet (25 mg total) by mouth daily. 08/30/21   Wendall Stade, MD  ezetimibe (ZETIA) 10 MG tablet Take 1 tablet (10 mg total) by mouth daily. 08/30/21   Wendall Stade, MD  folic acid (FOLVITE) 1 MG tablet Take 1 tablet (1 mg total) by mouth daily. 10/11/19   Rodolph Bong, MD  furosemide (LASIX) 40 MG tablet Take 1 tablet (40 mg total) by mouth 2 (two) times daily for 4 days, THEN 1 tablet (40 mg total) daily. Patient taking differently: Take 1 tablet (40 mg total) by mouth daily. 08/31/21 12/03/21  Bhagat, Sharrell Ku, PA  gabapentin (NEURONTIN) 300 MG capsule Take 300-600 mg by mouth See admin instructions. Take 1 capsule (300 mg) by mouth in the morning, take 1 capsule (300 mg) by mouth at lunch, & take 2 capsules (600 mg) by mouth at bedtime    [provider]  insulin aspart (NOVOLOG) 100 UNIT/ML injection Inject 10 Units into the skin 3 (three) times daily with meals. Patient taking differently: Inject 10-20 Units into the skin 3 (three) times daily with meals. Sliding Scale Insulin 07/19/21   Evlyn Kanner,  MD  insulin glargine (LANTUS) 100 UNIT/ML Solostar Pen 30units in am and 35unit at night  Further adjustment per your pcp and endocrinology Goal of a1c is less than 7%, currently your a1c is 10%, not at goal Patient taking differently: 30-35 Units See admin instructions. 30units in am and 35unit at night  Further adjustment per your pcp and endocrinology Goal of a1c is less than 7%, currently your a1c is 10%, not at goal 06/03/21   Albertine Grates, MD  Insulin Pen Needle (NOVOFINE) 30G X 8 MM MISC Inject 10 each into the skin as needed. 10/10/19   Rodolph Bong, MD  isosorbide mononitrate (IMDUR) 60 MG 24 hr tablet Take 30 mg by mouth in the morning.    [provider]  lipase/protease/amylase (CREON) 36000 UNITS CPEP capsule Take 1 capsule (36,000 Units total) by mouth 3 (three) times daily before meals. 06/17/19   Laverna Peace, MD  lurasidone (LATUDA) 40 MG TABS tablet Take 40 mg by mouth every evening.    [provider]  magnesium oxide (MAG-OX) 400 (241.3 Mg) MG tablet Take 1 tablet (400 mg total) by mouth 2 (two) times daily. 03/18/20   Burnadette Pop, MD  mesalamine (LIALDA) 1.2 g EC tablet Take 1.2 g by mouth in the morning. 11/10/20   [provider]  metoprolol succinate (TOPROL-XL) 100 MG 24 hr tablet Take 0.5 mg by mouth daily at 6 (six) AM. 09/27/21   [provider]  naloxone [facility]) nasal spray 4 mg/0.1 mL Place 1 spray into the nose as needed (opioid reversal).    [provider]  nitroGLYCERIN (NITROSTAT) 0.4 MG SL tablet Place 1 tablet (0.4 mg total) under the tongue every 5 (five) minutes as needed for chest pain. 07/19/21   Evlyn Kanner, MD  pantoprazole (PROTONIX) 40 MG tablet Take 40 mg by mouth in the morning. 10/10/20   [provider]  polyethylene glycol (MIRALAX / GLYCOLAX) 17 g packet Take 17 g by mouth daily as needed for mild constipation. 03/18/20   Burnadette Pop, MD  potassium chloride SA (KLOR-CON M) 20 MEQ tablet Take 1 tablet (20 mEq total) by mouth daily. 08/31/21   Bhagat, Sharrell Ku, PA  pregabalin (LYRICA) 150 MG capsule Take 150 mg by mouth 2 (two) times daily.    [provider]  ranolazine (RANEXA) 500 MG 12 hr tablet Take 1 tablet by mouth daily at 6 (six) AM. 09/27/21   [provider]  sacubitril-valsartan (ENTRESTO) 24-26 MG Take 1 tablet by mouth 2 (two) times daily. 09/07/21   Sharlene Dory, PA-C  sertraline (ZOLOFT) 100 MG tablet Take 100 mg by mouth daily.    [provider]  tamsulosin (FLOMAX) 0.4 MG CAPS capsule Take 0.4 mg by mouth daily. 01/20/20   [provider]  thiamine 100 MG tablet Take 1 tablet (100 mg total) by mouth  daily. 10/11/19   Rodolph Bong, MD  vitamin B-12 (CYANOCOBALAMIN) 500 MCG tablet Take 500 mcg by mouth in the morning. 01/20/20   [provider]      Allergies    Iohexol, Ozempic (0.25 or 0.5 mg-dose) [semaglutide(0.25 or 0.5mg -dos)], Shellfish allergy, Milk (cow), Zestril [lisinopril], and Zocor [simvastatin]    Review of Systems   Review of Systems  Physical Exam Updated Vital Signs BP 90/62   Pulse 92   Temp 98.1 F (36.7 C) (Oral)   Resp (!) 21   Ht 5\' 9"  (1.753 m)   Wt 105.7 kg  SpO2 98%   BMI 34.41 kg/m  Physical Exam Vitals and nursing note reviewed.  Constitutional:      Appearance: Normal appearance. He is obese. He is not ill-appearing or toxic-appearing.     Interventions: Cervical collar in place.  HENT:     Head: Normocephalic and atraumatic.     Right Ear: Tympanic membrane normal.     Left Ear: Tympanic membrane normal.     Mouth/Throat:     Mouth: Mucous membranes are moist.  Eyes:     Extraocular Movements: Extraocular movements intact.     Pupils: Pupils are equal, round, and reactive to light.  Neck:     Comments: C collar in place Cardiovascular:     Rate and Rhythm: Normal rate and regular rhythm.  Pulmonary:     Effort: Pulmonary effort is normal.     Breath sounds: Normal breath sounds. No wheezing.  Abdominal:     General: Abdomen is protuberant.     Tenderness: There is no abdominal tenderness.  Musculoskeletal:     Comments: Mood is all extremities without ataxia, normal strength and sensation.  No obvious deformities.  No midline spinal tenderness, normal rectal tone, able to move lower extremities, right BKA with normal-appearing stump, no deformities or lesions.  Neurological:     Mental Status: He is lethargic.  Psychiatric:        Behavior: Behavior is cooperative.    ED Results / Procedures / Treatments   Labs (all labs ordered are listed, but only abnormal results are displayed) Labs Reviewed - No data to  display  EKG EKG Interpretation  Date/Time:  Tuesday June 19 2022 17:29:54 EDT Ventricular Rate:  90 PR Interval:  141 QRS Duration: 102 QT Interval:  372 QTC Calculation: 456 R Axis:   63 Text Interpretation: Sinus rhythm Confirmed by Lockie Mola, Adam ([id number]) on 06/20/2022 8:18:23 AM  Radiology No results found.  Procedures Procedures    Medications Ordered in ED Medications - No data to display  ED Course/ Medical Decision Making/ A&P Clinical Course as of 06/19/22 2335  Tue Jun 19, 2022  1721 L2 trauma, fall, ETOH OB [CC]  1846 Anion gap(!): 17 [AH]  1848 DG Chest Port 1 View [AH]  1848 DG Pelvis Portable [AH]  1858 Lactic Acid, Venous(!!): 4.6 Patient with lactic acidosis.  He has been admitted for the same thing.  Suspect this is due to alcohol abuse.  Fluids ordered, thiamine ordered, half-normal saline with dextrose ordered. [AH]    Clinical Course User Index [AH] Arthor Captain, PA-C [CC] Glyn Ade, MD                             Medical Decision Making Amount and/or Complexity of Data Reviewed Labs: ordered. Decision-making details documented in ED Course. Radiology: ordered. Decision-making details documented in ED Course.  Risk Prescription drug management. Decision regarding hospitalization.   This patient presents to the ED for concern of trauma weakness, hypotension, this involves an extensive number of treatment options, and is a complaint that carries with it a high risk of complications and morbidity.  The emergent differential diagnosis for trauma is extensive and requires complex medical decision making. The differential includes, but is not limited to traumatic brain injury, Orbital trauma, maxillofacial trauma, skull fracture, blunt/penetrating neck trauma, vertebral artery dissection, whiplash, cervical fracture, neurogenic shock, spinal cord injury, thoracic trauma (blunt/penetrating) cardiac trauma, thoracic and lumbar spine trauma.  Abdominal trauma (blunt.  Penetrating), genitourinary trauma, extremity fractures, skin lacerations/ abrasions, vascular injuries.   Co morbidities: etoh abuse , dm, bka  Social Determinants of Health:  [facility]/ [facility], lives with wife  Additional history:  {Additional history obtained from ems I revewed external records including recent admissiona nd labs at [facility] for same problems  Lab Tests:  I Ordered, and personally interpreted labs.  The pertinent results include:   Lactic acid 4.6 Etoh 92 NA 130 Gap-17 UA- no ketones  Imaging Studies:  I ordered imaging studies including Plain films chest and pelvis, ct head and cspine I independently visualized and interpreted imaging which showed no acute findings I agree with the radiologist interpretation  Cardiac Monitoring/ECG:  The patient was maintained on a cardiac monitor.  I personally viewed and interpreted the cardiac monitored which showed an underlying rhythm of: nsr  Medicines ordered and prescription drug management:  I ordered medication including  Medications  insulin aspart (novoLOG) injection 0-9 Units (1 Units Subcutaneous Given 06/20/22 0828)  LORazepam (ATIVAN) tablet 1-4 mg (has no administration in time range)    Or  LORazepam (ATIVAN) injection 1-4 mg (has no administration in time range)  folic acid (FOLVITE) tablet 1 mg (1 mg Oral Given 06/20/22 0825)  multivitamin with minerals tablet 1 tablet (1 tablet Oral Given 06/20/22 0825)  aspirin EC tablet 81 mg (81 mg Oral Given 06/20/22 0825)  atorvastatin (LIPITOR) tablet 80 mg (has no administration in time range)  clopidogrel (PLAVIX) tablet 75 mg (75 mg Oral Given 06/20/22 0825)  ezetimibe (ZETIA) tablet 10 mg (10 mg Oral Given 06/20/22 0825)  insulin glargine-yfgn (SEMGLEE) injection 20 Units (has no administration in time range)  lipase/protease/amylase (CREON) capsule 36,000 Units (36,000 Units Oral Given 06/20/22 0825)  lurasidone (LATUDA) tablet 40 mg  (has no administration in time range)  pantoprazole (PROTONIX) EC tablet 40 mg (40 mg Oral Given 06/20/22 0825)  pregabalin (LYRICA) capsule 150 mg (150 mg Oral Given 06/20/22 0824)  ranolazine (RANEXA) 12 hr tablet 500 mg (500 mg Oral Given 06/20/22 0603)  naltrexone (DEPADE) tablet 50 mg (has no administration in time range)  0.9 %  sodium chloride infusion ( Intravenous New Bag/Given 06/20/22 0252)  acetaminophen (TYLENOL) tablet 650 mg (has no administration in time range)    Or  acetaminophen (TYLENOL) suppository 650 mg (has no administration in time range)  HYDROcodone-acetaminophen (NORCO/VICODIN) 5-325 MG per tablet 1-2 tablet (has no administration in time range)  ondansetron (ZOFRAN) tablet 4 mg (has no administration in time range)    Or  ondansetron (ZOFRAN) injection 4 mg (has no administration in time range)  albuterol (PROVENTIL) (2.5 MG/3ML) 0.083% nebulizer solution 2.5 mg (has no administration in time range)  gabapentin (NEURONTIN) capsule 300 mg (300 mg Oral Given 06/20/22 0825)    And  gabapentin (NEURONTIN) capsule 600 mg (has no administration in time range)  thiamine (VITAMIN B1) 500 mg in sodium chloride 0.9 % 50 mL IVPB ( Intravenous See Alternative 06/20/22 0824)    Or  thiamine (VITAMIN B1) tablet 500 mg (500 mg Oral Given 06/20/22 0824)  sodium chloride 0.9 % bolus 1,000 mL (0 mLs Intravenous Stopped 06/19/22 1849)  thiamine (VITAMIN B1) injection 100 mg (100 mg Intravenous Given 06/19/22 2010)  dextrose 5 % and 0.45 % NaCl with KCl 20 mEq/L infusion (0 mLs Intravenous Stopped 06/20/22 0252)  lactated ringers bolus 2,000 mL (0 mLs Intravenous Stopped 06/19/22 2139)   for dehydration, suspected etoh/ starvation acidosis, thiamine to prevent wernicke/korsakoff Reevaluation  of the patient after these medicines showed that the patient improved I have reviewed the patients home medicines and have made adjustments as needed  Test Considered:    Critical  Interventions:  fluids  Consultations Obtained: Dr. Adela Glimpse for admission  Problem List / ED Course:     ICD-10-CM   1. Lactic acidosis  E87.20     2. Hypotension, unspecified hypotension type  I95.9     3. Fall, initial encounter  [email or web address] Skeens       MDM: patient here with fall, acidosis, hypotension. Suspect all is related to heavy etoh abuse. Admit for fluids, resolution of acidosis.   Dispostion:  After consideration of the diagnostic results and the patients response to treatment, I feel that the patent would benefit from admission.   { Final Clinical Impression(s) / ED Diagnoses Final diagnoses:  Lactic acidosis  Hypotension, unspecified hypotension type  Fall, initial encounter    Rx / DC Orders ED Discharge Orders     None         Arthor Captain, PA-C 06/20/22 1610    Glyn Ade, MD 06/22/22 [phone]

## 2022-06-19 NOTE — Progress Notes (Signed)
Orthopedic Tech Progress Note Patient Details:  Ricardo Howard [birth year] [id number]  Level 2 trauma   Patient ID: Ricardo Howard, male   DOB: Jan 14, 1961, 62 y.o.   MRN: [id number]  Ricardo Howard 06/19/2022, 5:43 PM

## 2022-06-19 NOTE — ED Notes (Signed)
Wife Nicolaas Savo [phone] would like an update asap

## 2022-06-19 NOTE — Progress Notes (Signed)
   06/19/22 1720  Spiritual Encounters  Type of Visit Initial  Care provided to: Pt not available  Referral source Trauma page  Reason for visit Trauma  OnCall Visit Yes   Chaplain responded to level two trauma. The patient, Ricardo Howard, is attended to by the medical team. No family is present at this time. If a chaplain is requested someone will respond.   [facility]  [phone]

## 2022-06-19 NOTE — ED Triage Notes (Signed)
Pt arrived via [facility] for level 2, fall on thinners (Plavix) from home. Unwitnessed fall, pt reports dizziness prior to fall, +LOC. Pt was assisted to recliner after fall, able to answer questions appropriately, endorses neck pain, generalized headache. Pt/family on scene endorses chronic alcoholism with ETOH use today. Pt was hypotensive with EMS, 98/62 -> 88/58, 700cc NS administered via 20g RFA.   PTA EMS Vitals

## 2022-06-19 NOTE — Subjective & Objective (Signed)
Pt came in after a fall on Plavix, unwitnessed fall,  Pt has been feeling light headed  Hx  of etoh abuse and diabetes   On EMS arrival patient is hypotensive 98/62 then down to 88/58 IV fluids was given IV fluids administered Patient has chronic respiratory failure now at baseline on 3 L.  He drinks about 3 packs a day had alcohol today.  Patient fell down on the hardwood floor and hit his head under the bench.  Since his fall patient has been complain about neck pain

## 2022-06-19 NOTE — ED Notes (Signed)
Trauma Response Nurse Documentation  Ricardo Howard is a 62 y.o. male arriving to [facility] ED via EMS  On clopidogrel 75 mg daily. Trauma was activated as a Level 2 based on the following trauma criteria Elderly patients > 65 with head trauma on anti-coagulation (excluding ASA).   Patient cleared for CT by [facility]. Pt transported to CT with trauma response nurse present to monitor. RN remained with the patient throughout their absence from the department for clinical observation.  GCS 15.  History   Past Medical History:  Diagnosis Date   Alcohol abuse 06/14/2019   Anxiety    Back pain    Cataracts, bilateral    CHF (congestive heart failure) (HCC)    Depression    Diabetic retinopathy (HCC)    Essential hypertension 06/14/2019   Hepatitis C    cured   Hx of BKA, right (HCC)    Hyperlipidemia    Hypertension    Mixed hyperlipidemia due to type 2 diabetes mellitus (HCC) 07/31/2019   Nicotine dependence, cigarettes, uncomplicated 07/31/2019   PAD (peripheral artery disease) (HCC)    Pancreatitis    PTSD (post-traumatic stress disorder)    Uncontrolled type 2 diabetes mellitus with diabetic polyneuropathy, with long-term current use of insulin 07/31/2019     Past Surgical History:  Procedure Laterality Date   ABDOMINAL AORTOGRAM W/LOWER EXTREMITY N/A 12/02/2020   Procedure: ABDOMINAL AORTOGRAM W/LOWER EXTREMITY;  Surgeon: Leonie Douglas, MD;  Location: [facility];  Service: Cardiovascular;  Laterality: N/A;   AMPUTATION Right 03/13/2020   Procedure: TRANSMETATARSAL AMPUTATION;  Surgeon: Chuck Hint, MD;  Location: [facility] OR;  Service: Vascular;  Laterality: Right;   AMPUTATION Right 03/15/2020   Procedure: RIGHT BELOW KNEE AMPUTATION;  Surgeon: Chuck Hint, MD;  Location: [facility] OR;  Service: Vascular;  Laterality: Right;   CORONARY ANGIOPLASTY WITH STENT PLACEMENT     CORONARY STENT INTERVENTION N/A 07/16/2021   Procedure: CORONARY STENT  INTERVENTION;  Surgeon: Orbie Pyo, MD;  Location: [facility];  Service: Cardiovascular;  Laterality: N/A;   LEFT HEART CATH AND CORONARY ANGIOGRAPHY N/A 07/16/2021   Procedure: LEFT HEART CATH AND CORONARY ANGIOGRAPHY;  Surgeon: Orbie Pyo, MD;  Location: [facility];  Service: Cardiovascular;  Laterality: N/A;   PERIPHERAL VASCULAR INTERVENTION  12/02/2020   Procedure: PERIPHERAL VASCULAR INTERVENTION;  Surgeon: Leonie Douglas, MD;  Location: [facility];  Service: Cardiovascular;;  Lt SFA, LEIA     Initial Focused Assessment (If applicable, or please see trauma documentation): Patient A&Ox4, GCS 15, PERR 3 Unwitnessed fall from home, per patient was dizzy and +LOC Airway intact, bilateral breath sounds, cough that he says is "baseline" Pulses 2+, R BKA  CT's Completed:   CT Head and CT C-Spine   Interventions:  IV, labs 1L NS CXR/PXR CT Head/Cspine  Plan for disposition:  Admission to floor   Event Summary: Patient to ED after an unwitnessed fall. +LOC, found by wife and assisted to chair by wife and HH aid. Imaging revealed no traumatic injuries. Internal medicine consulted for medical work-up. Hand-off with ED RN.   Bedside handoff with ED RN [place].    Jill Side Abdulai Blaylock  Trauma Response RN  Please call TRN at [phone] for further assistance.

## 2022-06-19 NOTE — H&P (Incomplete)
Ricardo Howard ZOX:[id number] DOB: Oct 16, 1960 DOA: 06/19/2022     PCP: [facility]   Outpatient Specialists: * NONE CARDS: * Dr. Charlton Haws, MD  NEphrology: *  Dr. NEurology *   Dr. Pulmonary *  Dr.  Oncology * Dr. Sandria Manly* Dr.  Deboraha Sprang, [facility]) No care team member to display Urology Dr. *  Patient arrived to ER on 06/19/22 at 1718 Referred by Attending Glyn Ade, MD   Patient coming from:    home Live With family    Chief Complaint:   Chief Complaint  Patient presents with  . Fall    HPI: Ricardo Howard is a 62 y.o. male with medical history significant of  CAD, DM2, CHF, alcohol abuse, tobacco abuse, chronic hypoxic respiratory failure 3 L, PTSD    Presented with   * Pt came in after a fall on Plavix, unwitnessed fall,  Pt has been feeling light headed  Hx  of etoh abuse and diabetes   On EMS arrival patient is hypotensive 98/62 then down to 88/58 IV fluids was given IV fluids administered Patient has chronic respiratory failure now at baseline on 3 L.  He drinks about 3 packs a day had alcohol today.  Patient fell down on the hardwood floor and hit his head under the bench.  Since his fall patient has been complain about neck pain        Denies significant ETOH intake *** Does not smoke*** but interested in quitting***  Lab Results  Component Value Date   SARSCOV2NAA NEGATIVE 05/30/2021   SARSCOV2NAA NEGATIVE 07/18/2020   SARSCOV2NAA NEGATIVE 03/18/2020   SARSCOV2NAA NEGATIVE 03/12/2020        Regarding pertinent Chronic problems:    Hyperlipidemia - on statins  Lipitor (atorvastatin) Zetia Lipid Panel     Component Value Date/Time   CHOL 249 (H) 07/16/2021 1448   TRIG 58 07/16/2021 1448   HDL 44 07/16/2021 1448   CHOLHDL 5.7 07/16/2021 1448   VLDL 12 07/16/2021 1448   LDLCALC 193 (H) 07/16/2021 1448     HTN on Coreg   chronic CHF diastolic/systolic/ combined -  On Lasix last echo*** Recent Results (from the past 81191  hour(s))  ECHOCARDIOGRAM COMPLETE   Collection Time: 05/31/21 12:18 PM  Result Value   Weight 4,240   Height 68   BP 145/89   S' Lateral 4.10   AR max vel 2.47   AV Area VTI 2.28   AV Mean grad 3.0   AV Peak grad 6.2   Ao pk vel 1.25   Area-P 1/2 4.31   AV Area mean vel 2.33   Narrative      ECHOCARDIOGRAM REPORT   IMPRESSIONS  1. Left ventricular ejection fraction, by estimation, is 40 to 45%. The left ventricle has mildly decreased function. Left ventricular endocardial border not optimally defined to evaluate regional wall motion. Left ventricular diastolic parameters are  consistent with Grade I diastolic dysfunction (impaired relaxation).  2. Right ventricular systolic function is normal. The right ventricular size is normal. There is normal pulmonary artery systolic pressure. The estimated right ventricular systolic pressure is 31.5 mmHg.  3. The mitral valve is normal in structure. No evidence of mitral valve regurgitation. No evidence of mitral stenosis.  4. The aortic valve is tricuspid. Aortic valve regurgitation is not visualized. Aortic valve sclerosis/calcification is present, without any evidence of aortic stenosis. Aortic valve area, by VTI measures 2.28 cm. Aortic valve mean gradient measures  3.0 mmHg.  Aortic valve Vmax measures 1.25 m/s.  5. The inferior vena cava is normal in size with greater than 50% respiratory variability, suggesting right atrial pressure of 3 mmHg.  6. Recommend limited study with definity contrast to assess for focal wall motion abnormalities due to poor acoustical windows         CAD  - On Aspirin, statin, betablocker, Plavix                 - followed by cardiology                - last cardiac cath 5/23   .  High-grade calcified proximal right coronary artery lesion treated with atherectomy and 1 drug-eluting stent with patent previously placed mid to distal right coronary artery stent and proximal left circumflex stent, the latter with  moderate in-stent restenosis. 2.  Very focal mid moderate LAD lesion. 3.  LVEDP of 19 mmHg.    DM 2 -  Lab Results  Component Value Date   HGBA1C 10.9 (H) 07/16/2021   on insulin,        obesity-   BMI Readings from Last 1 Encounters:  06/19/22 34.41 kg/m    COPD - not **followed by pulmonology *** not  on baseline oxygen  *L,    *** OSA -on nocturnal oxygen, *CPAP, *noncompliant with CPAP  *** Hx of CVA - *with/out residual deficits on Aspirin 81 mg, 325, Plavix  ***A. Fib -  - CHA2DS2 vas score **** CHA2DS2/VAS Stroke Risk Points      N/A >= 2 Points: High Risk  1 - 1.99 Points: Medium Risk  0 Points: Low Risk    Last Change: N/A      This score determines the patient's risk of having a stroke if the  patient has atrial fibrillation.      This score is not applicable to this patient. Components are not  calculated.     current  on anticoagulation with ****Coumadin  ***Xarelto,* Eliquis,  *** Not on anticoagulation secondary to Risk of Falls, *** recurrent bleeding         -  Rate control:  Currently controlled with ***Toprolol,  *Metoprolol,* Diltiazem, *Coreg          - Rhythm control: *** amiodarone, *flecainide    Liver disease MELD 3.0: 12 at 06/19/2022  5:36 PM MELD-Na: 7 at 06/19/2022  5:36 PM Calculated from: Serum Creatinine: 1.00 mg/dL at 1/61/0960  4:54 PM Serum Sodium: 131 mmol/L at 06/19/2022  5:36 PM Total Bilirubin: 0.8 mg/dL (Using min of 1 mg/dL) at 0/98/1191  4:78 PM Serum Albumin: 3.2 g/dL at 2/95/6213  0:86 PM INR(ratio): 1.1 at 06/19/2022  5:25 PM Age at listing (hypothetical): 17 years Sex: Male at 06/19/2022  5:36 PM     BPH - on Flomax,        While in ER: Clinical Course as of 06/19/22 2325  Tue Jun 19, 2022  1721 L2 trauma, fall, ETOH OB [CC]  1846 Anion gap(!): 17 [AH]  1848 DG Chest Port 1 View [AH]  1848 DG Pelvis Portable [AH]  1858 Lactic Acid, Venous(!!): 4.6 Patient with lactic acidosis.  He has been admitted for the same  thing.  Suspect this is due to alcohol abuse.  Fluids ordered, thiamine ordered, half-normal saline with dextrose ordered. [AH]    Clinical Course User Index [AH] Arthor Captain, PA-C [CC] Glyn Ade, MD       Lab Orders  Comprehensive metabolic panel         CBC         Ethanol         Urinalysis, Routine w reflex microscopic -Urine, Clean Catch         Lactic acid, plasma         Protime-INR         I-Stat Chem 8, ED      CT HEAD   NON acute  CT spine no acute  Pelvis  No acute fracture or dislocation. 2. Mild-to-moderate degenerative changes of both hips.  CXR -  NON acute    Following Medications were ordered in ER: Medications  sodium chloride 0.9 % bolus 1,000 mL (0 mLs Intravenous Stopped 06/19/22 1849)  thiamine (VITAMIN B1) injection 100 mg (100 mg Intravenous Given 06/19/22 2010)  dextrose 5 % and 0.45 % NaCl with KCl 20 mEq/L infusion ( Intravenous New Bag/Given 06/19/22 2004)  lactated ringers bolus 2,000 mL (0 mLs Intravenous Stopped 06/19/22 2139)       ED Triage Vitals  Enc Vitals Group     BP 06/19/22 1721 90/62     Pulse Rate 06/19/22 1720 92     Resp 06/19/22 1720 (!) 21     Temp 06/19/22 1728 98.1 F (36.7 C)     Temp Source 06/19/22 1728 Oral     SpO2 06/19/22 1720 98 %     Weight 06/19/22 1722 233 lb (105.7 kg)     Height 06/19/22 1723 5\' 9"  (1.753 m)     Head Circumference --      Peak Flow --      Pain Score 06/19/22 1724 6     Pain Loc --      Pain Edu? --      Excl. in GC? --   TMAX(24)@     _________________________________________ Significant initial  Findings: Abnormal Labs Reviewed  COMPREHENSIVE METABOLIC PANEL - Abnormal; Notable for the following components:      Result Value   Sodium 130 (*)    CO2 14 (*)    Glucose, Bld 126 (*)    Albumin 3.2 (*)    Anion gap 17 (*)    All other components within normal limits  CBC - Abnormal; Notable for the following components:   RDW 15.8 (*)    All other components  within normal limits  ETHANOL - Abnormal; Notable for the following components:   Alcohol, Ethyl (B) 92 (*)    All other components within normal limits  URINALYSIS, ROUTINE W REFLEX MICROSCOPIC - Abnormal; Notable for the following components:   Glucose, UA >=500 (*)    Bacteria, UA RARE (*)    All other components within normal limits  LACTIC ACID, PLASMA - Abnormal; Notable for the following components:   Lactic Acid, Venous 4.6 (*)    All other components within normal limits  I-STAT CHEM 8, ED - Abnormal; Notable for the following components:   Sodium 131 (*)    Glucose, Bld 130 (*)    Calcium, Ion 1.12 (*)    TCO2 16 (*)    All other components within normal limits      _________________________ Troponin ***ordered Cardiac Panel (last 3 results) No results for input(s): "CKTOTAL", "CKMB", "TROPONINIHS", "RELINDX" in the last 72 hours.   ECG: Ordered Personally reviewed and interpreted by me showing: HR : *** Rhythm: *NSR, Sinus tachycardia * A.fib. W RVR, RBBB, LBBB, Paced Ischemic changes*nonspecific changes, no evidence of  ischemic changes QTC*  BNP (last 3 results) Recent Labs    07/16/21 1037  BNP 56.4     COVID-19 Labs  No results for input(s): "DDIMER", "FERRITIN", "LDH", "CRP" in the last 72 hours.  Lab Results  Component Value Date   SARSCOV2NAA NEGATIVE 05/30/2021   SARSCOV2NAA NEGATIVE 07/18/2020   SARSCOV2NAA NEGATIVE 03/18/2020   SARSCOV2NAA NEGATIVE 03/12/2020       ____________________ This patient meets SIRS Criteria and may be septic. SIRS = Systemic Inflammatory Response Syndrome  Order a lactic acid level if needed AND/OR Initiate the sepsis protocol with the attached order set OR Click "Treating Associated Infection or Illness" if the patient is being treated for an infection that is a known cause of these abnormalities     The recent clinical data is shown below. Vitals:   06/19/22 2100 06/19/22 2200 06/19/22 2205 06/19/22 2215   BP: (!) 146/98 (!) 109/97  (!) 136/91  Pulse: 85 84  84  Resp: 20 20  19   Temp:   97.7 F (36.5 C)   TempSrc:   Oral   SpO2: 99% 98%  100%  Weight:      Height:            WBC     Component Value Date/Time   WBC 6.4 06/19/2022 1725   LYMPHSABS 0.9 07/16/2021 1037   MONOABS 1.0 07/16/2021 1037   EOSABS 0.0 07/16/2021 1037   BASOSABS 0.0 07/16/2021 1037        Lactic Acid, Venous    Component Value Date/Time   LATICACIDVEN 4.6 (HH) 06/19/2022 1725      Lactic Acid, Venous    Component Value Date/Time   LATICACIDVEN 4.6 (HH) 06/19/2022 1725    Procalcitonin *** Ordered      UA *** no evidence of UTI  ***Pending ***not ordered   Urine analysis:    Component Value Date/Time   COLORURINE YELLOW 06/19/2022 1730   APPEARANCEUR CLEAR 06/19/2022 1730   LABSPEC 1.007 06/19/2022 1730   PHURINE 5.0 06/19/2022 1730   GLUCOSEU >=500 (A) 06/19/2022 1730   HGBUR NEGATIVE 06/19/2022 1730   BILIRUBINUR NEGATIVE 06/19/2022 1730   KETONESUR NEGATIVE 06/19/2022 1730   PROTEINUR NEGATIVE 06/19/2022 1730   NITRITE NEGATIVE 06/19/2022 1730   LEUKOCYTESUR NEGATIVE 06/19/2022 1730    Results for orders placed or performed during the hospital encounter of 05/30/21  Resp Panel by RT-PCR (Flu A&B, Covid) Nasopharyngeal Swab     Status: None   Collection Time: 05/30/21  8:15 PM   Specimen: Nasopharyngeal Swab; Nasopharyngeal(NP) swabs in vial transport medium  Result Value Ref Range Status   SARS Coronavirus 2 by RT PCR NEGATIVE NEGATIVE Final    Comment: (NOTE) SARS-CoV-2 target nucleic acids are NOT DETECTED.  The SARS-CoV-2 RNA is generally detectable in upper respiratory specimens during the acute phase of infection. The lowest concentration of SARS-CoV-2 viral copies this assay can detect is 138 copies/mL. A negative result does not preclude SARS-Cov-2 infection and should not be used as the sole basis for treatment or other patient management decisions. A negative  result may occur with  improper specimen collection/handling, submission of specimen other than nasopharyngeal swab, presence of viral mutation(s) within the areas targeted by this assay, and inadequate number of viral copies(<138 copies/mL). A negative result must be combined with clinical observations, patient history, and epidemiological information. The expected result is Negative.  Fact Sheet for Patients:  BloggerCourse.com  Fact Sheet for Healthcare Providers:  SeriousBroker.it  This test  is no t yet approved or cleared by the [place] and  has been authorized for detection and/or diagnosis of SARS-CoV-2 by FDA under an Emergency Use Authorization (EUA). This EUA will remain  in effect (meaning this test can be used) for the duration of the COVID-19 declaration under Section 564(b)(1) of the Act, 21 U.S.C.section 360bbb-3(b)(1), unless the authorization is terminated  or revoked sooner.       Influenza A by PCR NEGATIVE NEGATIVE Final   Influenza B by PCR NEGATIVE NEGATIVE Final    Comment: (NOTE) The Xpert Xpress SARS-CoV-2/FLU/RSV plus assay is intended as an aid in the diagnosis of influenza from Nasopharyngeal swab specimens and should not be used as a sole basis for treatment. Nasal washings and aspirates are unacceptable for Xpert Xpress SARS-CoV-2/FLU/RSV testing.  Fact Sheet for Patients: BloggerCourse.com  Fact Sheet for Healthcare Providers: SeriousBroker.it  This test is not yet approved or cleared by the [place] FDA and has been authorized for detection and/or diagnosis of SARS-CoV-2 by FDA under an Emergency Use Authorization (EUA). This EUA will remain in effect (meaning this test can be used) for the duration of the COVID-19 declaration under Section 564(b)(1) of the Act, 21 U.S.C. section 360bbb-3(b)(1), unless the authorization is  terminated or revoked.  Performed at [facility], [address], [place], [place] [address]   Culture, blood (routine x 2)     Status: None   Collection Time: 05/30/21 11:54 PM   Specimen: BLOOD RIGHT ARM  Result Value Ref Range Status   Specimen Description BLOOD RIGHT ARM  Final   Special Requests   Final    BOTTLES DRAWN AEROBIC AND ANAEROBIC Blood Culture adequate volume   Culture   Final    NO GROWTH 5 DAYS Performed at [facility], [address] [place], [place] [address]    Report Status 06/05/2021 FINAL  Final  Culture, blood (routine x 2)     Status: None   Collection Time: 05/30/21 11:59 PM   Specimen: BLOOD RIGHT HAND  Result Value Ref Range Status   Specimen Description BLOOD RIGHT HAND  Final   Special Requests   Final    BOTTLES DRAWN AEROBIC AND ANAEROBIC Blood Culture adequate volume   Culture   Final    NO GROWTH 5 DAYS Performed at [facility], [address], [place], [place] [address]    Report Status 06/05/2021 FINAL  Final  Respiratory (~20 pathogens) panel by PCR     Status: None   Collection Time: 05/31/21  5:00 AM   Specimen: Nasopharyngeal Swab; Respiratory  Result Value Ref Range Status   Adenovirus NOT DETECTED NOT DETECTED Final   Coronavirus 229E NOT DETECTED NOT DETECTED Final    Comment: (NOTE) The Coronavirus on the Respiratory Panel, DOES NOT test for the novel  Coronavirus (2019 nCoV)    Coronavirus HKU1 NOT DETECTED NOT DETECTED Final   Coronavirus NL63 NOT DETECTED NOT DETECTED Final   Coronavirus OC43 NOT DETECTED NOT DETECTED Final   Metapneumovirus NOT DETECTED NOT DETECTED Final   Rhinovirus / Enterovirus NOT DETECTED NOT DETECTED Final   Influenza A NOT DETECTED NOT DETECTED Final   Influenza B NOT DETECTED NOT DETECTED Final   Parainfluenza Virus 1 NOT DETECTED NOT DETECTED Final   Parainfluenza Virus 2 NOT DETECTED NOT DETECTED Final   Parainfluenza Virus 3 NOT DETECTED NOT DETECTED Final    Parainfluenza Virus 4 NOT DETECTED NOT DETECTED Final  Respiratory Syncytial Virus NOT DETECTED NOT DETECTED Final   Bordetella pertussis NOT DETECTED NOT DETECTED Final   Bordetella Parapertussis NOT DETECTED NOT DETECTED Final   Chlamydophila pneumoniae NOT DETECTED NOT DETECTED Final   Mycoplasma pneumoniae NOT DETECTED NOT DETECTED Final    Comment: Performed at [facility], [address], [place], [place] [address]  MRSA Next Gen by PCR, Nasal     Status: None   Collection Time: 05/31/21  7:57 AM   Specimen: Nasal Mucosa; Nasal Swab  Result Value Ref Range Status   MRSA by PCR Next Gen NOT DETECTED NOT DETECTED Final    Comment: (NOTE) The GeneXpert MRSA Assay (FDA approved for NASAL specimens only), is one component of a comprehensive MRSA colonization surveillance program. It is not intended to diagnose MRSA infection nor to guide or monitor treatment for MRSA infections. Test performance is not FDA approved in patients less than 30 years old. Performed at [facility], [address], [place], [place] [address]   C Difficile Quick Screen w PCR reflex     Status: None   Collection Time: 06/01/21 10:06 PM   Specimen: STOOL  Result Value Ref Range Status   C Diff antigen NEGATIVE NEGATIVE Final   C Diff toxin NEGATIVE NEGATIVE Final   C Diff interpretation No C. difficile detected.  Final    Comment: Performed at [facility], [address] [place], [place] [address]    ABX started Antibiotics Given (last 72 hours)     None       No results found for the last 90 days.     ________________________________________________________________  Arterial ***Venous  Blood Gas result:  pH *** pCO2 ***; pO2 ***;     %O2 Sat ***.  ABG    Component Value Date/Time   HCO3 27.3 07/16/2021 1431   TCO2 16 (L) 06/19/2022 1736   ACIDBASEDEF 7.0 (H) 05/31/2021 0029   O2SAT 99 07/16/2021 1431        __________________________________________________________ Recent Labs  Lab 06/19/22 1725 06/19/22 1736  NA 130* 131*  K 4.0 3.7  CO2 14*  --   GLUCOSE 126* 130*  BUN 15 15  CREATININE 1.07 1.00  CALCIUM 8.9  --     Cr  * stable,  Up from baseline see below Lab Results  Component Value Date   CREATININE 1.00 06/19/2022   CREATININE 1.07 06/19/2022   CREATININE 0.88 09/07/2021    Recent Labs  Lab 06/19/22 1725  AST 22  ALT 25  ALKPHOS 81  BILITOT 0.8  PROT 7.0  ALBUMIN 3.2*   Lab Results  Component Value Date   CALCIUM 8.9 06/19/2022   PHOS 4.2 07/18/2020          Plt: Lab Results  Component Value Date   PLT 158 06/19/2022         Recent Labs  Lab 06/19/22 1725 06/19/22 1736  WBC 6.4  --   HGB 14.7 16.3  HCT 43.9 48.0  MCV 94.8  --   PLT 158  --     HG/HCT * stable,  Down *Up from baseline see below    Component Value Date/Time   HGB 16.3 06/19/2022 1736   HCT 48.0 06/19/2022 1736   MCV 94.8 06/19/2022 1725      No results for input(s): "LIPASE", "AMYLASE" in the last 168 hours. No results for input(s): "AMMONIA" in the last 168 hours.    .lab  _______________________________________________ Hospitalist  was called for admission for *** Lactic acidosis ***  Hypotension, unspecified hypotension type ***  Fall, initial encounter ***    The following Work up has been ordered so far:  Orders Placed This Encounter  Procedures  . DG Chest Port 1 View  . DG Pelvis Portable  . CT HEAD WO CONTRAST  . CT CERVICAL SPINE WO CONTRAST  . Comprehensive metabolic panel  . CBC  . Ethanol  . Urinalysis, Routine w reflex microscopic -Urine, Clean Catch  . Lactic acid, plasma  . Protime-INR  . Diet NPO time specified  . ED Cardiac monitoring  . Measure blood pressure  . Initiate Carrier Fluid Protocol  . Consult to hospitalist  . I-Stat Chem 8, ED  . EKG 12-Lead  . Sample to Blood Bank     OTHER Significant initial   Findings:  labs showing:     DM  labs:  HbA1C: Recent Labs    07/16/21 1403  HGBA1C 10.9*       CBG (last 3)  No results for input(s): "GLUCAP" in the last 72 hours.        Cultures:    Component Value Date/Time   SDES BLOOD RIGHT HAND 05/30/2021 2359   SPECREQUEST  05/30/2021 2359    BOTTLES DRAWN AEROBIC AND ANAEROBIC Blood Culture adequate volume   CULT  05/30/2021 2359    NO GROWTH 5 DAYS Performed at [facility], [address], [place], [place] [address]    REPTSTATUS 06/05/2021 FINAL 05/30/2021 2359     Radiological Exams on Admission: DG Chest Port 1 View  Addendum Date: 06/19/2022   ADDENDUM REPORT: 06/19/2022 23:20 FINDINGS: There is no evidence for focal lung infiltrate, pleural effusion or pneumothorax. The cardiomediastinal silhouette is within normal limits. No acute fractures are seen. Strandy opacities in the lung bases are favored is atelectasis or scarring and unchanged from prior. IMPRESSION: No acute cardiopulmonary process. Electronically Signed   By: Darliss Cheney M.D.   On: 06/19/2022 23:20   Result Date: 06/19/2022 CLINICAL DATA:  Trauma EXAM: PORTABLE CHEST 1 VIEW COMPARISON:  Chest x-ray 06/04/2022 FINDINGS: The heart size and mediastinal contours are within normal limits. There some strandy opacities in both lung bases. Pleural effusion or pneumothorax. The visualized skeletal structures are unremarkable. IMPRESSION: Strandy opacities in both lung bases, likely atelectasis.  A Electronically Signed: By: Darliss Cheney M.D. On: 06/19/2022 17:52   CT HEAD WO CONTRAST  Result Date: 06/19/2022 CLINICAL DATA:  Head trauma, moderate-severe; Polytrauma, blunt. Fall. Dizziness. On Plavix. EXAM: CT HEAD WITHOUT CONTRAST CT CERVICAL SPINE WITHOUT CONTRAST TECHNIQUE: Multidetector CT imaging of the head and cervical spine was performed following the standard protocol without intravenous contrast. Multiplanar CT image reconstructions of the cervical spine  were also generated. RADIATION DOSE REDUCTION: This exam was performed according to the departmental dose-optimization program which includes automated exposure control, adjustment of the mA and/or kV according to patient size and/or use of iterative reconstruction technique. COMPARISON:  Head CT 06/04/2022 FINDINGS: CT HEAD FINDINGS Brain: There is no evidence of an acute infarct, intracranial hemorrhage, mass, midline shift, or extra-axial fluid collection. Patchy hypodensities in the cerebral white matter bilaterally are unchanged and nonspecific but compatible with moderate chronic small vessel ischemic disease. Chronic lacunar infarcts are again seen in the right thalamus and left cerebellar hemisphere. The ventricles and sulci are within normal limits for age. Vascular: Calcified atherosclerosis at the skull base. No hyperdense vessel. Skull: No acute fracture or suspicious osseous  lesion. Sinuses/Orbits: Mild mucosal thickening in the paranasal sinuses. Clear mastoid air cells. Unremarkable orbits. Other: None. CT CERVICAL SPINE FINDINGS Alignment: Mild straightening of the normal cervical lordosis. Trace anterolisthesis of C4 on C5, likely degenerative. Skull base and vertebrae: No acute fracture or suspicious osseous lesion. Soft tissues and spinal canal: No prevertebral fluid or swelling. No visible canal hematoma. Disc levels: Mild multilevel disc degeneration. Severe facet arthrosis on the left at C3-4 and on the right at C4-5. Upper chest: No mass or consolidation in the included lung apices. Other: Mild calcific atherosclerosis at the carotid bifurcations. IMPRESSION: 1. No evidence of acute intracranial abnormality. 2. Moderate chronic small vessel ischemic disease. 3. No acute cervical spine fracture. Electronically Signed   By: Sebastian Ache M.D.   On: 06/19/2022 20:05   CT CERVICAL SPINE WO CONTRAST  Result Date: 06/19/2022 CLINICAL DATA:  Head trauma, moderate-severe; Polytrauma, blunt. Fall.  Dizziness. On Plavix. EXAM: CT HEAD WITHOUT CONTRAST CT CERVICAL SPINE WITHOUT CONTRAST TECHNIQUE: Multidetector CT imaging of the head and cervical spine was performed following the standard protocol without intravenous contrast. Multiplanar CT image reconstructions of the cervical spine were also generated. RADIATION DOSE REDUCTION: This exam was performed according to the departmental dose-optimization program which includes automated exposure control, adjustment of the mA and/or kV according to patient size and/or use of iterative reconstruction technique. COMPARISON:  Head CT 06/04/2022 FINDINGS: CT HEAD FINDINGS Brain: There is no evidence of an acute infarct, intracranial hemorrhage, mass, midline shift, or extra-axial fluid collection. Patchy hypodensities in the cerebral white matter bilaterally are unchanged and nonspecific but compatible with moderate chronic small vessel ischemic disease. Chronic lacunar infarcts are again seen in the right thalamus and left cerebellar hemisphere. The ventricles and sulci are within normal limits for age. Vascular: Calcified atherosclerosis at the skull base. No hyperdense vessel. Skull: No acute fracture or suspicious osseous lesion. Sinuses/Orbits: Mild mucosal thickening in the paranasal sinuses. Clear mastoid air cells. Unremarkable orbits. Other: None. CT CERVICAL SPINE FINDINGS Alignment: Mild straightening of the normal cervical lordosis. Trace anterolisthesis of C4 on C5, likely degenerative. Skull base and vertebrae: No acute fracture or suspicious osseous lesion. Soft tissues and spinal canal: No prevertebral fluid or swelling. No visible canal hematoma. Disc levels: Mild multilevel disc degeneration. Severe facet arthrosis on the left at C3-4 and on the right at C4-5. Upper chest: No mass or consolidation in the included lung apices. Other: Mild calcific atherosclerosis at the carotid bifurcations. IMPRESSION: 1. No evidence of acute intracranial abnormality.  2. Moderate chronic small vessel ischemic disease. 3. No acute cervical spine fracture. Electronically Signed   By: Sebastian Ache M.D.   On: 06/19/2022 20:05   DG Pelvis Portable  Result Date: 06/19/2022 CLINICAL DATA:  Trauma EXAM: PORTABLE PELVIS 1-2 VIEWS COMPARISON:  None Available. FINDINGS: There is no evidence of pelvic fracture or diastasis. No pelvic bone lesions are seen. There are mild moderate degenerative changes of both hips. Peripheral vascular calcifications are present. IMPRESSION: 1. No acute fracture or dislocation. 2. Mild-to-moderate degenerative changes of both hips. Electronically Signed   By: Darliss Cheney M.D.   On: 06/19/2022 17:51   _______________________________________________________________________________________________________ Latest  Blood pressure (!) 136/91, pulse 84, temperature 97.7 F (36.5 C), temperature source Oral, resp. rate 19, height 5\' 9"  (1.753 m), weight 105.7 kg, SpO2 100 %.   Vitals  labs and radiology finding personally reviewed  Review of Systems:    Pertinent positives include: ***  Constitutional:  No weight loss,  night sweats, Fevers, chills, fatigue, weight loss  HEENT:  No headaches, Difficulty swallowing,Tooth/dental problems,Sore throat,  No sneezing, itching, ear ache, nasal congestion, post nasal drip,  Cardio-vascular:  No chest pain, Orthopnea, PND, anasarca, dizziness, palpitations.no Bilateral lower extremity swelling  GI:  No heartburn, indigestion, abdominal pain, nausea, vomiting, diarrhea, change in bowel habits, loss of appetite, melena, blood in stool, hematemesis Resp:  no shortness of breath at rest. No dyspnea on exertion, No excess mucus, no productive cough, No non-productive cough, No coughing up of blood.No change in color of mucus.No wheezing. Skin:  no rash or lesions. No jaundice GU:  no dysuria, change in color of urine, no urgency or frequency. No straining to urinate.  No flank pain.  Musculoskeletal:   No joint pain or no joint swelling. No decreased range of motion. No back pain.  Psych:  No change in mood or affect. No depression or anxiety. No memory loss.  Neuro: no localizing neurological complaints, no tingling, no weakness, no double vision, no gait abnormality, no slurred speech, no confusion  All systems reviewed and apart from HOPI all are negative _______________________________________________________________________________________________ Past Medical History:   Past Medical History:  Diagnosis Date  . Alcohol abuse 06/14/2019  . Anxiety   . Back pain   . Cataracts, bilateral   . CHF (congestive heart failure) (HCC)   . Depression   . Diabetic retinopathy (HCC)   . Essential hypertension 06/14/2019  . Hepatitis C    cured  . Hx of BKA, right (HCC)   . Hyperlipidemia   . Hypertension   . Mixed hyperlipidemia due to type 2 diabetes mellitus (HCC) 07/31/2019  . Nicotine dependence, cigarettes, uncomplicated 07/31/2019  . PAD (peripheral artery disease) (HCC)   . Pancreatitis   . PTSD (post-traumatic stress disorder)   . Uncontrolled type 2 diabetes mellitus with diabetic polyneuropathy, with long-term current use of insulin 07/31/2019      Past Surgical History:  Procedure Laterality Date  . ABDOMINAL AORTOGRAM W/LOWER EXTREMITY N/A 12/02/2020   Procedure: ABDOMINAL AORTOGRAM W/LOWER EXTREMITY;  Surgeon: Leonie Douglas, MD;  Location: [facility];  Service: Cardiovascular;  Laterality: N/A;  . AMPUTATION Right 03/13/2020   Procedure: TRANSMETATARSAL AMPUTATION;  Surgeon: Chuck Hint, MD;  Location: [facility] OR;  Service: Vascular;  Laterality: Right;  . AMPUTATION Right 03/15/2020   Procedure: RIGHT BELOW KNEE AMPUTATION;  Surgeon: Chuck Hint, MD;  Location: [facility] OR;  Service: Vascular;  Laterality: Right;  . CORONARY ANGIOPLASTY WITH STENT PLACEMENT    . CORONARY STENT INTERVENTION N/A 07/16/2021   Procedure: CORONARY STENT INTERVENTION;   Surgeon: Orbie Pyo, MD;  Location: [facility];  Service: Cardiovascular;  Laterality: N/A;  . LEFT HEART CATH AND CORONARY ANGIOGRAPHY N/A 07/16/2021   Procedure: LEFT HEART CATH AND CORONARY ANGIOGRAPHY;  Surgeon: Orbie Pyo, MD;  Location: [facility];  Service: Cardiovascular;  Laterality: N/A;  . PERIPHERAL VASCULAR INTERVENTION  12/02/2020   Procedure: PERIPHERAL VASCULAR INTERVENTION;  Surgeon: Leonie Douglas, MD;  Location: [facility];  Service: Cardiovascular;;  Lt SFA, LEIA    Social History:  Ambulatory *** independently cane, walker  wheelchair bound, bed bound     reports that he has been smoking cigarettes. He started smoking about 51 years ago. He has been smoking an average of .25 packs per day. He has never used smokeless tobacco. He reports that he does not currently use alcohol. He reports that he does  not currently use drugs.     Family History: *** Family History  Problem Relation Age of Onset  . Other Neg Hx    ______________________________________________________________________________________________ Allergies: Allergies  Allergen Reactions  . Iohexol Hives    Patient broke out in hives after injection of Omni 300, will need 13 hour pre-med in future  . Ozempic (0.25 Or 0.5 Mg-Dose) [Semaglutide(0.25 Or 0.5mg -Dos)] Other (See Comments)    pancreatitis  . Shellfish Allergy Hives  . Milk (Cow) Diarrhea    Finding of gastrointestinal tract gas  . Zestril [Lisinopril] Cough  . Zocor [Simvastatin] Itching and Cough     Prior to Admission medications   Medication Sig Start Date End Date Taking? Authorizing Provider  acetaminophen (TYLENOL) 500 MG tablet Take 1,000 mg by mouth 2 (two) times daily as needed for mild pain.   Yes [provider]  aspirin EC 81 MG tablet Take 81 mg by mouth in the morning.   Yes [provider]  atorvastatin (LIPITOR) 80 MG tablet Take 1 tablet (80 mg total) by mouth at  bedtime. 08/30/21  Yes Wendall Stade, MD  clopidogrel (PLAVIX) 75 MG tablet Take 75 mg by mouth daily.   Yes [provider]  cyclobenzaprine (FLEXERIL) 10 MG tablet Take 10 mg by mouth 3 (three) times daily as needed for muscle spasms. 03/29/20  Yes [provider]  dicyclomine (BENTYL) 20 MG tablet Take 20 mg by mouth in the morning and at bedtime.   Yes [provider]  docusate sodium (COLACE) 100 MG capsule Take 100 mg by mouth daily as needed for mild constipation.   Yes [provider]  empagliflozin (JARDIANCE) 25 MG TABS tablet Take 1 tablet (25 mg total) by mouth daily. Patient taking differently: Take 12.5 mg by mouth daily. 08/30/21  Yes Wendall Stade, MD  ezetimibe (ZETIA) 10 MG tablet Take 10 mg by mouth daily.   Yes [provider]  folic acid (FOLVITE) 1 MG tablet Take 1 tablet (1 mg total) by mouth daily. 10/11/19  Yes Rodolph Bong, MD  furosemide (LASIX) 40 MG tablet Take 1 tablet (40 mg total) by mouth 2 (two) times daily for 4 days, THEN 1 tablet (40 mg total) daily. Patient taking differently: Take 1 tablet (40 mg total) by mouth daily. 08/31/21 06/19/23 Yes Bhagat, Bhavinkumar, PA  gabapentin (NEURONTIN) 300 MG capsule Take 300-600 mg by mouth See admin instructions. Take 1 capsule (300 mg) by mouth in the morning, take 1 capsule (300 mg) by mouth at lunch, & take 2 capsules (600 mg) by mouth at bedtime   Yes [provider]  hydrOXYzine (ATARAX) 25 MG tablet Take 25 mg by mouth at bedtime as needed for anxiety (sleep).   Yes [provider]  insulin aspart (NOVOLOG) 100 UNIT/ML injection Inject 10 Units into the skin 3 (three) times daily with meals. Patient taking differently: Inject 20 Units into the skin 3 (three) times daily with meals. Sliding Scale Insulin 07/19/21  Yes Evlyn Kanner, MD  insulin glargine (LANTUS) 100 UNIT/ML Solostar Pen 30units in am and 35unit at night  Further adjustment per your pcp  and endocrinology Goal of a1c is less than 7%, currently your a1c is 10%, not at goal Patient taking differently: Inject 30 Units into the skin 2 (two) times daily. Further adjustment per your pcp and endocrinology Goal of a1c is less than 7%, currently your a1c is 10%, not at goal 06/03/21  Yes Albertine Grates, MD  lipase/protease/amylase (CREON) 36000 UNITS CPEP capsule Take 1 capsule (36,000 Units total) by mouth 3 (three) times daily before meals. 06/17/19  Yes Roberto Scales D, MD  lurasidone (LATUDA) 40 MG TABS tablet Take 40 mg by mouth every evening.   Yes [provider]  magnesium oxide (MAG-OX) 400 (241.3 Mg) MG tablet Take 1 tablet (400 mg total) by mouth 2 (two) times daily. 03/18/20  Yes Burnadette Pop, MD  metoprolol succinate (TOPROL-XL) 100 MG 24 hr tablet Take 0.5 mg by mouth daily at 6 (six) AM. 09/27/21  Yes [provider]  Multiple Vitamin (QUINTABS) TABS Take 1 tablet by mouth daily.   Yes [provider]  naloxone (NARCAN) nasal spray 4 mg/0.1 mL Place 1 spray into the nose as needed (opioid reversal).   Yes [provider]  naltrexone (DEPADE) 50 MG tablet Take 50 mg by mouth daily.   Yes [provider]  nitroGLYCERIN (NITROSTAT) 0.4 MG SL tablet Place 1 tablet (0.4 mg total) under the tongue every 5 (five) minutes as needed for chest pain. 07/19/21  Yes Evlyn Kanner, MD  pantoprazole (PROTONIX) 40 MG tablet Take 40 mg by mouth in the morning. 10/10/20  Yes [provider]  polyethylene glycol (MIRALAX / GLYCOLAX) 17 g packet Take 17 g by mouth daily as needed for mild constipation. 03/18/20  Yes Adhikari, Willia Craze, MD  potassium chloride SA (KLOR-CON M) 20 MEQ tablet Take 1 tablet (20 mEq total) by mouth daily. Patient taking differently: Take 20 mEq by mouth daily after lunch. 08/31/21  Yes Bhagat, Bhavinkumar, PA  pregabalin (LYRICA) 150 MG capsule Take 150 mg by mouth 2 (two) times daily.   Yes [provider]   ranolazine (RANEXA) 500 MG 12 hr tablet Take 1 tablet by mouth daily at 6 (six) AM. 09/27/21  Yes [provider]  sacubitril-valsartan (ENTRESTO) 49-51 MG Take 1 tablet by mouth 2 (two) times daily. 11/01/21  Yes [provider]  sertraline (ZOLOFT) 100 MG tablet Take 50 mg by mouth daily.   Yes [provider]  tamsulosin (FLOMAX) 0.4 MG CAPS capsule Take 0.4 mg by mouth daily after supper.   Yes [provider]  thiamine 100 MG tablet Take 1 tablet (100 mg total) by mouth daily. 10/11/19  Yes Rodolph Bong, MD  vitamin B-12 (CYANOCOBALAMIN) 500 MCG tablet Take 500 mcg by mouth in the morning. 01/20/20  Yes [provider]  buPROPion (WELLBUTRIN SR) 150 MG 12 hr tablet Take 150 mg by mouth in the morning and at bedtime. Patient not taking: Reported on 06/19/2022    [provider]  Insulin Pen Needle (NOVOFINE) 30G X 8 MM MISC Inject 10 each into the skin as needed. 10/10/19   Rodolph Bong, MD    ___________________________________________________________________________________________________ Physical Exam:    06/19/2022   10:15 PM 06/19/2022   10:00 PM 06/19/2022    9:00 PM  Vitals with BMI  Systolic 136 109 161  Diastolic 91 97 98  Pulse 84 84 85     1. General:  in No ***Acute distress***increased work of breathing ***complaining of severe pain****agitated * Chronically ill *well *cachectic *toxic acutely ill -appearing 2. Psychological: Alert and *** Oriented 3. Head/ENT:   Moist *** Dry Mucous Membranes                          Head Non traumatic, neck supple  Normal *** Poor Dentition 4. SKIN: normal *** decreased Skin turgor,  Skin clean Dry and intact no rash    5. Heart: Regular rate and rhythm no*** Murmur, no Rub or gallop 6. Lungs: ***Clear to auscultation bilaterally, no wheezes or crackles   7. Abdomen: Soft, ***non-tender, Non distended *** obese ***bowel sounds present 8. Lower  extremities: no clubbing, cyanosis, no ***edema 9. Neurologically Grossly intact, moving all 4 extremities equally *** strength 5 out of 5 in all 4 extremities cranial nerves II through XII intact 10. MSK: Normal range of motion    Chart has been reviewed  ______________________________________________________________________________________________  Assessment/Plan  ***  Admitted for *** Lactic acidosis ***  Hypotension, unspecified hypotension type ***  Fall, initial encounter ***    Present on Admission: **None**     No problem-specific Assessment & Plan notes found for this encounter.    Other plan as per orders.  DVT prophylaxis:  SCD *** Lovenox       Code Status:    Code Status: Prior FULL CODE *** DNR/DNI ***comfort care as per patient ***family  I had personally discussed CODE STATUS with patient and family* I had spent *min discussing goals of care and CODE STATUS ACP has been reviewed ***   Family Communication:   Family not at  Bedside  plan of care was discussed on the phone with *** Son, Daughter, Wife, Husband, Sister, Brother , father, mother  Diet  Diet Orders (From admission, onward)     Start     Ordered   06/19/22 1730  Diet NPO time specified  Diet effective now        06/19/22 1732            Disposition Plan:   *** likely will need placement for rehabilitation                          Back to current facility when stable                            To home once workup is complete and patient is stable  ***Following barriers for discharge:                            Electrolytes corrected                               Anemia corrected                             Pain controlled with PO medications                               Afebrile, white count improving able to transition to PO antibiotics                             Will need to be able to tolerate PO                            Will likely need home health, home O2, set  up  Will need consultants to evaluate patient prior to discharge  ****EXPECT DC tomorrow       Consult Orders  (From admission, onward)           Start     Ordered   06/19/22 2108  Consult to hospitalist  Paged by Annice Pih  Once       Provider:  (Not yet assigned)  Question Answer Comment  Place call to: [facility]   Reason for Consult Admit      06/19/22 2107                              ***Would benefit from PT/OT eval prior to DC  Ordered                   Swallow eval - SLP ordered                   Diabetes care coordinator                   Transition of care consulted                   Nutrition    consulted                  Wound care  consulted                   Palliative care    consulted                   Behavioral health  consulted                    Consults called: ***     Admission status:  ED Disposition     ED Disposition  Admit   Condition  --   Comment  The patient appears reasonably stabilized for admission considering the current resources, flow, and capabilities available in the ED at this time, and I doubt any other [facility] requiring further screening and/or treatment in the ED prior to admission is  present.           Obs***  ***  inpatient     I Expect 2 midnight stay secondary to severity of patient's current illness need for inpatient interventions justified by the following: ***hemodynamic instability despite optimal treatment (tachycardia *hypotension * tachypnea *hypoxia, hypercapnia) * Severe lab/radiological/exam abnormalities including:     and extensive comorbidities including: *substance abuse  *Chronic pain *DM2  * CHF * CAD  * COPD/asthma *Morbid Obesity * CKD *dementia *liver disease *history of stroke with residual deficits *  malignancy, * sickle cell disease  History of amputation Chronic anticoagulation  That are currently affecting medical management.   I  expect  patient to be hospitalized for 2 midnights requiring inpatient medical care.  Patient is at high risk for adverse outcome (such as loss of life or disability) if not treated.  Indication for inpatient stay as follows:  Severe change from baseline regarding mental status Hemodynamic instability despite maximal medical therapy,  ongoing suicidal ideations,  severe pain requiring acute inpatient management,  inability to maintain oral hydration   persistent chest pain despite medical management Need for operative/procedural  intervention New or worsening hypoxia   Need for IV antibiotics, IV fluids, IV rate controling medications, IV antihypertensives, IV pain medications, IV anticoagulation, need for biPAP    Level  of care   *** tele  For 12H 24H     medical floor       progressive tele indefinitely please discontinue once patient no longer qualifies COVID-19 Labs    Lab Results  Component Value Date   SARSCOV2NAA NEGATIVE 05/30/2021     Precautions: admitted as *** Covid Negative  ***asymptomatic screening protocol****PUI *** covid positive No active isolations ***If Covid PCR is negative  - please DC precautions - would need additional investigation given very high risk for false native test result    Critical***  Patient is critically ill due to  hemodynamic instability * respiratory failure *severe sepsis* ongoing chest pain*  They are at high risk for life/limb threatening clinical deterioration requiring frequent reassessment and modifications of care.  Services provided include examination of the patient, review of relevant ancillary tests, prescription of lifesaving therapies, review of medications and prophylactic therapy.  Total critical care time excluding separately billable procedures: 60*  Minutes.    Amarionna Arca 06/19/2022, 11:25 PM ***  [facility]     after 2 AM please page floor coverage PA If 7AM-7PM, please contact the day team  taking care of the patient using [email or web address]

## 2022-06-20 ENCOUNTER — Ambulatory Visit (HOSPITAL_COMMUNITY): Payer: No Typology Code available for payment source

## 2022-06-20 ENCOUNTER — Other Ambulatory Visit (HOSPITAL_COMMUNITY): Payer: Self-pay

## 2022-06-20 ENCOUNTER — Encounter (HOSPITAL_COMMUNITY): Payer: Self-pay | Admitting: Internal Medicine

## 2022-06-20 DIAGNOSIS — E1140 Type 2 diabetes mellitus with diabetic neuropathy, unspecified: Secondary | ICD-10-CM | POA: Diagnosis not present

## 2022-06-20 DIAGNOSIS — I110 Hypertensive heart disease with heart failure: Secondary | ICD-10-CM | POA: Diagnosis not present

## 2022-06-20 DIAGNOSIS — I5042 Chronic combined systolic (congestive) and diastolic (congestive) heart failure: Secondary | ICD-10-CM | POA: Diagnosis present

## 2022-06-20 DIAGNOSIS — Z89519 Acquired absence of unspecified leg below knee: Secondary | ICD-10-CM

## 2022-06-20 DIAGNOSIS — R42 Dizziness and giddiness: Secondary | ICD-10-CM

## 2022-06-20 DIAGNOSIS — Z89511 Acquired absence of right leg below knee: Secondary | ICD-10-CM | POA: Diagnosis not present

## 2022-06-20 DIAGNOSIS — I2510 Atherosclerotic heart disease of native coronary artery without angina pectoris: Secondary | ICD-10-CM | POA: Diagnosis not present

## 2022-06-20 DIAGNOSIS — F1010 Alcohol abuse, uncomplicated: Secondary | ICD-10-CM | POA: Diagnosis not present

## 2022-06-20 DIAGNOSIS — Z794 Long term (current) use of insulin: Secondary | ICD-10-CM | POA: Diagnosis not present

## 2022-06-20 DIAGNOSIS — E871 Hypo-osmolality and hyponatremia: Secondary | ICD-10-CM | POA: Diagnosis present

## 2022-06-20 DIAGNOSIS — E872 Acidosis, unspecified: Secondary | ICD-10-CM | POA: Diagnosis not present

## 2022-06-20 LAB — LACTIC ACID, PLASMA
Lactic Acid, Venous: 1.1 mmol/L (ref 0.5–1.9)
Lactic Acid, Venous: 1.6 mmol/L (ref 0.5–1.9)

## 2022-06-20 LAB — OSMOLALITY, URINE: Osmolality, Ur: 297 mOsm/kg — ABNORMAL LOW (ref 300–900)

## 2022-06-20 LAB — SAMPLE TO BLOOD BANK

## 2022-06-20 LAB — HEMOGLOBIN A1C
Hgb A1c MFr Bld: 9.6 % — ABNORMAL HIGH (ref 4.8–5.6)
Mean Plasma Glucose: 228.82 mg/dL

## 2022-06-20 LAB — COMPREHENSIVE METABOLIC PANEL
ALT: 25 U/L (ref 0–44)
AST: 25 U/L (ref 15–41)
Albumin: 3.2 g/dL — ABNORMAL LOW (ref 3.5–5.0)
Alkaline Phosphatase: 80 U/L (ref 38–126)
Anion gap: 10 (ref 5–15)
BUN: 8 mg/dL (ref 8–23)
CO2: 22 mmol/L (ref 22–32)
Calcium: 8.6 mg/dL — ABNORMAL LOW (ref 8.9–10.3)
Chloride: 102 mmol/L (ref 98–111)
Creatinine, Ser: 0.75 mg/dL (ref 0.61–1.24)
GFR, Estimated: >60 mL/min (ref >60)
Glucose, Bld: 112 mg/dL — ABNORMAL HIGH (ref 70–99)
Potassium: 4.1 mmol/L (ref 3.5–5.1)
Sodium: 134 mmol/L — ABNORMAL LOW (ref 135–145)
Total Bilirubin: 0.7 mg/dL (ref 0.3–1.2)
Total Protein: 6.8 g/dL (ref 6.5–8.1)

## 2022-06-20 LAB — I-STAT VENOUS BLOOD GAS, ED
Acid-base deficit: 1 mmol/L (ref 0.0–2.0)
Bicarbonate: 24.3 mmol/L (ref 20.0–28.0)
Calcium, Ion: 1.17 mmol/L (ref 1.15–1.40)
HCT: 44 % (ref 39.0–52.0)
Hemoglobin: 15 g/dL (ref 13.0–17.0)
O2 Saturation: 68 %
Potassium: 4.2 mmol/L (ref 3.5–5.1)
Sodium: 137 mmol/L (ref 135–145)
TCO2: 26 mmol/L (ref 22–32)
pCO2, Ven: 42.6 mmHg — ABNORMAL LOW (ref 44–60)
pH, Ven: 7.364 (ref 7.25–7.43)
pO2, Ven: 37 mmHg (ref 32–45)

## 2022-06-20 LAB — CBG MONITORING, ED: Glucose-Capillary: 115 mg/dL — ABNORMAL HIGH (ref 70–99)

## 2022-06-20 LAB — PHOSPHORUS
Phosphorus: 3.9 mg/dL (ref 2.5–4.6)
Phosphorus: 3.7 mg/dL (ref 2.5–4.6)

## 2022-06-20 LAB — CBC
HCT: 42.9 % (ref 39.0–52.0)
Hemoglobin: 14.5 g/dL (ref 13.0–17.0)
MCH: 31.3 pg (ref 26.0–34.0)
MCHC: 33.8 g/dL (ref 30.0–36.0)
MCV: 92.7 fL (ref 80.0–100.0)
Platelets: 165 10*3/uL (ref 150–400)
RBC: 4.63 MIL/uL (ref 4.22–5.81)
RDW: 15.6 % — ABNORMAL HIGH (ref 11.5–15.5)
WBC: 4.9 10*3/uL (ref 4.0–10.5)
nRBC: 0 % (ref 0.0–0.2)

## 2022-06-20 LAB — TROPONIN I (HIGH SENSITIVITY)
Troponin I (High Sensitivity): 6 ng/L (ref <18)
Troponin I (High Sensitivity): 6 ng/L (ref <18)

## 2022-06-20 LAB — SODIUM, URINE, RANDOM: Sodium, Ur: 52 mmol/L

## 2022-06-20 LAB — GLUCOSE, CAPILLARY
Glucose-Capillary: 106 mg/dL — ABNORMAL HIGH (ref 70–99)
Glucose-Capillary: 127 mg/dL — ABNORMAL HIGH (ref 70–99)

## 2022-06-20 LAB — MAGNESIUM
Magnesium: 1.9 mg/dL (ref 1.7–2.4)
Magnesium: 2 mg/dL (ref 1.7–2.4)

## 2022-06-20 LAB — TSH: TSH: 2.569 u[IU]/mL (ref 0.350–4.500)

## 2022-06-20 LAB — PREALBUMIN: Prealbumin: 25 mg/dL (ref 18–38)

## 2022-06-20 LAB — AMMONIA: Ammonia: 15 umol/L (ref 9–35)

## 2022-06-20 LAB — CREATININE, URINE, RANDOM: Creatinine, Urine: 29 mg/dL

## 2022-06-20 LAB — OSMOLALITY: Osmolality: 288 mOsm/kg (ref 275–295)

## 2022-06-20 LAB — HIV ANTIBODY (ROUTINE TESTING W REFLEX): HIV Screen 4th Generation wRfx: NONREACTIVE

## 2022-06-20 LAB — CK: Total CK: 48 U/L — ABNORMAL LOW (ref 49–397)

## 2022-06-20 MED ORDER — GABAPENTIN 300 MG PO CAPS: 300.0000 mg | ORAL_CAPSULE | ORAL | Status: DC

## 2022-06-20 MED ORDER — METOPROLOL SUCCINATE ER 25 MG PO TB24
25.0000 mg | ORAL_TABLET | Freq: Every day | ORAL | 0 refills | Status: DC
  Filled 2022-06-20: qty 30, 30d supply, fill #0

## 2022-06-20 MED ORDER — ADULT MULTIVITAMIN W/MINERALS CH
1.0000 | ORAL_TABLET | Freq: Every day | ORAL | Status: DC
  Administered 2022-06-20: 1 via ORAL
  Filled 2022-06-20: qty 1

## 2022-06-20 MED ORDER — INSULIN GLARGINE-YFGN 100 UNIT/ML ~~LOC~~ SOLN
20.0000 [IU] | Freq: Every day | SUBCUTANEOUS | Status: DC
  Filled 2022-06-20: qty 0.2

## 2022-06-20 MED ORDER — THIAMINE HCL 100 MG/ML IJ SOLN
500.0000 mg | Freq: Every day | INTRAVENOUS | Status: DC
  Filled 2022-06-20: qty 5

## 2022-06-20 MED ORDER — ASPIRIN 81 MG PO TBEC
81.0000 mg | DELAYED_RELEASE_TABLET | Freq: Every day | ORAL | Status: DC
  Administered 2022-06-20: 81 mg via ORAL
  Filled 2022-06-20: qty 1

## 2022-06-20 MED ORDER — LORAZEPAM 1 MG PO TABS: 1.0000 mg | ORAL_TABLET | ORAL | Status: DC | PRN

## 2022-06-20 MED ORDER — ALBUTEROL SULFATE (2.5 MG/3ML) 0.083% IN NEBU: 2.5000 mg | INHALATION_SOLUTION | RESPIRATORY_TRACT | Status: DC | PRN

## 2022-06-20 MED ORDER — ACETAMINOPHEN 650 MG RE SUPP: 650.0000 mg | Freq: Four times a day (QID) | RECTAL | Status: DC | PRN

## 2022-06-20 MED ORDER — ONDANSETRON HCL 4 MG PO TABS: 4.0000 mg | ORAL_TABLET | Freq: Four times a day (QID) | ORAL | Status: DC | PRN

## 2022-06-20 MED ORDER — FUROSEMIDE 40 MG PO TABS: ORAL_TABLET | ORAL | 0 refills | Status: AC

## 2022-06-20 MED ORDER — GABAPENTIN 300 MG PO CAPS
300.0000 mg | ORAL_CAPSULE | Freq: Two times a day (BID) | ORAL | Status: DC
  Administered 2022-06-20: 300 mg via ORAL
  Filled 2022-06-20: qty 1

## 2022-06-20 MED ORDER — PANTOPRAZOLE SODIUM 40 MG PO TBEC
40.0000 mg | DELAYED_RELEASE_TABLET | Freq: Every morning | ORAL | Status: DC
  Administered 2022-06-20: 40 mg via ORAL
  Filled 2022-06-20: qty 1

## 2022-06-20 MED ORDER — THIAMINE MONONITRATE 100 MG PO TABS
500.0000 mg | ORAL_TABLET | Freq: Every day | ORAL | Status: DC
  Administered 2022-06-20: 500 mg via ORAL
  Filled 2022-06-20: qty 5

## 2022-06-20 MED ORDER — PANCRELIPASE (LIP-PROT-AMYL) 36000-114000 UNITS PO CPEP
36000.0000 [IU] | ORAL_CAPSULE | Freq: Three times a day (TID) | ORAL | Status: DC
  Administered 2022-06-20: 36000 [IU] via ORAL
  Filled 2022-06-20: qty 1

## 2022-06-20 MED ORDER — THIAMINE HCL 100 MG/ML IJ SOLN: 100.0000 mg | Freq: Every day | INTRAMUSCULAR | Status: DC

## 2022-06-20 MED ORDER — ATORVASTATIN CALCIUM 80 MG PO TABS: 80.0000 mg | ORAL_TABLET | Freq: Every day | ORAL | Status: DC

## 2022-06-20 MED ORDER — CLOPIDOGREL BISULFATE 75 MG PO TABS
75.0000 mg | ORAL_TABLET | Freq: Every day | ORAL | Status: DC
  Administered 2022-06-20: 75 mg via ORAL
  Filled 2022-06-20: qty 1

## 2022-06-20 MED ORDER — ENTRESTO 24-26 MG PO TABS
1.0000 | ORAL_TABLET | Freq: Two times a day (BID) | ORAL | 0 refills | Status: AC
  Filled 2022-06-20: qty 60, 30d supply, fill #0

## 2022-06-20 MED ORDER — HYDROCODONE-ACETAMINOPHEN 5-325 MG PO TABS: 1.0000 | ORAL_TABLET | ORAL | Status: DC | PRN

## 2022-06-20 MED ORDER — ACETAMINOPHEN 325 MG PO TABS: 650.0000 mg | ORAL_TABLET | Freq: Four times a day (QID) | ORAL | Status: DC | PRN

## 2022-06-20 MED ORDER — LURASIDONE HCL 40 MG PO TABS
40.0000 mg | ORAL_TABLET | Freq: Every evening | ORAL | Status: DC
  Filled 2022-06-20: qty 1

## 2022-06-20 MED ORDER — ACETAMINOPHEN 325 MG PO TABS: 650.0000 mg | ORAL_TABLET | Freq: Four times a day (QID) | ORAL | Status: AC | PRN

## 2022-06-20 MED ORDER — THIAMINE MONONITRATE 100 MG PO TABS: 100.0000 mg | ORAL_TABLET | Freq: Every day | ORAL | Status: DC

## 2022-06-20 MED ORDER — THIAMINE MONONITRATE 100 MG PO TABS: 500.0000 mg | ORAL_TABLET | Freq: Every day | ORAL | Status: DC

## 2022-06-20 MED ORDER — SODIUM CHLORIDE 0.9 % IV SOLN: INTRAVENOUS | Status: DC

## 2022-06-20 MED ORDER — ONDANSETRON HCL 4 MG/2ML IJ SOLN: 4.0000 mg | Freq: Four times a day (QID) | INTRAMUSCULAR | Status: DC | PRN

## 2022-06-20 MED ORDER — GABAPENTIN 300 MG PO CAPS: 600.0000 mg | ORAL_CAPSULE | Freq: Every day | ORAL | Status: DC

## 2022-06-20 MED ORDER — LORAZEPAM 2 MG/ML IJ SOLN: 1.0000 mg | INTRAMUSCULAR | Status: DC | PRN

## 2022-06-20 MED ORDER — RANOLAZINE ER 500 MG PO TB12
500.0000 mg | ORAL_TABLET | Freq: Every day | ORAL | Status: DC
  Administered 2022-06-20: 500 mg via ORAL
  Filled 2022-06-20: qty 1

## 2022-06-20 MED ORDER — NALTREXONE HCL 50 MG PO TABS
50.0000 mg | ORAL_TABLET | Freq: Every day | ORAL | Status: DC
  Filled 2022-06-20 (×2): qty 1

## 2022-06-20 MED ORDER — FOLIC ACID 1 MG PO TABS
1.0000 mg | ORAL_TABLET | Freq: Every day | ORAL | Status: DC
  Administered 2022-06-20: 1 mg via ORAL
  Filled 2022-06-20: qty 1

## 2022-06-20 MED ORDER — EZETIMIBE 10 MG PO TABS
10.0000 mg | ORAL_TABLET | Freq: Every day | ORAL | Status: DC
  Administered 2022-06-20: 10 mg via ORAL
  Filled 2022-06-20: qty 1

## 2022-06-20 MED ORDER — THIAMINE HCL 100 MG/ML IJ SOLN: 500.0000 mg | Freq: Every day | INTRAMUSCULAR | Status: DC

## 2022-06-20 MED ORDER — PREGABALIN 75 MG PO CAPS
150.0000 mg | ORAL_CAPSULE | Freq: Two times a day (BID) | ORAL | Status: DC
  Administered 2022-06-20: 150 mg via ORAL
  Filled 2022-06-20: qty 2

## 2022-06-20 NOTE — Assessment & Plan Note (Signed)
In the setting of hypotension responded well to IVF  Gently rehydrate  Obtain echo  Cardiac enzymes  At this time does not seem to be interested in quiitng drinking but is working on decreasing tobacco abuse

## 2022-06-20 NOTE — Assessment & Plan Note (Signed)
Chronic stable secondary to PAD

## 2022-06-20 NOTE — Assessment & Plan Note (Signed)
Order sliding scale Hold Jardiance for Resume Lantus but decrease down to 20 units

## 2022-06-20 NOTE — Assessment & Plan Note (Signed)
-   Spoke about importance of quitting spent 5 minutes discussing options for treatment, prior attempts at quitting, and dangers of smoking  -At this point patient is     interested in quitting  - order nicotine patch   - nursing tobacco cessation protocol  

## 2022-06-20 NOTE — Progress Notes (Signed)
CSW received referral regarding substance use resources. CSW spoke with patient and spouse regarding ETOH use as requested. Patient states that he would be accepting of community resources for him to review. CSW provided resources including those through the [name]. No further questions at this time.  Joaquin Courts, MSW, [facility]

## 2022-06-20 NOTE — Assessment & Plan Note (Signed)
Current appears to be slightly on the dry side and hypotensive gently rehydrate hold home medications for CHF for tonight and continue to monitor fluid status

## 2022-06-20 NOTE — Assessment & Plan Note (Signed)
Hypertension allow permissive hypertension for today

## 2022-06-20 NOTE — Discharge Summary (Signed)
Physician Discharge Summary  Ricardo Howard ZOX:[id number] DOB: 1960-08-23 DOA: 06/19/2022  PCP: [facility]  Admit date: 06/19/2022 Discharge date: 06/20/2022  Admitted From: (Home) Disposition:  (Home )  Recommendations for Outpatient Follow-up:  Follow up with PCP in 1-2 weeks Please obtain BMP/CBC in one week Please continue counseling about alcohol abuse   Discharge Condition: (Stable) CODE STATUS: (FULL) Diet recommendation: Heart Healthy / Carb Modified   Brief/Interim Summary:  Ricardo Howard is a 62 y.o. male with medical history significant of  CAD, DM2, CHF, alcohol abuse, tobacco abuse, chronic hypoxic respiratory failure 3 L, PTSD, peripheral vascular disease s/p right BKA, obstructive sleep apnea on CPAP, he presented to ED due to fall, while he is still intoxicated, he does report some lightheadedness, his blood pressure was noted to be soft while in ED 88/58, so he was admitted for further workup.  Fall -Is most likely due to soft blood pressure while intoxicated with alcohol, no evidence of infection, antihypertensive medication has been held, he was seen by OT this morning, he is back to baseline  Orthostatsis -This is most likely due to multiple cardiac medications, with heavy alcohol consumption without very poor oral intake, cardiac meds has been held overnight, he was kept on gentle hydration, this has resolved this morning  Lactic acidosis -Due to above, no evidence of infection  Diabetes mellitus, type II -Continue home medications  Alcohol abuse -no evidence of alcohol withdrawal during hospital stay, he was counseled, seen by [facility]   Hypertension Chronic systolic/diastolic CHF -no evidence of volume overload, initially blood pressure soft/orthostatic, this has resolved this morning -His cardiac medication has been adjusted, where he is instructed to resume his cardiac meds gradually, and medication has been lowered including Toprol-XL,  Entresto.  Hyponatremia -Improving   Discharge Diagnoses:  Principal Problem:   Lactic acidosis Active Problems:   Diabetes mellitus with diabetic neuropathy, with long-term current use of insulin (HCC)   Mixed hyperlipidemia due to type 2 diabetes mellitus (HCC)   Alcohol abuse   Essential hypertension   Tobacco abuse   Chronic combined systolic and diastolic CHF (congestive heart failure) (HCC)   S/P BKA (below knee amputation) (HCC)   Hyponatremia   Postural dizziness with presyncope    Discharge Instructions  Discharge Instructions     Diet - low sodium heart healthy   Complete by: As directed    Discharge instructions   Complete by: As directed    Follow with Primary [facility] in 7 days   Get CBC, CMP,  checked  by Primary MD next visit.    Activity: As tolerated with Full fall precautions use walker/cane & assistance as needed   Disposition Home    Diet: Heart Healthy .   On your next visit with your primary care physician please Get Medicines reviewed and adjusted.   Please request your Prim.MD to go over all Hospital Tests and Procedure/Radiological results at the follow up, please get all Hospital records sent to your Prim MD by signing hospital release before you go home.   If you experience worsening of your admission symptoms, develop shortness of breath, life threatening emergency, suicidal or homicidal thoughts you must seek medical attention immediately by calling 911 or calling your MD immediately  if symptoms less severe.  You Must read complete instructions/literature along with all the possible adverse reactions/side effects for all the Medicines you take and that have been prescribed to you. Take any new Medicines after  you have completely understood and accpet all the possible adverse reactions/side effects.   Do not drive, operating heavy machinery, perform activities at heights, swimming or participation in water activities  or provide baby sitting services if your were admitted for syncope or siezures until you have seen by Primary MD or a Neurologist and advised to do so again.  Do not drive when taking Pain medications.    Do not take more than prescribed Pain, Sleep and Anxiety Medications  Special Instructions: If you have smoked or chewed Tobacco  in the last 2 yrs please stop smoking, stop any regular Alcohol  and or any Recreational drug use.  Wear Seat belts while driving.   Please note  You were cared for by a hospitalist during your hospital stay. If you have any questions about your discharge medications or the care you received while you were in the hospital after you are discharged, you can call the unit and asked to speak with the hospitalist on call if the hospitalist that took care of you is not available. Once you are discharged, your primary care physician will handle any further medical issues. Please note that NO REFILLS for any discharge medications will be authorized once you are discharged, as it is imperative that you return to your primary care physician (or establish a relationship with a primary care physician if you do not have one) for your aftercare needs so that they can reassess your need for medications and monitor your lab values.   Increase activity slowly   Complete by: As directed       Allergies as of 06/20/2022       Reactions   Iohexol Hives   Patient broke out in hives after injection of Omni 300, will need 13 hour pre-med in future   Ozempic (0.25 Or 0.5 Mg-dose) [semaglutide(0.25 Or 0.5mg -dos)] Other (See Comments)   pancreatitis   Shellfish Allergy Hives   Milk (cow) Diarrhea   Finding of gastrointestinal tract gas   Zestril [lisinopril] Cough   Zocor [simvastatin] Itching, Cough        Medication List     STOP taking these medications    buPROPion 150 MG 12 hr tablet Commonly known as: WELLBUTRIN SR   hydrOXYzine 25 MG tablet Commonly known as:  ATARAX   pregabalin 150 MG capsule Commonly known as: LYRICA   sacubitril-valsartan 49-51 MG Commonly known as: ENTRESTO Replaced by: Entresto 24-26 MG   sertraline 100 MG tablet Commonly known as: ZOLOFT   tamsulosin 0.4 MG Caps capsule Commonly known as: FLOMAX       TAKE these medications    acetaminophen 325 MG tablet Commonly known as: TYLENOL Take 2 tablets (650 mg total) by mouth every 6 (six) hours as needed for mild pain (or Fever >/= 101). What changed:  medication strength how much to take when to take this reasons to take this   aspirin EC 81 MG tablet Take 81 mg by mouth in the morning.   atorvastatin 80 MG tablet Commonly known as: LIPITOR Take 1 tablet (80 mg total) by mouth at bedtime.   clopidogrel 75 MG tablet Commonly known as: PLAVIX Take 75 mg by mouth daily.   cyanocobalamin 500 MCG tablet Commonly known as: VITAMIN B12 Take 500 mcg by mouth in the morning.   cyclobenzaprine 10 MG tablet Commonly known as: FLEXERIL Take 10 mg by mouth 3 (three) times daily as needed for muscle spasms.   dicyclomine 20 MG tablet Commonly known  as: BENTYL Take 20 mg by mouth in the morning and at bedtime.   docusate sodium 100 MG capsule Commonly known as: COLACE Take 100 mg by mouth daily as needed for mild constipation.   empagliflozin 25 MG Tabs tablet Commonly known as: JARDIANCE Take 1 tablet (25 mg total) by mouth daily. What changed: how much to take   Entresto 24-26 MG Generic drug: sacubitril-valsartan Take 1 tablet by mouth 2 (two) times daily. Start taking on: Jun 25, 2022 Replaces: sacubitril-valsartan 49-51 MG   ezetimibe 10 MG tablet Commonly known as: ZETIA Take 10 mg by mouth daily.   folic acid 1 MG tablet Commonly known as: FOLVITE Take 1 tablet (1 mg total) by mouth daily.   furosemide 40 MG tablet Commonly known as: LASIX Take 1 tablet (40 mg total) by mouth daily. Start taking on: Jun 23, 2022 What changed:  See the  new instructions. These instructions start on Jun 23, 2022. If you are unsure what to do until then, ask your doctor or other care provider.   gabapentin 300 MG capsule Commonly known as: NEURONTIN Take 300-600 mg by mouth See admin instructions. Take 1 capsule (300 mg) by mouth in the morning, take 1 capsule (300 mg) by mouth at lunch, & take 2 capsules (600 mg) by mouth at bedtime   insulin aspart 100 UNIT/ML injection Commonly known as: novoLOG Inject 10 Units into the skin 3 (three) times daily with meals. What changed:  how much to take additional instructions   insulin glargine 100 UNIT/ML Solostar Pen Commonly known as: LANTUS 30units in am and 35unit at night  Further adjustment per your pcp and endocrinology Goal of a1c is less than 7%, currently your a1c is 10%, not at goal What changed:  how much to take how to take this when to take this additional instructions   Insulin Pen Needle 30G X 8 MM Misc Commonly known as: NOVOFINE Inject 10 each into the skin as needed.   lipase/protease/amylase 16109 UNITS Cpep capsule Commonly known as: CREON Take 1 capsule (36,000 Units total) by mouth 3 (three) times daily before meals.   lurasidone 40 MG Tabs tablet Commonly known as: LATUDA Take 40 mg by mouth every evening.   magnesium oxide 400 (241.3 Mg) MG tablet Commonly known as: MAG-OX Take 1 tablet (400 mg total) by mouth 2 (two) times daily.   metoprolol succinate 25 MG 24 hr tablet Commonly known as: Toprol XL Take 1 tablet (25 mg total) by mouth daily. Start taking on: Jun 22, 2022 What changed:  medication strength how much to take when to take this These instructions start on Jun 22, 2022. If you are unsure what to do until then, ask your doctor or other care provider.   naloxone 4 MG/0.1ML Liqd nasal spray kit Commonly known as: NARCAN Place 1 spray into the nose as needed (opioid reversal).   naltrexone 50 MG tablet Commonly known as: DEPADE Take 50 mg  by mouth daily.   nitroGLYCERIN 0.4 MG SL tablet Commonly known as: NITROSTAT Place 1 tablet (0.4 mg total) under the tongue every 5 (five) minutes as needed for chest pain.   ondansetron 4 MG disintegrating tablet Commonly known as: ZOFRAN-ODT Take 4 mg by mouth every 6 (six) hours as needed for nausea.   pantoprazole 40 MG tablet Commonly known as: PROTONIX Take 40 mg by mouth in the morning.   polyethylene glycol 17 g packet Commonly known as: MIRALAX / GLYCOLAX Take 17 g  by mouth daily as needed for mild constipation.   potassium chloride SA 20 MEQ tablet Commonly known as: KLOR-CON M Take 1 tablet (20 mEq total) by mouth daily. What changed: when to take this   Quintabs Tabs Take 1 tablet by mouth daily.   ranolazine 500 MG 12 hr tablet Commonly known as: RANEXA Take 1 tablet by mouth daily at 6 (six) AM.   thiamine 100 MG tablet Commonly known as: VITAMIN B1 Take 1 tablet (100 mg total) by mouth daily.        Allergies  Allergen Reactions   Iohexol Hives    Patient broke out in hives after injection of Omni 300, will need 13 hour pre-med in future   Ozempic (0.25 Or 0.5 Mg-Dose) [Semaglutide(0.25 Or 0.5mg -Dos)] Other (See Comments)    pancreatitis   Shellfish Allergy Hives   Milk (Cow) Diarrhea    Finding of gastrointestinal tract gas   Zestril [Lisinopril] Cough   Zocor [Simvastatin] Itching and Cough    Consultations: none   Procedures/Studies: DG Chest Port 1 View  Addendum Date: 06/19/2022   ADDENDUM REPORT: 06/19/2022 23:20 FINDINGS: There is no evidence for focal lung infiltrate, pleural effusion or pneumothorax. The cardiomediastinal silhouette is within normal limits. No acute fractures are seen. Strandy opacities in the lung bases are favored is atelectasis or scarring and unchanged from prior. IMPRESSION: No acute cardiopulmonary process. Electronically Signed   By: Darliss Cheney M.D.   On: 06/19/2022 23:20   Result Date: 06/19/2022 CLINICAL  DATA:  Trauma EXAM: PORTABLE CHEST 1 VIEW COMPARISON:  Chest x-ray 06/04/2022 FINDINGS: The heart size and mediastinal contours are within normal limits. There some strandy opacities in both lung bases. Pleural effusion or pneumothorax. The visualized skeletal structures are unremarkable. IMPRESSION: Strandy opacities in both lung bases, likely atelectasis.  A Electronically Signed: By: Darliss Cheney M.D. On: 06/19/2022 17:52   CT HEAD WO CONTRAST  Result Date: 06/19/2022 CLINICAL DATA:  Head trauma, moderate-severe; Polytrauma, blunt. Fall. Dizziness. On Plavix. EXAM: CT HEAD WITHOUT CONTRAST CT CERVICAL SPINE WITHOUT CONTRAST TECHNIQUE: Multidetector CT imaging of the head and cervical spine was performed following the standard protocol without intravenous contrast. Multiplanar CT image reconstructions of the cervical spine were also generated. RADIATION DOSE REDUCTION: This exam was performed according to the departmental dose-optimization program which includes automated exposure control, adjustment of the mA and/or kV according to patient size and/or use of iterative reconstruction technique. COMPARISON:  Head CT 06/04/2022 FINDINGS: CT HEAD FINDINGS Brain: There is no evidence of an acute infarct, intracranial hemorrhage, mass, midline shift, or extra-axial fluid collection. Patchy hypodensities in the cerebral white matter bilaterally are unchanged and nonspecific but compatible with moderate chronic small vessel ischemic disease. Chronic lacunar infarcts are again seen in the right thalamus and left cerebellar hemisphere. The ventricles and sulci are within normal limits for age. Vascular: Calcified atherosclerosis at the skull base. No hyperdense vessel. Skull: No acute fracture or suspicious osseous lesion. Sinuses/Orbits: Mild mucosal thickening in the paranasal sinuses. Clear mastoid air cells. Unremarkable orbits. Other: None. CT CERVICAL SPINE FINDINGS Alignment: Mild straightening of the normal  cervical lordosis. Trace anterolisthesis of C4 on C5, likely degenerative. Skull base and vertebrae: No acute fracture or suspicious osseous lesion. Soft tissues and spinal canal: No prevertebral fluid or swelling. No visible canal hematoma. Disc levels: Mild multilevel disc degeneration. Severe facet arthrosis on the left at C3-4 and on the right at C4-5. Upper chest: No mass or consolidation in the included  lung apices. Other: Mild calcific atherosclerosis at the carotid bifurcations. IMPRESSION: 1. No evidence of acute intracranial abnormality. 2. Moderate chronic small vessel ischemic disease. 3. No acute cervical spine fracture. Electronically Signed   By: Sebastian Ache M.D.   On: 06/19/2022 20:05   CT CERVICAL SPINE WO CONTRAST  Result Date: 06/19/2022 CLINICAL DATA:  Head trauma, moderate-severe; Polytrauma, blunt. Fall. Dizziness. On Plavix. EXAM: CT HEAD WITHOUT CONTRAST CT CERVICAL SPINE WITHOUT CONTRAST TECHNIQUE: Multidetector CT imaging of the head and cervical spine was performed following the standard protocol without intravenous contrast. Multiplanar CT image reconstructions of the cervical spine were also generated. RADIATION DOSE REDUCTION: This exam was performed according to the departmental dose-optimization program which includes automated exposure control, adjustment of the mA and/or kV according to patient size and/or use of iterative reconstruction technique. COMPARISON:  Head CT 06/04/2022 FINDINGS: CT HEAD FINDINGS Brain: There is no evidence of an acute infarct, intracranial hemorrhage, mass, midline shift, or extra-axial fluid collection. Patchy hypodensities in the cerebral white matter bilaterally are unchanged and nonspecific but compatible with moderate chronic small vessel ischemic disease. Chronic lacunar infarcts are again seen in the right thalamus and left cerebellar hemisphere. The ventricles and sulci are within normal limits for age. Vascular: Calcified atherosclerosis at  the skull base. No hyperdense vessel. Skull: No acute fracture or suspicious osseous lesion. Sinuses/Orbits: Mild mucosal thickening in the paranasal sinuses. Clear mastoid air cells. Unremarkable orbits. Other: None. CT CERVICAL SPINE FINDINGS Alignment: Mild straightening of the normal cervical lordosis. Trace anterolisthesis of C4 on C5, likely degenerative. Skull base and vertebrae: No acute fracture or suspicious osseous lesion. Soft tissues and spinal canal: No prevertebral fluid or swelling. No visible canal hematoma. Disc levels: Mild multilevel disc degeneration. Severe facet arthrosis on the left at C3-4 and on the right at C4-5. Upper chest: No mass or consolidation in the included lung apices. Other: Mild calcific atherosclerosis at the carotid bifurcations. IMPRESSION: 1. No evidence of acute intracranial abnormality. 2. Moderate chronic small vessel ischemic disease. 3. No acute cervical spine fracture. Electronically Signed   By: Sebastian Ache M.D.   On: 06/19/2022 20:05   DG Pelvis Portable  Result Date: 06/19/2022 CLINICAL DATA:  Trauma EXAM: PORTABLE PELVIS 1-2 VIEWS COMPARISON:  None Available. FINDINGS: There is no evidence of pelvic fracture or diastasis. No pelvic bone lesions are seen. There are mild moderate degenerative changes of both hips. Peripheral vascular calcifications are present. IMPRESSION: 1. No acute fracture or dislocation. 2. Mild-to-moderate degenerative changes of both hips. Electronically Signed   By: Darliss Cheney M.D.   On: 06/19/2022 17:51      Subjective:  No significant events overnight, he denies any complaints today, eager to go home, discussed with patient and wife at bedside Discharge Exam: Vitals:   06/20/22 0858 06/20/22 0901  BP: 111/82 123/85  Pulse:  87  Resp: (!) 22 11  Temp:    SpO2: 97%    Vitals:   06/20/22 0852 06/20/22 0855 06/20/22 0858 06/20/22 0901  BP: (!) 144/94 (!) 128/91 111/82 123/85  Pulse: 81 90  87  Resp: 14 19 (!) 22 11   Temp:      TempSrc:      SpO2: 95%  97%   Weight:      Height:        General: Pt is alert, awake, not in acute distress Cardiovascular: RRR, S1/S2 +, no rubs, no gallops Respiratory: CTA bilaterally, no wheezing, no rhonchi Abdominal: Soft, NT,  ND, bowel sounds + Extremities: no edema, no cyanosis, right BKA    The results of significant diagnostics from this hospitalization (including imaging, microbiology, ancillary and laboratory) are listed below for reference.     Microbiology: No results found for this or any previous visit (from the past 240 hour(s)).   Labs: BNP (last 3 results) Recent Labs    07/16/21 1037  BNP 56.4   Basic Metabolic Panel: Recent Labs  Lab 06/19/22 1725 06/19/22 1736 06/20/22 0040 06/20/22 0047 06/20/22 0241  NA 130* 131*  --  137 134*  K 4.0 3.7  --  4.2 4.1  CL 99 100  --   --  102  CO2 14*  --   --   --  22  GLUCOSE 126* 130*  --   --  112*  BUN 15 15  --   --  8  CREATININE 1.07 1.00  --   --  0.75  CALCIUM 8.9  --   --   --  8.6*  MG  --   --  1.9  --  2.0  PHOS  --   --  3.9  --  3.7   Liver Function Tests: Recent Labs  Lab 06/19/22 1725 06/20/22 0241  AST 22 25  ALT 25 25  ALKPHOS 81 80  BILITOT 0.8 0.7  PROT 7.0 6.8  ALBUMIN 3.2* 3.2*   No results for input(s): "LIPASE", "AMYLASE" in the last 168 hours. Recent Labs  Lab 06/20/22 0040  AMMONIA 15   CBC: Recent Labs  Lab 06/19/22 1725 06/19/22 1736 06/20/22 0047 06/20/22 0241  WBC 6.4  --   --  4.9  HGB 14.7 16.3 15.0 14.5  HCT 43.9 48.0 44.0 42.9  MCV 94.8  --   --  92.7  PLT 158  --   --  165   Cardiac Enzymes: Recent Labs  Lab 06/20/22 0040  CKTOTAL 48*   BNP: Invalid input(s): "POCBNP" CBG: Recent Labs  Lab 06/20/22 0039 06/20/22 0421 06/20/22 0816  GLUCAP 115* 106* 127*   D-Dimer No results for input(s): "DDIMER" in the last 72 hours. Hgb A1c Recent Labs    06/20/22 0040  HGBA1C 9.6*   Lipid Profile No results for input(s):  "CHOL", "HDL", "LDLCALC", "TRIG", "CHOLHDL", "LDLDIRECT" in the last 72 hours. Thyroid function studies Recent Labs    06/20/22 0040  TSH 2.569   Anemia work up No results for input(s): "VITAMINB12", "FOLATE", "FERRITIN", "TIBC", "IRON", "RETICCTPCT" in the last 72 hours. Urinalysis    Component Value Date/Time   COLORURINE YELLOW 06/19/2022 1730   APPEARANCEUR CLEAR 06/19/2022 1730   LABSPEC 1.007 06/19/2022 1730   PHURINE 5.0 06/19/2022 1730   GLUCOSEU >=500 (A) 06/19/2022 1730   HGBUR NEGATIVE 06/19/2022 1730   BILIRUBINUR NEGATIVE 06/19/2022 1730   KETONESUR NEGATIVE 06/19/2022 1730   PROTEINUR NEGATIVE 06/19/2022 1730   NITRITE NEGATIVE 06/19/2022 1730   LEUKOCYTESUR NEGATIVE 06/19/2022 1730   Sepsis Labs Recent Labs  Lab 06/19/22 1725 06/20/22 0241  WBC 6.4 4.9   Microbiology No results found for this or any previous visit (from the past 240 hour(s)).   Time coordinating discharge: Over 30 minutes  SIGNED:   Huey Bienenstock, MD  [facility] 06/20/2022, 11:21 AM Pager   If 7PM-7AM, please contact night-coverage [email or web address]

## 2022-06-20 NOTE — Progress Notes (Signed)
PT Cancellation Note  Patient Details Name: Ricardo Howard MRN: [id number] DOB: Jun 22, 1960   Cancelled Treatment:    Reason Eval/Treat Not Completed: PT screened, no needs identified, will sign off. Per conversation with OT, pt at his mobility baseline, no acute PT needs at this time, will sign off. Thank you.    Leonie Man 06/20/2022, 11:02 AM

## 2022-06-20 NOTE — ED Notes (Signed)
ED TO INPATIENT HANDOFF REPORT  ED Nurse Name and Phone #: Grant Fontana PM [phone]   S Name/Age/Gender Ricardo Howard 62 y.o. male Room/Bed: 006C/006C  Code Status   Code Status: Full Code  Home/SNF/Other Home Patient oriented to: self, place, time, and situation Is this baseline? Yes   Triage Complete: Triage complete  Chief Complaint Lactic acidosis [E87.20]  Triage Note Pt arrived via [facility] for level 2, fall on thinners (Plavix) from home. Unwitnessed fall, pt reports dizziness prior to fall, +LOC. Pt was assisted to recliner after fall, able to answer questions appropriately, endorses neck pain, generalized headache. Pt/family on scene endorses chronic alcoholism with ETOH use today. Pt was hypotensive with EMS, 98/62 -> 88/58, 700cc NS administered via 20g RFA.   PTA EMS Vitals    Allergies Allergies  Allergen Reactions   Iohexol Hives    Patient broke out in hives after injection of Omni 300, will need 13 hour pre-med in future   Ozempic (0.25 Or 0.5 Mg-Dose) [Semaglutide(0.25 Or 0.5mg -Dos)] Other (See Comments)    pancreatitis   Shellfish Allergy Hives   Milk (Cow) Diarrhea    Finding of gastrointestinal tract gas   Zestril [Lisinopril] Cough   Zocor [Simvastatin] Itching and Cough    Level of Care/Admitting Diagnosis ED Disposition     ED Disposition  Admit   Condition  --   Comment  Hospital Area: [facility] [[id number]]  Level of Care: Progressive [102]  Admit to Progressive based on following criteria: MULTISYSTEM THREATS such as stable sepsis, metabolic/electrolyte imbalance with or without encephalopathy that is responding to early treatment.  May place patient in observation at [facility] or [facility] if equivalent level of care is available:: No  Covid Evaluation: Asymptomatic - no recent exposure (last 10 days) testing not required  Diagnosis: Lactic acidosis [454098]  Admitting Physician: Therisa Doyne [[id number]]   Attending Physician: Therisa Doyne [[id number]]          B Medical/Surgery History Past Medical History:  Diagnosis Date   Alcohol abuse 06/14/2019   Anxiety    Back pain    Cataracts, bilateral    CHF (congestive heart failure) (HCC)    Depression    Diabetic retinopathy (HCC)    Essential hypertension 06/14/2019   Hepatitis C    cured   Hx of BKA, right (HCC)    Hyperlipidemia    Hypertension    Mixed hyperlipidemia due to type 2 diabetes mellitus (HCC) 07/31/2019   Nicotine dependence, cigarettes, uncomplicated 07/31/2019   PAD (peripheral artery disease) (HCC)    Pancreatitis    PTSD (post-traumatic stress disorder)    Uncontrolled type 2 diabetes mellitus with diabetic polyneuropathy, with long-term current use of insulin 07/31/2019   Past Surgical History:  Procedure Laterality Date   ABDOMINAL AORTOGRAM W/LOWER EXTREMITY N/A 12/02/2020   Procedure: ABDOMINAL AORTOGRAM W/LOWER EXTREMITY;  Surgeon: Leonie Douglas, MD;  Location: [facility];  Service: Cardiovascular;  Laterality: N/A;   AMPUTATION Right 03/13/2020   Procedure: TRANSMETATARSAL AMPUTATION;  Surgeon: Chuck Hint, MD;  Location[facility] OR;  Service: Vascular;  Laterality: Right;   AMPUTATION Right 03/15/2020   Procedure: RIGHT BELOW KNEE AMPUTATION;  Surgeon: Chuck Hint, MD;  Location: [facility] OR;  Service: Vascular;  Laterality: Right;   CORONARY ANGIOPLASTY WITH STENT PLACEMENT     CORONARY STENT INTERVENTION N/A 07/16/2021   Procedure: CORONARY STENT INTERVENTION;  Surgeon: Orbie Pyo, MD;  Location: [facility];  Service: Cardiovascular;  Laterality: N/A;   LEFT HEART CATH AND CORONARY ANGIOGRAPHY N/A 07/16/2021   Procedure: LEFT HEART CATH AND CORONARY ANGIOGRAPHY;  Surgeon: Orbie Pyo, MD;  Location: [facility];  Service: Cardiovascular;  Laterality: N/A;   PERIPHERAL VASCULAR INTERVENTION  12/02/2020   Procedure: PERIPHERAL VASCULAR INTERVENTION;   Surgeon: Leonie Douglas, MD;  Location: [facility];  Service: Cardiovascular;;  Haskell Flirt, LEIA     A IV Location/Drains/Wounds Patient Lines/Drains/Airways Status     Active Line/Drains/Airways     Name Placement date Placement time Site Days   Peripheral IV 06/19/22 18 G Anterior;Distal;Right;Upper Arm 06/19/22  1728  Arm  1   Peripheral IV 06/19/22 20 G Anterior;Left Forearm 06/19/22  1749  Forearm  1            Intake/Output Last 24 hours  Intake/Output Summary (Last 24 hours) at 06/20/2022 0113 Last data filed at 06/19/2022 1849 Gross per 24 hour  Intake 999 ml  Output --  Net 999 ml    Labs/Imaging Results for orders placed or performed during the hospital encounter of 06/19/22 (from the past 48 hour(s))  Comprehensive metabolic panel     Status: Abnormal   Collection Time: 06/19/22  5:25 PM  Result Value Ref Range   Sodium 130 (L) 135 - 145 mmol/L   Potassium 4.0 3.5 - 5.1 mmol/L    Comment: HEMOLYSIS AT THIS LEVEL MAY AFFECT RESULT   Chloride 99 98 - 111 mmol/L   CO2 14 (L) 22 - 32 mmol/L   Glucose, Bld 126 (H) 70 - 99 mg/dL    Comment: Glucose reference range applies only to samples taken after fasting for at least 8 hours.   BUN 15 8 - 23 mg/dL   Creatinine, Ser 1.61 0.61 - 1.24 mg/dL   Calcium 8.9 8.9 - 09.6 mg/dL   Total Protein 7.0 6.5 - 8.1 g/dL   Albumin 3.2 (L) 3.5 - 5.0 g/dL   AST 22 15 - 41 U/L    Comment: HEMOLYSIS AT THIS LEVEL MAY AFFECT RESULT   ALT 25 0 - 44 U/L    Comment: HEMOLYSIS AT THIS LEVEL MAY AFFECT RESULT   Alkaline Phosphatase 81 38 - 126 U/L   Total Bilirubin 0.8 0.3 - 1.2 mg/dL    Comment: HEMOLYSIS AT THIS LEVEL MAY AFFECT RESULT   GFR, Estimated >60 >60 mL/min    Comment: (NOTE) Calculated using the CKD-EPI Creatinine Equation (2021)    Anion gap 17 (H) 5 - 15    Comment: Performed at [facility], [address] [place], [place] [address]  CBC     Status: Abnormal   Collection Time: 06/19/22  5:25 PM   Result Value Ref Range   WBC 6.4 4.0 - 10.5 K/uL   RBC 4.63 4.22 - 5.81 MIL/uL   Hemoglobin 14.7 13.0 - 17.0 g/dL   HCT 98.1 19.1 - 47.8 %   MCV 94.8 80.0 - 100.0 fL   MCH 31.7 26.0 - 34.0 pg   MCHC 33.5 30.0 - 36.0 g/dL   RDW 29.5 (H) 62.1 - 30.8 %   Platelets 158 150 - 400 K/uL   nRBC 0.0 0.0 - 0.2 %    Comment: Performed at [facility], [address], [place], [place] [address]  Ethanol     Status: Abnormal   Collection Time: 06/19/22  5:25 PM  Result Value Ref Range   Alcohol, Ethyl (B) 92 (  H) <10 mg/dL    Comment: (NOTE) Lowest detectable limit for serum alcohol is 10 mg/dL.  For medical purposes only. Performed at [facility], [address], [place], [place] [address]   Lactic acid, plasma     Status: Abnormal   Collection Time: 06/19/22  5:25 PM  Result Value Ref Range   Lactic Acid, Venous 4.6 (HH) 0.5 - 1.9 mmol/L    Comment: CRITICAL RESULT CALLED TO, READ BACK BY AND VERIFIED WITH T.SOUTHARD,RN @1837  06/19/2022 VANG.J Performed at [facility], [address], [place], [place] [address]   Protime-INR     Status: None   Collection Time: 06/19/22  5:25 PM  Result Value Ref Range   Prothrombin Time 14.5 11.4 - 15.2 seconds   INR 1.1 0.8 - 1.2    Comment: (NOTE) INR goal varies based on device and disease states. Performed at [facility], [address] [place], [place] [address]   Urinalysis, Routine w reflex microscopic -Urine, Clean Catch     Status: Abnormal   Collection Time: 06/19/22  5:30 PM  Result Value Ref Range   Color, Urine YELLOW YELLOW   APPearance CLEAR CLEAR   Specific Gravity, Urine 1.007 1.005 - 1.030   pH 5.0 5.0 - 8.0   Glucose, UA >=500 (A) NEGATIVE mg/dL   Hgb urine dipstick NEGATIVE NEGATIVE   Bilirubin Urine NEGATIVE NEGATIVE   Ketones, ur NEGATIVE NEGATIVE mg/dL   Protein, ur NEGATIVE NEGATIVE mg/dL   Nitrite NEGATIVE NEGATIVE   Leukocytes,Ua NEGATIVE NEGATIVE   RBC / HPF 0-5 0 - 5 RBC/hpf   WBC,  UA 0-5 0 - 5 WBC/hpf   Bacteria, UA RARE (A) NONE SEEN   Squamous Epithelial / HPF 0-5 0 - 5 /HPF    Comment: Performed at [facility], [address] [place], [place] [address]  I-Stat Chem 8, ED     Status: Abnormal   Collection Time: 06/19/22  5:36 PM  Result Value Ref Range   Sodium 131 (L) 135 - 145 mmol/L   Potassium 3.7 3.5 - 5.1 mmol/L   Chloride 100 98 - 111 mmol/L   BUN 15 8 - 23 mg/dL   Creatinine, Ser 2.95 0.61 - 1.24 mg/dL   Glucose, Bld 621 (H) 70 - 99 mg/dL    Comment: Glucose reference range applies only to samples taken after fasting for at least 8 hours.   Calcium, Ion 1.12 (L) 1.15 - 1.40 mmol/L   TCO2 16 (L) 22 - 32 mmol/L   Hemoglobin 16.3 13.0 - 17.0 g/dL   HCT 30.8 65.7 - 84.6 %  CBG monitoring, ED     Status: Abnormal   Collection Time: 06/20/22 12:39 AM  Result Value Ref Range   Glucose-Capillary 115 (H) 70 - 99 mg/dL    Comment: Glucose reference range applies only to samples taken after fasting for at least 8 hours.  Hemoglobin A1c     Status: Abnormal   Collection Time: 06/20/22 12:40 AM  Result Value Ref Range   Hgb A1c MFr Bld 9.6 (H) 4.8 - 5.6 %    Comment: (NOTE) Pre diabetes:          5.7%-6.4%  Diabetes:              >6.4%  Glycemic control for   <7.0% adults with diabetes    Mean Plasma Glucose 228.82 mg/dL    Comment: Performed at [facility], [address] [place], [place] [address]  I-Stat venous blood gas, [facility] ED, [facility], [facility])     Status: Abnormal   Collection Time: 06/20/22 12:47 AM  Result Value Ref Range   pH, Ven 7.364 7.25 - 7.43   pCO2, Ven 42.6 (L) 44 - 60 mmHg   pO2, Ven 37 32 - 45 mmHg   Bicarbonate 24.3 20.0 - 28.0 mmol/L   TCO2 26 22 - 32 mmol/L   O2 Saturation 68 %   Acid-base deficit 1.0 0.0 - 2.0 mmol/L   Sodium 137 135 - 145 mmol/L   Potassium 4.2 3.5 - 5.1 mmol/L   Calcium, Ion 1.17 1.15 - 1.40 mmol/L   HCT 44.0 39.0 - 52.0 %   Hemoglobin 15.0 13.0 - 17.0 g/dL   Sample type VENOUS    Comment  NOTIFIED PHYSICIAN    DG Chest Port 1 View  Addendum Date: 06/19/2022   ADDENDUM REPORT: 06/19/2022 23:20 FINDINGS: There is no evidence for focal lung infiltrate, pleural effusion or pneumothorax. The cardiomediastinal silhouette is within normal limits. No acute fractures are seen. Strandy opacities in the lung bases are favored is atelectasis or scarring and unchanged from prior. IMPRESSION: No acute cardiopulmonary process. Electronically Signed   By: Darliss Cheney M.D.   On: 06/19/2022 23:20   Result Date: 06/19/2022 CLINICAL DATA:  Trauma EXAM: PORTABLE CHEST 1 VIEW COMPARISON:  Chest x-ray 06/04/2022 FINDINGS: The heart size and mediastinal contours are within normal limits. There some strandy opacities in both lung bases. Pleural effusion or pneumothorax. The visualized skeletal structures are unremarkable. IMPRESSION: Strandy opacities in both lung bases, likely atelectasis.  A Electronically Signed: By: Darliss Cheney M.D. On: 06/19/2022 17:52   CT HEAD WO CONTRAST  Result Date: 06/19/2022 CLINICAL DATA:  Head trauma, moderate-severe; Polytrauma, blunt. Fall. Dizziness. On Plavix. EXAM: CT HEAD WITHOUT CONTRAST CT CERVICAL SPINE WITHOUT CONTRAST TECHNIQUE: Multidetector CT imaging of the head and cervical spine was performed following the standard protocol without intravenous contrast. Multiplanar CT image reconstructions of the cervical spine were also generated. RADIATION DOSE REDUCTION: This exam was performed according to the departmental dose-optimization program which includes automated exposure control, adjustment of the mA and/or kV according to patient size and/or use of iterative reconstruction technique. COMPARISON:  Head CT 06/04/2022 FINDINGS: CT HEAD FINDINGS Brain: There is no evidence of an acute infarct, intracranial hemorrhage, mass, midline shift, or extra-axial fluid collection. Patchy hypodensities in the cerebral white matter bilaterally are unchanged and nonspecific but  compatible with moderate chronic small vessel ischemic disease. Chronic lacunar infarcts are again seen in the right thalamus and left cerebellar hemisphere. The ventricles and sulci are within normal limits for age. Vascular: Calcified atherosclerosis at the skull base. No hyperdense vessel. Skull: No acute fracture or suspicious osseous lesion. Sinuses/Orbits: Mild mucosal thickening in the paranasal sinuses. Clear mastoid air cells. Unremarkable orbits. Other: None. CT CERVICAL SPINE FINDINGS Alignment: Mild straightening of the normal cervical lordosis. Trace anterolisthesis of C4 on C5, likely degenerative. Skull base and vertebrae: No acute fracture or suspicious osseous lesion. Soft tissues and spinal canal: No prevertebral fluid or swelling. No visible canal hematoma. Disc levels: Mild multilevel disc degeneration. Severe facet arthrosis on the left at C3-4 and on the right at C4-5. Upper chest: No mass or consolidation in the included lung apices. Other: Mild calcific atherosclerosis at the carotid bifurcations. IMPRESSION: 1. No evidence of acute intracranial abnormality. 2. Moderate chronic small vessel ischemic disease. 3. No acute cervical spine fracture. Electronically Signed  By: Sebastian Ache M.D.   On: 06/19/2022 20:05   CT CERVICAL SPINE WO CONTRAST  Result Date: 06/19/2022 CLINICAL DATA:  Head trauma, moderate-severe; Polytrauma, blunt. Fall. Dizziness. On Plavix. EXAM: CT HEAD WITHOUT CONTRAST CT CERVICAL SPINE WITHOUT CONTRAST TECHNIQUE: Multidetector CT imaging of the head and cervical spine was performed following the standard protocol without intravenous contrast. Multiplanar CT image reconstructions of the cervical spine were also generated. RADIATION DOSE REDUCTION: This exam was performed according to the departmental dose-optimization program which includes automated exposure control, adjustment of the mA and/or kV according to patient size and/or use of iterative reconstruction  technique. COMPARISON:  Head CT 06/04/2022 FINDINGS: CT HEAD FINDINGS Brain: There is no evidence of an acute infarct, intracranial hemorrhage, mass, midline shift, or extra-axial fluid collection. Patchy hypodensities in the cerebral white matter bilaterally are unchanged and nonspecific but compatible with moderate chronic small vessel ischemic disease. Chronic lacunar infarcts are again seen in the right thalamus and left cerebellar hemisphere. The ventricles and sulci are within normal limits for age. Vascular: Calcified atherosclerosis at the skull base. No hyperdense vessel. Skull: No acute fracture or suspicious osseous lesion. Sinuses/Orbits: Mild mucosal thickening in the paranasal sinuses. Clear mastoid air cells. Unremarkable orbits. Other: None. CT CERVICAL SPINE FINDINGS Alignment: Mild straightening of the normal cervical lordosis. Trace anterolisthesis of C4 on C5, likely degenerative. Skull base and vertebrae: No acute fracture or suspicious osseous lesion. Soft tissues and spinal canal: No prevertebral fluid or swelling. No visible canal hematoma. Disc levels: Mild multilevel disc degeneration. Severe facet arthrosis on the left at C3-4 and on the right at C4-5. Upper chest: No mass or consolidation in the included lung apices. Other: Mild calcific atherosclerosis at the carotid bifurcations. IMPRESSION: 1. No evidence of acute intracranial abnormality. 2. Moderate chronic small vessel ischemic disease. 3. No acute cervical spine fracture. Electronically Signed   By: Sebastian Ache M.D.   On: 06/19/2022 20:05   DG Pelvis Portable  Result Date: 06/19/2022 CLINICAL DATA:  Trauma EXAM: PORTABLE PELVIS 1-2 VIEWS COMPARISON:  None Available. FINDINGS: There is no evidence of pelvic fracture or diastasis. No pelvic bone lesions are seen. There are mild moderate degenerative changes of both hips. Peripheral vascular calcifications are present. IMPRESSION: 1. No acute fracture or dislocation. 2.  Mild-to-moderate degenerative changes of both hips. Electronically Signed   By: Darliss Cheney M.D.   On: 06/19/2022 17:51    Pending Labs Unresulted Labs (From admission, onward)     Start     Ordered   06/20/22 0500  Prealbumin  Tomorrow morning,   R        06/19/22 2335   06/20/22 0100  Osmolality  Once,   AD        06/20/22 0100   06/20/22 0100  Vitamin B1  Once,   AD        06/20/22 0100   06/19/22 2336  CK  Add-on,   AD        06/19/22 2335   06/19/22 2336  Ammonia  Once,   STAT       Question:  Release to patient  Answer:  Immediate   06/19/22 2335   06/19/22 2336  Lactic acid, plasma  STAT Now then every 3 hours,   R (with STAT occurrences)     Question:  Release to patient  Answer:  Immediate   06/19/22 2335   06/19/22 2336  Magnesium  Add-on,   AD  06/19/22 2335   06/19/22 2336  Phosphorus  Add-on,   AD        06/19/22 2335   06/19/22 2336  Osmolality, urine  Once,   URGENT        06/19/22 2335   06/19/22 2336  Creatinine, urine, random  Once,   URGENT        06/19/22 2335   06/19/22 2336  Sodium, urine, random  Once,   URGENT        06/19/22 2335   06/19/22 2336  TSH  Add-on,   AD        06/19/22 2335   06/19/22 1730  Sample to Blood Bank  (Trauma Panel)  Once,   URGENT        06/19/22 1732   Signed and Held  HIV Antibody (routine testing w rflx)  (HIV Antibody (Routine testing w reflex) panel)  Once,   R        Signed and Held   Signed and Held  Magnesium  Tomorrow morning,   R        Signed and Held   Signed and Held  Phosphorus  Tomorrow morning,   R        Signed and Held   Signed and Held  Comprehensive metabolic panel  Tomorrow morning,   R       Question:  Release to patient  Answer:  Immediate   Signed and Held   Signed and Held  CBC  Tomorrow morning,   R       Question:  Release to patient  Answer:  Immediate   Signed and Held            Vitals/Pain Today's Vitals   06/19/22 2215 06/19/22 2230 06/19/22 2345 06/20/22 0032  BP: (!)  136/91 (!) 146/93 123/81   Pulse: 84 86 87   Resp: 19 20 12    Temp:      TempSrc:      SpO2: 100% 99% 99% 98%  Weight:      Height:      PainSc:        Isolation Precautions No active isolations  Medications Medications  insulin aspart (novoLOG) injection 0-9 Units ( Subcutaneous Not Given 06/20/22 0046)  LORazepam (ATIVAN) tablet 1-4 mg (has no administration in time range)    Or  LORazepam (ATIVAN) injection 1-4 mg (has no administration in time range)  thiamine (VITAMIN B1) tablet 100 mg (has no administration in time range)    Or  thiamine (VITAMIN B1) injection 100 mg (has no administration in time range)  folic acid (FOLVITE) tablet 1 mg (has no administration in time range)  multivitamin with minerals tablet 1 tablet (has no administration in time range)  sodium chloride 0.9 % bolus 1,000 mL (0 mLs Intravenous Stopped 06/19/22 1849)  thiamine (VITAMIN B1) injection 100 mg (100 mg Intravenous Given 06/19/22 2010)  dextrose 5 % and 0.45 % NaCl with KCl 20 mEq/L infusion ( Intravenous New Bag/Given 06/19/22 2004)  lactated ringers bolus 2,000 mL (0 mLs Intravenous Stopped 06/19/22 2139)    Mobility walks     Focused Assessments     R Recommendations: See Admitting Provider Note  Report given to:   Additional Notes: Patient Dx with lactic acidosis, receiving fluids now. Also on CIWA due to chronic ETOH use.

## 2022-06-20 NOTE — Assessment & Plan Note (Signed)
Chronic stable continue Lipitor 80 mg a day 

## 2022-06-20 NOTE — Discharge Instructions (Signed)
Follow with Primary MD [facility] in 7 days  ° °Get CBC, CMP, checked  by Primary MD next visit.  ° ° °Activity: As tolerated with Full fall precautions use walker/cane & assistance as needed ° ° °Disposition Home  ° ° °Diet: Heart Healthy   ° ° °On your next visit with your primary care physician please Get Medicines reviewed and adjusted. ° ° °Please request your Prim.MD to go over all Hospital Tests and Procedure/Radiological results at the follow up, please get all Hospital records sent to your Prim MD by signing hospital release before you go home. ° ° °If you experience worsening of your admission symptoms, develop shortness of breath, life threatening emergency, suicidal or homicidal thoughts you must seek medical attention immediately by calling 911 or calling your MD immediately  if symptoms less severe. ° °You Must read complete instructions/literature along with all the possible adverse reactions/side effects for all the Medicines you take and that have been prescribed to you. Take any new Medicines after you have completely understood and accpet all the possible adverse reactions/side effects.  ° °Do not drive, operating heavy machinery, perform activities at heights, swimming or participation in water activities or provide baby sitting services if your were admitted for syncope or siezures until you have seen by Primary MD or a Neurologist and advised to do so again. ° °Do not drive when taking Pain medications.  ° ° °Do not take more than prescribed Pain, Sleep and Anxiety Medications ° °Special Instructions: If you have smoked or chewed Tobacco  in the last 2 yrs please stop smoking, stop any regular Alcohol  and or any Recreational drug use. ° °Wear Seat belts while driving. ° ° °Please note ° °You were cared for by a hospitalist during your hospital stay. If you have any questions about your discharge medications or the care you received while you were in the hospital after you are  discharged, you can call the unit and asked to speak with the hospitalist on call if the hospitalist that took care of you is not available. Once you are discharged, your primary care physician will handle any further medical issues. Please note that NO REFILLS for any discharge medications will be authorized once you are discharged, as it is imperative that you return to your primary care physician (or establish a relationship with a primary care physician if you do not have one) for your aftercare needs so that they can reassess your need for medications and monitor your lab values. ° °

## 2022-06-20 NOTE — Assessment & Plan Note (Signed)
Follow serial bmet, gently rehydrate, obtain urine elctrolytes

## 2022-06-20 NOTE — Evaluation (Signed)
Occupational Therapy Evaluation/Discharge Patient Details Name: Ricardo Howard MRN: [id number] DOB: 31-Dec-1960 Today's Date: 06/20/2022   History of Present Illness Pt is a 62 y/o male admitted for hypotension s/p fall at home. +etoh. PMH: CAD, DM2, CHF, alcohol abuse, tobacco abuse, PVD, PTSD, R BKA, sleep apnea, chronic hypoxic respiratory failure on 3 L O2   Clinical Impression   PTA, pt lives with wife and typically Modified Independent with ADLs, IADLs and mobility (RW vs scooter for longer distances). Pt active in gardening at home. Pt presents now at baseline for ADLs/mobility. Pt able to mobilize appropriate distance in hallway without AD or LOB. Abnormal gait noted though feel this is due to leg length discrepancy with prosthetic in which pt has an appointment for re-evaluation of prosthetic fit. Pt able to manage ADLs without physical assist and denies any concerns. BP pre activity - 99/66 though improved with activity and no dizziness reported. Pt functionally appropriate for DC home w/ no further skilled therapy needs at this time.       Recommendations for follow up therapy are one component of a multi-disciplinary discharge planning process, led by the attending physician.  Recommendations may be updated based on patient status, additional functional criteria and insurance authorization.   Assistance Recommended at Discharge PRN  Patient can return home with the following      Functional Status Assessment  Patient has not had a recent decline in their functional status  Equipment Recommendations  None recommended by OT    Recommendations for Other Services       Precautions / Restrictions Precautions Precautions: Other (comment) Precaution Comments: R prosthetic LE; relatively low fall risk Restrictions Weight Bearing Restrictions: No      Mobility Bed Mobility               General bed mobility comments: in chair on entry    Transfers Overall transfer  level: Modified independent Equipment used: None                      Balance Overall balance assessment: Mild deficits observed, not formally tested (likely due to leg length difference)                                         ADL either performed or assessed with clinical judgement   ADL Overall ADL's : Needs assistance/impaired Eating/Feeding: Independent   Grooming: Modified independent;Standing   Upper Body Bathing: Modified independent   Lower Body Bathing: Modified independent   Upper Body Dressing : Modified independent   Lower Body Dressing: Modified independent   Toilet Transfer: Supervision/safety;Ambulation   Toileting- Clothing Manipulation and Hygiene: Modified independent       Functional mobility during ADLs: Supervision/safety       Vision Baseline Vision/History: 0 No visual deficits Ability to See in Adequate Light: 0 Adequate Patient Visual Report: No change from baseline Vision Assessment?: No apparent visual deficits     Perception     Praxis      Pertinent Vitals/Pain Pain Assessment Pain Assessment: No/denies pain     Hand Dominance Right   Extremity/Trunk Assessment Upper Extremity Assessment Upper Extremity Assessment: Overall WFL for tasks assessed   Lower Extremity Assessment Lower Extremity Assessment: Overall WFL for tasks assessed;RLE deficits/detail RLE Deficits / Details: R LE BKA w/ prosthetic   Cervical / Trunk Assessment Cervical / Trunk Assessment:  Normal   Communication Communication Communication: No difficulties   Cognition Arousal/Alertness: Awake/alert Behavior During Therapy: WFL for tasks assessed/performed Overall Cognitive Status: Within Functional Limits for tasks assessed                                       General Comments  wife present and supportive. BP 99/66 sitting in chair and improved w/ continued activity. discussed checking BP prior to taking BP  meds    Exercises     Shoulder Instructions      Home Living Family/patient expects to be discharged to:: Private residence Living Arrangements: Spouse/significant other Available Help at Discharge: Family;Available PRN/intermittently Type of Home: House Home Access: Stairs to enter [employer] of Steps: 5 Entrance Stairs-Rails: Can reach both Home Layout: One level     Bathroom Shower/Tub: Producer, television/film/video: Standard     Home Equipment: Agricultural consultant (2 wheels);Crutches;Wheelchair - Surveyor, mining (4 wheels);Shower seat;Grab bars - tub/shower   Additional Comments: 3 L O2 prn, mostly at night per pt      Prior Functioning/Environment Prior Level of Function : Independent/Modified Independent;Driving             Mobility Comments: independent with use of prosthetic, gardens for activity at home, and walks in mall. use of RW vs scooter for a long distance or out in community ADLs Comments: independent        OT Problem List:        OT Treatment/Interventions:      OT Goals(Current goals can be found in the care plan section) Acute Rehab OT Goals Patient Stated Goal: home today OT Goal Formulation: All assessment and education complete, DC therapy  OT Frequency:      Co-evaluation              AM-PAC OT "6 Clicks" Daily Activity     Outcome Measure Help from another person eating meals?: None Help from another person taking care of personal grooming?: None Help from another person toileting, which includes using toliet, bedpan, or urinal?: None Help from another person bathing (including washing, rinsing, drying)?: None Help from another person to put on and taking off regular upper body clothing?: None Help from another person to put on and taking off regular lower body clothing?: None 6 Click Score: 24   End of Session Equipment Utilized During Treatment: Gait belt  Activity Tolerance: Patient  tolerated treatment well Patient left: in chair;with call bell/phone within reach;with nursing/sitter in room;with family/visitor present  OT Visit Diagnosis: Other abnormalities of gait and mobility (R26.89)                Time: 1610-9604 OT Time Calculation (min): 20 min Charges:  OT General Charges $OT Visit: 1 Visit OT Evaluation $OT Eval Low Complexity: 1 Low  Bradd Canary, OTR/L Acute Rehab Services Office: [phone]   Lorre Munroe 06/20/2022, 10:08 AM

## 2022-06-20 NOTE — Assessment & Plan Note (Signed)
Recurrent may be secondary to poor clearance due to hepatic steatosis rehydrate and repeat

## 2022-06-20 NOTE — Assessment & Plan Note (Signed)
Order CIWA protocol °

## 2022-06-22 LAB — VITAMIN B1: Vitamin B1 (Thiamine): 391.1 nmol/L — ABNORMAL HIGH (ref 66.5–200.0)

## 2022-10-06 ENCOUNTER — Encounter (HOSPITAL_COMMUNITY): Payer: Self-pay

## 2022-10-06 ENCOUNTER — Emergency Department (HOSPITAL_COMMUNITY)
Admission: EM | Admit: 2022-10-06 | Discharge: 2022-10-06 | Disposition: A | Discharge status: Home / Self Care | Payer: No Typology Code available for payment source | Attending: Emergency Medicine | Admitting: Emergency Medicine

## 2022-10-06 ENCOUNTER — Other Ambulatory Visit: Payer: Self-pay

## 2022-10-06 DIAGNOSIS — Z7982 Long term (current) use of aspirin: Secondary | ICD-10-CM | POA: Insufficient documentation

## 2022-10-06 DIAGNOSIS — I509 Heart failure, unspecified: Secondary | ICD-10-CM | POA: Diagnosis not present

## 2022-10-06 DIAGNOSIS — Z79899 Other long term (current) drug therapy: Secondary | ICD-10-CM | POA: Insufficient documentation

## 2022-10-06 DIAGNOSIS — L0291 Cutaneous abscess, unspecified: Secondary | ICD-10-CM

## 2022-10-06 DIAGNOSIS — Z794 Long term (current) use of insulin: Secondary | ICD-10-CM | POA: Insufficient documentation

## 2022-10-06 DIAGNOSIS — N492 Inflammatory disorders of scrotum: Secondary | ICD-10-CM | POA: Insufficient documentation

## 2022-10-06 DIAGNOSIS — Z7902 Long term (current) use of antithrombotics/antiplatelets: Secondary | ICD-10-CM | POA: Insufficient documentation

## 2022-10-06 DIAGNOSIS — I110 Hypertensive heart disease with heart failure: Secondary | ICD-10-CM | POA: Diagnosis not present

## 2022-10-06 DIAGNOSIS — E1142 Type 2 diabetes mellitus with diabetic polyneuropathy: Secondary | ICD-10-CM | POA: Insufficient documentation

## 2022-10-06 LAB — BASIC METABOLIC PANEL
Anion gap: 14 (ref 5–15)
BUN: 15 mg/dL (ref 8–23)
CO2: 24 mmol/L (ref 22–32)
Calcium: 9.3 mg/dL (ref 8.9–10.3)
Chloride: 100 mmol/L (ref 98–111)
Creatinine, Ser: 0.93 mg/dL (ref 0.61–1.24)
GFR, Estimated: >60 mL/min (ref >60)
Glucose, Bld: 343 mg/dL — ABNORMAL HIGH (ref 70–99)
Potassium: 4.3 mmol/L (ref 3.5–5.1)
Sodium: 138 mmol/L (ref 135–145)

## 2022-10-06 LAB — CBC WITH DIFFERENTIAL/PLATELET
Abs Immature Granulocytes: 0.04 10*3/uL (ref 0.00–0.07)
Basophils Absolute: 0 10*3/uL (ref 0.0–0.1)
Basophils Relative: 1 %
Eosinophils Absolute: 0.1 10*3/uL (ref 0.0–0.5)
Eosinophils Relative: 1 %
HCT: 46.1 % (ref 39.0–52.0)
Hemoglobin: 15.1 g/dL (ref 13.0–17.0)
Immature Granulocytes: 1 %
Lymphocytes Relative: 23 %
Lymphs Abs: 1.5 10*3/uL (ref 0.7–4.0)
MCH: 30.9 pg (ref 26.0–34.0)
MCHC: 32.8 g/dL (ref 30.0–36.0)
MCV: 94.3 fL (ref 80.0–100.0)
Monocytes Absolute: 0.4 10*3/uL (ref 0.1–1.0)
Monocytes Relative: 7 %
Neutro Abs: 4.3 10*3/uL (ref 1.7–7.7)
Neutrophils Relative %: 67 %
Platelets: 168 10*3/uL (ref 150–400)
RBC: 4.89 MIL/uL (ref 4.22–5.81)
RDW: 15.6 % — ABNORMAL HIGH (ref 11.5–15.5)
WBC: 6.3 10*3/uL (ref 4.0–10.5)
nRBC: 0 % (ref 0.0–0.2)

## 2022-10-06 LAB — CBG MONITORING, ED: Glucose-Capillary: 323 mg/dL — ABNORMAL HIGH (ref 70–99)

## 2022-10-06 MED ORDER — SODIUM CHLORIDE 0.9 % IV SOLN
2.0000 g | Freq: Once | INTRAVENOUS | Status: AC
  Administered 2022-10-06: 2 g via INTRAVENOUS
  Filled 2022-10-06: qty 20

## 2022-10-06 MED ORDER — SODIUM CHLORIDE 0.9 % IV SOLN
100.0000 mg | Freq: Two times a day (BID) | INTRAVENOUS | Status: DC
  Filled 2022-10-06: qty 100

## 2022-10-06 MED ORDER — DOXYCYCLINE HYCLATE 100 MG PO CAPS: 100.0000 mg | ORAL_CAPSULE | Freq: Two times a day (BID) | ORAL | 0 refills | Status: DC

## 2022-10-06 MED ORDER — SODIUM CHLORIDE 0.9 % IV SOLN
100.0000 mg | Freq: Once | INTRAVENOUS | Status: AC
  Administered 2022-10-06: 100 mg via INTRAVENOUS

## 2022-10-06 NOTE — Discharge Instructions (Addendum)
Please take your medications as prescribed. Use warm compresses 3 times a day. Take tylenol/ibuprofen for pain. I recommend close follow-up with PCP and urology for reevaluation.  Please do not hesitate to return to emergency department if worrisome signs symptoms we discussed become apparent.

## 2022-10-06 NOTE — ED Provider Notes (Signed)
EMERGENCY DEPARTMENT AT [facility] Provider Note   CSN: [id number] Arrival date & time: 10/06/22  7829     History  Chief Complaint  Patient presents with   Abscess   Testicle Pain    Ricardo Howard is a 62 y.o. male history of CHF, diabetes, hyperlipidemia, hypertension presents today for evaluation of abscess.  Patient states that he noticed some swelling and drainage at the base of his scrotum about a week ago.  He denies fever, cold/chills, nausea or vomiting.  History of diabetes.  No urinary symptoms.  No back pain.   Abscess Testicle Pain      Past Medical History:  Diagnosis Date   Alcohol abuse 06/14/2019   Anxiety    Back pain    Cataracts, bilateral    CHF (congestive heart failure) (HCC)    Depression    Diabetic retinopathy (HCC)    Essential hypertension 06/14/2019   Hepatitis C    cured   Hx of BKA, right (HCC)    Hyperlipidemia    Hypertension    Mixed hyperlipidemia due to type 2 diabetes mellitus (HCC) 07/31/2019   Nicotine dependence, cigarettes, uncomplicated 07/31/2019   PAD (peripheral artery disease) (HCC)    Pancreatitis    PTSD (post-traumatic stress disorder)    Uncontrolled type 2 diabetes mellitus with diabetic polyneuropathy, with long-term current use of insulin 07/31/2019   Past Surgical History:  Procedure Laterality Date   ABDOMINAL AORTOGRAM W/LOWER EXTREMITY N/A 12/02/2020   Procedure: ABDOMINAL AORTOGRAM W/LOWER EXTREMITY;  Surgeon: Leonie Douglas, MD;  Location: [facility] INVASIVE CV LAB;  Service: Cardiovascular;  Laterality: N/A;   AMPUTATION Right 03/13/2020   Procedure: TRANSMETATARSAL AMPUTATION;  Surgeon: Chuck Hint, MD;  Location: [facility] OR;  Service: Vascular;  Laterality: Right;   AMPUTATION Right 03/15/2020   Procedure: RIGHT BELOW KNEE AMPUTATION;  Surgeon: Chuck Hint, MD;  Location: [facility] OR;  Service: Vascular;  Laterality: Right;   CORONARY ANGIOPLASTY WITH STENT PLACEMENT      CORONARY STENT INTERVENTION N/A 07/16/2021   Procedure: CORONARY STENT INTERVENTION;  Surgeon: Orbie Pyo, MD;  Location: [facility] INVASIVE CV LAB;  Service: Cardiovascular;  Laterality: N/A;   LEFT HEART CATH AND CORONARY ANGIOGRAPHY N/A 07/16/2021   Procedure: LEFT HEART CATH AND CORONARY ANGIOGRAPHY;  Surgeon: Orbie Pyo, MD;  Location: [facility] INVASIVE CV LAB;  Service: Cardiovascular;  Laterality: N/A;   PERIPHERAL VASCULAR INTERVENTION  12/02/2020   Procedure: PERIPHERAL VASCULAR INTERVENTION;  Surgeon: Leonie Douglas, MD;  Location: [facility] INVASIVE CV LAB;  Service: Cardiovascular;;  Lt SFA, LEIA     Home Medications Prior to Admission medications   Medication Sig Start Date End Date Taking? Authorizing Provider  acetaminophen (TYLENOL) 325 MG tablet Take 2 tablets (650 mg total) by mouth every 6 (six) hours as needed for mild pain (or Fever >/= 101). 06/20/22   Elgergawy, Leana Roe, MD  aspirin EC 81 MG tablet Take 81 mg by mouth in the morning.    [provider]  atorvastatin (LIPITOR) 80 MG tablet Take 1 tablet (80 mg total) by mouth at bedtime. 08/30/21   Wendall Stade, MD  clopidogrel (PLAVIX) 75 MG tablet Take 75 mg by mouth daily.    [provider]  cyclobenzaprine (FLEXERIL) 10 MG tablet Take 10 mg by mouth 3 (three) times daily as needed for muscle spasms. 03/29/20   [provider]  dicyclomine (BENTYL) 20 MG tablet Take 20 mg by mouth in  the morning and at bedtime.    [provider]  docusate sodium (COLACE) 100 MG capsule Take 100 mg by mouth daily as needed for mild constipation.    [provider]  empagliflozin (JARDIANCE) 25 MG TABS tablet Take 1 tablet (25 mg total) by mouth daily. Patient taking differently: Take 12.5 mg by mouth daily. 08/30/21   Wendall Stade, MD  ezetimibe (ZETIA) 10 MG tablet Take 10 mg by mouth daily.    [provider]  folic acid (FOLVITE) 1 MG tablet Take 1 tablet (1 mg total) by mouth daily.  10/11/19   Rodolph Bong, MD  furosemide (LASIX) 40 MG tablet Take 1 tablet (40 mg total) by mouth daily. 06/23/22   Elgergawy, Leana Roe, MD  gabapentin (NEURONTIN) 300 MG capsule Take 300-600 mg by mouth See admin instructions. Take 1 capsule (300 mg) by mouth in the morning, take 1 capsule (300 mg) by mouth at lunch, & take 2 capsules (600 mg) by mouth at bedtime    [provider]  insulin aspart (NOVOLOG) 100 UNIT/ML injection Inject 10 Units into the skin 3 (three) times daily with meals. Patient taking differently: Inject 20 Units into the skin 3 (three) times daily with meals. Sliding Scale Insulin 07/19/21   Evlyn Kanner, MD  insulin glargine (LANTUS) 100 UNIT/ML Solostar Pen 30units in am and 35unit at night  Further adjustment per your pcp and endocrinology Goal of a1c is less than 7%, currently your a1c is 10%, not at goal Patient taking differently: Inject 30 Units into the skin 2 (two) times daily. Further adjustment per your pcp and endocrinology Goal of a1c is less than 7%, currently your a1c is 10%, not at goal 06/03/21   Albertine Grates, MD  Insulin Pen Needle (NOVOFINE) 30G X 8 MM MISC Inject 10 each into the skin as needed. 10/10/19   Rodolph Bong, MD  lipase/protease/amylase (CREON) 36000 UNITS CPEP capsule Take 1 capsule (36,000 Units total) by mouth 3 (three) times daily before meals. 06/17/19   Laverna Peace, MD  lurasidone (LATUDA) 40 MG TABS tablet Take 40 mg by mouth every evening.    [provider]  magnesium oxide (MAG-OX) 400 (241.3 Mg) MG tablet Take 1 tablet (400 mg total) by mouth 2 (two) times daily. 03/18/20   Burnadette Pop, MD  metoprolol succinate (TOPROL XL) 25 MG 24 hr tablet Take 1 tablet (25 mg total) by mouth daily. 06/22/22 07/22/22  Elgergawy, Leana Roe, MD  Multiple Vitamin (QUINTABS) TABS Take 1 tablet by mouth daily.    [provider]  naloxone [facility]) nasal spray 4 mg/0.1 mL Place 1 spray into the nose as needed (opioid  reversal).    [provider]  naltrexone (DEPADE) 50 MG tablet Take 50 mg by mouth daily.    [provider]  nitroGLYCERIN (NITROSTAT) 0.4 MG SL tablet Place 1 tablet (0.4 mg total) under the tongue every 5 (five) minutes as needed for chest pain. 07/19/21   Evlyn Kanner, MD  ondansetron (ZOFRAN-ODT) 4 MG disintegrating tablet Take 4 mg by mouth every 6 (six) hours as needed for nausea. 06/06/22   [provider]  pantoprazole (PROTONIX) 40 MG tablet Take 40 mg by mouth in the morning. 10/10/20   [provider]  polyethylene glycol (MIRALAX / GLYCOLAX) 17 g packet Take 17 g by mouth daily as needed for mild constipation. 03/18/20   Burnadette Pop, MD  potassium chloride SA (KLOR-CON M) 20 MEQ tablet  Take 1 tablet (20 mEq total) by mouth daily. Patient taking differently: Take 20 mEq by mouth daily after lunch. 08/31/21   Manson Passey, PA  ranolazine (RANEXA) 500 MG 12 hr tablet Take 1 tablet by mouth daily at 6 (six) AM. 09/27/21   [provider]  sacubitril-valsartan (ENTRESTO) 24-26 MG Take 1 tablet by mouth 2 (two) times daily. 06/25/22   Elgergawy, Leana Roe, MD  thiamine 100 MG tablet Take 1 tablet (100 mg total) by mouth daily. 10/11/19   Rodolph Bong, MD  vitamin B-12 (CYANOCOBALAMIN) 500 MCG tablet Take 500 mcg by mouth in the morning. 01/20/20   [provider]      Allergies    Iohexol, Ozempic (0.25 or 0.5 mg-dose) [semaglutide(0.25 or 0.5mg -dos)], Shellfish allergy, Milk (cow), Zestril [lisinopril], and Zocor [simvastatin]    Review of Systems   Review of Systems  Genitourinary:  Positive for testicular pain.    Physical Exam Updated Vital Signs BP 103/72 (BP Location: Right Arm)   Pulse 88   Temp 97.8 F (36.6 C) (Oral)   Resp 15   Ht 5\' 8"  (1.727 m)   Wt 104.3 kg   SpO2 99%   BMI 34.97 kg/m  Physical Exam Vitals and nursing note reviewed.  Constitutional:      Appearance: Normal appearance.  HENT:      Head: Normocephalic and atraumatic.     Mouth/Throat:     Mouth: Mucous membranes are moist.  Eyes:     General: No scleral icterus. Cardiovascular:     Rate and Rhythm: Normal rate and regular rhythm.     Pulses: Normal pulses.     Heart sounds: Normal heart sounds.  Pulmonary:     Effort: Pulmonary effort is normal.     Breath sounds: Normal breath sounds.  Abdominal:     General: Abdomen is flat.     Palpations: Abdomen is soft.     Tenderness: There is no abdominal tenderness.  Genitourinary:    Comments: GU exam done with RN present throughout examination.  1 x 1 cm area of induration and overlying skin erythema at the base of the scrotum on left side. Was able to drain some yellow fluid from the opening on the abscess. Musculoskeletal:        General: No deformity.  Skin:    General: Skin is warm.     Findings: No rash.  Neurological:     General: No focal deficit present.     Mental Status: He is alert.  Psychiatric:        Mood and Affect: Mood normal.     ED Results / Procedures / Treatments   Labs (all labs ordered are listed, but only abnormal results are displayed) Labs Reviewed  CBC WITH DIFFERENTIAL/PLATELET  BASIC METABOLIC PANEL  CBG MONITORING, ED    EKG None  Radiology No results found.  Procedures Procedures    Medications Ordered in ED Medications - No data to display  ED Course/ Medical Decision Making/ A&P                                 Medical Decision Making Amount and/or Complexity of Data Reviewed Labs: ordered.  Risk Prescription drug management.   This patient presents to the ED for abscess, this involves an extensive number of treatment options, and is a complaint that carries with a high risk of complications and morbidity.  The differential diagnosis includes abscess, cyst, cellulitis, Fournier's gangrene, UTI, inguinal hernia.  This is not an exhaustive list.  Lab tests: I ordered and personally interpreted labs.   The pertinent results include: WBC unremarkable. Hbg unremarkable. Platelets unremarkable. Electrolytes unremarkable. BUN, creatinine unremarkable.  POC CBG 323.  Imaging studies:  Problem list/ ED course/ Critical interventions/ Medical management: HPI: See above Vital signs within normal range and stable throughout visit. Laboratory/imaging studies significant for: See above. On physical examination, patient is afebrile and appears in no acute distress. GU exam done with RN present throughout examination.  There is a 1 x 1 cm area of induration and overlying skin erythema at the base of the scrotum on left side with focal tenderness. Was able to drain some yellow fluid from the opening on the abscess.  Based on physical examination and workup, I do not think that patient has testicular torsion, severe cellulitis, Fournier's gangrene or any other emergent causes.  Given IV Rocephin and doxycycline here.  Will send Rx of doxycycline for abscess.  Advised patient to follow-up with PCP and urology for reevaluation and management.  Strict return precaution discussed.  I have reviewed the patient home medicines and have made adjustments as needed.  Cardiac monitoring/EKG: The patient was maintained on a cardiac monitor.  I personally reviewed and interpreted the cardiac monitor which showed an underlying rhythm of: sinus rhythm.  Additional history obtained: External records from outside source obtained and reviewed including: Chart review including previous notes, labs, imaging.  Consultations obtained:  Disposition Continued outpatient therapy. Follow-up with PCP recommended for reevaluation of symptoms. Treatment plan discussed with patient.  Pt acknowledged understanding was agreeable to the plan. Worrisome signs and symptoms were discussed with patient, and patient acknowledged understanding to return to the ED if they noticed these signs and symptoms. Patient was stable upon discharge.    This chart was dictated using voice recognition software.  Despite best efforts to proofread,  errors can occur which can change the documentation meaning.          Final Clinical Impression(s) / ED Diagnoses Final diagnoses:  Abscess    Rx / DC Orders ED Discharge Orders          Ordered    doxycycline (VIBRAMYCIN) 100 MG capsule  2 times daily,   Status:  Discontinued        10/06/22 1437    doxycycline (VIBRAMYCIN) 100 MG capsule  2 times daily        10/06/22 1511              Jeanelle Malling, PA 10/06/22 1912    Virgina Norfolk, DO 10/07/22 0825

## 2022-10-06 NOTE — ED Triage Notes (Signed)
Pt arrived POV c/o an abscess on his testicles that he first noticed yesterday but then this morning it ruptured and his wife stated it was yellow pus coming out. Pt wants to get it checked to make sure everything is ok.

## 2022-10-12 ENCOUNTER — Telehealth: Payer: Self-pay

## 2022-10-12 NOTE — Telephone Encounter (Signed)
Transition Care Management Follow-up Telephone Call Date of discharge and from where: [facility] 8/17 How have you been since you were released from the hospital? Doing good and has an appointment set to see PCP Any questions or concerns? No  Items Reviewed: Did the pt receive and understand the discharge instructions provided? Yes  Medications obtained and verified? Yes  Other? No  Any new allergies since your discharge? No  Dietary orders reviewed? No Do you have support at home? Yes    Follow up appointments reviewed:  PCP Hospital f/u appt confirmed? Yes  Scheduled to see PCP on  @ . Specialist Hospital f/u appt confirmed? No  Scheduled to see  on  @ . Are transportation arrangements needed? No  If their condition worsens, is the pt aware to call PCP or go to the Emergency Dept.? Yes Was the patient provided with contact information for the PCP's office or ED? Yes Was to pt encouraged to call back with questions or concerns? Yes

## 2022-10-15 ENCOUNTER — Telehealth: Payer: Self-pay | Admitting: *Deleted

## 2022-10-15 NOTE — Progress Notes (Signed)
  Care Coordination   Note   10/15/2022 Name: Ricardo Howard MRN: [id number] DOB: 1960/08/27  Ricardo Howard is a 62 y.o. year old male who [facility]. I reached out to [name] by phone today to offer care coordination services.  [name] was given information about Care Coordination services today including:   The Care Coordination services include support from the care team which includes your Nurse Coordinator, Clinical Social Worker, or Pharmacist.  The Care Coordination team is here to help remove barriers to the health concerns and goals most important to you. Care Coordination services are voluntary, and the patient may decline or stop services at any time by request to their care team member.   Care Coordination Consent Status: Patient agreed to services and verbal consent obtained.   Follow up plan:  Telephone appointment with care coordination team member scheduled for:  10/19/22  Encounter Outcome:  Pt. Scheduled  [facility] Coordination Care Guide  Direct Dial: [phone]

## 2022-10-19 ENCOUNTER — Ambulatory Visit: Payer: Self-pay | Admitting: Licensed Clinical Social Worker

## 2022-10-19 NOTE — Patient Outreach (Signed)
  Care Coordination   Initial Visit Note   10/19/2022 Name: Ricardo Howard MRN: [id number] DOB: 1961/01/29  Ricardo Howard is a 62 y.o. year old male who [facility]. I spoke with  Ricardo Howard by phone today.  What matters to the patients health and wellness today?  Care Coordination    Goals Addressed             This Visit's Progress    COMPLETED: LCSW-Care Coordination Interventions   On track    Activities and task to complete in order to accomplish goals.   Keep all upcoming appointments discussed today Continue with compliance of taking medication prescribed by Doctor Implement healthy coping skills discussed to assist with management of symptoms         SDOH assessments and interventions completed:  Yes  SDOH Interventions Today    Flowsheet Row Most Recent Value  SDOH Interventions   Food Insecurity Interventions Intervention Not Indicated  Housing Interventions Intervention Not Indicated  Transportation Interventions Intervention Not Indicated        Care Coordination Interventions:  Yes, provided  Interventions Today    Flowsheet Row Most Recent Value  Chronic Disease   Chronic disease during today's visit Hypertension (HTN), Congestive Heart Failure (CHF), Diabetes, Other  [Anxiety and Depression]  General Interventions   General Interventions Discussed/Reviewed General Interventions Discussed, Doctor Visits, Community Resources  Doctor Visits Discussed/Reviewed Doctor Visits Discussed  Mental Health Interventions   Mental Health Discussed/Reviewed Mental Health Discussed, Coping Strategies, Anxiety, Depression  Nutrition Interventions   Nutrition Discussed/Reviewed Nutrition Discussed  Pharmacy Interventions   Pharmacy Dicussed/Reviewed Pharmacy Topics Discussed, Medication Adherence       Follow up plan: No further intervention required.   Encounter Outcome:  Pt. Visit Completed   Ricardo Howard,  MSW, LCSW [facility] Care Management [facility]  [facility] [email or web address] Phone [phone] 10:35 AM

## 2022-10-19 NOTE — Patient Instructions (Signed)
Visit Information  Thank you for taking time to visit with me today. Please don't hesitate to contact me if I can be of assistance to you.   Following are the goals we discussed today:   Goals Addressed             This Visit's Progress    COMPLETED: LCSW-Care Coordination Interventions   On track    Activities and task to complete in order to accomplish goals.   Keep all upcoming appointments discussed today Continue with compliance of taking medication prescribed by Doctor Implement healthy coping skills discussed to assist with management of symptoms         Please call the care guide team at [phone] if you need to cancel or reschedule your appointment.   If you are experiencing a Mental Health or Behavioral Health Crisis or need someone to talk to, please call the Suicide and Crisis Lifeline: 988 call 911   Patient verbalizes understanding of instructions and care plan provided today and agrees to view in MyChart. Active MyChart status and patient understanding of how to access instructions and care plan via MyChart confirmed with patient.     No further follow up required: Patient is doing well and has strong support. He has plans to f/up with PCP. No needs at this time  Jenel Lucks, MSW, LCSW [facility]  [facility] [email or web address] Phone [phone] 10:36 AM

## 2023-02-13 ENCOUNTER — Encounter (HOSPITAL_COMMUNITY): Payer: Self-pay

## 2023-02-13 ENCOUNTER — Emergency Department (HOSPITAL_COMMUNITY): Payer: Medicare HMO

## 2023-02-13 ENCOUNTER — Observation Stay (HOSPITAL_COMMUNITY)
Admission: EM | Admit: 2023-02-13 | Discharge: 2023-02-14 | Disposition: A | Discharge status: Home / Self Care | Payer: Medicare HMO | Attending: Internal Medicine | Admitting: Internal Medicine

## 2023-02-13 ENCOUNTER — Other Ambulatory Visit: Payer: Self-pay

## 2023-02-13 DIAGNOSIS — E785 Hyperlipidemia, unspecified: Secondary | ICD-10-CM | POA: Diagnosis not present

## 2023-02-13 DIAGNOSIS — Z794 Long term (current) use of insulin: Secondary | ICD-10-CM | POA: Insufficient documentation

## 2023-02-13 DIAGNOSIS — Z7902 Long term (current) use of antithrombotics/antiplatelets: Secondary | ICD-10-CM | POA: Insufficient documentation

## 2023-02-13 DIAGNOSIS — E160 Drug-induced hypoglycemia without coma: Secondary | ICD-10-CM | POA: Diagnosis not present

## 2023-02-13 DIAGNOSIS — I959 Hypotension, unspecified: Secondary | ICD-10-CM | POA: Insufficient documentation

## 2023-02-13 DIAGNOSIS — Z1152 Encounter for screening for COVID-19: Secondary | ICD-10-CM | POA: Insufficient documentation

## 2023-02-13 DIAGNOSIS — I110 Hypertensive heart disease with heart failure: Secondary | ICD-10-CM | POA: Diagnosis not present

## 2023-02-13 DIAGNOSIS — E876 Hypokalemia: Secondary | ICD-10-CM | POA: Diagnosis not present

## 2023-02-13 DIAGNOSIS — Z79899 Other long term (current) drug therapy: Secondary | ICD-10-CM | POA: Insufficient documentation

## 2023-02-13 DIAGNOSIS — E1140 Type 2 diabetes mellitus with diabetic neuropathy, unspecified: Secondary | ICD-10-CM | POA: Diagnosis not present

## 2023-02-13 DIAGNOSIS — F1010 Alcohol abuse, uncomplicated: Secondary | ICD-10-CM | POA: Insufficient documentation

## 2023-02-13 DIAGNOSIS — R059 Cough, unspecified: Secondary | ICD-10-CM | POA: Insufficient documentation

## 2023-02-13 DIAGNOSIS — I2510 Atherosclerotic heart disease of native coronary artery without angina pectoris: Secondary | ICD-10-CM

## 2023-02-13 DIAGNOSIS — E669 Obesity, unspecified: Secondary | ICD-10-CM | POA: Insufficient documentation

## 2023-02-13 DIAGNOSIS — E11319 Type 2 diabetes mellitus with unspecified diabetic retinopathy without macular edema: Secondary | ICD-10-CM | POA: Insufficient documentation

## 2023-02-13 DIAGNOSIS — Z6833 Body mass index (BMI) 33.0-33.9, adult: Secondary | ICD-10-CM | POA: Insufficient documentation

## 2023-02-13 DIAGNOSIS — Z9861 Coronary angioplasty status: Secondary | ICD-10-CM | POA: Diagnosis not present

## 2023-02-13 DIAGNOSIS — T383X5A Adverse effect of insulin and oral hypoglycemic [antidiabetic] drugs, initial encounter: Secondary | ICD-10-CM

## 2023-02-13 DIAGNOSIS — I5042 Chronic combined systolic (congestive) and diastolic (congestive) heart failure: Secondary | ICD-10-CM | POA: Insufficient documentation

## 2023-02-13 DIAGNOSIS — Z720 Tobacco use: Secondary | ICD-10-CM | POA: Diagnosis present

## 2023-02-13 DIAGNOSIS — I10 Essential (primary) hypertension: Secondary | ICD-10-CM | POA: Diagnosis present

## 2023-02-13 DIAGNOSIS — Z7982 Long term (current) use of aspirin: Secondary | ICD-10-CM | POA: Diagnosis not present

## 2023-02-13 DIAGNOSIS — E11649 Type 2 diabetes mellitus with hypoglycemia without coma: Secondary | ICD-10-CM | POA: Diagnosis not present

## 2023-02-13 DIAGNOSIS — F17210 Nicotine dependence, cigarettes, uncomplicated: Secondary | ICD-10-CM | POA: Insufficient documentation

## 2023-02-13 DIAGNOSIS — R55 Syncope and collapse: Principal | ICD-10-CM

## 2023-02-13 DIAGNOSIS — E162 Hypoglycemia, unspecified: Secondary | ICD-10-CM

## 2023-02-13 LAB — RAPID URINE DRUG SCREEN, HOSP PERFORMED
Amphetamines: NOT DETECTED
Barbiturates: NOT DETECTED
Benzodiazepines: NOT DETECTED
Cocaine: NOT DETECTED
Opiates: NOT DETECTED
Tetrahydrocannabinol: NOT DETECTED

## 2023-02-13 LAB — URINALYSIS, ROUTINE W REFLEX MICROSCOPIC
Bacteria, UA: NONE SEEN
Bilirubin Urine: NEGATIVE
Glucose, UA: >=500 mg/dL — AB
Hgb urine dipstick: NEGATIVE
Ketones, ur: NEGATIVE mg/dL
Leukocytes,Ua: NEGATIVE
Nitrite: NEGATIVE
Protein, ur: NEGATIVE mg/dL
Specific Gravity, Urine: 1.013 (ref 1.005–1.030)
pH: 5 (ref 5.0–8.0)

## 2023-02-13 LAB — BASIC METABOLIC PANEL
Anion gap: 10 (ref 5–15)
BUN: 12 mg/dL (ref 8–23)
CO2: 21 mmol/L — ABNORMAL LOW (ref 22–32)
Calcium: 8.5 mg/dL — ABNORMAL LOW (ref 8.9–10.3)
Chloride: 111 mmol/L (ref 98–111)
Creatinine, Ser: 0.66 mg/dL (ref 0.61–1.24)
GFR, Estimated: >60 mL/min (ref >60)
Glucose, Bld: 36 mg/dL — CL (ref 70–99)
Potassium: 3.2 mmol/L — ABNORMAL LOW (ref 3.5–5.1)
Sodium: 142 mmol/L (ref 135–145)

## 2023-02-13 LAB — CBC
HCT: 43.8 % (ref 39.0–52.0)
Hemoglobin: 14.4 g/dL (ref 13.0–17.0)
MCH: 30.8 pg (ref 26.0–34.0)
MCHC: 32.9 g/dL (ref 30.0–36.0)
MCV: 93.8 fL (ref 80.0–100.0)
Platelets: 186 10*3/uL (ref 150–400)
RBC: 4.67 MIL/uL (ref 4.22–5.81)
RDW: 17.2 % — ABNORMAL HIGH (ref 11.5–15.5)
WBC: 6.1 10*3/uL (ref 4.0–10.5)
nRBC: 0 % (ref 0.0–0.2)

## 2023-02-13 LAB — CBG MONITORING, ED
Glucose-Capillary: 55 mg/dL — ABNORMAL LOW (ref 70–99)
Glucose-Capillary: 30 mg/dL — CL (ref 70–99)
Glucose-Capillary: 88 mg/dL (ref 70–99)

## 2023-02-13 LAB — GLUCOSE, CAPILLARY
Glucose-Capillary: 198 mg/dL — ABNORMAL HIGH (ref 70–99)
Glucose-Capillary: 298 mg/dL — ABNORMAL HIGH (ref 70–99)

## 2023-02-13 LAB — RESP PANEL BY RT-PCR (RSV, FLU A&B, COVID)  RVPGX2
Influenza A by PCR: NEGATIVE
Influenza B by PCR: NEGATIVE
Resp Syncytial Virus by PCR: NEGATIVE
SARS Coronavirus 2 by RT PCR: NEGATIVE

## 2023-02-13 LAB — ETHANOL: Alcohol, Ethyl (B): <10 mg/dL (ref <10)

## 2023-02-13 MED ORDER — DEXTROSE 50 % IV SOLN
1.0000 | Freq: Once | INTRAVENOUS | Status: AC
  Administered 2023-02-13: 50 mL via INTRAVENOUS

## 2023-02-13 MED ORDER — THIAMINE HCL 100 MG/ML IJ SOLN
100.0000 mg | Freq: Every day | INTRAMUSCULAR | Status: DC
  Filled 2023-02-13: qty 2

## 2023-02-13 MED ORDER — DEXTROSE 10 % IV SOLN: INTRAVENOUS | Status: DC

## 2023-02-13 MED ORDER — FOLIC ACID 1 MG PO TABS
1.0000 mg | ORAL_TABLET | Freq: Every day | ORAL | Status: DC
  Administered 2023-02-13 – 2023-02-14 (×2): 1 mg via ORAL
  Filled 2023-02-13 (×2): qty 1

## 2023-02-13 MED ORDER — SODIUM CHLORIDE 0.9 % IV BOLUS
1000.0000 mL | Freq: Once | INTRAVENOUS | Status: AC
  Administered 2023-02-13: 1000 mL via INTRAVENOUS

## 2023-02-13 MED ORDER — ENOXAPARIN SODIUM 40 MG/0.4ML IJ SOSY
40.0000 mg | PREFILLED_SYRINGE | INTRAMUSCULAR | Status: DC
  Administered 2023-02-13: 40 mg via SUBCUTANEOUS
  Filled 2023-02-13: qty 0.4

## 2023-02-13 MED ORDER — POTASSIUM CHLORIDE CRYS ER 20 MEQ PO TBCR
40.0000 meq | EXTENDED_RELEASE_TABLET | Freq: Once | ORAL | Status: AC
  Administered 2023-02-13: 40 meq via ORAL
  Filled 2023-02-13: qty 2

## 2023-02-13 MED ORDER — ACETAMINOPHEN 650 MG RE SUPP: 650.0000 mg | Freq: Four times a day (QID) | RECTAL | Status: DC | PRN

## 2023-02-13 MED ORDER — THIAMINE MONONITRATE 100 MG PO TABS
100.0000 mg | ORAL_TABLET | Freq: Every day | ORAL | Status: DC
  Administered 2023-02-13 – 2023-02-14 (×2): 100 mg via ORAL
  Filled 2023-02-13 (×2): qty 1

## 2023-02-13 MED ORDER — DEXTROSE 50 % IV SOLN
INTRAVENOUS | Status: AC
  Administered 2023-02-13: 1 via INTRAVENOUS
  Filled 2023-02-13: qty 50

## 2023-02-13 MED ORDER — ONDANSETRON HCL 4 MG/2ML IJ SOLN: 4.0000 mg | Freq: Four times a day (QID) | INTRAMUSCULAR | Status: DC | PRN

## 2023-02-13 MED ORDER — DEXTROSE 50 % IV SOLN: 1.0000 | Freq: Once | INTRAVENOUS | Status: AC

## 2023-02-13 MED ORDER — DEXTROSE 50 % IV SOLN
INTRAVENOUS | Status: AC
  Filled 2023-02-13: qty 50

## 2023-02-13 MED ORDER — SODIUM CHLORIDE 0.9 % IV BOLUS: 500.0000 mL | Freq: Once | INTRAVENOUS | Status: DC

## 2023-02-13 MED ORDER — ONDANSETRON HCL 4 MG PO TABS
4.0000 mg | ORAL_TABLET | Freq: Four times a day (QID) | ORAL | Status: DC | PRN
Start: 2023-02-13 — End: 2023-02-14

## 2023-02-13 MED ORDER — CLOPIDOGREL BISULFATE 75 MG PO TABS
75.0000 mg | ORAL_TABLET | Freq: Every day | ORAL | Status: DC
Start: 2023-02-14 — End: 2023-02-14
  Administered 2023-02-14: 75 mg via ORAL
  Filled 2023-02-13: qty 1

## 2023-02-13 MED ORDER — LORAZEPAM 1 MG PO TABS
1.0000 mg | ORAL_TABLET | ORAL | Status: DC | PRN
  Administered 2023-02-14: 1 mg via ORAL
  Filled 2023-02-13: qty 1

## 2023-02-13 MED ORDER — ADULT MULTIVITAMIN W/MINERALS CH
1.0000 | ORAL_TABLET | Freq: Every day | ORAL | Status: DC
  Administered 2023-02-13 – 2023-02-14 (×2): 1 via ORAL
  Filled 2023-02-13 (×2): qty 1

## 2023-02-13 MED ORDER — LORAZEPAM 2 MG/ML IJ SOLN: 1.0000 mg | INTRAMUSCULAR | Status: DC | PRN

## 2023-02-13 MED ORDER — ACETAMINOPHEN 325 MG PO TABS
650.0000 mg | ORAL_TABLET | Freq: Four times a day (QID) | ORAL | Status: DC | PRN
  Administered 2023-02-14: 650 mg via ORAL
  Filled 2023-02-13: qty 2

## 2023-02-13 NOTE — ED Provider Notes (Signed)
Received patient at signout from previous provider pending labs, chest x-ray, reassessment.  Please see his note.  In short, patient presents to emergency department for evaluation near-syncope following attempting to get up from a chair.  It is noted that he may be inconsistent with his medications at home and has had EtOH this morning.  This morning CBG was in 300s so pt took 30 units short and 30 units long acting insulin.  ED workup is unremarkable.  EKG NSR.  Chest x-ray negative.  While in the emergency department, CBG decreased to 55 following administration of insulin.  Patient is fully alert and oriented in room.  Nursing reports that patient ate and was provided a D50 amp.  However, 2 hours later CBG was noted to be 30.  He is still oriented and able to answer questions appropriately however occasionally requires gentle physical stimuli to wake him up as he is increasingly tired.  Another D50 amp and D10 drip was provided.Patient had some soft Bps in low 90s and was given an additional IVF. Due to CBG continuing to decrease despite D50 and eating, hospitalist Dr. Georgiana Spinner consulted and accepted patient for admission for observation of CBG.   Judithann Sheen, PA 02/13/23 1821    Tegeler, Canary Brim, MD 02/14/23 [phone]

## 2023-02-13 NOTE — ED Triage Notes (Signed)
Pt BIB [facility] from home d/t a near syncopal episode. Per EMS, pt check his blood sugar this morning, and it was reading high. SO pt took his usual insulin dose of 30units of short acting and 30u long acting. Blood sugar came down from 350s to 108. Pt started feeling dizzy and almost passed out. Pt's initial BP was in the 70s, came back up after received of fluids. A7O X4.

## 2023-02-13 NOTE — ED Notes (Signed)
Cbg 55. BP 82/62. MD made aware.

## 2023-02-13 NOTE — ED Notes (Signed)
Pt's CBG dropped down to 30. PA made aware. Given food and drink.

## 2023-02-13 NOTE — H&P (Signed)
[facility] History and Physical  Taimur Feicht ZOX:[id number] DOB: 26-Mar-1960 DOA: 02/13/2023   PCP[facility]  Specialists: Followed by Dr. Loleta Dicker with cardiology  Chief Complaint: Almost passed out  HPI: Baylan Retterer is a 62 y.o. male with a past medical history of insulin-dependent diabetes, coronary artery disease, essential hypertension, alcohol abuse who was brought in by EMS after patient had a near syncopal episode at home.  Patient is very somnolent currently so history is limited.  Most of the information was obtained from the ED provider notes and from triage notes.  It appears that patient checked his glucose levels this morning and his reading was high.  He proceeded to take 30 units of short acting as well as 30 units of his long-acting insulin.  His blood sugar came down from 300s to 100's.  He felt dizzy and almost passed out.  Initially his blood pressure was in the 70s systolic.  He was given fluid bolus by EMS and his blood pressure responded.  Patient is able to tell me that he did not have any chest pain or shortness of breath during this episode.  Denies any chest pain currently.    In the emergency department he was found to have a glucose level initially of 55 and then subsequently 30.  He was given D50 and then had to be started on a D10 infusion.  Blood work only showed mild hypokalemia.  Due to his persistent hypoglycemia he is thought to require hospitalization.    Home Medications: This list is not reconciled yet. Prior to Admission medications   Medication Sig Start Date End Date Taking? Authorizing Provider  acetaminophen (TYLENOL) 325 MG tablet Take 2 tablets (650 mg total) by mouth every 6 (six) hours as needed for mild pain (or Fever >/= 101). 06/20/22   Elgergawy, Leana Roe, MD  aspirin EC 81 MG tablet Take 81 mg by mouth in the morning.    [provider]  atorvastatin (LIPITOR) 80 MG tablet Take 1 tablet (80 mg total) by  mouth at bedtime. 08/30/21   Wendall Stade, MD  clopidogrel (PLAVIX) 75 MG tablet Take 75 mg by mouth daily.    [provider]  cyclobenzaprine (FLEXERIL) 10 MG tablet Take 10 mg by mouth 3 (three) times daily as needed for muscle spasms. 03/29/20   [provider]  dicyclomine (BENTYL) 20 MG tablet Take 20 mg by mouth in the morning and at bedtime.    [provider]  docusate sodium (COLACE) 100 MG capsule Take 100 mg by mouth daily as needed for mild constipation.    [provider]  doxycycline (VIBRAMYCIN) 100 MG capsule Take 1 capsule (100 mg total) by mouth 2 (two) times daily. 10/06/22   Jeanelle Malling, PA  empagliflozin (JARDIANCE) 25 MG TABS tablet Take 1 tablet (25 mg total) by mouth daily. Patient taking differently: Take 12.5 mg by mouth daily. 08/30/21   Wendall Stade, MD  ezetimibe (ZETIA) 10 MG tablet Take 10 mg by mouth daily.    [provider]  folic acid (FOLVITE) 1 MG tablet Take 1 tablet (1 mg total) by mouth daily. 10/11/19   Rodolph Bong, MD  furosemide (LASIX) 40 MG tablet Take 1 tablet (40 mg total) by mouth daily. 06/23/22   Elgergawy, Leana Roe, MD  gabapentin (NEURONTIN) 300 MG capsule Take 300-600 mg by mouth See admin instructions. Take 1 capsule (300 mg) by mouth in the morning, take 1  capsule (300 mg) by mouth at lunch, & take 2 capsules (600 mg) by mouth at bedtime    [provider]  insulin aspart (NOVOLOG) 100 UNIT/ML injection Inject 10 Units into the skin 3 (three) times daily with meals. Patient taking differently: Inject 20 Units into the skin 3 (three) times daily with meals. Sliding Scale Insulin 07/19/21   Evlyn Kanner, MD  insulin glargine (LANTUS) 100 UNIT/ML Solostar Pen 30units in am and 35unit at night  Further adjustment per your pcp and endocrinology Goal of a1c is less than 7%, currently your a1c is 10%, not at goal Patient taking differently: Inject 30 Units into the skin 2 (two) times daily.  Further adjustment per your pcp and endocrinology Goal of a1c is less than 7%, currently your a1c is 10%, not at goal 06/03/21   Albertine Grates, MD  Insulin Pen Needle (NOVOFINE) 30G X 8 MM MISC Inject 10 each into the skin as needed. 10/10/19   Rodolph Bong, MD  lipase/protease/amylase (CREON) 36000 UNITS CPEP capsule Take 1 capsule (36,000 Units total) by mouth 3 (three) times daily before meals. 06/17/19   Laverna Peace, MD  lurasidone (LATUDA) 40 MG TABS tablet Take 40 mg by mouth every evening.    [provider]  magnesium oxide (MAG-OX) 400 (241.3 Mg) MG tablet Take 1 tablet (400 mg total) by mouth 2 (two) times daily. 03/18/20   Burnadette Pop, MD  metoprolol succinate (TOPROL XL) 25 MG 24 hr tablet Take 1 tablet (25 mg total) by mouth daily. 06/22/22 07/22/22  Elgergawy, Leana Roe, MD  Multiple Vitamin (QUINTABS) TABS Take 1 tablet by mouth daily.    [provider]  naloxone [name]) nasal spray 4 mg/0.1 mL Place 1 spray into the nose as needed (opioid reversal).    [provider]  naltrexone (DEPADE) 50 MG tablet Take 50 mg by mouth daily.    [provider]  nitroGLYCERIN (NITROSTAT) 0.4 MG SL tablet Place 1 tablet (0.4 mg total) under the tongue every 5 (five) minutes as needed for chest pain. 07/19/21   Evlyn Kanner, MD  ondansetron (ZOFRAN-ODT) 4 MG disintegrating tablet Take 4 mg by mouth every 6 (six) hours as needed for nausea. 06/06/22   [provider]  pantoprazole (PROTONIX) 40 MG tablet Take 40 mg by mouth in the morning. 10/10/20   [provider]  polyethylene glycol (MIRALAX / GLYCOLAX) 17 g packet Take 17 g by mouth daily as needed for mild constipation. 03/18/20   Burnadette Pop, MD  potassium chloride SA (KLOR-CON M) 20 MEQ tablet Take 1 tablet (20 mEq total) by mouth daily. Patient taking differently: Take 20 mEq by mouth daily after lunch. 08/31/21   Manson Passey, PA  ranolazine (RANEXA) 500 MG 12 hr tablet Take  1 tablet by mouth daily at 6 (six) AM. 09/27/21   [provider]  sacubitril-valsartan (ENTRESTO) 24-26 MG Take 1 tablet by mouth 2 (two) times daily. 06/25/22   Elgergawy, Leana Roe, MD  thiamine 100 MG tablet Take 1 tablet (100 mg total) by mouth daily. 10/11/19   Rodolph Bong, MD  vitamin B-12 (CYANOCOBALAMIN) 500 MCG tablet Take 500 mcg by mouth in the morning. 01/20/20   [provider]    Allergies:  Allergies  Allergen Reactions   Iohexol Hives    Patient broke out in hives after injection of Omni 300, will need 13 hour pre-med in future   Ozempic (0.25 Or 0.5 Mg-Dose) [Semaglutide(0.25 Or 0.5mg -Dos)]  Other (See Comments)    pancreatitis   Shellfish Allergy Hives   Milk (Cow) Diarrhea    Finding of gastrointestinal tract gas   Zestril [Lisinopril] Cough   Zocor [Simvastatin] Itching and Cough    Past Medical History: Past Medical History:  Diagnosis Date   Alcohol abuse 06/14/2019   Anxiety    Back pain    Cataracts, bilateral    CHF (congestive heart failure) (HCC)    Depression    Diabetic retinopathy (HCC)    Essential hypertension 06/14/2019   Hepatitis C    cured   Hx of BKA, right (HCC)    Hyperlipidemia    Hypertension    Mixed hyperlipidemia due to type 2 diabetes mellitus (HCC) 07/31/2019   Nicotine dependence, cigarettes, uncomplicated 07/31/2019   PAD (peripheral artery disease) (HCC)    Pancreatitis    PTSD (post-traumatic stress disorder)    Uncontrolled type 2 diabetes mellitus with diabetic polyneuropathy, with long-term current use of insulin 07/31/2019    Past Surgical History:  Procedure Laterality Date   ABDOMINAL AORTOGRAM W/LOWER EXTREMITY N/A 12/02/2020   Procedure: ABDOMINAL AORTOGRAM W/LOWER EXTREMITY;  Surgeon: Leonie Douglas, MD;  Location: [facility];  Service: Cardiovascular;  Laterality: N/A;   AMPUTATION Right 03/13/2020   Procedure: TRANSMETATARSAL AMPUTATION;  Surgeon: Chuck Hint, MD;   Location: [facility] OR;  Service: Vascular;  Laterality: Right;   AMPUTATION Right 03/15/2020   Procedure: RIGHT BELOW KNEE AMPUTATION;  Surgeon: Chuck Hint, MD;  Location: [facility] OR;  Service: Vascular;  Laterality: Right;   CORONARY ANGIOPLASTY WITH STENT PLACEMENT     CORONARY STENT INTERVENTION N/A 07/16/2021   Procedure: CORONARY STENT INTERVENTION;  Surgeon: Orbie Pyo, MD;  Location: [facility];  Service: Cardiovascular;  Laterality: N/A;   LEFT HEART CATH AND CORONARY ANGIOGRAPHY N/A 07/16/2021   Procedure: LEFT HEART CATH AND CORONARY ANGIOGRAPHY;  Surgeon: Orbie Pyo, MD;  Location: [facility];  Service: Cardiovascular;  Laterality: N/A;   PERIPHERAL VASCULAR INTERVENTION  12/02/2020   Procedure: PERIPHERAL VASCULAR INTERVENTION;  Surgeon: Leonie Douglas, MD;  Location: [facility];  Service: Cardiovascular;;  Lt SFA, LEIA    Social History: Lives with his wife.  Smokes 2 cigarettes on a daily basis.  States that he drinks about 3 beers on a daily basis.  Family History:  Unable to obtain due to his somnolence  Review of Systems -unable to do due to his somnolence  Physical Examination  Vitals:   02/13/23 1600 02/13/23 1615 02/13/23 1715 02/13/23 1730  BP: 110/77 130/76 121/70 93/63  Pulse: 72 76 76 69  Resp: 15 20 19 14   Temp:      TempSrc:      SpO2: 98% 97% 92% 90%    BP 93/63   Pulse 69   Temp 97.7 F (36.5 C) (Oral)   Resp 14   SpO2 90%   General appearance: Somnolent but arousable.  In no distress Head: Normocephalic, without obvious abnormality, atraumatic Eyes: conjunctivae/corneas clear. PERRL, EOM's intact.  Throat: lips, mucosa, and tongue normal; teeth and gums normal Neck: no adenopathy, no carotid bruit, no JVD, supple, symmetrical, trachea midline, and thyroid not enlarged, symmetric, no tenderness/mass/nodules Resp: clear to auscultation bilaterally Cardio: regular rate and rhythm, S1, S2 normal, no murmur,  click, rub or gallop GI: soft, non-tender; bowel sounds normal; no masses,  no organomegaly Extremities: extremities normal, atraumatic, no cyanosis or edema Pulses: 2+ and symmetric  Skin: Skin color, texture, turgor normal. No rashes or lesions Lymph nodes: Cervical, supraclavicular, and axillary nodes normal. Neurologic: Cranial nerves II to XII intact.  Tongue is midline.  No pronator drift.  No focal motor deficits noted.  He does have generalized weakness.   Labs on Admission: I have personally reviewed following labs and imaging studies  CBC: Recent Labs  Lab 02/13/23 1512  WBC 6.1  HGB 14.4  HCT 43.8  MCV 93.8  PLT 186   Basic Metabolic Panel: Recent Labs  Lab 02/13/23 1702  NA 142  K 3.2*  CL 111  CO2 21*  GLUCOSE 36*  BUN 12  CREATININE 0.66  CALCIUM 8.5*   GFR: CrCl cannot be calculated (Unknown ideal weight.).  CBG: Recent Labs  Lab 02/13/23 1502 02/13/23 1711 02/13/23 1809  GLUCAP 55* 30* 88     Radiological Exams on Admission: DG Chest 2 View Result Date: 02/13/2023 CLINICAL DATA:  Cough.  Near syncopal episode. EXAM: CHEST - 2 VIEW COMPARISON:  06/19/2022 FINDINGS: Heart size remains at the upper limits of normal. Both lungs are clear. The visualized skeletal structures are unremarkable. IMPRESSION: No active cardiopulmonary disease. Electronically Signed   By: Danae Orleans M.D.   On: 02/13/2023 16:32    My interpretation of Electrocardiogram: Sinus rhythm in the 70s.  Normal axis.  Intervals appear to be normal.  No concerning ST or T wave changes are noted.   Problem List  Principal Problem:   Hypoglycemia due to insulin Active Problems:   Diabetes mellitus with diabetic neuropathy, with long-term current use of insulin (HCC)   Alcohol abuse   Essential hypertension   Tobacco abuse   Chronic combined systolic and diastolic CHF (congestive heart failure) (HCC)   Near syncope   CAD S/P percutaneous coronary angioplasty   Assessment:  This is a 62 year old male with past medical history as stated earlier who presents after sustaining a near syncopal episode at home which appears to be due to hypoglycemia with possibly a vasovagal component.  He does not have any focal neurological deficits.  EKG does not show anything acute.  Chest x-ray was unremarkable.  Hypoglycemia likely induced by excessive insulin use.  Plan:  #1. Near syncope likely due to hypoglycemia from possibly excessive insulin use: Will observe him in progressive unit for now.  Agree with D10 infusion till his blood glucose levels stabilize.  Due to his history of CHF we will avoid giving him excessive volume.  Will check HbA1c level.  HbA1c was 9.6 in May.  UA and urine drug screen is pending.  COVID-19 influenza and RSV PCR's were negative.  Does not appear to be on any narcotics at home.  #2. Hypotension: Blood pressures have improved with fluid bolus.  Monitor closely.  Hold any antihypertensives for now.  Do not suspect sepsis at this time.  May need further workup if blood pressures drop again.  #3. Diabetes mellitus type 2, insulin-dependent: See above.  #4. Coronary artery disease: Stable.  No ischemic changes on EKG.  Patient's medication list is not reconciled yet but appears that he might be on Plavix which will be resumed from tomorrow morning.  Will hold off on his other medications for now as he does have borderline low blood pressures.  #5. Chronic combined systolic and diastolic CHF: Echo from May 2023 shows that he had a LVEF of 45 to 50%.  Does not appear to be volume overloaded at this time.  His medication list suggest that  he might be on Entresto but this list is not reconciled yet.  Will wait till medications are confirmed.  In any case we will hold any blood pressure lowering agents for now.  #6. Hypokalemia: Will be repleted.  Check magnesium levels.  #7. History of alcohol abuse: Mentions that he drinks about 3 beers on a daily basis.   Placed him on CIWA protocol just with as needed medications.  Thiamine multivitamins folic acid will be ordered.     DVT Prophylaxis: Lovenox Code Status: Full code Family Communication: No family at bedside Disposition: Hopefully return home when improved Consults called: None Admission Status: Status is: Observation The patient remains OBS appropriate and will d/c before 2 midnights.    Severity of Illness: The appropriate patient status for this patient is OBSERVATION. Observation status is judged to be reasonable and necessary in order to provide the required intensity of service to ensure the patient's safety. The patient's presenting symptoms, physical exam findings, and initial radiographic and laboratory data in the context of their medical condition is felt to place them at decreased risk for further clinical deterioration. Furthermore, it is anticipated that the patient will be medically stable for discharge from the hospital within 2 midnights of admission.    Further management decisions will depend on results of further testing and patient's response to treatment.   Donnavin Vandenbrink [facility]  [facility] [employer] on [email or web address]  02/13/2023, 6:19 PM

## 2023-02-13 NOTE — ED Provider Notes (Signed)
[place] EMERGENCY DEPARTMENT AT [facility] Provider Note   CSN: [id number] Arrival date & time: 02/13/23  1440     History  Chief Complaint  Patient presents with   Near Syncope    Ricardo Howard is a 62 y.o. male.  The history is provided by the patient, the EMS personnel and medical records. No language interpreter was used.  Near Syncope     62 year old male significant history of alcohol abuse, insulin-dependent diabetes, tobacco use, cardiac disease, CHF, right BKA brought here via EMS from home for a near syncopal episode.  Per EMS, family called out because patient was getting up from a sitting position and nearly passed out.  Patient states that he is overall feeling a little bit lightheadedness and weak.  He mention this morning when he checked his blood sugar it was reading high.  He then took his short acting and long-acting insulin medication and while resting as soon as he stood up he felt like it was going up passed out and endorsed lightheadedness.  He did not fall to the ground or hitting his head.  He mentions since then he is feeling a little better.  Does not endorse any active chest pain trouble breathing abdominal pain focal numbness or focal weakness.  He does endorse having peripheral neuropathy and admits to taking gabapentin.  He is unsure if he is taking more than usual.  Family mention he does not use his insulin on a regular basis.  EMS report that patient initially had a CBG reading at 350s and after taking 30 units of his short acting insulin as well as 30 minutes of his long-acting insulin, his blood sugar did improve from the 350s to 108.  When EMS initially arrived, patient's blood pressure was 70 systolic.  He was given 1500 mL of normal saline with improvement of his blood pressure.  Furthermore patient also endorsed having an ongoing cough for the past several days.  Cough is nonproductive and patient states it does not feel like a cold.   Patient admits to drinking "1 beer this morning".  Family normal observe patient drinking several beers daily.  Patient denies any significant tobacco use except for 1 to 2 cigarettes daily.  Home Medications Prior to Admission medications   Medication Sig Start Date End Date Taking? Authorizing Provider  acetaminophen (TYLENOL) 325 MG tablet Take 2 tablets (650 mg total) by mouth every 6 (six) hours as needed for mild pain (or Fever >/= 101). 06/20/22   Elgergawy, Leana Roe, MD  aspirin EC 81 MG tablet Take 81 mg by mouth in the morning.    [provider]  atorvastatin (LIPITOR) 80 MG tablet Take 1 tablet (80 mg total) by mouth at bedtime. 08/30/21   Wendall Stade, MD  clopidogrel (PLAVIX) 75 MG tablet Take 75 mg by mouth daily.    [provider]  cyclobenzaprine (FLEXERIL) 10 MG tablet Take 10 mg by mouth 3 (three) times daily as needed for muscle spasms. 03/29/20   [provider]  dicyclomine (BENTYL) 20 MG tablet Take 20 mg by mouth in the morning and at bedtime.    [provider]  docusate sodium (COLACE) 100 MG capsule Take 100 mg by mouth daily as needed for mild constipation.    [provider]  doxycycline (VIBRAMYCIN) 100 MG capsule Take 1 capsule (100 mg total) by mouth 2 (two) times daily. 10/06/22   Jeanelle Malling, PA  empagliflozin (JARDIANCE) 25 MG  TABS tablet Take 1 tablet (25 mg total) by mouth daily. Patient taking differently: Take 12.5 mg by mouth daily. 08/30/21   Wendall Stade, MD  ezetimibe (ZETIA) 10 MG tablet Take 10 mg by mouth daily.    [provider]  folic acid (FOLVITE) 1 MG tablet Take 1 tablet (1 mg total) by mouth daily. 10/11/19   Rodolph Bong, MD  furosemide (LASIX) 40 MG tablet Take 1 tablet (40 mg total) by mouth daily. 06/23/22   Elgergawy, Leana Roe, MD  gabapentin (NEURONTIN) 300 MG capsule Take 300-600 mg by mouth See admin instructions. Take 1 capsule (300 mg) by mouth in the morning, take 1 capsule (300 mg)  by mouth at lunch, & take 2 capsules (600 mg) by mouth at bedtime    [provider]  insulin aspart (NOVOLOG) 100 UNIT/ML injection Inject 10 Units into the skin 3 (three) times daily with meals. Patient taking differently: Inject 20 Units into the skin 3 (three) times daily with meals. Sliding Scale Insulin 07/19/21   Evlyn Kanner, MD  insulin glargine (LANTUS) 100 UNIT/ML Solostar Pen 30units in am and 35unit at night  Further adjustment per your pcp and endocrinology Goal of a1c is less than 7%, currently your a1c is 10%, not at goal Patient taking differently: Inject 30 Units into the skin 2 (two) times daily. Further adjustment per your pcp and endocrinology Goal of a1c is less than 7%, currently your a1c is 10%, not at goal 06/03/21   Albertine Grates, MD  Insulin Pen Needle (NOVOFINE) 30G X 8 MM MISC Inject 10 each into the skin as needed. 10/10/19   Rodolph Bong, MD  lipase/protease/amylase (CREON) 36000 UNITS CPEP capsule Take 1 capsule (36,000 Units total) by mouth 3 (three) times daily before meals. 06/17/19   Laverna Peace, MD  lurasidone (LATUDA) 40 MG TABS tablet Take 40 mg by mouth every evening.    [provider]  magnesium oxide (MAG-OX) 400 (241.3 Mg) MG tablet Take 1 tablet (400 mg total) by mouth 2 (two) times daily. 03/18/20   Burnadette Pop, MD  metoprolol succinate (TOPROL XL) 25 MG 24 hr tablet Take 1 tablet (25 mg total) by mouth daily. 06/22/22 07/22/22  Elgergawy, Leana Roe, MD  Multiple Vitamin (QUINTABS) TABS Take 1 tablet by mouth daily.    [provider]  naloxone [facility]) nasal spray 4 mg/0.1 mL Place 1 spray into the nose as needed (opioid reversal).    [provider]  naltrexone (DEPADE) 50 MG tablet Take 50 mg by mouth daily.    [provider]  nitroGLYCERIN (NITROSTAT) 0.4 MG SL tablet Place 1 tablet (0.4 mg total) under the tongue every 5 (five) minutes as needed for chest pain. 07/19/21   Evlyn Kanner, MD   ondansetron (ZOFRAN-ODT) 4 MG disintegrating tablet Take 4 mg by mouth every 6 (six) hours as needed for nausea. 06/06/22   [provider]  pantoprazole (PROTONIX) 40 MG tablet Take 40 mg by mouth in the morning. 10/10/20   [provider]  polyethylene glycol (MIRALAX / GLYCOLAX) 17 g packet Take 17 g by mouth daily as needed for mild constipation. 03/18/20   Burnadette Pop, MD  potassium chloride SA (KLOR-CON M) 20 MEQ tablet Take 1 tablet (20 mEq total) by mouth daily. Patient taking differently: Take 20 mEq by mouth daily after lunch. 08/31/21   Manson Passey, PA  ranolazine (RANEXA) 500 MG 12 hr tablet Take 1 tablet by mouth daily  at 6 (six) AM. 09/27/21   [provider]  sacubitril-valsartan (ENTRESTO) 24-26 MG Take 1 tablet by mouth 2 (two) times daily. 06/25/22   Elgergawy, Leana Roe, MD  thiamine 100 MG tablet Take 1 tablet (100 mg total) by mouth daily. 10/11/19   Rodolph Bong, MD  vitamin B-12 (CYANOCOBALAMIN) 500 MCG tablet Take 500 mcg by mouth in the morning. 01/20/20   [provider]      Allergies    Iohexol, Ozempic (0.25 or 0.5 mg-dose) [semaglutide(0.25 or 0.5mg -dos)], Shellfish allergy, Milk (cow), Zestril [lisinopril], and Zocor [simvastatin]    Review of Systems   Review of Systems  Cardiovascular:  Positive for near-syncope.  All other systems reviewed and are negative.   Physical Exam Updated Vital Signs BP 90/63 (BP Location: Right Arm)   Pulse 76   Temp 97.7 F (36.5 C) (Oral)   Resp 17   SpO2 96%  Physical Exam Vitals and nursing note reviewed.  Constitutional:      General: He is not in acute distress.    Appearance: He is well-developed.  HENT:     Head: Atraumatic.     Mouth/Throat:     Mouth: Mucous membranes are dry.  Eyes:     Conjunctiva/sclera: Conjunctivae normal.  Cardiovascular:     Rate and Rhythm: Normal rate and regular rhythm.     Pulses: Normal pulses.     Heart sounds: Normal heart sounds.   Pulmonary:     Effort: Pulmonary effort is normal.     Breath sounds: Normal breath sounds.  Abdominal:     Palpations: Abdomen is soft.     Tenderness: There is no abdominal tenderness.  Musculoskeletal:        General: Normal range of motion.     Cervical back: Neck supple.     Comments: Able to move all 4 extremities with equal effort.  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  Skin:    Findings: No rash.  Neurological:     Mental Status: He is alert. Mental status is at baseline.     ED Results / Procedures / Treatments   Labs (all labs ordered are listed, but only abnormal results are displayed) Labs Reviewed  CBG MONITORING, ED - Abnormal; Notable for the following components:      Result Value   Glucose-Capillary 55 (*)    All other components within normal limits  RESP PANEL BY RT-PCR (RSV, FLU A&B, COVID)  [id number]  BASIC METABOLIC PANEL  CBC  URINALYSIS, ROUTINE W REFLEX MICROSCOPIC  RAPID URINE DRUG SCREEN, HOSP PERFORMED  ETHANOL    EKG None  Date: 02/13/2023  Rate: 75  Rhythm: normal sinus rhythm  QRS Axis: normal  Intervals: normal  ST/T Wave abnormalities: normal  Conduction Disutrbances: none  Narrative Interpretation:   Old EKG Reviewed: No significant changes noted    Radiology No results found.  Procedures Procedures    Medications Ordered in ED Medications - No data to display  ED Course/ Medical Decision Making/ A&P                                 Medical Decision Making Amount and/or Complexity of Data Reviewed Labs: ordered. Radiology: ordered.  Risk Prescription drug management.   BP 90/63 (BP Location: Right Arm)   Pulse 76   Temp 97.7 F (36.5 C) (Oral)   Resp 17   SpO2 96%  82:77 PM 62 year old male significant history of alcohol abuse, insulin-dependent diabetes, tobacco use, cardiac disease, CHF, right BKA brought here via EMS from home for a near syncopal episode.  Per EMS, family called out because  patient was getting up from a sitting position and nearly passed out.  Patient states that he is overall feeling a little bit lightheadedness and weak.  He mention this morning when he checked his blood sugar it was reading high.  He then took his short acting and long-acting insulin medication and while resting as soon as he stood up he felt like it was going up passed out and endorsed lightheadedness.  He did not fall to the ground or hitting his head.  He mentions since then he is feeling a little better.  Does not endorse any active chest pain trouble breathing abdominal pain focal numbness or focal weakness.  He does endorse having peripheral neuropathy and admits to taking gabapentin.  He is unsure if he is taking more than usual.  Family mention he does not use his insulin on a regular basis.  Furthermore patient also endorsed having an ongoing cough for the past several days.  Cough is nonproductive and patient states it does not feel like a cold.  Patient admits to drinking "1 beer this morning".  Family normal observe patient drinking several beers daily.  Patient denies any significant tobacco use except for 1 to 2 cigarettes daily.  Patient here for near syncopal episode.  Symptoms likely vasovagal as it was brought on by exertion.  He is without any focal neurodeficit concerning for stroke.  He does appear a bit dry.  He also takes more gabapentin than usual for his neuropathy which may contribute to his symptoms.  Workup initiated, patient signed out to oncoming provider will continue with current management.        Final Clinical Impression(s) / ED Diagnoses Final diagnoses:  None    Rx / DC Orders ED Discharge Orders     None         Fayrene Helper, PA-C 02/13/23 1522    Eber Hong, MD 02/14/23 [phone]

## 2023-02-13 NOTE — ED Notes (Signed)
ED TO INPATIENT HANDOFF REPORT  ED Nurse Name and Phone #: [phone] Nichols Corter Y   S Name/Age/Gender Ricardo Howard 62 y.o. male Room/Bed: TRACC/TRACC  Code Status   Code Status: Full Code  Home/SNF/Other Home Patient oriented to: A&O X4  Is this baseline? Yes   Triage Complete: Triage complete  Chief Complaint Hypoglycemia due to insulin [E16.0, T38.3X5A]  Triage Note Pt BIB [facility] from home d/t a near syncopal episode. Per EMS, pt check his blood sugar this morning, and it was reading high. SO pt took his usual insulin dose of 30units of short acting and 30u long acting. Blood sugar came down from 350s to 108. Pt started feeling dizzy and almost passed out. Pt's initial BP was in the 70s, came back up after received of fluids. A7O X4.    Allergies Allergies  Allergen Reactions   Iohexol Hives    Patient broke out in hives after injection of Omni 300, will need 13 hour pre-med in future   Ozempic (0.25 Or 0.5 Mg-Dose) [Semaglutide(0.25 Or 0.5mg -Dos)] Other (See Comments)    pancreatitis   Shellfish Allergy Hives   Milk (Cow) Diarrhea    Finding of gastrointestinal tract gas   Zestril [Lisinopril] Cough   Zocor [Simvastatin] Itching and Cough    Level of Care/Admitting Diagnosis ED Disposition     ED Disposition  Admit   Condition  --   Comment  Hospital Area: [facility] [[id number]]  Level of Care: Progressive [102]  Admit to Progressive based on following criteria: GI, ENDOCRINE disease patients with GI bleeding, acute liver failure or pancreatitis, stable with diabetic ketoacidosis or thyrotoxicosis (hypothyroid) state.  May place patient in observation at [facility] or [facility] if equivalent level of care is available:: No  Covid Evaluation: Confirmed COVID Negative  Diagnosis: Hypoglycemia due to insulin [403474]  Admitting Physician: Osvaldo Shipper [[id number]]  Attending Physician: Osvaldo Shipper [[id number]]          B Medical/Surgery  History Past Medical History:  Diagnosis Date   Alcohol abuse 06/14/2019   Anxiety    Back pain    Cataracts, bilateral    CHF (congestive heart failure) (HCC)    Depression    Diabetic retinopathy (HCC)    Essential hypertension 06/14/2019   Hepatitis C    cured   Hx of BKA, right (HCC)    Hyperlipidemia    Hypertension    Mixed hyperlipidemia due to type 2 diabetes mellitus (HCC) 07/31/2019   Nicotine dependence, cigarettes, uncomplicated 07/31/2019   PAD (peripheral artery disease) (HCC)    Pancreatitis    PTSD (post-traumatic stress disorder)    Uncontrolled type 2 diabetes mellitus with diabetic polyneuropathy, with long-term current use of insulin 07/31/2019   Past Surgical History:  Procedure Laterality Date   ABDOMINAL AORTOGRAM W/LOWER EXTREMITY N/A 12/02/2020   Procedure: ABDOMINAL AORTOGRAM W/LOWER EXTREMITY;  Surgeon: Leonie Douglas, MD;  Location: [facility] INVASIVE CV LAB;  Service: Cardiovascular;  Laterality: N/A;   AMPUTATION Right 03/13/2020   Procedure: TRANSMETATARSAL AMPUTATION;  Surgeon: Chuck Hint, MD;  Location: [facility] OR;  Service: Vascular;  Laterality: Right;   AMPUTATION Right 03/15/2020   Procedure: RIGHT BELOW KNEE AMPUTATION;  Surgeon: Chuck Hint, MD;  Location: [facility] OR;  Service: Vascular;  Laterality: Right;   CORONARY ANGIOPLASTY WITH STENT PLACEMENT     CORONARY STENT INTERVENTION N/A 07/16/2021   Procedure: CORONARY STENT INTERVENTION;  Surgeon: Orbie Pyo, MD;  Location: [facility] INVASIVE CV  LAB;  Service: Cardiovascular;  Laterality: N/A;   LEFT HEART CATH AND CORONARY ANGIOGRAPHY N/A 07/16/2021   Procedure: LEFT HEART CATH AND CORONARY ANGIOGRAPHY;  Surgeon: Orbie Pyo, MD;  Location: [facility] INVASIVE CV LAB;  Service: Cardiovascular;  Laterality: N/A;   PERIPHERAL VASCULAR INTERVENTION  12/02/2020   Procedure: PERIPHERAL VASCULAR INTERVENTION;  Surgeon: Leonie Douglas, MD;  Location: [facility] INVASIVE CV LAB;  Service:  Cardiovascular;;  Lt SFA, LEIA     A IV Location/Drains/Wounds Patient Lines/Drains/Airways Status     Active Line/Drains/Airways     Name Placement date Placement time Site Days   Peripheral IV 02/13/23 20 G Left;Posterior Forearm 02/13/23  1500  Forearm  less than 1            Intake/Output Last 24 hours No intake or output data in the 24 hours ending 02/13/23 1848  Labs/Imaging Results for orders placed or performed during the hospital encounter of 02/13/23 (from the past 48 hours)  Resp panel by RT-PCR (RSV, Flu A&B, Covid) Anterior Nasal Swab     Status: None   Collection Time: 02/13/23  2:54 PM   Specimen: Anterior Nasal Swab  Result Value Ref Range   SARS Coronavirus 2 by RT PCR NEGATIVE NEGATIVE   Influenza A by PCR NEGATIVE NEGATIVE   Influenza B by PCR NEGATIVE NEGATIVE    Comment: (NOTE) The Xpert Xpress SARS-CoV-2/FLU/RSV plus assay is intended as an aid in the diagnosis of influenza from Nasopharyngeal swab specimens and should not be used as a sole basis for treatment. Nasal washings and aspirates are unacceptable for Xpert Xpress SARS-CoV-2/FLU/RSV testing.  Fact Sheet for Patients: BloggerCourse.com  Fact Sheet for Healthcare Providers: SeriousBroker.it  This test is not yet approved or cleared by the [place] FDA and has been authorized for detection and/or diagnosis of SARS-CoV-2 by FDA under an Emergency Use Authorization (EUA). This EUA will remain in effect (meaning this test can be used) for the duration of the COVID-19 declaration under Section 564(b)(1) of the Act, 21 U.S.C. section 360bbb-3(b)(1), unless the authorization is terminated or revoked.     Resp Syncytial Virus by PCR NEGATIVE NEGATIVE    Comment: (NOTE) Fact Sheet for Patients: BloggerCourse.com  Fact Sheet for Healthcare Providers: SeriousBroker.it  This test is not  yet approved or cleared by the [place] FDA and has been authorized for detection and/or diagnosis of SARS-CoV-2 by FDA under an Emergency Use Authorization (EUA). This EUA will remain in effect (meaning this test can be used) for the duration of the COVID-19 declaration under Section 564(b)(1) of the Act, 21 U.S.C. section 360bbb-3(b)(1), unless the authorization is terminated or revoked.  Performed at [facility], [address], [place], [place] [address]   CBG monitoring, ED     Status: Abnormal   Collection Time: 02/13/23  3:02 PM  Result Value Ref Range   Glucose-Capillary 55 (L) 70 - 99 mg/dL    Comment: Glucose reference range applies only to samples taken after fasting for at least 8 hours.  CBC     Status: Abnormal   Collection Time: 02/13/23  3:12 PM  Result Value Ref Range   WBC 6.1 4.0 - 10.5 K/uL   RBC 4.67 4.22 - 5.81 MIL/uL   Hemoglobin 14.4 13.0 - 17.0 g/dL   HCT 60.4 54.0 - 98.1 %   MCV 93.8 80.0 - 100.0 fL   MCH 30.8 26.0 - 34.0 pg   MCHC 32.9 30.0 - 36.0 g/dL  RDW 17.2 (H) 11.5 - 15.5 %   Platelets 186 150 - 400 K/uL   nRBC 0.0 0.0 - 0.2 %    Comment: Performed at [facility], [address] [place], [place] [address]  Ethanol     Status: None   Collection Time: 02/13/23  3:12 PM  Result Value Ref Range   Alcohol, Ethyl (B) <10 <10 mg/dL    Comment: (NOTE) Lowest detectable limit for serum alcohol is 10 mg/dL.  For medical purposes only. Performed at [facility], [address] [place], [place] [address]   Basic metabolic panel     Status: Abnormal   Collection Time: 02/13/23  5:02 PM  Result Value Ref Range   Sodium 142 135 - 145 mmol/L   Potassium 3.2 (L) 3.5 - 5.1 mmol/L   Chloride 111 98 - 111 mmol/L   CO2 21 (L) 22 - 32 mmol/L   Glucose, Bld 36 (LL) 70 - 99 mg/dL    Comment: CRITICAL RESULT CALLED TO, READ BACK BY AND VERIFIED WITH M. [place] RN @1807  ON 02/13/23 BY MAB Glucose reference range applies only to  samples taken after fasting for at least 8 hours.    BUN 12 8 - 23 mg/dL   Creatinine, Ser 0.98 0.61 - 1.24 mg/dL   Calcium 8.5 (L) 8.9 - 10.3 mg/dL   GFR, Estimated >11 >91 mL/min    Comment: (NOTE) Calculated using the CKD-EPI Creatinine Equation (2021)    Anion gap 10 5 - 15    Comment: Performed at [facility], [address] [place], [place] [address]  CBG monitoring, ED     Status: Abnormal   Collection Time: 02/13/23  5:11 PM  Result Value Ref Range   Glucose-Capillary 30 (LL) 70 - 99 mg/dL    Comment: Glucose reference range applies only to samples taken after fasting for at least 8 hours.   Comment 1 Notify RN   CBG monitoring, ED     Status: None   Collection Time: 02/13/23  6:09 PM  Result Value Ref Range   Glucose-Capillary 88 70 - 99 mg/dL    Comment: Glucose reference range applies only to samples taken after fasting for at least 8 hours.   DG Chest 2 View Result Date: 02/13/2023 CLINICAL DATA:  Cough.  Near syncopal episode. EXAM: CHEST - 2 VIEW COMPARISON:  06/19/2022 FINDINGS: Heart size remains at the upper limits of normal. Both lungs are clear. The visualized skeletal structures are unremarkable. IMPRESSION: No active cardiopulmonary disease. Electronically Signed   By: Danae Orleans M.D.   On: 02/13/2023 16:32    Pending Labs Unresulted Labs (From admission, onward)     Start     Ordered   02/14/23 0500  CBC  Tomorrow morning,   R        02/13/23 1818   02/14/23 0500  Comprehensive metabolic panel  Tomorrow morning,   R        02/13/23 1818   02/14/23 0500  Magnesium  Tomorrow morning,   R        02/13/23 1818   02/14/23 0500  Hemoglobin A1c  Tomorrow morning,   R        02/13/23 1834   02/13/23 1452  Urinalysis, Routine w reflex microscopic -Urine, Clean Catch  Once,   URGENT       Question:  Specimen Source  Answer:  Urine, Clean Catch   02/13/23 1451   02/13/23 1452  Rapid urine drug screen (hospital performed)  ONCE - STAT,   STAT         02/13/23 1451            Vitals/Pain Today's Vitals   02/13/23 1715 02/13/23 1730 02/13/23 1800 02/13/23 1815  BP: 121/70 93/63 100/69 90/68  Pulse: 76 69 69 68  Resp: 19 14 12 11   Temp:      TempSrc:      SpO2: 92% 90% 94% 97%  PainSc:        Isolation Precautions No active isolations  Medications Medications  dextrose 10 % infusion ( Intravenous New Bag/Given 02/13/23 1824)  clopidogrel (PLAVIX) tablet 75 mg (has no administration in time range)  enoxaparin (LOVENOX) injection 40 mg (40 mg Subcutaneous Given 02/13/23 1831)  acetaminophen (TYLENOL) tablet 650 mg (has no administration in time range)    Or  acetaminophen (TYLENOL) suppository 650 mg (has no administration in time range)  ondansetron (ZOFRAN) tablet 4 mg (has no administration in time range)    Or  ondansetron (ZOFRAN) injection 4 mg (has no administration in time range)  LORazepam (ATIVAN) tablet 1-4 mg (has no administration in time range)    Or  LORazepam (ATIVAN) injection 1-4 mg (has no administration in time range)  thiamine (VITAMIN B1) tablet 100 mg (100 mg Oral Given 02/13/23 1831)    Or  thiamine (VITAMIN B1) injection 100 mg ( Intravenous See Alternative 02/13/23 1831)  folic acid (FOLVITE) tablet 1 mg (1 mg Oral Given 02/13/23 1831)  multivitamin with minerals tablet 1 tablet (1 tablet Oral Given 02/13/23 1831)  sodium chloride 0.9 % bolus 500 mL (has no administration in time range)  dextrose 50 % solution 50 mL (1 ampule Intravenous Not Given 02/13/23 1716)  sodium chloride 0.9 % bolus 1,000 mL (0 mLs Intravenous Stopped 02/13/23 1816)  dextrose 50 % solution 50 mL (50 mLs Intravenous Given 02/13/23 1717)  sodium chloride 0.9 % bolus 1,000 mL (1,000 mLs Intravenous New Bag/Given 02/13/23 1826)  potassium chloride SA (KLOR-CON M) CR tablet 40 mEq (40 mEq Oral Given 02/13/23 1831)    Mobility walks with device     Focused Assessments    R Recommendations: See Admitting Provider  Note  Report given to:   Additional Notes:

## 2023-02-14 DIAGNOSIS — T383X5A Adverse effect of insulin and oral hypoglycemic [antidiabetic] drugs, initial encounter: Secondary | ICD-10-CM | POA: Diagnosis not present

## 2023-02-14 DIAGNOSIS — F1010 Alcohol abuse, uncomplicated: Secondary | ICD-10-CM | POA: Diagnosis not present

## 2023-02-14 DIAGNOSIS — R55 Syncope and collapse: Secondary | ICD-10-CM | POA: Diagnosis not present

## 2023-02-14 DIAGNOSIS — Z9861 Coronary angioplasty status: Secondary | ICD-10-CM | POA: Diagnosis not present

## 2023-02-14 DIAGNOSIS — E11649 Type 2 diabetes mellitus with hypoglycemia without coma: Secondary | ICD-10-CM | POA: Diagnosis not present

## 2023-02-14 DIAGNOSIS — E160 Drug-induced hypoglycemia without coma: Secondary | ICD-10-CM | POA: Diagnosis not present

## 2023-02-14 DIAGNOSIS — I2510 Atherosclerotic heart disease of native coronary artery without angina pectoris: Secondary | ICD-10-CM | POA: Diagnosis not present

## 2023-02-14 LAB — COMPREHENSIVE METABOLIC PANEL
ALT: 33 U/L (ref 0–44)
AST: 30 U/L (ref 15–41)
Albumin: 2.6 g/dL — ABNORMAL LOW (ref 3.5–5.0)
Alkaline Phosphatase: 58 U/L (ref 38–126)
Anion gap: 9 (ref 5–15)
BUN: 9 mg/dL (ref 8–23)
CO2: 21 mmol/L — ABNORMAL LOW (ref 22–32)
Calcium: 8.4 mg/dL — ABNORMAL LOW (ref 8.9–10.3)
Chloride: 109 mmol/L (ref 98–111)
Creatinine, Ser: 0.63 mg/dL (ref 0.61–1.24)
GFR, Estimated: >60 mL/min (ref >60)
Glucose, Bld: 172 mg/dL — ABNORMAL HIGH (ref 70–99)
Potassium: 3.9 mmol/L (ref 3.5–5.1)
Sodium: 139 mmol/L (ref 135–145)
Total Bilirubin: 0.6 mg/dL (ref <1.2)
Total Protein: 5.9 g/dL — ABNORMAL LOW (ref 6.5–8.1)

## 2023-02-14 LAB — CBC
HCT: 42.6 % (ref 39.0–52.0)
Hemoglobin: 14.1 g/dL (ref 13.0–17.0)
MCH: 30.5 pg (ref 26.0–34.0)
MCHC: 33.1 g/dL (ref 30.0–36.0)
MCV: 92.2 fL (ref 80.0–100.0)
Platelets: 162 10*3/uL (ref 150–400)
RBC: 4.62 MIL/uL (ref 4.22–5.81)
RDW: 16.2 % — ABNORMAL HIGH (ref 11.5–15.5)
WBC: 5.4 10*3/uL (ref 4.0–10.5)
nRBC: 0 % (ref 0.0–0.2)

## 2023-02-14 LAB — GLUCOSE, CAPILLARY
Glucose-Capillary: 178 mg/dL — ABNORMAL HIGH (ref 70–99)
Glucose-Capillary: 196 mg/dL — ABNORMAL HIGH (ref 70–99)
Glucose-Capillary: 205 mg/dL — ABNORMAL HIGH (ref 70–99)
Glucose-Capillary: 263 mg/dL — ABNORMAL HIGH (ref 70–99)
Glucose-Capillary: 281 mg/dL — ABNORMAL HIGH (ref 70–99)
Glucose-Capillary: 291 mg/dL — ABNORMAL HIGH (ref 70–99)

## 2023-02-14 LAB — MAGNESIUM: Magnesium: 1.8 mg/dL (ref 1.7–2.4)

## 2023-02-14 MED ORDER — VITAMIN B-1 100 MG PO TABS: 100.0000 mg | ORAL_TABLET | Freq: Every day | ORAL | 0 refills | Status: AC

## 2023-02-14 MED ORDER — INSULIN ASPART 100 UNIT/ML IJ SOLN: 10.0000 [IU] | Freq: Three times a day (TID) | INTRAMUSCULAR | Status: AC

## 2023-02-14 MED ORDER — GUAIFENESIN ER 600 MG PO TB12: 600.0000 mg | ORAL_TABLET | Freq: Two times a day (BID) | ORAL | 0 refills | Status: AC

## 2023-02-14 MED ORDER — GUAIFENESIN ER 600 MG PO TB12
600.0000 mg | ORAL_TABLET | Freq: Two times a day (BID) | ORAL | Status: DC
  Administered 2023-02-14: 600 mg via ORAL
  Filled 2023-02-14: qty 1

## 2023-02-14 MED ORDER — INSULIN ASPART 100 UNIT/ML IJ SOLN: 0.0000 [IU] | Freq: Three times a day (TID) | INTRAMUSCULAR | Status: DC

## 2023-02-14 NOTE — Care Management Obs Status (Signed)
MEDICARE OBSERVATION STATUS NOTIFICATION   Patient Details  Name: Ardath Seele MRN: [id number] Date of Birth: Oct 09, 1960   Medicare Observation Status Notification Given:  Yes    Gordy Clement, RN 02/14/2023, 3:35 PM

## 2023-02-14 NOTE — TOC CM/SW Note (Signed)
Transition of Care [facility]) - Inpatient Brief Assessment   Patient Details  Name: Ricardo Howard MRN: [id number] Date of Birth: 1961-02-03  [facility] CM/SW Contact:    Mearl Latin, LCSW Phone Number: 02/14/2023, 10:56 AM   Clinical Narrative: CSW received consult for substance use and patient for discharge home today. Resources placed on AVS for patient as requested. No other needs identified at this time.    Transition of Care Asessment: Insurance and Status: Insurance coverage has been reviewed Patient has primary care physician: Yes Home environment has been reviewed: From home Prior level of function:: Independent living Prior/Current Home Services: No current home services Social Drivers of Health Review: SDOH reviewed no interventions necessary Readmission risk has been reviewed: Yes Transition of care needs: no transition of care needs at this time

## 2023-02-14 NOTE — Plan of Care (Signed)

## 2023-02-14 NOTE — Inpatient Diabetes Management (Signed)
Inpatient Diabetes Program Recommendations  AACE/ADA: New Consensus Statement on Inpatient Glycemic Control (2015)  Target Ranges:  Prepandial:   less than 140 mg/dL      Peak postprandial:   less than 180 mg/dL (1-2 hours)      Critically ill patients:  140 - 180 mg/dL   Lab Results  Component Value Date   GLUCAP 263 (H) 02/14/2023   HGBA1C 9.6 (H) 06/20/2022    Latest Reference Range & Units 02/14/23 08:00 02/14/23 10:15 02/14/23 12:06  Glucose-Capillary 70 - 99 mg/dL 644 (H) 034 (H) 742 (H)  (H): Data is abnormally high  Review of Glycemic Control  Diabetes history: DM2 Outpatient Diabetes medications: Lantus 65 units daily, Novolog 20 units tid meal coverage, Jardiance 12.5 mg daily Current orders for Inpatient glycemic control: Novolog 0-15 units tid  Inpatient Diabetes Program Recommendations:   Spoke with patient @ bedside and discussed insulin dosing and hypoglycemia. Patient stated he took random dose of Novolog 30 units for elevated blood glucose. Reviewed hypoglycemia protocol and patient was able to verbalize understanding. Discussed need to follow physician orders of insulin dosing. Discussed sliding scale with patient and will need to follow as prescribed on discharge.  Thank you, Ricardo Howard. Bonney Berres, RN, MSN, CDCES  Diabetes Coordinator Inpatient Glycemic Control Team Team Pager [phone] (8am-5pm) 02/14/2023 2:36 PM

## 2023-02-14 NOTE — Discharge Instructions (Signed)
In a time of Crisis: [facility]  Mobile Crisis Management provides immediate crisis response, 24/7.  Call [phone]  [facility] for MH/DD/SA [facility] is available 24 hours a day, 7 days a week. Customer Service Specialists will assist you to find a crisis provider that is well-matched with your needs. Your local number is: [phone]  [facility] ([facility]) IOP, individual counseling, medication management [address] [place], [place] [address] [phone] Call for intake hours; Medicaid and Uninsured    Outpatient Providers  [facility] ([facility]) Group and individual counseling. [address]  [place], [place] [address] [phone] [place]: [phone]  [place]: [phone] Medicaid and uninsured.   [facility] Offers IOP groups multiple times per week. [address] [place]rian [place], [place] [address] [phone] Takes Medicaid and other insurances.   [facility]  Chemical Dependency Intensive Outpatient Program (IOP) [address] [place], [place] [address] [phone] Takes Nurse, learning disability and [place].   [facility]  IOP and Partial Hospitalization Program  [address]  [place], [place] [address] [phone] Private Insurance, [place] only for partial hospitalization  [facility] [address], [place], [place] [address] [phone] Monday-Friday. Short and Long term options. [facility] ([facility]) IOP, individual counseling, medication management [address] [place], [place] [address] [phone] Medicaid and [facility]  [facility] [address]  [place], [place] [address] [phone] Private Insurance and Self Pay   [facility] [address]  [place], [place] [address] [phone] Private Insurance, [place], and Self Pay   [facility]  [address] [place], [place] [address] [facility]  [address]  [place], [place] [address] [phone]  [facility]  [address] [place], [place] [address] [phone]  [facility] [phone] [facility] and [facility]      Residential Treatment Programs  [facility] [address] [place], [place] [address] [phone] or [phone] Detox and Residential Rehab 14 days (Medicare, Medicaid, private insurance, and self pay). No methadone. Call for pre-screen.   [facility]) [address]  [place], [place] [address] [phone] Detox (self Pay and Medicaid Limited availability) Rehab Only Male (Medicare, Medicaid, and Self Pay)-No methadone.  [facility] [address] [place], [place] [address] [phone] or [phone] Private Insurance only  [facility] [address] [place], [place] [address] Phone:  [phone] Must be detoxed 72 hours prior to admission; 28 day program.  Self-pay.  [facility] [address]  [place], [place] [phone] [employer], Medicare, [place] (not straight [place]). They offer assistance with transportation.   [facility] [address] [place],  [place], [place] [address] [phone] Christian Based Program. Men only. No insurance  [facility] [address] [place], [place] [address] Women's: [phone] Men's: [phone] No Medicaid.   [facility] [place] Locations across the [place] (mainly [place]) willing to help with transportation.  [phone] [employer]. [facility]  [address]  [place], [place] [address] [phone] Treatment Only, must make  assessment appointment, and must be sober for assessment appointment. Self pay, Medicare A and B, [facility],  must be [facility] resident. No methadone.   [address] [place], [place] [address] Phone: [phone] Inpatient 24/7 and outpatient services. Individuals with Medicaid have no obligation for a copay. Individuals with Medicare or private insurance will be obligated to meet their policy's requirement(s). Individuals who are uninsured will be eligible for a sliding or discounted scaled  [facility]  [address] [place], [place] [address] [phone] No pending legal charges, Long-term work program. No methadone. Call for assessment.  [facility]  [address], [place], [place] [address] [phone] or [phone] Commercial Insurance Only  [facility] - [phone] Private Insurance (no [place]). Males/Females, call to make referrals, multiple facilities   [facility] [address]  [place], [place] [address]  [phone] Men Only Upfront Fee   [facility]no methadone [facility] [address] [place], [place] [address] [phone] (f)         AA Meetings Website to locate meetings (virtually or in person): [email or web address]/ Phone: [phone]  Syringe Services Program: Due to COVID-19, syringe services programs are likely operating under different hours with limited or no fixed site hours. Some programs may not be operating at all. Please contact the program directly using the phone numbers provided below to see if they are still operating under COVID-19.  [facility] Solution to the Opioid Problem ([facility]) Fixed; mobile; peer-based Roxy Cedar [phone] [email or web address] Fixed site exchange at [facility], [address] [place], [place] [address] on Wednesdays (2:00 - 5:00 pm) and Thursdays  (4:00 - 8:00 pm). Pop-up mobile exchange locations: [employer], [address] [place], [place] [address] on Tuesdays (11:00 am - 1:00 pm) and Fridays (11:00 am - 1:00 pm)  -[facility] - [address], [place], [place] [address] on Tuesdays (2:00 - 4:00 pm) and Fridays (2:00 - 4:00 pm)  -[facility] copy - also serves Radio broadcast assistant and [employer] [place] [employer]; Fixed; mobile; peer-based; Lendon Ka [phone] [email or web address] [address] [place], [place] [address] Delivery and outreach available in [place] and [place], please call for more information. Monday, Tuesday: 1:00 -7:00 pm, Thursday: 4:00 pm - 8:00 pm or Friday: 1:00 pm - 8:00 pm  Medication-Assisted Treatment (MAT):  -[facility]- services [address] and surrounding areas including [place], [place], [place], [place], [address], [place], [place], [place], [place], and [place], [name]. Options include Methadone, buprenorphine or Suboxone. [address], [place], [place] [address] Phone: [phone] Mon - Fri: 5:30am - 2:00pm Sat: 5:30am -7:30am Sun: Closed Holidays: 6:00am - 8:00am  -[facility]- We use FDA-approved medications, like methadone/suboxone/sublocade, and vivitrol. These medications are then combined with customized care plans that include individual or group counseling, toxicology, and medical care directed by on-site physicians. Accepts most insurance plans, Medicaid, and private pay.  [address] [place], [place] [address] Phone: [phone] Monday-Friday 5:00 AM - 10:00 AM Saturday 6:00 AM - 8:30 AM Sunday 6:00 AM - 7:00 AM  -[facility]- [facility] is a treatment & recovery focused program. In addition to receiving methadone medication, our clients participate in individual and group counseling as well as random drug testing. If accepted into the [facility] Opioid Program, you  will be provided several intake appointments and a physical exam [address] [place], [place] [address] Office: [phone]  Fax: [phone]  -[facility]- We put our community members at the  center of everything we do, for remote treatment services as well as in-person, from alcohol withdrawal to opioid use and more.  [address], [place], [place] [address] [phone] Monday-Wednesday: 9:00am - 5:00pm Thursday: 9:00am - 6:00pm Friday: 9:00am - 5:00pm Saturday: 9:00am - 1:00pm Sunday: Closed  -[facility] [place]) [address], [place], [place] [address] [phone]  [place] [phone] [address] [place], [place] [address]  M-W    5:00am-12:00pm Thu     5:00am-10:00am Fri       5:00am-12:00pm Sat      5:00am-8:00am Sun     Closed  $12/daily for Methadone Treatment.

## 2023-02-14 NOTE — Discharge Summary (Signed)
[facility]  Physician Discharge Summary   Patient ID: Ricardo Howard MRN: [id number] DOB/AGE: May 08, 1960 62 y.o.  Admit date: 02/13/2023 Discharge date:   02/14/2023   PCP: [facility]  DISCHARGE DIAGNOSES:    Hypoglycemia due to insulin   Diabetes mellitus with diabetic neuropathy, with long-term current use of insulin (HCC)   Alcohol abuse   Essential hypertension   Tobacco abuse   Chronic combined systolic and diastolic CHF (congestive heart failure) (HCC)   Near syncope   CAD S/P percutaneous coronary angioplasty   RECOMMENDATIONS FOR OUTPATIENT FOLLOW UP: Follow-up with PCP for further management of diabetes   Home Health: None Equipment/Devices: None  CODE STATUS: Full code  DISCHARGE CONDITION: fair  Diet recommendation: Modified carbohydrate  INITIAL HISTORY:  61 y.o. male with a past medical history of insulin-dependent diabetes, coronary artery disease, essential hypertension, alcohol abuse who was brought in by EMS after patient had a near syncopal episode at home.  Patient is very somnolent currently so history is limited.  Most of the information was obtained from the ED provider notes and from triage notes.  It appears that patient checked his glucose levels this morning and his reading was high.  He proceeded to take 30 units of short acting as well as 30 units of his long-acting insulin.  His blood sugar came down from 300s to 100's.  He felt dizzy and almost passed out.  Initially his blood pressure was in the 70s systolic.  He was given fluid bolus by EMS and his blood pressure responded.  Patient is able to tell me that he did not have any chest pain or shortness of breath during this episode.  Denies any chest pain currently.     In the emergency department he was found to have a glucose level initially of 55 and then subsequently 30.  He was given D50 and then had to be started on a D10 infusion.  Blood work only showed mild  hypokalemia.  Due to his persistent hypoglycemia he is thought to require hospitalization.     HOSPITAL COURSE:   #1. Near syncope likely due to hypoglycemia from possibly excessive insulin use: He was placed on D10 infusion.  His glucose level stabilized.  D10 discontinued this morning.  Will see how his glucose trends are over the next few hours.  If he tolerates his diet and his glucose levels do not decrease he should be able to go home this afternoon.  HbA1c level is pending.  UA and urine drug screen were unremarkable.   HbA1c was 9.6 in May.  COVID-19 influenza and RSV PCR's were negative.  Does not appear to be on any narcotics at home.   #2. Hypotension: Blood pressures have improved with fluid bolus.  May resume his home medications.   #3. Diabetes mellitus type 2, insulin-dependent: See above.   #4. Coronary artery disease: Stable.  No ischemic changes on EKG. resume home medications   #5. Chronic combined systolic and diastolic CHF: Echo from May 2023 shows that he had a LVEF of 45 to 50%.  Resume home medications   #6. Hypokalemia: Supplemented   #7. History of alcohol abuse: Mentions that he drinks about 3 beers on a daily basis.  No evidence of withdrawal so far.   #8. Obesity Estimated body mass index is 33.79 kg/m as calculated from the following:   Height as of this encounter: 5\' 8"  (1.727 m).   Weight as of this encounter: 100.8 kg.  Patient is stable.  Seems to have improved.  Mentation is back to baseline.  Okay for discharge later today if glucose levels remain stable and if he is able to tolerate his diet.  PERTINENT LABS:  The results of significant diagnostics from this hospitalization (including imaging, microbiology, ancillary and laboratory) are listed below for reference.    Microbiology: Recent Results (from the past 240 hours)  Resp panel by RT-PCR (RSV, Flu A&B, Covid) Anterior Nasal Swab     Status: None   Collection Time: 02/13/23  2:54 PM    Specimen: Anterior Nasal Swab  Result Value Ref Range Status   SARS Coronavirus 2 by RT PCR NEGATIVE NEGATIVE Final   Influenza A by PCR NEGATIVE NEGATIVE Final   Influenza B by PCR NEGATIVE NEGATIVE Final    Comment: (NOTE) The Xpert Xpress SARS-CoV-2/FLU/RSV plus assay is intended as an aid in the diagnosis of influenza from Nasopharyngeal swab specimens and should not be used as a sole basis for treatment. Nasal washings and aspirates are unacceptable for Xpert Xpress SARS-CoV-2/FLU/RSV testing.  Fact Sheet for Patients: BloggerCourse.com  Fact Sheet for Healthcare Providers: SeriousBroker.it  This test is not yet approved or cleared by the [place] FDA and has been authorized for detection and/or diagnosis of SARS-CoV-2 by FDA under an Emergency Use Authorization (EUA). This EUA will remain in effect (meaning this test can be used) for the duration of the COVID-19 declaration under Section 564(b)(1) of the Act, 21 U.S.C. section 360bbb-3(b)(1), unless the authorization is terminated or revoked.     Resp Syncytial Virus by PCR NEGATIVE NEGATIVE Final    Comment: (NOTE) Fact Sheet for Patients: BloggerCourse.com  Fact Sheet for Healthcare Providers: SeriousBroker.it  This test is not yet approved or cleared by the [place] FDA and has been authorized for detection and/or diagnosis of SARS-CoV-2 by FDA under an Emergency Use Authorization (EUA). This EUA will remain in effect (meaning this test can be used) for the duration of the COVID-19 declaration under Section 564(b)(1) of the Act, 21 U.S.C. section 360bbb-3(b)(1), unless the authorization is terminated or revoked.  Performed at [facility], [address], [place], [place] [address]      Labs:   Basic Metabolic Panel: Recent Labs  Lab 02/13/23 1702 02/14/23 0421  NA 142 139  K 3.2* 3.9  CL  111 109  CO2 21* 21*  GLUCOSE 36* 172*  BUN 12 9  CREATININE 0.66 0.63  CALCIUM 8.5* 8.4*  MG  --  1.8   Liver Function Tests: Recent Labs  Lab 02/14/23 0421  AST 30  ALT 33  ALKPHOS 58  BILITOT 0.6  PROT 5.9*  ALBUMIN 2.6*    CBC: Recent Labs  Lab 02/13/23 1512 02/14/23 0421  WBC 6.1 5.4  HGB 14.4 14.1  HCT 43.8 42.6  MCV 93.8 92.2  PLT 186 162    CBG: Recent Labs  Lab 02/14/23 0037 02/14/23 0224 02/14/23 0430 02/14/23 0800 02/14/23 1015  GLUCAP 281* 196* 205* 178* 291*     IMAGING STUDIES DG Chest 2 View Result Date: 02/13/2023 CLINICAL DATA:  Cough.  Near syncopal episode. EXAM: CHEST - 2 VIEW COMPARISON:  06/19/2022 FINDINGS: Heart size remains at the upper limits of normal. Both lungs are clear. The visualized skeletal structures are unremarkable. IMPRESSION: No active cardiopulmonary disease. Electronically Signed   By: Danae Orleans M.D.   On: 02/13/2023 16:32    DISCHARGE EXAMINATION: Vitals:   02/14/23 0429 02/14/23 0800 02/14/23  0900 02/14/23 1019  BP: 135/82 (!) 146/75 (!) 147/108 139/89  Pulse: 79 82    Resp: 18 18    Temp: 98.2 F (36.8 C) 98.3 F (36.8 C)    TempSrc: Oral Oral    SpO2: 96% 96%    Weight:      Height:       General appearance: Awake alert.  In no distress Resp: Clear to auscultation bilaterally.  Normal effort Cardio: S1-S2 is normal regular.  No S3-S4.  No rubs murmurs or bruit GI: Abdomen is soft.  Nontender nondistended.  Bowel sounds are present normal.  No masses organomegaly Extremities: No edema.  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  No focal neurological deficits.    DISPOSITION: Home  Discharge Instructions     Call MD for:  difficulty breathing, headache or visual disturbances   Complete by: As directed    Call MD for:  extreme fatigue   Complete by: As directed    Call MD for:  persistant dizziness or light-headedness   Complete by: As directed     Call MD for:  persistant nausea and vomiting   Complete by: As directed    Call MD for:  severe uncontrolled pain   Complete by: As directed    Call MD for:  temperature >100.4   Complete by: As directed    Diet - low sodium heart healthy   Complete by: As directed    Discharge instructions   Complete by: As directed    Please be sure to follow-up with your primary care provider within 1 week after discharge.  Please pay careful attention to how much insulin you are using to avoid low glucose levels.  You were cared for by a hospitalist during your hospital stay. If you have any questions about your discharge medications or the care you received while you were in the hospital after you are discharged, you can call the unit and asked to speak with the hospitalist on call if the hospitalist that took care of you is not available. Once you are discharged, your primary care physician will handle any further medical issues. Please note that NO REFILLS for any discharge medications will be authorized once you are discharged, as it is imperative that you return to your primary care physician (or establish a relationship with a primary care physician if you do not have one) for your aftercare needs so that they can reassess your need for medications and monitor your lab values. If you do not have a primary care physician, you can call [phone] for a physician referral.   Increase activity slowly   Complete by: As directed          Allergies as of 02/14/2023       Reactions   Iohexol Hives   Patient broke out in hives after injection of Omni 300, will need 13 hour pre-med in future   Ozempic (0.25 Or 0.5 Mg-dose) [semaglutide(0.25 Or 0.5mg -dos)] Other (See Comments)   pancreatitis   Shellfish Allergy Hives   Milk (cow) Diarrhea   Finding of gastrointestinal tract gas   Zestril [lisinopril] Cough   Zocor [simvastatin] Itching, Cough        Medication List     TAKE these medications     acetaminophen 325 MG tablet Commonly known as: TYLENOL Take 2 tablets (650 mg total) by mouth every 6 (six) hours as needed for mild pain (or Fever >/=  101).   aspirin EC 81 MG tablet Take 81 mg by mouth in the morning.   atorvastatin 80 MG tablet Commonly known as: LIPITOR Take 1 tablet (80 mg total) by mouth at bedtime.   buPROPion 150 MG 12 hr tablet Commonly known as: WELLBUTRIN SR Take 150 mg by mouth 2 (two) times daily.   clopidogrel 75 MG tablet Commonly known as: PLAVIX Take 75 mg by mouth in the morning.   empagliflozin 25 MG Tabs tablet Commonly known as: JARDIANCE Take 1 tablet (25 mg total) by mouth daily. What changed: how much to take   Entresto 24-26 MG Generic drug: sacubitril-valsartan Take 1 tablet by mouth 2 (two) times daily.   finasteride 5 MG tablet Commonly known as: PROSCAR Take 5 mg by mouth in the morning.   folic acid 1 MG tablet Commonly known as: FOLVITE Take 1 tablet (1 mg total) by mouth daily. What changed: when to take this   furosemide 40 MG tablet Commonly known as: LASIX Take 1 tablet (40 mg total) by mouth daily. What changed:  how much to take how to take this when to take this   gabapentin 300 MG capsule Commonly known as: NEURONTIN Take 300 mg by mouth 3 (three) times daily. Take 1 capsule (300 mg) by mouth in the morning, take 1 capsule (300 mg) by mouth at lunch, & take 2 capsules (600 mg) by mouth at bedtime   guaiFENesin 600 MG 12 hr tablet Commonly known as: MUCINEX Take 1 tablet (600 mg total) by mouth 2 (two) times daily.   insulin aspart 100 UNIT/ML injection Commonly known as: novoLOG Inject 10 Units into the skin 3 (three) times daily with meals. What changed:  how much to take additional instructions   insulin glargine 100 UNIT/ML Solostar Pen Commonly known as: LANTUS 30units in am and 35unit at night  Further adjustment per your pcp and endocrinology Goal of a1c is less than 7%, currently your  a1c is 10%, not at goal What changed:  how much to take how to take this when to take this additional instructions   Insulin Pen Needle 30G X 8 MM Misc Commonly known as: NOVOFINE Inject 10 each into the skin as needed.   metoprolol tartrate 100 MG tablet Commonly known as: LOPRESSOR Take 50 mg by mouth in the morning.   naproxen sodium 220 MG tablet Commonly known as: ALEVE Take 220 mg by mouth daily as needed (headache).   nitroGLYCERIN 0.4 MG SL tablet Commonly known as: NITROSTAT Place 1 tablet (0.4 mg total) under the tongue every 5 (five) minutes as needed for chest pain.   potassium chloride SA 20 MEQ tablet Commonly known as: KLOR-CON M Take 1 tablet (20 mEq total) by mouth daily. What changed: when to take this   Quintabs Tabs Take 1 tablet by mouth daily.   ranolazine 500 MG 12 hr tablet Commonly known as: RANEXA Take 500 mg by mouth 2 (two) times daily.   sertraline 100 MG tablet Commonly known as: ZOLOFT Take 150 mg by mouth daily.   thiamine 100 MG tablet Commonly known as: Vitamin B-1 Take 1 tablet (100 mg total) by mouth daily. Start taking on: February 15, 2023          Follow-up Information     [facility], [facility]. Schedule an appointment as soon as possible for a visit in 1 week(s).   Why: post hospitalization follow up Contact information: [address] [facility] [place] [address] [phone]  TOTAL DISCHARGE TIME: 35 minutes  Reese Stockman [employer] on [email or web address]  02/14/2023, 10:26 AM

## 2023-02-15 LAB — HEMOGLOBIN A1C
Hgb A1c MFr Bld: 10.9 % — ABNORMAL HIGH (ref 4.8–5.6)
Mean Plasma Glucose: 266 mg/dL

## 2023-03-17 ENCOUNTER — Other Ambulatory Visit: Payer: Self-pay

## 2023-03-17 ENCOUNTER — Inpatient Hospital Stay (HOSPITAL_COMMUNITY)
Admission: EM | Admit: 2023-03-17 | Discharge: 2023-03-21 | DRG: 871 | Disposition: A | Discharge status: Home Health | Payer: No Typology Code available for payment source | Attending: Internal Medicine | Admitting: Internal Medicine

## 2023-03-17 ENCOUNTER — Encounter (HOSPITAL_COMMUNITY): Payer: Self-pay | Admitting: Emergency Medicine

## 2023-03-17 ENCOUNTER — Emergency Department (HOSPITAL_COMMUNITY): Payer: No Typology Code available for payment source

## 2023-03-17 DIAGNOSIS — J441 Chronic obstructive pulmonary disease with (acute) exacerbation: Secondary | ICD-10-CM | POA: Diagnosis present

## 2023-03-17 DIAGNOSIS — E1151 Type 2 diabetes mellitus with diabetic peripheral angiopathy without gangrene: Secondary | ICD-10-CM | POA: Diagnosis present

## 2023-03-17 DIAGNOSIS — Z888 Allergy status to other drugs, medicaments and biological substances status: Secondary | ICD-10-CM

## 2023-03-17 DIAGNOSIS — E1142 Type 2 diabetes mellitus with diabetic polyneuropathy: Secondary | ICD-10-CM | POA: Diagnosis present

## 2023-03-17 DIAGNOSIS — F319 Bipolar disorder, unspecified: Secondary | ICD-10-CM | POA: Diagnosis present

## 2023-03-17 DIAGNOSIS — R509 Fever, unspecified: Secondary | ICD-10-CM

## 2023-03-17 DIAGNOSIS — Z91013 Allergy to seafood: Secondary | ICD-10-CM

## 2023-03-17 DIAGNOSIS — E1140 Type 2 diabetes mellitus with diabetic neuropathy, unspecified: Secondary | ICD-10-CM | POA: Diagnosis not present

## 2023-03-17 DIAGNOSIS — A419 Sepsis, unspecified organism: Secondary | ICD-10-CM | POA: Diagnosis not present

## 2023-03-17 DIAGNOSIS — D696 Thrombocytopenia, unspecified: Secondary | ICD-10-CM | POA: Diagnosis present

## 2023-03-17 DIAGNOSIS — Z91011 Allergy to milk products: Secondary | ICD-10-CM

## 2023-03-17 DIAGNOSIS — N138 Other obstructive and reflux uropathy: Secondary | ICD-10-CM | POA: Diagnosis present

## 2023-03-17 DIAGNOSIS — F17210 Nicotine dependence, cigarettes, uncomplicated: Secondary | ICD-10-CM | POA: Diagnosis present

## 2023-03-17 DIAGNOSIS — Z7984 Long term (current) use of oral hypoglycemic drugs: Secondary | ICD-10-CM

## 2023-03-17 DIAGNOSIS — R400 Somnolence: Secondary | ICD-10-CM | POA: Diagnosis present

## 2023-03-17 DIAGNOSIS — Z8619 Personal history of other infectious and parasitic diseases: Secondary | ICD-10-CM

## 2023-03-17 DIAGNOSIS — Z955 Presence of coronary angioplasty implant and graft: Secondary | ICD-10-CM

## 2023-03-17 DIAGNOSIS — R338 Other retention of urine: Secondary | ICD-10-CM | POA: Diagnosis present

## 2023-03-17 DIAGNOSIS — Z89511 Acquired absence of right leg below knee: Secondary | ICD-10-CM | POA: Diagnosis not present

## 2023-03-17 DIAGNOSIS — J9621 Acute and chronic respiratory failure with hypoxia: Secondary | ICD-10-CM | POA: Diagnosis present

## 2023-03-17 DIAGNOSIS — E1169 Type 2 diabetes mellitus with other specified complication: Secondary | ICD-10-CM | POA: Diagnosis present

## 2023-03-17 DIAGNOSIS — E1165 Type 2 diabetes mellitus with hyperglycemia: Secondary | ICD-10-CM | POA: Diagnosis not present

## 2023-03-17 DIAGNOSIS — I959 Hypotension, unspecified: Secondary | ICD-10-CM

## 2023-03-17 DIAGNOSIS — Y92239 Unspecified place in hospital as the place of occurrence of the external cause: Secondary | ICD-10-CM | POA: Diagnosis not present

## 2023-03-17 DIAGNOSIS — F1010 Alcohol abuse, uncomplicated: Secondary | ICD-10-CM | POA: Diagnosis present

## 2023-03-17 DIAGNOSIS — R6520 Severe sepsis without septic shock: Secondary | ICD-10-CM | POA: Diagnosis present

## 2023-03-17 DIAGNOSIS — Z1152 Encounter for screening for COVID-19: Secondary | ICD-10-CM | POA: Diagnosis not present

## 2023-03-17 DIAGNOSIS — I110 Hypertensive heart disease with heart failure: Secondary | ICD-10-CM | POA: Diagnosis present

## 2023-03-17 DIAGNOSIS — Z794 Long term (current) use of insulin: Secondary | ICD-10-CM

## 2023-03-17 DIAGNOSIS — M25571 Pain in right ankle and joints of right foot: Secondary | ICD-10-CM | POA: Diagnosis not present

## 2023-03-17 DIAGNOSIS — Z79899 Other long term (current) drug therapy: Secondary | ICD-10-CM

## 2023-03-17 DIAGNOSIS — Z91041 Radiographic dye allergy status: Secondary | ICD-10-CM

## 2023-03-17 DIAGNOSIS — F419 Anxiety disorder, unspecified: Secondary | ICD-10-CM | POA: Diagnosis present

## 2023-03-17 DIAGNOSIS — I2510 Atherosclerotic heart disease of native coronary artery without angina pectoris: Secondary | ICD-10-CM | POA: Diagnosis not present

## 2023-03-17 DIAGNOSIS — Z95820 Peripheral vascular angioplasty status with implants and grafts: Secondary | ICD-10-CM

## 2023-03-17 DIAGNOSIS — Z6833 Body mass index (BMI) 33.0-33.9, adult: Secondary | ICD-10-CM

## 2023-03-17 DIAGNOSIS — E11319 Type 2 diabetes mellitus with unspecified diabetic retinopathy without macular edema: Secondary | ICD-10-CM | POA: Diagnosis present

## 2023-03-17 DIAGNOSIS — N401 Enlarged prostate with lower urinary tract symptoms: Secondary | ICD-10-CM | POA: Diagnosis not present

## 2023-03-17 DIAGNOSIS — E66811 Obesity, class 1: Secondary | ICD-10-CM | POA: Diagnosis present

## 2023-03-17 DIAGNOSIS — R739 Hyperglycemia, unspecified: Secondary | ICD-10-CM | POA: Diagnosis not present

## 2023-03-17 DIAGNOSIS — I5022 Chronic systolic (congestive) heart failure: Secondary | ICD-10-CM | POA: Diagnosis present

## 2023-03-17 DIAGNOSIS — Z720 Tobacco use: Secondary | ICD-10-CM | POA: Diagnosis present

## 2023-03-17 DIAGNOSIS — Z9861 Coronary angioplasty status: Secondary | ICD-10-CM | POA: Diagnosis not present

## 2023-03-17 DIAGNOSIS — Z7982 Long term (current) use of aspirin: Secondary | ICD-10-CM

## 2023-03-17 DIAGNOSIS — E782 Mixed hyperlipidemia: Secondary | ICD-10-CM | POA: Diagnosis present

## 2023-03-17 DIAGNOSIS — J101 Influenza due to other identified influenza virus with other respiratory manifestations: Principal | ICD-10-CM | POA: Diagnosis present

## 2023-03-17 DIAGNOSIS — E876 Hypokalemia: Secondary | ICD-10-CM | POA: Diagnosis present

## 2023-03-17 DIAGNOSIS — Z9981 Dependence on supplemental oxygen: Secondary | ICD-10-CM

## 2023-03-17 DIAGNOSIS — A4189 Other specified sepsis: Secondary | ICD-10-CM | POA: Diagnosis present

## 2023-03-17 DIAGNOSIS — R0689 Other abnormalities of breathing: Secondary | ICD-10-CM | POA: Diagnosis not present

## 2023-03-17 DIAGNOSIS — R7401 Elevation of levels of liver transaminase levels: Secondary | ICD-10-CM | POA: Diagnosis present

## 2023-03-17 DIAGNOSIS — Z7902 Long term (current) use of antithrombotics/antiplatelets: Secondary | ICD-10-CM

## 2023-03-17 DIAGNOSIS — Z89519 Acquired absence of unspecified leg below knee: Secondary | ICD-10-CM

## 2023-03-17 DIAGNOSIS — T380X5A Adverse effect of glucocorticoids and synthetic analogues, initial encounter: Secondary | ICD-10-CM | POA: Diagnosis not present

## 2023-03-17 DIAGNOSIS — R7989 Other specified abnormal findings of blood chemistry: Secondary | ICD-10-CM | POA: Diagnosis present

## 2023-03-17 LAB — CBC
HCT: 43.8 % (ref 39.0–52.0)
Hemoglobin: 14.4 g/dL (ref 13.0–17.0)
MCH: 30.8 pg (ref 26.0–34.0)
MCHC: 32.9 g/dL (ref 30.0–36.0)
MCV: 93.8 fL (ref 80.0–100.0)
Platelets: 109 10*3/uL — ABNORMAL LOW (ref 150–400)
RBC: 4.67 MIL/uL (ref 4.22–5.81)
RDW: 16 % — ABNORMAL HIGH (ref 11.5–15.5)
WBC: 5.1 10*3/uL (ref 4.0–10.5)
nRBC: 0 % (ref 0.0–0.2)

## 2023-03-17 LAB — URINALYSIS, W/ REFLEX TO CULTURE (INFECTION SUSPECTED)
Bacteria, UA: NONE SEEN
Bilirubin Urine: NEGATIVE
Glucose, UA: >=500 mg/dL — AB
Hgb urine dipstick: NEGATIVE
Ketones, ur: NEGATIVE mg/dL
Leukocytes,Ua: NEGATIVE
Nitrite: NEGATIVE
Protein, ur: NEGATIVE mg/dL
Specific Gravity, Urine: 1.031 — ABNORMAL HIGH (ref 1.005–1.030)
pH: 5 (ref 5.0–8.0)

## 2023-03-17 LAB — CBC WITH DIFFERENTIAL/PLATELET
Abs Immature Granulocytes: 0.04 10*3/uL (ref 0.00–0.07)
Basophils Absolute: 0 10*3/uL (ref 0.0–0.1)
Basophils Relative: 1 %
Eosinophils Absolute: 0.1 10*3/uL (ref 0.0–0.5)
Eosinophils Relative: 1 %
HCT: 47.6 % (ref 39.0–52.0)
Hemoglobin: 16 g/dL (ref 13.0–17.0)
Immature Granulocytes: 1 %
Lymphocytes Relative: 5 %
Lymphs Abs: 0.3 10*3/uL — ABNORMAL LOW (ref 0.7–4.0)
MCH: 31.1 pg (ref 26.0–34.0)
MCHC: 33.6 g/dL (ref 30.0–36.0)
MCV: 92.6 fL (ref 80.0–100.0)
Monocytes Absolute: 0.5 10*3/uL (ref 0.1–1.0)
Monocytes Relative: 9 %
Neutro Abs: 4.6 10*3/uL (ref 1.7–7.7)
Neutrophils Relative %: 83 %
Platelets: 118 10*3/uL — ABNORMAL LOW (ref 150–400)
RBC: 5.14 MIL/uL (ref 4.22–5.81)
RDW: 15.9 % — ABNORMAL HIGH (ref 11.5–15.5)
WBC: 5.4 10*3/uL (ref 4.0–10.5)
nRBC: 0 % (ref 0.0–0.2)

## 2023-03-17 LAB — APTT: aPTT: 28 s (ref 24–36)

## 2023-03-17 LAB — PROCALCITONIN: Procalcitonin: <0.1 ng/mL

## 2023-03-17 LAB — COMPREHENSIVE METABOLIC PANEL
ALT: 46 U/L — ABNORMAL HIGH (ref 0–44)
AST: 53 U/L — ABNORMAL HIGH (ref 15–41)
Albumin: 3.6 g/dL (ref 3.5–5.0)
Alkaline Phosphatase: 84 U/L (ref 38–126)
Anion gap: 11 (ref 5–15)
BUN: 10 mg/dL (ref 8–23)
CO2: 21 mmol/L — ABNORMAL LOW (ref 22–32)
Calcium: 9 mg/dL (ref 8.9–10.3)
Chloride: 103 mmol/L (ref 98–111)
Creatinine, Ser: 1.14 mg/dL (ref 0.61–1.24)
GFR, Estimated: >60 mL/min (ref >60)
Glucose, Bld: 193 mg/dL — ABNORMAL HIGH (ref 70–99)
Potassium: 4.3 mmol/L (ref 3.5–5.1)
Sodium: 135 mmol/L (ref 135–145)
Total Bilirubin: 0.8 mg/dL (ref 0.0–1.2)
Total Protein: 7.4 g/dL (ref 6.5–8.1)

## 2023-03-17 LAB — RESP PANEL BY RT-PCR (RSV, FLU A&B, COVID)  RVPGX2
Influenza A by PCR: POSITIVE — AB
Influenza B by PCR: NEGATIVE
Resp Syncytial Virus by PCR: NEGATIVE
SARS Coronavirus 2 by RT PCR: NEGATIVE

## 2023-03-17 LAB — PROTIME-INR
INR: 1.1 (ref 0.8–1.2)
Prothrombin Time: 13.9 s (ref 11.4–15.2)

## 2023-03-17 LAB — I-STAT CG4 LACTIC ACID, ED: Lactic Acid, Venous: 1.4 mmol/L (ref 0.5–1.9)

## 2023-03-17 LAB — CBG MONITORING, ED: Glucose-Capillary: 204 mg/dL — ABNORMAL HIGH (ref 70–99)

## 2023-03-17 LAB — CK: Total CK: 53 U/L (ref 49–397)

## 2023-03-17 LAB — LACTIC ACID, PLASMA
Lactic Acid, Venous: 1.3 mmol/L (ref 0.5–1.9)
Lactic Acid, Venous: 1.2 mmol/L (ref 0.5–1.9)

## 2023-03-17 LAB — CREATININE, SERUM
Creatinine, Ser: 0.94 mg/dL (ref 0.61–1.24)
GFR, Estimated: >60 mL/min (ref >60)

## 2023-03-17 LAB — C-REACTIVE PROTEIN: CRP: 1.7 mg/dL — ABNORMAL HIGH (ref <1.0)

## 2023-03-17 MED ORDER — ACETAMINOPHEN 325 MG PO TABS
650.0000 mg | ORAL_TABLET | Freq: Once | ORAL | Status: AC
  Administered 2023-03-17: 650 mg via ORAL
  Filled 2023-03-17: qty 2

## 2023-03-17 MED ORDER — OSELTAMIVIR PHOSPHATE 30 MG PO CAPS
30.0000 mg | ORAL_CAPSULE | Freq: Two times a day (BID) | ORAL | Status: DC
  Administered 2023-03-17 – 2023-03-19 (×6): 30 mg via ORAL
  Filled 2023-03-17 (×6): qty 1

## 2023-03-17 MED ORDER — ACETAMINOPHEN 650 MG RE SUPP: 650.0000 mg | Freq: Four times a day (QID) | RECTAL | Status: DC | PRN

## 2023-03-17 MED ORDER — ACETAMINOPHEN 325 MG PO TABS
650.0000 mg | ORAL_TABLET | Freq: Four times a day (QID) | ORAL | Status: DC | PRN
  Administered 2023-03-20: 650 mg via ORAL
  Filled 2023-03-17: qty 2

## 2023-03-17 MED ORDER — METHYLPREDNISOLONE SODIUM SUCC 40 MG IJ SOLR
40.0000 mg | Freq: Every day | INTRAMUSCULAR | Status: DC
  Administered 2023-03-17 – 2023-03-20 (×4): 40 mg via INTRAVENOUS
  Filled 2023-03-17 (×4): qty 1

## 2023-03-17 MED ORDER — GABAPENTIN 300 MG PO CAPS
300.0000 mg | ORAL_CAPSULE | Freq: Three times a day (TID) | ORAL | Status: DC
  Administered 2023-03-17 – 2023-03-21 (×12): 300 mg via ORAL
  Filled 2023-03-17 (×12): qty 1

## 2023-03-17 MED ORDER — ENOXAPARIN SODIUM 40 MG/0.4ML IJ SOSY
40.0000 mg | PREFILLED_SYRINGE | INTRAMUSCULAR | Status: DC
Start: 2023-03-17 — End: 2023-03-21
  Administered 2023-03-17 – 2023-03-20 (×4): 40 mg via SUBCUTANEOUS
  Filled 2023-03-17 (×4): qty 0.4

## 2023-03-17 MED ORDER — SODIUM CHLORIDE 0.9 % IV SOLN: INTRAVENOUS | Status: DC

## 2023-03-17 MED ORDER — ONDANSETRON HCL 4 MG/2ML IJ SOLN: 4.0000 mg | Freq: Four times a day (QID) | INTRAMUSCULAR | Status: DC | PRN

## 2023-03-17 MED ORDER — SODIUM CHLORIDE 0.9% FLUSH
3.0000 mL | Freq: Two times a day (BID) | INTRAVENOUS | Status: DC
  Administered 2023-03-17 – 2023-03-21 (×8): 3 mL via INTRAVENOUS

## 2023-03-17 MED ORDER — LACTATED RINGERS IV BOLUS
1000.0000 mL | Freq: Once | INTRAVENOUS | Status: AC
  Administered 2023-03-17: 1000 mL via INTRAVENOUS

## 2023-03-17 MED ORDER — GUAIFENESIN 100 MG/5ML PO LIQD
5.0000 mL | ORAL | Status: DC | PRN
  Administered 2023-03-18 – 2023-03-19 (×3): 5 mL via ORAL
  Filled 2023-03-17 (×2): qty 5
  Filled 2023-03-17: qty 10

## 2023-03-17 MED ORDER — KETOROLAC TROMETHAMINE 15 MG/ML IJ SOLN
15.0000 mg | Freq: Four times a day (QID) | INTRAMUSCULAR | Status: DC | PRN
  Administered 2023-03-17: 15 mg via INTRAVENOUS
  Filled 2023-03-17: qty 1

## 2023-03-17 MED ORDER — ONDANSETRON HCL 4 MG PO TABS: 4.0000 mg | ORAL_TABLET | Freq: Four times a day (QID) | ORAL | Status: DC | PRN

## 2023-03-17 NOTE — ED Provider Notes (Signed)
[place] EMERGENCY DEPARTMENT AT [facility] Provider Note   CSN: [id number] Arrival date & time: 03/17/23  0341     History  Chief Complaint  Patient presents with   Altered Mental Status    Ricardo Howard is a 63 y.o. male.  The history is provided by the patient and the EMS personnel.  Altered Mental Status He has history of hypertension, diabetes, hyperlipidemia, bipolar disorder, COPD, coronary artery disease, peripheral vascular disease status post right below the knee amputation, combined systolic and diastolic heart failure and comes in because of pain in both of his feet, including the foot that has been amputated.  That started this afternoon.  EMS did note fever.  Patient was not aware of fever and denies chills or sweats.  There has been slight cough which is nonproductive.  He denies sore throat.  He denies any urinary urgency or frequency or tenesmus or dysuria to me, but EMS had noted complaint of urinary frequency.   Home Medications Prior to Admission medications   Medication Sig Start Date End Date Taking? Authorizing Provider  acetaminophen (TYLENOL) 325 MG tablet Take 2 tablets (650 mg total) by mouth every 6 (six) hours as needed for mild pain (or Fever >/= 101). 06/20/22   Elgergawy, Leana Roe, MD  aspirin EC 81 MG tablet Take 81 mg by mouth in the morning.    [provider]  atorvastatin (LIPITOR) 80 MG tablet Take 1 tablet (80 mg total) by mouth at bedtime. 08/30/21   Wendall Stade, MD  buPROPion (WELLBUTRIN SR) 150 MG 12 hr tablet Take 150 mg by mouth 2 (two) times daily.    [provider]  clopidogrel (PLAVIX) 75 MG tablet Take 75 mg by mouth in the morning.    [provider]  empagliflozin (JARDIANCE) 25 MG TABS tablet Take 1 tablet (25 mg total) by mouth daily. Patient taking differently: Take 12.5 mg by mouth daily. 08/30/21   Wendall Stade, MD  finasteride (PROSCAR) 5 MG tablet Take 5 mg by mouth in the morning.     [provider]  folic acid (FOLVITE) 1 MG tablet Take 1 tablet (1 mg total) by mouth daily. Patient taking differently: Take 1 mg by mouth in the morning. 10/11/19   Rodolph Bong, MD  furosemide (LASIX) 40 MG tablet Take 1 tablet (40 mg total) by mouth daily. Patient taking differently: Take 40 mg by mouth in the morning. Take 1 tablet (40 mg total) by mouth daily. 06/23/22   Elgergawy, Leana Roe, MD  gabapentin (NEURONTIN) 300 MG capsule Take 300 mg by mouth 3 (three) times daily. Take 1 capsule (300 mg) by mouth in the morning, take 1 capsule (300 mg) by mouth at lunch, & take 2 capsules (600 mg) by mouth at bedtime    [provider]  guaiFENesin (MUCINEX) 600 MG 12 hr tablet Take 1 tablet (600 mg total) by mouth 2 (two) times daily. 02/14/23   Osvaldo Shipper, MD  insulin aspart (NOVOLOG) 100 UNIT/ML injection Inject 10 Units into the skin 3 (three) times daily with meals. 02/14/23   Osvaldo Shipper, MD  insulin glargine (LANTUS) 100 UNIT/ML Solostar Pen 30units in am and 35unit at night  Further adjustment per your pcp and endocrinology Goal of a1c is less than 7%, currently your a1c is 10%, not at goal Patient taking differently: Inject 65 Units into the skin daily. Further adjustment per your pcp and endocrinology Goal of a1c is less  than 7%, currently your a1c is 10%, not at goal 06/03/21   Albertine Grates, MD  Insulin Pen Needle (NOVOFINE) 30G X 8 MM MISC Inject 10 each into the skin as needed. 10/10/19   Rodolph Bong, MD  metoprolol tartrate (LOPRESSOR) 100 MG tablet Take 50 mg by mouth in the morning.    [provider]  Multiple Vitamin (QUINTABS) TABS Take 1 tablet by mouth daily.    [provider]  naproxen sodium (ALEVE) 220 MG tablet Take 220 mg by mouth daily as needed (headache).    [provider]  nitroGLYCERIN (NITROSTAT) 0.4 MG SL tablet Place 1 tablet (0.4 mg total) under the tongue every 5 (five) minutes as needed for chest  pain. Patient not taking: Reported on 02/13/2023 07/19/21   Evlyn Kanner, MD  potassium chloride SA (KLOR-CON M) 20 MEQ tablet Take 1 tablet (20 mEq total) by mouth daily. Patient taking differently: Take 20 mEq by mouth daily after lunch. 08/31/21   Manson Passey, PA  ranolazine (RANEXA) 500 MG 12 hr tablet Take 500 mg by mouth 2 (two) times daily. 09/27/21   [provider]  sacubitril-valsartan (ENTRESTO) 24-26 MG Take 1 tablet by mouth 2 (two) times daily. 06/25/22   Elgergawy, Leana Roe, MD  sertraline (ZOLOFT) 100 MG tablet Take 150 mg by mouth daily.    [provider]  thiamine (VITAMIN B-1) 100 MG tablet Take 1 tablet (100 mg total) by mouth daily. 02/15/23   Osvaldo Shipper, MD      Allergies    Iohexol, Ozempic (0.25 or 0.5 mg-dose) [semaglutide(0.25 or 0.5mg -dos)], Shellfish allergy, Milk (cow), Zestril [lisinopril], and Zocor [simvastatin]    Review of Systems   Review of Systems  All other systems reviewed and are negative.   Physical Exam Updated Vital Signs BP 107/84   Pulse (!) 108   Temp (!) 102.7 F (39.3 C)   Resp 15   Ht 5\' 8"  (1.727 m)   Wt 100.8 kg   SpO2 95%   BMI 33.79 kg/m  Physical Exam Vitals and nursing note reviewed.   63 year old male, resting comfortably and in no acute distress. Vital signs are significant for elevated temperature and heart rate. Oxygen saturation is 95%, which is normal. Head is normocephalic and atraumatic. PERRLA, EOMI. Oropharynx is clear. Neck is nontender and supple without adenopathy. Back is nontender and there is no CVA tenderness. Lungs are clear without rales, wheezes, or rhonchi. Chest is nontender. Heart has regular rate and rhythm without murmur. Abdomen is soft, flat, nontender. Extremities: Right below the knee amputation, no cyanosis or edema.  Good dorsalis pedis pulse on the left foot.  No obvious lesions noted on the foot. Skin is warm and dry.  Skin is generally dry and flaky, but no  rash. Neurologic: Awake and alert, oriented to person and place and mostly oriented to time, knows it is January but thought it was 2024), cranial nerves are intact, moves all extremities equally.  ED Results / Procedures / Treatments   Labs (all labs ordered are listed, but only abnormal results are displayed) Labs Reviewed  RESP PANEL BY RT-PCR (RSV, FLU A&B, COVID)  RVPGX2 - Abnormal; Notable for the following components:      Result Value   Influenza A by PCR POSITIVE (*)    All other components within normal limits  COMPREHENSIVE METABOLIC PANEL - Abnormal; Notable for the following components:   CO2 21 (*)    Glucose, Bld 193 (*)  AST 53 (*)    ALT 46 (*)    All other components within normal limits  CBC WITH DIFFERENTIAL/PLATELET - Abnormal; Notable for the following components:   RDW 15.9 (*)    Platelets 118 (*)    Lymphs Abs 0.3 (*)    All other components within normal limits  CBG MONITORING, ED - Abnormal; Notable for the following components:   Glucose-Capillary 204 (*)    All other components within normal limits  CULTURE, BLOOD (ROUTINE X 2)  CULTURE, BLOOD (ROUTINE X 2)  PROTIME-INR  APTT  URINALYSIS, W/ REFLEX TO CULTURE (INFECTION SUSPECTED)  I-STAT CG4 LACTIC ACID, ED    EKG EKG Interpretation Date/Time:  Sunday March 17 2023 03:45:16 EST Ventricular Rate:  109 PR Interval:  133 QRS Duration:  87 QT Interval:  323 QTC Calculation: 435 R Axis:   81  Text Interpretation: Sinus tachycardia Borderline right axis deviation When compared with ECG of 02/13/2023, HEART RATE has increased Confirmed by Dione Booze ([id number]) on 03/17/2023 4:12:39 AM  Radiology DG Chest Port 1 View Result Date: 03/17/2023 CLINICAL DATA:  Questionable sepsis. EXAM: PORTABLE CHEST 1 VIEW COMPARISON:  AP Lat 02/13/2023 FINDINGS: There is a tangle of multiple overlying monitor wires. The heart is slightly enlarged without evidence of CHF. There is a low inspiration. Bronchovascular  crowding is seen in the bases but no convincing focal pneumonia. The mediastinum is normally outlined. No pleural effusion is seen. Thoracic spondylosis. IMPRESSION: 1. Low inspiration with bronchovascular crowding in the bases. No convincing focal pneumonia. 2. Mild cardiomegaly without evidence of CHF. Electronically Signed   By: Almira Bar M.D.   On: 03/17/2023 04:45    Procedures Procedures  Cardiac monitor shows sinus tachycardia, per my interpretation.  Medications Ordered in ED Medications - No data to display  ED Course/ Medical Decision Making/ A&P                                 Medical Decision Making Amount and/or Complexity of Data Reviewed Labs: ordered. Radiology: ordered.  Risk OTC drugs.   Fever with some disorientation concerning for sepsis.  No obvious source of sepsis noted on exam but he does have cough and there is report of urinary difficulty at home.  I have ordered the evolving sepsis pathway be initiated and I have ordered acetaminophen for his fever.  Temperature has come down with acetaminophen.  I have reviewed his laboratory test, and my interpretation is normal lactic acid level,-no evidence of sepsis, mild elevation of transaminases of uncertain clinical significance, normal WBC and hemoglobin, mild thrombocytopenia of uncertain cause but likely related to viral infection, urinalysis pending.  Positive PCR for influenza A-this is apparently what is causing his symptoms.  Negative PCR for influenza B, respiratory syncytial virus, COVID-19.  While in the emergency department, blood pressure did drop down as low as 86 systolic.  I ordered IV fluids, and following this the blood pressure has come back up.  Urinalysis is pending.  I need to make sure that he does not have a concurrent UTI.  Case is signed out to Dr. Posey Rea.  CRITICAL CARE Performed by: Dione Booze Total critical care time: 35 minutes Critical care time was exclusive of separately billable  procedures and treating other patients. Critical care was necessary to treat or prevent imminent or life-threatening deterioration. Critical care was time spent personally by me on the following activities: development of  treatment plan with patient and/or surrogate as well as nursing, discussions with consultants, evaluation of patient's response to treatment, examination of patient, obtaining history from patient or surrogate, ordering and performing treatments and interventions, ordering and review of laboratory studies, ordering and review of radiographic studies, pulse oximetry and re-evaluation of patient's condition.  Final Clinical Impression(s) / ED Diagnoses Final diagnoses:  Influenza A  Fever in adult  Thrombocytopenia [facility])    Rx / DC Orders ED Discharge Orders     None         Dione Booze, MD 03/17/23 [phone]

## 2023-03-17 NOTE — H&P (Addendum)
History and Physical    Patient: Ricardo Howard ZOX:[id number] DOB: 1960/05/17 DOA: 03/17/2023 DOS: the patient was seen and examined on 03/17/2023 PCP: [facility]  Patient coming from: Home Medical readiness/disposition: Anticipate patient will be ready for discharge by 03/21/2023  Chief Complaint:  Chief Complaint  Patient presents with   Altered Mental Status   HPI: Ricardo Howard is a 63 y.o. male with medical history significant of hypertension, diabetes mellitus 2, tobacco use, HFrEF (45-50%), CAD, right BKA and prior Ozempic induced pancreatitis.  Patient and wife both report that he was in his usual state of health until yesterday when he began feeling ill.  He worsened overnight and EMS was called to the home in the early morning hours.  According to their report he was apparently experiencing altered mental status, urinary frequency and muscle pain and spasms.  He does not have any underlying pulmonary conditions but his O2 sats were 88% on room air.  At the time he arrived to the ED he was oriented person and place but thought the year was 31.  In the ER his temperature was 102.7 F, he had intermittent tachypnea, he was tachycardic with heart rates in the 110s to 115 range, his BP was 100/77 and he was maintaining O2 sats greater than 95% on 2 L of oxygen.  He did have 1 episode of systolic BP dropping to 86.  Labs revealed mild transaminitis with an AST of 53 and an ALT of 46 with a normal total bilirubin, creatinine was normal, CO2 was slightly elevated at 21 with a normal anion gap, glucose 193, platelets were low at 118,000.  He was also positive for influenza A.  Chest x-ray was unremarkable except for low inspiratory effort.  Blood cultures have been obtained due to concerns over patient may having sepsis either due to influenza physiology or to possible UTI noting patient was retaining urine.  Bladder scan revealed 500 cc.  EDP unable to advance the catheter so  urology has been consulted to assist with catheter placement.  Hospitalist team has been consulted to evaluate the patient for admission.   Review of Systems: As mentioned in the history of present illness. All other systems reviewed and are negative. Past Medical History:  Diagnosis Date   Alcohol abuse 06/14/2019   Anxiety    Back pain    Cataracts, bilateral    CHF (congestive heart failure) (HCC)    Depression    Diabetic retinopathy (HCC)    Essential hypertension 06/14/2019   Hepatitis C    cured   Hx of BKA, right (HCC)    Hyperlipidemia    Hypertension    Mixed hyperlipidemia due to type 2 diabetes mellitus (HCC) 07/31/2019   Nicotine dependence, cigarettes, uncomplicated 07/31/2019   PAD (peripheral artery disease) (HCC)    Pancreatitis    PTSD (post-traumatic stress disorder)    Uncontrolled type 2 diabetes mellitus with diabetic polyneuropathy, with long-term current use of insulin 07/31/2019   Past Surgical History:  Procedure Laterality Date   ABDOMINAL AORTOGRAM W/LOWER EXTREMITY N/A 12/02/2020   Procedure: ABDOMINAL AORTOGRAM W/LOWER EXTREMITY;  Surgeon: Leonie Douglas, MD;  Location: [facility] INVASIVE CV LAB;  Service: Cardiovascular;  Laterality: N/A;   AMPUTATION Right 03/13/2020   Procedure: TRANSMETATARSAL AMPUTATION;  Surgeon: Chuck Hint, MD;  Location: [facility] OR;  Service: Vascular;  Laterality: Right;   AMPUTATION Right 03/15/2020   Procedure: RIGHT BELOW KNEE AMPUTATION;  Surgeon: Chuck Hint, MD;  Location:  [facility] OR;  Service: Vascular;  Laterality: Right;   CORONARY ANGIOPLASTY WITH STENT PLACEMENT     CORONARY STENT INTERVENTION N/A 07/16/2021   Procedure: CORONARY STENT INTERVENTION;  Surgeon: Orbie Pyo, MD;  Location: [facility] INVASIVE CV LAB;  Service: Cardiovascular;  Laterality: N/A;   LEFT HEART CATH AND CORONARY ANGIOGRAPHY N/A 07/16/2021   Procedure: LEFT HEART CATH AND CORONARY ANGIOGRAPHY;  Surgeon: Orbie Pyo, MD;  Location:  [facility] INVASIVE CV LAB;  Service: Cardiovascular;  Laterality: N/A;   PERIPHERAL VASCULAR INTERVENTION  12/02/2020   Procedure: PERIPHERAL VASCULAR INTERVENTION;  Surgeon: Leonie Douglas, MD;  Location: [facility] INVASIVE CV LAB;  Service: Cardiovascular;;  Lt SFA, LEIA   Social History:  reports that he has been smoking cigarettes. He started smoking about 52 years ago. He has a 13 pack-year smoking history. He has never used smokeless tobacco. He reports that he does not currently use alcohol. He reports that he does not currently use drugs.  Allergies  Allergen Reactions   Iohexol Hives    Patient broke out in hives after injection of Omni 300, will need 13 hour pre-med in future   Ozempic (0.25 Or 0.5 Mg-Dose) [Semaglutide(0.25 Or 0.5mg -Dos)] Other (See Comments)    pancreatitis   Shellfish Allergy Hives   Milk (Cow) Diarrhea    Finding of gastrointestinal tract gas   Zestril [Lisinopril] Cough   Zocor [Simvastatin] Itching and Cough    Family History  Problem Relation Age of Onset   Other Neg Hx     Prior to Admission medications   Medication Sig Start Date End Date Taking? Authorizing Provider  acetaminophen (TYLENOL) 325 MG tablet Take 2 tablets (650 mg total) by mouth every 6 (six) hours as needed for mild pain (or Fever >/= 101). 06/20/22   Elgergawy, Leana Roe, MD  aspirin EC 81 MG tablet Take 81 mg by mouth in the morning.    [provider]  atorvastatin (LIPITOR) 80 MG tablet Take 1 tablet (80 mg total) by mouth at bedtime. 08/30/21   Wendall Stade, MD  buPROPion (WELLBUTRIN SR) 150 MG 12 hr tablet Take 150 mg by mouth 2 (two) times daily.    [provider]  clopidogrel (PLAVIX) 75 MG tablet Take 75 mg by mouth in the morning.    [provider]  empagliflozin (JARDIANCE) 25 MG TABS tablet Take 1 tablet (25 mg total) by mouth daily. Patient taking differently: Take 12.5 mg by mouth daily. 08/30/21   Wendall Stade, MD  finasteride (PROSCAR) 5 MG tablet  Take 5 mg by mouth in the morning.    [provider]  folic acid (FOLVITE) 1 MG tablet Take 1 tablet (1 mg total) by mouth daily. Patient taking differently: Take 1 mg by mouth in the morning. 10/11/19   Rodolph Bong, MD  furosemide (LASIX) 40 MG tablet Take 1 tablet (40 mg total) by mouth daily. Patient taking differently: Take 40 mg by mouth in the morning. Take 1 tablet (40 mg total) by mouth daily. 06/23/22   Elgergawy, Leana Roe, MD  gabapentin (NEURONTIN) 300 MG capsule Take 300 mg by mouth 3 (three) times daily. Take 1 capsule (300 mg) by mouth in the morning, take 1 capsule (300 mg) by mouth at lunch, & take 2 capsules (600 mg) by mouth at bedtime    [provider]  guaiFENesin (MUCINEX) 600 MG 12 hr tablet Take 1 tablet (600 mg total) by mouth 2 (two) times daily. 02/14/23  Osvaldo Shipper, MD  insulin aspart (NOVOLOG) 100 UNIT/ML injection Inject 10 Units into the skin 3 (three) times daily with meals. 02/14/23   Osvaldo Shipper, MD  insulin glargine (LANTUS) 100 UNIT/ML Solostar Pen 30units in am and 35unit at night  Further adjustment per your pcp and endocrinology Goal of a1c is less than 7%, currently your a1c is 10%, not at goal Patient taking differently: Inject 65 Units into the skin daily. Further adjustment per your pcp and endocrinology Goal of a1c is less than 7%, currently your a1c is 10%, not at goal 06/03/21   Albertine Grates, MD  Insulin Pen Needle (NOVOFINE) 30G X 8 MM MISC Inject 10 each into the skin as needed. 10/10/19   Rodolph Bong, MD  metoprolol tartrate (LOPRESSOR) 100 MG tablet Take 50 mg by mouth in the morning.    [provider]  Multiple Vitamin (QUINTABS) TABS Take 1 tablet by mouth daily.    [provider]  naproxen sodium (ALEVE) 220 MG tablet Take 220 mg by mouth daily as needed (headache).    [provider]  nitroGLYCERIN (NITROSTAT) 0.4 MG SL tablet Place 1 tablet (0.4 mg total) under the tongue every 5  (five) minutes as needed for chest pain. Patient not taking: Reported on 02/13/2023 07/19/21   Evlyn Kanner, MD  potassium chloride SA (KLOR-CON M) 20 MEQ tablet Take 1 tablet (20 mEq total) by mouth daily. Patient taking differently: Take 20 mEq by mouth daily after lunch. 08/31/21   Manson Passey, PA  ranolazine (RANEXA) 500 MG 12 hr tablet Take 500 mg by mouth 2 (two) times daily. 09/27/21   [provider]  sacubitril-valsartan (ENTRESTO) 24-26 MG Take 1 tablet by mouth 2 (two) times daily. 06/25/22   Elgergawy, Leana Roe, MD  sertraline (ZOLOFT) 100 MG tablet Take 150 mg by mouth daily.    [provider]  thiamine (VITAMIN B-1) 100 MG tablet Take 1 tablet (100 mg total) by mouth daily. 02/15/23   Osvaldo Shipper, MD    Physical Exam: Vitals:   03/17/23 0945 03/17/23 1000 03/17/23 1130 03/17/23 1136  BP: 114/76 130/76 (!) 141/84   Pulse: 97 94 95   Resp: (!) 22 (!) 23 (!) 26   Temp:   100 F (37.8 C)   TempSrc:   Oral   SpO2: 95% 95% 97% 97%  Weight:      Height:       Constitutional: Pleasant but clearly uncomfortable secondary to ongoing rigors and muscle spasms Respiratory: clear to auscultation bilaterally, no wheezing, no crackles.  Managed in the bases normal respiratory effort. No accessory muscle use.  3 L O2 Cardiovascular: Regular rate and rhythm, no murmurs / rubs / gallops. No extremity edema. 2+ pedal pulses.  Abdomen: no tenderness, no masses palpated. No hepatosplenomegaly. Bowel sounds positive.  Musculoskeletal: no clubbing / cyanosis. No joint deformity upper and lower extremities. Good ROM, no contractures. Normal muscle tone.  Skin: no rashes, lesions, ulcers. No induration Neurologic: CN 2-12 grossly intact. Sensation intact, DTR normal. Strength 5/5 x all 4 extremities.  Psychiatric: Alert and oriented x 3.   Data Reviewed:  As per HPI Sodium 135, potassium 4.3, BUN 10, creatinine 1.14, GFR greater than 60  WBC 5.4, hemoglobin 16,  platelets 118,000, PT/PTT/INR normal  Assessment and Plan: Sepsis Patient presents with tachypnea, tachycardia and intermittent suboptimal blood pressure readings in the context of influenza and possible UTI Patient also presented with significantly elevated temperature of 102.7 and this  could explain the tachypnea and the tachycardia -for now will treat as evolving sepsis Continue volume replacement Lactic acid is normal-renal function is normal-patient does have thrombocytopenia (see below) Blood cultures are pending once Foley catheter placed a urine culture will be obtained as well; chest x-ray without evidence of pneumonia Treat underlying causes Admit to progressive care  Acute hypoxemic respiratory failure secondary to influenza A Current O2 requirements are 3 L/min with normal chest x-ray and lungs clear on pulmonary exam Continue supportive care: Oxygen, mucolytic's, IV Toradol to help with rigors and muscle pain noting CK is normal and Tamiflu  Acute urinary retention/BPH Noted with at least 500 cc on bladder scan BP unable to pass urinary catheter-urology consulted to assist with catheter placement Urinalysis and urine culture have been ordered Unclear if remains on Proscar  Acute thrombocytopenia Likely due to hypoperfusion from sepsis physiology and/or from possible gram-negative infection i.e. UTI Continue to follow If platelets drop below 100,000 will need to stop pharmacological DVT prophylaxis Currently no acute signs of bleeding  Mild nonobstructive transaminitis Likely due to hypoperfusion from sepsis physiology Continue to follow labs  Diabetes mellitus 2 Awaiting med reconciliation confirmation For now we will check CBGs and provide SSI Hemoglobin A1c on 02/14/2023 was 10.9 Unclear if remains on Lantus and Jardiance Previously had drug-induced pancreatitis from Ozempic  Regular alcohol use Reported on previous admission that he drinks about 3 daily.   During that hospitalization he had no evidence of withdrawal Will begin by checking CIWA scores and if become elevated then can introduce Ativan CIWA protocol  HFrEF Awaiting med reconciliation ordering home medications Currently does not appear to be volume overloaded EF 45 to 50% on echocardiogram from 2023 Unclear if still on Entresto, Lasix or Lopressor  Hypertension/CAD Home medications need to be confirmed on medication reconciliation Holding initially due to presentation with sepsis physiology No complaints of chest pain Unclear if remains on Plavix or Ranexa  Prior right BKA with prosthesis    Advance Care Planning:   Code Status: Full Code   VTE prophylaxis: Lovenox  Consults: Urology  Family Communication: Wife at bedside: Lea Swain-Gallick [phone]  Severity of Illness: The appropriate patient status for this patient is INPATIENT. Inpatient status is judged to be reasonable and necessary in order to provide the required intensity of service to ensure the patient's safety. The patient's presenting symptoms, physical exam findings, and initial radiographic and laboratory data in the context of their chronic comorbidities is felt to place them at high risk for further clinical deterioration. Furthermore, it is not anticipated that the patient will be medically stable for discharge from the hospital within 2 midnights of admission.   * I certify that at the point of admission it is my clinical judgment that the patient will require inpatient hospital care spanning beyond 2 midnights from the point of admission due to high intensity of service, high risk for further deterioration and high frequency of surveillance required.*  Author: Junious Silk, NP 03/17/2023 12:21 PM  For on call review [email or web address].

## 2023-03-17 NOTE — ED Triage Notes (Signed)
Pt BIB EMS from home with c/o AMS, urinary frequency, and muscle pain/spasms. Pt's O2 was 88% on RA, pt placed 2lpm [place] O2 increased to mid 90s. A&O to person and place, states that it is 26

## 2023-03-17 NOTE — ED Notes (Signed)
Foley placed by urology. 1400 ml output.

## 2023-03-17 NOTE — Consult Note (Signed)
Urology Consult  Referring physician: Dr. Karene Fry Reason for referral: urinary retention, difficult foley   Chief Complaint: difficulty urinating  History of Present Illness: [name] is a 62yo with a history of DMII, CHF, and BPH who presented to the ER with fevers. At the time of presentation he was found to be in urinary retention and multiple attempts were made by nursing to place a foley which were unsuccessful. The patient denies any worsening LUTS prior to admission. He is currently on flomax 0.4mg  daily. He denies  any hstory of UTI. No hematuria or dysuria  Past Medical History:  Diagnosis Date   Alcohol abuse 06/14/2019   Anxiety    Back pain    Cataracts, bilateral    CHF (congestive heart failure) (HCC)    Depression    Diabetic retinopathy (HCC)    Essential hypertension 06/14/2019   Hepatitis C    cured   Hx of BKA, right (HCC)    Hyperlipidemia    Hypertension    Mixed hyperlipidemia due to type 2 diabetes mellitus (HCC) 07/31/2019   Nicotine dependence, cigarettes, uncomplicated 07/31/2019   PAD (peripheral artery disease) (HCC)    Pancreatitis    PTSD (post-traumatic stress disorder)    Uncontrolled type 2 diabetes mellitus with diabetic polyneuropathy, with long-term current use of insulin 07/31/2019   Past Surgical History:  Procedure Laterality Date   ABDOMINAL AORTOGRAM W/LOWER EXTREMITY N/A 12/02/2020   Procedure: ABDOMINAL AORTOGRAM W/LOWER EXTREMITY;  Surgeon: Leonie Douglas, MD;  Location: [facility];  Service: Cardiovascular;  Laterality: N/A;   AMPUTATION Right 03/13/2020   Procedure: TRANSMETATARSAL AMPUTATION;  Surgeon: Chuck Hint, MD;  Location: [facility] OR;  Service: Vascular;  Laterality: Right;   AMPUTATION Right 03/15/2020   Procedure: RIGHT BELOW KNEE AMPUTATION;  Surgeon: Chuck Hint, MD;  Location: [facility] OR;  Service: Vascular;  Laterality: Right;   CORONARY ANGIOPLASTY WITH STENT PLACEMENT     CORONARY STENT  INTERVENTION N/A 07/16/2021   Procedure: CORONARY STENT INTERVENTION;  Surgeon: Orbie Pyo, MD;  Location: [facility];  Service: Cardiovascular;  Laterality: N/A;   LEFT HEART CATH AND CORONARY ANGIOGRAPHY N/A 07/16/2021   Procedure: LEFT HEART CATH AND CORONARY ANGIOGRAPHY;  Surgeon: Orbie Pyo, MD;  Location: [facility];  Service: Cardiovascular;  Laterality: N/A;   PERIPHERAL VASCULAR INTERVENTION  12/02/2020   Procedure: PERIPHERAL VASCULAR INTERVENTION;  Surgeon: Leonie Douglas, MD;  Location: [facility];  Service: Cardiovascular;;  Lt SFA, LEIA    Medications: I have reviewed the patient's current medications. Allergies:  Allergies  Allergen Reactions   Iohexol Hives    Patient broke out in hives after injection of Omni 300, will need 13 hour pre-med in future   Ozempic (0.25 Or 0.5 Mg-Dose) [Semaglutide(0.25 Or 0.5mg -Dos)] Other (See Comments)    pancreatitis   Shellfish Allergy Hives   Milk (Cow) Diarrhea    Finding of gastrointestinal tract gas   Zestril [Lisinopril] Cough   Zocor [Simvastatin] Itching and Cough    Family History  Problem Relation Age of Onset   Other Neg Hx    Social History:  reports that he has been smoking cigarettes. He started smoking about 52 years ago. He has a 13 pack-year smoking history. He has never used smokeless tobacco. He reports that he does not currently use alcohol. He reports that he does not currently use drugs.  Review of Systems  Respiratory:  Positive for shortness of breath  and wheezing.   Genitourinary:  Positive for difficulty urinating.  All other systems reviewed and are negative.   Physical Exam:  Vital signs in last 24 hours: Temp:  [98.9 F (37.2 C)-102.7 F (39.3 C)] 98.9 F (37.2 C) (01/26 1500) Pulse Rate:  [91-111] 106 (01/26 1700) Resp:  [14-34] 23 (01/26 1700) BP: (86-170)/(56-116) 136/94 (01/26 1700) SpO2:  [88 %-100 %] 97 % (01/26 1700) Weight:  [100.8 kg] 100.8 kg (01/26  0344) Physical Exam Vitals reviewed.  Constitutional:      Appearance: Normal appearance.  HENT:     Head: Normocephalic and atraumatic.     Nose: Nose normal.     Mouth/Throat:     Mouth: Mucous membranes are dry.  Eyes:     Extraocular Movements: Extraocular movements intact.     Pupils: Pupils are equal, round, and reactive to light.  Cardiovascular:     Rate and Rhythm: Normal rate and regular rhythm.  Abdominal:     General: Abdomen is flat. There is no distension.  Musculoskeletal:        General: No swelling. Normal range of motion.     Cervical back: Normal range of motion and neck supple.  Skin:    General: Skin is warm and dry.  Neurological:     General: No focal deficit present.     Mental Status: He is alert and oriented to person, place, and time.  Psychiatric:        Mood and Affect: Mood normal.        Behavior: Behavior normal.        Thought Content: Thought content normal.        Judgment: Judgment normal.     Laboratory Data:  Results for orders placed or performed during the hospital encounter of 03/17/23 (from the past 72 hours)  Comprehensive metabolic panel     Status: Abnormal   Collection Time: 03/17/23  3:50 AM  Result Value Ref Range   Sodium 135 135 - 145 mmol/L   Potassium 4.3 3.5 - 5.1 mmol/L   Chloride 103 98 - 111 mmol/L   CO2 21 (L) 22 - 32 mmol/L   Glucose, Bld 193 (H) 70 - 99 mg/dL    Comment: Glucose reference range applies only to samples taken after fasting for at least 8 hours.   BUN 10 8 - 23 mg/dL   Creatinine, Ser 1.61 0.61 - 1.24 mg/dL   Calcium 9.0 8.9 - 09.6 mg/dL   Total Protein 7.4 6.5 - 8.1 g/dL   Albumin 3.6 3.5 - 5.0 g/dL   AST 53 (H) 15 - 41 U/L   ALT 46 (H) 0 - 44 U/L   Alkaline Phosphatase 84 38 - 126 U/L   Total Bilirubin 0.8 0.0 - 1.2 mg/dL   GFR, Estimated >04 >54 mL/min    Comment: (NOTE) Calculated using the CKD-EPI Creatinine Equation (2021)    Anion gap 11 5 - 15    Comment: Performed at [facility] Lab, [address] [place], [place] [address]  CBC with Differential     Status: Abnormal   Collection Time: 03/17/23  3:50 AM  Result Value Ref Range   WBC 5.4 4.0 - 10.5 K/uL   RBC 5.14 4.22 - 5.81 MIL/uL   Hemoglobin 16.0 13.0 - 17.0 g/dL   HCT 91.4 78.2 - 95.6 %   MCV 92.6 80.0 - 100.0 fL   MCH 31.1 26.0 - 34.0 pg   MCHC 33.6 30.0 -  36.0 g/dL   RDW 16.1 (H) 09.6 - 04.5 %   Platelets 118 (L) 150 - 400 K/uL    Comment: REPEATED TO VERIFY   nRBC 0.0 0.0 - 0.2 %   Neutrophils Relative % 83 %   Neutro Abs 4.6 1.7 - 7.7 K/uL   Lymphocytes Relative 5 %   Lymphs Abs 0.3 (L) 0.7 - 4.0 K/uL   Monocytes Relative 9 %   Monocytes Absolute 0.5 0.1 - 1.0 K/uL   Eosinophils Relative 1 %   Eosinophils Absolute 0.1 0.0 - 0.5 K/uL   Basophils Relative 1 %   Basophils Absolute 0.0 0.0 - 0.1 K/uL   Immature Granulocytes 1 %   Abs Immature Granulocytes 0.04 0.00 - 0.07 K/uL    Comment: Performed at [facility], [address] [place], [place] [address]  Protime-INR     Status: None   Collection Time: 03/17/23  3:50 AM  Result Value Ref Range   Prothrombin Time 13.9 11.4 - 15.2 seconds   INR 1.1 0.8 - 1.2    Comment: (NOTE) INR goal varies based on device and disease states. Performed at [facility], [address] [place], [place] [address]   APTT     Status: None   Collection Time: 03/17/23  3:50 AM  Result Value Ref Range   aPTT 28 24 - 36 seconds    Comment: Performed at [facility], [address] [place], [place] [address]  Resp panel by RT-PCR (RSV, Flu A&B, Covid) Anterior Nasal Swab     Status: Abnormal   Collection Time: 03/17/23  3:51 AM   Specimen: Anterior Nasal Swab  Result Value Ref Range   SARS Coronavirus 2 by RT PCR NEGATIVE NEGATIVE   Influenza A by PCR POSITIVE (A) NEGATIVE   Influenza B by PCR NEGATIVE NEGATIVE    Comment: (NOTE) The Xpert Xpress SARS-CoV-2/FLU/RSV plus assay is intended as an aid in the diagnosis of influenza from  Nasopharyngeal swab specimens and should not be used as a sole basis for treatment. Nasal washings and aspirates are unacceptable for Xpert Xpress SARS-CoV-2/FLU/RSV testing.  Fact Sheet for Patients: BloggerCourse.com  Fact Sheet for Healthcare Providers: SeriousBroker.it  This test is not yet approved or cleared by the [place] FDA and has been authorized for detection and/or diagnosis of SARS-CoV-2 by FDA under an Emergency Use Authorization (EUA). This EUA will remain in effect (meaning this test can be used) for the duration of the COVID-19 declaration under Section 564(b)(1) of the Act, 21 U.S.C. section 360bbb-3(b)(1), unless the authorization is terminated or revoked.     Resp Syncytial Virus by PCR NEGATIVE NEGATIVE    Comment: (NOTE) Fact Sheet for Patients: BloggerCourse.com  Fact Sheet for Healthcare Providers: SeriousBroker.it  This test is not yet approved or cleared by the [place] FDA and has been authorized for detection and/or diagnosis of SARS-CoV-2 by FDA under an Emergency Use Authorization (EUA). This EUA will remain in effect (meaning this test can be used) for the duration of the COVID-19 declaration under Section 564(b)(1) of the Act, 21 U.S.C. section 360bbb-3(b)(1), unless the authorization is terminated or revoked.  Performed at [facility], [address] [place], [place] [address]   CBG monitoring, ED     Status: Abnormal   Collection Time: 03/17/23  3:52 AM  Result Value Ref Range   Glucose-Capillary 204 (H) 70 - 99 mg/dL    Comment: Glucose reference range applies  only to samples taken after fasting for at least 8 hours.  CK     Status: None   Collection Time: 03/17/23  3:55 AM  Result Value Ref Range   Total CK 53 49 - 397 U/L    Comment: Performed at [facility] Lab, [address], [place], [place] [address]  I-Stat  Lactic Acid, ED     Status: None   Collection Time: 03/17/23  4:27 AM  Result Value Ref Range   Lactic Acid, Venous 1.4 0.5 - 1.9 mmol/L  CBC     Status: Abnormal   Collection Time: 03/17/23 12:01 PM  Result Value Ref Range   WBC 5.1 4.0 - 10.5 K/uL   RBC 4.67 4.22 - 5.81 MIL/uL   Hemoglobin 14.4 13.0 - 17.0 g/dL   HCT 60.4 54.0 - 98.1 %   MCV 93.8 80.0 - 100.0 fL   MCH 30.8 26.0 - 34.0 pg   MCHC 32.9 30.0 - 36.0 g/dL   RDW 19.1 (H) 47.8 - 29.5 %   Platelets 109 (L) 150 - 400 K/uL    Comment: REPEATED TO VERIFY   nRBC 0.0 0.0 - 0.2 %    Comment: Performed at [facility], [address], [place], [place] [address]  Creatinine, serum     Status: None   Collection Time: 03/17/23 12:01 PM  Result Value Ref Range   Creatinine, Ser 0.94 0.61 - 1.24 mg/dL   GFR, Estimated >86 >57 mL/min    Comment: (NOTE) Calculated using the CKD-EPI Creatinine Equation (2021) Performed at [facility], [address] [place], [place] [address]   Urinalysis, w/ Reflex to Culture (Infection Suspected) -Urine, Clean Catch     Status: Abnormal   Collection Time: 03/17/23  1:09 PM  Result Value Ref Range   Specimen Source URINE, CLEAN CATCH    Color, Urine YELLOW YELLOW   APPearance CLEAR CLEAR   Specific Gravity, Urine 1.031 (H) 1.005 - 1.030   pH 5.0 5.0 - 8.0   Glucose, UA >=500 (A) NEGATIVE mg/dL   Hgb urine dipstick NEGATIVE NEGATIVE   Bilirubin Urine NEGATIVE NEGATIVE   Ketones, ur NEGATIVE NEGATIVE mg/dL   Protein, ur NEGATIVE NEGATIVE mg/dL   Nitrite NEGATIVE NEGATIVE   Leukocytes,Ua NEGATIVE NEGATIVE   RBC / HPF 0-5 0 - 5 RBC/hpf   WBC, UA 0-5 0 - 5 WBC/hpf    Comment:        Reflex urine culture not performed if WBC <=10, OR if Squamous epithelial cells >5. If Squamous epithelial cells >5 suggest recollection.    Bacteria, UA NONE SEEN NONE SEEN   Squamous Epithelial / HPF 0-5 0 - 5 /HPF    Comment: Performed at [facility], [address], [place],  [place] [address]  Lactic acid, plasma     Status: None   Collection Time: 03/17/23  1:09 PM  Result Value Ref Range   Lactic Acid, Venous 1.3 0.5 - 1.9 mmol/L    Comment: Performed at [facility], [address] [place], [place] [address]  Procalcitonin     Status: None   Collection Time: 03/17/23  1:09 PM  Result Value Ref Range   Procalcitonin <0.10 ng/mL    Comment:        Interpretation: PCT (Procalcitonin) <= 0.5 ng/mL: Systemic infection (sepsis) is not likely. Local bacterial infection is possible. (NOTE)       Sepsis PCT Algorithm  Lower Respiratory Tract                                      Infection PCT Algorithm    ----------------------------     ----------------------------         PCT < 0.25 ng/mL                PCT < 0.10 ng/mL          Strongly encourage             Strongly discourage   discontinuation of antibiotics    initiation of antibiotics    ----------------------------     -----------------------------       PCT 0.25 - 0.50 ng/mL            PCT 0.10 - 0.25 ng/mL               OR       >80% decrease in PCT            Discourage initiation of                                            antibiotics      Encourage discontinuation           of antibiotics    ----------------------------     -----------------------------         PCT >= 0.50 ng/mL              PCT 0.26 - 0.50 ng/mL               AND        <80% decrease in PCT             Encourage initiation of                                             antibiotics       Encourage continuation           of antibiotics    ----------------------------     -----------------------------        PCT >= 0.50 ng/mL                  PCT > 0.50 ng/mL               AND         increase in PCT                  Strongly encourage                                      initiation of antibiotics    Strongly encourage escalation           of antibiotics                                      -----------------------------  PCT <= 0.25 ng/mL                                                 OR                                        > 80% decrease in PCT                                      Discontinue / Do not initiate                                             antibiotics  Performed at [facility], [address] [place], [place] [address]   C-reactive protein     Status: Abnormal   Collection Time: 03/17/23  1:09 PM  Result Value Ref Range   CRP 1.7 (H) <1.0 mg/dL    Comment: Performed at [facility], [address] [place], [place] [address]   Recent Results (from the past 240 hours)  Resp panel by RT-PCR (RSV, Flu A&B, Covid) Anterior Nasal Swab     Status: Abnormal   Collection Time: 03/17/23  3:51 AM   Specimen: Anterior Nasal Swab  Result Value Ref Range Status   SARS Coronavirus 2 by RT PCR NEGATIVE NEGATIVE Final   Influenza A by PCR POSITIVE (A) NEGATIVE Final   Influenza B by PCR NEGATIVE NEGATIVE Final    Comment: (NOTE) The Xpert Xpress SARS-CoV-2/FLU/RSV plus assay is intended as an aid in the diagnosis of influenza from Nasopharyngeal swab specimens and should not be used as a sole basis for treatment. Nasal washings and aspirates are unacceptable for Xpert Xpress SARS-CoV-2/FLU/RSV testing.  Fact Sheet for Patients: BloggerCourse.com  Fact Sheet for Healthcare Providers: SeriousBroker.it  This test is not yet approved or cleared by the [place] FDA and has been authorized for detection and/or diagnosis of SARS-CoV-2 by FDA under an Emergency Use Authorization (EUA). This EUA will remain in effect (meaning this test can be used) for the duration of the COVID-19 declaration under Section 564(b)(1) of the Act, 21 U.S.C. section 360bbb-3(b)(1), unless the authorization is terminated or revoked.     Resp Syncytial Virus by PCR NEGATIVE  NEGATIVE Final    Comment: (NOTE) Fact Sheet for Patients: BloggerCourse.com  Fact Sheet for Healthcare Providers: SeriousBroker.it  This test is not yet approved or cleared by the [place] FDA and has been authorized for detection and/or diagnosis of SARS-CoV-2 by FDA under an Emergency Use Authorization (EUA). This EUA will remain in effect (meaning this test can be used) for the duration of the COVID-19 declaration under Section 564(b)(1) of the Act, 21 U.S.C. section 360bbb-3(b)(1), unless the authorization is terminated or revoked.  Performed at [facility], [address], [place], [place] [address]    Creatinine: Recent Labs    03/17/23 0350 03/17/23 1201  CREATININE 1.14 0.94   Baseline Creatinine: 0.9  Impression/Assessment:  62yo with BPH and urinary retention  Plan:  18 french coude catheter  placed without incident and 1400cc of clear urine drained. The patient should keep his foley in place for 1 week prior to attempting a voiding trial. Please increase flomax to 0.4mg  BID  Wilkie Aye 03/17/2023, 6:15 PM

## 2023-03-17 NOTE — ED Provider Notes (Signed)
  Physical Exam  BP 98/71   Pulse 100   Temp 100.2 F (37.9 C)   Resp 20   Ht 5\' 8"  (1.727 m)   Wt 100.8 kg   SpO2 97%   BMI 33.79 kg/m     Procedures  .Critical Care  Performed by: Ernie Avena, MD Authorized by: Ernie Avena, MD   Critical care provider statement:    Critical care time (minutes):  30   Critical care was time spent personally by me on the following activities:  Development of treatment plan with patient or surrogate, discussions with consultants, evaluation of patient's response to treatment, examination of patient, ordering and review of laboratory studies, ordering and review of radiographic studies, ordering and performing treatments and interventions, pulse oximetry, re-evaluation of patient's condition and review of old charts   Care discussed with: admitting provider     ED Course / MDM    Medical Decision Making Amount and/or Complexity of Data Reviewed Labs: ordered. Radiology: ordered.  Risk OTC drugs. Decision regarding hospitalization.   74M presenting with SIRS, Flu positive. Got sepsis workup, got 1L LR and waiting on a UA, needs reassessment.   The patient has been on able to void for over 70 hours.  His bladder scan revealed over 500 cc in the bladder.  He has mild suprapubic discomfort.  Nursing was unable to pass a Foley catheter or coud catheter.  Due to concern for acute urinary retention, urology was consulted, Dr. Ronne Binning will see the patient. Pt persistently borderline hypotensive, administered a second IVF bolus with good response. In the setting of being SIRS positive, flu positive, hypotensive requiring fluids and hypoxic respiratory failure in the setting of influenza, hospitalist medicine was consulted for admission.    Ernie Avena, MD 03/17/23 1049

## 2023-03-17 NOTE — ED Notes (Signed)
Urology at bedside.

## 2023-03-17 NOTE — ED Notes (Signed)
Pt's O2 ranging 87-89% on RA. Pt placed on 2lpm [place], O2 increased to 95%.

## 2023-03-17 NOTE — ED Notes (Signed)
Attempted I&O cath, unsuccessful x 2. EDP made aware. Condom cath placed.

## 2023-03-18 DIAGNOSIS — A419 Sepsis, unspecified organism: Secondary | ICD-10-CM | POA: Diagnosis not present

## 2023-03-18 DIAGNOSIS — R6520 Severe sepsis without septic shock: Secondary | ICD-10-CM | POA: Diagnosis not present

## 2023-03-18 LAB — COMPREHENSIVE METABOLIC PANEL
ALT: 97 U/L — ABNORMAL HIGH (ref 0–44)
AST: 127 U/L — ABNORMAL HIGH (ref 15–41)
Albumin: 3 g/dL — ABNORMAL LOW (ref 3.5–5.0)
Alkaline Phosphatase: 69 U/L (ref 38–126)
Anion gap: 10 (ref 5–15)
BUN: 13 mg/dL (ref 8–23)
CO2: 18 mmol/L — ABNORMAL LOW (ref 22–32)
Calcium: 8.6 mg/dL — ABNORMAL LOW (ref 8.9–10.3)
Chloride: 107 mmol/L (ref 98–111)
Creatinine, Ser: 0.76 mg/dL (ref 0.61–1.24)
GFR, Estimated: >60 mL/min (ref >60)
Glucose, Bld: 112 mg/dL — ABNORMAL HIGH (ref 70–99)
Potassium: 3.9 mmol/L (ref 3.5–5.1)
Sodium: 135 mmol/L (ref 135–145)
Total Bilirubin: 0.7 mg/dL (ref 0.0–1.2)
Total Protein: 6.7 g/dL (ref 6.5–8.1)

## 2023-03-18 LAB — URINE CULTURE: Culture: NO GROWTH

## 2023-03-18 LAB — CBG MONITORING, ED
Glucose-Capillary: 128 mg/dL — ABNORMAL HIGH (ref 70–99)
Glucose-Capillary: 219 mg/dL — ABNORMAL HIGH (ref 70–99)

## 2023-03-18 LAB — MAGNESIUM: Magnesium: 2 mg/dL (ref 1.7–2.4)

## 2023-03-18 LAB — PROCALCITONIN: Procalcitonin: <0.1 ng/mL

## 2023-03-18 LAB — GLUCOSE, CAPILLARY: Glucose-Capillary: 237 mg/dL — ABNORMAL HIGH (ref 70–99)

## 2023-03-18 MED ORDER — ATORVASTATIN CALCIUM 80 MG PO TABS
80.0000 mg | ORAL_TABLET | Freq: Every day | ORAL | Status: DC
  Administered 2023-03-18 – 2023-03-20 (×3): 80 mg via ORAL
  Filled 2023-03-18 (×3): qty 1

## 2023-03-18 MED ORDER — SERTRALINE HCL 50 MG PO TABS
150.0000 mg | ORAL_TABLET | Freq: Every day | ORAL | Status: DC
  Administered 2023-03-18 – 2023-03-21 (×4): 150 mg via ORAL
  Filled 2023-03-18 (×4): qty 1

## 2023-03-18 MED ORDER — FOLIC ACID 1 MG PO TABS
1.0000 mg | ORAL_TABLET | Freq: Every day | ORAL | Status: DC
  Administered 2023-03-18 – 2023-03-21 (×4): 1 mg via ORAL
  Filled 2023-03-18 (×4): qty 1

## 2023-03-18 MED ORDER — FUROSEMIDE 40 MG PO TABS
40.0000 mg | ORAL_TABLET | Freq: Every morning | ORAL | Status: DC
  Administered 2023-03-18 – 2023-03-21 (×4): 40 mg via ORAL
  Filled 2023-03-18: qty 2
  Filled 2023-03-18 (×3): qty 1

## 2023-03-18 MED ORDER — INSULIN GLARGINE-YFGN 100 UNIT/ML ~~LOC~~ SOLN
30.0000 [IU] | Freq: Every day | SUBCUTANEOUS | Status: DC
  Administered 2023-03-18 – 2023-03-21 (×4): 30 [IU] via SUBCUTANEOUS
  Filled 2023-03-18 (×4): qty 0.3

## 2023-03-18 MED ORDER — TAMSULOSIN HCL 0.4 MG PO CAPS
0.4000 mg | ORAL_CAPSULE | Freq: Two times a day (BID) | ORAL | Status: DC
  Administered 2023-03-18 – 2023-03-21 (×7): 0.4 mg via ORAL
  Filled 2023-03-18 (×7): qty 1

## 2023-03-18 MED ORDER — SACUBITRIL-VALSARTAN 24-26 MG PO TABS
1.0000 | ORAL_TABLET | Freq: Two times a day (BID) | ORAL | Status: DC
  Administered 2023-03-18 – 2023-03-21 (×7): 1 via ORAL
  Filled 2023-03-18 (×7): qty 1

## 2023-03-18 MED ORDER — ASPIRIN 81 MG PO TBEC
81.0000 mg | DELAYED_RELEASE_TABLET | Freq: Every morning | ORAL | Status: DC
  Administered 2023-03-18 – 2023-03-21 (×4): 81 mg via ORAL
  Filled 2023-03-18 (×4): qty 1

## 2023-03-18 MED ORDER — INSULIN ASPART 100 UNIT/ML IJ SOLN
0.0000 [IU] | Freq: Three times a day (TID) | INTRAMUSCULAR | Status: DC
  Administered 2023-03-18: 5 [IU] via SUBCUTANEOUS
  Administered 2023-03-18: 2 [IU] via SUBCUTANEOUS
  Administered 2023-03-19: 5 [IU] via SUBCUTANEOUS
  Administered 2023-03-19: 2 [IU] via SUBCUTANEOUS
  Administered 2023-03-19: 11 [IU] via SUBCUTANEOUS
  Administered 2023-03-20: 3 [IU] via SUBCUTANEOUS
  Administered 2023-03-20 (×2): 8 [IU] via SUBCUTANEOUS

## 2023-03-18 MED ORDER — INSULIN ASPART 100 UNIT/ML IJ SOLN
0.0000 [IU] | Freq: Every day | INTRAMUSCULAR | Status: DC
  Administered 2023-03-18: 2 [IU] via SUBCUTANEOUS
  Administered 2023-03-19: 4 [IU] via SUBCUTANEOUS

## 2023-03-18 MED ORDER — FINASTERIDE 5 MG PO TABS
5.0000 mg | ORAL_TABLET | Freq: Every morning | ORAL | Status: DC
  Administered 2023-03-18 – 2023-03-21 (×4): 5 mg via ORAL
  Filled 2023-03-18 (×4): qty 1

## 2023-03-18 MED ORDER — THIAMINE MONONITRATE 100 MG PO TABS
100.0000 mg | ORAL_TABLET | Freq: Every day | ORAL | Status: DC
  Administered 2023-03-18 – 2023-03-21 (×4): 100 mg via ORAL
  Filled 2023-03-18 (×4): qty 1

## 2023-03-18 MED ORDER — METOPROLOL TARTRATE 50 MG PO TABS
50.0000 mg | ORAL_TABLET | Freq: Every morning | ORAL | Status: DC
  Administered 2023-03-18 – 2023-03-21 (×4): 50 mg via ORAL
  Filled 2023-03-18 (×3): qty 1
  Filled 2023-03-18: qty 2

## 2023-03-18 MED ORDER — CLOPIDOGREL BISULFATE 75 MG PO TABS
75.0000 mg | ORAL_TABLET | Freq: Every morning | ORAL | Status: DC
Start: 2023-03-18 — End: 2023-03-21
  Administered 2023-03-18 – 2023-03-21 (×4): 75 mg via ORAL
  Filled 2023-03-18 (×4): qty 1

## 2023-03-18 NOTE — Progress Notes (Signed)
PROGRESS NOTE    Almus Woodham  MWU:[id number] DOB: 1960-10-22 DOA: 03/17/2023 PCP: [facility]   Brief Narrative:  HPI: Boden Stucky is a 63 y.o. male with medical history significant of hypertension, diabetes mellitus 2, tobacco use, HFrEF (45-50%), CAD, right BKA and prior Ozempic induced pancreatitis.  Patient and wife both report that he was in his usual state of health until yesterday when he began feeling ill.  He worsened overnight and EMS was called to the home in the early morning hours.  According to their report he was apparently experiencing altered mental status, urinary frequency and muscle pain and spasms.  He does not have any underlying pulmonary conditions but his O2 sats were 88% on room air.  At the time he arrived to the ED he was oriented person and place but thought the year was 71.   In the ER his temperature was 102.7 F, he had intermittent tachypnea, he was tachycardic with heart rates in the 110s to 115 range, his BP was 100/77 and he was maintaining O2 sats greater than 95% on 2 L of oxygen.  He did have 1 episode of systolic BP dropping to 86.  Labs revealed mild transaminitis with an AST of 53 and an ALT of 46 with a normal total bilirubin, creatinine was normal, CO2 was slightly elevated at 21 with a normal anion gap, glucose 193, platelets were low at 118,000.  He was also positive for influenza A.  Chest x-ray was unremarkable except for low inspiratory effort.  Blood cultures have been obtained due to concerns over patient may having sepsis either due to influenza physiology or to possible UTI noting patient was retaining urine.  Bladder scan revealed 500 cc.  EDP unable to advance the catheter so urology has been consulted to assist with catheter placement.  Hospitalist team has been consulted to evaluate the patient for admission.  Assessment & Plan:   Principal Problem:   Severe sepsis (HCC) Active Problems:   Diabetes mellitus with  diabetic neuropathy, with long-term current use of insulin (HCC)   Alcohol abuse   Elevated LFTs   Tobacco abuse   S/P BKA (below knee amputation) (HCC)   CAD S/P percutaneous coronary angioplasty   Benign prostatic hyperplasia with urinary obstruction  Severe sepsis and acute hypoxic respiratory failure secondary to influenza A infection, POA: Met severe sepsis criteria based on fever 102.7, tachypnea, tachycardia and acute hypoxic respiratory failure, source remains to be influenza A.  Lactic acid normal.  Improved somewhat, currently on 2 L of oxygen and saturating 96%.  Continue supportive care.  Continue IV hydration.  Wean oxygen as able to.   Acute urinary retention/BPH Noted with at least 500 cc on bladder scan, ED unable to place Foley catheter, urology consulted, Foley catheter inserted, he had 1400 cc output.  He was on Flomax PTA.  That has not been resumed.  Per urology, they recommended increasing Flomax to twice daily.  I have placed the order and resumed finasteride as well.  Urology recommended keeping indwelling Foley catheter for at least a week.   Acute thrombocytopenia Likely due to hypoperfusion from sepsis physiology and/or from possible.  Slight drop in platelets but no signs of bleeding.  Monitor daily.  Hopefully this will improve with improvement in sepsis parameters.   Elevated LFTs: Likely due to sepsis.  Treat underlying cause.  Monitor daily.  Slight elevation in AST and ALT again today.   Diabetes mellitus 2 Appears to  be on Jardiance as well as Lantus 35 units twice daily and NovoLog 10 units 3 times daily Premeal.   Hemoglobin A1c on 02/14/2023 was 10.9 Previously had drug-induced pancreatitis from Ozempic.  For now he is not on anything and I will start him on SSI as well as Lantus only 30 units in the morning.   Regular alcohol use Reported on previous admission that he drinks about 3 daily.  During that hospitalization he had no evidence of withdrawal.   No signs of withdrawal.  Continue CIWA protocol.   HFrEF EF 45 to 50% on echocardiogram from 2023.  Appears euvolemic.  Resume Entresto and metoprolol.   Hypertension/CAD Asymptomatic, resume home medications.  UTI ruled out   Prior right BKA with prosthesis  DVT prophylaxis: enoxaparin (LOVENOX) injection 40 mg Start: 03/17/23 1200   Code Status: Full Code  Family Communication:  None present at bedside.  Plan of care discussed with patient in length and he/she verbalized understanding and agreed with it.  Status is: Inpatient Remains inpatient appropriate because: Feels really weak and does not feel well yet.   Estimated body mass index is 33.79 kg/m as calculated from the following:   Height as of this encounter: 5\' 8"  (1.727 m).   Weight as of this encounter: 100.8 kg.    Nutritional Assessment: Body mass index is 33.79 kg/m.Marland Kitchen Seen by dietician.  I agree with the assessment and plan as outlined below: Nutrition Status:        . Skin Assessment: I have examined the patient's skin and I agree with the wound assessment as performed by the wound care RN as outlined below:    Consultants:  None  Procedures:  None  Antimicrobials:  Anti-infectives (From admission, onward)    Start     Dose/Rate Route Frequency Ordered Stop   03/17/23 1215  oseltamivir (TAMIFLU) capsule 30 mg        30 mg Oral 2 times daily 03/17/23 1213 03/22/23 0959         Subjective: Patient seen and examined, still in the ED.  Complains of generalized weakness, mild shortness of breath but no other complaint.  Objective: Vitals:   03/18/23 0730 03/18/23 0730 03/18/23 0800 03/18/23 0900  BP: 127/89  115/62 136/85  Pulse: 87  91 91  Resp: 18  15 17   Temp:  98.6 F (37 C)    TempSrc:  Oral    SpO2: 94%  100% 96%  Weight:      Height:        Intake/Output Summary (Last 24 hours) at 03/18/2023 1013 Last data filed at 03/18/2023 0272 Gross per 24 hour  Intake 1000 ml  Output  1725 ml  Net -725 ml   Filed Weights   03/17/23 0344  Weight: 100.8 kg    Examination:  General exam: Appears calm and comfortable  Respiratory system: Clear to auscultation. Respiratory effort normal. Cardiovascular system: S1 & S2 heard, RRR. No JVD, murmurs, rubs, gallops or clicks. No pedal edema. Gastrointestinal system: Abdomen is nondistended, soft and nontender. No organomegaly or masses felt. Normal bowel sounds heard. Central nervous system: Alert and oriented. No focal neurological deficits. Extremities: Symmetric 5 x 5 power. Skin: No rashes, lesions or ulcers Psychiatry: Judgement and insight appear normal. Mood & affect appropriate.    Data Reviewed: I have personally reviewed following labs and imaging studies  CBC: Recent Labs  Lab 03/17/23 0350 03/17/23 1201  WBC 5.4 5.1  NEUTROABS 4.6  --  HGB 16.0 14.4  HCT 47.6 43.8  MCV 92.6 93.8  PLT 118* 109*   Basic Metabolic Panel: Recent Labs  Lab 03/17/23 0350 03/17/23 1201 03/18/23 0517  NA 135  --  135  K 4.3  --  3.9  CL 103  --  107  CO2 21*  --  18*  GLUCOSE 193*  --  112*  BUN 10  --  13  CREATININE 1.14 0.94 0.76  CALCIUM 9.0  --  8.6*  MG  --   --  2.0   GFR: Estimated Creatinine Clearance: 108.8 mL/min (by C-G formula based on SCr of 0.76 mg/dL). Liver Function Tests: Recent Labs  Lab 03/17/23 0350 03/18/23 0517  AST 53* 127*  ALT 46* 97*  ALKPHOS 84 69  BILITOT 0.8 0.7  PROT 7.4 6.7  ALBUMIN 3.6 3.0*   No results for input(s): "LIPASE", "AMYLASE" in the last 168 hours. No results for input(s): "AMMONIA" in the last 168 hours. Coagulation Profile: Recent Labs  Lab 03/17/23 0350  INR 1.1   Cardiac Enzymes: Recent Labs  Lab 03/17/23 0355  CKTOTAL 53   BNP (last 3 results) No results for input(s): "PROBNP" in the last 8760 hours. HbA1C: No results for input(s): "HGBA1C" in the last 72 hours. CBG: Recent Labs  Lab 03/17/23 0352  GLUCAP 204*   Lipid Profile: No  results for input(s): "CHOL", "HDL", "LDLCALC", "TRIG", "CHOLHDL", "LDLDIRECT" in the last 72 hours. Thyroid Function Tests: No results for input(s): "TSH", "T4TOTAL", "FREET4", "T3FREE", "THYROIDAB" in the last 72 hours. Anemia Panel: No results for input(s): "VITAMINB12", "FOLATE", "FERRITIN", "TIBC", "IRON", "RETICCTPCT" in the last 72 hours. Sepsis Labs: Recent Labs  Lab 03/17/23 0427 03/17/23 1309 03/17/23 1748 03/18/23 0517  PROCALCITON  --  <0.10  --  <0.10  LATICACIDVEN 1.4 1.3 1.2  --     Recent Results (from the past 240 hours)  Blood Culture (routine x 2)     Status: None (Preliminary result)   Collection Time: 03/17/23  3:50 AM   Specimen: BLOOD  Result Value Ref Range Status   Specimen Description BLOOD LEFT ANTECUBITAL  Final   Special Requests   Final    BOTTLES DRAWN AEROBIC AND ANAEROBIC Blood Culture results may not be optimal due to an inadequate volume of blood received in culture bottles   Culture   Final    NO GROWTH 1 DAY Performed at [facility], [address] [place], [place] [address]    Report Status PENDING  Incomplete  Resp panel by RT-PCR (RSV, Flu A&B, Covid) Anterior Nasal Swab     Status: Abnormal   Collection Time: 03/17/23  3:51 AM   Specimen: Anterior Nasal Swab  Result Value Ref Range Status   SARS Coronavirus 2 by RT PCR NEGATIVE NEGATIVE Final   Influenza A by PCR POSITIVE (A) NEGATIVE Final   Influenza B by PCR NEGATIVE NEGATIVE Final    Comment: (NOTE) The Xpert Xpress SARS-CoV-2/FLU/RSV plus assay is intended as an aid in the diagnosis of influenza from Nasopharyngeal swab specimens and should not be used as a sole basis for treatment. Nasal washings and aspirates are unacceptable for Xpert Xpress SARS-CoV-2/FLU/RSV testing.  Fact Sheet for Patients: BloggerCourse.com  Fact Sheet for Healthcare Providers: SeriousBroker.it  This test is not yet approved or cleared by the  [place] FDA and has been authorized for detection and/or diagnosis of SARS-CoV-2 by FDA under an Emergency Use Authorization (EUA). This EUA will remain in effect (  meaning this test can be used) for the duration of the COVID-19 declaration under Section 564(b)(1) of the Act, 21 U.S.C. section 360bbb-3(b)(1), unless the authorization is terminated or revoked.     Resp Syncytial Virus by PCR NEGATIVE NEGATIVE Final    Comment: (NOTE) Fact Sheet for Patients: BloggerCourse.com  Fact Sheet for Healthcare Providers: SeriousBroker.it  This test is not yet approved or cleared by the [place] FDA and has been authorized for detection and/or diagnosis of SARS-CoV-2 by FDA under an Emergency Use Authorization (EUA). This EUA will remain in effect (meaning this test can be used) for the duration of the COVID-19 declaration under Section 564(b)(1) of the Act, 21 U.S.C. section 360bbb-3(b)(1), unless the authorization is terminated or revoked.  Performed at [facility], [address] [place], [place] [address]   Blood Culture (routine x 2)     Status: None (Preliminary result)   Collection Time: 03/17/23  3:55 AM   Specimen: BLOOD  Result Value Ref Range Status   Specimen Description BLOOD RIGHT ANTECUBITAL  Final   Special Requests   Final    BOTTLES DRAWN AEROBIC AND ANAEROBIC Blood Culture results may not be optimal due to an inadequate volume of blood received in culture bottles   Culture   Final    NO GROWTH 1 DAY Performed at [facility], [address], [place], [place] [address]    Report Status PENDING  Incomplete     Radiology Studies: DG Chest Port 1 View Result Date: 03/17/2023 CLINICAL DATA:  Questionable sepsis. EXAM: PORTABLE CHEST 1 VIEW COMPARISON:  AP Lat 02/13/2023 FINDINGS: There is a tangle of multiple overlying monitor wires. The heart is slightly enlarged without evidence of CHF. There is  a low inspiration. Bronchovascular crowding is seen in the bases but no convincing focal pneumonia. The mediastinum is normally outlined. No pleural effusion is seen. Thoracic spondylosis. IMPRESSION: 1. Low inspiration with bronchovascular crowding in the bases. No convincing focal pneumonia. 2. Mild cardiomegaly without evidence of CHF. Electronically Signed   By: Almira Bar M.D.   On: 03/17/2023 04:45    Scheduled Meds:  aspirin EC  81 mg Oral q AM   atorvastatin  80 mg Oral QHS   clopidogrel  75 mg Oral q AM   enoxaparin (LOVENOX) injection  40 mg Subcutaneous Q24H   finasteride  5 mg Oral q AM   folic acid  1 mg Oral Daily   furosemide  40 mg Oral q AM   gabapentin  300 mg Oral TID   methylPREDNISolone (SOLU-MEDROL) injection  40 mg Intravenous Daily   metoprolol tartrate  50 mg Oral q AM   oseltamivir  30 mg Oral BID   sacubitril-valsartan  1 tablet Oral BID   sertraline  150 mg Oral Daily   sodium chloride flush  3 mL Intravenous Q12H   tamsulosin  0.4 mg Oral BID   thiamine  100 mg Oral Daily   Continuous Infusions:  sodium chloride 125 mL/hr at 03/17/23 2125     LOS: 1 day   Hughie Closs, MD [facility]  03/18/2023, 10:13 AM   *Please note that this is a verbal dictation therefore any spelling or grammatical errors are due to the "Dragon Medical One" system interpretation.  Please page via Amion and do not message via secure chat for urgent patient care matters. Secure chat can be used for non urgent patient care matters.  How to contact the [facility] Attending or  Consulting provider 7A - 7P or covering provider during after hours 7P -7A, for this patient?  Check the care team in [facility] and look for a) attending/consulting [facility] provider listed and b) the [facility] team listed. Page or secure chat 7A-7P. Log into [email or web address] and use [place]'s universal password to access. If you do not have the password, please contact the hospital operator. Locate the [facility] provider you  are looking for under [facility] and page to a number that you can be directly reached. If you still have difficulty reaching the provider, please page the [facility] (Director on Call) for the Hospitalists listed on amion for assistance.

## 2023-03-18 NOTE — Progress Notes (Signed)
Patient ID: Ricardo Howard, male   DOB: 11/16/1960, 63 y.o.   MRN: [id number]    Subjective: Coude catheter placed yesterday by Dr. Ronne Binning.  Objective: Vital signs in last 24 hours: Temp:  [98.6 F (37 C)-98.9 F (37.2 C)] 98.6 F (37 C) (01/27 0730) Pulse Rate:  [82-106] 93 (01/27 1000) Resp:  [14-25] 18 (01/27 1000) BP: (106-171)/(62-116) 148/111 (01/27 1000) SpO2:  [94 %-100 %] 95 % (01/27 1000)  Intake/Output from previous day: 01/26 0701 - 01/27 0700 In: 2000 [IV Piggyback:2000] Out: 1725 [Urine:1725] Intake/Output this shift: No intake/output data recorded.  Physical Exam:  General: Alert and oriented GU: Urine draining well, urine clear  Lab Results: Recent Labs    03/17/23 0350 03/17/23 1201  HGB 16.0 14.4  HCT 47.6 43.8      Latest Ref Rng & Units 03/17/2023   12:01 PM 03/17/2023    3:50 AM 02/14/2023    4:21 AM  CBC  WBC 4.0 - 10.5 K/uL 5.1  5.4  5.4   Hemoglobin 13.0 - 17.0 g/dL 40.9  81.1  91.4   Hematocrit 39.0 - 52.0 % 43.8  47.6  42.6   Platelets 150 - 400 K/uL 109  118  162      BMET Recent Labs    03/17/23 0350 03/17/23 1201 03/18/23 0517  NA 135  --  135  K 4.3  --  3.9  CL 103  --  107  CO2 21*  --  18*  GLUCOSE 193*  --  112*  BUN 10  --  13  CREATININE 1.14 0.94 0.76  CALCIUM 9.0  --  8.6*     Studies/Results:  Assessment/Plan: 1) Urinary retention: Continue catheter and tamsulosin/finasteride.  Will arrange urologic f/u for a voiding trial in about a week assuming he is discharged by then.   LOS: 1 day   Crecencio Mc 03/18/2023, 11:55 AM

## 2023-03-18 NOTE — ED Notes (Signed)
Inpatient RN aware patient transferring in 5W at this time.

## 2023-03-19 DIAGNOSIS — R6520 Severe sepsis without septic shock: Secondary | ICD-10-CM | POA: Diagnosis not present

## 2023-03-19 DIAGNOSIS — J101 Influenza due to other identified influenza virus with other respiratory manifestations: Secondary | ICD-10-CM

## 2023-03-19 DIAGNOSIS — A419 Sepsis, unspecified organism: Secondary | ICD-10-CM | POA: Diagnosis not present

## 2023-03-19 LAB — CBC
HCT: 43.3 % (ref 39.0–52.0)
Hemoglobin: 14.7 g/dL (ref 13.0–17.0)
MCH: 30.6 pg (ref 26.0–34.0)
MCHC: 33.9 g/dL (ref 30.0–36.0)
MCV: 90.2 fL (ref 80.0–100.0)
Platelets: 114 10*3/uL — ABNORMAL LOW (ref 150–400)
RBC: 4.8 MIL/uL (ref 4.22–5.81)
RDW: 15.8 % — ABNORMAL HIGH (ref 11.5–15.5)
WBC: 7.5 10*3/uL (ref 4.0–10.5)
nRBC: 0 % (ref 0.0–0.2)

## 2023-03-19 LAB — RESPIRATORY PANEL BY PCR

## 2023-03-19 LAB — GLUCOSE, CAPILLARY
Glucose-Capillary: 145 mg/dL — ABNORMAL HIGH (ref 70–99)
Glucose-Capillary: 209 mg/dL — ABNORMAL HIGH (ref 70–99)
Glucose-Capillary: 309 mg/dL — ABNORMAL HIGH (ref 70–99)
Glucose-Capillary: 309 mg/dL — ABNORMAL HIGH (ref 70–99)

## 2023-03-19 LAB — C-REACTIVE PROTEIN: CRP: 7 mg/dL — ABNORMAL HIGH (ref <1.0)

## 2023-03-19 MED ORDER — OSELTAMIVIR PHOSPHATE 75 MG PO CAPS
75.0000 mg | ORAL_CAPSULE | Freq: Two times a day (BID) | ORAL | Status: DC
  Administered 2023-03-20 – 2023-03-21 (×3): 75 mg via ORAL
  Filled 2023-03-19 (×3): qty 1

## 2023-03-19 MED ORDER — CHLORHEXIDINE GLUCONATE CLOTH 2 % EX PADS
6.0000 | MEDICATED_PAD | Freq: Every day | CUTANEOUS | Status: DC
  Administered 2023-03-19 – 2023-03-21 (×3): 6 via TOPICAL

## 2023-03-19 MED ORDER — IPRATROPIUM-ALBUTEROL 0.5-2.5 (3) MG/3ML IN SOLN
3.0000 mL | Freq: Four times a day (QID) | RESPIRATORY_TRACT | Status: DC
  Administered 2023-03-19 – 2023-03-20 (×4): 3 mL via RESPIRATORY_TRACT
  Filled 2023-03-19 (×4): qty 3

## 2023-03-19 NOTE — Plan of Care (Signed)
  Problem: Coping: Goal: Ability to adjust to condition or change in health will improve Outcome: Progressing   Problem: Education: Goal: Knowledge of General Education information will improve Description: Including pain rating scale, medication(s)/side effects and non-pharmacologic comfort measures Outcome: Progressing   Problem: Clinical Measurements: Goal: Will remain free from infection Outcome: Progressing Goal: Respiratory complications will improve Outcome: Progressing   Problem: Activity: Goal: Risk for activity intolerance will decrease Outcome: Progressing   Problem: Safety: Goal: Ability to remain free from injury will improve Outcome: Progressing

## 2023-03-19 NOTE — Progress Notes (Signed)
PROGRESS NOTE    Ricardo Howard  WUJ:[id number] DOB: May 01, 1960 DOA: 03/17/2023 PCP: [facility]   Brief Narrative:  HPI: Ricardo Howard is a 63 y.o. male with medical history significant of hypertension, diabetes mellitus 2, tobacco use, HFrEF (45-50%), CAD, right BKA and prior Ozempic induced pancreatitis.  Patient and wife both report that he was in his usual state of health until yesterday when he began feeling ill.  He worsened overnight and EMS was called to the home in the early morning hours.  According to their report he was apparently experiencing altered mental status, urinary frequency and muscle pain and spasms.  He does not have any underlying pulmonary conditions but his O2 sats were 88% on room air.  At the time he arrived to the ED he was oriented person and place but thought the year was 30.   In the ER his temperature was 102.7 F, he had intermittent tachypnea, he was tachycardic with heart rates in the 110s to 115 range, his BP was 100/77 and he was maintaining O2 sats greater than 95% on 2 L of oxygen.  He did have 1 episode of systolic BP dropping to 86.  Labs revealed mild transaminitis with an AST of 53 and an ALT of 46 with a normal total bilirubin, creatinine was normal, CO2 was slightly elevated at 21 with a normal anion gap, glucose 193, platelets were low at 118,000.  He was also positive for influenza A.  Chest x-ray was unremarkable except for low inspiratory effort.  Blood cultures have been obtained due to concerns over patient may having sepsis either due to influenza physiology or to possible UTI noting patient was retaining urine.  Bladder scan revealed 500 cc.  EDP unable to advance the catheter so urology has been consulted to assist with catheter placement.  Hospitalist team has been consulted to evaluate the patient for admission.  Assessment & Plan:   Principal Problem:   Severe sepsis (HCC) Active Problems:   Diabetes mellitus with  diabetic neuropathy, with long-term current use of insulin (HCC)   Alcohol abuse   Elevated LFTs   Tobacco abuse   S/P BKA (below knee amputation) (HCC)   CAD S/P percutaneous coronary angioplasty   Benign prostatic hyperplasia with urinary obstruction  Severe sepsis and acute hypoxic respiratory failure secondary to influenza A infection, POA: -Met severe sepsis criteria based on fever 102.7, tachypnea, tachycardia and acute hypoxic respiratory failure, source remains to be influenza A.  Morning he still with significant wheezing, he dropped to 86% once weaned off oxygen, currently back on 2 L nasal cannula. -Continue with Tamiflu. -Continue with steroids. -Continue with IV fluids. -Will start on scheduled DuoNebs possible COPD exacerbation provoked by his flu infection.  Acute urinary retention/BPH Noted with at least 500 cc on bladder scan, ED unable to place Foley catheter, urology consulted, Foley catheter inserted, he had 1400 cc output.  He was on Flomax PTA.  That has not been resumed.  Per urology, they recommended increasing Flomax to twice daily.  , resumed finasteride as well.  Urology recommended keeping indwelling Foley catheter for at least a week.   Acute thrombocytopenia Likely due to hypoperfusion from sepsis physiology and/or from possible.  Slight drop in platelets but no signs of bleeding.  Monitor daily.  Hopefully this will improve with improvement in sepsis parameters.   Elevated LFTs: Likely due to sepsis.  Treat underlying cause.  Monitor daily.  Slight elevation in AST and ALT  again today.   Diabetes mellitus 2 Appears to be on Jardiance as well as Lantus 35 units twice daily and NovoLog 10 units 3 times daily Premeal.   Hemoglobin A1c on 02/14/2023 was 10.9 Previously had drug-induced pancreatitis from Ozempic.  For now he is not on anything and I will start him on SSI as well as Lantus only 30 units in the morning.   Regular alcohol use Reported on previous  admission that he drinks about 3 daily.  During that hospitalization he had no evidence of withdrawal.  No signs of withdrawal.  Continue CIWA protocol.   HFrEF EF 45 to 50% on echocardiogram from 2023.  Appears euvolemic.  Resume Entresto and metoprolol.   Hypertension/CAD Asymptomatic, resume home medications.  UTI ruled out   Prior right BKA with prosthesis  DVT prophylaxis: enoxaparin (LOVENOX) injection 40 mg Start: 03/17/23 1200   Code Status: Full Code  Family Communication:  None present at bedside.  Plan of care discussed with patient in length and he/she verbalized understanding and agreed with it.  Status is: Inpatient Remains inpatient appropriate because: Hypoxic with wheezing   Estimated body mass index is 33.79 kg/m as calculated from the following:   Height as of this encounter: 5\' 8"  (1.727 m).   Weight as of this encounter: 100.8 kg.    Nutritional Assessment: Body mass index is 33.79 kg/m.Marland Kitchen Seen by dietician.  I agree with the assessment and plan as outlined below: Nutrition Status:        . Skin Assessment: I have examined the patient's skin and I agree with the wound assessment as performed by the wound care RN as outlined below:    Consultants:  None  Procedures:  None  Antimicrobials:  Anti-infectives (From admission, onward)    Start     Dose/Rate Route Frequency Ordered Stop   03/17/23 1215  oseltamivir (TAMIFLU) capsule 30 mg        30 mg Oral 2 times daily 03/17/23 1213 03/22/23 0959         Subjective:  Still reports shortness of breath, cough  Objective: Vitals:   03/19/23 0327 03/19/23 0606 03/19/23 0705 03/19/23 0800  BP: 131/81 121/81  120/86  Pulse: 72 80  78  Resp: 14   16  Temp: 97.9 F (36.6 C)   98 F (36.7 C)  TempSrc: Oral   Oral  SpO2: 92%  91% 94%  Weight:      Height:        Intake/Output Summary (Last 24 hours) at 03/19/2023 1252 Last data filed at 03/19/2023 0830 Gross per 24 hour  Intake 243 ml   Output 3100 ml  Net -2857 ml   Filed Weights   03/17/23 0344  Weight: 100.8 kg    Examination:  Awake Alert, Oriented X 3, No new F.N deficits, Normal affect Symmetrical Chest wall movement, Minister entry with significant wheezing RRR,No Gallops,Rubs or new Murmurs, No Parasternal Heave +ve B.Sounds, Abd Soft, No tenderness, No rebound - guarding or rigidity. No Cyanosis, Clubbing or edema, right BKA    Data Reviewed: I have personally reviewed following labs and imaging studies  CBC: Recent Labs  Lab 03/17/23 0350 03/17/23 1201 03/19/23 0012  WBC 5.4 5.1 7.5  NEUTROABS 4.6  --   --   HGB 16.0 14.4 14.7  HCT 47.6 43.8 43.3  MCV 92.6 93.8 90.2  PLT 118* 109* 114*   Basic Metabolic Panel: Recent Labs  Lab 03/17/23 0350 03/17/23 1201 03/18/23 0517  NA 135  --  135  K 4.3  --  3.9  CL 103  --  107  CO2 21*  --  18*  GLUCOSE 193*  --  112*  BUN 10  --  13  CREATININE 1.14 0.94 0.76  CALCIUM 9.0  --  8.6*  MG  --   --  2.0   GFR: Estimated Creatinine Clearance: 108.8 mL/min (by C-G formula based on SCr of 0.76 mg/dL). Liver Function Tests: Recent Labs  Lab 03/17/23 0350 03/18/23 0517  AST 53* 127*  ALT 46* 97*  ALKPHOS 84 69  BILITOT 0.8 0.7  PROT 7.4 6.7  ALBUMIN 3.6 3.0*   No results for input(s): "LIPASE", "AMYLASE" in the last 168 hours. No results for input(s): "AMMONIA" in the last 168 hours. Coagulation Profile: Recent Labs  Lab 03/17/23 0350  INR 1.1   Cardiac Enzymes: Recent Labs  Lab 03/17/23 0355  CKTOTAL 53   BNP (last 3 results) No results for input(s): "PROBNP" in the last 8760 hours. HbA1C: No results for input(s): "HGBA1C" in the last 72 hours. CBG: Recent Labs  Lab 03/18/23 1138 03/18/23 1838 03/18/23 2214 03/19/23 0804 03/19/23 1201  GLUCAP 128* 219* 237* 145* 209*   Lipid Profile: No results for input(s): "CHOL", "HDL", "LDLCALC", "TRIG", "CHOLHDL", "LDLDIRECT" in the last 72 hours. Thyroid Function  Tests: No results for input(s): "TSH", "T4TOTAL", "FREET4", "T3FREE", "THYROIDAB" in the last 72 hours. Anemia Panel: No results for input(s): "VITAMINB12", "FOLATE", "FERRITIN", "TIBC", "IRON", "RETICCTPCT" in the last 72 hours. Sepsis Labs: Recent Labs  Lab 03/17/23 0427 03/17/23 1309 03/17/23 1748 03/18/23 0517  PROCALCITON  --  <0.10  --  <0.10  LATICACIDVEN 1.4 1.3 1.2  --     Recent Results (from the past 240 hours)  Blood Culture (routine x 2)     Status: None (Preliminary result)   Collection Time: 03/17/23  3:50 AM   Specimen: BLOOD  Result Value Ref Range Status   Specimen Description BLOOD LEFT ANTECUBITAL  Final   Special Requests   Final    BOTTLES DRAWN AEROBIC AND ANAEROBIC Blood Culture results may not be optimal due to an inadequate volume of blood received in culture bottles   Culture   Final    NO GROWTH 2 DAYS Performed at [facility], [address], [place], [place] [address]    Report Status PENDING  Incomplete  Resp panel by RT-PCR (RSV, Flu A&B, Covid) Anterior Nasal Swab     Status: Abnormal   Collection Time: 03/17/23  3:51 AM   Specimen: Anterior Nasal Swab  Result Value Ref Range Status   SARS Coronavirus 2 by RT PCR NEGATIVE NEGATIVE Final   Influenza A by PCR POSITIVE (A) NEGATIVE Final   Influenza B by PCR NEGATIVE NEGATIVE Final    Comment: (NOTE) The Xpert Xpress SARS-CoV-2/FLU/RSV plus assay is intended as an aid in the diagnosis of influenza from Nasopharyngeal swab specimens and should not be used as a sole basis for treatment. Nasal washings and aspirates are unacceptable for Xpert Xpress SARS-CoV-2/FLU/RSV testing.  Fact Sheet for Patients: BloggerCourse.com  Fact Sheet for Healthcare Providers: SeriousBroker.it  This test is not yet approved or cleared by the [place] FDA and has been authorized for detection and/or diagnosis of SARS-CoV-2 by FDA under an Emergency  Use Authorization (EUA). This EUA will remain in effect (meaning this test can be used) for the duration of the COVID-19 declaration under Section 564(b)(1) of the Act,  21 U.S.C. section 360bbb-3(b)(1), unless the authorization is terminated or revoked.     Resp Syncytial Virus by PCR NEGATIVE NEGATIVE Final    Comment: (NOTE) Fact Sheet for Patients: BloggerCourse.com  Fact Sheet for Healthcare Providers: SeriousBroker.it  This test is not yet approved or cleared by the [place] FDA and has been authorized for detection and/or diagnosis of SARS-CoV-2 by FDA under an Emergency Use Authorization (EUA). This EUA will remain in effect (meaning this test can be used) for the duration of the COVID-19 declaration under Section 564(b)(1) of the Act, 21 U.S.C. section 360bbb-3(b)(1), unless the authorization is terminated or revoked.  Performed at [facility], [address] [place], [place] [address]   Blood Culture (routine x 2)     Status: None (Preliminary result)   Collection Time: 03/17/23  3:55 AM   Specimen: BLOOD  Result Value Ref Range Status   Specimen Description BLOOD RIGHT ANTECUBITAL  Final   Special Requests   Final    BOTTLES DRAWN AEROBIC AND ANAEROBIC Blood Culture results may not be optimal due to an inadequate volume of blood received in culture bottles   Culture   Final    NO GROWTH 2 DAYS Performed at [facility], [address], [place], [place] [address]    Report Status PENDING  Incomplete  Urine Culture (for pregnant, neutropenic or urologic patients or patients with an indwelling urinary catheter)     Status: None   Collection Time: 03/17/23 11:21 AM   Specimen: Urine, Catheterized  Result Value Ref Range Status   Specimen Description URINE, CATHETERIZED  Final   Special Requests NONE  Final   Culture   Final    NO GROWTH Performed at [facility], [address],  [place], [place] [address]    Report Status 03/18/2023 FINAL  Final     Radiology Studies: No results found.   Scheduled Meds:  aspirin EC  81 mg Oral q AM   atorvastatin  80 mg Oral QHS   Chlorhexidine Gluconate Cloth  6 each Topical Daily   clopidogrel  75 mg Oral q AM   enoxaparin (LOVENOX) injection  40 mg Subcutaneous Q24H   finasteride  5 mg Oral q AM   folic acid  1 mg Oral Daily   furosemide  40 mg Oral q AM   gabapentin  300 mg Oral TID   insulin aspart  0-15 Units Subcutaneous TID WC   insulin aspart  0-5 Units Subcutaneous QHS   insulin glargine-yfgn  30 Units Subcutaneous Daily   ipratropium-albuterol  3 mL Nebulization QID   methylPREDNISolone (SOLU-MEDROL) injection  40 mg Intravenous Daily   metoprolol tartrate  50 mg Oral q AM   oseltamivir  30 mg Oral BID   sacubitril-valsartan  1 tablet Oral BID   sertraline  150 mg Oral Daily   sodium chloride flush  3 mL Intravenous Q12H   tamsulosin  0.4 mg Oral BID   thiamine  100 mg Oral Daily   Continuous Infusions:     LOS: 2 days   Huey Bienenstock, MD [facility]  03/19/2023, 12:52 PM   *Please note that this is a verbal dictation therefore any spelling or grammatical errors are due to the "Dragon Medical One" system interpretation.  Please page via Amion and do not message via secure chat for urgent patient care matters. Secure chat can be used for non urgent patient care matters.  How to contact the  [facility] Attending or Consulting provider 7A - 7P or covering provider during after hours 7P -7A, for this patient?  Check the care team in [facility] and look for a) attending/consulting [facility] provider listed and b) the [facility] team listed. Page or secure chat 7A-7P. Log into [email or web address] and use [place]'s universal password to access. If you do not have the password, please contact the hospital operator. Locate the [facility] provider you are looking for under [facility] and page to a number that you can be directly  reached. If you still have difficulty reaching the provider, please page the [facility] (Director on Call) for the Hospitalists listed on amion for assistance.

## 2023-03-19 NOTE — Progress Notes (Signed)
PHARMACY NOTE:  ANTIMICROBIAL RENAL DOSAGE ADJUSTMENT  Current antimicrobial regimen includes a mismatch between antimicrobial dosage and estimated renal function.  As per policy approved by the Pharmacy & Therapeutics and Medical Executive Committees, the antimicrobial dosage will be adjusted accordingly.  Current antimicrobial dosage:  Tamiflu 30 mg BID  Indication: Influenza A  Renal Function:  Estimated Creatinine Clearance: 108.8 mL/min (by C-G formula based on SCr of 0.76 mg/dL). []      On intermittent HD, scheduled: []      On CRRT    Antimicrobial dosage has been changed to:  Tamiflu 75 mg BID to complete course.  Additional comments:   Thank you for allowing pharmacy to be a part of this patient's care.  Dennie Fetters, [facility] 03/19/2023 9:52 PM

## 2023-03-20 DIAGNOSIS — I2510 Atherosclerotic heart disease of native coronary artery without angina pectoris: Secondary | ICD-10-CM | POA: Diagnosis not present

## 2023-03-20 DIAGNOSIS — A419 Sepsis, unspecified organism: Secondary | ICD-10-CM

## 2023-03-20 DIAGNOSIS — E1140 Type 2 diabetes mellitus with diabetic neuropathy, unspecified: Secondary | ICD-10-CM | POA: Diagnosis not present

## 2023-03-20 DIAGNOSIS — Z9861 Coronary angioplasty status: Secondary | ICD-10-CM

## 2023-03-20 DIAGNOSIS — N401 Enlarged prostate with lower urinary tract symptoms: Secondary | ICD-10-CM

## 2023-03-20 DIAGNOSIS — R6520 Severe sepsis without septic shock: Secondary | ICD-10-CM

## 2023-03-20 DIAGNOSIS — Z794 Long term (current) use of insulin: Secondary | ICD-10-CM

## 2023-03-20 DIAGNOSIS — N138 Other obstructive and reflux uropathy: Secondary | ICD-10-CM

## 2023-03-20 LAB — CBC
HCT: 43.8 % (ref 39.0–52.0)
Hemoglobin: 14.6 g/dL (ref 13.0–17.0)
MCH: 30.4 pg (ref 26.0–34.0)
MCHC: 33.3 g/dL (ref 30.0–36.0)
MCV: 91.3 fL (ref 80.0–100.0)
Platelets: 126 10*3/uL — ABNORMAL LOW (ref 150–400)
RBC: 4.8 MIL/uL (ref 4.22–5.81)
RDW: 16 % — ABNORMAL HIGH (ref 11.5–15.5)
WBC: 5.2 10*3/uL (ref 4.0–10.5)
nRBC: 0 % (ref 0.0–0.2)

## 2023-03-20 LAB — GLUCOSE, CAPILLARY
Glucose-Capillary: 156 mg/dL — ABNORMAL HIGH (ref 70–99)
Glucose-Capillary: 251 mg/dL — ABNORMAL HIGH (ref 70–99)
Glucose-Capillary: 293 mg/dL — ABNORMAL HIGH (ref 70–99)
Glucose-Capillary: 175 mg/dL — ABNORMAL HIGH (ref 70–99)

## 2023-03-20 LAB — BASIC METABOLIC PANEL
Anion gap: 7 (ref 5–15)
BUN: 12 mg/dL (ref 8–23)
CO2: 24 mmol/L (ref 22–32)
Calcium: 8.5 mg/dL — ABNORMAL LOW (ref 8.9–10.3)
Chloride: 109 mmol/L (ref 98–111)
Creatinine, Ser: 0.62 mg/dL (ref 0.61–1.24)
GFR, Estimated: >60 mL/min (ref >60)
Glucose, Bld: 164 mg/dL — ABNORMAL HIGH (ref 70–99)
Potassium: 3.4 mmol/L — ABNORMAL LOW (ref 3.5–5.1)
Sodium: 140 mmol/L (ref 135–145)

## 2023-03-20 MED ORDER — PREDNISONE 20 MG PO TABS
40.0000 mg | ORAL_TABLET | Freq: Every day | ORAL | Status: DC
  Administered 2023-03-21: 40 mg via ORAL
  Filled 2023-03-20: qty 2

## 2023-03-20 MED ORDER — IPRATROPIUM-ALBUTEROL 0.5-2.5 (3) MG/3ML IN SOLN: 3.0000 mL | RESPIRATORY_TRACT | Status: DC | PRN

## 2023-03-20 MED ORDER — POTASSIUM CHLORIDE CRYS ER 20 MEQ PO TBCR
40.0000 meq | EXTENDED_RELEASE_TABLET | Freq: Once | ORAL | Status: AC
  Administered 2023-03-20: 40 meq via ORAL
  Filled 2023-03-20: qty 2

## 2023-03-20 NOTE — Progress Notes (Signed)
PROGRESS NOTE        PATIENT DETAILS Name: Ricardo Howard Age: 63 y.o. Sex: male Date of Birth: Feb 23, 1960 Admit Date: 03/17/2023 Admitting Physician Glade Lloyd, MD WUJ:[id number], Lenn Sink  Brief Summary: Patient is a 63 y.o.  male DM-2, HTN, HFrEF, CAD, Ozempic induced pancreatitis, PAD-s/p right BKA, chronic hypoxic respiratory failure on 3 L of oxygen at home-who presented with sepsis/COPD exacerbation due to influenza A infection.    Significant events: 1/26>> admit to [facility]-influenza A/COPD exacerbation/sepsis-acute urine retention requiring coud catheter insertion by urology.  Significant studies: 1/26>> CXR: No obvious pneumonia-no CHF.  Significant microbiology data: 1/26>> COVID/RSV PCR: Negative 1/26>> influenza A PCR: Positive 1/26>> blood culture: No growth 1/28>> respiratory virus panel: Influenza A H3: Positive  Procedures: None  Consults: Urology  Subjective: Although improved-still coughing-does not feel he is back to his baseline.  Objective: Vitals: Blood pressure (!) 111/99, pulse 85, temperature 98.1 F (36.7 C), temperature source Oral, resp. rate (!) 22, height 5\' 8"  (1.727 m), weight 100.8 kg, SpO2 90%.   Exam: Gen Exam:Alert awake-not in any distress HEENT:atraumatic, normocephalic Chest: Moving air-still with bilateral rhonchi. CVS:S1S2 regular Abdomen:soft non tender, non distended Extremities:no edema Neurology: Non focal Skin: no rash  Pertinent Labs/Radiology:    Latest Ref Rng & Units 03/20/2023    4:28 AM 03/19/2023   12:12 AM 03/17/2023   12:01 PM  CBC  WBC 4.0 - 10.5 K/uL 5.2  7.5  5.1   Hemoglobin 13.0 - 17.0 g/dL 78.2  95.6  21.3   Hematocrit 39.0 - 52.0 % 43.8  43.3  43.8   Platelets 150 - 400 K/uL 126  114  109     Lab Results  Component Value Date   NA 140 03/20/2023   K 3.4 (L) 03/20/2023   CL 109 03/20/2023   CO2 24 03/20/2023      Assessment/Plan: Severe sepsis secondary to  influenza A infection Sepsis physiology has resolved Continue Tamiflu.  Acute on chronic hypoxic respiratory failure due to COPD exacerbation secondary to influenza A infection Slowly improving-still wheezing-on 3 L of oxygen-which is at baseline Continue bronchodilators/steroids Encourage incentive spirometry/flutter valve Mobilize with PT/OT/nursing staff.  Acute urinary retention-likely secondary to BPH Difficult Foley catheter placement-urology inserted coud catheter Continue Flomax Plan is to discharge home with coud catheter-and have patient follow-up with urology for outpatient voiding trial.  Thrombocytopenia Mild Likely due to viral/flu infection-follow.  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) Euvolemic Continue Entresto/metoprolol/metoprolol  CAD-s/p PCI 2023 No anginal symptoms Continue aspirin/Plavix/statin/metoprolol  PAD-s/p right BKA-s/p left SFA/popliteal artery stenting DAPT/statin  HTN BP stable Metoprolol/Entresto   DM-2 (A1c 10.9 on 02/14/2023)-with uncontrolled hyperglycemia due to steroids CBGs slowly improving Semglee 30 units daily+ SSI   Recent Labs    03/19/23 1634 03/19/23 2120 03/20/23 0805  GLUCAP 309* 309* 156*    Peripheral neuropathy Likely due to DM Gabapentin  Class 1 Obesity: Estimated body mass index is 33.79 kg/m as calculated from the following:   Height as of this encounter: 5\' 8"  (1.727 m).   Weight as of this encounter: 100.8 kg.   Code status:   Code Status: Full Code   DVT Prophylaxis: enoxaparin (LOVENOX) injection 40 mg Start: 03/17/23 1200   Family Communication: Spouse on the phone  when I was rounding this morning.   Disposition Plan: Status is: Inpatient Remains inpatient appropriate because: Severity of illness   Planned Discharge Destination:Home health   Diet: Diet Order              Diet heart healthy/carb modified Fluid consistency: Thin  Diet effective now                     Antimicrobial agents: Anti-infectives (From admission, onward)    Start     Dose/Rate Route Frequency Ordered Stop   03/20/23 1000  oseltamivir (TAMIFLU) capsule 75 mg        75 mg Oral 2 times daily 03/19/23 2148 03/22/23 0959   03/17/23 1215  oseltamivir (TAMIFLU) capsule 30 mg  Status:  Discontinued        30 mg Oral 2 times daily 03/17/23 1213 03/19/23 2148        MEDICATIONS: Scheduled Meds:  aspirin EC  81 mg Oral q AM   atorvastatin  80 mg Oral QHS   Chlorhexidine Gluconate Cloth  6 each Topical Daily   clopidogrel  75 mg Oral q AM   enoxaparin (LOVENOX) injection  40 mg Subcutaneous Q24H   finasteride  5 mg Oral q AM   folic acid  1 mg Oral Daily   furosemide  40 mg Oral q AM   gabapentin  300 mg Oral TID   insulin aspart  0-15 Units Subcutaneous TID WC   insulin aspart  0-5 Units Subcutaneous QHS   insulin glargine-yfgn  30 Units Subcutaneous Daily   methylPREDNISolone (SOLU-MEDROL) injection  40 mg Intravenous Daily   metoprolol tartrate  50 mg Oral q AM   oseltamivir  75 mg Oral BID   sacubitril-valsartan  1 tablet Oral BID   sertraline  150 mg Oral Daily   sodium chloride flush  3 mL Intravenous Q12H   tamsulosin  0.4 mg Oral BID   thiamine  100 mg Oral Daily   Continuous Infusions: PRN Meds:.acetaminophen **OR** acetaminophen, guaiFENesin, ipratropium-albuterol, ketorolac, ondansetron **OR** ondansetron (ZOFRAN) IV   I have personally reviewed following labs and imaging studies  LABORATORY DATA: CBC: Recent Labs  Lab 03/17/23 0350 03/17/23 1201 03/19/23 0012 03/20/23 0428  WBC 5.4 5.1 7.5 5.2  NEUTROABS 4.6  --   --   --   HGB 16.0 14.4 14.7 14.6  HCT 47.6 43.8 43.3 43.8  MCV 92.6 93.8 90.2 91.3  PLT 118* 109* 114* 126*    Basic Metabolic Panel: Recent Labs  Lab 03/17/23 0350 03/17/23 1201 03/18/23 0517 03/20/23 0428  NA 135  --   135 140  K 4.3  --  3.9 3.4*  CL 103  --  107 109  CO2 21*  --  18* 24  GLUCOSE 193*  --  112* 164*  BUN 10  --  13 12  CREATININE 1.14 0.94 0.76 0.62  CALCIUM 9.0  --  8.6* 8.5*  MG  --   --  2.0  --     GFR: Estimated Creatinine Clearance: 108.8 mL/min (by C-G formula based on SCr of 0.62 mg/dL).  Liver Function Tests: Recent Labs  Lab 03/17/23 0350 03/18/23 0517  AST 53* 127*  ALT 46* 97*  ALKPHOS 84 69  BILITOT 0.8 0.7  PROT 7.4 6.7  ALBUMIN 3.6 3.0*   No results for input(s): "LIPASE", "AMYLASE" in the last 168 hours. No results for input(s): "AMMONIA" in the last 168 hours.  Coagulation Profile: Recent  Labs  Lab 03/17/23 0350  INR 1.1    Cardiac Enzymes: Recent Labs  Lab 03/17/23 0355  CKTOTAL 53    BNP (last 3 results) No results for input(s): "PROBNP" in the last 8760 hours.  Lipid Profile: No results for input(s): "CHOL", "HDL", "LDLCALC", "TRIG", "CHOLHDL", "LDLDIRECT" in the last 72 hours.  Thyroid Function Tests: No results for input(s): "TSH", "T4TOTAL", "FREET4", "T3FREE", "THYROIDAB" in the last 72 hours.  Anemia Panel: No results for input(s): "VITAMINB12", "FOLATE", "FERRITIN", "TIBC", "IRON", "RETICCTPCT" in the last 72 hours.  Urine analysis:    Component Value Date/Time   COLORURINE YELLOW 03/17/2023 1309   APPEARANCEUR CLEAR 03/17/2023 1309   LABSPEC 1.031 (H) 03/17/2023 1309   PHURINE 5.0 03/17/2023 1309   GLUCOSEU >=500 (A) 03/17/2023 1309   HGBUR NEGATIVE 03/17/2023 1309   BILIRUBINUR NEGATIVE 03/17/2023 1309   KETONESUR NEGATIVE 03/17/2023 1309   PROTEINUR NEGATIVE 03/17/2023 1309   NITRITE NEGATIVE 03/17/2023 1309   LEUKOCYTESUR NEGATIVE 03/17/2023 1309    Sepsis Labs: Lactic Acid, Venous    Component Value Date/Time   LATICACIDVEN 1.2 03/17/2023 1748    MICROBIOLOGY: Recent Results (from the past 240 hours)  Blood Culture (routine x 2)     Status: None (Preliminary result)   Collection Time: 03/17/23  3:50 AM    Specimen: BLOOD  Result Value Ref Range Status   Specimen Description BLOOD LEFT ANTECUBITAL  Final   Special Requests   Final    BOTTLES DRAWN AEROBIC AND ANAEROBIC Blood Culture results may not be optimal due to an inadequate volume of blood received in culture bottles   Culture   Final    NO GROWTH 3 DAYS Performed at [facility], [address] [place], [place] [address]    Report Status PENDING  Incomplete  Resp panel by RT-PCR (RSV, Flu A&B, Covid) Anterior Nasal Swab     Status: Abnormal   Collection Time: 03/17/23  3:51 AM   Specimen: Anterior Nasal Swab  Result Value Ref Range Status   SARS Coronavirus 2 by RT PCR NEGATIVE NEGATIVE Final   Influenza A by PCR POSITIVE (A) NEGATIVE Final   Influenza B by PCR NEGATIVE NEGATIVE Final    Comment: (NOTE) The Xpert Xpress SARS-CoV-2/FLU/RSV plus assay is intended as an aid in the diagnosis of influenza from Nasopharyngeal swab specimens and should not be used as a sole basis for treatment. Nasal washings and aspirates are unacceptable for Xpert Xpress SARS-CoV-2/FLU/RSV testing.  Fact Sheet for Patients: BloggerCourse.com  Fact Sheet for Healthcare Providers: SeriousBroker.it  This test is not yet approved or cleared by the [place] FDA and has been authorized for detection and/or diagnosis of SARS-CoV-2 by FDA under an Emergency Use Authorization (EUA). This EUA will remain in effect (meaning this test can be used) for the duration of the COVID-19 declaration under Section 564(b)(1) of the Act, 21 U.S.C. section 360bbb-3(b)(1), unless the authorization is terminated or revoked.     Resp Syncytial Virus by PCR NEGATIVE NEGATIVE Final    Comment: (NOTE) Fact Sheet for Patients: BloggerCourse.com  Fact Sheet for Healthcare Providers: SeriousBroker.it  This test is not yet approved or cleared by the [place] FDA and has been authorized for detection and/or diagnosis of SARS-CoV-2 by FDA under an Emergency Use Authorization (EUA). This EUA will remain in effect (meaning this test can be used) for the duration of the COVID-19 declaration under Section 564(b)(1) of the Act, 21 U.S.C. section 360bbb-3(b)(1), unless the authorization  is terminated or revoked.  Performed at [facility], [address], [place], [place] [address]   Blood Culture (routine x 2)     Status: None (Preliminary result)   Collection Time: 03/17/23  3:55 AM   Specimen: BLOOD  Result Value Ref Range Status   Specimen Description BLOOD RIGHT ANTECUBITAL  Final   Special Requests   Final    BOTTLES DRAWN AEROBIC AND ANAEROBIC Blood Culture results may not be optimal due to an inadequate volume of blood received in culture bottles   Culture   Final    NO GROWTH 3 DAYS Performed at [facility], [address], [place], [place] [address]    Report Status PENDING  Incomplete  Urine Culture (for pregnant, neutropenic or urologic patients or patients with an indwelling urinary catheter)     Status: None   Collection Time: 03/17/23 11:21 AM   Specimen: Urine, Catheterized  Result Value Ref Range Status   Specimen Description URINE, CATHETERIZED  Final   Special Requests NONE  Final   Culture   Final    NO GROWTH Performed at [facility], [address], [place], [place] [address]    Report Status 03/18/2023 FINAL  Final  Respiratory (~20 pathogens) panel by PCR     Status: Abnormal   Collection Time: 03/19/23 11:12 AM   Specimen: Nasopharyngeal Swab; Respiratory  Result Value Ref Range Status   Adenovirus NOT DETECTED NOT DETECTED Final   Coronavirus 229E NOT DETECTED NOT DETECTED Final    Comment: (NOTE) The Coronavirus on the Respiratory Panel, DOES NOT test for the novel  Coronavirus (2019 nCoV)    Coronavirus HKU1 NOT DETECTED NOT DETECTED Final   Coronavirus NL63 NOT DETECTED NOT  DETECTED Final   Coronavirus OC43 NOT DETECTED NOT DETECTED Final   Metapneumovirus NOT DETECTED NOT DETECTED Final   Rhinovirus / Enterovirus NOT DETECTED NOT DETECTED Final   Influenza A H3 DETECTED (A) NOT DETECTED Final   Influenza B NOT DETECTED NOT DETECTED Final   Parainfluenza Virus 1 NOT DETECTED NOT DETECTED Final   Parainfluenza Virus 2 NOT DETECTED NOT DETECTED Final   Parainfluenza Virus 3 NOT DETECTED NOT DETECTED Final   Parainfluenza Virus 4 NOT DETECTED NOT DETECTED Final   Respiratory Syncytial Virus NOT DETECTED NOT DETECTED Final   Bordetella pertussis NOT DETECTED NOT DETECTED Final   Bordetella Parapertussis NOT DETECTED NOT DETECTED Final   Chlamydophila pneumoniae NOT DETECTED NOT DETECTED Final   Mycoplasma pneumoniae NOT DETECTED NOT DETECTED Final    Comment: Performed at [facility], [address] [place], [place] [address]    RADIOLOGY STUDIES/RESULTS: No results found.   LOS: 3 days   Jeoffrey Massed, MD  [facility]    To contact the attending provider between 7A-7P or the covering provider during after hours 7P-7A, please log into the web site [email or web address] and access using universal [place] password for that web site. If you do not have the password, please call the hospital operator.  03/20/2023, 10:30 AM

## 2023-03-20 NOTE — Evaluation (Signed)
Physical Therapy Evaluation Patient Details Name: Ricardo Howard MRN: [id number] DOB: 1960/05/11 Today's Date: 03/20/2023  History of Present Illness  Pt is a 63 y/o M admitted on 03/17/23 after presenting with c/o AMS, urinary frequency, muscle pain & spasms. Pt tested positive for Influenza A, catheter placed 2/2 urinary retention. Pt being treated for severe sepsis & acute hypoxic respiratory failure. PMH: HTN, DM2, tobacco use, HFrEF, CAD, R BKA, Ozempic induced pancreatitis  Clinical Impression  Pt seen for PT evaluation with pt agreeable. Pt reports prior to admission he was living with his wife in a 1 level home with steps to enter. Pt notes he was ambulatory with RLE prosthetic without AD but notes ~5 falls in the past 6 months. On this date, pt is able to complete bed mobility without assistance, STS & step pivot without AD with CGA. Pt demonstrates decreased standing balance. PT provided pt with RW & educated pt on use of AD & recommendation to use AD at d/c & pt agreeable. Pt able to ambulate in room with RW & supervision with improved balance. Pt would benefit from ongoing PT services to address balance, strengthening, & gait with LRAD to reduce fall risk. Notified team of pt's O2 needs on this date.      If plan is discharge home, recommend the following: A little help with walking and/or transfers;A little help with bathing/dressing/bathroom;Assist for transportation;Help with stairs or ramp for entrance   Can travel by private vehicle        Equipment Recommendations BSC/3in1  Recommendations for Other Services       Functional Status Assessment Patient has had a recent decline in their functional status and demonstrates the ability to make significant improvements in function in a reasonable and predictable amount of time.     Precautions / Restrictions Precautions Precautions: Fall Precaution Comments: RLE prosthesis 2/2 BKA Restrictions Weight Bearing Restrictions Per  Provider Order: No      Mobility  Bed Mobility Overal bed mobility: Needs Assistance Bed Mobility: Supine to Sit     Supine to sit: HOB elevated, Used rails, Supervision          Transfers Overall transfer level: Needs assistance Equipment used: None, Rolling walker (2 wheels) Transfers: Sit to/from Stand, Bed to chair/wheelchair/BSC Sit to Stand: Contact guard assist, Supervision   Step pivot transfers: Contact guard assist (bed>recliner on L without AD)       General transfer comment: STS from EOB without AD with CGA, STS from recliner with RW & supervision with education re: hand placement    Ambulation/Gait Ambulation/Gait assistance: Supervision, Contact guard assist Gait Distance (Feet): 22 Feet Assistive device: Rolling walker (2 wheels) Gait Pattern/deviations: Decreased step length - right, Decreased step length - left, Decreased stride length Gait velocity: decreased     General Gait Details: education re: use of AD  Stairs            Wheelchair Mobility     Tilt Bed    Modified Rankin (Stroke Patients Only)       Balance Overall balance assessment: Needs assistance Sitting-balance support: Feet supported Sitting balance-Leahy Scale: Good Sitting balance - Comments: dons shoes, R prosthetic sitting EOB without LOB   Standing balance support: No upper extremity supported, During functional activity Standing balance-Leahy Scale: Poor                               Pertinent Vitals/Pain Pain  Assessment Pain Assessment: No/denies pain    Home Living Family/patient expects to be discharged to:: Private residence Living Arrangements: Spouse/significant other Available Help at Discharge: Family;Available 24 hours/day Type of Home: House Home Access: Stairs to enter Entrance Stairs-Rails: Right;Can reach both;Left Entrance Stairs-Number of Steps: 6   Home Layout: One level Home Equipment: Pharmacist, hospital (2  wheels);Rollator (4 wheels);Wheelchair - manual;Electric scooter;Grab bars - tub/shower;Crutches;Grab bars - toilet;Hand held shower head Additional Comments: 3L O2 at night    Prior Function Prior Level of Function : History of Falls (last six months)             Mobility Comments: Ambulatory with prosthetic without AD, uses electric carts at stores, primarily household ambulatory. Pt ntoes ~5 falls in the past 6 months all for varying reasons. ADLs Comments: needs assistance getting in/out of the shower, wife drives pt to appointments, enjoys cooking     Extremity/Trunk Assessment   Upper Extremity Assessment Upper Extremity Assessment: Overall WFL for tasks assessed    Lower Extremity Assessment Lower Extremity Assessment: Generalized weakness (RLE BKA with prosthesis in room)       Communication   Communication Communication: No apparent difficulties  Cognition Arousal: Alert Behavior During Therapy: WFL for tasks assessed/performed Overall Cognitive Status: Within Functional Limits for tasks assessed                                          General Comments General comments (skin integrity, edema, etc.): pt on 2.5L/min, increased to 3L/min, SpO2 88% or >, PT educated pt on pursed lip breathing    Exercises     Assessment/Plan    PT Assessment Patient needs continued PT services  PT Problem List Decreased strength;Decreased coordination;Cardiopulmonary status limiting activity;Decreased activity tolerance;Decreased knowledge of use of DME;Decreased balance;Decreased safety awareness;Decreased mobility;Decreased knowledge of precautions       PT Treatment Interventions DME instruction;Balance training;Modalities;Neuromuscular re-education;Stair training;Gait training;Functional mobility training;Therapeutic activities;Therapeutic exercise;Manual techniques;Patient/family education    PT Goals (Current goals can be found in the Care Plan section)   Acute Rehab PT Goals Patient Stated Goal: get better PT Goal Formulation: With patient Time For Goal Achievement: 04/03/23 Potential to Achieve Goals: Good    Frequency Min 1X/week     Co-evaluation PT/OT/SLP Co-Evaluation/Treatment: Yes Reason for Co-Treatment: Other (comment) (unsure of pt's activity tolerance) PT goals addressed during session: Mobility/safety with mobility;Balance;Proper use of DME         AM-PAC PT "6 Clicks" Mobility  Outcome Measure Help needed turning from your back to your side while in a flat bed without using bedrails?: None Help needed moving from lying on your back to sitting on the side of a flat bed without using bedrails?: A Little Help needed moving to and from a bed to a chair (including a wheelchair)?: A Little Help needed standing up from a chair using your arms (e.g., wheelchair or bedside chair)?: A Little Help needed to walk in hospital room?: A Little Help needed climbing 3-5 steps with a railing? : A Little 6 Click Score: 19    End of Session Equipment Utilized During Treatment: Gait belt Activity Tolerance: Patient tolerated treatment well Patient left: in chair;with call bell/phone within reach Nurse Communication: Mobility status PT Visit Diagnosis: Muscle weakness (generalized) (M62.81);History of falling (Z91.81);Other abnormalities of gait and mobility (R26.89);Unsteadiness on feet (R26.81)    Time: 0981-1914 PT Time Calculation (  min) (ACUTE ONLY): 24 min   Charges:   PT Evaluation $PT Eval Moderate Complexity: 1 Mod   PT General Charges $$ ACUTE PT VISIT: 1 Visit         Aleda Grana, PT, DPT 03/20/23, 11:32 AM   Sandi Mariscal 03/20/2023, 11:30 AM

## 2023-03-20 NOTE — Plan of Care (Signed)

## 2023-03-20 NOTE — Plan of Care (Signed)
Problem: Education: Goal: Ability to describe self-care measures that may prevent or decrease complications (Diabetes Survival Skills Education) will improve Outcome: Progressing   Problem: Coping: Goal: Ability to adjust to condition or change in health will improve Outcome: Progressing   Problem: Fluid Volume: Goal: Ability to maintain a balanced intake and output will improve Outcome: Progressing   Problem: Metabolic: Goal: Ability to maintain appropriate glucose levels will improve Outcome: Progressing   Problem: Tissue Perfusion: Goal: Adequacy of tissue perfusion will improve Outcome: Progressing

## 2023-03-20 NOTE — Inpatient Diabetes Management (Addendum)
Inpatient Diabetes Program Recommendations  AACE/ADA: New Consensus Statement on Inpatient Glycemic Control (2015)  Target Ranges:  Prepandial:   less than 140 mg/dL      Peak postprandial:   less than 180 mg/dL (1-2 hours)      Critically ill patients:  140 - 180 mg/dL   Lab Results  Component Value Date   GLUCAP 156 (H) 03/20/2023   HGBA1C 10.9 (H) 02/14/2023    Review of Glycemic Control  Latest Reference Range & Units 03/18/23 22:14 03/19/23 08:04 03/19/23 12:01 03/19/23 16:34 03/19/23 21:20 03/20/23 08:05  Glucose-Capillary 70 - 99 mg/dL 960 (H) 454 (H) 098 (H) 309 (H) 309 (H) 156 (H)  (H): Data is abnormally high Diabetes history: Type 2 DM Outpatient Diabetes medications: JJardiance 12.5 mg every day, Novolog 10 untis TID, Lantus 65 units QD Current orders for Inpatient glycemic control: Semglee 30 units every day, Novolog 0-15 units TID & HS Prednisone 40 mg QD  Inpatient Diabetes Program Recommendations:    In the setting of steroids, consider Novolog 4 units TID (assuming patient consuming >50% of meals)  Spoke with patient regarding outpatient diabetes management. Verified home medications and denies missed doses, however, reports that he has been having multiple episodes of hypoglycemia.  Reviewed patient's current A1c of 10.9%. Explained what a A1c is and what it measures. Also reviewed goal A1c with patient, importance of good glucose control @ home, and blood sugar goals. Reviewed patho of DM, need for improved glycemic control, risk for infection, hypoglycemia, interventions, when to call MD, using CGM, vascular changes, need for insulin titration, and other commorbidities.  Patient currently wearing Dexcom and seeks his care through the [name]. Has an appointment coming up in the next few weeks. Reports atleast 3 times per week having lows in the middle of night.  Denies drinking sugary beverages unless having a low. Reviewed importance of protein and overall CHO  mindfulness.  No additional questions. Secure chat sent to Dr Jerral Ralph.  At discharge: Consider reducing dose of Lantus/Humalog.   Thanks, Lujean Rave, MSN, RNC-OB Diabetes Coordinator [phone] (8a-5p)

## 2023-03-20 NOTE — Evaluation (Signed)
Occupational Therapy Evaluation Patient Details Name: Ricardo Howard MRN: [id number] DOB: September 27, 1960 Today's Date: 03/20/2023   History of Present Illness Pt is a 63 y/o M admitted on 03/17/23 after presenting with c/o AMS, urinary frequency, muscle pain & spasms. Pt tested positive for Influenza A, catheter placed 2/2 urinary retention. Pt being treated for severe sepsis & acute hypoxic respiratory failure. PMH: HTN, DM2, tobacco use, HFrEF, CAD, R BKA, Ozempic induced pancreatitis   Clinical Impression   Prior to this admission, patient living with his wife, and independent in ADLs. Patient endorses 5-6 falls in the last six months, often when he doesnt have his RW present. Currently, patient is requiring 3L of oxygen to maintain sats above 90% and is CGA for bed mobility, transfers and ADLs. OT currently recommending HHOT, but may progress past needing. OT will follow acutely.      If plan is discharge home, recommend the following: A little help with walking and/or transfers;Two people to help with walking and/or transfers;Help with stairs or ramp for entrance;Assist for transportation    Functional Status Assessment  Patient has had a recent decline in their functional status and demonstrates the ability to make significant improvements in function in a reasonable and predictable amount of time.  Equipment Recommendations  None recommended by OT    Recommendations for Other Services       Precautions / Restrictions Precautions Precautions: Fall Precaution Comments: RLE prosthesis 2/2 BKA Restrictions Weight Bearing Restrictions Per Provider Order: No      Mobility Bed Mobility Overal bed mobility: Needs Assistance Bed Mobility: Supine to Sit     Supine to sit: HOB elevated, Used rails, Contact guard          Transfers Overall transfer level: Needs assistance Equipment used: None, Rolling walker (2 wheels) Transfers: Sit to/from Stand, Bed to  chair/wheelchair/BSC Sit to Stand: Contact guard assist, Supervision     Step pivot transfers: Contact guard assist     General transfer comment: STS from EOB without AD with CGA, STS from recliner with RW & supervision with education re: hand placement      Balance Overall balance assessment: Needs assistance Sitting-balance support: Feet supported Sitting balance-Leahy Scale: Good Sitting balance - Comments: dons shoes, R prosthetic sitting EOB without LOB   Standing balance support: No upper extremity supported, During functional activity Standing balance-Leahy Scale: Poor                             ADL either performed or assessed with clinical judgement   ADL Overall ADL's : Needs assistance/impaired Eating/Feeding: Set up;Sitting   Grooming: Set up;Sitting   Upper Body Bathing: Contact guard assist;Sitting   Lower Body Bathing: Contact guard assist;Sitting/lateral leans;Sit to/from stand   Upper Body Dressing : Set up;Sitting   Lower Body Dressing: Contact guard assist;Sitting/lateral leans;Sit to/from stand   Toilet Transfer: Contact guard assist;Ambulation;Rolling walker (2 wheels)   Toileting- Clothing Manipulation and Hygiene: Contact guard assist;Sitting/lateral lean;Sit to/from stand       Functional mobility during ADLs: Contact guard assist;Rolling walker (2 wheels) General ADL Comments: Prior to this admission, patient living with his wife, and independent in ADLs. Patient endorses 5-6 falls in the last six months, often when he doesnt have his RW present. Currently, patient is requiring 3L of oxygen to maintain sats above 90% and is CGA for bed mobility, transfers and ADLs. OT currently recommending HHOT, but may progress past needing.  OT will follow acutely.     Vision Baseline Vision/History: 1 Wears glasses Ability to See in Adequate Light: 0 Adequate Patient Visual Report: No change from baseline Vision Assessment?: No apparent visual  deficits;Wears glasses for reading;Yes Eye Alignment: Within Functional Limits Ocular Range of Motion: Within Functional Limits Alignment/Gaze Preference: Within Defined Limits Tracking/Visual Pursuits: Able to track stimulus in all quads without difficulty Saccades: Within functional limits Convergence: Within functional limits Visual Fields: No apparent deficits     Perception Perception: Not tested       Praxis Praxis: Not tested       Pertinent Vitals/Pain Pain Assessment Pain Assessment: No/denies pain     Extremity/Trunk Assessment Upper Extremity Assessment Upper Extremity Assessment: Overall WFL for tasks assessed   Lower Extremity Assessment Lower Extremity Assessment: Defer to PT evaluation   Cervical / Trunk Assessment Cervical / Trunk Assessment: Normal   Communication Communication Communication: No apparent difficulties   Cognition Arousal: Alert Behavior During Therapy: WFL for tasks assessed/performed Overall Cognitive Status: Within Functional Limits for tasks assessed                                       General Comments  pt on 2.5L/min, increased to 3L/min, SpO2 88% or    Exercises     Shoulder Instructions      Home Living Family/patient expects to be discharged to:: Private residence Living Arrangements: Spouse/significant other Available Help at Discharge: Family;Available 24 hours/day Type of Home: House Home Access: Stairs to enter [employer] of Steps: 6 Entrance Stairs-Rails: Right;Can reach both;Left Home Layout: One level     Bathroom Shower/Tub: Psychologist, counselling (& garden tub)   Firefighter: Standard     Home Equipment: Pharmacist, hospital (2 wheels);Rollator (4 wheels);Wheelchair - manual;Electric scooter;Grab bars - tub/shower;Crutches;Grab bars - toilet;Hand held shower head   Additional Comments: 3L O2 at night      Prior Functioning/Environment Prior Level of Function : History  of Falls (last six months)             Mobility Comments: Ambulatory with prosthetic without AD, uses electric carts at stores, primarily household ambulatory. Pt ntoes ~5 falls in the past 6 months all for varying reasons. ADLs Comments: needs assistance getting in/out of the shower, wife drives pt to appointments, enjoys cooking        OT Problem List: Decreased strength;Decreased range of motion;Decreased activity tolerance;Cardiopulmonary status limiting activity      OT Treatment/Interventions: Therapeutic exercise;Self-care/ADL training;Energy conservation;Patient/family education;Balance training    OT Goals(Current goals can be found in the care plan section) Acute Rehab OT Goals Patient Stated Goal: to get better OT Goal Formulation: With patient Time For Goal Achievement: 04/03/23 Potential to Achieve Goals: Good ADL Goals Pt Will Perform Lower Body Bathing: with modified independence;sitting/lateral leans;sit to/from stand Pt Will Perform Lower Body Dressing: with modified independence;sit to/from stand;sitting/lateral leans Pt Will Transfer to Toilet: with modified independence;ambulating;regular height toilet;grab bars Pt Will Perform Toileting - Clothing Manipulation and hygiene: with modified independence;sitting/lateral leans;sit to/from stand Additional ADL Goal #1: Patient will be able to complete functional task in standing for 3-5 minutes prior to needing seated rest break to increase activity tolerance. Additional ADL Goal #2: Patient will be able to recall 3 strategies for energy conservation once provided handout.  OT Frequency: Min 1X/week    Co-evaluation   Reason for Co-Treatment: Other (comment) (unsure  of pt's activity tolerance) PT goals addressed during session: Mobility/safety with mobility;Balance;Proper use of DME        AM-PAC OT "6 Clicks" Daily Activity     Outcome Measure Help from another person eating meals?: A Little Help from another  person taking care of personal grooming?: A Little Help from another person toileting, which includes using toliet, bedpan, or urinal?: A Little Help from another person bathing (including washing, rinsing, drying)?: A Little Help from another person to put on and taking off regular upper body clothing?: A Little Help from another person to put on and taking off regular lower body clothing?: A Little 6 Click Score: 18   End of Session Equipment Utilized During Treatment: Rolling walker (2 wheels) Nurse Communication: Mobility status  Activity Tolerance: Patient tolerated treatment well Patient left: in chair;with call bell/phone within reach  OT Visit Diagnosis: Unsteadiness on feet (R26.81);Other abnormalities of gait and mobility (R26.89);Muscle weakness (generalized) (M62.81)                Time: 6269-4854 OT Time Calculation (min): 24 min Charges:  OT General Charges $OT Visit: 1 Visit OT Evaluation $OT Eval Moderate Complexity: 1 Mod  Pollyann Glen E. Kensey Luepke, OTR/L Acute Rehabilitation Services [phone]   Cherlyn Cushing 03/20/2023, 3:14 PM

## 2023-03-20 NOTE — TOC CM/SW Note (Signed)
Transition of Care [facility]) - Inpatient Brief Assessment   Patient Details  Name: Roddy Bellamy MRN: [id number] Date of Birth: 1960-05-04  Transition of Care [facility]) CM/SW Contact:    Mearl Latin, LCSW Phone Number: 03/20/2023, 5:56 PM   Clinical Narrative: Patient admitted from home with spouse and was independent. TOC following for Home Health set up.    Transition of Care Asessment: Insurance and Status: Insurance coverage has been reviewed Patient has primary care physician: Yes Home environment has been reviewed: From home Prior level of function:: Independent Prior/Current Home Services: No current home services Social Drivers of Health Review: SDOH reviewed no interventions necessary Readmission risk has been reviewed: Yes Transition of care needs: transition of care needs identified, TOC will continue to follow

## 2023-03-20 NOTE — Progress Notes (Signed)
Mr. Ricardo Howard primary diagnosis of sepsis related to influenza infection is improving well with likelihood of the patient being discharged within the next couple of days. Patient reported some difficulty breathing in the beginning of the day but as the day progressed pt was able to tolerate activity by get out of bed and into the bedside chair. Patient's mood and affect is in high spirits. His blood sugar did run high throughout the day, with the midday reading reaching well into the 200s but semglee was given in the morning and NovoLog intermittently throughout the day.  Recommendations: Continue to monitor blood glucose levels closely and adjust insulin as needed. Encourage ambulation and activities to promote further recovery. Continue to assess respiratory status and provide supportive care. Anticipate discharge within the next couple of days if the patient maintains this progress. Reinforce education regarding home care, blood sugar management, and signs of infection for follow-up.

## 2023-03-21 DIAGNOSIS — E1140 Type 2 diabetes mellitus with diabetic neuropathy, unspecified: Secondary | ICD-10-CM | POA: Diagnosis not present

## 2023-03-21 DIAGNOSIS — F1010 Alcohol abuse, uncomplicated: Secondary | ICD-10-CM | POA: Diagnosis not present

## 2023-03-21 DIAGNOSIS — N401 Enlarged prostate with lower urinary tract symptoms: Secondary | ICD-10-CM | POA: Diagnosis not present

## 2023-03-21 DIAGNOSIS — A419 Sepsis, unspecified organism: Secondary | ICD-10-CM | POA: Diagnosis not present

## 2023-03-21 LAB — BASIC METABOLIC PANEL
Anion gap: 8 (ref 5–15)
BUN: 11 mg/dL (ref 8–23)
CO2: 25 mmol/L (ref 22–32)
Calcium: 8.6 mg/dL — ABNORMAL LOW (ref 8.9–10.3)
Chloride: 105 mmol/L (ref 98–111)
Creatinine, Ser: 0.69 mg/dL (ref 0.61–1.24)
GFR, Estimated: >60 mL/min (ref >60)
Glucose, Bld: 108 mg/dL — ABNORMAL HIGH (ref 70–99)
Potassium: 3.8 mmol/L (ref 3.5–5.1)
Sodium: 138 mmol/L (ref 135–145)

## 2023-03-21 LAB — MAGNESIUM: Magnesium: 1.7 mg/dL (ref 1.7–2.4)

## 2023-03-21 LAB — GLUCOSE, CAPILLARY: Glucose-Capillary: 93 mg/dL (ref 70–99)

## 2023-03-21 MED ORDER — BUDESONIDE-FORMOTEROL FUMARATE 160-4.5 MCG/ACT IN AERO: 2.0000 | INHALATION_SPRAY | Freq: Two times a day (BID) | RESPIRATORY_TRACT | 1 refills | Status: AC

## 2023-03-21 MED ORDER — PREDNISONE 10 MG PO TABS: ORAL_TABLET | ORAL | 0 refills | Status: AC

## 2023-03-21 MED ORDER — ALBUTEROL SULFATE HFA 108 (90 BASE) MCG/ACT IN AERS: 2.0000 | INHALATION_SPRAY | Freq: Four times a day (QID) | RESPIRATORY_TRACT | 1 refills | Status: AC | PRN

## 2023-03-21 MED ORDER — OSELTAMIVIR PHOSPHATE 75 MG PO CAPS: 75.0000 mg | ORAL_CAPSULE | Freq: Two times a day (BID) | ORAL | 0 refills | Status: AC

## 2023-03-21 NOTE — Discharge Summary (Addendum)
PATIENT DETAILS Name: Ricardo Howard Age: 63 y.o. Sex: male Date of Birth: [birth year] MRN: [id number]. Admitting Physician: Glade Lloyd, MD WUJ:[id number], Lenn Sink  Admit Date: 03/17/2023 Discharge date: 03/21/2023  Recommendations for Outpatient Follow-up:  Follow up with PCP in 1-2 weeks Please obtain CMP/CBC in one week Ensure follow-up with [facility]-their office will call him with appointment Statin on hold until LFTs can be rechecked in 1 week.  Admitted From:  Home  Disposition: Home health   Discharge Condition: good  CODE STATUS:   Code Status: Full Code   Diet recommendation:  Diet Order             Diet - low sodium heart healthy           Diet heart healthy/carb modified Fluid consistency: Thin  Diet effective now                    Brief Summary: Patient is a 63 y.o.  male DM-2, HTN, HFrEF, CAD, Ozempic induced pancreatitis, PAD-s/p right BKA, chronic hypoxic respiratory failure on 3 L of oxygen at home-who presented with sepsis/COPD exacerbation due to influenza A infection.    Significant events: 1/26>> admit to [facility]-influenza A/COPD exacerbation/sepsis-acute urine retention requiring coud catheter insertion by urology.   Significant studies: 1/26>> CXR: No obvious pneumonia-no CHF.   Significant microbiology data: 1/26>> COVID/RSV PCR: Negative 1/26>> influenza A PCR: Positive 1/26>> blood culture: No growth 1/28>> respiratory virus panel: Influenza A H3: Positive   Procedures: None   Consults: Urology  Brief Hospital Course: Severe sepsis secondary to influenza A infection Sepsis physiology has resolved Continue Tamiflu x 5 days total   Acute on chronic hypoxic respiratory failure due to COPD exacerbation secondary to influenza A infection Much better-stable on 3 L of oxygen which is his baseline Continue bronchodilators-steroids will be tapered off in the outpatient setting Although he is on 3 L of oxygen at  home-he mostly wears it at night-only at times during the daytime.  He has been advised to use it 24/7 like he was prescribed   Acute urinary retention-likely secondary to BPH Difficult Foley catheter placement-urology inserted coud catheter Continue Flomax Plan is to discharge home with coud catheter-and have patient follow-up with urology for outpatient voiding trial. [facility] called on 1/30-their office will call him with a follow-up appointment.   Thrombocytopenia Mild Likely due to viral/flu infection-follow.   Transaminitis Likely secondary to viral illness Mild Please Pete LFTs in 1 week at PCPs office.   Hypokalemia Repleted.   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   PAD-s/p right BKA-s/p left SFA/popliteal artery stenting DAPT/statin   HTN BP stable Metoprolol/Entresto        DM-2 (A1c 10.9 on 02/14/2023)-with uncontrolled hyperglycemia due to steroids CBG stable-resume usual home regimen on discharge.  Peripheral neuropathy Likely due to DM Gabapentin   Class 1 Obesity: Estimated body mass index is 33.79 kg/m as calculated from the following:   Height as of this encounter: 5\' 8"  (1.727 m).   Weight as of this encounter: 100.8 kg.   Discharge Diagnoses:  Principal Problem:   Severe sepsis (HCC) Active Problems:   Diabetes mellitus with diabetic neuropathy, with long-term current use of insulin (HCC)   Alcohol abuse   Elevated LFTs   Tobacco abuse   S/P BKA (below knee  amputation) (HCC)   CAD S/P percutaneous coronary angioplasty   Benign prostatic hyperplasia with urinary obstruction   Discharge Instructions:  Activity:  As tolerated with Full fall precautions use walker/cane & assistance as needed   Discharge Instructions     Diet - low  sodium heart healthy   Complete by: As directed    Discharge instructions   Complete by: As directed    Follow with Primary MD  Clinic, [facility] in 1-2 weeks  Please get a complete blood count and chemistry panel checked by your Primary MD at your next visit, and again as instructed by your Primary MD.  Please ask your primary care practitioner to recheck your liver function enzymes-and resume your cholesterol medications accordingly.  Get Medicines reviewed and adjusted: Please take all your medications with you for your next visit with your Primary MD  Laboratory/radiological data: Please request your Primary MD to go over all hospital tests and procedure/radiological results at the follow up, please ask your Primary MD to get all Hospital records sent to his/her office.  In some cases, they will be blood work, cultures and biopsy results pending at the time of your discharge. Please request that your primary care M.D. follows up on these results.  Also Note the following: If you experience worsening of your admission symptoms, develop shortness of breath, life threatening emergency, suicidal or homicidal thoughts you must seek medical attention immediately by calling 911 or calling your MD immediately  if symptoms less severe.  You must read complete instructions/literature along with all the possible adverse reactions/side effects for all the Medicines you take and that have been prescribed to you. Take any new Medicines after you have completely understood and accpet all the possible adverse reactions/side effects.   Do not drive when taking Pain medications or sleeping medications (Benzodaizepines)  Do not take more than prescribed Pain, Sleep and Anxiety Medications. It is not advisable to combine anxiety,sleep and pain medications without talking with your primary care practitioner  Special Instructions: If you have smoked or chewed Tobacco  in the last 2 yrs please stop  smoking, stop any regular Alcohol  and or any Recreational drug use.  Wear Seat belts while driving.  Please note: You were cared for by a hospitalist during your hospital stay. Once you are discharged, your primary care physician will handle any further medical issues. Please note that NO REFILLS for any discharge medications will be authorized once you are discharged, as it is imperative that you return to your primary care physician (or establish a relationship with a primary care physician if you do not have one) for your post hospital discharge needs so that they can reassess your need for medications and monitor your lab values.   Increase activity slowly   Complete by: As directed       Allergies as of 03/21/2023       Reactions   Iohexol Hives   Patient broke out in hives after injection of Omni 300, will need 13 hour pre-med in future   Ozempic (0.25 Or 0.5 Mg-dose) [semaglutide(0.25 Or 0.5mg -dos)] Other (See Comments)   pancreatitis   Shellfish Allergy Hives   Milk (cow) Diarrhea   Finding of gastrointestinal tract gas   Zestril [lisinopril] Cough   Zocor [simvastatin] Itching, Cough        Medication List     PAUSE taking these medications    atorvastatin 80 MG tablet Wait to take this until your doctor or other  care provider tells you to start again. Commonly known as: LIPITOR Take 1 tablet (80 mg total) by mouth at bedtime.       TAKE these medications    acetaminophen 325 MG tablet Commonly known as: TYLENOL Take 2 tablets (650 mg total) by mouth every 6 (six) hours as needed for mild pain (or Fever >/= 101).   albuterol 108 (90 Base) MCG/ACT inhaler Commonly known as: VENTOLIN HFA Inhale 2 puffs into the lungs every 6 (six) hours as needed for wheezing or shortness of breath.   aspirin EC 81 MG tablet Take 81 mg by mouth in the morning.   budesonide-formoterol 160-4.5 MCG/ACT inhaler Commonly known as: Symbicort Inhale 2 puffs into the lungs 2  (two) times daily.   buPROPion 150 MG 12 hr tablet Commonly known as: WELLBUTRIN SR Take 150 mg by mouth 2 (two) times daily.   clopidogrel 75 MG tablet Commonly known as: PLAVIX Take 75 mg by mouth in the morning.   empagliflozin 25 MG Tabs tablet Commonly known as: JARDIANCE Take 1 tablet (25 mg total) by mouth daily. What changed: how much to take   Entresto 24-26 MG Generic drug: sacubitril-valsartan Take 1 tablet by mouth 2 (two) times daily.   finasteride 5 MG tablet Commonly known as: PROSCAR Take 5 mg by mouth in the morning.   folic acid 1 MG tablet Commonly known as: FOLVITE Take 1 tablet (1 mg total) by mouth daily. What changed: when to take this   furosemide 40 MG tablet Commonly known as: LASIX Take 1 tablet (40 mg total) by mouth daily. What changed:  how much to take how to take this when to take this   gabapentin 300 MG capsule Commonly known as: NEURONTIN Take 300 mg by mouth 3 (three) times daily. Take 1 capsule (300 mg) by mouth in the morning, take 1 capsule (300 mg) by mouth at lunch, & take 2 capsules (600 mg) by mouth at bedtime   guaiFENesin 600 MG 12 hr tablet Commonly known as: MUCINEX Take 1 tablet (600 mg total) by mouth 2 (two) times daily.   insulin aspart 100 UNIT/ML injection Commonly known as: novoLOG Inject 10 Units into the skin 3 (three) times daily with meals.   insulin glargine 100 UNIT/ML Solostar Pen Commonly known as: LANTUS 30units in am and 35unit at night  Further adjustment per your pcp and endocrinology Goal of a1c is less than 7%, currently your a1c is 10%, not at goal What changed:  how much to take how to take this when to take this additional instructions   Insulin Pen Needle 30G X 8 MM Misc Commonly known as: NOVOFINE Inject 10 each into the skin as needed.   metoprolol tartrate 100 MG tablet Commonly known as: LOPRESSOR Take 50 mg by mouth in the morning.   naproxen sodium 220 MG tablet Commonly  known as: ALEVE Take 220 mg by mouth daily as needed (headache).   nitroGLYCERIN 0.4 MG SL tablet Commonly known as: NITROSTAT Place 1 tablet (0.4 mg total) under the tongue every 5 (five) minutes as needed for chest pain.   oseltamivir 75 MG capsule Commonly known as: TAMIFLU Take 1 capsule (75 mg total) by mouth 2 (two) times daily for 1 dose.   potassium chloride SA 20 MEQ tablet Commonly known as: KLOR-CON M Take 1 tablet (20 mEq total) by mouth daily. What changed: when to take this   predniSONE 10 MG tablet Commonly known as: DELTASONE Take 40  mg daily for 1 day, 30 mg daily for 1 day, 20 mg daily for 1 days,10 mg daily for 1 day, then stop   Quintabs Tabs Take 1 tablet by mouth daily.   ranolazine 500 MG 12 hr tablet Commonly known as: RANEXA Take 500 mg by mouth 2 (two) times daily.   sertraline 100 MG tablet Commonly known as: ZOLOFT Take 150 mg by mouth daily.   tamsulosin 0.4 MG Caps capsule Commonly known as: FLOMAX Take 0.4 mg by mouth daily.   thiamine 100 MG tablet Commonly known as: Vitamin B-1 Take 1 tablet (100 mg total) by mouth daily.        Follow-up Information     Clinic, [facility] Schedule an appointment as soon as possible for a visit in 1 week(s).   Contact information: [address] [facility] [place] [address] [phone]         [facility] Follow up.   Why: Hospital follow up, Office will call with date/time, If you dont hear from them,please give them a call Contact information: [address] [place] [address] [phone]               Allergies  Allergen Reactions   Iohexol Hives    Patient broke out in hives after injection of Omni 300, will need 13 hour pre-med in future   Ozempic (0.25 Or 0.5 Mg-Dose) [Semaglutide(0.25 Or 0.5mg -Dos)] Other (See Comments)    pancreatitis   Shellfish Allergy Hives   Milk (Cow) Diarrhea    Finding of gastrointestinal  tract gas   Zestril [Lisinopril] Cough   Zocor [Simvastatin] Itching and Cough     Other Procedures/Studies: DG Chest Port 1 View Result Date: 03/17/2023 CLINICAL DATA:  Questionable sepsis. EXAM: PORTABLE CHEST 1 VIEW COMPARISON:  AP Lat 02/13/2023 FINDINGS: There is a tangle of multiple overlying monitor wires. The heart is slightly enlarged without evidence of CHF. There is a low inspiration. Bronchovascular crowding is seen in the bases but no convincing focal pneumonia. The mediastinum is normally outlined. No pleural effusion is seen. Thoracic spondylosis. IMPRESSION: 1. Low inspiration with bronchovascular crowding in the bases. No convincing focal pneumonia. 2. Mild cardiomegaly without evidence of CHF. Electronically Signed   By: Almira Bar M.D.   On: 03/17/2023 04:45     TODAY-DAY OF DISCHARGE:  Subjective:   Ricardo Howard today has no headache,no chest abdominal pain,no new weakness tingling or numbness, feels much better wants to go home today.  Objective:   Blood pressure (!) 127/91, pulse 78, temperature (!) 97.5 F (36.4 C), temperature source Oral, resp. rate 18, height 5\' 8"  (1.727 m), weight 100.8 kg, SpO2 94%.  Intake/Output Summary (Last 24 hours) at 03/21/2023 0918 Last data filed at 03/21/2023 0552 Gross per 24 hour  Intake 243 ml  Output 1950 ml  Net -1707 ml   Filed Weights   03/17/23 0344  Weight: 100.8 kg    Exam: Awake Alert, Oriented *3, No new F.N deficits, Normal affect Stoy.AT,PERRAL Supple Neck,No JVD, No cervical lymphadenopathy appriciated.  Symmetrical Chest wall movement, Good air movement bilaterally, CTAB RRR,No Gallops,Rubs or new Murmurs, No Parasternal Heave +ve B.Sounds, Abd Soft, Non tender, No organomegaly appriciated, No rebound -guarding or rigidity. No Cyanosis, Clubbing or edema, No new Rash or bruise   PERTINENT RADIOLOGIC STUDIES: No results found.   PERTINENT LAB RESULTS: CBC: Recent Labs    03/19/23 0012  03/20/23 0428  WBC 7.5 5.2  HGB  14.7 14.6  HCT 43.3 43.8  PLT 114* 126*   CMET CMP     Component Value Date/Time   NA 138 03/21/2023 0457   NA 141 09/07/2021 0900   K 3.8 03/21/2023 0457   CL 105 03/21/2023 0457   CO2 25 03/21/2023 0457   GLUCOSE 108 (H) 03/21/2023 0457   BUN 11 03/21/2023 0457   BUN 17 09/07/2021 0900   CREATININE 0.69 03/21/2023 0457   CALCIUM 8.6 (L) 03/21/2023 0457   PROT 6.7 03/18/2023 0517   ALBUMIN 3.0 (L) 03/18/2023 0517   AST 127 (H) 03/18/2023 0517   ALT 97 (H) 03/18/2023 0517   ALKPHOS 69 03/18/2023 0517   BILITOT 0.7 03/18/2023 0517   EGFR 97 09/07/2021 0900   GFRNONAA >60 03/21/2023 0457    GFR Estimated Creatinine Clearance: 108.8 mL/min (by C-G formula based on SCr of 0.69 mg/dL). No results for input(s): "LIPASE", "AMYLASE" in the last 72 hours. No results for input(s): "CKTOTAL", "CKMB", "CKMBINDEX", "TROPONINI" in the last 72 hours. Invalid input(s): "POCBNP" No results for input(s): "DDIMER" in the last 72 hours. No results for input(s): "HGBA1C" in the last 72 hours. No results for input(s): "CHOL", "HDL", "LDLCALC", "TRIG", "CHOLHDL", "LDLDIRECT" in the last 72 hours. No results for input(s): "TSH", "T4TOTAL", "T3FREE", "THYROIDAB" in the last 72 hours.  Invalid input(s): "FREET3" No results for input(s): "VITAMINB12", "FOLATE", "FERRITIN", "TIBC", "IRON", "RETICCTPCT" in the last 72 hours. Coags: No results for input(s): "INR" in the last 72 hours.  Invalid input(s): "PT" Microbiology: Recent Results (from the past 240 hours)  Blood Culture (routine x 2)     Status: None (Preliminary result)   Collection Time: 03/17/23  3:50 AM   Specimen: BLOOD  Result Value Ref Range Status   Specimen Description BLOOD LEFT ANTECUBITAL  Final   Special Requests   Final    BOTTLES DRAWN AEROBIC AND ANAEROBIC Blood Culture results may not be optimal due to an inadequate volume of blood received in culture bottles   Culture   Final    NO  GROWTH 3 DAYS Performed at [facility], [address], [place], [place] [address]    Report Status PENDING  Incomplete  Resp panel by RT-PCR (RSV, Flu A&B, Covid) Anterior Nasal Swab     Status: Abnormal   Collection Time: 03/17/23  3:51 AM   Specimen: Anterior Nasal Swab  Result Value Ref Range Status   SARS Coronavirus 2 by RT PCR NEGATIVE NEGATIVE Final   Influenza A by PCR POSITIVE (A) NEGATIVE Final   Influenza B by PCR NEGATIVE NEGATIVE Final    Comment: (NOTE) The Xpert Xpress SARS-CoV-2/FLU/RSV plus assay is intended as an aid in the diagnosis of influenza from Nasopharyngeal swab specimens and should not be used as a sole basis for treatment. Nasal washings and aspirates are unacceptable for Xpert Xpress SARS-CoV-2/FLU/RSV testing.  Fact Sheet for Patients: BloggerCourse.com  Fact Sheet for Healthcare Providers: SeriousBroker.it  This test is not yet approved or cleared by the [place] FDA and has been authorized for detection and/or diagnosis of SARS-CoV-2 by FDA under an Emergency Use Authorization (EUA). This EUA will remain in effect (meaning this test can be used) for the duration of the COVID-19 declaration under Section 564(b)(1) of the Act, 21 U.S.C. section 360bbb-3(b)(1), unless the authorization is terminated or revoked.     Resp Syncytial Virus by PCR NEGATIVE NEGATIVE Final    Comment: (NOTE) Fact Sheet for Patients: BloggerCourse.com  Fact Sheet for  Healthcare Providers: SeriousBroker.it  This test is not yet approved or cleared by the [place] and has been authorized for detection and/or diagnosis of SARS-CoV-2 by FDA under an Emergency Use Authorization (EUA). This EUA will remain in effect (meaning this test can be used) for the duration of the COVID-19 declaration under Section 564(b)(1) of the Act, 21 U.S.C. section  360bbb-3(b)(1), unless the authorization is terminated or revoked.  Performed at [facility], [address], [place], [place] [address]   Blood Culture (routine x 2)     Status: None (Preliminary result)   Collection Time: 03/17/23  3:55 AM   Specimen: BLOOD  Result Value Ref Range Status   Specimen Description BLOOD RIGHT ANTECUBITAL  Final   Special Requests   Final    BOTTLES DRAWN AEROBIC AND ANAEROBIC Blood Culture results may not be optimal due to an inadequate volume of blood received in culture bottles   Culture   Final    NO GROWTH 3 DAYS Performed at [facility], [address], [place], [place] [address]    Report Status PENDING  Incomplete  Urine Culture (for pregnant, neutropenic or urologic patients or patients with an indwelling urinary catheter)     Status: None   Collection Time: 03/17/23 11:21 AM   Specimen: Urine, Catheterized  Result Value Ref Range Status   Specimen Description URINE, CATHETERIZED  Final   Special Requests NONE  Final   Culture   Final    NO GROWTH Performed at [facility], [address], [place], [place] [address]    Report Status 03/18/2023 FINAL  Final  Respiratory (~20 pathogens) panel by PCR     Status: Abnormal   Collection Time: 03/19/23 11:12 AM   Specimen: Nasopharyngeal Swab; Respiratory  Result Value Ref Range Status   Adenovirus NOT DETECTED NOT DETECTED Final   Coronavirus 229E NOT DETECTED NOT DETECTED Final    Comment: (NOTE) The Coronavirus on the Respiratory Panel, DOES NOT test for the novel  Coronavirus (2019 nCoV)    Coronavirus HKU1 NOT DETECTED NOT DETECTED Final   Coronavirus NL63 NOT DETECTED NOT DETECTED Final   Coronavirus OC43 NOT DETECTED NOT DETECTED Final   Metapneumovirus NOT DETECTED NOT DETECTED Final   Rhinovirus / Enterovirus NOT DETECTED NOT DETECTED Final   Influenza A H3 DETECTED (A) NOT DETECTED Final   Influenza B NOT DETECTED NOT DETECTED Final   Parainfluenza Virus 1  NOT DETECTED NOT DETECTED Final   Parainfluenza Virus 2 NOT DETECTED NOT DETECTED Final   Parainfluenza Virus 3 NOT DETECTED NOT DETECTED Final   Parainfluenza Virus 4 NOT DETECTED NOT DETECTED Final   Respiratory Syncytial Virus NOT DETECTED NOT DETECTED Final   Bordetella pertussis NOT DETECTED NOT DETECTED Final   Bordetella Parapertussis NOT DETECTED NOT DETECTED Final   Chlamydophila pneumoniae NOT DETECTED NOT DETECTED Final   Mycoplasma pneumoniae NOT DETECTED NOT DETECTED Final    Comment: Performed at [facility], [address], [place], [place] [address]    FURTHER DISCHARGE INSTRUCTIONS:  Get Medicines reviewed and adjusted: Please take all your medications with you for your next visit with your Primary MD  Laboratory/radiological data: Please request your Primary MD to go over all hospital tests and procedure/radiological results at the follow up, please ask your Primary MD to get all Hospital records sent to his/her office.  In some cases, they will be blood work, cultures and biopsy results pending at the  time of your discharge. Please request that your primary care M.D. goes through all the records of your hospital data and follows up on these results.  Also Note the following: If you experience worsening of your admission symptoms, develop shortness of breath, life threatening emergency, suicidal or homicidal thoughts you must seek medical attention immediately by calling 911 or calling your MD immediately  if symptoms less severe.  You must read complete instructions/literature along with all the possible adverse reactions/side effects for all the Medicines you take and that have been prescribed to you. Take any new Medicines after you have completely understood and accpet all the possible adverse reactions/side effects.   Do not drive when taking Pain medications or sleeping medications (Benzodaizepines)  Do not take more than prescribed Pain, Sleep and Anxiety  Medications. It is not advisable to combine anxiety,sleep and pain medications without talking with your primary care practitioner  Special Instructions: If you have smoked or chewed Tobacco  in the last 2 yrs please stop smoking, stop any regular Alcohol  and or any Recreational drug use.  Wear Seat belts while driving.  Please note: You were cared for by a hospitalist during your hospital stay. Once you are discharged, your primary care physician will handle any further medical issues. Please note that NO REFILLS for any discharge medications will be authorized once you are discharged, as it is imperative that you return to your primary care physician (or establish a relationship with a primary care physician if you do not have one) for your post hospital discharge needs so that they can reassess your need for medications and monitor your lab values.  Total Time spent coordinating discharge including counseling, education and face to face time equals greater than 30 minutes.  Signed: Jeoffrey Massed 03/21/2023 9:18 AM

## 2023-03-21 NOTE — TOC Transition Note (Signed)
Transition of Care [facility]) - Discharge Note   Patient Details  Name: Ricardo Howard MRN: [id number] Date of Birth: [birth year]  Transition of Care [name] CM/SW Contact:  Gordy Clement, RN Phone Number: 03/21/2023, 9:51 AM   Clinical Narrative:     Patient will DC to home today. [name] will provide Home Health. RNCM  spoke to Wife- Patient already has the recommended BSC. Wife or caregiver to transport and will be bringing portable O2  No additional TOC needs           Patient Goals and CMS Choice            Discharge Placement                       Discharge Plan and Services Additional resources added to the After Visit Summary for                                       Social Drivers of Health (SDOH) Interventions SDOH Screenings   Food Insecurity: No Food Insecurity (03/18/2023)  Housing: Low Risk  (03/18/2023)  Transportation Needs: No Transportation Needs (03/18/2023)  Recent Concern: Transportation Needs - Unmet Transportation Needs (03/18/2023)  Utilities: Not At Risk (03/18/2023)  Depression (PHQ2-9): Low Risk  (11/07/2021)  Social Connections: Unknown (07/02/2021)   Received from [facility]  Tobacco Use: High Risk (03/17/2023)     Readmission Risk Interventions     No data to display

## 2023-03-22 LAB — CULTURE, BLOOD (ROUTINE X 2)
Culture: NO GROWTH
Culture: NO GROWTH

## 2023-03-29 DIAGNOSIS — R338 Other retention of urine: Secondary | ICD-10-CM | POA: Diagnosis not present

## 2023-03-29 DIAGNOSIS — R310 Gross hematuria: Secondary | ICD-10-CM | POA: Diagnosis not present

## 2023-04-05 ENCOUNTER — Encounter (HOSPITAL_COMMUNITY): Payer: Self-pay

## 2023-04-05 ENCOUNTER — Emergency Department (HOSPITAL_COMMUNITY): Payer: 59

## 2023-04-05 ENCOUNTER — Emergency Department (HOSPITAL_COMMUNITY)
Admission: EM | Admit: 2023-04-05 | Discharge: 2023-04-06 | Disposition: A | Discharge status: Home / Self Care | Payer: 59 | Attending: Emergency Medicine | Admitting: Emergency Medicine

## 2023-04-05 ENCOUNTER — Other Ambulatory Visit: Payer: Self-pay

## 2023-04-05 DIAGNOSIS — J189 Pneumonia, unspecified organism: Secondary | ICD-10-CM

## 2023-04-05 DIAGNOSIS — I509 Heart failure, unspecified: Secondary | ICD-10-CM | POA: Diagnosis not present

## 2023-04-05 DIAGNOSIS — R059 Cough, unspecified: Secondary | ICD-10-CM | POA: Diagnosis not present

## 2023-04-05 DIAGNOSIS — R0789 Other chest pain: Secondary | ICD-10-CM | POA: Insufficient documentation

## 2023-04-05 DIAGNOSIS — I6782 Cerebral ischemia: Secondary | ICD-10-CM | POA: Diagnosis not present

## 2023-04-05 DIAGNOSIS — J181 Lobar pneumonia, unspecified organism: Secondary | ICD-10-CM | POA: Insufficient documentation

## 2023-04-05 DIAGNOSIS — I2510 Atherosclerotic heart disease of native coronary artery without angina pectoris: Secondary | ICD-10-CM | POA: Insufficient documentation

## 2023-04-05 DIAGNOSIS — R4781 Slurred speech: Secondary | ICD-10-CM | POA: Insufficient documentation

## 2023-04-05 DIAGNOSIS — Z7982 Long term (current) use of aspirin: Secondary | ICD-10-CM | POA: Insufficient documentation

## 2023-04-05 DIAGNOSIS — E860 Dehydration: Secondary | ICD-10-CM | POA: Diagnosis not present

## 2023-04-05 DIAGNOSIS — I63231 Cerebral infarction due to unspecified occlusion or stenosis of right carotid arteries: Secondary | ICD-10-CM | POA: Diagnosis not present

## 2023-04-05 DIAGNOSIS — R29818 Other symptoms and signs involving the nervous system: Secondary | ICD-10-CM | POA: Diagnosis not present

## 2023-04-05 DIAGNOSIS — Z79899 Other long term (current) drug therapy: Secondary | ICD-10-CM | POA: Insufficient documentation

## 2023-04-05 DIAGNOSIS — E872 Acidosis, unspecified: Secondary | ICD-10-CM

## 2023-04-05 DIAGNOSIS — J449 Chronic obstructive pulmonary disease, unspecified: Secondary | ICD-10-CM | POA: Diagnosis not present

## 2023-04-05 DIAGNOSIS — I110 Hypertensive heart disease with heart failure: Secondary | ICD-10-CM | POA: Insufficient documentation

## 2023-04-05 DIAGNOSIS — E1165 Type 2 diabetes mellitus with hyperglycemia: Secondary | ICD-10-CM | POA: Insufficient documentation

## 2023-04-05 DIAGNOSIS — Z8673 Personal history of transient ischemic attack (TIA), and cerebral infarction without residual deficits: Secondary | ICD-10-CM | POA: Diagnosis not present

## 2023-04-05 DIAGNOSIS — I700 Atherosclerosis of aorta: Secondary | ICD-10-CM | POA: Diagnosis not present

## 2023-04-05 DIAGNOSIS — R4782 Fluency disorder in conditions classified elsewhere: Secondary | ICD-10-CM | POA: Diagnosis present

## 2023-04-05 DIAGNOSIS — I959 Hypotension, unspecified: Secondary | ICD-10-CM | POA: Diagnosis not present

## 2023-04-05 DIAGNOSIS — Z794 Long term (current) use of insulin: Secondary | ICD-10-CM | POA: Insufficient documentation

## 2023-04-05 DIAGNOSIS — R739 Hyperglycemia, unspecified: Secondary | ICD-10-CM | POA: Diagnosis not present

## 2023-04-05 DIAGNOSIS — I10 Essential (primary) hypertension: Secondary | ICD-10-CM | POA: Diagnosis not present

## 2023-04-05 LAB — I-STAT VENOUS BLOOD GAS, ED
Acid-base deficit: 2 mmol/L (ref 0.0–2.0)
Bicarbonate: 22.4 mmol/L (ref 20.0–28.0)
Calcium, Ion: 0.77 mmol/L — CL (ref 1.15–1.40)
HCT: 49 % (ref 39.0–52.0)
Hemoglobin: 16.7 g/dL (ref 13.0–17.0)
O2 Saturation: 83 %
Potassium: >8.5 mmol/L (ref 3.5–5.1)
Sodium: 126 mmol/L — ABNORMAL LOW (ref 135–145)
TCO2: 23 mmol/L (ref 22–32)
pCO2, Ven: 36.2 mm[Hg] — ABNORMAL LOW (ref 44–60)
pH, Ven: 7.399 (ref 7.25–7.43)
pO2, Ven: 47 mm[Hg] — ABNORMAL HIGH (ref 32–45)

## 2023-04-05 LAB — URINALYSIS, W/ REFLEX TO CULTURE (INFECTION SUSPECTED)
Bilirubin Urine: NEGATIVE
Glucose, UA: >=500 mg/dL — AB
Ketones, ur: NEGATIVE mg/dL
Nitrite: NEGATIVE
Protein, ur: NEGATIVE mg/dL
RBC / HPF: >50 RBC/hpf (ref 0–5)
Specific Gravity, Urine: 1.028 (ref 1.005–1.030)
pH: 6 (ref 5.0–8.0)

## 2023-04-05 LAB — COMPREHENSIVE METABOLIC PANEL
ALT: 50 U/L — ABNORMAL HIGH (ref 0–44)
AST: 45 U/L — ABNORMAL HIGH (ref 15–41)
Albumin: 3.2 g/dL — ABNORMAL LOW (ref 3.5–5.0)
Alkaline Phosphatase: 69 U/L (ref 38–126)
Anion gap: 11 (ref 5–15)
BUN: 10 mg/dL (ref 8–23)
CO2: 26 mmol/L (ref 22–32)
Calcium: 8.9 mg/dL (ref 8.9–10.3)
Chloride: 104 mmol/L (ref 98–111)
Creatinine, Ser: 0.94 mg/dL (ref 0.61–1.24)
GFR, Estimated: >60 mL/min (ref >60)
Glucose, Bld: 203 mg/dL — ABNORMAL HIGH (ref 70–99)
Potassium: 4.3 mmol/L (ref 3.5–5.1)
Sodium: 141 mmol/L (ref 135–145)
Total Bilirubin: 0.6 mg/dL (ref 0.0–1.2)
Total Protein: 7.1 g/dL (ref 6.5–8.1)

## 2023-04-05 LAB — CBG MONITORING, ED
Glucose-Capillary: 370 mg/dL — ABNORMAL HIGH (ref 70–99)
Glucose-Capillary: 193 mg/dL — ABNORMAL HIGH (ref 70–99)

## 2023-04-05 LAB — CBC WITH DIFFERENTIAL/PLATELET
Abs Immature Granulocytes: 0.05 10*3/uL (ref 0.00–0.07)
Basophils Absolute: 0 10*3/uL (ref 0.0–0.1)
Basophils Relative: 1 %
Eosinophils Absolute: 0.1 10*3/uL (ref 0.0–0.5)
Eosinophils Relative: 1 %
HCT: 48.9 % (ref 39.0–52.0)
Hemoglobin: 15.9 g/dL (ref 13.0–17.0)
Immature Granulocytes: 1 %
Lymphocytes Relative: 24 %
Lymphs Abs: 1.5 10*3/uL (ref 0.7–4.0)
MCH: 30.6 pg (ref 26.0–34.0)
MCHC: 32.5 g/dL (ref 30.0–36.0)
MCV: 94 fL (ref 80.0–100.0)
Monocytes Absolute: 0.4 10*3/uL (ref 0.1–1.0)
Monocytes Relative: 7 %
Neutro Abs: 4.2 10*3/uL (ref 1.7–7.7)
Neutrophils Relative %: 66 %
Platelets: 262 10*3/uL (ref 150–400)
RBC: 5.2 MIL/uL (ref 4.22–5.81)
RDW: 17.9 % — ABNORMAL HIGH (ref 11.5–15.5)
WBC: 6.3 10*3/uL (ref 4.0–10.5)
nRBC: 0 % (ref 0.0–0.2)

## 2023-04-05 LAB — I-STAT CHEM 8, ED
BUN: 15 mg/dL (ref 8–23)
Calcium, Ion: 0.97 mmol/L — ABNORMAL LOW (ref 1.15–1.40)
Chloride: 109 mmol/L (ref 98–111)
Creatinine, Ser: 0.7 mg/dL (ref 0.61–1.24)
Glucose, Bld: 309 mg/dL — ABNORMAL HIGH (ref 70–99)
HCT: 47 % (ref 39.0–52.0)
Hemoglobin: 16 g/dL (ref 13.0–17.0)
Potassium: 5.9 mmol/L — ABNORMAL HIGH (ref 3.5–5.1)
Sodium: 138 mmol/L (ref 135–145)
TCO2: 23 mmol/L (ref 22–32)

## 2023-04-05 LAB — I-STAT CG4 LACTIC ACID, ED
Lactic Acid, Venous: 2.4 mmol/L (ref 0.5–1.9)
Lactic Acid, Venous: 2.4 mmol/L (ref 0.5–1.9)

## 2023-04-05 LAB — BETA-HYDROXYBUTYRIC ACID: Beta-Hydroxybutyric Acid: 0.42 mmol/L — ABNORMAL HIGH (ref 0.05–0.27)

## 2023-04-05 LAB — AMMONIA: Ammonia: 13 umol/L (ref 9–35)

## 2023-04-05 LAB — ETHANOL: Alcohol, Ethyl (B): <10 mg/dL (ref <10)

## 2023-04-05 MED ORDER — SODIUM CHLORIDE 0.9 % IV SOLN
500.0000 mg | Freq: Once | INTRAVENOUS | Status: AC
  Administered 2023-04-05: 500 mg via INTRAVENOUS
  Filled 2023-04-05: qty 5

## 2023-04-05 MED ORDER — SODIUM CHLORIDE 0.9 % IV SOLN
2.0000 g | Freq: Once | INTRAVENOUS | Status: AC
  Administered 2023-04-05: 2 g via INTRAVENOUS
  Filled 2023-04-05: qty 20

## 2023-04-05 MED ORDER — LACTATED RINGERS IV BOLUS
1000.0000 mL | Freq: Once | INTRAVENOUS | Status: AC
  Administered 2023-04-05: 1000 mL via INTRAVENOUS

## 2023-04-05 NOTE — ED Provider Notes (Signed)
[place] EMERGENCY DEPARTMENT AT [facility] Provider Note   CSN: [id number] Arrival date & time: 04/05/23  1122     History  Chief Complaint  Patient presents with   Hyperglycemia    Ricardo Howard is a 63 y.o. male.  HPI     63yo male with history hypertension, hyperlipidemia, COPD with chronic hypoxic respiratory failure on 3 L of oxygen, urinary retention, DM, Ozempic induced pancreatitis, CAD, PAD status post right BKA, recent admission for COPD exacerbation and influenza with urinary retention (required urology placed coude catheter) who presents with generalized weakness, continued cough, slurred speech, hypotension and hyperglycemia.   Blood in the foley bag for a few days, otherwise in normal state of health until last night  Last night seemed fatigued, both recovering from the flu (had it 2 weeks ago) , fatigued, moving slowly, hard time walking to the bathroom this AM  Cough continuing since then, right sided pain on ribs with movement Seemed out of it this morning, slurring, question of drooping of face  Therapist had him try to stand and he couldn't stand on his own BP last night was 116/84   Glucose has been high and low, but not as high as 370, usually when wakes up it is 109, but today was 300s when woke up/fasting  2 weeks ago placed, 1 week ago was changed, trouble urinating, enlarged prostate can't void, went to see Dr. Derrill Kay? Urologist, couldn't void on own and had catheter placed, supposed to go back  12 pack etoh/day  Past Medical History:  Diagnosis Date   Alcohol abuse 06/14/2019   Anxiety    Back pain    Cataracts, bilateral    CHF (congestive heart failure) (HCC)    Depression    Diabetic retinopathy (HCC)    Essential hypertension 06/14/2019   Hepatitis C    cured   Hx of BKA, right (HCC)    Hyperlipidemia    Hypertension    Mixed hyperlipidemia due to type 2 diabetes mellitus (HCC) 07/31/2019   Nicotine dependence,  cigarettes, uncomplicated 07/31/2019   PAD (peripheral artery disease) (HCC)    Pancreatitis    PTSD (post-traumatic stress disorder)    Uncontrolled type 2 diabetes mellitus with diabetic polyneuropathy, with long-term current use of insulin 07/31/2019     Home Medications Prior to Admission medications   Medication Sig Start Date End Date Taking? Authorizing Provider  acetaminophen (TYLENOL) 325 MG tablet Take 2 tablets (650 mg total) by mouth every 6 (six) hours as needed for mild pain (or Fever >/= 101). 06/20/22   Elgergawy, Leana Roe, MD  albuterol (VENTOLIN HFA) 108 (90 Base) MCG/ACT inhaler Inhale 2 puffs into the lungs every 6 (six) hours as needed for wheezing or shortness of breath. 03/21/23   Ghimire, Werner Lean, MD  aspirin EC 81 MG tablet Take 81 mg by mouth in the morning.    [provider]  atorvastatin (LIPITOR) 80 MG tablet Take 1 tablet (80 mg total) by mouth at bedtime. 08/30/21   Wendall Stade, MD  budesonide-formoterol (SYMBICORT) 160-4.5 MCG/ACT inhaler Inhale 2 puffs into the lungs 2 (two) times daily. 03/21/23   Ghimire, Werner Lean, MD  buPROPion (WELLBUTRIN SR) 150 MG 12 hr tablet Take 150 mg by mouth 2 (two) times daily.    [provider]  clopidogrel (PLAVIX) 75 MG tablet Take 75 mg by mouth in the morning.    [provider]  empagliflozin (JARDIANCE) 25 MG TABS tablet  Take 1 tablet (25 mg total) by mouth daily. Patient taking differently: Take 12.5 mg by mouth daily. 08/30/21   Wendall Stade, MD  finasteride (PROSCAR) 5 MG tablet Take 5 mg by mouth in the morning.    [provider]  folic acid (FOLVITE) 1 MG tablet Take 1 tablet (1 mg total) by mouth daily. Patient taking differently: Take 1 mg by mouth in the morning. 10/11/19   Rodolph Bong, MD  furosemide (LASIX) 40 MG tablet Take 1 tablet (40 mg total) by mouth daily. Patient taking differently: Take 40 mg by mouth in the morning. Take 1 tablet (40 mg total) by mouth  daily. 06/23/22   Elgergawy, Leana Roe, MD  gabapentin (NEURONTIN) 300 MG capsule Take 300 mg by mouth 3 (three) times daily. Take 1 capsule (300 mg) by mouth in the morning, take 1 capsule (300 mg) by mouth at lunch, & take 2 capsules (600 mg) by mouth at bedtime    [provider]  guaiFENesin (MUCINEX) 600 MG 12 hr tablet Take 1 tablet (600 mg total) by mouth 2 (two) times daily. 02/14/23   Osvaldo Shipper, MD  insulin aspart (NOVOLOG) 100 UNIT/ML injection Inject 10 Units into the skin 3 (three) times daily with meals. 02/14/23   Osvaldo Shipper, MD  insulin glargine (LANTUS) 100 UNIT/ML Solostar Pen 30units in am and 35unit at night  Further adjustment per your pcp and endocrinology Goal of a1c is less than 7%, currently your a1c is 10%, not at goal Patient taking differently: Inject 65 Units into the skin daily. Further adjustment per your pcp and endocrinology Goal of a1c is less than 7%, currently your a1c is 10%, not at goal 06/03/21   Albertine Grates, MD  Insulin Pen Needle (NOVOFINE) 30G X 8 MM MISC Inject 10 each into the skin as needed. 10/10/19   Rodolph Bong, MD  metoprolol tartrate (LOPRESSOR) 100 MG tablet Take 50 mg by mouth in the morning.    [provider]  Multiple Vitamin (QUINTABS) TABS Take 1 tablet by mouth daily.    [provider]  naproxen sodium (ALEVE) 220 MG tablet Take 220 mg by mouth daily as needed (headache). Patient not taking: Reported on 03/17/2023    [provider]  nitroGLYCERIN (NITROSTAT) 0.4 MG SL tablet Place 1 tablet (0.4 mg total) under the tongue every 5 (five) minutes as needed for chest pain. 07/19/21   Evlyn Kanner, MD  potassium chloride SA (KLOR-CON M) 20 MEQ tablet Take 1 tablet (20 mEq total) by mouth daily. Patient taking differently: Take 20 mEq by mouth daily after lunch. 08/31/21   Bhagat, Sharrell Ku, PA  predniSONE (DELTASONE) 10 MG tablet Take 40 mg daily for 1 day, 30 mg daily for 1 day, 20 mg daily for 1  days,10 mg daily for 1 day, then stop 03/21/23   Maretta Bees, MD  ranolazine (RANEXA) 500 MG 12 hr tablet Take 500 mg by mouth 2 (two) times daily. 09/27/21   [provider]  sacubitril-valsartan (ENTRESTO) 24-26 MG Take 1 tablet by mouth 2 (two) times daily. 06/25/22   Elgergawy, Leana Roe, MD  sertraline (ZOLOFT) 100 MG tablet Take 150 mg by mouth daily.    [provider]  tamsulosin (FLOMAX) 0.4 MG CAPS capsule Take 0.4 mg by mouth daily.    [provider]  thiamine (VITAMIN B-1) 100 MG tablet Take 1 tablet (100 mg total) by mouth daily. 02/15/23   Osvaldo Shipper, MD  Allergies    Iohexol, Ozempic (0.25 or 0.5 mg-dose) [semaglutide(0.25 or 0.5mg -dos)], Shellfish allergy, Milk (cow), Zestril [lisinopril], and Zocor [simvastatin]    Review of Systems   Review of Systems  Physical Exam Updated Vital Signs BP 131/89   Pulse 73   Temp 97.6 F (36.4 C) (Oral)   Resp 15   Ht 5\' 8"  (1.727 m)   Wt 104.3 kg   SpO2 98%   BMI 34.97 kg/m  Physical Exam Vitals and nursing note reviewed.  Constitutional:      General: He is not in acute distress.    Appearance: He is well-developed. He is not diaphoretic.  HENT:     Head: Normocephalic and atraumatic.  Eyes:     Conjunctiva/sclera: Conjunctivae normal.  Cardiovascular:     Rate and Rhythm: Normal rate and regular rhythm.     Heart sounds: Normal heart sounds. No murmur heard.    No friction rub. No gallop.  Pulmonary:     Effort: Pulmonary effort is normal. No respiratory distress.     Breath sounds: Normal breath sounds. No wheezing or rales.  Chest:     Chest wall: Tenderness (has tenderness to right lateral back) present.  Abdominal:     General: There is no distension.     Palpations: Abdomen is soft.     Tenderness: There is no abdominal tenderness. There is no guarding.  Musculoskeletal:     Cervical back: Normal range of motion.  Skin:    General: Skin is warm and dry.  Neurological:      Mental Status: He is alert and oriented to person, place, and time.     ED Results / Procedures / Treatments   Labs (all labs ordered are listed, but only abnormal results are displayed) Labs Reviewed  CBC WITH DIFFERENTIAL/PLATELET - Abnormal; Notable for the following components:      Result Value   RDW 17.9 (*)    All other components within normal limits  CBG MONITORING, ED - Abnormal; Notable for the following components:   Glucose-Capillary 370 (*)    All other components within normal limits  I-STAT VENOUS BLOOD GAS, ED - Abnormal; Notable for the following components:   pCO2, Ven 36.2 (*)    pO2, Ven 47 (*)    Sodium 126 (*)    Potassium >8.5 (*)    Calcium, Ion 0.77 (*)    All other components within normal limits  I-STAT CG4 LACTIC ACID, ED - Abnormal; Notable for the following components:   Lactic Acid, Venous 2.4 (*)    All other components within normal limits  I-STAT CG4 LACTIC ACID, ED - Abnormal; Notable for the following components:   Lactic Acid, Venous 2.4 (*)    All other components within normal limits  I-STAT CHEM 8, ED - Abnormal; Notable for the following components:   Potassium 5.9 (*)    Glucose, Bld 309 (*)    Calcium, Ion 0.97 (*)    All other components within normal limits  URINALYSIS, W/ REFLEX TO CULTURE (INFECTION SUSPECTED)  BETA-HYDROXYBUTYRIC ACID  COMPREHENSIVE METABOLIC PANEL  AMMONIA  PROTIME-INR  ETHANOL  CBG MONITORING, ED  I-STAT CHEM 8, ED  CBG MONITORING, ED    EKG EKG Interpretation Date/Time:  Friday April 05 2023 13:28:52 EST Ventricular Rate:  72 PR Interval:  148 QRS Duration:  98 QT Interval:  423 QTC Calculation: 463 R Axis:   66  Text Interpretation: Sinus rhythm No significant change since last tracing  Confirmed by Alvira Monday ([id number]) on 04/05/2023 2:11:47 PM  Radiology No results found.  Procedures Procedures    Medications Ordered in ED Medications  cefTRIAXone (ROCEPHIN) 2 g in sodium  chloride 0.9 % 100 mL IVPB (has no administration in time range)  azithromycin (ZITHROMAX) 500 mg in sodium chloride 0.9 % 250 mL IVPB (has no administration in time range)  lactated ringers bolus 1,000 mL (0 mLs Intravenous Stopped 04/05/23 1457)    ED Course/ Medical Decision Making/ A&P                                    62yo male with history hypertension, hyperlipidemia, COPD with chronic hypoxic respiratory failure on 3 L of oxygen, urinary retention, DM, Ozempic induced pancreatitis, CAD, PAD status post right BKA, recent admission for COPD exacerbation and influenza with urinary retention (required urology placed coude catheter) who presents with generalized weakness, continued cough, slurred speech, hypotension and hyperglycemia.  Initial blood pressures 80-100s, has improved with fluids.   At this time, suspect dehydration in setting of hyperglycemia as etiology of his hypotension.  He does not have a leukocytosis and have a lower suspicion for sepsis.  His lactic acid is elevated to 2.4 and given this did order empiric antibiotics to cover for community-acquired pneumonia in the setting of continued cough.  Normal bilateral upper extremity pulses and left lower extremity pulse and have low suspicion for acute aortic dissection by history and exam.  Low suspicion for acute PE given no acute dyspnea.  He does have some right back pain that is worse when he moves and with palpation and appears to be more likely musculoskeletal.  I-STAT labs do not show signs of DKA.    Slurred speech:  DDx includes CVA, ICH, metabolic abnormality including HHS, etoh use, other drugs. -----labs including CMP, ammonia, etoh pending. Radiology read of CT head pending. No clear ICH on my evaluation. Have ordered MRI brain.   UA pending. Has foley that required urology replacement in the past. At this time will order UA from foley.   Anticipate need for admission pending labs, imaging, given hypotension on  arrival and acute change in functional abilities.           Final Clinical Impression(s) / ED Diagnoses Final diagnoses:  Hyperglycemia  Lactic acidosis  Slurred speech  Hypotension, unspecified hypotension type  Dehydration  Pneumonia due to infectious organism, unspecified laterality, unspecified part of lung    Rx / DC Orders ED Discharge Orders     None         Alvira Monday, MD 04/05/23 1626

## 2023-04-05 NOTE — ED Triage Notes (Signed)
Pt here for hyperglycemia, family states pt has been slurring speech since yesterday at 1400. Normal fasting BS is 150, pt took insulin last night. Denies weakness/blurred vision. HX of same.

## 2023-04-05 NOTE — ED Provider Notes (Addendum)
Patient's MRI brain raise some concerns about vertebral problems.  Discussed with Dr. Luisa Hart neurohospitalist does not think there is anything abnormal with the post callosum.  Based on the study he recommended CT angio head and neck the patient has dye allergy so we can do MRA head and neck.  Patient's labs actually turned out to be normal.  Complete metabolic panel potassium was 4.3 and his GFR was greater than 60.  So no concerns for hyperkalemia as noted earlier.  Patient was treated with some antibiotics because he was recently in the hospital for pneumonia but chest x-ray here had no acute findings.     Vanetta Mulders, MD 04/05/23 [facility]    Vanetta Mulders, MD 04/05/23 2008

## 2023-04-05 NOTE — ED Provider Triage Note (Signed)
Emergency Medicine Provider Triage Evaluation Note  Ricardo Howard , a 63 y.o. male  was evaluated in triage.  Pt complains of slurred speech, hyperglycemia, generalized weakness.    Review of Systems  Positive: Slurred speech Negative: Focal numbness or weakness, vision change, dyspnea  Physical Exam  BP 92/68 (BP Location: Right Arm)   Pulse 75   Temp 97.9 F (36.6 C) (Oral)   Resp 16   Ht 5\' 8"  (1.727 m)   Wt 104.3 kg   SpO2 95%   BMI 34.97 kg/m  Gen:   Awake, no distress   Resp:  Normal effort  MSK:   Moves extremities without difficulty, prosthesis right LE  Other:  Slurred speech, normal strength, sensation, visual fields, no neglect, symmetric face  Medical Decision Making  Medically screening exam initiated at 12:40 PM.  Appropriate orders placed.  [name] was informed that the remainder of the evaluation will be completed by another provider, this initial triage assessment does not replace that evaluation, and the importance of remaining in the ED until their evaluation is complete.  Placed orders to further evaluate. Out of TNKase window, not consistent with LVO.  Labs, CT head ordered. Mild hypotension. Needs recheck, prefer for patient to be roomed.   Alvira Monday, MD 04/05/23 1243

## 2023-04-06 ENCOUNTER — Emergency Department (HOSPITAL_COMMUNITY): Payer: 59

## 2023-04-06 DIAGNOSIS — E1165 Type 2 diabetes mellitus with hyperglycemia: Secondary | ICD-10-CM | POA: Diagnosis not present

## 2023-04-06 DIAGNOSIS — I63231 Cerebral infarction due to unspecified occlusion or stenosis of right carotid arteries: Secondary | ICD-10-CM | POA: Diagnosis not present

## 2023-04-06 MED ORDER — AMOXICILLIN 500 MG PO CAPS: 500.0000 mg | ORAL_CAPSULE | Freq: Three times a day (TID) | ORAL | 0 refills | Status: AC

## 2023-04-06 MED ORDER — GADOBUTROL 1 MMOL/ML IV SOLN
10.0000 mL | Freq: Once | INTRAVENOUS | Status: AC | PRN
  Administered 2023-04-06: 10 mL via INTRAVENOUS

## 2023-04-06 MED ORDER — AZITHROMYCIN 250 MG PO TABS: 250.0000 mg | ORAL_TABLET | Freq: Every day | ORAL | 0 refills | Status: AC

## 2023-04-06 NOTE — ED Provider Notes (Signed)
  Provider Note MRN:  [id number]  Arrival date & time: 04/06/23    ED Course and Medical Decision Making  Assumed care of patient at sign-out or upon transfer.  Slurred speech awaiting MRA imaging, possibly a candidate for discharge, will likely reach back out to neurology.  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  Case discussed with Dr. Amada Jupiter of neurology, this finding seems completely unrelated to patient's symptoms, would not be causing slurred speech.  Patient's speech is much improved according to the patient and staff.  According to neurology no real indication for further testing or admission, already anticoagulated and on high dose anticholesterol medicine.  Appropriate for discharge with follow-up.  Prior provider had a concern for pneumonia, covering with antibiotics.  Procedures  Final Clinical Impressions(s) / ED Diagnoses     ICD-10-CM   1. Hyperglycemia  R73.9     2. Lactic acidosis  E87.20     3. Slurred speech  R47.81 Ambulatory referral to Neurology    4. Hypotension, unspecified hypotension type  I95.9     5. Dehydration  E86.0     6. Pneumonia due to infectious organism, unspecified laterality, unspecified part of lung  J18.9       ED Discharge Orders          Ordered    azithromycin (ZITHROMAX) 250 MG tablet  Daily        04/06/23 0328    amoxicillin (AMOXIL) 500 MG capsule  3 times daily        04/06/23 0328    Ambulatory referral to Neurology       Comments: An appointment is requested in approximately: 2 weeks   04/06/23 0329              Discharge Instructions      You were evaluated in the Emergency Department and after careful evaluation, we did not find any emergent condition requiring admission or further testing in the hospital.  Your exam/testing today is overall reassuring.  Recommend follow-up with the neurology specialist for continued care.  Take the antibiotics to  cover for possible pneumonia.  Please return to the Emergency Department if you experience any worsening of your condition.   Thank you for allowing [place] to be a part of your care.      Elmer Sow. Pilar Plate, MD [facility] Emergency Medicine [name] [email or web address]    Sabas Sous, MD 04/06/23 0330

## 2023-04-06 NOTE — ED Notes (Signed)
Patient transported to MRI

## 2023-04-06 NOTE — Discharge Instructions (Signed)
You were evaluated in the Emergency Department and after careful evaluation, we did not find any emergent condition requiring admission or further testing in the hospital.  Your exam/testing today is overall reassuring.  Recommend follow-up with the neurology specialist for continued care.  Take the antibiotics to cover for possible pneumonia.  Please return to the Emergency Department if you experience any worsening of your condition.   Thank you for allowing [place] to be a part of your care.

## 2023-04-07 LAB — URINE CULTURE: Culture: >=100000 — AB

## 2023-04-08 ENCOUNTER — Telehealth (HOSPITAL_BASED_OUTPATIENT_CLINIC_OR_DEPARTMENT_OTHER): Payer: Self-pay | Admitting: *Deleted

## 2023-04-08 NOTE — Telephone Encounter (Signed)
Post ED Visit - Positive Culture Follow-up  Culture report reviewed by antimicrobial stewardship pharmacist: [facility] [x]  Ivery Quale, [place].D. []  Celedonio Miyamoto, [address].D., BCPS AQ-ID []  Garvin Fila, Pharm.D., BCPS []  Georgina Pillion, Pharm.D., BCPS []  [place], [address].D., BCPS, AAHIVP []  Estella Husk, Pharm.D., BCPS, AAHIVP []  Lysle Pearl, PharmD, BCPS []  Phillips Climes, PharmD, BCPS []  Agapito Games, PharmD, BCPS []  Verlan Friends, PharmD []  Mervyn Gay, PharmD, BCPS []  Vinnie Level, PharmD  [facility] []  Len Childs, PharmD []  Greer Pickerel, PharmD []  Adalberto Cole, PharmD []  Perlie Gold, Rph []  Lonell Face) Jean Rosenthal, PharmD []  Earl Many, PharmD []  Junita Push, PharmD []  Dorna Leitz, PharmD []  Terrilee Files, PharmD []  Lynann Beaver, PharmD []  Keturah Barre, PharmD []  Loralee Pacas, PharmD []  Bernadene Person, PharmD   Positive urine culture Amoxicillin Treated with Azithromycin culture, organism sensitive to the same and no further patient follow-up is required at this time.  Zavon, Hyson 04/08/2023, 9:32 AM

## 2023-04-10 DIAGNOSIS — R338 Other retention of urine: Secondary | ICD-10-CM | POA: Diagnosis not present

## 2023-04-11 ENCOUNTER — Emergency Department (HOSPITAL_COMMUNITY)
Admission: EM | Admit: 2023-04-11 | Discharge: 2023-04-11 | Disposition: A | Discharge status: Home / Self Care | Payer: No Typology Code available for payment source

## 2023-04-11 ENCOUNTER — Encounter (HOSPITAL_COMMUNITY): Payer: Self-pay | Admitting: Emergency Medicine

## 2023-04-11 DIAGNOSIS — I509 Heart failure, unspecified: Secondary | ICD-10-CM | POA: Insufficient documentation

## 2023-04-11 DIAGNOSIS — E119 Type 2 diabetes mellitus without complications: Secondary | ICD-10-CM | POA: Insufficient documentation

## 2023-04-11 DIAGNOSIS — R42 Dizziness and giddiness: Secondary | ICD-10-CM | POA: Diagnosis not present

## 2023-04-11 DIAGNOSIS — Z7982 Long term (current) use of aspirin: Secondary | ICD-10-CM | POA: Insufficient documentation

## 2023-04-11 DIAGNOSIS — R197 Diarrhea, unspecified: Secondary | ICD-10-CM | POA: Diagnosis not present

## 2023-04-11 DIAGNOSIS — I2510 Atherosclerotic heart disease of native coronary artery without angina pectoris: Secondary | ICD-10-CM | POA: Insufficient documentation

## 2023-04-11 DIAGNOSIS — Z794 Long term (current) use of insulin: Secondary | ICD-10-CM | POA: Insufficient documentation

## 2023-04-11 DIAGNOSIS — Z960 Presence of urogenital implants: Secondary | ICD-10-CM | POA: Insufficient documentation

## 2023-04-11 DIAGNOSIS — I959 Hypotension, unspecified: Secondary | ICD-10-CM | POA: Diagnosis present

## 2023-04-11 DIAGNOSIS — I110 Hypertensive heart disease with heart failure: Secondary | ICD-10-CM | POA: Diagnosis not present

## 2023-04-11 DIAGNOSIS — N390 Urinary tract infection, site not specified: Secondary | ICD-10-CM

## 2023-04-11 DIAGNOSIS — Z720 Tobacco use: Secondary | ICD-10-CM | POA: Diagnosis not present

## 2023-04-11 LAB — CBC
HCT: 46.6 % (ref 39.0–52.0)
Hemoglobin: 15 g/dL (ref 13.0–17.0)
MCH: 30.6 pg (ref 26.0–34.0)
MCHC: 32.2 g/dL (ref 30.0–36.0)
MCV: 95.1 fL (ref 80.0–100.0)
Platelets: 143 10*3/uL — ABNORMAL LOW (ref 150–400)
RBC: 4.9 MIL/uL (ref 4.22–5.81)
RDW: 17 % — ABNORMAL HIGH (ref 11.5–15.5)
WBC: 7.6 10*3/uL (ref 4.0–10.5)
nRBC: 0 % (ref 0.0–0.2)

## 2023-04-11 LAB — URINALYSIS, W/ REFLEX TO CULTURE (INFECTION SUSPECTED)
Bilirubin Urine: NEGATIVE
Glucose, UA: >=500 mg/dL — AB
Ketones, ur: 5 mg/dL — AB
Leukocytes,Ua: NEGATIVE
Nitrite: NEGATIVE
Protein, ur: 30 mg/dL — AB
RBC / HPF: >50 RBC/hpf (ref 0–5)
Specific Gravity, Urine: 1.021 (ref 1.005–1.030)
pH: 5 (ref 5.0–8.0)

## 2023-04-11 LAB — COMPREHENSIVE METABOLIC PANEL
ALT: 38 U/L (ref 0–44)
AST: 28 U/L (ref 15–41)
Albumin: 3.3 g/dL — ABNORMAL LOW (ref 3.5–5.0)
Alkaline Phosphatase: 63 U/L (ref 38–126)
Anion gap: 11 (ref 5–15)
BUN: 19 mg/dL (ref 8–23)
CO2: 21 mmol/L — ABNORMAL LOW (ref 22–32)
Calcium: 8.9 mg/dL (ref 8.9–10.3)
Chloride: 105 mmol/L (ref 98–111)
Creatinine, Ser: 0.82 mg/dL (ref 0.61–1.24)
GFR, Estimated: >60 mL/min (ref >60)
Glucose, Bld: 152 mg/dL — ABNORMAL HIGH (ref 70–99)
Potassium: 4.1 mmol/L (ref 3.5–5.1)
Sodium: 137 mmol/L (ref 135–145)
Total Bilirubin: 1.2 mg/dL (ref 0.0–1.2)
Total Protein: 7.3 g/dL (ref 6.5–8.1)

## 2023-04-11 LAB — LIPASE, BLOOD: Lipase: 24 U/L (ref 11–51)

## 2023-04-11 MED ORDER — SULFAMETHOXAZOLE-TRIMETHOPRIM 800-160 MG PO TABS: 1.0000 | ORAL_TABLET | Freq: Two times a day (BID) | ORAL | 0 refills | Status: AC

## 2023-04-11 MED ORDER — LACTATED RINGERS IV BOLUS
1000.0000 mL | Freq: Once | INTRAVENOUS | Status: AC
  Administered 2023-04-11: 1000 mL via INTRAVENOUS

## 2023-04-11 NOTE — ED Triage Notes (Signed)
Pt here from home with c/o hypotension hx of same pt states that he has had some loose stools over the last 24 hrs

## 2023-04-11 NOTE — Discharge Instructions (Signed)
You have been evaluated for your low blood pressure.  It has improved with IV fluid as well as eating and drinking.  Your vital signs did improve.  Your urine shows signs of infection, take antibiotic as prescribed.  Follow-up closely with your doctor for further care but if your condition worsen please return to the ER for further care.

## 2023-04-11 NOTE — ED Provider Notes (Signed)
[place] EMERGENCY DEPARTMENT AT [facility] Provider Note   CSN: [id number] Arrival date & time: 04/11/23  1305     History  Chief Complaint  Patient presents with   Hypotension    Ricardo Howard is a 63 y.o. male with history of pancreatitis, insulin dependent T2DM, HTN, alcohol abuse, HLD, tobacco use, anxiety, depression, PTSD, PAD s/p right BKA, cataracts, diabetic retinopathy, CAD s/p PCI in 2023, CHF (LVEF 45-50%), chronic respiratory failure on 3 L oxygen at night, who presents to the ED complaining of some generalized weakness and low blood pressure. States for the past 3 days had some diarrhea, has resolved since. When PT came to his house today they were not able to obtain his blood pressure due to it being so low, sent him to the ED. Pt states he has had issues with low blood pressure recently. Was seen in ED last week, given antibiotics for possible pneumonia and was discharged. He felt a little lightheaded this morning, none now. No pain. 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   HPI     Home Medications Prior to Admission medications   Medication Sig Start Date End Date Taking? Authorizing Provider  acetaminophen (TYLENOL) 325 MG tablet Take 2 tablets (650 mg total) by mouth every 6 (six) hours as needed for mild pain (or Fever >/= 101). 06/20/22   Elgergawy, Leana Roe, MD  albuterol (VENTOLIN HFA) 108 (90 Base) MCG/ACT inhaler Inhale 2 puffs into the lungs every 6 (six) hours as needed for wheezing or shortness of breath. 03/21/23   Ghimire, Werner Lean, MD  amoxicillin (AMOXIL) 500 MG capsule Take 1 capsule (500 mg total) by mouth 3 (three) times daily for 7 days. 04/06/23 04/13/23  Sabas Sous, MD  aspirin EC 81 MG tablet Take 81 mg by mouth in the morning.    [provider]  atorvastatin (LIPITOR) 80 MG tablet Take 1 tablet (80 mg total) by mouth at  bedtime. 08/30/21   Wendall Stade, MD  budesonide-formoterol (SYMBICORT) 160-4.5 MCG/ACT inhaler Inhale 2 puffs into the lungs 2 (two) times daily. 03/21/23   Ghimire, Werner Lean, MD  buPROPion (WELLBUTRIN SR) 150 MG 12 hr tablet Take 150 mg by mouth 2 (two) times daily.    [provider]  clopidogrel (PLAVIX) 75 MG tablet Take 75 mg by mouth in the morning.    [provider]  empagliflozin (JARDIANCE) 25 MG TABS tablet Take 1 tablet (25 mg total) by mouth daily. Patient taking differently: Take 12.5 mg by mouth daily. 08/30/21   Wendall Stade, MD  finasteride (PROSCAR) 5 MG tablet Take 5 mg by mouth in the morning.    [provider]  folic acid (FOLVITE) 1 MG tablet Take 1 tablet (1 mg total) by mouth daily. Patient taking differently: Take 1 mg by mouth in the morning. 10/11/19   Rodolph Bong, MD  furosemide (LASIX) 40 MG tablet Take 1 tablet (40 mg total) by mouth daily. Patient taking differently: Take 40 mg by mouth in the morning. Take 1 tablet (40 mg total) by mouth daily. 06/23/22   Elgergawy, Leana Roe, MD  gabapentin (NEURONTIN) 300 MG capsule Take 300 mg by mouth 3 (three) times daily. Take 1 capsule (300 mg) by mouth in the morning, take 1 capsule (300 mg) by mouth at lunch, & take 2 capsules (600 mg) by  mouth at bedtime    [provider]  guaiFENesin (MUCINEX) 600 MG 12 hr tablet Take 1 tablet (600 mg total) by mouth 2 (two) times daily. 02/14/23   Osvaldo Shipper, MD  insulin aspart (NOVOLOG) 100 UNIT/ML injection Inject 10 Units into the skin 3 (three) times daily with meals. 02/14/23   Osvaldo Shipper, MD  insulin glargine (LANTUS) 100 UNIT/ML Solostar Pen 30units in am and 35unit at night  Further adjustment per your pcp and endocrinology Goal of a1c is less than 7%, currently your a1c is 10%, not at goal Patient taking differently: Inject 65 Units into the skin daily. Further adjustment per your pcp and endocrinology Goal of a1c is less  than 7%, currently your a1c is 10%, not at goal 06/03/21   Albertine Grates, MD  Insulin Pen Needle (NOVOFINE) 30G X 8 MM MISC Inject 10 each into the skin as needed. 10/10/19   Rodolph Bong, MD  metoprolol tartrate (LOPRESSOR) 100 MG tablet Take 50 mg by mouth in the morning.    [provider]  Multiple Vitamin (QUINTABS) TABS Take 1 tablet by mouth daily.    [provider]  naproxen sodium (ALEVE) 220 MG tablet Take 220 mg by mouth daily as needed (headache). Patient not taking: Reported on 03/17/2023    [provider]  nitroGLYCERIN (NITROSTAT) 0.4 MG SL tablet Place 1 tablet (0.4 mg total) under the tongue every 5 (five) minutes as needed for chest pain. 07/19/21   Evlyn Kanner, MD  potassium chloride SA (KLOR-CON M) 20 MEQ tablet Take 1 tablet (20 mEq total) by mouth daily. Patient taking differently: Take 20 mEq by mouth daily after lunch. 08/31/21   Bhagat, Sharrell Ku, PA  predniSONE (DELTASONE) 10 MG tablet Take 40 mg daily for 1 day, 30 mg daily for 1 day, 20 mg daily for 1 days,10 mg daily for 1 day, then stop 03/21/23   Maretta Bees, MD  ranolazine (RANEXA) 500 MG 12 hr tablet Take 500 mg by mouth 2 (two) times daily. 09/27/21   [provider]  sacubitril-valsartan (ENTRESTO) 24-26 MG Take 1 tablet by mouth 2 (two) times daily. 06/25/22   Elgergawy, Leana Roe, MD  sertraline (ZOLOFT) 100 MG tablet Take 150 mg by mouth daily.    [provider]  tamsulosin (FLOMAX) 0.4 MG CAPS capsule Take 0.4 mg by mouth daily.    [provider]  thiamine (VITAMIN B-1) 100 MG tablet Take 1 tablet (100 mg total) by mouth daily. 02/15/23   Osvaldo Shipper, MD      Allergies    Iohexol, Ozempic (0.25 or 0.5 mg-dose) [semaglutide(0.25 or 0.5mg -dos)], Shellfish allergy, Milk (cow), Zestril [lisinopril], and Zocor [simvastatin]    Review of Systems   Review of Systems  Gastrointestinal:  Positive for diarrhea.  Neurological:  Positive for weakness  and light-headedness.  All other systems reviewed and are negative.   Physical Exam Updated Vital Signs BP 114/87   Pulse 74   Temp 97.6 F (36.4 C) (Oral)   Resp 12   Ht 5\' 8"  (1.727 m)   Wt 99.8 kg   SpO2 99%   BMI 33.45 kg/m  Physical Exam Vitals and nursing note reviewed.  Constitutional:      Appearance: Normal appearance.  HENT:     Head: Normocephalic and atraumatic.  Eyes:     Conjunctiva/sclera: Conjunctivae normal.  Cardiovascular:     Rate and Rhythm: Normal rate and regular rhythm.  Pulmonary:  Effort: Pulmonary effort is normal. No respiratory distress.     Breath sounds: Normal breath sounds.  Abdominal:     General: There is no distension.     Palpations: Abdomen is soft.     Tenderness: There is no abdominal tenderness.  Genitourinary:    Comments: Urinary catheter bag with translucent red tinged urine Skin:    General: Skin is warm and dry.  Neurological:     General: No focal deficit present.     Mental Status: He is alert.     ED Results / Procedures / Treatments   Labs (all labs ordered are listed, but only abnormal results are displayed) Labs Reviewed  CBC - Abnormal; Notable for the following components:      Result Value   RDW 17.0 (*)    Platelets 143 (*)    All other components within normal limits  COMPREHENSIVE METABOLIC PANEL  LIPASE, BLOOD  URINALYSIS, W/ REFLEX TO CULTURE (INFECTION SUSPECTED)    EKG EKG Interpretation Date/Time:  Thursday April 11 2023 13:49:12 EST Ventricular Rate:  75 PR Interval:  139 QRS Duration:  90 QT Interval:  393 QTC Calculation: 439 R Axis:   75  Text Interpretation: Sinus rhythm Confirmed by Estanislado Pandy [phone]) on 04/11/2023 2:12:11 PM  Radiology No results found.  Procedures Procedures    Medications Ordered in ED Medications  lactated ringers bolus 1,000 mL (has no administration in time range)    ED Course/ Medical Decision Making/ A&P                                  Medical Decision Making Amount and/or Complexity of Data Reviewed Labs: ordered.   This patient is a 63 y.o. male  who presents to the ED for concern of hypotension.   Differential diagnoses prior to evaluation: The emergent differential diagnosis includes, but is not limited to,  sepsis, orthostatic hypotension, shock. This is not an exhaustive differential.   Past Medical History / Co-morbidities / Social History: pancreatitis, insulin dependent T2DM, HTN, alcohol abuse, HLD, tobacco use, anxiety, depression, PTSD, PAD s/p right BKA, cataracts, diabetic retinopathy, CAD s/p PCI in 2023, CHF (LVEF 45-50%), chronic respiratory failure on 3 L oxygen at night  Additional history: Chart reviewed. Pertinent results include: Reviewed ED visit from 2/14, pt seen for cough, hyperglycemia, hypotension, slurred speech. Pt received MRA which showed occluded left vertebral artery of unclear chronicity, neurology felt he was already being anticoagulated and didn't require admission.   Reviewed urine culture results from 2/14 visit which were positive for Morganellea morganii. Pt was discharged with amoxicillin and azithromycin for respiratory coverage, no additional ABX was recommended after urine culture resulted.   Physical Exam: Physical exam performed. The pertinent findings include: initial BP 91/65, repeat 114/87, otherwise normal vitals. Heart regular rate and rhythm, lung sounds clear. Foley bag with dark/blood tinged urine. Abdomen soft and non-tender.   Lab Tests/Imaging studies: I personally interpreted labs/imaging and the pertinent results include:  CBC without leukocytosis, normal hemoglobin, platelets borderline low. CMP, lipase, and UA with reflex to culture pending.   Cardiac monitoring: EKG obtained and interpreted by myself and attending physician which shows: sinus rhythm   Medications: I ordered medication including IVF.  I have reviewed the patients home medicines and have  made adjustments as needed.  Consultations obtained: I consulted ED pharmacist Lynann Beaver who reviewed prior urine culture reports and recommends if patient  is symptomatic, can treat with Bactrim 1 DS tablet BID for 7-10 days.   Disposition: Patient discussed and care transferred to [facility] at shift change. Please see his/her note for further details regarding further ED course and disposition. Plan at time of handoff is follow up on lab and orthostatic vital signs. Anticipate if BP stays at baseline, patient can dc to home with encouraged PO hydration.    Final Clinical Impression(s) / ED Diagnoses Final diagnoses:  Transient hypotension  Diarrhea, unspecified type  Indwelling urinary catheter present    Rx / DC Orders ED Discharge Orders     None      Portions of this report may have been transcribed using voice recognition software. Every effort was made to ensure accuracy; however, inadvertent computerized transcription errors may be present.    Daneil Beem T, PA-C 04/11/23 1458    Coral Spikes, DO 04/11/23 [phone]

## 2023-04-11 NOTE — ED Notes (Signed)
Meal Given 

## 2023-04-11 NOTE — ED Provider Notes (Signed)
Received signout from previous provider, please see her note for complete H&P.  63 year old male with history of heart failure, CAD, right BKA, brought here with concerns of low blood pressure.  For the past few days he has some diarrhea after recent antibiotic use.  He was noted to have low blood pressure at home when his caregiver came by to check in.  Patient states that he did feel a bit lightheadedness but after receiving IV fluid as well as food and drink here he feels better.  He did have positive orthostatic vital sign and his urine did show signs of urinary tract infection.  Patient preference is to go home and his diarrhea has improved.  His vital signs are now stabilized and I felt patient is stable for discharge.  I gave patient strict return precaution.  Will discharge home with Bactrim for UTI.  Blood pressure 110/70, pulse 78, temperature (!) 97.5 F (36.4 C), temperature source Oral, resp. rate 13, height 5\' 8"  (1.727 m), weight 99.8 kg, SpO2 98%.  Results for orders placed or performed during the hospital encounter of 04/11/23  CBC   Collection Time: 04/11/23  1:51 PM  Result Value Ref Range   WBC 7.6 4.0 - 10.5 K/uL   RBC 4.90 4.22 - 5.81 MIL/uL   Hemoglobin 15.0 13.0 - 17.0 g/dL   HCT 57.8 46.9 - 62.9 %   MCV 95.1 80.0 - 100.0 fL   MCH 30.6 26.0 - 34.0 pg   MCHC 32.2 30.0 - 36.0 g/dL   RDW 52.8 (H) 41.3 - 24.4 %   Platelets 143 (L) 150 - 400 K/uL   nRBC 0.0 0.0 - 0.2 %  Urinalysis, w/ Reflex to Culture (Infection Suspected) -Urine, Catheterized; Indwelling urinary catheter   Collection Time: 04/11/23  2:06 PM  Result Value Ref Range   Specimen Source URINE, CLEAN CATCH    Color, Urine YELLOW YELLOW   APPearance CLOUDY (A) CLEAR   Specific Gravity, Urine 1.021 1.005 - 1.030   pH 5.0 5.0 - 8.0   Glucose, UA >=500 (A) NEGATIVE mg/dL   Hgb urine dipstick LARGE (A) NEGATIVE   Bilirubin Urine NEGATIVE NEGATIVE   Ketones, ur 5 (A) NEGATIVE mg/dL   Protein, ur 30 (A)  NEGATIVE mg/dL   Nitrite NEGATIVE NEGATIVE   Leukocytes,Ua NEGATIVE NEGATIVE   RBC / HPF >50 0 - 5 RBC/hpf   WBC, UA 11-20 0 - 5 WBC/hpf   Bacteria, UA RARE (A) NONE SEEN   Squamous Epithelial / HPF 0-5 0 - 5 /HPF   Mucus PRESENT    Budding Yeast PRESENT    Hyaline Casts, UA PRESENT   Comprehensive metabolic panel   Collection Time: 04/11/23  3:15 PM  Result Value Ref Range   Sodium 137 135 - 145 mmol/L   Potassium 4.1 3.5 - 5.1 mmol/L   Chloride 105 98 - 111 mmol/L   CO2 21 (L) 22 - 32 mmol/L   Glucose, Bld 152 (H) 70 - 99 mg/dL   BUN 19 8 - 23 mg/dL   Creatinine, Ser 0.10 0.61 - 1.24 mg/dL   Calcium 8.9 8.9 - 27.2 mg/dL   Total Protein 7.3 6.5 - 8.1 g/dL   Albumin 3.3 (L) 3.5 - 5.0 g/dL   AST 28 15 - 41 U/L   ALT 38 0 - 44 U/L   Alkaline Phosphatase 63 38 - 126 U/L   Total Bilirubin 1.2 0.0 - 1.2 mg/dL   GFR, Estimated >53 >66 mL/min  Anion gap 11 5 - 15  Lipase, blood   Collection Time: 04/11/23  3:15 PM  Result Value Ref Range   Lipase 24 11 - 51 U/L   MR Angiogram Neck W or Wo Contrast Result Date: 04/06/2023 CLINICAL DATA:  Neuro deficit, acute, stroke suspected; Neuro deficit, acute, stroke suspected Stroke/TIA, determine embolic source EXAM: MRA HEAD WITHOUT CONTRAST MRA NECK WITHOUT AND WITH CONTRAST TECHNIQUE: Multiplanar and multiecho pulse sequences of the neck were obtained without and with intravenous contrast. Angiographic images of the neck were obtained using MRA technique without and with intravenous contrast; Angiographic images of the Circle of Willis were obtained using MRA technique without intravenous contrast. CONTRAST:  10mL GADAVIST GADOBUTROL 1 MMOL/ML IV SOLN COMPARISON:  None Available. FINDINGS: MRA NECK FINDINGS Aortic arch: Great vessel origins are patent. Right carotid system: Atherosclerosis at the carotid bifurcation involving the proximal ICA with approximately 30% narrowing of the ICA origin relative to the distal vessel. Left carotid system:  Patent. No evidence of significant (greater than 50%) stenosis. Vertebral arteries: Right vertebral artery is patent without significant stenosis. Left vertebral artery is occluded at its origin with no flow related signal or enhancement throughout the neck. MRA HEAD FINDINGS Anterior circulation: Bilateral intracranial ICAs, MCAs, and ACAs are patent without proximal hemodynamically significant stenosis. No aneurysm identified. Posterior circulation: Non-opacified left intradural vertebral artery. Right vertebral artery, basilar artery and bilateral posterior cerebral arteries are patent without proximal hemodynamically significant stenosis. The right vertebral artery is tortuous. IMPRESSION: 1. Occluded left vertebral artery. 2. Approximately 30% narrowing of the right ICA origin. Electronically Signed   By: Feliberto Harts M.D.   On: 04/06/2023 01:38   MR ANGIO HEAD WO CONTRAST Result Date: 04/06/2023 CLINICAL DATA:  Neuro deficit, acute, stroke suspected; Neuro deficit, acute, stroke suspected Stroke/TIA, determine embolic source EXAM: MRA HEAD WITHOUT CONTRAST MRA NECK WITHOUT AND WITH CONTRAST TECHNIQUE: Multiplanar and multiecho pulse sequences of the neck were obtained without and with intravenous contrast. Angiographic images of the neck were obtained using MRA technique without and with intravenous contrast; Angiographic images of the Circle of Willis were obtained using MRA technique without intravenous contrast. CONTRAST:  10mL GADAVIST GADOBUTROL 1 MMOL/ML IV SOLN COMPARISON:  None Available. FINDINGS: MRA NECK FINDINGS Aortic arch: Great vessel origins are patent. Right carotid system: Atherosclerosis at the carotid bifurcation involving the proximal ICA with approximately 30% narrowing of the ICA origin relative to the distal vessel. Left carotid system: Patent. No evidence of significant (greater than 50%) stenosis. Vertebral arteries: Right vertebral artery is patent without significant  stenosis. Left vertebral artery is occluded at its origin with no flow related signal or enhancement throughout the neck. MRA HEAD FINDINGS Anterior circulation: Bilateral intracranial ICAs, MCAs, and ACAs are patent without proximal hemodynamically significant stenosis. No aneurysm identified. Posterior circulation: Non-opacified left intradural vertebral artery. Right vertebral artery, basilar artery and bilateral posterior cerebral arteries are patent without proximal hemodynamically significant stenosis. The right vertebral artery is tortuous. IMPRESSION: 1. Occluded left vertebral artery. 2. Approximately 30% narrowing of the right ICA origin. Electronically Signed   By: Feliberto Harts M.D.   On: 04/06/2023 01:38   MR BRAIN WO CONTRAST Result Date: 04/05/2023 CLINICAL DATA:  Provided history: Neuro deficit, acute, stroke suspected. Additional history obtained from electronic MEDICAL RECORD NUMBERHyperglycemia, slurred speech. EXAM: MRI HEAD WITHOUT CONTRAST TECHNIQUE: Multiplanar, multiecho pulse sequences of the brain and surrounding structures were obtained without intravenous contrast. COMPARISON:  Head CT 04/05/2023. FINDINGS: Brain: Mild generalized cerebral  atrophy. Focus of diffusion-weighted signal hyperintensity within the callosal splenium favored to reflect artifact. There is no convincing ADC correlate and the appearance would be atypical for a cytotoxic lesion of the corpus callosum. Elsewhere, there is no evidence of an acute infarct. Chronic lacunar infarcts within the medial thalami, bilaterally. Small chronic infarcts within the bilateral cerebellar hemispheres. Multifocal T2 FLAIR hyperintense signal abnormality within the cerebral white matter (moderate/severe) and pons (severe), nonspecific but compatible chronic small vessel ischemic disease. No evidence of an intracranial mass. No chronic intracranial blood products. No extra-axial fluid collection. No midline shift. Vascular: Signal  abnormality within the left vertebral artery at the V3/V4 junction suggesting high-grade stenosis or vessel occlusion. Flow voids preserved elsewhere within the proximal large arterial vessels. Skull and upper cervical spine: No focal worrisome marrow lesion. Sinuses/Orbits: No mass or acute finding within the imaged orbits. Severe mucosal thickening within the right maxillary sinus. Moderate-sized fluid level within the left maxillary sinus. Mild bilateral ethmoid sinusitis. Trace fluid within the frontal sinuses, bilaterally. IMPRESSION: 1. Focus of diffusion-weighted signal hyperintensity within the callosal splenium favored to reflect artifact. There is no convincing ADC correlate and the appearance would be atypical for a cytotoxic lesion of the corpus callosum. 2. Elsewhere, there is no evidence of an acute infarct. 3. Chronic small vessel ischemic changes within the cerebral white matter (moderate/advanced) and pons (advanced). 4. Small chronic infarcts within the bilateral thalami and cerebellar hemispheres. 5. Mild generalized cerebral atrophy. 6. Signal abnormality within the left vertebral artery at the V3/V4 junction suggesting high-grade stenosis or vessel occlusion. Consider MR or CT angiography for further evaluation. 7. Paranasal sinus disease as described. Electronically Signed   By: Jackey Loge D.O.   On: 04/05/2023 19:15   DG Chest Portable 1 View Result Date: 04/05/2023 CLINICAL DATA:  Cough over the last 3 weeks.  Hypertension. EXAM: PORTABLE CHEST 1 VIEW COMPARISON:  03/17/2023 FINDINGS: Atherosclerotic calcification of the aortic arch. Heart size within normal limits for projection. Lungs appear clear. No blunting of the costophrenic angles. No significant skeletal abnormality observed. IMPRESSION: 1. No acute findings. 2. Aortic Atherosclerosis (ICD10-I70.0). Electronically Signed   By: Gaylyn Rong M.D.   On: 04/05/2023 17:12   CT Head Wo Contrast Result Date:  04/05/2023 CLINICAL DATA:  Neuro deficit, acute, stroke suspected. Altered mental status. EXAM: CT HEAD WITHOUT CONTRAST TECHNIQUE: Contiguous axial images were obtained from the base of the skull through the vertex without intravenous contrast. RADIATION DOSE REDUCTION: This exam was performed according to the departmental dose-optimization program which includes automated exposure control, adjustment of the mA and/or kV according to patient size and/or use of iterative reconstruction technique. COMPARISON:  Head CT 06/19/2022 FINDINGS: Brain: There is no evidence of an acute infarct, intracranial hemorrhage, mass, midline shift, or extra-axial fluid collection. Patchy hypodensities in the cerebral white matter are unchanged and nonspecific but compatible with moderate chronic small vessel ischemic disease. Small chronic infarcts in the right thalamus and left cerebellar hemisphere are unchanged. Mild cerebral atrophy is within normal limits for age. Vascular: Calcified atherosclerosis at the skull base. No hyperdense vessel. Skull: No acute fracture or suspicious osseous lesion. Sinuses/Orbits: Partially visualized fluid in both maxillary sinuses. Clear mastoid air cells. Unremarkable orbits. Other: None. IMPRESSION: 1. No evidence of acute intracranial abnormality. 2. Moderate chronic small vessel ischemic disease. 3. Bilateral maxillary sinusitis. Electronically Signed   By: Sebastian Ache M.D.   On: 04/05/2023 16:57   DG Chest Port 1 View Result Date: 03/17/2023 CLINICAL  DATA:  Questionable sepsis. EXAM: PORTABLE CHEST 1 VIEW COMPARISON:  AP Lat 02/13/2023 FINDINGS: There is a tangle of multiple overlying monitor wires. The heart is slightly enlarged without evidence of CHF. There is a low inspiration. Bronchovascular crowding is seen in the bases but no convincing focal pneumonia. The mediastinum is normally outlined. No pleural effusion is seen. Thoracic spondylosis. IMPRESSION: 1. Low inspiration with  bronchovascular crowding in the bases. No convincing focal pneumonia. 2. Mild cardiomegaly without evidence of CHF. Electronically Signed   By: Almira Bar M.D.   On: 03/17/2023 04:45      Fayrene Helper, PA-C 04/11/23 Jaymes Graff, MD 04/11/23 [phone]

## 2023-04-12 LAB — URINE CULTURE: Culture: >=100000 — AB

## 2023-04-13 ENCOUNTER — Telehealth (HOSPITAL_BASED_OUTPATIENT_CLINIC_OR_DEPARTMENT_OTHER): Payer: Self-pay | Admitting: *Deleted

## 2023-04-13 NOTE — Telephone Encounter (Signed)
 Post ED Visit - Positive Culture Follow-up: Unsuccessful Patient Follow-up  Culture assessed and recommendations reviewed by:  [x]  Daylene Posey, Pharm.D. []  Celedonio Miyamoto, Pharm.D., BCPS AQ-ID []  Garvin Fila, Pharm.D., BCPS []  Georgina Pillion, Pharm.D., BCPS []  [place], [address].D., BCPS, AAHIVP []  Estella Husk, Pharm.D., BCPS, AAHIVP []  Sherlynn Carbon, PharmD []  Pollyann Samples, PharmD, BCPS  Positive urine culture  []  Patient discharged without antimicrobial prescription and treatment is now indicated [x]  Organism is resistant to prescribed ED discharge antimicrobial []  Patient with positive blood cultures  Plan: Fluconazole 200mg  PO daily x 14 days D/C Bactrim per Melina Modena, PA-C  Unable to contact patient , letter will be sent to address on file  Patsey Berthold 04/13/2023, 4:24 PM

## 2023-04-14 NOTE — Telephone Encounter (Signed)
 Post ED Visit - Positive Culture Follow-up: Successful Patient Follow-Up  Culture assessed and recommendations reviewed by:  [x]  Antionette Poles, Pharm.D. []  Celedonio Miyamoto, Pharm.D., BCPS AQ-ID []  Garvin Fila, Pharm.D., BCPS []  Georgina Pillion, Pharm.D., BCPS []  [place], [address].D., BCPS, AAHIVP []  Estella Husk, Pharm.D., BCPS, AAHIVP []  Lysle Pearl, PharmD, BCPS []  Phillips Climes, PharmD, BCPS []  Agapito Games, PharmD, BCPS []  Verlan Friends, PharmD  Positive urine culture  Changes discussed with ED provider: Melina Modena, PA-C New  prescription Fluconazole 200mg  PO daily x 14 days D/c Bactrim Called to [facility] Dr. Ginette Otto  Contacted patient, date 04/14/23, time 0950   Patsey Berthold 04/14/2023, 9:48 AM

## 2023-04-15 DIAGNOSIS — E782 Mixed hyperlipidemia: Secondary | ICD-10-CM | POA: Diagnosis not present

## 2023-04-15 DIAGNOSIS — Z794 Long term (current) use of insulin: Secondary | ICD-10-CM | POA: Diagnosis not present

## 2023-04-15 DIAGNOSIS — Z0000 Encounter for general adult medical examination without abnormal findings: Secondary | ICD-10-CM | POA: Diagnosis not present

## 2023-04-15 DIAGNOSIS — I5042 Chronic combined systolic (congestive) and diastolic (congestive) heart failure: Secondary | ICD-10-CM | POA: Diagnosis not present

## 2023-04-15 DIAGNOSIS — I5020 Unspecified systolic (congestive) heart failure: Secondary | ICD-10-CM | POA: Diagnosis not present

## 2023-04-15 DIAGNOSIS — E1140 Type 2 diabetes mellitus with diabetic neuropathy, unspecified: Secondary | ICD-10-CM | POA: Diagnosis not present

## 2023-04-15 DIAGNOSIS — I10 Essential (primary) hypertension: Secondary | ICD-10-CM | POA: Diagnosis not present

## 2023-04-15 DIAGNOSIS — I739 Peripheral vascular disease, unspecified: Secondary | ICD-10-CM | POA: Diagnosis not present

## 2023-04-15 DIAGNOSIS — I2510 Atherosclerotic heart disease of native coronary artery without angina pectoris: Secondary | ICD-10-CM | POA: Diagnosis not present

## 2023-04-15 DIAGNOSIS — E1142 Type 2 diabetes mellitus with diabetic polyneuropathy: Secondary | ICD-10-CM | POA: Diagnosis not present

## 2023-04-15 DIAGNOSIS — J9611 Chronic respiratory failure with hypoxia: Secondary | ICD-10-CM | POA: Diagnosis not present

## 2023-04-16 ENCOUNTER — Other Ambulatory Visit: Payer: Self-pay

## 2023-04-16 ENCOUNTER — Emergency Department (HOSPITAL_BASED_OUTPATIENT_CLINIC_OR_DEPARTMENT_OTHER)
Admission: EM | Admit: 2023-04-16 | Discharge: 2023-04-16 | Disposition: A | Discharge status: Home / Self Care | Payer: No Typology Code available for payment source | Attending: Emergency Medicine | Admitting: Emergency Medicine

## 2023-04-16 ENCOUNTER — Encounter (HOSPITAL_BASED_OUTPATIENT_CLINIC_OR_DEPARTMENT_OTHER): Payer: Self-pay

## 2023-04-16 DIAGNOSIS — Z794 Long term (current) use of insulin: Secondary | ICD-10-CM | POA: Insufficient documentation

## 2023-04-16 DIAGNOSIS — Z7902 Long term (current) use of antithrombotics/antiplatelets: Secondary | ICD-10-CM | POA: Insufficient documentation

## 2023-04-16 DIAGNOSIS — I110 Hypertensive heart disease with heart failure: Secondary | ICD-10-CM | POA: Diagnosis not present

## 2023-04-16 DIAGNOSIS — Z79899 Other long term (current) drug therapy: Secondary | ICD-10-CM | POA: Insufficient documentation

## 2023-04-16 DIAGNOSIS — E1165 Type 2 diabetes mellitus with hyperglycemia: Secondary | ICD-10-CM | POA: Diagnosis not present

## 2023-04-16 DIAGNOSIS — R339 Retention of urine, unspecified: Secondary | ICD-10-CM | POA: Diagnosis present

## 2023-04-16 DIAGNOSIS — Z7982 Long term (current) use of aspirin: Secondary | ICD-10-CM | POA: Insufficient documentation

## 2023-04-16 DIAGNOSIS — Y846 Urinary catheterization as the cause of abnormal reaction of the patient, or of later complication, without mention of misadventure at the time of the procedure: Secondary | ICD-10-CM | POA: Insufficient documentation

## 2023-04-16 DIAGNOSIS — I509 Heart failure, unspecified: Secondary | ICD-10-CM | POA: Insufficient documentation

## 2023-04-16 DIAGNOSIS — T83091A Other mechanical complication of indwelling urethral catheter, initial encounter: Secondary | ICD-10-CM | POA: Insufficient documentation

## 2023-04-16 DIAGNOSIS — R0902 Hypoxemia: Secondary | ICD-10-CM | POA: Diagnosis not present

## 2023-04-16 DIAGNOSIS — I10 Essential (primary) hypertension: Secondary | ICD-10-CM | POA: Diagnosis not present

## 2023-04-16 LAB — URINALYSIS, ROUTINE W REFLEX MICROSCOPIC
Bacteria, UA: NONE SEEN
Bilirubin Urine: NEGATIVE
Glucose, UA: >1000 mg/dL — AB
Ketones, ur: 15 mg/dL — AB
Nitrite: NEGATIVE
Protein, ur: NEGATIVE mg/dL
RBC / HPF: >50 RBC/hpf (ref 0–5)
Specific Gravity, Urine: 1.018 (ref 1.005–1.030)
WBC, UA: >50 WBC/hpf (ref 0–5)
pH: 5.5 (ref 5.0–8.0)

## 2023-04-16 LAB — BASIC METABOLIC PANEL
Anion gap: 13 (ref 5–15)
BUN: 19 mg/dL (ref 8–23)
CO2: 19 mmol/L — ABNORMAL LOW (ref 22–32)
Calcium: 9.8 mg/dL (ref 8.9–10.3)
Chloride: 103 mmol/L (ref 98–111)
Creatinine, Ser: 1.15 mg/dL (ref 0.61–1.24)
GFR, Estimated: >60 mL/min (ref >60)
Glucose, Bld: 226 mg/dL — ABNORMAL HIGH (ref 70–99)
Potassium: 4.8 mmol/L (ref 3.5–5.1)
Sodium: 135 mmol/L (ref 135–145)

## 2023-04-16 NOTE — ED Triage Notes (Signed)
 Pt BIB [facility] d/t decreased UOP - had Foley cath placed 3 weeks ago.

## 2023-04-16 NOTE — Discharge Instructions (Addendum)
 Please follow-up with your urologist.  Your Foley catheter was exchanged in the emergency department.

## 2023-04-16 NOTE — ED Provider Notes (Signed)
 [place] EMERGENCY DEPARTMENT AT [facility] Provider Note   CSN: [id number] Arrival date & time: 04/16/23  2112     History {Add pertinent medical, surgical, social history, OB history to HPI:1} Chief Complaint  Patient presents with   Urinary Retention    Ricardo Howard is a 63 y.o. male.  HPI     Home Medications Prior to Admission medications   Medication Sig Start Date End Date Taking? Authorizing Provider  acetaminophen (TYLENOL) 325 MG tablet Take 2 tablets (650 mg total) by mouth every 6 (six) hours as needed for mild pain (or Fever >/= 101). 06/20/22   Elgergawy, Leana Roe, MD  albuterol (VENTOLIN HFA) 108 (90 Base) MCG/ACT inhaler Inhale 2 puffs into the lungs every 6 (six) hours as needed for wheezing or shortness of breath. 03/21/23   Ghimire, Werner Lean, MD  aspirin EC 81 MG tablet Take 81 mg by mouth in the morning.    [provider]  atorvastatin (LIPITOR) 80 MG tablet Take 1 tablet (80 mg total) by mouth at bedtime. 08/30/21   Wendall Stade, MD  budesonide-formoterol (SYMBICORT) 160-4.5 MCG/ACT inhaler Inhale 2 puffs into the lungs 2 (two) times daily. 03/21/23   Ghimire, Werner Lean, MD  buPROPion (WELLBUTRIN SR) 150 MG 12 hr tablet Take 150 mg by mouth 2 (two) times daily.    [provider]  clopidogrel (PLAVIX) 75 MG tablet Take 75 mg by mouth in the morning.    [provider]  empagliflozin (JARDIANCE) 25 MG TABS tablet Take 1 tablet (25 mg total) by mouth daily. Patient taking differently: Take 12.5 mg by mouth daily. 08/30/21   Wendall Stade, MD  finasteride (PROSCAR) 5 MG tablet Take 5 mg by mouth in the morning.    [provider]  folic acid (FOLVITE) 1 MG tablet Take 1 tablet (1 mg total) by mouth daily. Patient taking differently: Take 1 mg by mouth in the morning. 10/11/19   Rodolph Bong, MD  furosemide (LASIX) 40 MG tablet Take 1 tablet (40 mg total) by mouth daily. Patient taking differently: Take  40 mg by mouth in the morning. Take 1 tablet (40 mg total) by mouth daily. 06/23/22   Elgergawy, Leana Roe, MD  gabapentin (NEURONTIN) 300 MG capsule Take 300 mg by mouth 3 (three) times daily. Take 1 capsule (300 mg) by mouth in the morning, take 1 capsule (300 mg) by mouth at lunch, & take 2 capsules (600 mg) by mouth at bedtime    [provider]  guaiFENesin (MUCINEX) 600 MG 12 hr tablet Take 1 tablet (600 mg total) by mouth 2 (two) times daily. 02/14/23   Osvaldo Shipper, MD  insulin aspart (NOVOLOG) 100 UNIT/ML injection Inject 10 Units into the skin 3 (three) times daily with meals. 02/14/23   Osvaldo Shipper, MD  insulin glargine (LANTUS) 100 UNIT/ML Solostar Pen 30units in am and 35unit at night  Further adjustment per your pcp and endocrinology Goal of a1c is less than 7%, currently your a1c is 10%, not at goal Patient taking differently: Inject 65 Units into the skin daily. Further adjustment per your pcp and endocrinology Goal of a1c is less than 7%, currently your a1c is 10%, not at goal 06/03/21   Albertine Grates, MD  Insulin Pen Needle (NOVOFINE) 30G X 8 MM MISC Inject 10 each into the skin as needed. 10/10/19   Rodolph Bong, MD  metoprolol tartrate (LOPRESSOR) 100 MG tablet Take 50 mg by mouth  in the morning.    [provider]  Multiple Vitamin (QUINTABS) TABS Take 1 tablet by mouth daily.    [provider]  naproxen sodium (ALEVE) 220 MG tablet Take 220 mg by mouth daily as needed (headache). Patient not taking: Reported on 03/17/2023    [provider]  nitroGLYCERIN (NITROSTAT) 0.4 MG SL tablet Place 1 tablet (0.4 mg total) under the tongue every 5 (five) minutes as needed for chest pain. 07/19/21   Evlyn Kanner, MD  potassium chloride SA (KLOR-CON M) 20 MEQ tablet Take 1 tablet (20 mEq total) by mouth daily. Patient taking differently: Take 20 mEq by mouth daily after lunch. 08/31/21   Bhagat, Sharrell Ku, PA  predniSONE (DELTASONE) 10 MG tablet  Take 40 mg daily for 1 day, 30 mg daily for 1 day, 20 mg daily for 1 days,10 mg daily for 1 day, then stop 03/21/23   Maretta Bees, MD  ranolazine (RANEXA) 500 MG 12 hr tablet Take 500 mg by mouth 2 (two) times daily. 09/27/21   [provider]  sacubitril-valsartan (ENTRESTO) 24-26 MG Take 1 tablet by mouth 2 (two) times daily. 06/25/22   Elgergawy, Leana Roe, MD  sertraline (ZOLOFT) 100 MG tablet Take 150 mg by mouth daily.    [provider]  sulfamethoxazole-trimethoprim (BACTRIM DS) 800-160 MG tablet Take 1 tablet by mouth 2 (two) times daily for 5 days. 04/11/23 04/16/23  Fayrene Helper, PA-C  tamsulosin (FLOMAX) 0.4 MG CAPS capsule Take 0.4 mg by mouth daily.    [provider]  thiamine (VITAMIN B-1) 100 MG tablet Take 1 tablet (100 mg total) by mouth daily. 02/15/23   Osvaldo Shipper, MD      Allergies    Iohexol, Ozempic (0.25 or 0.5 mg-dose) [semaglutide(0.25 or 0.5mg -dos)], Shellfish allergy, Milk (cow), Zestril [lisinopril], and Zocor [simvastatin]    Review of Systems   Review of Systems  Physical Exam Updated Vital Signs BP (!) 131/119   Pulse 88   Temp 98.1 F (36.7 C) (Oral)   Resp (!) 24   Ht 5\' 8"  (1.727 m)   Wt 88.5 kg   SpO2 95%   BMI 29.65 kg/m  Physical Exam  ED Results / Procedures / Treatments   Labs (all labs ordered are listed, but only abnormal results are displayed) Labs Reviewed - No data to display  EKG None  Radiology No results found.  Procedures Procedures  {Document cardiac monitor, telemetry assessment procedure when appropriate:1}  Medications Ordered in ED Medications - No data to display  ED Course/ Medical Decision Making/ A&P   {   Click here for ABCD2, HEART and other calculatorsREFRESH Note before signing :1}                              Medical Decision Making  ***  {Document critical care time when appropriate:1} {Document review of labs and clinical decision tools ie heart score, Chads2Vasc2  etc:1}  {Document your independent review of radiology images, and any outside records:1} {Document your discussion with family members, caretakers, and with consultants:1} {Document social determinants of health affecting pt's care:1} {Document your decision making why or why not admission, treatments were needed:1} Final Clinical Impression(s) / ED Diagnoses Final diagnoses:  None    Rx / DC Orders ED Discharge Orders     None

## 2023-04-19 ENCOUNTER — Encounter: Payer: Self-pay | Admitting: *Deleted

## 2023-04-19 DIAGNOSIS — R338 Other retention of urine: Secondary | ICD-10-CM | POA: Diagnosis not present

## 2023-04-24 NOTE — Progress Notes (Deleted)
 Cardiology Office Note:    Date:  04/24/2023   ID:  Ricardo Howard, DOB 14-Jan-1961, MRN [id number]  PCP:  [facility]  [facility] Cardiologist:  Charlton Haws, MD  [facility] Electrophysiologist:  None   Chief Complaint: medication management   History of Present Illness:    Ricardo Howard is a 63 y.o. male with a hx of CAD, hypertension, hyperlipidemia, diabetes, peripheral vascular disease s/p right BKA, obstructive sleep apnea on CPAP, chronic combined CHF, alcohol and tobacco use seen for close follow-up.  Admitted 06/2021 for NSTEMI. Cath showed High-grade calcified proximal right coronary artery lesion treated with atherectomy and 1 drug-eluting stent with patent previously placed mid to distal right coronary artery stent and proximal left circumflex stent, the latter with moderate in-stent restenosis. Placed on DAPT with Brilinta/ASA indefinitely give the length of stent and number. Does have moderate disease in the pLAD and LCx to be treated medically.  LVEF of 45-50% with septal hypokinesis mid/basal wall akinesis.  He missed post hospital follow-up appointment. Has issues with med compliance and refills   Brillinta changed to Plavix for dyspnea   BS poorly controlled and [facility] left   ***  Past Medical History:  Diagnosis Date   Alcohol abuse 06/14/2019   Anxiety    Back pain    Cataracts, bilateral    CHF (congestive heart failure) (HCC)    Depression    Diabetic retinopathy (HCC)    Essential hypertension 06/14/2019   Hepatitis C    cured   Hx of BKA, right (HCC)    Hyperlipidemia    Hypertension    Mixed hyperlipidemia due to type 2 diabetes mellitus (HCC) 07/31/2019   Nicotine dependence, cigarettes, uncomplicated 07/31/2019   PAD (peripheral artery disease) (HCC)    Pancreatitis    PTSD (post-traumatic stress disorder)    Uncontrolled type 2 diabetes mellitus with diabetic polyneuropathy, with long-term current use of  insulin 07/31/2019    Past Surgical History:  Procedure Laterality Date   ABDOMINAL AORTOGRAM W/LOWER EXTREMITY N/A 12/02/2020   Procedure: ABDOMINAL AORTOGRAM W/LOWER EXTREMITY;  Surgeon: Leonie Douglas, MD;  Location: [facility] INVASIVE CV LAB;  Service: Cardiovascular;  Laterality: N/A;   AMPUTATION Right 03/13/2020   Procedure: TRANSMETATARSAL AMPUTATION;  Surgeon: Chuck Hint, MD;  Location: [facility] OR;  Service: Vascular;  Laterality: Right;   AMPUTATION Right 03/15/2020   Procedure: RIGHT BELOW KNEE AMPUTATION;  Surgeon: Chuck Hint, MD;  Location: [facility] OR;  Service: Vascular;  Laterality: Right;   CORONARY ANGIOPLASTY WITH STENT PLACEMENT     CORONARY STENT INTERVENTION N/A 07/16/2021   Procedure: CORONARY STENT INTERVENTION;  Surgeon: Orbie Pyo, MD;  Location: [facility] INVASIVE CV LAB;  Service: Cardiovascular;  Laterality: N/A;   LEFT HEART CATH AND CORONARY ANGIOGRAPHY N/A 07/16/2021   Procedure: LEFT HEART CATH AND CORONARY ANGIOGRAPHY;  Surgeon: Orbie Pyo, MD;  Location: [facility] INVAS[facility] CV LAB;  Service: Cardiovascular;  Laterality: N/A;   PERIPHERAL VASCULAR INTERVENTION  12/02/2020   Procedure: PERIPHERAL VASCULAR INTERVENTION;  Surgeon: Leonie Douglas, MD;  Location: [facility]VAS[facility];  Service: Cardiovascular;;  Lt SFA, LEIA    Current Medications: No outpatient medications have been marked as taking for the 04/29/23 encounter (Appointment) with Wendall Stade, MD.     Allergies:   Iohexol, Ozempic (0.25 or 0.5 mg-dose) [semaglutide(0.25 or 0.5mg -dos)], Shellfish allergy, Milk (cow), Zestril [lisinopril], and Zocor [simvastatin]   Social History   Socioeconomic History  Marital status: Married    Spouse name: Not on file   Number of children: Not on file   Years of education: Not on file   Highest education level: Not on file  Occupational History   Occupation: Retired  Tobacco Use   Smoking status: Every Day    Current packs/day: 0.25    Average  packs/day: 0.3 packs/day for 52.2 years (13.0 ttl pk-yrs)    Types: Cigarettes    Start date: 1973   Smokeless tobacco: Never  Vaping Use   Vaping status: Never Used  Substance and Sexual Activity   Alcohol use: Yes    Alcohol/week: 3.0 standard drinks of alcohol    Types: 3 Cans of beer per week    Comment: etoh abuse   Drug use: Not Currently   Sexual activity: Not on file  Other Topics Concern   Not on file  Social History Narrative   retired   [employer]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 Strain: Not on file  Food Insecurity: No Food Insecurity (03/18/2023)   Hunger Vital Sign    Worried About Running Out of Food in the Last Year: Never true    Ran Out of Food in the Last Year: Never true  Transportation Needs: No Transportation Needs (03/18/2023)   PRAPARE - Administrator, Civil Service (Medical): No    Lack of Transportation (Non-Medical): No  Recent Concern: Transportation Needs - Unmet Transportation Needs (03/18/2023)   PRAPARE - Administrator, Civil Service (Medical): Yes    Lack of Transportation (Non-Medical): Yes  Physical Activity: Not on file  Stress: Not on file  Social Connections: Unknown (07/02/2021)   Received from [facility]   Social Network    Social Network: Not on file     Family History: The patient's family history is negative for Other.    ROS:   Please see the history of present illness.    All other systems reviewed and are negative.   EKGs/Labs/Other Studies Reviewed:    The following studies were reviewed today:  Echo 07/16/21:   1. Septal hypokinesis mid/basal wall akinesis . Left ventricular ejection  fraction, by estimation, is 45 to 50%. The left ventricle has mildly  decreased function. The left ventricle demonstrates regional wall motion  abnormalities (see scoring  diagram/findings for description). The left ventricular internal cavity  size was mildly dilated. There is mild left  ventricular hypertrophy.   2. Right ventricular systolic function is normal. The right ventricular  size is normal.   3. The mitral valve is normal in structure. No evidence of mitral valve  regurgitation.   4. The aortic valve is tricuspid. There is moderate calcification of the  aortic valve. There is mild thickening of the aortic valve. Aortic valve  sclerosis/calcification is present, without any evidence of aortic  stenosis.    Cardiac cath 07/16/21:      Mid RCA lesion is 20% stenosed.   Prox RCA lesion is 95% stenosed.   Prox Cx lesion is 40% stenosed.   Mid LAD lesion is 50% stenosed.   A stent was successfully placed.   Post intervention, there is a 0% residual stenosis.   LV end diastolic pressure is mildly elevated.   1.  High-grade calcified proximal right coronary artery lesion treated with atherectomy and 1 drug-eluting stent with patent previously placed mid to distal right coronary artery stent and proximal left circumflex stent, the latter with moderate in-stent  restenosis. 2.  Very focal mid moderate LAD lesion. 3.  LVEDP of 19 mmHg.   Recommendation: Dual antiplatelet therapy for 1 year.   Dominance: Right Intervention         EKG:  EKG is  ordered today.  The ekg ordered today demonstrates sinus tachycardia  Recent Labs: 06/20/2022: TSH 2.569 03/21/2023: Magnesium 1.7 04/11/2023: ALT 38; Hemoglobin 15.0; Platelets 143 04/16/2023: BUN 19; Creatinine, Ser 1.15; Potassium 4.8; Sodium 135  Recent Lipid Panel    Component Value Date/Time   CHOL 249 (H) 07/16/2021 1448   TRIG 58 07/16/2021 1448   HDL 44 07/16/2021 1448   CHOLHDL 5.7 07/16/2021 1448   VLDL 12 07/16/2021 1448   LDLCALC 193 (H) 07/16/2021 1448     Physical Exam:    VS:  There were no vitals taken for this visit.   Repeat BP 100/63 mm Hg   Wt Readings from Last 3 Encounters:  04/16/23 195 lb (88.5 kg)  04/11/23 220 lb (99.8 kg)  04/05/23 230 lb (104.3 kg)     GEN:  Well nourished,  well developed in no acute distress HEENT: Normal NECK: No JVD; No carotid bruits LYMPHATICS: No lymphadenopathy CARDIAC: Regular tachycardic, no murmurs, rubs, gallops RESPIRATORY: Diminished breath sounds with rales  ABDOMEN: Soft, non-tender, + distended MUSCULOSKELETAL: Right BKA, 1+ lower extremity edema on left side  SKIN: Warm and dry NEUROLOGIC:  Alert and oriented x 3 PSYCHIATRIC:  Normal affect   ASSESSMENT AND PLAN:    CAD - cath 06/2021 High-grade calcified proximal right coronary artery lesion treated with atherectomy and 1 drug-eluting stent with patent previously placed mid to distal right coronary artery stent and proximal left circumflex stent, the latter with moderate in-stent restenosis -Continue aspirin -He will continue Plav[facility] which [facility] (stop Brilinta sample) -Continue Lipitor and Zetia - Continue Imdur 30 mg daily -Continue carvedilol 6.25 mg daily - Note contrast allergy for future reference  2. ICM/acute on chronic combined heart failure - Echo with LVEF of 45-50% with septal hypokinesis mid/basal wall akinesis - Update TTE - Lasix 40 mg daily, entresto 24/26 mg bid, Toprol 100 mg daily  3. HTN - at goal continue above CHF meds  4. HLD -Continue Zetia and lipitor  5. PAD s/p RBKA  6. OSA on CPAP  7. Tobacco/Alcohol use  8.  Sinus tachycardia improved on lopressor   9.  DM:  major problem will refer to local endocrine for better control as [facility] Lafe Garin [facility]   Medication Adjustments/Labs and Tests Ordered: Current medicines are reviewed at length with the patient today.  Concerns regarding medicines are outlined above.  No orders of the defined types were placed in this encounter.  No orders of the defined types were placed in this encounter.   There are no Patient Instructions on file for this visit.   Signed, Charlton Haws, MD  04/24/2023 4:17 PM    [facility]

## 2023-04-26 ENCOUNTER — Encounter: Payer: Self-pay | Admitting: Neurology

## 2023-04-26 ENCOUNTER — Ambulatory Visit: Payer: 59 | Admitting: Neurology

## 2023-04-26 VITALS — BP 110/64 | Ht 68.0 in | Wt 204.0 lb

## 2023-04-26 DIAGNOSIS — G459 Transient cerebral ischemic attack, unspecified: Secondary | ICD-10-CM

## 2023-04-26 NOTE — Progress Notes (Signed)
 Chief Complaint  Patient presents with   New Patient (Initial Visit)    Rm 13, with wife Tacey Ruiz, NP for slurred speech, chronic infarcts      ASSESSMENT AND PLAN  Purl Claytor is a 63 y.o. male   Cerebrovascular disease  Multiple vascular risk factor, poorly controlled insulin-dependent diabetes, most recent A1c was 10.9, hypertension hyperlipidemia, coronary artery disease, peripheral vascular disease, moderate alcohol use  MRI of the brain showed extensive small vessel disease, lacunar infarction, pontine small vessel disease,  Continue aspirin 81+ Plavix 75 mg daily  Emphasized importance of optimize vascular risk factor control, A1c less than 7.0  Complete evaluation with echocardiogram  DIAGNOSTIC DATA (LABS, IMAGING, TESTING) - I reviewed patient records, labs, notes, testing and imaging myself where available.   MEDICAL HISTORY:  Sylvester Minton is a 63 year old male accompanied by his wife, seen in request by his primary care doctor Kennis Carina from [facility] for evaluation of possible stroke, initial evaluation was on April 26, 2023  History is obtained from the patient and review of electronic medical records. I personally reviewed pertinent available imaging films in PACS.   PMHx of  DM-insulin dependent Depression, anxiety Prostate hypertrophy HTN HLD Alcohol use Right BKA in 2022  CAD PVD  He had a multiple vascular risk factor, including coronary artery disease, peripheral vascular disease, status post right BKA, due to poorly controlled diabetes, insulin-dependent, hypertension hyperlipidemia, long-term moderate alcohol use  In February 2025, he suffered frequent UTI, urinary retention, required Foley catheter placement  Around April 05, 2023, after he recovered from all those ordeal, he was noted to have right facial droop, concerning for stroke, he was evaluated at the emergency room  Personally reviewed MRI of the brain April 05, 2023, no acute abnormality, but expensive small vessel disease within periventricular white matter and pons, chronic infarction within bilateral thalami and cerebellar hemisphere generalized atrophy  MRA of head and neck showed occluded left vertebral artery, 30% stenosis of right internal carotid artery  He remained on his aspirin 81+ Plavix 75 mg daily, now he is almost back to his baseline,  Laboratory evaluation showed negative lipase, CMP showed normal creatinine, elevated glucose, albumin was low 3.3, CBC showed normal hemoglobin of 15, negative alcohol, ammonia, UDS, HIV, CPK, A1c was 10.9 on February 14, 2023,  Echocardiogram in May 2023 showed wall regional motion abnormality with mildly decreased ejection fraction 45 to 50%  PHYSICAL EXAM:   Vitals:   04/26/23 1116  BP: 110/64  Weight: 204 lb (92.5 kg)  Height: 5\' 8"  (1.727 m)   Not recorded     Body mass index is 31.02 kg/m.  PHYSICAL EXAMNIATION:  Gen: NAD, conversant, well nourised, well groomed                     Cardiovascular: Regular rate rhythm, no peripheral edema, warm, nontender. Eyes: Conjunctivae clear without exudates or hemorrhage Neck: Supple, no carotid bruits. Pulmonary: Clear to auscultation bilaterally   NEUROLOGICAL EXAM:  MENTAL STATUS: Speech/cognition: Awake, alert, oriented to history taking and casual conversation CRANIAL NERVES: CN II: Visual fields are full to confrontation. Pupils are round equal and briskly reactive to light. CN III, IV, VI: extraocular movement are normal. No ptosis. CN V: Facial sensation is intact to light touch CN VII: Face is symmetric with normal eye closure  CN VIII: Hearing is normal to causal conversation. CN IX, X: Phonation is normal. CN XI: Head turning and shoulder  shrug are intact  MOTOR: Right BKA, no significant muscle weakness otherwise  REFLEXES: Reflexes are absent and symmetric at the biceps, triceps, knees, and ankles. Plantar responses  are flexor.  SENSORY: Length-dependent decreased light touch, pinprick, vibratory sensation to left knee level  COORDINATION: There is no trunk or limb dysmetria noted.  GAIT/STANCE: Push-up from seated position, right prosthetic, mildly unsteady  REVIEW OF SYSTEMS:  Full 14 system review of systems performed and notable only for as above All other review of systems were negative.   ALLERGIES: Allergies  Allergen Reactions   Iohexol Hives    Patient broke out in hives after injection of Omni 300, will need 13 hour pre-med in future   Ozempic (0.25 Or 0.5 Mg-Dose) [Semaglutide(0.25 Or 0.5mg -Dos)] Other (See Comments)    pancreatitis   Shellfish Allergy Hives   Milk (Cow) Diarrhea    Finding of gastrointestinal tract gas   Zestril [Lisinopril] Cough   Zocor [Simvastatin] Itching and Cough    HOME MEDICATIONS: Current Outpatient Medications  Medication Sig Dispense Refill   acetaminophen (TYLENOL) 325 MG tablet Take 2 tablets (650 mg total) by mouth every 6 (six) hours as needed for mild pain (or Fever >/= 101).     albuterol (VENTOLIN HFA) 108 (90 Base) MCG/ACT inhaler Inhale 2 puffs into the lungs every 6 (six) hours as needed for wheezing or shortness of breath. 6.7 g 1   aspirin EC 81 MG tablet Take 81 mg by mouth in the morning.     [Paused] atorvastatin (LIPITOR) 80 MG tablet Take 1 tablet (80 mg total) by mouth at bedtime. 90 tablet 3   budesonide-formoterol (SYMBICORT) 160-4.5 MCG/ACT inhaler Inhale 2 puffs into the lungs 2 (two) times daily. 10.2 each 1   buPROPion (WELLBUTRIN SR) 150 MG 12 hr tablet Take 150 mg by mouth 2 (two) times daily.     clopidogrel (PLAVIX) 75 MG tablet Take 75 mg by mouth in the morning.     empagliflozin (JARDIANCE) 25 MG TABS tablet Take 1 tablet (25 mg total) by mouth daily. (Patient taking differently: Take 12.5 mg by mouth daily.) 90 tablet 3   finasteride (PROSCAR) 5 MG tablet Take 5 mg by mouth in the morning.     folic acid (FOLVITE) 1  MG tablet Take 1 tablet (1 mg total) by mouth daily. (Patient taking differently: Take 1 mg by mouth in the morning.)     furosemide (LASIX) 40 MG tablet Take 1 tablet (40 mg total) by mouth daily. (Patient taking differently: Take 40 mg by mouth in the morning. Take 1 tablet (40 mg total) by mouth daily.) 94 tablet 0   gabapentin (NEURONTIN) 300 MG capsule Take 300 mg by mouth 3 (three) times daily. Take 1 capsule (300 mg) by mouth in the morning, take 1 capsule (300 mg) by mouth at lunch, & take 2 capsules (600 mg) by mouth at bedtime     guaiFENesin (MUCINEX) 600 MG 12 hr tablet Take 1 tablet (600 mg total) by mouth 2 (two) times daily. 60 tablet 0   insulin aspart (NOVOLOG) 100 UNIT/ML injection Inject 10 Units into the skin 3 (three) times daily with meals.     insulin glargine (LANTUS) 100 UNIT/ML Solostar Pen 30units in am and 35unit at night  Further adjustment per your pcp and endocrinology Goal of a1c is less than 7%, currently your a1c is 10%, not at goal (Patient taking differently: Inject 65 Units into the skin daily. Further adjustment per your pcp  and endocrinology Goal of a1c is less than 7%, currently your a1c is 10%, not at goal) 15 mL 0   Insulin Pen Needle (NOVOFINE) 30G X 8 MM MISC Inject 10 each into the skin as needed. 100 each 0   metoprolol tartrate (LOPRESSOR) 100 MG tablet Take 50 mg by mouth in the morning.     Multiple Vitamin (QUINTABS) TABS Take 1 tablet by mouth daily.     naproxen sodium (ALEVE) 220 MG tablet Take 220 mg by mouth daily as needed (headache).     nitroGLYCERIN (NITROSTAT) 0.4 MG SL tablet Place 1 tablet (0.4 mg total) under the tongue every 5 (five) minutes as needed for chest pain. 30 tablet 0   potassium chloride SA (KLOR-CON M) 20 MEQ tablet Take 1 tablet (20 mEq total) by mouth daily. (Patient taking differently: Take 20 mEq by mouth daily after lunch.) 90 tablet 1   predniSONE (DELTASONE) 10 MG tablet Take 40 mg daily for 1 day, 30 mg daily for 1  day, 20 mg daily for 1 days,10 mg daily for 1 day, then stop 10 tablet 0   ranolazine (RANEXA) 500 MG 12 hr tablet Take 500 mg by mouth 2 (two) times daily.     sacubitril-valsartan (ENTRESTO) 24-26 MG Take 1 tablet by mouth 2 (two) times daily. 60 tablet 0   sertraline (ZOLOFT) 100 MG tablet Take 150 mg by mouth daily.     tamsulosin (FLOMAX) 0.4 MG CAPS capsule Take 0.4 mg by mouth daily.     thiamine (VITAMIN B-1) 100 MG tablet Take 1 tablet (100 mg total) by mouth daily. 30 tablet 0   No current facility-administered medications for this visit.    PAST MEDICAL HISTORY: Past Medical History:  Diagnosis Date   Alcohol abuse 06/14/2019   Anxiety    Back pain    Cataracts, bilateral    CHF (congestive heart failure) (HCC)    Depression    Diabetic retinopathy (HCC)    Essential hypertension 06/14/2019   Hepatitis C    cured   Hx of BKA, right (HCC)    Hyperlipidemia    Hypertension    Mixed hyperlipidemia due to type 2 diabetes mellitus (HCC) 07/31/2019   Nicotine dependence, cigarettes, uncomplicated 07/31/2019   PAD (peripheral artery disease) (HCC)    Pancreatitis    PTSD (post-traumatic stress disorder)    Uncontrolled type 2 diabetes mellitus with diabetic polyneuropathy, with long-term current use of insulin 07/31/2019    PAST SURGICAL HISTORY: Past Surgical History:  Procedure Laterality Date   ABDOMINAL AORTOGRAM W/LOWER EXTREMITY N/A 12/02/2020   Procedure: ABDOMINAL AORTOGRAM W/LOWER EXTREMITY;  Surgeon: Leonie Douglas, MD;  Location: [facility];  Service: Cardiovascular;  Laterality: N/A;   AMPUTATION Right 03/13/2020   Procedure: TRANSMETATARSAL AMPUTATION;  Surgeon: Chuck Hint, MD;  Location: [facility] OR;  Service: Vascular;  Laterality: Right;   AMPUTATION Right 03/15/2020   Procedure: RIGHT BELOW KNEE AMPUTATION;  Surgeon: Chuck Hint, MD;  Location: [facility] OR;  Service: Vascular;  Laterality: Right;   CORONARY ANGIOPLASTY WITH STENT  PLACEMENT     CORONARY STENT INTERVENTION N/A 07/16/2021   Procedure: CORONARY STENT INTERVENTION;  Surgeon: Orbie Pyo, MD;  Location: [facility];  Service: Cardiovascular;  Laterality: N/A;   LEFT HEART CATH AND CORONARY ANGIOGRAPHY N/A 07/16/2021   Procedure: LEFT HEART CATH AND CORONARY ANGIOGRAPHY;  Surgeon: Orbie Pyo, MD;  Location: [facility];  Service: Cardiovascular;  Laterality:  N/A;   PERIPHERAL VASCULAR INTERVENTION  12/02/2020   Procedure: PERIPHERAL VASCULAR INTERVENTION;  Surgeon: Leonie Douglas, MD;  Location: [facility];  Service: Cardiovascular;;  Lt SFA, LEIA    FAMILY HISTORY: Family History  Problem Relation Age of Onset   Other Neg Hx     SOCIAL HISTORY: Social History   Socioeconomic History   Marital status: Married    Spouse name: Not on file   Number of children: Not on file   Years of education: Not on file   Highest education level: Not on file  Occupational History   Occupation: Retired  Tobacco Use   Smoking status: Every Day    Current packs/day: 0.25    Average packs/day: 0.3 packs/day for 52.2 years (13.0 ttl pk-yrs)    Types: Cigarettes    Start date: 1973   Smokeless tobacco: Never  Vaping Use   Vaping status: Never Used  Substance and Sexual Activity   Alcohol use: Yes    Alcohol/week: 6.0 standard drinks of alcohol    Types: 6 Cans of beer per week    Comment: etoh abuse   Drug use: Not Currently   Sexual activity: Not on file  Other Topics Concern   Not on file  Social History Narrative   Retired-former Hotel manager,    Residential painting   Married   Lives with wife   Right handed   Caffeine-occasionally   Social Drivers of Health   Financial Resource Strain: Not on file  Food Insecurity: No Food Insecurity (03/18/2023)   Hunger Vital Sign    Worried About Running Out of Food in the Last Year: Never true    Ran Out of Food in the Last Year: Never true  Transportation Needs: No  Transportation Needs (03/18/2023)   PRAPARE - Administrator, Civil Service (Medical): No    Lack of Transportation (Non-Medical): No  Recent Concern: Transportation Needs - Unmet Transportation Needs (03/18/2023)   PRAPARE - Administrator, Civil Service (Medical): Yes    Lack of Transportation (Non-Medical): Yes  Physical Activity: Not on file  Stress: Not on file  Social Connections: Unknown (07/02/2021)   Received from [facility]   Social Network    Social Network: Not on file  Intimate Partner Violence: Not At Risk (03/18/2023)   Humiliation, Afraid, Rape, and Kick questionnaire    Fear of Current or Ex-Partner: No    Emotionally Abused: No    Physically Abused: No    Sexually Abused: No      Levert Feinstein, M.D. Ph.D.  [name] [address], [address] [place], [place] [address] Ph: [phone] Fax: [phone]  CC:  Sabas Sous, MD [address] [place],  [place] [address]  Clinic, [facility]

## 2023-04-29 ENCOUNTER — Ambulatory Visit: Attending: Cardiovascular Disease | Admitting: Cardiovascular Disease

## 2023-04-30 DIAGNOSIS — E1165 Type 2 diabetes mellitus with hyperglycemia: Secondary | ICD-10-CM | POA: Diagnosis not present

## 2023-05-03 ENCOUNTER — Ambulatory Visit: Payer: 59 | Admitting: Neurology

## 2023-05-03 ENCOUNTER — Telehealth: Payer: Self-pay | Admitting: Acute Care

## 2023-05-03 ENCOUNTER — Other Ambulatory Visit: Payer: Self-pay

## 2023-05-03 DIAGNOSIS — Z87891 Personal history of nicotine dependence: Secondary | ICD-10-CM

## 2023-05-03 DIAGNOSIS — Z122 Encounter for screening for malignant neoplasm of respiratory organs: Secondary | ICD-10-CM

## 2023-05-03 DIAGNOSIS — F17210 Nicotine dependence, cigarettes, uncomplicated: Secondary | ICD-10-CM

## 2023-05-03 NOTE — Telephone Encounter (Signed)
 Lung Cancer Screening Narrative/Criteria Questionnaire (Cigarette Smokers Only- No Cigars/Pipes/vapes)   Ricardo Howard   SDMV:05/27/23 at 1030am /KATY                                           August 17, 1960              LDCT: 05/31/23 at 1140am / [facility] [address]    62 y.o.   Phone: [phone]  Lung Screening Narrative (confirm age 52-77 yrs Medicare / 50-80 yrs Private pay insurance)   Insurance information:[facility]  & [facility]   Referring Provider:Hester   This screening involves an initial phone call with a team member from our program. It is called a shared decision making visit. The initial meeting is required by insurance and Medicare to make sure you understand the program. This appointment takes about 15-20 minutes to complete. The CT scan will completed at a separate date/time. This scan takes about 5-10 minutes to complete and you may eat and drink before and after the scan.  Criteria questions for Lung Cancer Screening:   Are you a current or former smoker? Current Age began smoking: 63 yo   If you are a former smoker, what year did you quit smoking? Quit once for 5 yrs   To calculate your smoking history, I need an accurate estimate of how many packs of cigarettes you smoked per day and for how many years. (Not just the number of PPD you are now smoking)   Years smoking 52 x Packs per day 1/2 = Pack years 26   (at least 20 pack yrs)   (Make sure they understand that we need to know how much they have smoked in the past, not just the number of PPD they are smoking now)  Do you have a personal history of cancer?  Yes - (type and when diagnosed - 5 yrs cancer free) ? Liver over 5 years ago - patient says wasn't cancer and wife says it was.  No recent treatments.  Treated at [facility] and unable to review records    Do you have a family history of cancer? No  Are you coughing up blood?  No  Have you had unexplained weight loss of 15 lbs or more in the last 6 months? No  It looks like you meet  all criteria.     Additional information: N/A

## 2023-05-16 ENCOUNTER — Encounter: Payer: Self-pay | Admitting: Neurology

## 2023-05-16 ENCOUNTER — Ambulatory Visit (HOSPITAL_COMMUNITY)
Admission: RE | Admit: 2023-05-16 | Discharge: 2023-05-16 | Disposition: A | Discharge status: Home / Self Care | Source: Ambulatory Visit | Attending: Neurology | Admitting: Neurology

## 2023-05-16 DIAGNOSIS — I110 Hypertensive heart disease with heart failure: Secondary | ICD-10-CM | POA: Diagnosis not present

## 2023-05-16 DIAGNOSIS — G459 Transient cerebral ischemic attack, unspecified: Secondary | ICD-10-CM | POA: Insufficient documentation

## 2023-05-16 DIAGNOSIS — I252 Old myocardial infarction: Secondary | ICD-10-CM | POA: Insufficient documentation

## 2023-05-16 DIAGNOSIS — F17200 Nicotine dependence, unspecified, uncomplicated: Secondary | ICD-10-CM | POA: Diagnosis not present

## 2023-05-16 DIAGNOSIS — I2510 Atherosclerotic heart disease of native coronary artery without angina pectoris: Secondary | ICD-10-CM | POA: Insufficient documentation

## 2023-05-16 DIAGNOSIS — I509 Heart failure, unspecified: Secondary | ICD-10-CM | POA: Diagnosis not present

## 2023-05-16 LAB — ECHOCARDIOGRAM COMPLETE
AR max vel: 2.49 cm2
AV Peak grad: 4.9 mmHg
Ao pk vel: 1.11 m/s
Area-P 1/2: 4.06 cm2
Calc EF: 48.5 %
Est EF: 50
MV VTI: 2.86 cm2
S' Lateral: 3.4 cm
Single Plane A2C EF: 45.5 %
Single Plane A4C EF: 49.8 %

## 2023-05-27 ENCOUNTER — Encounter: Payer: Self-pay | Admitting: Adult Health

## 2023-05-27 ENCOUNTER — Ambulatory Visit (INDEPENDENT_AMBULATORY_CARE_PROVIDER_SITE_OTHER): Admitting: Adult Health

## 2023-05-27 DIAGNOSIS — F17210 Nicotine dependence, cigarettes, uncomplicated: Secondary | ICD-10-CM | POA: Diagnosis not present

## 2023-05-27 NOTE — Patient Instructions (Signed)

## 2023-05-27 NOTE — Progress Notes (Signed)
  Virtual Visit via Telephone Note  I connected with Rose Hippler , 05/27/23 10:29 AM by a telemedicine application and verified that I am speaking with the correct person using two identifiers.  Location: Patient: home Provider: home   I discussed the limitations of evaluation and management by telemedicine and the availability of in person appointments. The patient expressed understanding and agreed to proceed.   Shared Decision Making Visit Lung Cancer Screening Program [phone])   Eligibility: 63 y.o. Pack Years Smoking History Calculation = 26 pack years  (# packs/per year x # years smoked) Recent History of coughing up blood  no Unexplained weight loss? no ( >Than 15 pounds within the last 6 months ) Prior History Lung / other cancer = NO  (Diagnosis within the last 5 years already requiring surveillance chest CT Scans). Smoking Status Current Smoker  Visit Components: Discussion included one or more decision making aids. YES Discussion included risk/benefits of screening. YES Discussion included potential follow up diagnostic testing for abnormal scans. YES Discussion included meaning and risk of over diagnosis. YES Discussion included meaning and risk of False Positives. YES Discussion included meaning of total radiation exposure. YES  Counseling Included: Importance of adherence to annual lung cancer LDCT screening. YES Impact of comorbidities on ability to participate in the program. YES Ability and willingness to under diagnostic treatment. YES  Smoking Cessation Counseling: Current Smokers:  Discussed importance of smoking cessation. yes Information about tobacco cessation classes and interventions provided to patient. yes Patient provided with "ticket" for LDCT Scan. yes Symptomatic Patient. NO Diagnosis Code: Tobacco Use Z72.0 Asymptomatic Patient yes  Counseling (Intermediate counseling: > three minutes counseling) U0454 (CT Chest Lung Cancer  Screening Low Dose W/O CM) [id number]  Z12.2-Screening of respiratory organs Z87.891-Personal history of nicotine dependence   Danford Bad 05/27/23

## 2023-05-29 NOTE — Progress Notes (Unsigned)
 Cardiology Office Note:  .   Date:  05/29/2023  ID:  Ricardo Howard, DOB March 15, 1960, MRN [id number] PCP: Clinic, [facility] Providers Cardiologist:  Charlton Haws, MD { Click to update primary MD,subspecialty MD or APP then REFRESH:1}   Patient Profile: .      PMH Chronic combined systolic and diastolic heart failure Coronary artery disease  LHC 07/16/2021 mRCA 20 pRCA 95 Treated with atherectomy and DES x 1 Patent previously placed stent to mid to distal RCA pLCx 40 Moderate ISR of previously placed stent to LCx mLAD 50 LVEDP mildly elevated Hypertension Hyperlipidemia PVD  S/p right BKA S/p left SFA/popliteal artery stenting OSA on CPAP Type 2 diabetes mellitus Medical noncompliance Contrast allergy Tobacco abuse Ozempic induced pancreatitis COPD  Admitted 06/2021 for NSTEMI.  Cath showed high-grade calcified proximal RCA lesion treated with atherectomy and drug-eluting stent x 1 with patent previously placed mid to distal right coronary stent and proximal left circumflex stent, the latter with moderate in-stent restenosis.  Placed on DAPT with Brilinta/aspirin indefinitely given the length of stent in number.  Moderate disease in proximal LAD and LCx to be treated medically.  LVEF 45 to 50% with septal hypokinesis mid/basal wall akinesis.  Brilinta was later changed to Plavix for dyspnea.  Last cardiology clinic visit was 12/06/2021 with Dr. Eden Emms.  He had missed posthospital follow-up appointments and his endocrine doctor at the [name] had left.  His blood sugar was poorly controlled.  He was continued on Plavix, aspirin, atorvastatin, ezetimibe, carvedilol, and Imdur.  He was no longer on ACE or ARB due to soft BP.  He was referred to endocrinology and advised to return for cardiology follow-up in 6 months.  Admission 1/26-1/30/25 for urinary retention, fever, testing positive for influenza A with subsequent sepsis and acute on chronic hypoxic  respiratory failure due to flu and COPD exacerbation.   Echo 05/16/2023 with mildly decreased LVEF 50%, G1DD, inferior hypokinesis, mildly reduced RV systolic function, no significant valve disease.         History of Present Illness: .   Ricardo Howard is a *** 63 y.o. male ***   Discussed the use of AI scribe software for clinical note transcription with the patient, who gave verbal consent to proceed.   ROS: ***       Studies Reviewed: Marland Kitchen         Lipoprotein (a)  Date/Time Value Ref Range Status  07/17/2021 01:12 AM 71.1 (H) <75.0 nmol/L Final    Comment:    (NOTE) Note:  Values greater than or equal to 75.0 nmol/L may       indicate an independent risk factor for CHD,       but must be evaluated with caution when applied       to non-Caucasian populations due to the       influence of genetic factors on Lp(a) across       ethnicities. Performed At: [facility] [address] [place], [place] [id number] Jolene Schimke MD NW:[id number]      *** Risk Assessment/Calculations:   {Does this patient have ATRIAL FIBRILLATION?:[phone]} No BP recorded.  {Refresh Note OR Click here to enter BP  :1}***       Physical Exam:   VS:  There were no vitals taken for this visit.   Wt Readings from Last 3 Encounters:  04/26/23 204 lb (92.5 kg)  04/16/23 195 lb (88.5 kg)  04/11/23 220 lb (99.8 kg)  GEN: Well nourished, well developed in no acute distress NECK: No JVD; No carotid bruits CARDIAC: ***RRR, no murmurs, rubs, gallops RESPIRATORY:  Clear to auscultation without rales, wheezing or rhonchi  ABDOMEN: Soft, non-tender, non-distended EXTREMITIES:  No edema; No deformity     ASSESSMENT AND PLAN: .    CAD: ICM/Chronic combined heart failure: Hypertension: Hyperlipidemia LDL Goal < 55:     {Are you ordering a CV Procedure (e.g. stress test, cath, DCCV, TEE, etc)?   Press F2        :[id number]}  Disposition:***  Signed, Eligha Bridegroom, NP-C

## 2023-05-30 ENCOUNTER — Encounter (HOSPITAL_BASED_OUTPATIENT_CLINIC_OR_DEPARTMENT_OTHER): Payer: Self-pay | Admitting: Nurse Practitioner

## 2023-05-30 ENCOUNTER — Ambulatory Visit (HOSPITAL_BASED_OUTPATIENT_CLINIC_OR_DEPARTMENT_OTHER): Admitting: Nurse Practitioner

## 2023-05-30 VITALS — BP 130/82 | HR 91 | Ht 68.0 in | Wt 203.6 lb

## 2023-05-30 DIAGNOSIS — I10 Essential (primary) hypertension: Secondary | ICD-10-CM | POA: Diagnosis not present

## 2023-05-30 DIAGNOSIS — I255 Ischemic cardiomyopathy: Secondary | ICD-10-CM

## 2023-05-30 DIAGNOSIS — I5042 Chronic combined systolic (congestive) and diastolic (congestive) heart failure: Secondary | ICD-10-CM

## 2023-05-30 DIAGNOSIS — E118 Type 2 diabetes mellitus with unspecified complications: Secondary | ICD-10-CM

## 2023-05-30 DIAGNOSIS — I2510 Atherosclerotic heart disease of native coronary artery without angina pectoris: Secondary | ICD-10-CM

## 2023-05-30 DIAGNOSIS — Z794 Long term (current) use of insulin: Secondary | ICD-10-CM

## 2023-05-30 DIAGNOSIS — E785 Hyperlipidemia, unspecified: Secondary | ICD-10-CM

## 2023-05-30 MED ORDER — NEXLETOL 180 MG PO TABS: 1.0000 | ORAL_TABLET | Freq: Every day | ORAL | 3 refills | Status: AC

## 2023-05-30 NOTE — Patient Instructions (Addendum)
 Medication Instructions:  Your physician has recommended you make the following change in your medication:   START Nexletol  *If you need a refill on your cardiac medications before your next appointment, please call your pharmacy*  Lab Work: Your physician recommends that you return for lab work in 3 months: LIPIDS  If you have labs (blood work) drawn today and your tests are completely normal, you will receive your results only by: MyChart Message (if you have MyChart) OR A paper copy in the mail If you have any lab test that is abnormal or we need to change your treatment, we will call you to review the results.   Follow-Up: At [facility], you and your health needs are our priority.  As part of our continuing mission to provide you with exceptional heart care, our providers are all part of one team.  This team includes your primary Cardiologist (physician) and Advanced Practice Providers or APPs (Physician Assistants and Nurse Practitioners) who all work together to provide you with the care you need, when you need it.  Your next appointment:   6 month(s)  Provider:   Charlton Haws, MD     We recommend signing up for the patient portal called "MyChart".  Sign up information is provided on this After Visit Summary.  MyChart is used to connect with patients for Virtual Visits (Telemedicine).  Patients are able to view lab/test results, encounter notes, upcoming appointments, etc.  Non-urgent messages can be sent to your provider as well.   To learn more about what you can do with MyChart, go to [email or web address].

## 2023-05-31 ENCOUNTER — Ambulatory Visit
Admission: RE | Admit: 2023-05-31 | Discharge: 2023-05-31 | Disposition: A | Discharge status: Home / Self Care | Source: Ambulatory Visit | Attending: Cardiovascular Disease | Admitting: Cardiovascular Disease

## 2023-05-31 DIAGNOSIS — Z122 Encounter for screening for malignant neoplasm of respiratory organs: Secondary | ICD-10-CM

## 2023-05-31 DIAGNOSIS — F17210 Nicotine dependence, cigarettes, uncomplicated: Secondary | ICD-10-CM

## 2023-05-31 DIAGNOSIS — Z87891 Personal history of nicotine dependence: Secondary | ICD-10-CM

## 2023-06-21 DIAGNOSIS — E119 Type 2 diabetes mellitus without complications: Secondary | ICD-10-CM | POA: Diagnosis not present

## 2023-06-26 DIAGNOSIS — H25013 Cortical age-related cataract, bilateral: Secondary | ICD-10-CM | POA: Diagnosis not present

## 2023-06-26 DIAGNOSIS — E113393 Type 2 diabetes mellitus with moderate nonproliferative diabetic retinopathy without macular edema, bilateral: Secondary | ICD-10-CM | POA: Diagnosis not present

## 2023-06-26 DIAGNOSIS — H5213 Myopia, bilateral: Secondary | ICD-10-CM | POA: Diagnosis not present

## 2023-06-26 DIAGNOSIS — H52203 Unspecified astigmatism, bilateral: Secondary | ICD-10-CM | POA: Diagnosis not present

## 2023-06-26 DIAGNOSIS — H524 Presbyopia: Secondary | ICD-10-CM | POA: Diagnosis not present

## 2023-06-26 DIAGNOSIS — H11153 Pinguecula, bilateral: Secondary | ICD-10-CM | POA: Diagnosis not present

## 2023-06-26 DIAGNOSIS — H35043 Retinal micro-aneurysms, unspecified, bilateral: Secondary | ICD-10-CM | POA: Diagnosis not present

## 2023-06-26 DIAGNOSIS — H2513 Age-related nuclear cataract, bilateral: Secondary | ICD-10-CM | POA: Diagnosis not present

## 2023-06-26 DIAGNOSIS — Z794 Long term (current) use of insulin: Secondary | ICD-10-CM | POA: Diagnosis not present

## 2023-07-01 ENCOUNTER — Other Ambulatory Visit: Payer: Self-pay

## 2023-07-01 DIAGNOSIS — Z122 Encounter for screening for malignant neoplasm of respiratory organs: Secondary | ICD-10-CM

## 2023-07-01 DIAGNOSIS — Z87891 Personal history of nicotine dependence: Secondary | ICD-10-CM

## 2023-07-01 DIAGNOSIS — F17210 Nicotine dependence, cigarettes, uncomplicated: Secondary | ICD-10-CM

## 2023-07-07 ENCOUNTER — Encounter (HOSPITAL_COMMUNITY): Payer: Self-pay

## 2023-07-07 ENCOUNTER — Emergency Department (HOSPITAL_COMMUNITY)

## 2023-07-07 ENCOUNTER — Other Ambulatory Visit: Payer: Self-pay

## 2023-07-07 ENCOUNTER — Emergency Department (HOSPITAL_COMMUNITY)
Admission: EM | Admit: 2023-07-07 | Discharge: 2023-07-07 | Disposition: A | Discharge status: Home / Self Care | Attending: Emergency Medicine | Admitting: Emergency Medicine

## 2023-07-07 DIAGNOSIS — Z7902 Long term (current) use of antithrombotics/antiplatelets: Secondary | ICD-10-CM | POA: Diagnosis not present

## 2023-07-07 DIAGNOSIS — I110 Hypertensive heart disease with heart failure: Secondary | ICD-10-CM | POA: Insufficient documentation

## 2023-07-07 DIAGNOSIS — Z8673 Personal history of transient ischemic attack (TIA), and cerebral infarction without residual deficits: Secondary | ICD-10-CM | POA: Insufficient documentation

## 2023-07-07 DIAGNOSIS — I509 Heart failure, unspecified: Secondary | ICD-10-CM | POA: Insufficient documentation

## 2023-07-07 DIAGNOSIS — E11628 Type 2 diabetes mellitus with other skin complications: Secondary | ICD-10-CM | POA: Insufficient documentation

## 2023-07-07 DIAGNOSIS — Z79899 Other long term (current) drug therapy: Secondary | ICD-10-CM | POA: Diagnosis not present

## 2023-07-07 DIAGNOSIS — E1165 Type 2 diabetes mellitus with hyperglycemia: Secondary | ICD-10-CM | POA: Insufficient documentation

## 2023-07-07 DIAGNOSIS — L089 Local infection of the skin and subcutaneous tissue, unspecified: Secondary | ICD-10-CM | POA: Insufficient documentation

## 2023-07-07 DIAGNOSIS — I2510 Atherosclerotic heart disease of native coronary artery without angina pectoris: Secondary | ICD-10-CM | POA: Diagnosis not present

## 2023-07-07 DIAGNOSIS — Z794 Long term (current) use of insulin: Secondary | ICD-10-CM | POA: Insufficient documentation

## 2023-07-07 DIAGNOSIS — Z7982 Long term (current) use of aspirin: Secondary | ICD-10-CM | POA: Insufficient documentation

## 2023-07-07 DIAGNOSIS — L97529 Non-pressure chronic ulcer of other part of left foot with unspecified severity: Secondary | ICD-10-CM | POA: Diagnosis present

## 2023-07-07 LAB — CBC WITH DIFFERENTIAL/PLATELET
Abs Immature Granulocytes: 0.03 10*3/uL (ref 0.00–0.07)
Basophils Absolute: 0 10*3/uL (ref 0.0–0.1)
Basophils Relative: 1 %
Eosinophils Absolute: 0.1 10*3/uL (ref 0.0–0.5)
Eosinophils Relative: 1 %
HCT: 47.7 % (ref 39.0–52.0)
Hemoglobin: 15.6 g/dL (ref 13.0–17.0)
Immature Granulocytes: 1 %
Lymphocytes Relative: 31 %
Lymphs Abs: 1.9 10*3/uL (ref 0.7–4.0)
MCH: 30.4 pg (ref 26.0–34.0)
MCHC: 32.7 g/dL (ref 30.0–36.0)
MCV: 92.8 fL (ref 80.0–100.0)
Monocytes Absolute: 0.4 10*3/uL (ref 0.1–1.0)
Monocytes Relative: 6 %
Neutro Abs: 3.7 10*3/uL (ref 1.7–7.7)
Neutrophils Relative %: 60 %
Platelets: 125 10*3/uL — ABNORMAL LOW (ref 150–400)
RBC: 5.14 MIL/uL (ref 4.22–5.81)
RDW: 15.8 % — ABNORMAL HIGH (ref 11.5–15.5)
WBC: 6 10*3/uL (ref 4.0–10.5)
nRBC: 0 % (ref 0.0–0.2)

## 2023-07-07 LAB — COMPREHENSIVE METABOLIC PANEL WITH GFR
ALT: 50 U/L — ABNORMAL HIGH (ref 0–44)
AST: 44 U/L — ABNORMAL HIGH (ref 15–41)
Albumin: 3.4 g/dL — ABNORMAL LOW (ref 3.5–5.0)
Alkaline Phosphatase: 80 U/L (ref 38–126)
Anion gap: 12 (ref 5–15)
BUN: 15 mg/dL (ref 8–23)
CO2: 22 mmol/L (ref 22–32)
Calcium: 9.1 mg/dL (ref 8.9–10.3)
Chloride: 101 mmol/L (ref 98–111)
Creatinine, Ser: 0.79 mg/dL (ref 0.61–1.24)
GFR, Estimated: >60 mL/min (ref >60)
Glucose, Bld: 142 mg/dL — ABNORMAL HIGH (ref 70–99)
Potassium: 4.5 mmol/L (ref 3.5–5.1)
Sodium: 135 mmol/L (ref 135–145)
Total Bilirubin: 1 mg/dL (ref 0.0–1.2)
Total Protein: 7.6 g/dL (ref 6.5–8.1)

## 2023-07-07 LAB — PROTIME-INR
INR: 1 (ref 0.8–1.2)
Prothrombin Time: 13.5 s (ref 11.4–15.2)

## 2023-07-07 MED ORDER — AMOXICILLIN-POT CLAVULANATE 875-125 MG PO TABS
1.0000 | ORAL_TABLET | Freq: Once | ORAL | Status: AC
  Administered 2023-07-07: 1 via ORAL
  Filled 2023-07-07: qty 1

## 2023-07-07 MED ORDER — AMOXICILLIN-POT CLAVULANATE 875-125 MG PO TABS: 1.0000 | ORAL_TABLET | Freq: Two times a day (BID) | ORAL | 0 refills | Status: AC

## 2023-07-07 NOTE — ED Provider Notes (Addendum)
  EMERGENCY DEPARTMENT AT [facility] Provider Note   CSN: [id number] Arrival date & time: 07/07/23  1117     History  Chief Complaint  Patient presents with   Foot ulcer   HPI Ricardo Howard is a 63 y.o. male with type 2 diabetes, CHF, hypertension, CAD, TIA presenting for foot ulcer.  States he noticed it last night.  Located on the left great toe.  Also noted earlier today that he feels like he has a similar lesion to the third toe on the left foot as well.  Endorses a malodorous smell.  Denies pain but states he does have peripheral neuropathy in believes that he would not feel it if he did.  Denies fever or chills.  His wife reports that his blood sugar has been running high at home in the 300s which is unusual for him.  He has been taking his insulin  as prescribed.  HPI     Home Medications Prior to Admission medications   Medication Sig Start Date End Date Taking? Authorizing Provider  amoxicillin -clavulanate (AUGMENTIN ) 875-125 MG tablet Take 1 tablet by mouth every 12 (twelve) hours for 10 days. 07/07/23 07/17/23 Yes Anatole Apollo K, PA-C  acetaminophen  (TYLENOL ) 325 MG tablet Take 2 tablets (650 mg total) by mouth every 6 (six) hours as needed for mild pain (or Fever >/= 101). 06/20/22   Elgergawy, Ardia Kraft, MD  albuterol  (VENTOLIN  HFA) 108 (90 Base) MCG/ACT inhaler Inhale 2 puffs into the lungs every 6 (six) hours as needed for wheezing or shortness of breath. 03/21/23   Ghimire, Estil Heman, MD  aspirin  EC 81 MG tablet Take 81 mg by mouth in the morning.    [provider]  atorvastatin  (LIPITOR ) 80 MG tablet Take 1 tablet (80 mg total) by mouth at bedtime. 08/30/21   Nishan, Peter C, MD  Bempedoic Acid  (NEXLETOL ) 180 MG TABS Take 1 tablet (180 mg total) by mouth daily. 05/30/23   Swinyer, Leilani Punter, NP  budesonide -formoterol  (SYMBICORT ) 160-4.5 MCG/ACT inhaler Inhale 2 puffs into the lungs 2 (two) times daily. 03/21/23   Ghimire, Estil Heman, MD   buPROPion  (WELLBUTRIN  SR) 150 MG 12 hr tablet Take 150 mg by mouth 2 (two) times daily.    [provider]  clopidogrel  (PLAVIX ) 75 MG tablet Take 75 mg by mouth in the morning.    [provider]  empagliflozin  (JARDIANCE ) 25 MG TABS tablet Take 1 tablet (25 mg total) by mouth daily. Patient taking differently: Take 12.5 mg by mouth daily. 08/30/21   Nishan, Peter C, MD  finasteride  (PROSCAR ) 5 MG tablet Take 5 mg by mouth in the morning.    [provider]  folic acid  (FOLVITE ) 1 MG tablet Take 1 tablet (1 mg total) by mouth daily. Patient taking differently: Take 1 mg by mouth in the morning. 10/11/19   Armenta Landau, MD  furosemide  (LASIX ) 40 MG tablet Take 1 tablet (40 mg total) by mouth daily. Patient taking differently: Take 40 mg by mouth in the morning. Take 1 tablet (40 mg total) by mouth daily. 06/23/22   Elgergawy, Ardia Kraft, MD  gabapentin  (NEURONTIN ) 300 MG capsule Take 300 mg by mouth 3 (three) times daily. Take 1 capsule (300 mg) by mouth in the morning, take 1 capsule (300 mg) by mouth at lunch, & take 2 capsules (600 mg) by mouth at bedtime    [provider]  guaiFENesin  (MUCINEX ) 600 MG 12 hr tablet Take 1 tablet (600  mg total) by mouth 2 (two) times daily. 02/14/23   Krishnan, Gokul, MD  insulin  aspart (NOVOLOG ) 100 UNIT/ML injection Inject 10 Units into the skin 3 (three) times daily with meals. 02/14/23   Krishnan, Gokul, MD  insulin  glargine (LANTUS ) 100 UNIT/ML Solostar Pen 30units in am and 35unit at night  Further adjustment per your pcp and endocrinology Goal of a1c is less than 7%, currently your a1c is 10%, not at goal Patient taking differently: Inject 65 Units into the skin daily. Further adjustment per your pcp and endocrinology Goal of a1c is less than 7%, currently your a1c is 10%, not at goal 06/03/21   Lavaughn Portland, MD  Insulin  Pen Needle (NOVOFINE) 30G X 8 MM MISC Inject 10 each into the skin as needed. 10/10/19   Armenta Landau, MD  metoprolol  tartrate (LOPRESSOR ) 100 MG tablet Take 50 mg by mouth in the morning.    [provider]  Multiple Vitamin (QUINTABS) TABS Take 1 tablet by mouth daily.    [provider]  naproxen sodium (ALEVE) 220 MG tablet Take 220 mg by mouth daily as needed (headache).    [provider]  nitroGLYCERIN  (NITROSTAT ) 0.4 MG SL tablet Place 1 tablet (0.4 mg total) under the tongue every 5 (five) minutes as needed for chest pain. 07/19/21   Toy Freund, MD  potassium chloride  SA (KLOR-CON  M) 20 MEQ tablet Take 1 tablet (20 mEq total) by mouth daily. Patient taking differently: Take 20 mEq by mouth daily after lunch. 08/31/21   Bhagat, Annia Kilts, PA  predniSONE  (DELTASONE ) 10 MG tablet Take 40 mg daily for 1 day, 30 mg daily for 1 day, 20 mg daily for 1 days,10 mg daily for 1 day, then stop 03/21/23   Burton Casey, MD  ranolazine  (RANEXA ) 500 MG 12 hr tablet Take 500 mg by mouth 2 (two) times daily. 09/27/21   [provider]  sacubitril -valsartan  (ENTRESTO ) 24-26 MG Take 1 tablet by mouth 2 (two) times daily. 06/25/22   Elgergawy, Ardia Kraft, MD  sertraline  (ZOLOFT ) 100 MG tablet Take 150 mg by mouth daily.    [provider]  tamsulosin  (FLOMAX ) 0.4 MG CAPS capsule Take 0.4 mg by mouth daily.    [provider]  thiamine  (VITAMIN B-1) 100 MG tablet Take 1 tablet (100 mg total) by mouth daily. 02/15/23   Krishnan, Gokul, MD      Allergies    Iohexol , Ozempic (0.25 or 0.5 mg-dose) [semaglutide(0.25 or 0.5mg -dos)], Shellfish allergy, Milk (cow), Zestril [lisinopril], and Zocor [simvastatin]    Review of Systems   See HPI  Physical Exam Updated Vital Signs BP 105/89   Pulse 83   Temp 97.6 F (36.4 C)   Resp 18   Ht 5\' 8"  (1.727 m)   Wt 88.5 kg   SpO2 99%   BMI 29.65 kg/m  Physical Exam Vitals and nursing note reviewed.  HENT:     Head: Normocephalic and atraumatic.     Mouth/Throat:     Mouth: Mucous membranes are  moist.  Eyes:     General:        Right eye: No discharge.        Left eye: No discharge.     Conjunctiva/sclera: Conjunctivae normal.  Cardiovascular:     Rate and Rhythm: Normal rate and regular rhythm.     Pulses: Normal pulses.     Heart sounds: Normal heart sounds.  Pulmonary:     Effort: Pulmonary effort is normal.  Breath sounds: Normal breath sounds.  Abdominal:     General: Abdomen is flat.     Palpations: Abdomen is soft.  Feet:     Comments: Ulcer-like lesion noted to the left great toe and third digit.  Malodorous smell noted.  Not oozing or bleeding.  Not tender to touch.  Some surrounding edema but no erythema.  Pedal pulses are 2+ bilaterally. See picture below.  Skin:    General: Skin is warm and dry.  Neurological:     General: No focal deficit present.  Psychiatric:        Mood and Affect: Mood normal.        ED Results / Procedures / Treatments   Labs (all labs ordered are listed, but only abnormal results are displayed) Labs Reviewed  CBC WITH DIFFERENTIAL/PLATELET - Abnormal; Notable for the following components:      Result Value   RDW 15.8 (*)    Platelets 125 (*)    All other components within normal limits  COMPREHENSIVE METABOLIC PANEL WITH GFR - Abnormal; Notable for the following components:   Glucose, Bld 142 (*)    Albumin 3.4 (*)    AST 44 (*)    ALT 50 (*)    All other components within normal limits  PROTIME-INR    EKG None  Radiology DG Foot Complete Left Result Date: 07/07/2023 CLINICAL DATA:  Ulcer. History of diabetes. Ulcer on the great toe. EXAM: LEFT FOOT - COMPLETE 3+ VIEW COMPARISON:  None Available. FINDINGS: No evidence of acute fracture. Findings suggestive of remote healed fracture of the first metatarsal. Hallux valgus and degenerative change of the first metatarsal phalangeal joint. Hammertoe deformity of the second through fifth toes, this limits assessment of the digits. No obvious erosive or bony destructive  change. The site of ulcer is not well delineated by radiograph. No tracking soft tissue gas. No radiopaque foreign body. Peripheral vascular calcifications are seen. IMPRESSION: 1. No radiographic evidence of osteomyelitis. Ulcer is not well delineated by radiographs. 2. Hallux valgus and degenerative change of the first metatarsophalangeal joint. 3. Hammertoe deformity of the second through fifth toes. Electronically Signed   By: Chadwick Colonel M.D.   On: 07/07/2023 12:52    Procedures Procedures    Medications Ordered in ED Medications  amoxicillin -clavulanate (AUGMENTIN ) 875-125 MG per tablet 1 tablet (has no administration in time range)    ED Course/ Medical Decision Making/ A&P                                 Medical Decision Making Risk Prescription drug management.   63 year old well-appearing male presenting for foot ulcer.  Exam notable for ulceration like lesion on the left great toe and third digits of the left foot as well, also malodorous.  DDx includes diabetic foot wound, cellulitis, osteomyelitis, fracture dislocation, other.  X-ray was negative for fracture or dislocation and did not reveal suggestions of osteomyelitis.  Suspect it is a diabetic infected foot wound.  Discussed antibiotic coverage with pharmacy who agreed that Augmentin  would be an appropriate choice at this time.  Also workup does not suggest sepsis.  Labs are unremarkable, glucose is 142 and white blood cell count is normal.  Advised him to follow-up with his PCP within the next 5 to 7 days.  Discussed strict return precautions.  Sent Augmentin  to his pharmacy.  Discharged in good condition.  Final Clinical Impression(s) / ED Diagnoses Final diagnoses:  Diabetic foot infection [facility])    Rx / DC Orders ED Discharge Orders          Ordered    amoxicillin -clavulanate (AUGMENTIN ) 875-125 MG tablet  Every 12 hours        07/07/23 1341              Onyinyechi Huante K, PA-C 07/07/23  1341    Janalee Mcmurray, PA-C 07/07/23 1341    Almond Army, MD 07/10/23 [phone]

## 2023-07-07 NOTE — Discharge Instructions (Signed)
 Evaluation revealed concern that you have an infection in the big toe and third toe on your left foot.  I am starting you on an antibiotic called Augmentin .  Please take the entire course.  Please follow-up with your PCP in the next 5 to 7 days.  If you develop a fever, worsening swelling or redness, the area becomes hot to touch, or any other concerning symptom please return to the ED for further evaluation.

## 2023-07-07 NOTE — ED Provider Triage Note (Signed)
 Emergency Medicine Provider Triage Evaluation Note  Ricardo Howard , a 63 y.o. male  was evaluated in triage.  Pt complains of an ulcer on his foot.  Pt reports he noticed a sore on his foot   Review of Systems  Positive: Dark toe Negative: fever  Physical Exam  BP 105/89   Pulse 83   Temp 97.6 F (36.4 C)   Resp 18   Ht 5\' 8"  (1.727 m)   Wt 88.5 kg   SpO2 99%   BMI 29.65 kg/m  Gen:   Awake, no distress   Resp:  Normal effort  MSK:   Ulcer 1st, 2nd 3rd toe dark 2nd toe Other:    Medical Decision Making  Medically screening exam initiated at 11:41 AM.  Appropriate orders placed.  Parthenia Bolt was informed that the remainder of the evaluation will be completed by another provider, this initial triage assessment does not replace that evaluation, and the importance of remaining in the ED until their evaluation is complete.     Sandi Crosby, PA-C 07/07/23 1143

## 2023-07-07 NOTE — ED Triage Notes (Signed)
 Pt came in via POV d/t Lt foot ulcer on the great toe that he noticed last night. States the way it looks it could have been there a while because its spreading to the 2 toes beside it. A/Ox4, denies pain, Hx of DM.

## 2023-07-09 ENCOUNTER — Telehealth: Payer: Self-pay

## 2023-07-09 NOTE — Telephone Encounter (Signed)
-----   Message from Janelle Mediate sent at 07/04/2023 10:16 PM EDT ----- No lung cancer repeat CT in a year ----- Message ----- From: Elwyn Hamper, RN Sent: 07/01/2023   4:02 PM EDT To: Ricardo Rule, MD  Results have been sent to the patient. He will complete annual scan again next year.

## 2023-07-09 NOTE — Telephone Encounter (Signed)
 Tired to call patient with results, no answer and voicemail is full.

## 2023-07-10 DIAGNOSIS — E11621 Type 2 diabetes mellitus with foot ulcer: Secondary | ICD-10-CM | POA: Diagnosis not present

## 2023-07-10 DIAGNOSIS — I739 Peripheral vascular disease, unspecified: Secondary | ICD-10-CM | POA: Diagnosis not present

## 2023-07-10 DIAGNOSIS — E1140 Type 2 diabetes mellitus with diabetic neuropathy, unspecified: Secondary | ICD-10-CM | POA: Diagnosis not present

## 2023-07-10 DIAGNOSIS — L97522 Non-pressure chronic ulcer of other part of left foot with fat layer exposed: Secondary | ICD-10-CM | POA: Diagnosis not present

## 2023-07-25 ENCOUNTER — Encounter (HOSPITAL_COMMUNITY): Payer: Self-pay

## 2023-07-25 DIAGNOSIS — J9690 Respiratory failure, unspecified, unspecified whether with hypoxia or hypercapnia: Secondary | ICD-10-CM | POA: Diagnosis not present

## 2023-08-06 DIAGNOSIS — E785 Hyperlipidemia, unspecified: Secondary | ICD-10-CM | POA: Diagnosis not present

## 2023-08-06 DIAGNOSIS — M79673 Pain in unspecified foot: Secondary | ICD-10-CM | POA: Diagnosis not present

## 2023-08-06 DIAGNOSIS — Z743 Need for continuous supervision: Secondary | ICD-10-CM | POA: Diagnosis not present

## 2023-08-06 DIAGNOSIS — M19071 Primary osteoarthritis, right ankle and foot: Secondary | ICD-10-CM | POA: Diagnosis not present

## 2023-08-06 DIAGNOSIS — J449 Chronic obstructive pulmonary disease, unspecified: Secondary | ICD-10-CM | POA: Diagnosis not present

## 2023-08-06 DIAGNOSIS — R531 Weakness: Secondary | ICD-10-CM | POA: Diagnosis not present

## 2023-08-06 DIAGNOSIS — I10 Essential (primary) hypertension: Secondary | ICD-10-CM | POA: Diagnosis not present

## 2023-08-06 DIAGNOSIS — Z89421 Acquired absence of other right toe(s): Secondary | ICD-10-CM | POA: Diagnosis not present

## 2023-08-06 DIAGNOSIS — M6281 Muscle weakness (generalized): Secondary | ICD-10-CM | POA: Diagnosis not present

## 2023-08-06 DIAGNOSIS — J9621 Acute and chronic respiratory failure with hypoxia: Secondary | ICD-10-CM | POA: Diagnosis not present

## 2023-08-06 DIAGNOSIS — K219 Gastro-esophageal reflux disease without esophagitis: Secondary | ICD-10-CM | POA: Diagnosis not present

## 2023-08-06 DIAGNOSIS — A419 Sepsis, unspecified organism: Secondary | ICD-10-CM | POA: Diagnosis not present

## 2023-08-06 DIAGNOSIS — G4733 Obstructive sleep apnea (adult) (pediatric): Secondary | ICD-10-CM | POA: Diagnosis not present

## 2023-08-06 DIAGNOSIS — E1142 Type 2 diabetes mellitus with diabetic polyneuropathy: Secondary | ICD-10-CM | POA: Diagnosis not present

## 2023-08-06 DIAGNOSIS — I259 Chronic ischemic heart disease, unspecified: Secondary | ICD-10-CM | POA: Diagnosis not present

## 2023-08-06 DIAGNOSIS — M86172 Other acute osteomyelitis, left ankle and foot: Secondary | ICD-10-CM | POA: Diagnosis not present

## 2023-08-06 DIAGNOSIS — M5459 Other low back pain: Secondary | ICD-10-CM | POA: Diagnosis not present

## 2023-08-06 DIAGNOSIS — N179 Acute kidney failure, unspecified: Secondary | ICD-10-CM | POA: Diagnosis not present

## 2023-08-06 DIAGNOSIS — I739 Peripheral vascular disease, unspecified: Secondary | ICD-10-CM | POA: Diagnosis not present

## 2023-08-06 DIAGNOSIS — G63 Polyneuropathy in diseases classified elsewhere: Secondary | ICD-10-CM | POA: Diagnosis not present

## 2023-08-06 DIAGNOSIS — R262 Difficulty in walking, not elsewhere classified: Secondary | ICD-10-CM | POA: Diagnosis not present

## 2023-08-06 DIAGNOSIS — I2510 Atherosclerotic heart disease of native coronary artery without angina pectoris: Secondary | ICD-10-CM | POA: Diagnosis not present

## 2023-08-07 DIAGNOSIS — I739 Peripheral vascular disease, unspecified: Secondary | ICD-10-CM | POA: Diagnosis not present

## 2023-08-07 DIAGNOSIS — I259 Chronic ischemic heart disease, unspecified: Secondary | ICD-10-CM | POA: Diagnosis not present

## 2023-08-07 DIAGNOSIS — M5459 Other low back pain: Secondary | ICD-10-CM | POA: Diagnosis not present

## 2023-08-07 DIAGNOSIS — A419 Sepsis, unspecified organism: Secondary | ICD-10-CM | POA: Diagnosis not present

## 2023-08-07 DIAGNOSIS — N179 Acute kidney failure, unspecified: Secondary | ICD-10-CM | POA: Diagnosis not present

## 2023-08-07 DIAGNOSIS — G4733 Obstructive sleep apnea (adult) (pediatric): Secondary | ICD-10-CM | POA: Diagnosis not present

## 2023-08-07 DIAGNOSIS — E785 Hyperlipidemia, unspecified: Secondary | ICD-10-CM | POA: Diagnosis not present

## 2023-08-07 DIAGNOSIS — M86172 Other acute osteomyelitis, left ankle and foot: Secondary | ICD-10-CM | POA: Diagnosis not present

## 2023-08-07 DIAGNOSIS — M19071 Primary osteoarthritis, right ankle and foot: Secondary | ICD-10-CM | POA: Diagnosis not present

## 2023-08-07 DIAGNOSIS — K219 Gastro-esophageal reflux disease without esophagitis: Secondary | ICD-10-CM | POA: Diagnosis not present

## 2023-08-07 DIAGNOSIS — I10 Essential (primary) hypertension: Secondary | ICD-10-CM | POA: Diagnosis not present

## 2023-08-08 DIAGNOSIS — M86172 Other acute osteomyelitis, left ankle and foot: Secondary | ICD-10-CM | POA: Diagnosis not present

## 2023-08-08 DIAGNOSIS — K219 Gastro-esophageal reflux disease without esophagitis: Secondary | ICD-10-CM | POA: Diagnosis not present

## 2023-08-08 DIAGNOSIS — M5459 Other low back pain: Secondary | ICD-10-CM | POA: Diagnosis not present

## 2023-08-08 DIAGNOSIS — I259 Chronic ischemic heart disease, unspecified: Secondary | ICD-10-CM | POA: Diagnosis not present

## 2023-08-08 DIAGNOSIS — I10 Essential (primary) hypertension: Secondary | ICD-10-CM | POA: Diagnosis not present

## 2023-08-08 DIAGNOSIS — M19071 Primary osteoarthritis, right ankle and foot: Secondary | ICD-10-CM | POA: Diagnosis not present

## 2023-08-08 DIAGNOSIS — A419 Sepsis, unspecified organism: Secondary | ICD-10-CM | POA: Diagnosis not present

## 2023-08-08 DIAGNOSIS — N179 Acute kidney failure, unspecified: Secondary | ICD-10-CM | POA: Diagnosis not present

## 2023-08-08 DIAGNOSIS — E785 Hyperlipidemia, unspecified: Secondary | ICD-10-CM | POA: Diagnosis not present

## 2023-08-08 DIAGNOSIS — G4733 Obstructive sleep apnea (adult) (pediatric): Secondary | ICD-10-CM | POA: Diagnosis not present

## 2023-08-08 DIAGNOSIS — I739 Peripheral vascular disease, unspecified: Secondary | ICD-10-CM | POA: Diagnosis not present

## 2023-08-09 DIAGNOSIS — E785 Hyperlipidemia, unspecified: Secondary | ICD-10-CM | POA: Diagnosis not present

## 2023-08-09 DIAGNOSIS — I739 Peripheral vascular disease, unspecified: Secondary | ICD-10-CM | POA: Diagnosis not present

## 2023-08-09 DIAGNOSIS — A419 Sepsis, unspecified organism: Secondary | ICD-10-CM | POA: Diagnosis not present

## 2023-08-09 DIAGNOSIS — K219 Gastro-esophageal reflux disease without esophagitis: Secondary | ICD-10-CM | POA: Diagnosis not present

## 2023-08-09 DIAGNOSIS — M86172 Other acute osteomyelitis, left ankle and foot: Secondary | ICD-10-CM | POA: Diagnosis not present

## 2023-08-09 DIAGNOSIS — M5459 Other low back pain: Secondary | ICD-10-CM | POA: Diagnosis not present

## 2023-08-09 DIAGNOSIS — M19071 Primary osteoarthritis, right ankle and foot: Secondary | ICD-10-CM | POA: Diagnosis not present

## 2023-08-09 DIAGNOSIS — N179 Acute kidney failure, unspecified: Secondary | ICD-10-CM | POA: Diagnosis not present

## 2023-08-09 DIAGNOSIS — G4733 Obstructive sleep apnea (adult) (pediatric): Secondary | ICD-10-CM | POA: Diagnosis not present

## 2023-08-09 DIAGNOSIS — I259 Chronic ischemic heart disease, unspecified: Secondary | ICD-10-CM | POA: Diagnosis not present

## 2023-08-09 DIAGNOSIS — I10 Essential (primary) hypertension: Secondary | ICD-10-CM | POA: Diagnosis not present

## 2023-08-12 DIAGNOSIS — M5459 Other low back pain: Secondary | ICD-10-CM | POA: Diagnosis not present

## 2023-08-12 DIAGNOSIS — N179 Acute kidney failure, unspecified: Secondary | ICD-10-CM | POA: Diagnosis not present

## 2023-08-12 DIAGNOSIS — G4733 Obstructive sleep apnea (adult) (pediatric): Secondary | ICD-10-CM | POA: Diagnosis not present

## 2023-08-12 DIAGNOSIS — I10 Essential (primary) hypertension: Secondary | ICD-10-CM | POA: Diagnosis not present

## 2023-08-12 DIAGNOSIS — I739 Peripheral vascular disease, unspecified: Secondary | ICD-10-CM | POA: Diagnosis not present

## 2023-08-12 DIAGNOSIS — M86172 Other acute osteomyelitis, left ankle and foot: Secondary | ICD-10-CM | POA: Diagnosis not present

## 2023-08-12 DIAGNOSIS — E785 Hyperlipidemia, unspecified: Secondary | ICD-10-CM | POA: Diagnosis not present

## 2023-08-12 DIAGNOSIS — M19071 Primary osteoarthritis, right ankle and foot: Secondary | ICD-10-CM | POA: Diagnosis not present

## 2023-08-12 DIAGNOSIS — I259 Chronic ischemic heart disease, unspecified: Secondary | ICD-10-CM | POA: Diagnosis not present

## 2023-08-12 DIAGNOSIS — A419 Sepsis, unspecified organism: Secondary | ICD-10-CM | POA: Diagnosis not present

## 2023-08-12 DIAGNOSIS — K219 Gastro-esophageal reflux disease without esophagitis: Secondary | ICD-10-CM | POA: Diagnosis not present

## 2023-08-13 DIAGNOSIS — M19071 Primary osteoarthritis, right ankle and foot: Secondary | ICD-10-CM | POA: Diagnosis not present

## 2023-08-13 DIAGNOSIS — N179 Acute kidney failure, unspecified: Secondary | ICD-10-CM | POA: Diagnosis not present

## 2023-08-13 DIAGNOSIS — M86172 Other acute osteomyelitis, left ankle and foot: Secondary | ICD-10-CM | POA: Diagnosis not present

## 2023-08-13 DIAGNOSIS — I259 Chronic ischemic heart disease, unspecified: Secondary | ICD-10-CM | POA: Diagnosis not present

## 2023-08-13 DIAGNOSIS — K219 Gastro-esophageal reflux disease without esophagitis: Secondary | ICD-10-CM | POA: Diagnosis not present

## 2023-08-13 DIAGNOSIS — A419 Sepsis, unspecified organism: Secondary | ICD-10-CM | POA: Diagnosis not present

## 2023-08-13 DIAGNOSIS — I10 Essential (primary) hypertension: Secondary | ICD-10-CM | POA: Diagnosis not present

## 2023-08-13 DIAGNOSIS — E785 Hyperlipidemia, unspecified: Secondary | ICD-10-CM | POA: Diagnosis not present

## 2023-08-13 DIAGNOSIS — I739 Peripheral vascular disease, unspecified: Secondary | ICD-10-CM | POA: Diagnosis not present

## 2023-08-13 DIAGNOSIS — G4733 Obstructive sleep apnea (adult) (pediatric): Secondary | ICD-10-CM | POA: Diagnosis not present

## 2023-08-13 DIAGNOSIS — M5459 Other low back pain: Secondary | ICD-10-CM | POA: Diagnosis not present

## 2023-08-14 DIAGNOSIS — M86172 Other acute osteomyelitis, left ankle and foot: Secondary | ICD-10-CM | POA: Diagnosis not present

## 2023-08-14 DIAGNOSIS — N179 Acute kidney failure, unspecified: Secondary | ICD-10-CM | POA: Diagnosis not present

## 2023-08-14 DIAGNOSIS — I739 Peripheral vascular disease, unspecified: Secondary | ICD-10-CM | POA: Diagnosis not present

## 2023-08-14 DIAGNOSIS — E785 Hyperlipidemia, unspecified: Secondary | ICD-10-CM | POA: Diagnosis not present

## 2023-08-14 DIAGNOSIS — G4733 Obstructive sleep apnea (adult) (pediatric): Secondary | ICD-10-CM | POA: Diagnosis not present

## 2023-08-14 DIAGNOSIS — I10 Essential (primary) hypertension: Secondary | ICD-10-CM | POA: Diagnosis not present

## 2023-08-14 DIAGNOSIS — M19071 Primary osteoarthritis, right ankle and foot: Secondary | ICD-10-CM | POA: Diagnosis not present

## 2023-08-14 DIAGNOSIS — M5459 Other low back pain: Secondary | ICD-10-CM | POA: Diagnosis not present

## 2023-08-14 DIAGNOSIS — K219 Gastro-esophageal reflux disease without esophagitis: Secondary | ICD-10-CM | POA: Diagnosis not present

## 2023-08-14 DIAGNOSIS — A419 Sepsis, unspecified organism: Secondary | ICD-10-CM | POA: Diagnosis not present

## 2023-08-14 DIAGNOSIS — I259 Chronic ischemic heart disease, unspecified: Secondary | ICD-10-CM | POA: Diagnosis not present

## 2023-08-15 DIAGNOSIS — N179 Acute kidney failure, unspecified: Secondary | ICD-10-CM | POA: Diagnosis not present

## 2023-08-15 DIAGNOSIS — M5459 Other low back pain: Secondary | ICD-10-CM | POA: Diagnosis not present

## 2023-08-15 DIAGNOSIS — G4733 Obstructive sleep apnea (adult) (pediatric): Secondary | ICD-10-CM | POA: Diagnosis not present

## 2023-08-15 DIAGNOSIS — K219 Gastro-esophageal reflux disease without esophagitis: Secondary | ICD-10-CM | POA: Diagnosis not present

## 2023-08-15 DIAGNOSIS — M86172 Other acute osteomyelitis, left ankle and foot: Secondary | ICD-10-CM | POA: Diagnosis not present

## 2023-08-15 DIAGNOSIS — I739 Peripheral vascular disease, unspecified: Secondary | ICD-10-CM | POA: Diagnosis not present

## 2023-08-15 DIAGNOSIS — E785 Hyperlipidemia, unspecified: Secondary | ICD-10-CM | POA: Diagnosis not present

## 2023-08-15 DIAGNOSIS — I259 Chronic ischemic heart disease, unspecified: Secondary | ICD-10-CM | POA: Diagnosis not present

## 2023-08-15 DIAGNOSIS — A419 Sepsis, unspecified organism: Secondary | ICD-10-CM | POA: Diagnosis not present

## 2023-08-15 DIAGNOSIS — I10 Essential (primary) hypertension: Secondary | ICD-10-CM | POA: Diagnosis not present

## 2023-08-15 DIAGNOSIS — M19071 Primary osteoarthritis, right ankle and foot: Secondary | ICD-10-CM | POA: Diagnosis not present

## 2023-08-16 ENCOUNTER — Ambulatory Visit (INDEPENDENT_AMBULATORY_CARE_PROVIDER_SITE_OTHER): Admitting: Podiatry

## 2023-08-16 ENCOUNTER — Other Ambulatory Visit: Payer: Self-pay | Admitting: *Deleted

## 2023-08-16 DIAGNOSIS — Z89421 Acquired absence of other right toe(s): Secondary | ICD-10-CM | POA: Diagnosis not present

## 2023-08-16 DIAGNOSIS — I10 Essential (primary) hypertension: Secondary | ICD-10-CM | POA: Diagnosis not present

## 2023-08-16 DIAGNOSIS — I739 Peripheral vascular disease, unspecified: Secondary | ICD-10-CM

## 2023-08-16 DIAGNOSIS — I259 Chronic ischemic heart disease, unspecified: Secondary | ICD-10-CM | POA: Diagnosis not present

## 2023-08-16 DIAGNOSIS — G4733 Obstructive sleep apnea (adult) (pediatric): Secondary | ICD-10-CM | POA: Diagnosis not present

## 2023-08-16 DIAGNOSIS — M5459 Other low back pain: Secondary | ICD-10-CM | POA: Diagnosis not present

## 2023-08-16 DIAGNOSIS — E785 Hyperlipidemia, unspecified: Secondary | ICD-10-CM | POA: Diagnosis not present

## 2023-08-16 DIAGNOSIS — M19071 Primary osteoarthritis, right ankle and foot: Secondary | ICD-10-CM | POA: Diagnosis not present

## 2023-08-16 DIAGNOSIS — K219 Gastro-esophageal reflux disease without esophagitis: Secondary | ICD-10-CM | POA: Diagnosis not present

## 2023-08-16 DIAGNOSIS — A419 Sepsis, unspecified organism: Secondary | ICD-10-CM | POA: Diagnosis not present

## 2023-08-16 DIAGNOSIS — M86172 Other acute osteomyelitis, left ankle and foot: Secondary | ICD-10-CM | POA: Diagnosis not present

## 2023-08-16 DIAGNOSIS — N179 Acute kidney failure, unspecified: Secondary | ICD-10-CM | POA: Diagnosis not present

## 2023-08-16 NOTE — Progress Notes (Signed)
 Subjective:  Patient ID: Ricardo Howard, male    DOB: 17-Feb-1961,  MRN: [id number]  Chief Complaint  Patient presents with   Routine Post Op     Procedure: Patient presents with left third digit amputation.  He states he had it done at the [name] at an outside facility.  This was done 3 weeks ago on July 26, 2023.  He is here for follow-up.  He was not able to see anyone at the [name].  Denies any other acute complaints  63 y.o. male returns for post-op check.  He states is doing okay bandages clean dry and intact no acute complication.  He is wearing his regular shoes.  I discussed with him to go to surgical shoe he states understanding his BKA on the right side  Review of Systems: Negative except as noted in the HPI. Denies N/V/F/Ch.  Past Medical History:  Diagnosis Date   Alcohol  abuse 06/14/2019   Anxiety    Back pain    Cataracts, bilateral    CHF (congestive heart failure) (HCC)    Depression    Diabetic retinopathy (HCC)    Essential hypertension 06/14/2019   Hepatitis C    cured   Hx of BKA, right (HCC)    Hyperlipidemia    Hypertension    Mixed hyperlipidemia due to type 2 diabetes mellitus (HCC) 07/31/2019   Nicotine  dependence, cigarettes, uncomplicated 07/31/2019   PAD (peripheral artery disease) (HCC)    Pancreatitis    PTSD (post-traumatic stress disorder)    Uncontrolled type 2 diabetes mellitus with diabetic polyneuropathy, with long-term current use of insulin  07/31/2019    Current Outpatient Medications:    acetaminophen  (TYLENOL ) 325 MG tablet, Take 2 tablets (650 mg total) by mouth every 6 (six) hours as needed for mild pain (or Fever >/= 101)., Disp: , Rfl:    albuterol  (VENTOLIN  HFA) 108 (90 Base) MCG/ACT inhaler, Inhale 2 puffs into the lungs every 6 (six) hours as needed for wheezing or shortness of breath., Disp: 6.7 g, Rfl: 1   aspirin  EC 81 MG tablet, Take 81 mg by mouth in the morning., Disp: , Rfl:    [Paused] atorvastatin  (LIPITOR ) 80 MG tablet, Take  1 tablet (80 mg total) by mouth at bedtime., Disp: 90 tablet, Rfl: 3   Bempedoic Acid  (NEXLETOL ) 180 MG TABS, Take 1 tablet (180 mg total) by mouth daily., Disp: 90 tablet, Rfl: 3   budesonide -formoterol  (SYMBICORT ) 160-4.5 MCG/ACT inhaler, Inhale 2 puffs into the lungs 2 (two) times daily., Disp: 10.2 each, Rfl: 1   buPROPion  (WELLBUTRIN  SR) 150 MG 12 hr tablet, Take 150 mg by mouth 2 (two) times daily., Disp: , Rfl:    clopidogrel  (PLAVIX ) 75 MG tablet, Take 75 mg by mouth in the morning., Disp: , Rfl:    empagliflozin  (JARDIANCE ) 25 MG TABS tablet, Take 1 tablet (25 mg total) by mouth daily. (Patient taking differently: Take 12.5 mg by mouth daily.), Disp: 90 tablet, Rfl: 3   finasteride  (PROSCAR ) 5 MG tablet, Take 5 mg by mouth in the morning., Disp: , Rfl:    folic acid  (FOLVITE ) 1 MG tablet, Take 1 tablet (1 mg total) by mouth daily. (Patient taking differently: Take 1 mg by mouth in the morning.), Disp: , Rfl:    furosemide  (LASIX ) 40 MG tablet, Take 1 tablet (40 mg total) by mouth daily. (Patient taking differently: Take 40 mg by mouth in the morning. Take 1 tablet (40 mg total) by mouth daily.), Disp: 94 tablet, Rfl:  0   gabapentin  (NEURONTIN ) 300 MG capsule, Take 300 mg by mouth 3 (three) times daily. Take 1 capsule (300 mg) by mouth in the morning, take 1 capsule (300 mg) by mouth at lunch, & take 2 capsules (600 mg) by mouth at bedtime, Disp: , Rfl:    guaiFENesin  (MUCINEX ) 600 MG 12 hr tablet, Take 1 tablet (600 mg total) by mouth 2 (two) times daily., Disp: 60 tablet, Rfl: 0   insulin  aspart (NOVOLOG ) 100 UNIT/ML injection, Inject 10 Units into the skin 3 (three) times daily with meals., Disp: , Rfl:    insulin  glargine (LANTUS ) 100 UNIT/ML Solostar Pen, 30units in am and 35unit at night  Further adjustment per your pcp and endocrinology Goal of a1c is less than 7%, currently your a1c is 10%, not at goal (Patient taking differently: Inject 65 Units into the skin daily. Further adjustment  per your pcp and endocrinology Goal of a1c is less than 7%, currently your a1c is 10%, not at goal), Disp: 15 mL, Rfl: 0   Insulin  Pen Needle (NOVOFINE) 30G X 8 MM MISC, Inject 10 each into the skin as needed., Disp: 100 each, Rfl: 0   metoprolol  tartrate (LOPRESSOR ) 100 MG tablet, Take 50 mg by mouth in the morning., Disp: , Rfl:    Multiple Vitamin (QUINTABS) TABS, Take 1 tablet by mouth daily., Disp: , Rfl:    naproxen sodium (ALEVE) 220 MG tablet, Take 220 mg by mouth daily as needed (headache)., Disp: , Rfl:    nitroGLYCERIN  (NITROSTAT ) 0.4 MG SL tablet, Place 1 tablet (0.4 mg total) under the tongue every 5 (five) minutes as needed for chest pain., Disp: 30 tablet, Rfl: 0   potassium chloride  SA (KLOR-CON  M) 20 MEQ tablet, Take 1 tablet (20 mEq total) by mouth daily. (Patient taking differently: Take 20 mEq by mouth daily after lunch.), Disp: 90 tablet, Rfl: 1   predniSONE  (DELTASONE ) 10 MG tablet, Take 40 mg daily for 1 day, 30 mg daily for 1 day, 20 mg daily for 1 days,10 mg daily for 1 day, then stop, Disp: 10 tablet, Rfl: 0   ranolazine  (RANEXA ) 500 MG 12 hr tablet, Take 500 mg by mouth 2 (two) times daily., Disp: , Rfl:    sacubitril -valsartan  (ENTRESTO ) 24-26 MG, Take 1 tablet by mouth 2 (two) times daily., Disp: 60 tablet, Rfl: 0   sertraline  (ZOLOFT ) 100 MG tablet, Take 150 mg by mouth daily., Disp: , Rfl:    tamsulosin  (FLOMAX ) 0.4 MG CAPS capsule, Take 0.4 mg by mouth daily., Disp: , Rfl:    thiamine  (VITAMIN B-1) 100 MG tablet, Take 1 tablet (100 mg total) by mouth daily., Disp: 30 tablet, Rfl: 0  Social History   Tobacco Use  Smoking Status Every Day   Current packs/day: 0.25   Average packs/day: 0.3 packs/day for 52.5 years (13.1 ttl pk-yrs)   Types: Cigarettes   Start date: 1973  Smokeless Tobacco Never  Tobacco Comments   3 cigarettes a day    Allergies  Allergen Reactions   Iohexol  Hives    Patient broke out in hives after injection of Omni 300, will need 13 hour  pre-med in future   Ozempic (0.25 Or 0.5 Mg-Dose) [Semaglutide(0.25 Or 0.5mg -Dos)] Other (See Comments)    pancreatitis   Shellfish Allergy Hives   Milk (Cow) Diarrhea    Finding of gastrointestinal tract gas   Zestril [Lisinopril] Cough   Zocor [Simvastatin] Itching and Cough   Objective:  There were no vitals filed  for this visit. There is no height or weight on file to calculate BMI. Constitutional Well developed. Well nourished.  Vascular Foot warm and well perfused. Capillary refill normal to all digits.   Neurologic Normal speech. Oriented to person, place, and time. Epicritic sensation to light touch grossly present left side  Dermatologic Skin healing well without signs of infection. Skin edges well coapted without signs of infection.  Orthopedic: Tenderness to palpation noted about the surgical site.   Radiographs: None Assessment:   1. History of amputation of right third toe [facility])    Plan:  Patient was evaluated and treated and all questions answered.  S/p foot surgery right -Progressing as expected post-operatively. -XR: See above -WB Status: Weightbearing as tolerated in particular shoe -Sutures: Intact.  No clinical signs of Deis is noted no complication noted -Medications: None -Foot redressed.  No follow-ups on file.

## 2023-08-17 DIAGNOSIS — M19071 Primary osteoarthritis, right ankle and foot: Secondary | ICD-10-CM | POA: Diagnosis not present

## 2023-08-17 DIAGNOSIS — K219 Gastro-esophageal reflux disease without esophagitis: Secondary | ICD-10-CM | POA: Diagnosis not present

## 2023-08-17 DIAGNOSIS — I739 Peripheral vascular disease, unspecified: Secondary | ICD-10-CM | POA: Diagnosis not present

## 2023-08-17 DIAGNOSIS — G4733 Obstructive sleep apnea (adult) (pediatric): Secondary | ICD-10-CM | POA: Diagnosis not present

## 2023-08-17 DIAGNOSIS — N179 Acute kidney failure, unspecified: Secondary | ICD-10-CM | POA: Diagnosis not present

## 2023-08-17 DIAGNOSIS — A419 Sepsis, unspecified organism: Secondary | ICD-10-CM | POA: Diagnosis not present

## 2023-08-17 DIAGNOSIS — M86172 Other acute osteomyelitis, left ankle and foot: Secondary | ICD-10-CM | POA: Diagnosis not present

## 2023-08-17 DIAGNOSIS — I259 Chronic ischemic heart disease, unspecified: Secondary | ICD-10-CM | POA: Diagnosis not present

## 2023-08-17 DIAGNOSIS — E785 Hyperlipidemia, unspecified: Secondary | ICD-10-CM | POA: Diagnosis not present

## 2023-08-17 DIAGNOSIS — I10 Essential (primary) hypertension: Secondary | ICD-10-CM | POA: Diagnosis not present

## 2023-08-17 DIAGNOSIS — M5459 Other low back pain: Secondary | ICD-10-CM | POA: Diagnosis not present

## 2023-08-20 ENCOUNTER — Ambulatory Visit (HOSPITAL_COMMUNITY): Attending: Cardiovascular Disease

## 2023-08-20 ENCOUNTER — Ambulatory Visit: Attending: Cardiovascular Disease

## 2023-08-20 ENCOUNTER — Ambulatory Visit (HOSPITAL_COMMUNITY)

## 2023-08-20 NOTE — Progress Notes (Deleted)
 HISTORY AND PHYSICAL     CC:  follow up. Requesting Provider:  Delford Maude BROCKS, MD  HPI: This is a 63 y.o. male who is here today for follow up for PAD.  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 Eliza.  On 12/02/2020, he underwent angiogram with left SFA and AK popliteal artery and left EIA stenting by Dr. Magda for ulceration.  He underwent left 3rd toe amputation at [facility] on 07/26/2023.   Pt was seen by Podiatry 08/16/2023 and progressing as expected.   Pt was last seen 04/17/2022 and at that time, he was doing well and walking without difficulty.   The pt returns today for follow up.  ***  The pt is on a statin for cholesterol management.    The pt is on an aspirin .    Other AC:  Plavix  The pt is on BB, ARB for hypertension.  The pt is  on diabetic medication. Tobacco hx:  ***  Pt does *** have family hx of AAA.  Past Medical History:  Diagnosis Date   Alcohol  abuse 06/14/2019   Anxiety    Back pain    Cataracts, bilateral    CHF (congestive heart failure) (HCC)    Depression    Diabetic retinopathy (HCC)    Essential hypertension 06/14/2019   Hepatitis C    cured   Hx of BKA, right (HCC)    Hyperlipidemia    Hypertension    Mixed hyperlipidemia due to type 2 diabetes mellitus (HCC) 07/31/2019   Nicotine  dependence, cigarettes, uncomplicated 07/31/2019   PAD (peripheral artery disease) (HCC)    Pancreatitis    PTSD (post-traumatic stress disorder)    Uncontrolled type 2 diabetes mellitus with diabetic polyneuropathy, with long-term current use of insulin  07/31/2019    Past Surgical History:  Procedure Laterality Date   ABDOMINAL AORTOGRAM W/LOWER EXTREMITY N/A 12/02/2020   Procedure: ABDOMINAL AORTOGRAM W/LOWER EXTREMITY;  Surgeon: Magda Debby SAILOR, MD;  Location: [facility];  Service: Cardiovascular;  Laterality: N/A;   AMPUTATION Right 03/13/2020   Procedure: TRANSMETATARSAL AMPUTATION;  Surgeon: Eliza Lonni RAMAN, MD;  Location: [facility] OR;  Service:  Vascular;  Laterality: Right;   AMPUTATION Right 03/15/2020   Procedure: RIGHT BELOW KNEE AMPUTATION;  Surgeon: Eliza Lonni RAMAN, MD;  Location: [facility] OR;  Service: Vascular;  Laterality: Right;   CORONARY ANGIOPLASTY WITH STENT PLACEMENT     CORONARY STENT INTERVENTION N/A 07/16/2021   Procedure: CORONARY STENT INTERVENTION;  Surgeon: Wendel Lurena POUR, MD;  Location: [facility];  Service: Cardiovascular;  Laterality: N/A;   LEFT HEART CATH AND CORONARY ANGIOGRAPHY N/A 07/16/2021   Procedure: LEFT HEART CATH AND CORONARY ANGIOGRAPHY;  Surgeon: Wendel Lurena POUR, MD;  Location: [facility];  Service: Cardiovascular;  Laterality: N/A;   PERIPHERAL VASCULAR INTERVENTION  12/02/2020   Procedure: PERIPHERAL VASCULAR INTERVENTION;  Surgeon: Magda Debby SAILOR, MD;  Location: [facility];  Service: Cardiovascular;;  Lt SFA, LEIA    Allergies  Allergen Reactions   Iohexol  Hives    Patient broke out in hives after injection of Omni 300, will need 13 hour pre-med in future   Ozempic (0.25 Or 0.5 Mg-Dose) [Semaglutide(0.25 Or 0.5mg -Dos)] Other (See Comments)    pancreatitis   Shellfish Allergy Hives   Milk (Cow) Diarrhea    Finding of gastrointestinal tract gas   Zestril [Lisinopril] Cough   Zocor [Simvastatin] Itching and Cough    Current Outpatient Medications  Medication Sig Dispense  Refill   acetaminophen  (TYLENOL ) 325 MG tablet Take 2 tablets (650 mg total) by mouth every 6 (six) hours as needed for mild pain (or Fever >/= 101).     albuterol  (VENTOLIN  HFA) 108 (90 Base) MCG/ACT inhaler Inhale 2 puffs into the lungs every 6 (six) hours as needed for wheezing or shortness of breath. 6.7 g 1   aspirin  EC 81 MG tablet Take 81 mg by mouth in the morning.     [Paused] atorvastatin  (LIPITOR ) 80 MG tablet Take 1 tablet (80 mg total) by mouth at bedtime. 90 tablet 3   Bempedoic Acid  (NEXLETOL ) 180 MG TABS Take 1 tablet (180 mg total) by mouth daily. 90 tablet 3    budesonide -formoterol  (SYMBICORT ) 160-4.5 MCG/ACT inhaler Inhale 2 puffs into the lungs 2 (two) times daily. 10.2 each 1   buPROPion  (WELLBUTRIN  SR) 150 MG 12 hr tablet Take 150 mg by mouth 2 (two) times daily.     clopidogrel  (PLAVIX ) 75 MG tablet Take 75 mg by mouth in the morning.     empagliflozin  (JARDIANCE ) 25 MG TABS tablet Take 1 tablet (25 mg total) by mouth daily. (Patient taking differently: Take 12.5 mg by mouth daily.) 90 tablet 3   finasteride  (PROSCAR ) 5 MG tablet Take 5 mg by mouth in the morning.     folic acid  (FOLVITE ) 1 MG tablet Take 1 tablet (1 mg total) by mouth daily. (Patient taking differently: Take 1 mg by mouth in the morning.)     furosemide  (LASIX ) 40 MG tablet Take 1 tablet (40 mg total) by mouth daily. (Patient taking differently: Take 40 mg by mouth in the morning. Take 1 tablet (40 mg total) by mouth daily.) 94 tablet 0   gabapentin  (NEURONTIN ) 300 MG capsule Take 300 mg by mouth 3 (three) times daily. Take 1 capsule (300 mg) by mouth in the morning, take 1 capsule (300 mg) by mouth at lunch, & take 2 capsules (600 mg) by mouth at bedtime     guaiFENesin  (MUCINEX ) 600 MG 12 hr tablet Take 1 tablet (600 mg total) by mouth 2 (two) times daily. 60 tablet 0   insulin  aspart (NOVOLOG ) 100 UNIT/ML injection Inject 10 Units into the skin 3 (three) times daily with meals.     insulin  glargine (LANTUS ) 100 UNIT/ML Solostar Pen 30units in am and 35unit at night  Further adjustment per your pcp and endocrinology Goal of a1c is less than 7%, currently your a1c is 10%, not at goal (Patient taking differently: Inject 65 Units into the skin daily. Further adjustment per your pcp and endocrinology Goal of a1c is less than 7%, currently your a1c is 10%, not at goal) 15 mL 0   Insulin  Pen Needle (NOVOFINE) 30G X 8 MM MISC Inject 10 each into the skin as needed. 100 each 0   metoprolol  tartrate (LOPRESSOR ) 100 MG tablet Take 50 mg by mouth in the morning.     Multiple Vitamin  (QUINTABS) TABS Take 1 tablet by mouth daily.     naproxen sodium (ALEVE) 220 MG tablet Take 220 mg by mouth daily as needed (headache).     nitroGLYCERIN  (NITROSTAT ) 0.4 MG SL tablet Place 1 tablet (0.4 mg total) under the tongue every 5 (five) minutes as needed for chest pain. 30 tablet 0   potassium chloride  SA (KLOR-CON  M) 20 MEQ tablet Take 1 tablet (20 mEq total) by mouth daily. (Patient taking differently: Take 20 mEq by mouth daily after lunch.) 90 tablet 1   predniSONE  (  DELTASONE ) 10 MG tablet Take 40 mg daily for 1 day, 30 mg daily for 1 day, 20 mg daily for 1 days,10 mg daily for 1 day, then stop 10 tablet 0   ranolazine  (RANEXA ) 500 MG 12 hr tablet Take 500 mg by mouth 2 (two) times daily.     sacubitril -valsartan  (ENTRESTO ) 24-26 MG Take 1 tablet by mouth 2 (two) times daily. 60 tablet 0   sertraline  (ZOLOFT ) 100 MG tablet Take 150 mg by mouth daily.     tamsulosin  (FLOMAX ) 0.4 MG CAPS capsule Take 0.4 mg by mouth daily.     thiamine  (VITAMIN B-1) 100 MG tablet Take 1 tablet (100 mg total) by mouth daily. 30 tablet 0   No current facility-administered medications for this visit.    Family History  Problem Relation Age of Onset   Other Neg Hx     Social History   Socioeconomic History   Marital status: Married    Spouse name: Not on file   Number of children: Not on file   Years of education: Not on file   Highest education level: Not on file  Occupational History   Occupation: Retired  Tobacco Use   Smoking status: Every Day    Current packs/day: 0.25    Average packs/day: 0.3 packs/day for 52.5 years (13.1 ttl pk-yrs)    Types: Cigarettes    Start date: 21   Smokeless tobacco: Never   Tobacco comments:    3 cigarettes a day  Vaping Use   Vaping status: Never Used  Substance and Sexual Activity   Alcohol  use: Yes    Alcohol /week: 6.0 standard drinks of alcohol     Types: 6 Cans of beer per week    Comment: etoh abuse   Drug use: Not Currently   Sexual  activity: Not on file  Other Topics Concern   Not on file  Social History Narrative   Retired-former Hotel manager,    Residential painting   Married   Lives with wife   Right handed   Caffeine-occasionally   Social Drivers of Health   Financial Resource Strain: Not on file  Food Insecurity: No Food Insecurity (03/18/2023)   Hunger Vital Sign    Worried About Running Out of Food in the Last Year: Never true    Ran Out of Food in the Last Year: Never true  Transportation Needs: No Transportation Needs (03/18/2023)   PRAPARE - Administrator, Civil Service (Medical): No    Lack of Transportation (Non-Medical): No  Recent Concern: Transportation Needs - Unmet Transportation Needs (03/18/2023)   PRAPARE - Administrator, Civil Service (Medical): Yes    Lack of Transportation (Non-Medical): Yes  Physical Activity: Not on file  Stress: Not on file  Social Connections: Unknown (07/02/2021)   Received from [facility]   Social Network    Social Network: Not on file  Intimate Partner Violence: Not At Risk (03/18/2023)   Humiliation, Afraid, Rape, and Kick questionnaire    Fear of Current or Ex-Partner: No    Emotionally Abused: No    Physically Abused: No    Sexually Abused: No     REVIEW OF SYSTEMS:  *** [X]  denotes positive finding, [ ]  denotes negative finding Cardiac  Comments:  Chest pain or chest pressure:    Shortness of breath upon exertion:    Short of breath when lying flat:    Irregular heart rhythm:        Vascular  Pain in calf, thigh, or hip brought on by ambulation:    Pain in feet at night that wakes you up from your sleep:     Blood clot in your veins:    Leg swelling:         Pulmonary    Oxygen at home:    Productive cough:     Wheezing:         Neurologic    Sudden weakness in arms or legs:     Sudden numbness in arms or legs:     Sudden onset of difficulty speaking or slurred speech:    Temporary loss of vision in one eye:      Problems with dizziness:         Gastrointestinal    Blood in stool:     Vomited blood:         Genitourinary    Burning when urinating:     Blood in urine:        Psychiatric    Major depression:         Hematologic    Bleeding problems:    Problems with blood clotting too easily:        Skin    Rashes or ulcers:        Constitutional    Fever or chills:      PHYSICAL EXAMINATION:  ***  General:  WDWN in NAD; vital signs documented above Gait: Not observed HENT: WNL, normocephalic Pulmonary: normal non-labored breathing , without wheezing Cardiac: {Desc; regular/irreg:14544} HR, {With/Without:20273} carotid bruit*** Abdomen: soft, NT; aortic pulse is *** palpable Skin: {With/Without:20273} rashes Vascular Exam/Pulses:  Right Left  Radial {Exam; arterial pulse strength 0-4:30167} {Exam; arterial pulse strength 0-4:30167}  Femoral {Exam; arterial pulse strength 0-4:30167} {Exam; arterial pulse strength 0-4:30167}  Popliteal {Exam; arterial pulse strength 0-4:30167} {Exam; arterial pulse strength 0-4:30167}  DP {Exam; arterial pulse strength 0-4:30167} {Exam; arterial pulse strength 0-4:30167}  PT {Exam; arterial pulse strength 0-4:30167} {Exam; arterial pulse strength 0-4:30167}  Peroneal *** ***   Extremities: {With/Without:20273} ischemic changes, {With/Without:20273} Gangrene , {With/Without:20273} cellulitis; {With/Without:20273} open wounds Musculoskeletal: no muscle wasting or atrophy  Neurologic: A&O X 3 Psychiatric:  The pt has {Desc; normal/abnormal:11317::Normal} affect.   Non-Invasive Vascular Imaging:   ABI's/TBI's on 08/20/2023: Right:  BKA Left:  *** - Great toe pressure: ***  Arterial duplex on 08/20/2023: ***  Previous ABI's/TBI's on 04/17/2022: Right:  BKA Left:  Morrill/0.71 - Great toe pressure:  79  Previous arterial duplex on 04/17/2022: +-----------+--------+-----+--------+----------+--------+  LEFT      PSV cm/sRatioStenosisWaveform   Comments  +-----------+--------+-----+--------+----------+--------+  ATA Distal 81                   monophasic          +-----------+--------+-----+--------+----------+--------+  PTA Distal 43                   monophasic          +-----------+--------+-----+--------+----------+--------+  PERO Distal                               NV        +-----------+--------+-----+--------+----------+--------+     Left Stent(s):  +---------------------+--------+--------+----------+--------+  SFA / Popliteal stentPSV cm/sStenosisWaveform  Comments  +---------------------+--------+--------+----------+--------+  Prox to Stent        119             monophasicbrisk     +---------------------+--------+--------+----------+--------+  Proximal Stent       100             monophasicbrisk     +---------------------+--------+--------+----------+--------+  Mid Stent            77              monophasicbrisk     +---------------------+--------+--------+----------+--------+  Distal Stent         111             monophasicbrisk     +---------------------+--------+--------+----------+--------+  Distal to Stent      92              monophasicbrisk     +---------------------+--------+--------+----------+--------+   Summary:  Left: The left SFA / popliteal stent appears patent with no visualized  stenosis. The left external artery stent appears patent.     ASSESSMENT/PLAN:: 63 y.o. 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 Eliza.  On 12/02/2020, he underwent angiogram with left SFA and AK popliteal artery and left EIA stenting by Dr. Magda for ulceration.  He underwent left 3rd toe amputation at [facility] on 07/26/2023.    -*** -continue *** -discussed importance of increased walking daily -pt will f/u in *** with ***.   Lucie Apt, [facility] [phone]  Clinic MD:   Magda

## 2023-08-30 ENCOUNTER — Ambulatory Visit (INDEPENDENT_AMBULATORY_CARE_PROVIDER_SITE_OTHER): Admitting: Podiatry

## 2023-08-30 DIAGNOSIS — Z89421 Acquired absence of other right toe(s): Secondary | ICD-10-CM

## 2023-08-30 NOTE — Progress Notes (Signed)
 Subjective:  Patient ID: Ricardo Howard, male    DOB: 05-25-1960,  MRN: [id number]  Chief Complaint  Patient presents with   History of amputation of right third toe    Suture removal     Procedure: Patient presents with left third digit amputation.  He states he had it done at the [name] at an outside facility.  This was done 3 weeks ago on July 26, 2023.  He is here for follow-up.  He states is doing well denies any other acute complaints  63 y.o. male returns for post-op check.  He states is doing okay bandages clean dry and intact no acute complication.  He is wearing his regular shoes.  I discussed with him to go to surgical shoe he states understanding his BKA on the right side  Review of Systems: Negative except as noted in the HPI. Denies N/V/F/Ch.  Past Medical History:  Diagnosis Date   Alcohol  abuse 06/14/2019   Anxiety    Back pain    Cataracts, bilateral    CHF (congestive heart failure) (HCC)    Depression    Diabetic retinopathy (HCC)    Essential hypertension 06/14/2019   Hepatitis C    cured   Hx of BKA, right (HCC)    Hyperlipidemia    Hypertension    Mixed hyperlipidemia due to type 2 diabetes mellitus (HCC) 07/31/2019   Nicotine  dependence, cigarettes, uncomplicated 07/31/2019   PAD (peripheral artery disease) (HCC)    Pancreatitis    PTSD (post-traumatic stress disorder)    Uncontrolled type 2 diabetes mellitus with diabetic polyneuropathy, with long-term current use of insulin  07/31/2019    Current Outpatient Medications:    acetaminophen  (TYLENOL ) 325 MG tablet, Take 2 tablets (650 mg total) by mouth every 6 (six) hours as needed for mild pain (or Fever >/= 101)., Disp: , Rfl:    albuterol  (VENTOLIN  HFA) 108 (90 Base) MCG/ACT inhaler, Inhale 2 puffs into the lungs every 6 (six) hours as needed for wheezing or shortness of breath., Disp: 6.7 g, Rfl: 1   aspirin  EC 81 MG tablet, Take 81 mg by mouth in the morning., Disp: , Rfl:    [Paused] atorvastatin   (LIPITOR ) 80 MG tablet, Take 1 tablet (80 mg total) by mouth at bedtime., Disp: 90 tablet, Rfl: 3   Bempedoic Acid  (NEXLETOL ) 180 MG TABS, Take 1 tablet (180 mg total) by mouth daily., Disp: 90 tablet, Rfl: 3   budesonide -formoterol  (SYMBICORT ) 160-4.5 MCG/ACT inhaler, Inhale 2 puffs into the lungs 2 (two) times daily., Disp: 10.2 each, Rfl: 1   buPROPion  (WELLBUTRIN  SR) 150 MG 12 hr tablet, Take 150 mg by mouth 2 (two) times daily., Disp: , Rfl:    clopidogrel  (PLAVIX ) 75 MG tablet, Take 75 mg by mouth in the morning., Disp: , Rfl:    empagliflozin  (JARDIANCE ) 25 MG TABS tablet, Take 1 tablet (25 mg total) by mouth daily. (Patient taking differently: Take 12.5 mg by mouth daily.), Disp: 90 tablet, Rfl: 3   finasteride  (PROSCAR ) 5 MG tablet, Take 5 mg by mouth in the morning., Disp: , Rfl:    folic acid  (FOLVITE ) 1 MG tablet, Take 1 tablet (1 mg total) by mouth daily. (Patient taking differently: Take 1 mg by mouth in the morning.), Disp: , Rfl:    furosemide  (LASIX ) 40 MG tablet, Take 1 tablet (40 mg total) by mouth daily. (Patient taking differently: Take 40 mg by mouth in the morning. Take 1 tablet (40 mg total) by mouth daily.), Disp:  94 tablet, Rfl: 0   gabapentin  (NEURONTIN ) 300 MG capsule, Take 300 mg by mouth 3 (three) times daily. Take 1 capsule (300 mg) by mouth in the morning, take 1 capsule (300 mg) by mouth at lunch, & take 2 capsules (600 mg) by mouth at bedtime, Disp: , Rfl:    guaiFENesin  (MUCINEX ) 600 MG 12 hr tablet, Take 1 tablet (600 mg total) by mouth 2 (two) times daily., Disp: 60 tablet, Rfl: 0   insulin  aspart (NOVOLOG ) 100 UNIT/ML injection, Inject 10 Units into the skin 3 (three) times daily with meals., Disp: , Rfl:    insulin  glargine (LANTUS ) 100 UNIT/ML Solostar Pen, 30units in am and 35unit at night  Further adjustment per your pcp and endocrinology Goal of a1c is less than 7%, currently your a1c is 10%, not at goal (Patient taking differently: Inject 65 Units into the skin  daily. Further adjustment per your pcp and endocrinology Goal of a1c is less than 7%, currently your a1c is 10%, not at goal), Disp: 15 mL, Rfl: 0   Insulin  Pen Needle (NOVOFINE) 30G X 8 MM MISC, Inject 10 each into the skin as needed., Disp: 100 each, Rfl: 0   metoprolol  tartrate (LOPRESSOR ) 100 MG tablet, Take 50 mg by mouth in the morning., Disp: , Rfl:    Multiple Vitamin (QUINTABS) TABS, Take 1 tablet by mouth daily., Disp: , Rfl:    naproxen sodium (ALEVE) 220 MG tablet, Take 220 mg by mouth daily as needed (headache)., Disp: , Rfl:    nitroGLYCERIN  (NITROSTAT ) 0.4 MG SL tablet, Place 1 tablet (0.4 mg total) under the tongue every 5 (five) minutes as needed for chest pain., Disp: 30 tablet, Rfl: 0   potassium chloride  SA (KLOR-CON  M) 20 MEQ tablet, Take 1 tablet (20 mEq total) by mouth daily. (Patient taking differently: Take 20 mEq by mouth daily after lunch.), Disp: 90 tablet, Rfl: 1   predniSONE  (DELTASONE ) 10 MG tablet, Take 40 mg daily for 1 day, 30 mg daily for 1 day, 20 mg daily for 1 days,10 mg daily for 1 day, then stop, Disp: 10 tablet, Rfl: 0   ranolazine  (RANEXA ) 500 MG 12 hr tablet, Take 500 mg by mouth 2 (two) times daily., Disp: , Rfl:    sacubitril -valsartan  (ENTRESTO ) 24-26 MG, Take 1 tablet by mouth 2 (two) times daily., Disp: 60 tablet, Rfl: 0   sertraline  (ZOLOFT ) 100 MG tablet, Take 150 mg by mouth daily., Disp: , Rfl:    tamsulosin  (FLOMAX ) 0.4 MG CAPS capsule, Take 0.4 mg by mouth daily., Disp: , Rfl:    thiamine  (VITAMIN B-1) 100 MG tablet, Take 1 tablet (100 mg total) by mouth daily., Disp: 30 tablet, Rfl: 0  Social History   Tobacco Use  Smoking Status Every Day   Current packs/day: 0.25   Average packs/day: 0.3 packs/day for 52.5 years (13.1 ttl pk-yrs)   Types: Cigarettes   Start date: 1973  Smokeless Tobacco Never  Tobacco Comments   3 cigarettes a day    Allergies  Allergen Reactions   Iohexol  Hives    Patient broke out in hives after injection of  Omni 300, will need 13 hour pre-med in future   Ozempic (0.25 Or 0.5 Mg-Dose) [Semaglutide(0.25 Or 0.5mg -Dos)] Other (See Comments)    pancreatitis   Shellfish Allergy Hives   Milk (Cow) Diarrhea    Finding of gastrointestinal tract gas   Zestril [Lisinopril] Cough   Zocor [Simvastatin] Itching and Cough   Objective:  There were  no vitals filed for this visit. There is no height or weight on file to calculate BMI. Constitutional Well developed. Well nourished.  Vascular Foot warm and well perfused. Capillary refill normal to all digits.   Neurologic Normal speech. Oriented to person, place, and time. Epicritic sensation to light touch grossly present left side  Dermatologic Skin completely epithelialized.  No further signs of Deis is noted amputation site noted.  Orthopedic: Now tenderness to palpation noted about the surgical site.   Radiographs: None Assessment:   No diagnosis found.  Plan:  Patient was evaluated and treated and all questions answered.  S/p foot surgery right - Clinically healed and officially discharged from our care at this time no signs of dehiscence noted-reported ankle issues on future he will come back and see me.  He states understanding  No follow-ups on file.

## 2023-09-11 ENCOUNTER — Ambulatory Visit: Admitting: Cardiovascular Disease

## 2023-09-11 DIAGNOSIS — J449 Chronic obstructive pulmonary disease, unspecified: Secondary | ICD-10-CM | POA: Diagnosis not present

## 2023-09-11 DIAGNOSIS — G4733 Obstructive sleep apnea (adult) (pediatric): Secondary | ICD-10-CM | POA: Diagnosis not present

## 2023-09-11 DIAGNOSIS — I259 Chronic ischemic heart disease, unspecified: Secondary | ICD-10-CM | POA: Diagnosis not present

## 2023-09-11 DIAGNOSIS — I739 Peripheral vascular disease, unspecified: Secondary | ICD-10-CM | POA: Diagnosis not present

## 2023-09-11 NOTE — Progress Notes (Signed)
 [facility] Pulmonary and Sleep Medicine outpatient telehealth visit follow-up note  Location Information: Patient State (at time of visit): [place]  Patient Location (at time of visit):Home/Other Non-Medical  Provider Location: Non-Provider-Based Clinic (Clinic, non-hospital) Is provider licensed to provide clinical care in the current location/state of the patient? Yes   Consent:  Patient's identity was confirmed. Presenting condition or illness was discussed with the patient/personal representative. Current proposed treatment for presenting condition or illness was explained to patient/personal representative along with the likely benefits and any significant risks or complications associated with the provision of treatment by audio/video means. The patient/personal representative verbally authorized treatment to be provided by audio/video, which may include a limited review of patient's current health status, medication, or other treatment recommendations, patient education, and an opportunity to ask questions about condition and treatment. Verbal Consent Granted by Patient/Personal Representative:Yes   Visit Information: Modality: Audio-Only  Time Spent on Phone w/ Patient: 22 min     Name: Ricardo Howard MRN: [id number] DOB: Apr 13, 1960  Date of admit: (Not on file) Date of consult: 07/30/23 Referring provider: Dr. Pascual  Chief complaint: AHRF   Assessment/Plan  63 year old male with osteomyelitis surgery complicated by acute respiratory failure with hypoxia required intubation, was recently hospitalized last month where he was seen by me and Dr. Alaine, called for telehealth appointment today for outpatient follow-up has not been to our office before-history of COPD alcohol , cocaine abuse,  Acute hypoxic respiratory failure ARDS Acute pulmonary edema OSA on CPAP COPD   Patient was diuresed while in the hospital with improvement in breathing we  recommended outpatient follow-up I discussed moving forward with OSA reevaluation and obtaining full PFTs discussed proper bronchodilator use make sure he abstains from alcohol  given underlying hepatitis C has a low oncotic pressure and high risk for pulmonary edema  #1 COPD/chronic hypoxic respiratory failure -Currently on supplemental O2 at 2-4lpm O2 confirm saturations around 94%.  - Confirmed he is using bronchodilators appropriately, discussed getting full PFTs before follow-up, is on Symbicort  and Spiriva  #2 OSA - Patient mentions that he used to have a CPAP machine in the past and therefore stopped using it does not have it anymore, in the past was seen as Ricardo Howard with [facility] .  Last seen by them October 2022 has had sleep apnea for more than 10 years but tells me he does not use the device and does not know where his machine is therefore we need to retest him to get him back on track  RTC after PFTs, sleep study I have personally spent 55  minutes involved in face-to-face and non-face-to-face activities for this patient on the day of the visit.  Professional time spent includes the following activities, in addition to those noted in the documentation:  Ricardo Tisa Campanile, MD    History of Present Illness  Ricardo Howard is a 63 y.o. male w/PMH COPD, OSA (CPAP), CAD, polysubstance use, HTN. The patient was initially str[facility]ferred from [facility] for 6/5 for AHRF. Prior to this, he was admitted to the [name] on 5/23 for depression, then was found to have osteomyelitis of her L third toe on 5/29 requiring surgical intervention. During his hospital course, he continued to decompensate and developted AHRF requiring transfer to our hospital on 6/5. He was subsequently intubated and required pressors for septic shock. On 6/7 he was able to be extubated to opti-flow and has since been diuresed to optimize fluid status. He then underwent CTA  PE for ongoing hypoxia. CT was negative  for PE but did show mild cardiomegaly, small b/l effusions, widespread ground-glass opacities that were not quite symmetric bilaterally, raising concern for potential ARDS in a critically ill patient. There was also c/f vasculitis in the setting of AKI with hematuria as well. We were consulted for assessment of AHRF.  While in the hospital he improved with diuresis eventually discharged with oxygen and tells me he has been using his oxygen appropriately has triple agent bronchodilators at home I discussed the importance of looking into OSA and retesting him for this he tells me he had the diagnosis more than 10 years ago with a sleep study at the [name] and lately was seeing [facility], does not have a machine at home and is interested in retesting  Past Medical History  COPD, OSA (CPAP), CAD, polysubstance use, HTN  Studies  CTA PE 6/9 - mild cardiomegaly, small b/l effusions, b/l ground-glass opacities CXR 6/5 - mild b/l opacities c/f edema vs pneumonia CT chest low dose 05/31/23 - mild centrilobular emphysema with air trapping, 5.68mm nodule  Subjective  Weaned to 5L [place] this AM, overall seems to be improving during hospital course  Labs   Lab Results  Component Value Date   GLUCOSE 395 (H) 08/13/2023   CALCIUM  8.5 (L) 08/13/2023   NA 138 08/13/2023   K 4.6 08/13/2023   CO2 27 08/13/2023   CL 101 08/13/2023   BUN 26 (H) 08/13/2023   CREATININE 1.81 (H) 08/13/2023     Lab Results  Component Value Date   ALT 51 07/25/2023   ALT 51 07/25/2023   AST 58 (H) 07/25/2023   AST 58 (H) 07/25/2023   GGT 100 (H) 06/05/2022   BILITOT 0.5 07/25/2023   BILITOT 0.5 07/25/2023    Lab Results  Component Value Date   WBC 10.30 08/06/2023   HGB 13.7 (L) 08/06/2023   HCT 41.4 (L) 08/06/2023   MCV 91.9 08/06/2023   PLT 279 08/06/2023    Discussion  Patient has had a complicated hospital course with MDD, osteomyelitis s/p surgery and subsequent AHRF requiring intubation on 6/5. He does  have an underlying hx of OSA and COPD, and imaging here demonstrated bilateral ground glass opacities which could reflect edema but given some asymmetry need to also rule out ARDS. He also has complex renal disease currently with hematuria and proteinuria, should also rule out vasculitis-type processes that could relate to both renal and pulmonary disease. Upon looking at the images and patient, we would favor more pulmonary edema instead of ARDS for now. Agree with ongoing diuresis to optimize his volume status. Vasculitis workup is negative so far, however still pending several labs including anti-centromere, scl-70, anti-smith, ANA, ANCA, CCP, anti-GBM, HCV, , histoplasma  Assessment/Plan  Acute hypoxic respiratory failure Pulmonary edema OSA on CPAP COPD AKI, rule out vasculitis - Will follow up vasculitis workup:anti-centromere, scl-70, anti-smith, ANA, ANCA, CCP, anti-GBM, HCV, , histoplasma  - Favoring pulmonary edema over ARDS process. Would do aggressive diuresis, per primary team - Wean O2 as tolerated, goal sats >88% - C/w pulmicor, performist, and yupelri for COPD - Agree with low sodium diet and fluid restriction  - We will continue to follow   Allergies  Iohexol , Lisinopril, and Simvastatin  Medications  No current facility-administered medications for this visit.  Review of Systems  14 point ROS neg other than HPI   Social history  He  reports that he has been smoking cigarettes. He has  never used smokeless tobacco. He reports current alcohol  use of about 42.0 standard drinks of alcohol  per week.  Past Surgical History  His  has a past surgical history that includes Coronary angioplasty with stent; Liver biopsy; and Colonoscopy w/ polypectomy.  Family History  His family history includes Cancer in his father and mother; Diabetes in his father and mother; Hypertension in his brother, mother, and sister; Kidney cancer in his sister.   Signature  Ricardo Tisa Campanile,  MD 09/11/2023, 10:03 AM  [address]   [address]   [address] [place], [place] [address] [place], [place] [address]  [place], [place] [address] [phone]  [phone]  [phone]

## 2023-10-02 ENCOUNTER — Ambulatory Visit (HOSPITAL_COMMUNITY): Admission: RE | Admit: 2023-10-02 | Source: Ambulatory Visit

## 2023-10-02 ENCOUNTER — Ambulatory Visit (HOSPITAL_COMMUNITY): Attending: Physician Assistant

## 2023-10-02 ENCOUNTER — Ambulatory Visit: Attending: Physician Assistant

## 2024-02-20 ENCOUNTER — Emergency Department (HOSPITAL_COMMUNITY)

## 2024-02-20 ENCOUNTER — Encounter (HOSPITAL_COMMUNITY): Payer: Self-pay

## 2024-02-20 ENCOUNTER — Other Ambulatory Visit: Payer: Self-pay

## 2024-02-20 ENCOUNTER — Emergency Department (HOSPITAL_COMMUNITY): Admission: EM | Admit: 2024-02-20 | Discharge: 2024-02-21 | Discharge status: Against Medical Advice

## 2024-02-20 DIAGNOSIS — Z5321 Procedure and treatment not carried out due to patient leaving prior to being seen by health care provider: Secondary | ICD-10-CM | POA: Diagnosis not present

## 2024-02-20 DIAGNOSIS — M79672 Pain in left foot: Secondary | ICD-10-CM | POA: Diagnosis present

## 2024-02-20 LAB — BASIC METABOLIC PANEL WITH GFR
Anion gap: 13 (ref 5–15)
BUN: 21 mg/dL (ref 8–23)
CO2: 22 mmol/L (ref 22–32)
Calcium: 9.6 mg/dL (ref 8.9–10.3)
Chloride: 105 mmol/L (ref 98–111)
Creatinine, Ser: 0.98 mg/dL (ref 0.61–1.24)
GFR, Estimated: >60 mL/min
Glucose, Bld: 111 mg/dL — ABNORMAL HIGH (ref 70–99)
Potassium: 4.4 mmol/L (ref 3.5–5.1)
Sodium: 139 mmol/L (ref 135–145)

## 2024-02-20 LAB — CBC WITH DIFFERENTIAL/PLATELET
Abs Immature Granulocytes: 0.04 K/uL (ref 0.00–0.07)
Basophils Absolute: 0 K/uL (ref 0.0–0.1)
Basophils Relative: 1 %
Eosinophils Absolute: 0.1 K/uL (ref 0.0–0.5)
Eosinophils Relative: 2 %
HCT: 43.7 % (ref 39.0–52.0)
Hemoglobin: 14.1 g/dL (ref 13.0–17.0)
Immature Granulocytes: 1 %
Lymphocytes Relative: 29 %
Lymphs Abs: 1.9 K/uL (ref 0.7–4.0)
MCH: 30.4 pg (ref 26.0–34.0)
MCHC: 32.3 g/dL (ref 30.0–36.0)
MCV: 94.2 fL (ref 80.0–100.0)
Monocytes Absolute: 0.6 K/uL (ref 0.1–1.0)
Monocytes Relative: 9 %
Neutro Abs: 3.9 K/uL (ref 1.7–7.7)
Neutrophils Relative %: 58 %
Platelets: 161 K/uL (ref 150–400)
RBC: 4.64 MIL/uL (ref 4.22–5.81)
RDW: 19.4 % — ABNORMAL HIGH (ref 11.5–15.5)
WBC: 6.7 K/uL (ref 4.0–10.5)
nRBC: 0 % (ref 0.0–0.2)

## 2024-02-20 NOTE — ED Triage Notes (Signed)
 Pt arrived POV c/o left foot pain due to a wound that he has. Pt states he first noticed the wound a month ago but the pain started a week ago.

## 2024-02-20 NOTE — ED Notes (Signed)
 Pt called ride to leave. Pt left ED.

## 2024-02-20 NOTE — ED Provider Triage Note (Signed)
 Emergency Medicine Provider Triage Evaluation Note  Ponciano Shealy , a 64 y.o. male  was evaluated in triage.  Pt complains of left heel wound that he states will not heal.  Patient reports the wound has been present for the last couple of months and it has not gone away.  Patient states he is a type II diabetic and does check his blood sugars regularly.  He denies any fevers, chills, nausea, vomiting, chest pain or shortness of breath.  Review of Systems  Positive: Foot wound Negative: Fevers, chills  Physical Exam  BP (!) 151/115 (BP Location: Left Arm)   Pulse 91   Temp (!) 97.5 F (36.4 C)   Resp 18   Ht 5' 8 (1.727 m)   Wt 88.5 kg   SpO2 96%   BMI 29.65 kg/m  Gen:   Awake, no distress   Resp:  Normal effort  MSK:   Moves extremities without difficulty  Other:  Small wound noted to left heel.  No obvious necrosis or erythema.  No pus or drainage.  Medical Decision Making  Medically screening exam initiated at 4:25 PM.  Appropriate orders placed.  [name] was informed that the remainder of the evaluation will be completed by another provider, this initial triage assessment does not replace that evaluation, and the importance of remaining in the ED until their evaluation is complete.  Labs and x-ray ordered.   Torrence Marry RAMAN, PA-C 02/20/24 1629

## 2024-02-24 NOTE — Progress Notes (Signed)
 Ricardo Howard                                          MRN: [id number]   02/24/2024   The [facility] Quality Team Specialist reviewed this patient medical record for the purposes of chart review for care gap closure. The following were reviewed: chart review for care gap closure-glycemic status assessment.    [facility] Quality Team

## 2024-03-17 ENCOUNTER — Other Ambulatory Visit: Payer: Self-pay

## 2024-03-17 DIAGNOSIS — I739 Peripheral vascular disease, unspecified: Secondary | ICD-10-CM

## 2024-03-18 ENCOUNTER — Emergency Department (HOSPITAL_COMMUNITY)
Admission: EM | Admit: 2024-03-18 | Discharge: 2024-03-18 | Disposition: A | Discharge status: Home / Self Care | Attending: Emergency Medicine | Admitting: Emergency Medicine

## 2024-03-18 ENCOUNTER — Encounter (HOSPITAL_COMMUNITY): Payer: Self-pay

## 2024-03-18 ENCOUNTER — Ambulatory Visit: Admitting: Internal Medicine

## 2024-03-18 ENCOUNTER — Emergency Department (HOSPITAL_COMMUNITY)

## 2024-03-18 ENCOUNTER — Other Ambulatory Visit: Payer: Self-pay

## 2024-03-18 DIAGNOSIS — Z4800 Encounter for change or removal of nonsurgical wound dressing: Secondary | ICD-10-CM | POA: Insufficient documentation

## 2024-03-18 DIAGNOSIS — Z7982 Long term (current) use of aspirin: Secondary | ICD-10-CM | POA: Insufficient documentation

## 2024-03-18 DIAGNOSIS — Z7902 Long term (current) use of antithrombotics/antiplatelets: Secondary | ICD-10-CM | POA: Insufficient documentation

## 2024-03-18 DIAGNOSIS — Z794 Long term (current) use of insulin: Secondary | ICD-10-CM | POA: Insufficient documentation

## 2024-03-18 DIAGNOSIS — E1140 Type 2 diabetes mellitus with diabetic neuropathy, unspecified: Secondary | ICD-10-CM | POA: Insufficient documentation

## 2024-03-18 DIAGNOSIS — Z5189 Encounter for other specified aftercare: Secondary | ICD-10-CM

## 2024-03-18 LAB — CBC WITH DIFFERENTIAL/PLATELET
Abs Immature Granulocytes: 0.05 10*3/uL (ref 0.00–0.07)
Basophils Absolute: 0 10*3/uL (ref 0.0–0.1)
Basophils Relative: 0 %
Eosinophils Absolute: 0.1 10*3/uL (ref 0.0–0.5)
Eosinophils Relative: 1 %
HCT: 46.6 % (ref 39.0–52.0)
Hemoglobin: 15.4 g/dL (ref 13.0–17.0)
Immature Granulocytes: 1 %
Lymphocytes Relative: 16 %
Lymphs Abs: 1.5 10*3/uL (ref 0.7–4.0)
MCH: 31.6 pg (ref 26.0–34.0)
MCHC: 33 g/dL (ref 30.0–36.0)
MCV: 95.5 fL (ref 80.0–100.0)
Monocytes Absolute: 0.4 10*3/uL (ref 0.1–1.0)
Monocytes Relative: 4 %
Neutro Abs: 7.3 10*3/uL (ref 1.7–7.7)
Neutrophils Relative %: 78 %
Platelets: 203 10*3/uL (ref 150–400)
RBC: 4.88 MIL/uL (ref 4.22–5.81)
RDW: 17.6 % — ABNORMAL HIGH (ref 11.5–15.5)
WBC: 9.3 10*3/uL (ref 4.0–10.5)
nRBC: 0 % (ref 0.0–0.2)

## 2024-03-18 LAB — BASIC METABOLIC PANEL WITH GFR
Anion gap: 15 (ref 5–15)
BUN: 19 mg/dL (ref 8–23)
CO2: 24 mmol/L (ref 22–32)
Calcium: 9.3 mg/dL (ref 8.9–10.3)
Chloride: 101 mmol/L (ref 98–111)
Creatinine, Ser: 0.98 mg/dL (ref 0.61–1.24)
GFR, Estimated: >60 mL/min
Glucose, Bld: 206 mg/dL — ABNORMAL HIGH (ref 70–99)
Potassium: 4.5 mmol/L (ref 3.5–5.1)
Sodium: 140 mmol/L (ref 135–145)

## 2024-03-18 NOTE — ED Provider Triage Note (Signed)
 Emergency Medicine Provider Triage Evaluation Note  Ricardo Howard , a 64 y.o. male  was evaluated in triage.  Pt complains of L heel wound.  Review of Systems  Positive: L heel wound Negative:   Physical Exam  BP (!) 116/105   Pulse 82   Temp 97.9 F (36.6 C) (Oral)   Resp 18   Ht 5' 8 (1.727 m)   Wt 92.5 kg   SpO2 97%   BMI 31.02 kg/m  Gen:   Awake, no distress   Resp:  Normal effort  MSK:   Moves extremities without difficulty  Other:    Medical Decision Making  Medically screening exam initiated at 11:19 AM.  Appropriate orders placed.  Ricardo Howard was informed that the remainder of the evaluation will be completed by another provider, this initial triage assessment does not replace that evaluation, and the importance of remaining in the ED until their evaluation is complete.     Emil Share, DO 03/18/24 1119

## 2024-03-18 NOTE — ED Triage Notes (Signed)
 Pt bib PTAR from home c.o wound to left foot/heel x 3 weeks. Pt states it is now oozing. Pt given ointment to apply but it has gotten worse. Pt in diabetic

## 2024-03-18 NOTE — ED Provider Notes (Signed)
 " [facility] EMERGENCY DEPARTMENT AT [facility] Provider Note   CSN: [id number] Arrival date & time: 03/18/24  1059     Patient presents with: Wound Check   Ricardo Howard is a 64 y.o. male.   The patient is 64 year old male presents for evaluation of left heel wound.  Does have a history of diabetes.  Is seeing wound care at the [name].  Denies any fever or chills.  No drainage from the area.  No red streaks going up his leg.  Scheduled see vascular surgery in a few weeks.  Has a history of peripheral neuropathy.  Has had a right sided amputation above the knee.  Uses a cane and puts a lot of weight on his left foot.  No other complaints at this time       Prior to Admission medications  Medication Sig Start Date End Date Taking? Authorizing Provider  acetaminophen  (TYLENOL ) 325 MG tablet Take 2 tablets (650 mg total) by mouth every 6 (six) hours as needed for mild pain (or Fever >/= 101). 06/20/22   Elgergawy, Brayton RAMAN, MD  albuterol  (VENTOLIN  HFA) 108 (90 Base) MCG/ACT inhaler Inhale 2 puffs into the lungs every 6 (six) hours as needed for wheezing or shortness of breath. 03/21/23   Ghimire, Donalda HERO, MD  aspirin  EC 81 MG tablet Take 81 mg by mouth in the morning.    [provider]  [Paused] atorvastatin  (LIPITOR ) 80 MG tablet Take 1 tablet (80 mg total) by mouth at bedtime. Wait to take this until your doctor or other care provider tells you to start again. 08/30/21   Nishan, Peter C, MD  Bempedoic Acid  (NEXLETOL ) 180 MG TABS Take 1 tablet (180 mg total) by mouth daily. 05/30/23   Swinyer, Rosaline HERO, NP  budesonide -formoterol  (SYMBICORT ) 160-4.5 MCG/ACT inhaler Inhale 2 puffs into the lungs 2 (two) times daily. 03/21/23   Ghimire, Donalda HERO, MD  buPROPion  (WELLBUTRIN  SR) 150 MG 12 hr tablet Take 150 mg by mouth 2 (two) times daily.    [provider]  clopidogrel  (PLAVIX ) 75 MG tablet Take 75 mg by mouth in the morning.    [provider]   empagliflozin  (JARDIANCE ) 25 MG TABS tablet Take 1 tablet (25 mg total) by mouth daily. Patient taking differently: Take 12.5 mg by mouth daily. 08/30/21   Nishan, Peter C, MD  finasteride  (PROSCAR ) 5 MG tablet Take 5 mg by mouth in the morning.    [provider]  folic acid  (FOLVITE ) 1 MG tablet Take 1 tablet (1 mg total) by mouth daily. Patient taking differently: Take 1 mg by mouth in the morning. 10/11/19   Sebastian Toribio GAILS, MD  furosemide  (LASIX ) 40 MG tablet Take 1 tablet (40 mg total) by mouth daily. Patient taking differently: Take 40 mg by mouth in the morning. Take 1 tablet (40 mg total) by mouth daily. 06/23/22   Elgergawy, Brayton RAMAN, MD  gabapentin  (NEURONTIN ) 300 MG capsule Take 300 mg by mouth 3 (three) times daily. Take 1 capsule (300 mg) by mouth in the morning, take 1 capsule (300 mg) by mouth at lunch, & take 2 capsules (600 mg) by mouth at bedtime    [provider]  guaiFENesin  (MUCINEX ) 600 MG 12 hr tablet Take 1 tablet (600 mg total) by mouth 2 (two) times daily. 02/14/23   Krishnan, Gokul, MD  insulin  aspart (NOVOLOG ) 100 UNIT/ML injection Inject 10 Units into the skin 3 (three) times daily with meals. 02/14/23  Krishnan, Gokul, MD  insulin  glargine (LANTUS ) 100 UNIT/ML Solostar Pen 30units in am and 35unit at night  Further adjustment per your pcp and endocrinology Goal of a1c is less than 7%, currently your a1c is 10%, not at goal Patient taking differently: Inject 65 Units into the skin daily. Further adjustment per your pcp and endocrinology Goal of a1c is less than 7%, currently your a1c is 10%, not at goal 06/03/21   Jerri Keys, MD  Insulin  Pen Needle (NOVOFINE) 30G X 8 MM MISC Inject 10 each into the skin as needed. 10/10/19   Sebastian Toribio GAILS, MD  metoprolol  tartrate (LOPRESSOR ) 100 MG tablet Take 50 mg by mouth in the morning.    [provider]  Multiple Vitamin (QUINTABS) TABS Take 1 tablet by mouth daily.    [provider]   naproxen sodium (ALEVE) 220 MG tablet Take 220 mg by mouth daily as needed (headache).    [provider]  nitroGLYCERIN  (NITROSTAT ) 0.4 MG SL tablet Place 1 tablet (0.4 mg total) under the tongue every 5 (five) minutes as needed for chest pain. 07/19/21   Susen Pastor, MD  potassium chloride  SA (KLOR-CON  M) 20 MEQ tablet Take 1 tablet (20 mEq total) by mouth daily. Patient taking differently: Take 20 mEq by mouth daily after lunch. 08/31/21   Bhagat, Bhavinkumar, PA  predniSONE  (DELTASONE ) 10 MG tablet Take 40 mg daily for 1 day, 30 mg daily for 1 day, 20 mg daily for 1 days,10 mg daily for 1 day, then stop 03/21/23   Raenelle Donalda HERO, MD  ranolazine  (RANEXA ) 500 MG 12 hr tablet Take 500 mg by mouth 2 (two) times daily. 09/27/21   [provider]  sacubitril -valsartan  (ENTRESTO ) 24-26 MG Take 1 tablet by mouth 2 (two) times daily. 06/25/22   Elgergawy, Brayton RAMAN, MD  sertraline  (ZOLOFT ) 100 MG tablet Take 150 mg by mouth daily.    [provider]  tamsulosin  (FLOMAX ) 0.4 MG CAPS capsule Take 0.4 mg by mouth daily.    [provider]  thiamine  (VITAMIN B-1) 100 MG tablet Take 1 tablet (100 mg total) by mouth daily. 02/15/23   Krishnan, Gokul, MD    Allergies: Iohexol , Ozempic (0.25 or 0.5 mg-dose) [semaglutide(0.25 or 0.5mg -dos)], Shellfish allergy, Milk (cow), Zestril [lisinopril], and Zocor [simvastatin]    Review of Systems  All other systems reviewed and are negative.   Updated Vital Signs BP (!) 116/105   Pulse 82   Temp 97.9 F (36.6 C) (Oral)   Resp 18   Ht 1.727 m (5' 8)   Wt 92.5 kg   SpO2 97%   BMI 31.02 kg/m   Physical Exam Vitals and nursing note reviewed.  Constitutional:      General: He is not in acute distress.    Appearance: Normal appearance. He is well-developed. He is not toxic-appearing.  HENT:     Head: Normocephalic and atraumatic.  Eyes:     General: Lids are normal.     Conjunctiva/sclera: Conjunctivae normal.      Pupils: Pupils are equal, round, and reactive to light.  Neck:     Thyroid : No thyroid  mass.     Trachea: No tracheal deviation.  Cardiovascular:     Rate and Rhythm: Normal rate and regular rhythm.     Heart sounds: Normal heart sounds. No murmur heard.    No gallop.  Pulmonary:     Effort: Pulmonary effort is normal. No respiratory distress.     Breath sounds:  Normal breath sounds. No stridor. No decreased breath sounds, wheezing, rhonchi or rales.  Abdominal:     General: There is no distension.     Palpations: Abdomen is soft.     Tenderness: There is no abdominal tenderness. There is no rebound.  Musculoskeletal:        General: No tenderness. Normal range of motion.     Cervical back: Normal range of motion and neck supple.       Legs:  Skin:    General: Skin is warm and dry.     Findings: No abrasion or rash.  Neurological:     Mental Status: He is alert and oriented to person, place, and time. Mental status is at baseline.     GCS: GCS eye subscore is 4. GCS verbal subscore is 5. GCS motor subscore is 6.     Cranial Nerves: No cranial nerve deficit.     Sensory: No sensory deficit.     Motor: Motor function is intact.  Psychiatric:        Attention and Perception: Attention normal.        Speech: Speech normal.        Behavior: Behavior normal.     (all labs ordered are listed, but only abnormal results are displayed) Labs Reviewed  CBC WITH DIFFERENTIAL/PLATELET - Abnormal; Notable for the following components:      Result Value   RDW 17.6 (*)    All other components within normal limits  BASIC METABOLIC PANEL WITH GFR - Abnormal; Notable for the following components:   Glucose, Bld 206 (*)    All other components within normal limits    EKG: None  Radiology: DG Foot Complete Left Result Date: 03/18/2024 CLINICAL DATA:  Wound check, foot pain. EXAM: LEFT FOOT - COMPLETE 3+ VIEW COMPARISON:  02/20/2024. 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 Third toe amputation. Soft tissue wound along the heel. No underlying soft tissue gas or osseous erosion to suggest osteomyelitis. Midfoot degenerative changes. IMPRESSION: 1. Soft tissue ulceration along the heel without underlying evidence of osteomyelitis. 2. 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 Electronically Signed   By: Newell Eke M.D.   On: 03/18/2024 12:27     Procedures   Medications Ordered in the ED - No data to display                                  Medical Decision Making  X-ray of right heel shows no evidence of osteomyelitis.  Labs here are reassuring.  Patient follow-up at the [facility]     Final diagnoses:  None    ED Discharge Orders     None          Dasie Faden, MD 03/18/24 1827  "

## 2024-03-18 NOTE — Progress Notes (Unsigned)
 " Cardiology Office Note:    Date:  03/18/2024   ID:  Ricardo Howard, DOB 1960/05/02, MRN [id number]  PCP:  [facility], [facility]  [facility] Cardiologist:  Maude Emmer, MD  [facility] Electrophysiologist:  None   Chief Complaint: medication management   History of Present Illness:    Ricardo Howard is a 64 y.o. male with a hx of CAD, hypertension, hyperlipidemia, diabetes, peripheral vascular disease s/p right BKA, obstructive sleep apnea on CPAP, chronic combined CHF, alcohol  and tobacco use. I have not seen him in 3 years   Admitted 06/2021 for NSTEMI. Cath showed High-grade calcified proximal right coronary artery lesion treated with atherectomy and 1 drug-eluting stent with patent previously placed mid to distal right coronary artery stent and proximal left circumflex stent, the latter with moderate in-stent restenosis. Placed on DAPT with Brilinta /ASA indefinitely give the length of stent and number. Does have moderate disease in the pLAD and LCx to be treated medically.  LVEF of 45-50% with septal hypokinesis mid/basal wall akinesis.  TTE 05/16/23 EF 50%   He missed post hospital follow-up appointment. Has issues with med compliance and refills   Brillinta changed to Plavix  for dyspnea   BS poorly controlled and [facility] He was supposed to start nexlitol 06/2023 in addition to his high dose lipitor .   Hospitalized June 2025 with depression, osteomyelitis of left foot Was intubated and renal failure worse Cr 2.7 Steroid taper. D/c to SNF.   ***    Past Medical History:  Diagnosis Date   Alcohol  abuse 06/14/2019   Anxiety    Back pain    Cataracts, bilateral    CHF (congestive heart failure) (HCC)    Depression    Diabetic retinopathy (HCC)    Essential hypertension 06/14/2019   Hepatitis C    cured   Hx of BKA, right (HCC)    Hyperlipidemia    Hypertension    Mixed hyperlipidemia due to type 2 diabetes mellitus (HCC) 07/31/2019   Nicotine   dependence, cigarettes, uncomplicated 07/31/2019   PAD (peripheral artery disease)    Pancreatitis    PTSD (post-traumatic stress disorder)    Uncontrolled type 2 diabetes mellitus with diabetic polyneuropathy, with long-term current use of insulin  07/31/2019    Past Surgical History:  Procedure Laterality Date   ABDOMINAL AORTOGRAM W/LOWER EXTREMITY N/A 12/02/2020   Procedure: ABDOMINAL AORTOGRAM W/LOWER EXTREMITY;  Surgeon: Magda Debby SAILOR, MD;  Location: [facility];  Service: Cardiovascular;  Laterality: N/A;   AMPUTATION Right 03/13/2020   Procedure: TRANSMETATARSAL AMPUTATION;  Surgeon: Eliza Lonni RAMAN, MD;  Location: [facility] OR;  Service: Vascular;  Laterality: Right;   AMPUTATION Right 03/15/2020   Procedure: RIGHT BELOW KNEE AMPUTATION;  Surgeon: Eliza Lonni RAMAN, MD;  Location: [facility] OR;  Service: Vascular;  Laterality: Right;   CORONARY ANGIOPLASTY WITH STENT PLACEMENT     CORONARY STENT INTERVENTION N/A 07/16/2021   Procedure: CORONARY STENT INTERVENTION;  Surgeon: Wendel Lurena POUR, MD;  Location: [facility];  Service: Cardiovascular;  Laterality: N/A;   LEFT HEART CATH AND CORONARY ANGIOGRAPHY N/A 07/16/2021   Procedure: LEFT HEART CATH AND CORONARY ANGIOGRAPHY;  Surgeon: Wendel Lurena POUR, MD;  Location: [facility];  Service: Cardiovascular;  Laterality: N/A;   PERIPHERAL VASCULAR INTERVENTION  12/02/2020   Procedure: PERIPHERAL VASCULAR INTERVENTION;  Surgeon: Magda Debby SAILOR, MD;  Location: [facility];  Service: Cardiovascular;;  Lt SFA, LEIA    Current Medications: No outpatient  medications have been marked as taking for the 03/19/24 encounter (Appointment) with Tiffay Pinette C, MD.     Allergies:   Iohexol , Ozempic (0.25 or 0.5 mg-dose) [semaglutide(0.25 or 0.5mg -dos)], Shellfish allergy, Milk (cow), Zestril [lisinopril], and Zocor [simvastatin]   Social History   Socioeconomic History   Marital status: Married    Spouse name: Not on  file   Number of children: Not on file   Years of education: Not on file   Highest education level: Not on file  Occupational History   Occupation: Retired  Tobacco Use   Smoking status: Every Day    Current packs/day: 0.25    Average packs/day: 0.3 packs/day for 53.1 years (13.3 ttl pk-yrs)    Types: Cigarettes    Start date: 1973   Smokeless tobacco: Never   Tobacco comments:    3 cigarettes a day  Vaping Use   Vaping status: Never Used  Substance and Sexual Activity   Alcohol  use: Yes    Alcohol /week: 6.0 standard drinks of alcohol     Types: 6 Cans of beer per week    Comment: Drinks 2 quarts a day   Drug use: Not Currently   Sexual activity: Not on file  Other Topics Concern   Not on file  Social History Narrative   Retired-former hotel manager,    Residential painting   Married   Lives with wife   Right handed   Caffeine-occasionally   Social Drivers of Health   Tobacco Use: High Risk (02/20/2024)   Patient History    Smoking Tobacco Use: Every Day    Smokeless Tobacco Use: Never    Passive Exposure: Not on file  Financial Resource Strain: Not on file  Food Insecurity: No Food Insecurity (03/18/2023)   Hunger Vital Sign    Worried About Running Out of Food in the Last Year: Never true    Ran Out of Food in the Last Year: Never true  Transportation Needs: No Transportation Needs (03/18/2023)   PRAPARE - Administrator, Civil Service (Medical): No    Lack of Transportation (Non-Medical): No  Recent Concern: Transportation Needs - Unmet Transportation Needs (03/18/2023)   PRAPARE - Administrator, Civil Service (Medical): Yes    Lack of Transportation (Non-Medical): Yes  Physical Activity: Not on file  Stress: Not on file  Social Connections: Not on file  Depression (PHQ2-9): Low Risk (11/07/2021)   Depression (PHQ2-9)    PHQ-2 Score: 0  Alcohol  Screen: Not on file  Housing: Low Risk (03/18/2023)   Housing Stability Vital Sign    Unable to  Pay for Housing in the Last Year: No    Number of Times Moved in the Last Year: 0    Homeless in the Last Year: No  Utilities: Not At Risk (03/18/2023)   [employer] Utilities    Threatened with loss of utilities: No  Health Literacy: Not on file     Family History: The patient's family history is negative for Other.    ROS:   Please see the history of present illness.    All other systems reviewed and are negative.   EKGs/Labs/Other Studies Reviewed:    The following studies were reviewed today:  Echo 07/16/21:   1. Septal hypokinesis mid/basal wall akinesis . Left ventricular ejection  fraction, by estimation, is 45 to 50%. The left ventricle has mildly  decreased function. The left ventricle demonstrates regional wall motion  abnormalities (see scoring  diagram/findings for description). The  left ventricular internal cavity  size was mildly dilated. There is mild left ventricular hypertrophy.   2. Right ventricular systolic function is normal. The right ventricular  size is normal.   3. The mitral valve is normal in structure. No evidence of mitral valve  regurgitation.   4. The aortic valve is tricuspid. There is moderate calcification of the  aortic valve. There is mild thickening of the aortic valve. Aortic valve  sclerosis/calcification is present, without any evidence of aortic  stenosis.    Cardiac cath 07/16/21:      Mid RCA lesion is 20% stenosed.   Prox RCA lesion is 95% stenosed.   Prox Cx lesion is 40% stenosed.   Mid LAD lesion is 50% stenosed.   A stent was successfully placed.   Post intervention, there is a 0% residual stenosis.   LV end diastolic pressure is mildly elevated.   1.  High-grade calcified proximal right coronary artery lesion treated with atherectomy and 1 drug-eluting stent with patent previously placed mid to distal right coronary artery stent and proximal left circumflex stent, the latter with moderate in-stent restenosis. 2.  Very focal mid  moderate LAD lesion. 3.  LVEDP of 19 mmHg.   Recommendation: Dual antiplatelet therapy for 1 year.   Dominance: Right Intervention         EKG:  EKG is  ordered today.  The ekg ordered today demonstrates sinus tachycardia  Recent Labs: 03/21/2023: Magnesium  1.7 07/07/2023: ALT 50 02/20/2024: BUN 21; Creatinine, Ser 0.98; Hemoglobin 14.1; Platelets 161; Potassium 4.4; Sodium 139  Recent Lipid Panel    Component Value Date/Time   CHOL 249 (H) 07/16/2021 1448   TRIG 58 07/16/2021 1448   HDL 44 07/16/2021 1448   CHOLHDL 5.7 07/16/2021 1448   VLDL 12 07/16/2021 1448   LDLCALC 193 (H) 07/16/2021 1448     Physical Exam:    VS:  There were no vitals taken for this visit.   Repeat BP 100/63 mm Hg   Wt Readings from Last 3 Encounters:  02/20/24 195 lb (88.5 kg)  07/07/23 195 lb (88.5 kg)  05/30/23 203 lb 9.6 oz (92.4 kg)    Affect appropriate Chronically ill black male HEENT: normal Neck supple with no adenopathy JVP normal no bruits no thyromegaly Lungs clear with no wheezing and good diaphragmatic motion Heart:  S1/S2 no murmur, no rub, gallop or click PMI normal Abdomen: benighn, BS positve, no tenderness, no AAA no bruit.  No HSM or HJR Distal pulses intact with no bruits No edema Neuro non-focal Left foot 3 rd toe amputation   ASSESSMENT AND PLAN:    CAD - cath 06/2021 High-grade calcified proximal right coronary artery lesion treated with atherectomy and 1 drug-eluting stent with patent previously placed mid to distal right coronary artery stent and proximal left circumflex stent, the latter with moderate in-stent restenosis -Continue aspirin  -He will continue Plav[facility]  which [facility] (stop Brilinta  sample) -Continue Lipitor  and Zetia  - Continue Imdur  30 mg daily -Continue carvedilol  6.25 mg daily - Note contrast allergy for future reference  2. ICM/acute on chronic combined heart failure - Echo with LVEF of 45-50% with septal hypokinesis mid/basal  wall akinesis - Recent volume overload from compliance/diet issues Lasix    -Continue carvedilol  6.25 mg twice daily -Continue to hold losartan  -Continue Jardiance  -Added potassium supplement with increase Lasix  -Close follow-up next week with repeat blood work -He was noncompliant with salt intake.  At length discussion regarding salt and water  management.  3. HTN -Blood pressure soft -Continue to hold losartan  and lisinopril  -Continue reduced dose of carvedilol  at 6.25 mg twice daily -Continue Imdur  30 mg daily  4. HLD -Continue Zetia  and epidural  5. PAD s/p RBKA Osteomyelitis with left foot  3 rd toe amputation  6. OSA on CPAP  7. Tobacco/Alcohol  use  8.  Sinus tachycardia improved on lopressor    9.  DM:  improved control when compliant F/U Dr Shaunna    Medication Adjustments/Labs and Tests Ordered: Current medicines are reviewed at length with the patient today.  Concerns regarding medicines are outlined above.  No orders of the defined types were placed in this encounter.  No orders of the defined types were placed in this encounter.   There are no Patient Instructions on file for this visit.   Signed, Maude Emmer, MD  03/18/2024 8:38 AM    [facility] "

## 2024-03-18 NOTE — Discharge Instructions (Signed)
 Follow up at the [facility]

## 2024-03-19 ENCOUNTER — Ambulatory Visit: Admitting: Cardiovascular Disease

## 2024-03-24 ENCOUNTER — Other Ambulatory Visit: Payer: Self-pay

## 2024-03-24 ENCOUNTER — Telehealth: Payer: Self-pay

## 2024-03-24 NOTE — Telephone Encounter (Signed)
 Ricardo Howard called regarding non healing wound that is getting more painful. US  appointments moved up from 04/10/24 to 04/01/24.   Appointment date, time and location communicated to Ricardo Howard and understanding was verbalized.

## 2024-03-27 ENCOUNTER — Other Ambulatory Visit: Payer: Self-pay

## 2024-03-27 DIAGNOSIS — I739 Peripheral vascular disease, unspecified: Secondary | ICD-10-CM

## 2024-04-01 ENCOUNTER — Ambulatory Visit (HOSPITAL_COMMUNITY)

## 2024-04-10 ENCOUNTER — Ambulatory Visit (HOSPITAL_COMMUNITY)

## 2024-04-14 ENCOUNTER — Ambulatory Visit: Admitting: Vascular Surgery

## 2024-06-11 ENCOUNTER — Ambulatory Visit: Admitting: Cardiovascular Disease
